# Patient Record
Sex: Female | Born: 1950
Health system: Southern US, Community
[De-identification: ages and names within clinical notes are randomized; demographics above are authoritative.]

## PROBLEM LIST (undated history)

## (undated) DIAGNOSIS — K859 Acute pancreatitis without necrosis or infection, unspecified: Secondary | ICD-10-CM

## (undated) DIAGNOSIS — I10 Essential (primary) hypertension: Secondary | ICD-10-CM

## (undated) DIAGNOSIS — G894 Chronic pain syndrome: Secondary | ICD-10-CM

## (undated) DIAGNOSIS — F329 Major depressive disorder, single episode, unspecified: Secondary | ICD-10-CM

## (undated) DIAGNOSIS — R519 Headache, unspecified: Secondary | ICD-10-CM

## (undated) DIAGNOSIS — R609 Edema, unspecified: Secondary | ICD-10-CM

## (undated) DIAGNOSIS — E559 Vitamin D deficiency, unspecified: Secondary | ICD-10-CM

## (undated) DIAGNOSIS — M545 Low back pain, unspecified: Secondary | ICD-10-CM

## (undated) DIAGNOSIS — I700 Atherosclerosis of aorta: Secondary | ICD-10-CM

## (undated) DIAGNOSIS — R1011 Right upper quadrant pain: Secondary | ICD-10-CM

## (undated) DIAGNOSIS — R5383 Other fatigue: Secondary | ICD-10-CM

## (undated) DIAGNOSIS — J40 Bronchitis, not specified as acute or chronic: Secondary | ICD-10-CM

## (undated) DIAGNOSIS — M797 Fibromyalgia: Secondary | ICD-10-CM

## (undated) DIAGNOSIS — R4182 Altered mental status, unspecified: Secondary | ICD-10-CM

## (undated) DIAGNOSIS — E876 Hypokalemia: Secondary | ICD-10-CM

## (undated) DIAGNOSIS — F419 Anxiety disorder, unspecified: Secondary | ICD-10-CM

## (undated) DIAGNOSIS — G629 Polyneuropathy, unspecified: Secondary | ICD-10-CM

## (undated) DIAGNOSIS — K219 Gastro-esophageal reflux disease without esophagitis: Secondary | ICD-10-CM

## (undated) DIAGNOSIS — J209 Acute bronchitis, unspecified: Secondary | ICD-10-CM

## (undated) DIAGNOSIS — M47816 Spondylosis without myelopathy or radiculopathy, lumbar region: Secondary | ICD-10-CM

## (undated) DIAGNOSIS — R51 Headache: Secondary | ICD-10-CM

## (undated) DIAGNOSIS — F32A Depression, unspecified: Secondary | ICD-10-CM

## (undated) DIAGNOSIS — M25559 Pain in unspecified hip: Secondary | ICD-10-CM

## (undated) DIAGNOSIS — G4700 Insomnia, unspecified: Secondary | ICD-10-CM

## (undated) DIAGNOSIS — G8929 Other chronic pain: Secondary | ICD-10-CM

## (undated) HISTORY — PX: OTHER SURGICAL HISTORY: SHX169

## (undated) HISTORY — DX: Low back pain: M54.5

## (undated) HISTORY — DX: Anxiety disorder, unspecified: F41.9

## (undated) HISTORY — PX: LAPAROSCOPIC VAGINAL HYSTERECTOMY WITH SALPINGO OOPHORECTOMY: SHX6681

## (undated) HISTORY — DX: Acute bronchitis, unspecified: J20.9

## (undated) HISTORY — DX: Other fatigue: R53.83

## (undated) HISTORY — DX: Vitamin D deficiency, unspecified: E55.9

## (undated) HISTORY — PX: BREAST BIOPSY: SHX20

## (undated) HISTORY — DX: Right upper quadrant pain: R10.11

## (undated) HISTORY — PX: ECTOPIC PREGNANCY SURGERY: SHX613

## (undated) HISTORY — PX: ABDOMINAL HYSTERECTOMY: SHX81

## (undated) HISTORY — DX: Fibromyalgia: M79.7

## (undated) HISTORY — DX: Acute pancreatitis without necrosis or infection, unspecified: K85.90

## (undated) HISTORY — DX: Low back pain, unspecified: M54.50

## (undated) HISTORY — DX: Insomnia, unspecified: G47.00

## (undated) HISTORY — DX: Depression, unspecified: F32.A

## (undated) HISTORY — DX: Major depressive disorder, single episode, unspecified: F32.9

## (undated) HISTORY — PX: LUMBAR FUSION: SHX111

## (undated) HISTORY — DX: Edema, unspecified: R60.9

## (undated) HISTORY — DX: Gastro-esophageal reflux disease without esophagitis: K21.9

## (undated) HISTORY — PX: OOPHORECTOMY: SHX86

## (undated) HISTORY — PX: BREAST SURGERY: SHX581

## (undated) HISTORY — DX: Headache, unspecified: R51.9

## (undated) HISTORY — PX: CATARACT EXTRACTION, BILATERAL: SHX1313

## (undated) HISTORY — PX: TOTAL HIP ARTHROPLASTY: SHX124

## (undated) HISTORY — DX: Chronic pain syndrome: G89.4

## (undated) HISTORY — DX: Pain in unspecified hip: M25.559

## (undated) HISTORY — DX: Headache: R51

## (undated) HISTORY — PX: HIP SURGERY: SHX245

## (undated) HISTORY — DX: Essential (primary) hypertension: I10

## (undated) HISTORY — PX: TOTAL HIP REVISION: SHX763

## (undated) HISTORY — DX: Bronchitis, not specified as acute or chronic: J40

## (undated) HISTORY — DX: Other chronic pain: G89.29

## (undated) HISTORY — PX: BACK SURGERY: SHX140

## (undated) HISTORY — DX: Altered mental status, unspecified: R41.82

---

## 2000-01-18 ENCOUNTER — Ambulatory Visit (HOSPITAL_COMMUNITY): Admission: RE | Admit: 2000-01-18 | Discharge: 2000-01-19 | Payer: Self-pay | Admitting: Neurosurgery

## 2000-01-18 ENCOUNTER — Encounter: Payer: Self-pay | Admitting: Neurosurgery

## 2001-08-26 ENCOUNTER — Encounter: Payer: Self-pay | Admitting: Neurosurgery

## 2001-08-26 ENCOUNTER — Ambulatory Visit: Admission: RE | Admit: 2001-08-26 | Discharge: 2001-08-26 | Payer: Self-pay

## 2002-07-16 ENCOUNTER — Encounter: Admission: RE | Admit: 2002-07-16 | Discharge: 2002-07-16 | Payer: Self-pay | Admitting: Neurosurgery

## 2002-07-16 ENCOUNTER — Encounter: Payer: Self-pay | Admitting: Neurosurgery

## 2002-08-18 ENCOUNTER — Encounter: Payer: Self-pay | Admitting: Neurosurgery

## 2002-08-20 ENCOUNTER — Encounter: Payer: Self-pay | Admitting: Neurosurgery

## 2002-08-20 ENCOUNTER — Inpatient Hospital Stay (HOSPITAL_COMMUNITY): Admission: RE | Admit: 2002-08-20 | Discharge: 2002-08-24 | Payer: Self-pay | Admitting: Neurosurgery

## 2005-10-31 ENCOUNTER — Ambulatory Visit: Payer: Self-pay | Admitting: Rheumatology

## 2005-11-29 ENCOUNTER — Ambulatory Visit: Payer: Self-pay | Admitting: Unknown Physician Specialty

## 2006-02-18 ENCOUNTER — Encounter: Payer: Self-pay | Admitting: Orthopedic Surgery

## 2006-03-17 ENCOUNTER — Encounter: Payer: Self-pay | Admitting: Orthopedic Surgery

## 2006-10-21 ENCOUNTER — Ambulatory Visit: Payer: Self-pay | Admitting: Rheumatology

## 2007-02-07 ENCOUNTER — Ambulatory Visit: Payer: Self-pay | Admitting: Unknown Physician Specialty

## 2007-03-04 ENCOUNTER — Emergency Department: Payer: Self-pay | Admitting: Emergency Medicine

## 2009-03-08 ENCOUNTER — Ambulatory Visit: Payer: Self-pay | Admitting: Unknown Physician Specialty

## 2009-04-22 ENCOUNTER — Emergency Department: Payer: Self-pay | Admitting: Emergency Medicine

## 2009-05-10 ENCOUNTER — Ambulatory Visit: Payer: Self-pay | Admitting: Unknown Physician Specialty

## 2009-05-11 ENCOUNTER — Inpatient Hospital Stay: Payer: Self-pay | Admitting: Unknown Physician Specialty

## 2009-05-30 ENCOUNTER — Inpatient Hospital Stay: Payer: Self-pay | Admitting: Internal Medicine

## 2009-08-23 ENCOUNTER — Ambulatory Visit: Payer: Self-pay | Admitting: Unknown Physician Specialty

## 2009-08-25 ENCOUNTER — Inpatient Hospital Stay: Payer: Self-pay | Admitting: Unknown Physician Specialty

## 2009-09-22 ENCOUNTER — Encounter: Payer: Self-pay | Admitting: General Practice

## 2009-10-17 ENCOUNTER — Encounter: Payer: Self-pay | Admitting: General Practice

## 2009-10-25 ENCOUNTER — Emergency Department: Payer: Self-pay | Admitting: Emergency Medicine

## 2009-11-16 ENCOUNTER — Encounter: Payer: Self-pay | Admitting: General Practice

## 2010-03-01 ENCOUNTER — Emergency Department: Payer: Self-pay | Admitting: Emergency Medicine

## 2010-03-03 ENCOUNTER — Emergency Department: Payer: Self-pay | Admitting: Emergency Medicine

## 2010-05-03 ENCOUNTER — Ambulatory Visit: Payer: Self-pay | Admitting: Gastroenterology

## 2010-05-03 LAB — HM COLONOSCOPY

## 2010-05-08 LAB — HM COLONOSCOPY

## 2010-06-06 ENCOUNTER — Emergency Department: Payer: Self-pay | Admitting: Emergency Medicine

## 2010-10-05 ENCOUNTER — Emergency Department: Payer: Self-pay | Admitting: Emergency Medicine

## 2010-11-06 ENCOUNTER — Emergency Department: Payer: Self-pay | Admitting: Emergency Medicine

## 2011-04-24 ENCOUNTER — Ambulatory Visit: Payer: Self-pay

## 2011-04-26 ENCOUNTER — Ambulatory Visit: Payer: Self-pay

## 2011-05-15 ENCOUNTER — Ambulatory Visit: Payer: Self-pay

## 2011-06-15 ENCOUNTER — Ambulatory Visit: Payer: Self-pay | Admitting: Anesthesiology

## 2011-06-21 ENCOUNTER — Ambulatory Visit: Payer: Self-pay | Admitting: Unknown Physician Specialty

## 2011-09-14 LAB — HM MAMMOGRAPHY

## 2012-03-17 DIAGNOSIS — M25559 Pain in unspecified hip: Secondary | ICD-10-CM | POA: Insufficient documentation

## 2012-04-22 DIAGNOSIS — T84099A Other mechanical complication of unspecified internal joint prosthesis, initial encounter: Secondary | ICD-10-CM | POA: Insufficient documentation

## 2013-02-25 ENCOUNTER — Ambulatory Visit: Payer: Self-pay

## 2013-08-28 ENCOUNTER — Other Ambulatory Visit: Payer: Self-pay | Admitting: Neurosurgery

## 2013-08-28 DIAGNOSIS — M5416 Radiculopathy, lumbar region: Secondary | ICD-10-CM

## 2013-09-09 ENCOUNTER — Ambulatory Visit
Admission: RE | Admit: 2013-09-09 | Discharge: 2013-09-09 | Disposition: A | Discharge status: Home / Self Care | Payer: Medicare Other | Source: Ambulatory Visit | Attending: Neurosurgery | Admitting: Neurosurgery

## 2013-09-09 DIAGNOSIS — M5416 Radiculopathy, lumbar region: Secondary | ICD-10-CM

## 2013-09-09 MED ORDER — GADOBENATE DIMEGLUMINE 529 MG/ML IV SOLN
20.0000 mL | Freq: Once | INTRAVENOUS | Status: AC | PRN
  Administered 2013-09-09: 20 mL via INTRAVENOUS

## 2013-11-24 ENCOUNTER — Ambulatory Visit: Payer: Self-pay | Admitting: Family Medicine

## 2014-03-02 ENCOUNTER — Ambulatory Visit: Payer: Self-pay | Admitting: Obstetrics and Gynecology

## 2014-03-02 LAB — BASIC METABOLIC PANEL
Anion Gap: 3 — ABNORMAL LOW (ref 7–16)
BUN: 5 mg/dL — AB (ref 7–18)
CHLORIDE: 104 mmol/L (ref 98–107)
CO2: 30 mmol/L (ref 21–32)
Calcium, Total: 8.5 mg/dL (ref 8.5–10.1)
Creatinine: 0.83 mg/dL (ref 0.60–1.30)
Glucose: 88 mg/dL (ref 65–99)
OSMOLALITY: 270 (ref 275–301)
POTASSIUM: 3.2 mmol/L — AB (ref 3.5–5.1)
Sodium: 137 mmol/L (ref 136–145)

## 2014-03-02 LAB — HEMOGLOBIN: HGB: 11.9 g/dL — AB (ref 12.0–16.0)

## 2014-03-08 ENCOUNTER — Ambulatory Visit: Payer: Self-pay | Admitting: Obstetrics and Gynecology

## 2014-03-09 LAB — HEMOGLOBIN: HGB: 10.3 g/dL — AB (ref 12.0–16.0)

## 2014-03-10 LAB — PATHOLOGY REPORT

## 2014-12-11 ENCOUNTER — Emergency Department: Payer: Self-pay | Admitting: Emergency Medicine

## 2015-04-09 NOTE — Op Note (Signed)
PATIENT NAME:  Maria Hamilton, Maria Hamilton MR#:  [id number] DATE OF BIRTH:  [birth year]  DATE OF PROCEDURE:  03/08/2014  PREOPERATIVE DIAGNOSIS: Postmenopausal bleeding.   POSTOPERATIVE DIAGNOSIS:  Postmenopausal bleeding.   OPERATION:  Total vaginal hysterectomy and left salpingectomy.   ANESTHESIA: General.   SURGEON: Lechelle Wrigley S. Marcelline Mates, MD.    Terrence DupontAlanda Slim. DeFrancesco, MD and Barron Alvine, PA student.   ESTIMATED BLOOD LOSS: 350 mL.   OPERATIVE FLUIDS: 1600 mL.   URINE OUTPUT:  100 mL.   COMPLICATIONS: None.   FINDINGS: Uterus approximately 6 weeks size, unable to visualize right fallopian tube and ovary if present, visualized the left tube and ovary.   SPECIMENS: Include left tube and uterus.   CONDITION: Stable.   PROCEDURE: The patient was taken to the Operating Room where she was placed under general anesthesia without difficulty. She was then prepped and draped in the normal sterile fashion and a Foley catheter was placed. Next, a weighted speculum was then placed into the vagina and the cervix was grasped using a double-tooth tenaculum. The cervix was then circumferentially incised using the Bovie tip.  The posterior cul-de-sac was then entered sharply with Mayo scissors without difficulty. The same procedure was performed anteriorly and the bladder was dissected off the pubovesical fascia.   At this point, a Heaney clamp was placed over the uterosacral ligaments on either side. These were then transected and suture ligated with 0 Vicryl. Hemostasis was assured.  The cardinal ligaments were then clamped on both sides, transected, and suture ligated in a similar fashion. The uterine arteries and the broad ligament were then serially clamped with Heaney clamps, transected, and suture ligated on both sides. Excellent hemostasis was noted. Both cornua were clamped with Heaney clamps, transected, and the uterus was delivered. These pedicles were then suture ligated with excellent  hemostasis.  The left fallopian tube was able to be identified along with the ovary.  The left fallopian tube was then transected using a Heaney clamp, cut and suture ligated.  Difficulty was noted at time of attempt to remove the left ovary and so the left ovary was left in the pelvis as it was difficult to isolate with minimal descensus.  We were unable to visualize if the right fallopian tube and ovary were present. They were not visualized during the hysterectomy procedure. The patient does report a history of right salpingectomy and possible right oophorectomy secondary to an ectopic pregnancy in the past.  After this, the peritoneum was then sutured to the posterior vaginal cuff in a running fashion using 0 Vicryl. Next, the remainder of the vaginal cuff was then closed with figure-of-eight stitches of 0 Vicryl. The uterosacral and cardinal ligaments were then transfixed in the midline.   After this, all instruments were then removed from the vagina. The patient was taken out of dorsolithotomy position and awakened from general anesthesia.  She was taken to the PACU in stable condition. Sponge, lap, needle and instrument count was correct x2 at the end of the procedure.     ____________________________ Chesley Noon. Marcelline Mates, MD asc:by D: 03/08/2014 21:47:10 ET T: 03/08/2014 22:54:36 ET JOB#: [id number]  cc: Chesley Noon. Marcelline Mates, MD, <Dictator> Augusto Gamble MD ELECTRONICALLY SIGNED 03/22/2014 9:09

## 2015-05-20 ENCOUNTER — Telehealth: Payer: Self-pay | Admitting: Unknown Physician Specialty

## 2015-05-20 NOTE — Telephone Encounter (Signed)
I do not prescribe Tussionex without an office visit

## 2015-05-20 NOTE — Telephone Encounter (Signed)
Pt called stated she still has a bad cough. Wants to know is you can call in Tussinex for her. [facility] [place].

## 2015-05-20 NOTE — Telephone Encounter (Signed)
I don't call in tussionex without a visit.

## 2015-05-23 ENCOUNTER — Telehealth: Payer: Self-pay | Admitting: Unknown Physician Specialty

## 2015-05-23 MED ORDER — LYRICA 225 MG PO CAPS: 225.0000 mg | ORAL_CAPSULE | Freq: Two times a day (BID) | ORAL | Status: DC

## 2015-05-23 NOTE — Telephone Encounter (Signed)
It is in the chart and cannot be faxed.  Pt will need to come in and pick up

## 2015-05-23 NOTE — Telephone Encounter (Signed)
Pt called states she needs a refill on Lyrica. Pt states she is out of this medication. [facility] [place]. Thanks.

## 2015-05-27 ENCOUNTER — Other Ambulatory Visit: Payer: Self-pay | Admitting: Specialist

## 2015-05-27 DIAGNOSIS — M5136 Other intervertebral disc degeneration, lumbar region: Secondary | ICD-10-CM

## 2015-05-28 ENCOUNTER — Other Ambulatory Visit: Payer: Self-pay | Admitting: Unknown Physician Specialty

## 2015-05-30 ENCOUNTER — Other Ambulatory Visit: Payer: Self-pay

## 2015-05-30 MED ORDER — AMLODIPINE BESY-BENAZEPRIL HCL 5-20 MG PO CAPS: 1.0000 | ORAL_CAPSULE | Freq: Every day | ORAL | Status: DC

## 2015-05-30 NOTE — Telephone Encounter (Signed)
Pharmacy would like rx changed to a 90 day supply. 

## 2015-06-03 ENCOUNTER — Ambulatory Visit
Admission: RE | Admit: 2015-06-03 | Discharge: 2015-06-03 | Disposition: A | Discharge status: Home / Self Care | Payer: Medicare Other | Source: Ambulatory Visit | Attending: Specialist | Admitting: Specialist

## 2015-06-03 DIAGNOSIS — M4326 Fusion of spine, lumbar region: Secondary | ICD-10-CM | POA: Insufficient documentation

## 2015-06-03 DIAGNOSIS — M4806 Spinal stenosis, lumbar region: Secondary | ICD-10-CM | POA: Insufficient documentation

## 2015-06-03 DIAGNOSIS — M47896 Other spondylosis, lumbar region: Secondary | ICD-10-CM | POA: Insufficient documentation

## 2015-06-03 DIAGNOSIS — M5136 Other intervertebral disc degeneration, lumbar region: Secondary | ICD-10-CM | POA: Insufficient documentation

## 2015-06-03 MED ORDER — GADOBENATE DIMEGLUMINE 529 MG/ML IV SOLN
20.0000 mL | Freq: Once | INTRAVENOUS | Status: AC | PRN
  Administered 2015-06-03: 20 mL via INTRAVENOUS

## 2015-06-03 MED ORDER — GADOBENATE DIMEGLUMINE 529 MG/ML IV SOLN: 20.0000 mL | Freq: Once | INTRAVENOUS | Status: AC | PRN

## 2015-06-23 ENCOUNTER — Other Ambulatory Visit: Payer: Self-pay

## 2015-06-23 ENCOUNTER — Telehealth: Payer: Self-pay | Admitting: Unknown Physician Specialty

## 2015-06-23 NOTE — Telephone Encounter (Signed)
Unfortunately, even though Malachy Mood has been seeing her for these issues for many years, there is not a note in PP specifically about these medicines going back for many years. I will need to see her to get these medicines refilled, or she can wait until Malachy Mood comes back if she is only taking them occasionally- which from the looks of when they were last filled is likely the case.

## 2015-06-23 NOTE — Telephone Encounter (Signed)
Patient was last seen on 05/04/15 and next appointment is 08/03/15. Practice partner number is [phone] and [facility] [place].

## 2015-06-23 NOTE — Telephone Encounter (Signed)
E-Fax came through for refill: Rx: Zolpidem tartrate 10mg  Copy of Rx in basket

## 2015-06-24 NOTE — Telephone Encounter (Signed)
Tried to call patient at the only number provided in practice partner and epic but the number has been disconnected.

## 2015-06-27 ENCOUNTER — Telehealth: Payer: Self-pay

## 2015-06-27 MED ORDER — CLONAZEPAM 0.5 MG PO TABS: 0.5000 mg | ORAL_TABLET | Freq: Three times a day (TID) | ORAL | Status: DC | PRN

## 2015-06-27 MED ORDER — ZOLPIDEM TARTRATE 5 MG PO TABS: 5.0000 mg | ORAL_TABLET | Freq: Every evening | ORAL | Status: DC | PRN

## 2015-06-27 NOTE — Telephone Encounter (Signed)
Pt called stated she has a pre-op on Friday and needs a refill on her Klonopin and zolpidem. Pt stated pharmacy instructed her to call us. [facility] [place]. Could you ask one of the other providers to sign off on this as Malachy Mood is out of the office and pt is completely out of her medication and has been out for 3 days. Thanks.

## 2015-06-27 NOTE — Telephone Encounter (Signed)
Recommended dose is five milligrams; new Rx entered Limited Rx provided in usually provider's absence

## 2015-06-27 NOTE — Telephone Encounter (Signed)
Routing to provider  

## 2015-06-28 NOTE — Telephone Encounter (Signed)
Pt called stated the pharmacy stated they did not receive refill request for pt's Klonopin & Zolpidem. [facility] [place].

## 2015-06-28 NOTE — Telephone Encounter (Signed)
Looking back into EPIC, it looks like the prescriptions were printed instead of being sent straight to the pharmacy. Will route to provider and see if we can get them sent straight to the pharmacy.

## 2015-06-29 NOTE — Telephone Encounter (Signed)
I personally faxed them confirmed them the day they were printed They cannot be sent straight from Epic because they require a wet signature I called the pharmacy personally Mickel Baas says that not only did they get my prescription, but they filled them both and they were both picked up around 4:47 pm yesterday

## 2015-07-01 ENCOUNTER — Telehealth: Payer: Self-pay

## 2015-07-01 ENCOUNTER — Other Ambulatory Visit: Payer: Self-pay | Admitting: Family Medicine

## 2015-07-01 MED ORDER — CLONAZEPAM 0.5 MG PO TABS: 0.5000 mg | ORAL_TABLET | Freq: Three times a day (TID) | ORAL | Status: DC | PRN

## 2015-07-01 MED ORDER — ZOLPIDEM TARTRATE 10 MG PO TABS: 10.0000 mg | ORAL_TABLET | Freq: Every evening | ORAL | Status: DC | PRN

## 2015-07-01 NOTE — Telephone Encounter (Signed)
Routing to provider  

## 2015-07-01 NOTE — Telephone Encounter (Signed)
Patient called to request a refill on Klonopin and Ambien.  Please call patient at [phone].  Note:  [facility] [place]

## 2015-08-03 ENCOUNTER — Ambulatory Visit: Payer: Medicare Other | Admitting: Unknown Physician Specialty

## 2015-08-03 DIAGNOSIS — R51 Headache: Secondary | ICD-10-CM

## 2015-08-03 DIAGNOSIS — K219 Gastro-esophageal reflux disease without esophagitis: Secondary | ICD-10-CM

## 2015-08-03 DIAGNOSIS — L509 Urticaria, unspecified: Secondary | ICD-10-CM | POA: Insufficient documentation

## 2015-08-03 DIAGNOSIS — M5442 Lumbago with sciatica, left side: Secondary | ICD-10-CM

## 2015-08-03 DIAGNOSIS — M545 Low back pain: Secondary | ICD-10-CM

## 2015-08-03 DIAGNOSIS — F419 Anxiety disorder, unspecified: Secondary | ICD-10-CM | POA: Insufficient documentation

## 2015-08-03 DIAGNOSIS — B9562 Methicillin resistant Staphylococcus aureus infection as the cause of diseases classified elsewhere: Secondary | ICD-10-CM

## 2015-08-03 DIAGNOSIS — M797 Fibromyalgia: Secondary | ICD-10-CM | POA: Insufficient documentation

## 2015-08-03 DIAGNOSIS — G8929 Other chronic pain: Secondary | ICD-10-CM | POA: Insufficient documentation

## 2015-08-03 DIAGNOSIS — R519 Headache, unspecified: Secondary | ICD-10-CM | POA: Insufficient documentation

## 2015-08-03 DIAGNOSIS — E559 Vitamin D deficiency, unspecified: Secondary | ICD-10-CM

## 2015-08-03 DIAGNOSIS — L0390 Cellulitis, unspecified: Secondary | ICD-10-CM

## 2015-08-03 DIAGNOSIS — I10 Essential (primary) hypertension: Secondary | ICD-10-CM | POA: Insufficient documentation

## 2015-08-03 DIAGNOSIS — B359 Dermatophytosis, unspecified: Secondary | ICD-10-CM | POA: Insufficient documentation

## 2015-08-03 DIAGNOSIS — F32A Depression, unspecified: Secondary | ICD-10-CM | POA: Insufficient documentation

## 2015-08-03 DIAGNOSIS — G894 Chronic pain syndrome: Secondary | ICD-10-CM | POA: Insufficient documentation

## 2015-08-03 DIAGNOSIS — N939 Abnormal uterine and vaginal bleeding, unspecified: Secondary | ICD-10-CM

## 2015-08-03 DIAGNOSIS — G4700 Insomnia, unspecified: Secondary | ICD-10-CM | POA: Insufficient documentation

## 2015-08-03 DIAGNOSIS — F329 Major depressive disorder, single episode, unspecified: Secondary | ICD-10-CM | POA: Insufficient documentation

## 2015-08-03 DIAGNOSIS — R768 Other specified abnormal immunological findings in serum: Secondary | ICD-10-CM | POA: Insufficient documentation

## 2015-08-03 DIAGNOSIS — R5382 Chronic fatigue, unspecified: Secondary | ICD-10-CM | POA: Insufficient documentation

## 2015-08-03 HISTORY — DX: Anxiety disorder, unspecified: F41.9

## 2015-08-08 ENCOUNTER — Other Ambulatory Visit: Payer: Self-pay

## 2015-08-08 MED ORDER — PAROXETINE HCL 20 MG PO TABS: 20.0000 mg | ORAL_TABLET | Freq: Every day | ORAL | Status: DC

## 2015-08-08 NOTE — Telephone Encounter (Signed)
Patient was last seen 05/04/15, practice partner number is [phone], and [facility]. Pharmacy is requesting a 90 day supply.

## 2015-09-27 ENCOUNTER — Encounter: Payer: Self-pay | Admitting: Emergency Medicine

## 2015-09-27 ENCOUNTER — Emergency Department
Admission: EM | Admit: 2015-09-27 | Discharge: 2015-09-27 | Disposition: A | Discharge status: Home / Self Care | Payer: Medicare Other | Attending: Emergency Medicine | Admitting: Emergency Medicine

## 2015-09-27 DIAGNOSIS — T783XXA Angioneurotic edema, initial encounter: Secondary | ICD-10-CM | POA: Diagnosis not present

## 2015-09-27 DIAGNOSIS — Z880 Allergy status to penicillin: Secondary | ICD-10-CM | POA: Insufficient documentation

## 2015-09-27 DIAGNOSIS — Z7982 Long term (current) use of aspirin: Secondary | ICD-10-CM | POA: Diagnosis not present

## 2015-09-27 DIAGNOSIS — Z79899 Other long term (current) drug therapy: Secondary | ICD-10-CM | POA: Insufficient documentation

## 2015-09-27 DIAGNOSIS — I10 Essential (primary) hypertension: Secondary | ICD-10-CM | POA: Insufficient documentation

## 2015-09-27 DIAGNOSIS — T464X5A Adverse effect of angiotensin-converting-enzyme inhibitors, initial encounter: Secondary | ICD-10-CM | POA: Diagnosis not present

## 2015-09-27 DIAGNOSIS — R220 Localized swelling, mass and lump, head: Secondary | ICD-10-CM | POA: Diagnosis present

## 2015-09-27 MED ORDER — CYPROHEPTADINE HCL 4 MG PO TABS
4.0000 mg | ORAL_TABLET | Freq: Once | ORAL | Status: AC
  Administered 2015-09-27: 4 mg via ORAL
  Filled 2015-09-27: qty 1

## 2015-09-27 MED ORDER — FAMOTIDINE 20 MG PO TABS
40.0000 mg | ORAL_TABLET | Freq: Once | ORAL | Status: AC
  Administered 2015-09-27: 40 mg via ORAL
  Filled 2015-09-27: qty 2

## 2015-09-27 NOTE — ED Notes (Signed)
Pt states she woke up this am with swelling to lips and face with red rash, states she has not eat anything new or taken any new meds, no breathing difficulty

## 2015-09-27 NOTE — ED Provider Notes (Signed)
[facility] Emergency Department Provider Note ____________________________________________  Time seen: 1533  I have reviewed the triage vital signs and the nursing notes.  HISTORY  Chief Complaint  Facial Swelling  HPI Maria Hamilton is a 64 y.o. female ports to the ED for evaluation of sudden onset of swelling to the lip and face this morning. She just denies any known history of allergy to food, medications or exposures. She denies any difficulty swallowing, breathing, or any sign swelling to her tongue or throat. She does note that she has taken her routine medications which include amlodipine-benazepril, with her last dose this morning. Outside of that she did not report any recent fevers, chills, sweats, difficulty breathing, or rash. She denies any current itching and irritation to the skin. She describes the upper lip and cheeks as tight. She denies any pain rating it at a 0/10 in triage.  Past Medical History  Diagnosis Date  . Edema   . Headache   . GERD (gastroesophageal reflux disease)   . Anxiety   . Hypertension   . Fatigue   . Vitamin D deficiency   . Insomnia   . Fibromyalgia   . Low back pain   . Chronic pain syndrome   . Depression   . Chronic hip pain     Patient Active Problem List   Diagnosis Date Noted  . GERD (gastroesophageal reflux disease) 08/03/2015  . Headache 08/03/2015  . Acute anxiety 08/03/2015  . Rheumatoid factor positive 08/03/2015  . Fatigue 08/03/2015  . Vitamin D deficiency 08/03/2015  . Insomnia 08/03/2015  . Fibromyalgia 08/03/2015  . Low back pain 08/03/2015  . Chronic pain syndrome 08/03/2015  . Depression 08/03/2015  . Abnormal vaginal bleeding 08/03/2015  . Hypertension 08/03/2015  . Cellulitis due to MRSA 08/03/2015  . Urticaria 08/03/2015  . Tinea 08/03/2015    Past Surgical History  Procedure Laterality Date  . Breast surgery    . Back surgery    . Ectopic pregnancy surgery    . Hip  surgery      x4  . Abdominal hysterectomy    . Cyst removal from wrist      Current Outpatient Rx  Name  Route  Sig  Dispense  Refill  . amLODipine-benazepril (LOTREL) 5-20 MG per capsule   Oral   Take 1 capsule by mouth daily.   90 capsule   3   . aspirin EC 325 MG tablet      TAKE 1 TABLET BY MOUTH TWICE A DAY AS DIRECTED      0   . clonazePAM (KLONOPIN) 0.5 MG tablet   Oral   Take 1 tablet (0.5 mg total) by mouth 3 (three) times daily as needed for anxiety. Do not take within six hours of zolpidem   90 tablet   1   . hydrochlorothiazide (HYDRODIURIL) 25 MG tablet   Oral   Take 25 mg by mouth daily.      11   . hydrOXYzine (ATARAX/VISTARIL) 25 MG tablet   Oral   Take 25 mg by mouth every 6 (six) hours as needed.         Marland Kitchen LYRICA 225 MG capsule   Oral   Take 1 capsule (225 mg total) by mouth 2 (two) times daily.   60 capsule   6     Dispense as written.   Marland Kitchen PARoxetine (PAXIL) 20 MG tablet   Oral   Take 1 tablet (20 mg total) by mouth daily.  90 tablet   1   . zolpidem (AMBIEN) 10 MG tablet   Oral   Take 10 mg by mouth at bedtime as needed for sleep.          Allergies Penicillin g benzathine and Tape  Family History  Problem Relation Age of Onset  . Heart disease Mother   . Cancer Father   . Alcohol abuse Father     Social History Social History  Substance Use Topics  . Smoking status: Never Smoker   . Smokeless tobacco: None  . Alcohol Use: No   Review of Systems  Constitutional: Negative for fever. Eyes: Negative for visual changes. ENT: Negative for sore throat. Swelling of the upper lip, and midface as above. Cardiovascular: Negative for chest pain. Respiratory: Negative for shortness of breath. Gastrointestinal: Negative for abdominal pain, vomiting and diarrhea. Genitourinary: Negative for dysuria. Musculoskeletal: Negative for back pain. Skin: Negative for rash. Neurological: Negative for headaches, focal weakness or  numbness. ____________________________________________  PHYSICAL EXAM:  VITAL SIGNS: ED Triage Vitals  Enc Vitals Group     BP 09/27/15 1521 162/124 mmHg     Pulse Rate 09/27/15 1521 82     Resp 09/27/15 1521 18     Temp 09/27/15 1521 98.4 F (36.9 C)     Temp Source 09/27/15 1521 Oral     SpO2 09/27/15 1521 100 %     Weight 09/27/15 1521 235 lb (106.595 kg)     Height 09/27/15 1521 6\' 1"  (1.854 m)     Head Cir --      Peak Flow --      Pain Score 09/27/15 1521 0     Pain Loc --      Pain Edu? --      Excl. in [place]? --    Constitutional: Alert and oriented. Well appearing and in no distress. Head: Normocephalic and atraumatic.      Eyes: Conjunctivae are normal. PERRL. Normal extraocular movements      Ears: Canals clear. TMs intact bilaterally.   Nose: No congestion/rhinorrhea.   Mouth/Throat: Mucous membranes are moist. Upper lip is symmetrically full, uvula is midline, tonsils are flat and tongue is without edema. Patient is to have symmetric edema extending from the upper lip to the malar cheeks bilaterally.   Neck: Supple. No thyromegaly. Hematological/Lymphatic/Immunological: No cervical lymphadenopathy. Cardiovascular: Normal rate, regular rhythm.  Respiratory: Normal respiratory effort. No wheezes/rales/rhonchi. Gastrointestinal: Soft and nontender. No distention. Musculoskeletal: Nontender with normal range of motion in all extremities.  Neurologic:  Normal gait without ataxia. Normal speech and language. No gross focal neurologic deficits are appreciated. Skin:  Skin is warm, dry and intact. No rash noted. Psychiatric: Mood and affect are normal. Patient exhibits appropriate insight and judgment. ____________________________________________  PROCEDURES  Famotidine 40 mg PO Periactin 4 mg PO ____________________________________________  INITIAL IMPRESSION / ASSESSMENT AND PLAN / ED COURSE  Child with a clinical presentation consistent with a ACE  inhibitor-mediated angioedema. She will be advised to discontinue dosing her Lotrel, and follow with her primary care provider by phone tomorrow. She is also given instructions and information on the self-limited course of an angioedema associated with Ace inhibitors. She will dose her home prescription of Atarax as needed, and return to the ED. ____________________________________________  FINAL CLINICAL IMPRESSION(S) / ED DIAGNOSES  Final diagnoses:  ACE inhibitor-aggravated angioedema, initial encounter      Melvenia Needles, PA-C 09/27/15 [place], MD 09/27/15 1929

## 2015-09-27 NOTE — Discharge Instructions (Signed)
Angioedema °Angioedema is a sudden swelling of tissues, often of the skin. It can occur on the face or genitals or in the abdomen or other body parts. The swelling usually develops over a short period and gets better in 24 to 48 hours. It often begins during the night and is found when the person wakes up. The person may also get red, itchy patches of skin (hives). Angioedema can be dangerous if it involves swelling of the air passages.  °Depending on the cause, episodes of angioedema may only happen once, come back in unpredictable patterns, or repeat for several years and then gradually fade away.  °CAUSES  °Angioedema can be caused by an allergic reaction to various triggers. It can also result from nonallergic causes, including reactions to drugs, immune system disorders, viral infections, or an abnormal gene that is passed to you from your parents (hereditary). For some people with angioedema, the cause is unknown.  °Some things that can trigger angioedema include:  °· Foods.   °· Medicines, such as ACE inhibitors, ARBs, nonsteroidal anti-inflammatory agents, or estrogen.   °· Latex.   °· Animal saliva.   °· Insect stings.   °· Dyes used in X-rays.   °· Mild injury.   °· Dental work. °· Surgery. °· Stress.   °· Sudden changes in temperature.   °· Exercise. °SIGNS AND SYMPTOMS  °· Swelling of the skin. °· Hives. If these are present, there is also intense itching. °· Redness in the affected area.   °· Pain in the affected area. °· Swollen lips or tongue. °· Breathing problems. This may happen if the air passages swell. °· Wheezing. °If internal organs are involved, there may be:  °· Nausea.   °· Abdominal pain.   °· Vomiting.   °· Difficulty swallowing.   °· Difficulty passing urine. °DIAGNOSIS  °· Your health care provider will examine the affected area and take a medical and family history. °· Various tests may be done to help determine the cause. Tests may include: °¨ Allergy skin tests to see if the problem  is an allergic reaction.   °¨ Blood tests to check for hereditary angioedema.   °¨ Tests to check for underlying diseases that could cause the condition.   °· A review of your medicines, including over-the-counter medicines, may be done. °TREATMENT  °Treatment will depend on the cause of the angioedema. Possible treatments include:  °· Removal of anything that triggered the condition (such as stopping certain medicines).   °· Medicines to treat symptoms or prevent attacks. Medicines given may include:   °¨ Antihistamines.   °¨ Epinephrine injection.   °¨ Steroids.   °· Hospitalization may be required for severe attacks. If the air passages are affected, it can be an emergency. Tubes may need to be placed to keep the airway open. °HOME CARE INSTRUCTIONS  °· Take all medicines as directed by your health care provider. °· If you were given medicines for emergency allergy treatment, always carry them with you. °· Wear a medical bracelet as directed by your health care provider.   °· Avoid known triggers. °SEEK MEDICAL CARE IF:  °· You have repeat attacks of angioedema.   °· Your attacks are more frequent or more severe despite preventive measures.   °· You have hereditary angioedema and are considering having children. It is important to discuss with your health care provider the risks of passing the condition on to your children. °SEEK IMMEDIATE MEDICAL CARE IF:  °· You have severe swelling of the mouth, tongue, or lips. °· You have difficulty breathing.   °· You have difficulty swallowing.   °· You faint. °MAKE   SURE YOU:  Understand these instructions.  Will watch your condition.  Will get help right away if you are not doing well or get worse.   This information is not intended to replace advice given to you by your health care provider. Make sure you discuss any questions you have with your health care provider.   Document Released: 02/11/2002 Document Revised: 12/24/2014 Document Reviewed:  07/27/2013 Elsevier Interactive Patient Education [employer].  Your exam is essentially normal. Your exam and medication history are consistent with a drug-related condition called ACE Inhibitor- mediated angioedema. You should stop taking your Lotrel immediately.  Call your provider for BP medicine change tomorrow. Return as needed for any difficulty with breathing or swallowing. Take your Vistaril as needed. Your symptoms should resolve spontaneously in a few days.

## 2015-09-30 ENCOUNTER — Telehealth: Payer: Self-pay | Admitting: Unknown Physician Specialty

## 2015-09-30 ENCOUNTER — Telehealth: Payer: Self-pay | Admitting: Emergency Medicine

## 2015-09-30 NOTE — ED Notes (Signed)
See previous note.  i called patient to let her know i called pcp, and she told me that she is no longer a pateint at [employer] as of about a month ago.  Say she is now going to [facility], and they told her they coul dnt see her until Wednesday.  i told her she could return to the er if needed in the mean time.  I told her that the evaluating md may or may not give her blood pressure meds based on her exam.  She says she will wait til her appt.

## 2015-09-30 NOTE — ED Notes (Signed)
Called patient as she left me a message saying her pcp will not see her to give her a different bp med.  pcp is listed as Pharmacist, hospital.  ic alled them and they will send message

## 2015-09-30 NOTE — Telephone Encounter (Signed)
Pt had an allergic reaction to amlodipine and needs another medication for her blood pressure.

## 2015-12-15 ENCOUNTER — Other Ambulatory Visit: Payer: Self-pay | Admitting: Orthopedic Surgery

## 2015-12-15 DIAGNOSIS — M5136 Other intervertebral disc degeneration, lumbar region: Secondary | ICD-10-CM

## 2016-01-06 ENCOUNTER — Ambulatory Visit
Admission: RE | Admit: 2016-01-06 | Discharge: 2016-01-06 | Disposition: A | Discharge status: Home / Self Care | Payer: Medicare Other | Source: Ambulatory Visit | Attending: Orthopedic Surgery | Admitting: Orthopedic Surgery

## 2016-01-06 DIAGNOSIS — Z981 Arthrodesis status: Secondary | ICD-10-CM | POA: Diagnosis not present

## 2016-01-06 DIAGNOSIS — M5136 Other intervertebral disc degeneration, lumbar region: Secondary | ICD-10-CM | POA: Insufficient documentation

## 2016-01-06 DIAGNOSIS — M4690 Unspecified inflammatory spondylopathy, site unspecified: Secondary | ICD-10-CM | POA: Diagnosis not present

## 2016-01-06 DIAGNOSIS — M5127 Other intervertebral disc displacement, lumbosacral region: Secondary | ICD-10-CM | POA: Diagnosis not present

## 2016-02-27 ENCOUNTER — Other Ambulatory Visit: Payer: Self-pay | Admitting: Unknown Physician Specialty

## 2016-02-27 NOTE — Telephone Encounter (Signed)
Needs an appointment. Will get her enough medicine to make it to appointment when it's booked.   

## 2016-02-27 NOTE — Telephone Encounter (Signed)
Called and left patient a voicemail on her emergency contact's phone number asking for her to please return my call. I called this number because the patient has asked to be called back on this number according to a previous phone message in patient's chart.

## 2016-02-29 ENCOUNTER — Telehealth: Payer: Self-pay | Admitting: Unknown Physician Specialty

## 2016-02-29 NOTE — Telephone Encounter (Signed)
Called patient's home number. A lady answered the phone and stated that the patient has been in the hospital and is supposed to come home today. I asked for her to please have the patient call me when she could.

## 2016-02-29 NOTE — Telephone Encounter (Signed)
Routing to provider. FYI.  

## 2016-02-29 NOTE — Telephone Encounter (Signed)
This refill encounter goes along with the telephone encounter.

## 2016-02-29 NOTE — Telephone Encounter (Signed)
Pt's husband called stated pt has not seen Malachy Mood in 6-8 months and her insurance is about to change and they don't think Malachy Mood is in network. Pt and husband informed that pt would not be able to receive any further refills on medications without having an appointment. Pt and husband stated "fine" and hung up. Thanks.

## 2016-03-20 ENCOUNTER — Other Ambulatory Visit: Payer: Self-pay | Admitting: Unknown Physician Specialty

## 2016-03-20 NOTE — Telephone Encounter (Signed)
Your patient 

## 2016-04-05 ENCOUNTER — Emergency Department: Payer: Medicare HMO

## 2016-04-05 ENCOUNTER — Emergency Department
Admission: EM | Admit: 2016-04-05 | Discharge: 2016-04-05 | Disposition: A | Discharge status: Home / Self Care | Payer: Medicare HMO | Attending: Emergency Medicine | Admitting: Emergency Medicine

## 2016-04-05 DIAGNOSIS — R410 Disorientation, unspecified: Secondary | ICD-10-CM | POA: Diagnosis not present

## 2016-04-05 DIAGNOSIS — F329 Major depressive disorder, single episode, unspecified: Secondary | ICD-10-CM | POA: Insufficient documentation

## 2016-04-05 DIAGNOSIS — Z79899 Other long term (current) drug therapy: Secondary | ICD-10-CM | POA: Insufficient documentation

## 2016-04-05 DIAGNOSIS — R6883 Chills (without fever): Secondary | ICD-10-CM

## 2016-04-05 DIAGNOSIS — I10 Essential (primary) hypertension: Secondary | ICD-10-CM | POA: Diagnosis not present

## 2016-04-05 DIAGNOSIS — Z7982 Long term (current) use of aspirin: Secondary | ICD-10-CM | POA: Diagnosis not present

## 2016-04-05 LAB — CBC WITH DIFFERENTIAL/PLATELET
BASOS ABS: 0.1 10*3/uL (ref 0–0.1)
Basophils Relative: 1 %
EOS PCT: 2 %
Eosinophils Absolute: 0.1 10*3/uL (ref 0–0.7)
HEMATOCRIT: 37.2 % (ref 35.0–47.0)
Hemoglobin: 12.2 g/dL (ref 12.0–16.0)
LYMPHS ABS: 1.9 10*3/uL (ref 1.0–3.6)
LYMPHS PCT: 36 %
MCH: 25.4 pg — AB (ref 26.0–34.0)
MCHC: 32.9 g/dL (ref 32.0–36.0)
MCV: 77.1 fL — AB (ref 80.0–100.0)
MONO ABS: 0.5 10*3/uL (ref 0.2–0.9)
MONOS PCT: 10 %
NEUTROS ABS: 2.7 10*3/uL (ref 1.4–6.5)
Neutrophils Relative %: 51 %
PLATELETS: 307 10*3/uL (ref 150–440)
RBC: 4.82 MIL/uL (ref 3.80–5.20)
RDW: 15.3 % — AB (ref 11.5–14.5)
WBC: 5.3 10*3/uL (ref 3.6–11.0)

## 2016-04-05 LAB — URINALYSIS COMPLETE WITH MICROSCOPIC (ARMC ONLY)
BILIRUBIN URINE: NEGATIVE
Bacteria, UA: NONE SEEN
GLUCOSE, UA: NEGATIVE mg/dL
HGB URINE DIPSTICK: NEGATIVE
KETONES UR: NEGATIVE mg/dL
LEUKOCYTES UA: NEGATIVE
Nitrite: NEGATIVE
Protein, ur: NEGATIVE mg/dL
RBC / HPF: NONE SEEN RBC/hpf (ref 0–5)
Specific Gravity, Urine: 1.016 (ref 1.005–1.030)
pH: 9 — ABNORMAL HIGH (ref 5.0–8.0)

## 2016-04-05 LAB — COMPREHENSIVE METABOLIC PANEL
ALT: 13 U/L — ABNORMAL LOW (ref 14–54)
ANION GAP: 14 (ref 5–15)
AST: 31 U/L (ref 15–41)
Albumin: 4.1 g/dL (ref 3.5–5.0)
Alkaline Phosphatase: 126 U/L (ref 38–126)
BILIRUBIN TOTAL: 1.1 mg/dL (ref 0.3–1.2)
BUN: 11 mg/dL (ref 6–20)
CHLORIDE: 102 mmol/L (ref 101–111)
CO2: 20 mmol/L — ABNORMAL LOW (ref 22–32)
Calcium: 9.5 mg/dL (ref 8.9–10.3)
Creatinine, Ser: 1 mg/dL (ref 0.44–1.00)
GFR, EST NON AFRICAN AMERICAN: 58 mL/min — AB (ref >60)
Glucose, Bld: 111 mg/dL — ABNORMAL HIGH (ref 65–99)
POTASSIUM: 3.8 mmol/L (ref 3.5–5.1)
Sodium: 136 mmol/L (ref 135–145)
TOTAL PROTEIN: 8.5 g/dL — AB (ref 6.5–8.1)

## 2016-04-05 LAB — TROPONIN I

## 2016-04-05 MED ORDER — SODIUM CHLORIDE 0.9 % IV BOLUS (SEPSIS)
1000.0000 mL | Freq: Once | INTRAVENOUS | Status: AC
  Administered 2016-04-05: 1000 mL via INTRAVENOUS

## 2016-04-05 MED ORDER — KETOROLAC TROMETHAMINE 30 MG/ML IJ SOLN: 30.0000 mg | Freq: Once | INTRAMUSCULAR | Status: DC

## 2016-04-05 NOTE — Discharge Instructions (Signed)
Stay hydrated.   Take tylenol, motrin for fever or chills.   See your doctor  Return to ER if you have worse chills, fever, vomiting, not behaving normally.

## 2016-04-05 NOTE — ED Notes (Signed)
Pt is here with her grandson, states yesterday she was slightly confused, states today she is worse, wrapping herself in a blanket when its 85 degrees in the house.. Pt states "I dont feel right".. Denies any pain.. States lives with her husband.Marland Kitchen

## 2016-04-05 NOTE — ED Provider Notes (Signed)
CSN: [id number]     Arrival date & time 04/05/16  1454 History   First MD Initiated Contact with Patient 04/05/16 1504     Chief Complaint  Patient presents with  . Altered Mental Status     (Consider location/radiation/quality/duration/timing/severity/associated sxs/prior Treatment) The history is provided by the patient and a relative.  Maria Hamilton is a 65 y.o. female hx of GERD, depression, fibromyalgia here with Confusion, chills. Patient states that she has been having chills for the last 2 days. She did not Drink much water yesterday so grandson encouraged her to drink some water. Today, she woke up with chills again. Denies any fevers and denies any vomiting or diarrhea. Denies any abdominal pain.      Level V caveat- confusion   Past Medical History  Diagnosis Date  . Edema   . Headache   . GERD (gastroesophageal reflux disease)   . Anxiety   . Hypertension   . Fatigue   . Vitamin D deficiency   . Insomnia   . Fibromyalgia   . Low back pain   . Chronic pain syndrome   . Depression   . Chronic hip pain    Past Surgical History  Procedure Laterality Date  . Breast surgery    . Back surgery    . Ectopic pregnancy surgery    . Hip surgery      x4  . Abdominal hysterectomy    . Cyst removal from wrist     Family History  Problem Relation Age of Onset  . Heart disease Mother   . Cancer Father   . Alcohol abuse Father    Social History  Substance Use Topics  . Smoking status: Never Smoker   . Smokeless tobacco: None  . Alcohol Use: No   OB History    No data available     Review of Systems  Constitutional: Positive for chills.  Psychiatric/Behavioral: Positive for confusion.  All other systems reviewed and are negative.     Allergies  Penicillin g benzathine and Tape  Home Medications   Prior to Admission medications   Medication Sig Start Date End Date Taking? Authorizing Provider  amLODipine-benazepril (LOTREL) 5-20 MG per capsule Take  1 capsule by mouth daily. 05/30/15   Kathrine Haddock, NP  aspirin EC 325 MG tablet TAKE 1 TABLET BY MOUTH TWICE A DAY AS DIRECTED 07/07/15   Historical Provider, MD  clonazePAM (KLONOPIN) 0.5 MG tablet Take 1 tablet (0.5 mg total) by mouth 3 (three) times daily as needed for anxiety. Do not take within six hours of zolpidem 07/01/15   Kathrine Haddock, NP  hydrochlorothiazide (HYDRODIURIL) 25 MG tablet Take 25 mg by mouth daily. 07/01/15   Historical Provider, MD  hydrOXYzine (ATARAX/VISTARIL) 25 MG tablet Take 25 mg by mouth every 6 (six) hours as needed.    Historical Provider, MD  LYRICA 225 MG capsule Take 1 capsule (225 mg total) by mouth 2 (two) times daily. 05/23/15   Kathrine Haddock, NP  PARoxetine (PAXIL) 20 MG tablet Take 1 tablet (20 mg total) by mouth daily. 02/27/16   Kathrine Haddock, NP  PARoxetine (PAXIL) 20 MG tablet TAKE 1 TABLET BY MOUTH EVERY DAY 03/20/16   Kathrine Haddock, NP  zolpidem (AMBIEN) 10 MG tablet Take 10 mg by mouth at bedtime as needed for sleep.    Historical Provider, MD   BP 141/83 mmHg  Pulse 67  Temp(Src) 98.1 F (36.7 C) (Oral)  Resp 21  Ht 6' (1.829 m)  Wt 220 lb (99.791 kg)  BMI 29.83 kg/m2  SpO2 100% Physical Exam  Constitutional: She is oriented to person, place, and time.  Tired, chills   HENT:  Head: Normocephalic.  Mouth/Throat: Oropharynx is clear and moist.  Eyes: Conjunctivae are normal. Pupils are equal, round, and reactive to light.  Neck: Normal range of motion. Neck supple.  Cardiovascular: Normal rate, regular rhythm and normal heart sounds.   Pulmonary/Chest: Effort normal and breath sounds normal. No respiratory distress. She has no wheezes. She has no rales.  Abdominal: Soft. Bowel sounds are normal. She exhibits no distension. There is no tenderness. There is no rebound and no guarding.  Musculoskeletal: Normal range of motion. She exhibits no edema or tenderness.  Neurological: She is alert and oriented to person, place, and time. No cranial nerve  deficit. Coordination normal.  Skin: Skin is warm.  Psychiatric: She has a normal mood and affect. Her behavior is normal. Judgment and thought content normal.  Nursing note and vitals reviewed.   ED Course  Procedures (including critical care time) Labs Review Labs Reviewed  CBC WITH DIFFERENTIAL/PLATELET - Abnormal; Notable for the following:    MCV 77.1 (*)    MCH 25.4 (*)    RDW 15.3 (*)    All other components within normal limits  COMPREHENSIVE METABOLIC PANEL - Abnormal; Notable for the following:    CO2 20 (*)    Glucose, Bld 111 (*)    Total Protein 8.5 (*)    ALT 13 (*)    GFR calc non Af Amer 58 (*)    All other components within normal limits  URINALYSIS COMPLETEWITH MICROSCOPIC ([facility] ONLY) - Abnormal; Notable for the following:    Color, Urine YELLOW (*)    APPearance CLEAR (*)    pH 9.0 (*)    Squamous Epithelial / LPF 0-5 (*)    All other components within normal limits  TROPONIN I    Imaging Review Dg Chest 2 View  04/05/2016  CLINICAL DATA:  Confusion EXAM: CHEST  2 VIEW COMPARISON:  02/25/2013 FINDINGS: Heart size upper normal and stable. Vascular pattern normal. Lungs clear. No effusions. IMPRESSION: No active cardiopulmonary disease. Electronically Signed   By: Skipper Cliche M.D.   On: 04/05/2016 16:16   Ct Head Wo Contrast  04/05/2016  CLINICAL DATA:  Worsening confusion since yesterday. Initial encounter. EXAM: CT HEAD WITHOUT CONTRAST TECHNIQUE: Contiguous axial images were obtained from the base of the skull through the vertex without intravenous contrast. COMPARISON:  None. FINDINGS: The brain appears normal without hemorrhage, infarct, mass lesion, mass effect, midline shift or abnormal extra-axial fluid collection. There is no hydrocephalus or pneumocephalus. Imaged paranasal sinuses and mastoid air cells are clear. The calvarium is intact. IMPRESSION: Negative head CT. Electronically Signed   By: Inge Rise M.D.   On: 04/05/2016 15:33   I  have personally reviewed and evaluated these images and lab results as part of my medical decision-making.   EKG Interpretation None       ED ECG REPORT I, YAO, DAVID, the attending physician, personally viewed and interpreted this ECG.   Date: 04/05/2016  EKG Time: 15:12  Rate: 71  Rhythm: normal EKG, normal sinus rhythm  Axis: normal  Intervals:right bundle branch block  ST&T Change: none   MDM   Final diagnoses:  None   Maria Hamilton is a 65 y.o. female here with confusion, chills. I think likely viral vs UTI vs pneumonia. Will get labs, UA, CXR. Will  hydrate and reassess.   4:42 PM Patient felt better with IVF. Bicarb 20 likely from dehydration. UA unremarkable. CXR and CT head unremarkable. I think likely viral syndrome. Recommend tylenol, motrin as needed, stay hydrated at home.     Wandra Arthurs, MD 04/05/16 [phone]

## 2016-04-15 ENCOUNTER — Other Ambulatory Visit: Payer: Self-pay | Admitting: Unknown Physician Specialty

## 2016-04-15 ENCOUNTER — Encounter: Payer: Self-pay | Admitting: Nurse Practitioner

## 2016-04-15 ENCOUNTER — Other Ambulatory Visit: Payer: Self-pay | Admitting: Nurse Practitioner

## 2016-04-16 ENCOUNTER — Ambulatory Visit (INDEPENDENT_AMBULATORY_CARE_PROVIDER_SITE_OTHER): Payer: Medicare HMO | Admitting: Nurse Practitioner

## 2016-04-16 ENCOUNTER — Encounter: Payer: Self-pay | Admitting: Nurse Practitioner

## 2016-04-16 VITALS — BP 108/74 | HR 71 | Temp 98.7°F | Ht 73.0 in | Wt 231.0 lb

## 2016-04-16 DIAGNOSIS — G894 Chronic pain syndrome: Secondary | ICD-10-CM

## 2016-04-16 DIAGNOSIS — F32A Depression, unspecified: Secondary | ICD-10-CM

## 2016-04-16 DIAGNOSIS — G4700 Insomnia, unspecified: Secondary | ICD-10-CM | POA: Diagnosis not present

## 2016-04-16 DIAGNOSIS — K219 Gastro-esophageal reflux disease without esophagitis: Secondary | ICD-10-CM | POA: Diagnosis not present

## 2016-04-16 DIAGNOSIS — F329 Major depressive disorder, single episode, unspecified: Secondary | ICD-10-CM | POA: Diagnosis not present

## 2016-04-16 DIAGNOSIS — I10 Essential (primary) hypertension: Secondary | ICD-10-CM | POA: Diagnosis not present

## 2016-04-16 DIAGNOSIS — F419 Anxiety disorder, unspecified: Secondary | ICD-10-CM

## 2016-04-16 MED ORDER — HYDROCHLOROTHIAZIDE 25 MG PO TABS: 25.0000 mg | ORAL_TABLET | Freq: Every day | ORAL | Status: DC

## 2016-04-16 MED ORDER — ESCITALOPRAM OXALATE 20 MG PO TABS: 20.0000 mg | ORAL_TABLET | Freq: Every day | ORAL | Status: DC

## 2016-04-16 MED ORDER — LYRICA 225 MG PO CAPS: 225.0000 mg | ORAL_CAPSULE | Freq: Two times a day (BID) | ORAL | Status: DC

## 2016-04-16 MED ORDER — AMLODIPINE BESYLATE 5 MG PO TABS: 5.0000 mg | ORAL_TABLET | Freq: Every day | ORAL | Status: DC

## 2016-04-16 MED ORDER — CLONAZEPAM 0.5 MG PO TABS: 0.5000 mg | ORAL_TABLET | Freq: Three times a day (TID) | ORAL | Status: DC | PRN

## 2016-04-16 MED ORDER — LOSARTAN POTASSIUM 50 MG PO TABS: 50.0000 mg | ORAL_TABLET | Freq: Every day | ORAL | Status: DC

## 2016-04-16 NOTE — Assessment & Plan Note (Signed)
Pt had to decide between Ambien and Klonopin- choosing klonopin 30 day supply faxed to pharmacy

## 2016-04-16 NOTE — Assessment & Plan Note (Signed)
Pt seems very down today  Husband concerned Answered no on her PHQ- 2 (scored zero) Pt feels paxil not helpfu Switching to 20 mg of lexapro at night  FU with new provider in 4 wks

## 2016-04-16 NOTE — Progress Notes (Signed)
Patient ID: Maria Hamilton, female    DOB: 30-Oct-1951  Age: 65 y.o. MRN: [id number]  CC: Establish Care   HPI Maria Hamilton presents for establishing care and CC medication refills. She is accompanied by her husband today. Last seen at [facility].    1) New Pt Info:   Immunizations- tdap 2013, unknown pna vaccines  Mammogram- unknown   Pap- Hysterectomy   Colonoscopy- 2011   2) Chronic Problems-  Arthritis-   Depression- Been on lexapro x 2-3 years  HTN- stable currently on medications- needs new refills   3) Acute Problems-  Needs refill on medications for 30 days then send optum the 90 days.   History Maria has a past medical history of Edema; Headache; GERD (gastroesophageal reflux disease); Anxiety; Hypertension; Fatigue; Vitamin D deficiency; Insomnia; Fibromyalgia; Low back pain; Chronic pain syndrome; Depression; and Chronic hip pain.   She has past surgical history that includes Breast surgery; Back surgery; Ectopic pregnancy surgery; Hip surgery; Abdominal hysterectomy; and cyst removal from wrist.   Her family history includes Alcohol abuse in her father; Cancer in her father; Heart disease in her mother.She reports that she has never smoked. She does not have any smokeless tobacco history on file. She reports that she does not drink alcohol or use illicit drugs.  Outpatient Prescriptions Prior to Visit  Medication Sig Dispense Refill  . amLODipine-benazepril (LOTREL) 5-20 MG per capsule Take 1 capsule by mouth daily. 90 capsule 3  . hydrochlorothiazide (HYDRODIURIL) 25 MG tablet Take 25 mg by mouth daily.  11  . losartan (COZAAR) 50 MG tablet Take 50 mg by mouth daily. for high blood pressure  2  . LYRICA 225 MG capsule Take 1 capsule (225 mg total) by mouth 2 (two) times daily. 60 capsule 6  . PARoxetine (PAXIL) 20 MG tablet TAKE 1 TABLET BY MOUTH EVERY DAY 90 tablet 1  . zolpidem (AMBIEN) 10 MG tablet Take 10 mg by mouth at bedtime as needed for sleep.    Marland Kitchen  aspirin EC 325 MG tablet Reported on 04/16/2016  0  . ciprofloxacin (CIPRO) 500 MG tablet Take 1 tablet by mouth 2 (two) times daily.  0  . clonazePAM (KLONOPIN) 0.5 MG tablet Take 1 tablet (0.5 mg total) by mouth 3 (three) times daily as needed for anxiety. Do not take within six hours of zolpidem (Patient not taking: Reported on 04/16/2016) 90 tablet 1  . HYDROcodone-acetaminophen (NORCO/VICODIN) 5-325 MG tablet Take 1 tablet by mouth every 6 (six) hours as needed.  0  . hydrOXYzine (ATARAX/VISTARIL) 25 MG tablet Take 25 mg by mouth every 6 (six) hours as needed.    Marland Kitchen PARoxetine (PAXIL) 20 MG tablet TAKE 1 TABLET (20 MG TOTAL) BY MOUTH DAILY. 30 tablet 0   No facility-administered medications prior to visit.    ROS Review of Systems  Constitutional: Negative for fever, chills, diaphoresis and fatigue.  Respiratory: Negative for chest tightness, shortness of breath and wheezing.   Cardiovascular: Negative for chest pain, palpitations and leg swelling.  Gastrointestinal: Negative for nausea, vomiting and diarrhea.  Skin: Negative for rash.  Neurological: Negative for dizziness, weakness, numbness and headaches.  Psychiatric/Behavioral: The patient is not nervous/anxious.    Objective:  BP 108/74 mmHg  Pulse 71  Temp(Src) 98.7 F (37.1 C) (Oral)  Ht 6\' 1"  (1.854 m)  Wt 231 lb (104.781 kg)  BMI 30.48 kg/m2  SpO2 96%  Physical Exam  Constitutional: She is oriented to person, place, and time. She appears  well-developed and well-nourished. No distress.  HENT:  Head: Normocephalic and atraumatic.  Right Ear: External ear normal.  Left Ear: External ear normal.  Cardiovascular: Normal rate, regular rhythm and normal heart sounds.   Pulmonary/Chest: Effort normal and breath sounds normal. No respiratory distress. She has no wheezes. She has no rales. She exhibits no tenderness.  Neurological: She is alert and oriented to person, place, and time. No cranial nerve deficit. She exhibits normal  muscle tone. Coordination normal.  Skin: Skin is warm and dry. No rash noted. She is not diaphoretic.  Psychiatric: She has a normal mood and affect. Her behavior is normal. Judgment and thought content normal.   Assessment & Plan:   There are no diagnoses linked to this encounter. I have discontinued Ms. Billard amLODipine-benazepril, hydrOXYzine, zolpidem, PARoxetine, PARoxetine, ciprofloxacin, and HYDROcodone-acetaminophen. I have also changed her hydrochlorothiazide and losartan. Additionally, I am having her start on escitalopram and amLODipine. Lastly, I am having her maintain her aspirin EC, clonazePAM, and LYRICA.  Meds ordered this encounter  Medications  . clonazePAM (KLONOPIN) 0.5 MG tablet    Sig: Take 1 tablet (0.5 mg total) by mouth 3 (three) times daily as needed for anxiety. Do not take within six hours of zolpidem    Dispense:  90 tablet    Refill:  1    Order Specific Question:  Supervising Provider    Answer:  Duncan Dull L [[id number]]  . hydrochlorothiazide (HYDRODIURIL) 25 MG tablet    Sig: Take 1 tablet (25 mg total) by mouth daily.    Dispense:  30 tablet    Refill:  0    Order Specific Question:  Supervising Provider    Answer:  Duncan Dull L [[id number]]  . LYRICA 225 MG capsule    Sig: Take 1 capsule (225 mg total) by mouth 2 (two) times daily.    Dispense:  60 capsule    Refill:  0    Order Specific Question:  Supervising Provider    Answer:  Duncan Dull L [[id number]]  . escitalopram (LEXAPRO) 20 MG tablet    Sig: Take 1 tablet (20 mg total) by mouth daily.    Dispense:  30 tablet    Refill:  0    Order Specific Question:  Supervising Provider    Answer:  Duncan Dull L [[id number]]  . losartan (COZAAR) 50 MG tablet    Sig: Take 1 tablet (50 mg total) by mouth daily. for high blood pressure    Dispense:  30 tablet    Refill:  0    Order Specific Question:  Supervising Provider    Answer:  Duncan Dull L [[id number]]  . amLODipine (NORVASC) 5 MG tablet    Sig: Take  1 tablet (5 mg total) by mouth daily.    Dispense:  30 tablet    Refill:  0    Order Specific Question:  Supervising Provider    Answer:  Sherlene Shams [[id number]]     Follow-up: Return in about 4 weeks (around 05/14/2016) for Follow up .

## 2016-04-16 NOTE — Assessment & Plan Note (Signed)
Klonopin most helpful  Reports past taking it 1 tab am 1/2 tab midday and 1 tab pm  30 day supply faxed to pharmacy

## 2016-04-16 NOTE — Patient Instructions (Addendum)
The following medications have been sent to [facility] for 1 month supply: This may take a few hours to complete your medications for pick up at your pharmacy.  Lyrica- faxed Klonopin- Faxed HCTZ- by computer Losartan 50 mg- by computer  Amlodipine- 5 mg sent to pharmacy via computer- stopped the benazepril part  Switched from Paxil (stop this one) and switch to lexapro 20 mg once at night   Follow up in 4 weeks with your new provider.   Nice to meet you!

## 2016-04-16 NOTE — Assessment & Plan Note (Signed)
Stable currently  Trigger foods known

## 2016-04-16 NOTE — Progress Notes (Signed)
Pre visit review using our clinic review tool, if applicable. No additional management support is needed unless otherwise documented below in the visit note. 

## 2016-04-16 NOTE — Assessment & Plan Note (Signed)
BP Readings from Last 3 Encounters:  04/16/16 108/74  04/05/16 141/83  09/27/15 136/78   Stable currently Obtain records  ED visit for Possible angioedema r/t ACEI use Losartan started by last place of care- continue this and take off the benazepril part of the combo med.

## 2016-04-16 NOTE — Assessment & Plan Note (Signed)
Not on medications currently

## 2016-04-24 ENCOUNTER — Other Ambulatory Visit: Payer: Self-pay | Admitting: Nurse Practitioner

## 2016-04-24 MED ORDER — CLONAZEPAM 0.5 MG PO TABS: 0.5000 mg | ORAL_TABLET | Freq: Three times a day (TID) | ORAL | Status: DC | PRN

## 2016-04-24 NOTE — Addendum Note (Signed)
Addended by: Bevelyn Ngo on: 04/24/2016 01:31 PM   Modules accepted: Orders

## 2016-04-24 NOTE — Telephone Encounter (Signed)
Spoke with the patient and I explained that she needs to pick one of the medications, not both, she would like the Klonopin.  Please advise for a refill. thanks

## 2016-04-24 NOTE — Telephone Encounter (Signed)
Please advise for refill as you established care on 5/1 with this patient and you documented in the notes she takes, but it is not on her current medication list for [place].  thanks

## 2016-04-24 NOTE — Telephone Encounter (Signed)
Pt is wanting to get a refill on zolpidem 10mg  this is not on her med. List.. Please advise pt at [phone].  Sent to [id number] - [place], [place]

## 2016-05-15 ENCOUNTER — Other Ambulatory Visit: Payer: Self-pay | Admitting: Family Medicine

## 2016-05-15 ENCOUNTER — Other Ambulatory Visit: Payer: Self-pay | Admitting: Nurse Practitioner

## 2016-05-15 ENCOUNTER — Other Ambulatory Visit: Payer: Self-pay | Admitting: Unknown Physician Specialty

## 2016-05-15 MED ORDER — AMLODIPINE BESYLATE 5 MG PO TABS: 5.0000 mg | ORAL_TABLET | Freq: Every day | ORAL | Status: DC

## 2016-05-15 MED ORDER — ESCITALOPRAM OXALATE 20 MG PO TABS: 20.0000 mg | ORAL_TABLET | Freq: Every day | ORAL | Status: DC

## 2016-05-15 NOTE — Telephone Encounter (Signed)
Needs to be seen for farther refills

## 2016-05-15 NOTE — Telephone Encounter (Signed)
error 

## 2016-05-28 NOTE — Telephone Encounter (Signed)
Pt is being seen by Dr. Josephina Gip @ [facility]. Thanks.

## 2016-05-29 ENCOUNTER — Ambulatory Visit (INDEPENDENT_AMBULATORY_CARE_PROVIDER_SITE_OTHER): Payer: Medicare HMO | Admitting: Family Medicine

## 2016-05-29 ENCOUNTER — Encounter: Payer: Self-pay | Admitting: Family Medicine

## 2016-05-29 VITALS — BP 110/76 | HR 73 | Temp 98.4°F | Ht 73.0 in | Wt 231.2 lb

## 2016-05-29 DIAGNOSIS — M545 Low back pain: Secondary | ICD-10-CM

## 2016-05-29 DIAGNOSIS — I10 Essential (primary) hypertension: Secondary | ICD-10-CM

## 2016-05-29 DIAGNOSIS — F32A Depression, unspecified: Secondary | ICD-10-CM

## 2016-05-29 DIAGNOSIS — M797 Fibromyalgia: Secondary | ICD-10-CM

## 2016-05-29 DIAGNOSIS — F329 Major depressive disorder, single episode, unspecified: Secondary | ICD-10-CM

## 2016-05-29 MED ORDER — LYRICA 225 MG PO CAPS: 225.0000 mg | ORAL_CAPSULE | Freq: Two times a day (BID) | ORAL | Status: DC

## 2016-05-29 MED ORDER — AMLODIPINE BESYLATE 5 MG PO TABS: 5.0000 mg | ORAL_TABLET | Freq: Every day | ORAL | Status: DC

## 2016-05-29 MED ORDER — HYDROCHLOROTHIAZIDE 25 MG PO TABS: 25.0000 mg | ORAL_TABLET | Freq: Every day | ORAL | Status: DC

## 2016-05-29 MED ORDER — AMLODIPINE BESYLATE 5 MG PO TABS
5.0000 mg | ORAL_TABLET | Freq: Every day | ORAL | Status: DC
Start: 2016-05-29 — End: 2017-03-25

## 2016-05-29 MED ORDER — ESCITALOPRAM OXALATE 20 MG PO TABS: 20.0000 mg | ORAL_TABLET | Freq: Every day | ORAL | Status: DC

## 2016-05-29 NOTE — Assessment & Plan Note (Addendum)
Stable. Refill Lyrica.

## 2016-05-29 NOTE — Progress Notes (Signed)
Pre visit review using our clinic review tool, if applicable. No additional management support is needed unless otherwise documented below in the visit note. 

## 2016-05-29 NOTE — Assessment & Plan Note (Signed)
Chronic issue. Recently underwent lumbar fusion. She is neurologically at her baseline. No red flags. She'll continue to follow with her neurosurgeon. Given return precautions.

## 2016-05-29 NOTE — Assessment & Plan Note (Signed)
Well-controlled. Continue Lexapro. Klonopin as needed for anxiety. Given return precautions.

## 2016-05-29 NOTE — Assessment & Plan Note (Signed)
Blood pressure is well-controlled today. It was noted the patient had angioedema reaction to an ACE inhibitor in the past. She is noted to be on losartan at this time. Discussed the potential for cross reactivity with this and potential for angioedema with this medication. We will discontinue losartan. She will continue amlodipine and hydrochlorothiazide. She'll monitor blood pressure daily. If rises to greater than 140/90 she will let us know and we can increase her amlodipine to 10 mg daily. She'll follow-up in one month.

## 2016-05-29 NOTE — Patient Instructions (Signed)
Nice to meet you. I'm going to have you stop her losartan given her allergy to ace inhibitors with angioedema. We will refill your other medications today. Please check your blood pressure daily and keep a log of this. Your goal is less than 140/90. If you start to creep up above this please let us know. Please continue to monitor your low back. If you develop numbness, weakness, loss of bowel or bladder function, numbness of her legs, fevers, or any new or changing symptoms please seek medical attention.

## 2016-05-29 NOTE — Progress Notes (Signed)
Patient ID: Maria Hamilton, female   DOB: 01/29/1951, 65 y.o.   MRN: [id number]  Maria Rumps, MD Phone: [phone]  Maria Hamilton is a 65 y.o. female who presents today for follow-up.  HYPERTENSION Disease Monitoring Home BP Monitoring not checking Chest pain- no    Dyspnea- no Medications Compliance-  taking amlodipine, hydrochlorothiazide, and losartan.   Edema- no Patient was noted to have a ACE inhibitor allergy of angioedema.  Depression: Patient notes this is stable. Well controlled with Lexapro and Klonopin as needed. Takes Klonopin nightly. Rarely takes during the day. No SI or HI.  Fibromyalgia: Takes Lyrica for this. Intermittently aches all over her body. Occurs at random times and random places. Feels Lyrica has beneficial.  Low back pain: Patient notes chronic low back pain. Had a lumbar fusion 3 months ago per her report. Notes this was done as she has chronic numbness in her left foot and posterior left leg upper calf. This is not started to improve yet. No other numbness. No weakness. No loss of bowel or bladder function, saddle anesthesia, or fevers.   PMH: nonsmoker.   ROS see history of present illness  Objective  Physical Exam Filed Vitals:   05/29/16 1058  BP: 110/76  Pulse: 73  Temp: 98.4 F (36.9 C)    BP Readings from Last 3 Encounters:  05/29/16 110/76  04/16/16 108/74  04/05/16 141/83   Wt Readings from Last 3 Encounters:  05/29/16 231 lb 3.2 oz (104.872 kg)  04/16/16 231 lb (104.781 kg)  04/05/16 220 lb (99.791 kg)    Physical Exam  Constitutional: She is well-developed, well-nourished, and in no distress.  HENT:  Head: Normocephalic and atraumatic.  Right Ear: External ear normal.  Left Ear: External ear normal.  Cardiovascular: Normal rate, regular rhythm and normal heart sounds.   Pulmonary/Chest: Effort normal and breath sounds normal.  Musculoskeletal: She exhibits no edema.  Midline lumbar scar well healing, mild  paraspinous lumbar tenderness, no midline spine tenderness or step-off  Neurological: She is alert. Gait normal.  5 out of 5 strength bilateral quads, hamstrings, plantar flexion, and dorsiflexion, sensation to light touch slightly diminished left posterior calf, otherwise light touch sensation intact bilateral lower extremities, 2+ patellar reflexes  Skin: Skin is warm and dry. She is not diaphoretic.     Assessment/Plan: Please see individual problem list.  Hypertension Blood pressure is well-controlled today. It was noted the patient had angioedema reaction to an ACE inhibitor in the past. She is noted to be on losartan at this time. Discussed the potential for cross reactivity with this and potential for angioedema with this medication. We will discontinue losartan. She will continue amlodipine and hydrochlorothiazide. She'll monitor blood pressure daily. If rises to greater than 140/90 she will let us know and we can increase her amlodipine to 10 mg daily. She'll follow-up in one month.  Fibromyalgia Stable. Refill Lyrica.  Depression Well-controlled. Continue Lexapro. Klonopin as needed for anxiety. Given return precautions.  Low back pain Chronic issue. Recently underwent lumbar fusion. She is neurologically at her baseline. No red flags. She'll continue to follow with her neurosurgeon. Given return precautions.   Maria Rumps, MD [place]

## 2016-07-02 ENCOUNTER — Other Ambulatory Visit: Payer: Self-pay | Admitting: Unknown Physician Specialty

## 2016-07-03 ENCOUNTER — Ambulatory Visit: Payer: Medicare HMO | Admitting: Family Medicine

## 2016-07-03 DIAGNOSIS — Z0289 Encounter for other administrative examinations: Secondary | ICD-10-CM

## 2016-07-13 ENCOUNTER — Encounter: Payer: Self-pay | Admitting: Emergency Medicine

## 2016-07-13 ENCOUNTER — Emergency Department
Admission: EM | Admit: 2016-07-13 | Discharge: 2016-07-13 | Disposition: A | Discharge status: Home / Self Care | Payer: Medicare HMO | Attending: Emergency Medicine | Admitting: Emergency Medicine

## 2016-07-13 ENCOUNTER — Emergency Department: Payer: Medicare HMO

## 2016-07-13 DIAGNOSIS — F419 Anxiety disorder, unspecified: Secondary | ICD-10-CM | POA: Diagnosis not present

## 2016-07-13 DIAGNOSIS — R42 Dizziness and giddiness: Secondary | ICD-10-CM

## 2016-07-13 DIAGNOSIS — Z7982 Long term (current) use of aspirin: Secondary | ICD-10-CM | POA: Diagnosis not present

## 2016-07-13 DIAGNOSIS — I10 Essential (primary) hypertension: Secondary | ICD-10-CM | POA: Insufficient documentation

## 2016-07-13 DIAGNOSIS — Z79899 Other long term (current) drug therapy: Secondary | ICD-10-CM | POA: Insufficient documentation

## 2016-07-13 LAB — URINE DRUG SCREEN, QUALITATIVE (ARMC ONLY)
AMPHETAMINES, UR SCREEN: NOT DETECTED
BENZODIAZEPINE, UR SCRN: NOT DETECTED
Barbiturates, Ur Screen: NOT DETECTED
Cannabinoid 50 Ng, Ur ~~LOC~~: NOT DETECTED
Cocaine Metabolite,Ur ~~LOC~~: NOT DETECTED
MDMA (Ecstasy)Ur Screen: NOT DETECTED
Methadone Scn, Ur: NOT DETECTED
OPIATE, UR SCREEN: POSITIVE — AB
PHENCYCLIDINE (PCP) UR S: NOT DETECTED
Tricyclic, Ur Screen: NOT DETECTED

## 2016-07-13 LAB — CBC WITH DIFFERENTIAL/PLATELET
Basophils Absolute: 0 10*3/uL (ref 0–0.1)
Basophils Relative: 0 %
EOS ABS: 0.1 10*3/uL (ref 0–0.7)
Eosinophils Relative: 2 %
HEMATOCRIT: 39.2 % (ref 35.0–47.0)
HEMOGLOBIN: 12.9 g/dL (ref 12.0–16.0)
Lymphocytes Relative: 36 %
Lymphs Abs: 1.9 10*3/uL (ref 1.0–3.6)
MCH: 25.8 pg — ABNORMAL LOW (ref 26.0–34.0)
MCHC: 33 g/dL (ref 32.0–36.0)
MCV: 78.1 fL — ABNORMAL LOW (ref 80.0–100.0)
MONOS PCT: 10 %
Monocytes Absolute: 0.5 10*3/uL (ref 0.2–0.9)
NEUTROS ABS: 2.6 10*3/uL (ref 1.4–6.5)
NEUTROS PCT: 52 %
PLATELETS: 287 10*3/uL (ref 150–440)
RBC: 5.02 MIL/uL (ref 3.80–5.20)
RDW: 18.5 % — AB (ref 11.5–14.5)
WBC: 5.2 10*3/uL (ref 3.6–11.0)

## 2016-07-13 LAB — COMPREHENSIVE METABOLIC PANEL
ALT: 14 U/L (ref 14–54)
ANION GAP: 11 (ref 5–15)
AST: 24 U/L (ref 15–41)
Albumin: 4.1 g/dL (ref 3.5–5.0)
Alkaline Phosphatase: 108 U/L (ref 38–126)
BILIRUBIN TOTAL: 0.6 mg/dL (ref 0.3–1.2)
BUN: 7 mg/dL (ref 6–20)
CO2: 28 mmol/L (ref 22–32)
Calcium: 9.2 mg/dL (ref 8.9–10.3)
Chloride: 96 mmol/L — ABNORMAL LOW (ref 101–111)
Creatinine, Ser: 0.92 mg/dL (ref 0.44–1.00)
Glucose, Bld: 123 mg/dL — ABNORMAL HIGH (ref 65–99)
POTASSIUM: 3 mmol/L — AB (ref 3.5–5.1)
Sodium: 135 mmol/L (ref 135–145)
TOTAL PROTEIN: 9 g/dL — AB (ref 6.5–8.1)

## 2016-07-13 LAB — URINALYSIS COMPLETE WITH MICROSCOPIC (ARMC ONLY)
Bacteria, UA: NONE SEEN
Bilirubin Urine: NEGATIVE
Glucose, UA: NEGATIVE mg/dL
HGB URINE DIPSTICK: NEGATIVE
KETONES UR: NEGATIVE mg/dL
LEUKOCYTES UA: NEGATIVE
NITRITE: NEGATIVE
PH: 8 (ref 5.0–8.0)
PROTEIN: NEGATIVE mg/dL
SPECIFIC GRAVITY, URINE: 1.012 (ref 1.005–1.030)

## 2016-07-13 LAB — TROPONIN I: Troponin I: <0.03 ng/mL (ref <0.03)

## 2016-07-13 LAB — ETHANOL

## 2016-07-13 LAB — GLUCOSE, CAPILLARY: Glucose-Capillary: 109 mg/dL — ABNORMAL HIGH (ref 65–99)

## 2016-07-13 MED ORDER — SODIUM CHLORIDE 0.9 % IV BOLUS (SEPSIS)
1000.0000 mL | Freq: Once | INTRAVENOUS | Status: AC
  Administered 2016-07-13: 1000 mL via INTRAVENOUS

## 2016-07-13 MED ORDER — MECLIZINE HCL 25 MG PO TABS
12.5000 mg | ORAL_TABLET | Freq: Once | ORAL | Status: AC
  Administered 2016-07-13: 12.5 mg via ORAL
  Filled 2016-07-13: qty 1

## 2016-07-13 MED ORDER — DIAZEPAM 5 MG/ML IJ SOLN
5.0000 mg | Freq: Once | INTRAMUSCULAR | Status: AC
  Administered 2016-07-13: 5 mg via INTRAVENOUS
  Filled 2016-07-13: qty 2

## 2016-07-13 MED ORDER — POTASSIUM CHLORIDE CRYS ER 20 MEQ PO TBCR
40.0000 meq | EXTENDED_RELEASE_TABLET | Freq: Once | ORAL | Status: AC
  Administered 2016-07-13: 40 meq via ORAL
  Filled 2016-07-13: qty 2

## 2016-07-13 MED ORDER — LORAZEPAM 2 MG/ML IJ SOLN
1.0000 mg | Freq: Once | INTRAMUSCULAR | Status: AC
  Administered 2016-07-13: 1 mg via INTRAVENOUS
  Filled 2016-07-13: qty 1

## 2016-07-13 MED ORDER — PROMETHAZINE HCL 25 MG/ML IJ SOLN
12.5000 mg | Freq: Once | INTRAMUSCULAR | Status: AC
  Administered 2016-07-13: 12.5 mg via INTRAVENOUS
  Filled 2016-07-13: qty 1

## 2016-07-13 MED ORDER — MECLIZINE HCL 12.5 MG PO TABS: 12.5000 mg | ORAL_TABLET | Freq: Three times a day (TID) | ORAL | 0 refills | Status: DC | PRN

## 2016-07-13 NOTE — ED Notes (Signed)
PT in CT.

## 2016-07-13 NOTE — ED Provider Notes (Addendum)
[facility] Provider Note   CSN: [id number] Arrival date & time: 07/13/16  1506  First Provider Contact:  None       History   Chief Complaint Chief Complaint  Patient presents with  . Dehydration    HPI Maria Hamilton is a 65 y.o. female hx of Fibromyalgia, Chronic pain syndrome, depression here presenting with anxiety, headaches. Patient states that her grandson was not acting right yesterday and just "fell out". EMS was called and he was brought to the hospital and eventually sent home. She still thinks that he is not acting right and was very concerned and anxious about him. She states that she just feels not herself and states that she feels very thirsty. She also feels that she is numb and tingling all over. Also had a headache as well. Denies any chest pain or shortness of breath. Denies any overdose and denies any thoughts of harming herself or others.  The history is provided by the patient.    Past Medical History:  Diagnosis Date  . Anxiety   . Chronic hip pain   . Chronic pain syndrome   . Depression   . Edema   . Fatigue   . Fibromyalgia   . GERD (gastroesophageal reflux disease)   . Headache   . Hypertension   . Insomnia   . Low back pain   . Vitamin D deficiency     Patient Active Problem List   Diagnosis Date Noted  . GERD (gastroesophageal reflux disease) 08/03/2015  . Acute anxiety 08/03/2015  . Rheumatoid factor positive 08/03/2015  . Fatigue 08/03/2015  . Vitamin D deficiency 08/03/2015  . Insomnia 08/03/2015  . Fibromyalgia 08/03/2015  . Low back pain 08/03/2015  . Chronic pain syndrome 08/03/2015  . Depression 08/03/2015  . Hypertension 08/03/2015  . Cellulitis due to MRSA 08/03/2015  . Mechanical complication of internal joint prosthesis ([place]) 04/22/2012  . Arthralgia of hip 03/17/2012    Past Surgical History:  Procedure Laterality Date  . ABDOMINAL HYSTERECTOMY    . BACK SURGERY    . BREAST SURGERY    . cyst  removal from wrist    . ECTOPIC PREGNANCY SURGERY    . HIP SURGERY     x4    OB History    No data available       Home Medications    Prior to Admission medications   Medication Sig Start Date End Date Taking? Authorizing Provider  amLODipine (NORVASC) 5 MG tablet Take 1 tablet (5 mg total) by mouth daily. 05/29/16   Leone Haven, MD  aspirin EC 325 MG tablet Reported on 04/16/2016 07/07/15   Historical Provider, MD  clonazePAM (KLONOPIN) 0.5 MG tablet Take 1 tablet (0.5 mg total) by mouth 3 (three) times daily as needed for anxiety. Do not take within six hours of zolpidem 04/24/16   Rubbie Battiest, NP  escitalopram (LEXAPRO) 20 MG tablet Take 1 tablet (20 mg total) by mouth daily. 05/29/16   Leone Haven, MD  hydrochlorothiazide (HYDRODIURIL) 25 MG tablet Take 1 tablet (25 mg total) by mouth daily. 05/29/16   Leone Haven, MD  hydrOXYzine (ATARAX/VISTARIL) 25 MG tablet TAKE 1 TABLET BY MOUTH FOUR TIMES DAILY 07/03/16   Kathrine Haddock, NP  LYRICA 225 MG capsule Take 1 capsule (225 mg total) by mouth 2 (two) times daily. 05/29/16   Leone Haven, MD    Family History Family History  Problem Relation Age of Onset  . Heart  disease Mother   . Cancer Father   . Alcohol abuse Father     Social History Social History  Substance Use Topics  . Smoking status: Never Smoker  . Smokeless tobacco: Not on file  . Alcohol use No     Allergies   Ace inhibitors; Penicillin g benzathine; and Tape   Review of Systems Review of Systems  Psychiatric/Behavioral: The patient is nervous/anxious.   All other systems reviewed and are negative.    Physical Exam Updated Vital Signs BP (!) 145/82   Pulse 69   Temp 97.9 F (36.6 C) (Oral)   Resp 15   Ht 6\' 1"  (1.854 m)   Wt 230 lb (104.3 kg)   SpO2 99%   BMI 30.34 kg/m   Physical Exam  Constitutional: She is oriented to person, place, and time.  Anxious   HENT:  Head: Normocephalic.  Eyes: EOM are normal. Pupils are  equal, round, and reactive to light.  Neck: Normal range of motion. Neck supple.  Cardiovascular: Normal rate and regular rhythm.   Pulmonary/Chest: Effort normal and breath sounds normal.  Abdominal: Soft. Bowel sounds are normal.  Musculoskeletal: Normal range of motion.  Neurological: She is alert and oriented to person, place, and time.  CN 2-12 intact. She refuses to lift up her legs or arms but has spontaneous arm and leg movements   Skin: Skin is warm.  Psychiatric:  Anxious   Nursing note and vitals reviewed.    ED Treatments / Results  Labs (all labs ordered are listed, but only abnormal results are displayed) Labs Reviewed  CBC WITH DIFFERENTIAL/PLATELET - Abnormal; Notable for the following:       Result Value   MCV 78.1 (*)    MCH 25.8 (*)    RDW 18.5 (*)    All other components within normal limits  COMPREHENSIVE METABOLIC PANEL - Abnormal; Notable for the following:    Potassium 3.0 (*)    Chloride 96 (*)    Glucose, Bld 123 (*)    Total Protein 9.0 (*)    All other components within normal limits  URINALYSIS COMPLETEWITH MICROSCOPIC ([facility] ONLY) - Abnormal; Notable for the following:    Color, Urine YELLOW (*)    APPearance CLEAR (*)    Squamous Epithelial / LPF 0-5 (*)    All other components within normal limits  URINE DRUG SCREEN, QUALITATIVE ([facility] ONLY) - Abnormal; Notable for the following:    Opiate, Ur Screen POSITIVE (*)    All other components within normal limits  GLUCOSE, CAPILLARY - Abnormal; Notable for the following:    Glucose-Capillary 109 (*)    All other components within normal limits  TROPONIN I  ETHANOL  CBG MONITORING, ED    EKG  EKG Interpretation None      ED ECG REPORT I, YAO, DAVID, the attending physician, personally viewed and interpreted this ECG.   Date: 07/13/2016  EKG Time: 15:51 pm  Rate: 68  Rhythm: normal EKG, normal sinus rhythm  Axis: normal  Intervals:none  ST&T Change: slightly prolonged QT  ED ECG  REPORT I, YAO, DAVID, the attending physician, personally viewed and interpreted this ECG.   Date: 07/13/2016  EKG Time: 18:36 pm  Rate: 68  Rhythm: normal EKG, normal sinus rhythm  Axis: normal  Intervals:none  ST&T Change: QTc 530    Radiology Ct Head Wo Contrast  Result Date: 07/13/2016 CLINICAL DATA:  Altered mental status. EXAM: CT HEAD WITHOUT CONTRAST TECHNIQUE: Contiguous axial images  were obtained from the base of the skull through the vertex without intravenous contrast. COMPARISON:  CT head without contrast 04/05/2016. FINDINGS: No acute cortical infarct, hemorrhage, or mass lesion is present. The ventricles are of normal size. No significant extra-axial fluid collection is evident. The paranasal sinuses and mastoid air cells are clear. The calvarium is intact. No significant extra-axial fluid collection is present. The globes and orbits are intact. IMPRESSION: Negative CT of the head. Electronically Signed   By: San Morelle M.D.   On: 07/13/2016 16:57   Procedures Procedures (including critical care time)  Medications Ordered in ED Medications  sodium chloride 0.9 % bolus 1,000 mL (0 mLs Intravenous Stopped 07/13/16 1745)  LORazepam (ATIVAN) injection 1 mg (1 mg Intravenous Given 07/13/16 1620)  promethazine (PHENERGAN) injection 12.5 mg (12.5 mg Intravenous Given 07/13/16 1620)  potassium chloride SA (K-DUR,KLOR-CON) CR tablet 40 mEq (40 mEq Oral Given 07/13/16 1700)  sodium chloride 0.9 % bolus 1,000 mL (1,000 mLs Intravenous New Bag/Given 07/13/16 1751)  diazepam (VALIUM) injection 5 mg (5 mg Intravenous Given 07/13/16 1747)  meclizine (ANTIVERT) tablet 12.5 mg (12.5 mg Oral Given 07/13/16 1747)     Initial Impression / Assessment and Plan / ED Course  I have reviewed the triage vital signs and the nursing notes.  Pertinent labs & imaging results that were available during my care of the patient were reviewed by me and considered in my medical decision making (see  chart for details).  Clinical Course   Maria Hamilton is a 65 y.o. female here with headaches, anxiety, subjective weakness. Appears tearful and concerned about family member. Likely anxiety vs migraines. Will check labs, UA, UDS, CT head. Will give migraine cocktail, ativan.   6:25 PM CT head unremarkable. Labs at baseline. Tried to stand her up half an hour ago but still feels dizzy. I gave her meclizine, valium and now felt better. ? Rightward nystagmus, no rotatory nystagmus. Able to ambulate. I doubt dissection or posterior stroke. Will dc home with meclizine prn dizziness. Is currently on klonopin for anxiety and I recommend that she continues to take it. Of note initial QTc 590 but repeat 530. Didn't pass out. No arrhythmia on monitor. Won't need further workup for now.     Final Clinical Impressions(s) / ED Diagnoses   Final diagnoses:  None    New Prescriptions New Prescriptions   No medications on file     Drenda Freeze, MD 07/13/16 [place] Yao, MD 07/13/16 [phone]

## 2016-07-13 NOTE — ED Notes (Signed)
Patient requesting to speak with chaplin.  Chaplin paged but is with a critical patient.  Informed patient.

## 2016-07-13 NOTE — ED Notes (Signed)
Patient ambulated to bathroom after RN left room to get the patient a walker. Pt states she can hold onto furniture to walk to bathroom. Instructed patient to call for help next time she needs to get up.  Patient reports some dizziness upon ambulation but has much improved since prior to arrival.

## 2016-07-13 NOTE — ED Notes (Signed)
Informed Dr. Darl Householder, patient c/o dizziness whenever moved in the bed and have not been able to do orthostatic blood pressure.

## 2016-07-13 NOTE — Discharge Instructions (Signed)
Take meclizine as needed for dizziness.   Stay hydrated.   Take your klonopin for anxiety.   See your doctor  Return to ER if you have panic attack, worse dizziness, passing out, chest pain, weakness, numbness.

## 2016-07-13 NOTE — ED Triage Notes (Addendum)
Pt keeps repeating "i am thirsty, I am so thirsty". Pt reports dizziness and weakness. Tingling in hands. Pt keeps repeating same sentences over and over. Pt does nto seem to be acting right.

## 2016-07-13 NOTE — ED Notes (Signed)
Family member at bedside states another family told her the pt is not taking her medication correctly.

## 2016-07-16 ENCOUNTER — Ambulatory Visit (INDEPENDENT_AMBULATORY_CARE_PROVIDER_SITE_OTHER): Payer: Medicare HMO | Admitting: Family Medicine

## 2016-07-16 VITALS — BP 130/88 | HR 75 | Temp 98.2°F | Wt 229.6 lb

## 2016-07-16 DIAGNOSIS — G8929 Other chronic pain: Secondary | ICD-10-CM

## 2016-07-16 DIAGNOSIS — M549 Dorsalgia, unspecified: Secondary | ICD-10-CM | POA: Diagnosis not present

## 2016-07-16 DIAGNOSIS — G894 Chronic pain syndrome: Secondary | ICD-10-CM | POA: Diagnosis not present

## 2016-07-16 NOTE — Assessment & Plan Note (Signed)
Patient with chronic pain related to her low back. Discussed that she was at the end of what I can do for her discomfort. We will refer to pain management for further management. Advised that she should take Tylenol 1000 mg every 6 hours as needed. She is not to exceed 4000 mg of Tylenol daily. She is given return precautions.

## 2016-07-16 NOTE — Progress Notes (Signed)
  [name], MD Phone: [phone]  Maria Hamilton is a 65 y.o. female who presents today for same-day visit.  Back pain: Patient notes a long history of low back pain. She's had surgeries 5 times on her lumbar spine. Sciatica frequently down the left side. Notes her left foot and toes stay numb and this has been this way for several years. Last had surgery earlier this year. No other numbness. No weakness, saddle anesthesia, loss of bowel or bladder function, or fevers. Has been on Lyrica, oxycodone, muscle relaxers, gabapentin, and Tylenol. Tylenol is somewhat beneficial. The others have not been beneficial. States she was advised they would not operate on her again.   ROS see history of present illness  Objective  Physical Exam Vitals:   07/16/16 1311  BP: 130/88  Pulse: 75  Temp: 98.2 F (36.8 C)    BP Readings from Last 3 Encounters:  07/16/16 130/88  07/13/16 (!) 141/87  05/29/16 110/76   Wt Readings from Last 3 Encounters:  07/16/16 229 lb 9.6 oz (104.1 kg)  07/13/16 230 lb (104.3 kg)  05/29/16 231 lb 3.2 oz (104.9 kg)    Physical Exam  Constitutional: No distress.  Cardiovascular: Normal rate, regular rhythm and normal heart sounds.   Pulmonary/Chest: Effort normal and breath sounds normal.  Musculoskeletal:  No midline spine tenderness, no midline spine step-off, there is a midline lumbar spine scar, mild bilateral lumbar spine muscular tenderness with no skin changes  Neurological: She is alert.  5 out of 5 strength bilateral quads, hamstrings, plantar flexion, and dorsiflexion, sensation light touch intact bilaterally lower extremities, 2+ patellar reflexes  Skin: Skin is warm and dry. She is not diaphoretic.     Assessment/Plan: Please see individual problem list.  Chronic pain syndrome Patient with chronic pain related to her low back. Discussed that she was at the end of what I can do for her discomfort. We will refer to pain management for  further management. Advised that she should take Tylenol 1000 mg every 6 hours as needed. She is not to exceed 4000 mg of Tylenol daily. She is given return precautions.   Orders Placed This Encounter  Procedures  . Ambulatory referral to Pain Clinic    Referral Priority:   Routine    Referral Type:   Consultation    Referral Reason:   Specialty Services Required    Requested Specialty:   Pain Medicine    Number of Visits Requested:   1    [name], MD [place]

## 2016-07-16 NOTE — Patient Instructions (Signed)
Nice to see you. We'll get you referred to pain clinic. You can take Tylenol for your discomfort. Do not exceed 4000 mg of Tylenol in a day. If you develop numbness, weakness, loss of bowel or bladder function, numbness between her legs, or any new or changing symptoms please seek medical attention.

## 2016-07-16 NOTE — Progress Notes (Signed)
Pre visit review using our clinic review tool, if applicable. No additional management support is needed unless otherwise documented below in the visit note. 

## 2016-07-19 ENCOUNTER — Encounter: Payer: Self-pay | Admitting: Emergency Medicine

## 2016-07-19 ENCOUNTER — Emergency Department
Admission: EM | Admit: 2016-07-19 | Discharge: 2016-07-19 | Disposition: A | Discharge status: Home / Self Care | Payer: Medicare HMO | Attending: Student in an Organized Health Care Education/Training Program | Admitting: Student in an Organized Health Care Education/Training Program

## 2016-07-19 DIAGNOSIS — I10 Essential (primary) hypertension: Secondary | ICD-10-CM | POA: Insufficient documentation

## 2016-07-19 DIAGNOSIS — M549 Dorsalgia, unspecified: Secondary | ICD-10-CM | POA: Diagnosis present

## 2016-07-19 DIAGNOSIS — Z7982 Long term (current) use of aspirin: Secondary | ICD-10-CM | POA: Diagnosis not present

## 2016-07-19 DIAGNOSIS — M5432 Sciatica, left side: Secondary | ICD-10-CM | POA: Insufficient documentation

## 2016-07-19 MED ORDER — DEXAMETHASONE SODIUM PHOSPHATE 10 MG/ML IJ SOLN
10.0000 mg | Freq: Once | INTRAMUSCULAR | Status: AC
  Administered 2016-07-19: 10 mg via INTRAMUSCULAR
  Filled 2016-07-19: qty 1

## 2016-07-19 MED ORDER — HYDROMORPHONE HCL 1 MG/ML IJ SOLN
0.5000 mg | Freq: Once | INTRAMUSCULAR | Status: AC
Start: 2016-07-19 — End: 2016-07-19
  Administered 2016-07-19: 0.5 mg via INTRAMUSCULAR
  Filled 2016-07-19: qty 1

## 2016-07-19 MED ORDER — OXYCODONE-ACETAMINOPHEN 5-325 MG PO TABS: 1.0000 | ORAL_TABLET | Freq: Three times a day (TID) | ORAL | 0 refills | Status: DC | PRN

## 2016-07-19 MED ORDER — METHYLPREDNISOLONE 4 MG PO TBPK: ORAL_TABLET | ORAL | 0 refills | Status: DC

## 2016-07-19 NOTE — ED Provider Notes (Signed)
[facility] Emergency Department Provider Note   ____________________________________________   First MD Initiated Contact with Patient 07/19/16 1416     (approximate)  I have reviewed the triage vital signs and the nursing notes.   HISTORY  Chief Complaint Back Pain    HPI Maria Hamilton is a 65 y.o. female patient complaining of radicular pain to the left leg times one week. Patient denies any provocative incident for this complaint. Patient has similar complaints in the past. Patient denies any bladder or bowel dysfunction. Patient is rating her pain as a 10 over 10. No palliative measures taken for this complaint. Patient states she has appointment with specialist for this complaint tomorrow but could not wait secondary to the pain.   Past Medical History:  Diagnosis Date  . Anxiety   . Chronic hip pain   . Chronic pain syndrome   . Depression   . Edema   . Fatigue   . Fibromyalgia   . GERD (gastroesophageal reflux disease)   . Headache   . Hypertension   . Insomnia   . Low back pain   . Vitamin D deficiency     Patient Active Problem List   Diagnosis Date Noted  . GERD (gastroesophageal reflux disease) 08/03/2015  . Acute anxiety 08/03/2015  . Rheumatoid factor positive 08/03/2015  . Fatigue 08/03/2015  . Vitamin D deficiency 08/03/2015  . Insomnia 08/03/2015  . Fibromyalgia 08/03/2015  . Low back pain 08/03/2015  . Chronic pain syndrome 08/03/2015  . Depression 08/03/2015  . Hypertension 08/03/2015  . Cellulitis due to MRSA 08/03/2015  . Mechanical complication of internal joint prosthesis ([place]) 04/22/2012  . Arthralgia of hip 03/17/2012    Past Surgical History:  Procedure Laterality Date  . ABDOMINAL HYSTERECTOMY    . BACK SURGERY    . BREAST SURGERY    . cyst removal from wrist    . ECTOPIC PREGNANCY SURGERY    . HIP SURGERY     x4    Prior to Admission medications   Medication Sig Start Date End Date Taking?  Authorizing Provider  amLODipine (NORVASC) 5 MG tablet Take 1 tablet (5 mg total) by mouth daily. 05/29/16   Leone Haven, MD  aspirin EC 325 MG tablet Reported on 04/16/2016 07/07/15   Historical Provider, MD  clonazePAM (KLONOPIN) 0.5 MG tablet Take 1 tablet (0.5 mg total) by mouth 3 (three) times daily as needed for anxiety. Do not take within six hours of zolpidem 04/24/16   Rubbie Battiest, NP  escitalopram (LEXAPRO) 20 MG tablet Take 1 tablet (20 mg total) by mouth daily. 05/29/16   Leone Haven, MD  hydrochlorothiazide (HYDRODIURIL) 25 MG tablet Take 1 tablet (25 mg total) by mouth daily. 05/29/16   Leone Haven, MD  hydrOXYzine (ATARAX/VISTARIL) 25 MG tablet TAKE 1 TABLET BY MOUTH FOUR TIMES DAILY 07/03/16   Kathrine Haddock, NP  LYRICA 225 MG capsule Take 1 capsule (225 mg total) by mouth 2 (two) times daily. 05/29/16   Leone Haven, MD  meclizine (ANTIVERT) 12.5 MG tablet Take 1 tablet (12.5 mg total) by mouth 3 (three) times daily as needed for dizziness. 07/13/16   Drenda Freeze, MD  methylPREDNISolone (MEDROL DOSEPAK) 4 MG TBPK tablet Take Tapered dose as directed 07/19/16   Sable Feil, PA-C  oxyCODONE-acetaminophen (ROXICET) 5-325 MG tablet Take 1 tablet by mouth every 8 (eight) hours as needed for moderate pain. 07/19/16   Sable Feil, PA-C  Allergies Ace inhibitors; Penicillin g benzathine; and Tape  Family History  Problem Relation Age of Onset  . Heart disease Mother   . Cancer Father   . Alcohol abuse Father     Social History Social History  Substance Use Topics  . Smoking status: Never Smoker  . Smokeless tobacco: Never Used  . Alcohol use No    Review of Systems Constitutional: No fever/chills Eyes: No visual changes. ENT: No sore throat. Cardiovascular: Denies chest pain. Respiratory: Denies shortness of breath. Gastrointestinal: No abdominal pain.  No nausea, no vomiting.  No diarrhea.  No constipation. Genitourinary: Negative for  dysuria. Musculoskeletal: Positive for back pain. Skin: Negative for rash. Neurological: Negative for headaches, focal weakness or numbness. Radicular pain to the left lower extremity Psychiatric:Anxiety and depression Endocrine:Hypertension Allergic/Immunilogical: See medication list ____________________________________________   PHYSICAL EXAM:  VITAL SIGNS: ED Triage Vitals  Enc Vitals Group     BP 07/19/16 1335 (!) 139/94     Pulse Rate 07/19/16 1335 68     Resp 07/19/16 1335 18     Temp 07/19/16 1335 97.8 F (36.6 C)     Temp Source 07/19/16 1335 Oral     SpO2 07/19/16 1335 98 %     Weight 07/19/16 1335 232 lb (105.2 kg)     Height 07/19/16 1335 6\' 1"  (1.854 m)     Head Circumference --      Peak Flow --      Pain Score 07/19/16 1348 10     Pain Loc --      Pain Edu? --      Excl. in [place]? --     Constitutional: Alert and oriented. Well appearing and in no acute distress. Eyes: Conjunctivae are normal. PERRL. EOMI. Head: Atraumatic. Nose: No congestion/rhinnorhea. Mouth/Throat: Mucous membranes are moist.  Oropharynx non-erythematous. Neck: No stridor.  No cervical spine tenderness to palpation. Hematological/Lymphatic/Immunilogical: No cervical lymphadenopathy. Cardiovascular: Normal rate, regular rhythm. Grossly normal heart sounds.  Good peripheral circulation. Respiratory: Normal respiratory effort.  No retractions. Lungs CTAB. Gastrointestinal: Soft and nontender. No distention. No abdominal bruits. No CVA tenderness. Musculoskeletal: No obvious deformity of the lumbar spine. Patient has some moderate guarding palpation L3-L5. Patient had a positive straight leg test is 70. Neurologic:  Normal speech and language. No gross focal neurologic deficits are appreciated. No gait instability. Skin:  Skin is warm, dry and intact. No rash noted. Psychiatric: Mood and affect are normal. Speech and behavior are normal.  ____________________________________________    LABS (all labs ordered are listed, but only abnormal results are displayed)  Labs Reviewed - No data to display ____________________________________________  EKG   ____________________________________________  RADIOLOGY   ____________________________________________   PROCEDURES  Procedure(s) performed: None  Procedures  Critical Care performed: No  ____________________________________________   INITIAL IMPRESSION / ASSESSMENT AND PLAN / ED COURSE  Pertinent labs & imaging results that were available during my care of the patient were reviewed by me and considered in my medical decision making (see chart for details).  Radicular back pain to the left lower extremity. Patient given discharge care instructions. Patient advised follow-up with scheduled appointment tomorrow. Patient given a prescription for Percocet and Medrol Dosepak.  Clinical Course     ____________________________________________   FINAL CLINICAL IMPRESSION(S) / ED DIAGNOSES  Final diagnoses:  Sciatica of left side      NEW MEDICATIONS STARTED DURING THIS VISIT:  New Prescriptions   METHYLPREDNISOLONE (MEDROL DOSEPAK) 4 MG TBPK TABLET    Take Tapered  dose as directed   OXYCODONE-ACETAMINOPHEN (ROXICET) 5-325 MG TABLET    Take 1 tablet by mouth every 8 (eight) hours as needed for moderate pain.     Note:  This document was prepared using Dragon voice recognition software and may include unintentional dictation errors.    Sable Feil, PA-C 07/19/16 1432    Merlyn Lot, MD 07/19/16 1500

## 2016-07-19 NOTE — ED Triage Notes (Signed)
C/O left lower back pain radiating down to left leg x 1 week.  Patient states has had similar symptoms in the past.  Denies injury.

## 2016-07-20 ENCOUNTER — Emergency Department: Payer: Medicare HMO

## 2016-07-20 ENCOUNTER — Emergency Department
Admission: EM | Admit: 2016-07-20 | Discharge: 2016-07-20 | Disposition: A | Discharge status: Home / Self Care | Payer: Medicare HMO | Attending: Emergency Medicine | Admitting: Emergency Medicine

## 2016-07-20 DIAGNOSIS — M5136 Other intervertebral disc degeneration, lumbar region: Secondary | ICD-10-CM

## 2016-07-20 DIAGNOSIS — M545 Low back pain, unspecified: Secondary | ICD-10-CM

## 2016-07-20 DIAGNOSIS — M5126 Other intervertebral disc displacement, lumbar region: Secondary | ICD-10-CM

## 2016-07-20 DIAGNOSIS — Z7982 Long term (current) use of aspirin: Secondary | ICD-10-CM | POA: Insufficient documentation

## 2016-07-20 DIAGNOSIS — M5442 Lumbago with sciatica, left side: Secondary | ICD-10-CM | POA: Diagnosis not present

## 2016-07-20 DIAGNOSIS — I10 Essential (primary) hypertension: Secondary | ICD-10-CM | POA: Insufficient documentation

## 2016-07-20 DIAGNOSIS — Z981 Arthrodesis status: Secondary | ICD-10-CM | POA: Insufficient documentation

## 2016-07-20 DIAGNOSIS — M5432 Sciatica, left side: Secondary | ICD-10-CM

## 2016-07-20 LAB — CBC
HEMATOCRIT: 36.3 % (ref 35.0–47.0)
Hemoglobin: 12.2 g/dL (ref 12.0–16.0)
MCH: 26.5 pg (ref 26.0–34.0)
MCHC: 33.5 g/dL (ref 32.0–36.0)
MCV: 79 fL — AB (ref 80.0–100.0)
Platelets: 337 10*3/uL (ref 150–440)
RBC: 4.6 MIL/uL (ref 3.80–5.20)
RDW: 18.7 % — AB (ref 11.5–14.5)
WBC: 11.2 10*3/uL — ABNORMAL HIGH (ref 3.6–11.0)

## 2016-07-20 LAB — BASIC METABOLIC PANEL
Anion gap: 10 (ref 5–15)
BUN: 11 mg/dL (ref 6–20)
CHLORIDE: 97 mmol/L — AB (ref 101–111)
CO2: 26 mmol/L (ref 22–32)
Calcium: 9.4 mg/dL (ref 8.9–10.3)
Creatinine, Ser: 0.87 mg/dL (ref 0.44–1.00)
GFR calc Af Amer: >60 mL/min (ref >60)
GFR calc non Af Amer: >60 mL/min (ref >60)
GLUCOSE: 128 mg/dL — AB (ref 65–99)
POTASSIUM: 2.9 mmol/L — AB (ref 3.5–5.1)
Sodium: 133 mmol/L — ABNORMAL LOW (ref 135–145)

## 2016-07-20 MED ORDER — POTASSIUM CHLORIDE ER 20 MEQ PO TBCR: 20.0000 meq | EXTENDED_RELEASE_TABLET | Freq: Every day | ORAL | 0 refills | Status: DC

## 2016-07-20 MED ORDER — SODIUM CHLORIDE 0.9 % IV BOLUS (SEPSIS)
1000.0000 mL | Freq: Once | INTRAVENOUS | Status: AC
  Administered 2016-07-20: 1000 mL via INTRAVENOUS

## 2016-07-20 MED ORDER — POTASSIUM CHLORIDE CRYS ER 20 MEQ PO TBCR
40.0000 meq | EXTENDED_RELEASE_TABLET | Freq: Once | ORAL | Status: AC
  Administered 2016-07-20: 40 meq via ORAL
  Filled 2016-07-20: qty 2

## 2016-07-20 MED ORDER — ONDANSETRON HCL 4 MG/2ML IJ SOLN
4.0000 mg | Freq: Once | INTRAMUSCULAR | Status: AC
  Administered 2016-07-20: 4 mg via INTRAVENOUS
  Filled 2016-07-20: qty 2

## 2016-07-20 MED ORDER — HYDROMORPHONE HCL 1 MG/ML IJ SOLN
1.0000 mg | Freq: Once | INTRAMUSCULAR | Status: AC
  Administered 2016-07-20: 1 mg via INTRAVENOUS
  Filled 2016-07-20: qty 1

## 2016-07-20 NOTE — ED Notes (Addendum)
Pt called out asking for warm blanket. When this RN went into room to take pt warm blanket, pt tearful, stating, "Let me ask you something. My leg and foot is ice cold, feel it" while gesturing to RIGHT leg. Right and left leg/foot warm and dry, although very tips of toes are cold. Cap refill approximately 4 seconds in both right and left toes.

## 2016-07-20 NOTE — ED Provider Notes (Signed)
[facility] Emergency Department Provider Note  ____________________________________________  Time seen: Approximately 3:08 PM  I have reviewed the triage vital signs and the nursing notes.   HISTORY  Chief Complaint Back Pain    HPI Maria Hamilton is a 65 y.o. female who presents emergency department complaining of severe lower back pain with radiation down left leg. Patient reports that she has a significant history of bulging disc as well as lumbar fusion. Patient reports that she was seen in this department yesterday with back pain with radiation down her left leg. Patient was given an injection of steroids here in the emergency department and discharged home with Percocet and oral prednisone. Patient reports that pain has continued to increase in severity and is "unbearable." Patient states that the pain has radiated down her left leg and now her left leg feels "heavy and ice cold." Patient states that this pain is "worse than having a baby." Patient denies any injury to this region. She denies any bowel or bladder dysfunction, paresthesias. She denies any other complaints of fevers or chills, chest pain, shortness of breath, abdominal pain, urinary symptoms. Patient is taking steroids and Percocet at home for this complaint.   Past Medical History:  Diagnosis Date  . Anxiety   . Chronic hip pain   . Chronic pain syndrome   . Depression   . Edema   . Fatigue   . Fibromyalgia   . GERD (gastroesophageal reflux disease)   . Headache   . Hypertension   . Insomnia   . Low back pain   . Vitamin D deficiency     Patient Active Problem List   Diagnosis Date Noted  . GERD (gastroesophageal reflux disease) 08/03/2015  . Acute anxiety 08/03/2015  . Rheumatoid factor positive 08/03/2015  . Fatigue 08/03/2015  . Vitamin D deficiency 08/03/2015  . Insomnia 08/03/2015  . Fibromyalgia 08/03/2015  . Low back pain 08/03/2015  . Chronic pain syndrome  08/03/2015  . Depression 08/03/2015  . Hypertension 08/03/2015  . Cellulitis due to MRSA 08/03/2015  . Mechanical complication of internal joint prosthesis ([place]) 04/22/2012  . Arthralgia of hip 03/17/2012    Past Surgical History:  Procedure Laterality Date  . ABDOMINAL HYSTERECTOMY    . BACK SURGERY    . BREAST SURGERY    . cyst removal from wrist    . ECTOPIC PREGNANCY SURGERY    . HIP SURGERY     x4    Prior to Admission medications   Medication Sig Start Date End Date Taking? Authorizing Provider  amLODipine (NORVASC) 5 MG tablet Take 1 tablet (5 mg total) by mouth daily. 05/29/16   Leone Haven, MD  aspirin EC 325 MG tablet Reported on 04/16/2016 07/07/15   Historical Provider, MD  clonazePAM (KLONOPIN) 0.5 MG tablet Take 1 tablet (0.5 mg total) by mouth 3 (three) times daily as needed for anxiety. Do not take within six hours of zolpidem 04/24/16   Rubbie Battiest, NP  escitalopram (LEXAPRO) 20 MG tablet Take 1 tablet (20 mg total) by mouth daily. 05/29/16   Leone Haven, MD  hydrochlorothiazide (HYDRODIURIL) 25 MG tablet Take 1 tablet (25 mg total) by mouth daily. 05/29/16   Leone Haven, MD  hydrOXYzine (ATARAX/VISTARIL) 25 MG tablet TAKE 1 TABLET BY MOUTH FOUR TIMES DAILY 07/03/16   Kathrine Haddock, NP  LYRICA 225 MG capsule Take 1 capsule (225 mg total) by mouth 2 (two) times daily. 05/29/16   Leone Haven,  MD  meclizine (ANTIVERT) 12.5 MG tablet Take 1 tablet (12.5 mg total) by mouth 3 (three) times daily as needed for dizziness. 07/13/16   Drenda Freeze, MD  methylPREDNISolone (MEDROL DOSEPAK) 4 MG TBPK tablet Take Tapered dose as directed 07/19/16   Sable Feil, PA-C  oxyCODONE-acetaminophen (ROXICET) 5-325 MG tablet Take 1 tablet by mouth every 8 (eight) hours as needed for moderate pain. 07/19/16   Sable Feil, PA-C  potassium chloride 20 MEQ TBCR Take 20 mEq by mouth daily. 07/20/16   Charline Bills Cuthriell, PA-C    Allergies Ace inhibitors; Penicillin g  benzathine; and Tape  Family History  Problem Relation Age of Onset  . Heart disease Mother   . Cancer Father   . Alcohol abuse Father     Social History Social History  Substance Use Topics  . Smoking status: Never Smoker  . Smokeless tobacco: Never Used  . Alcohol use No     Review of Systems  Constitutional: No fever/chills Eyes: No visual changes.  Cardiovascular: no chest pain. Respiratory: no cough. No SOB. Gastrointestinal: No abdominal pain.  No nausea, no vomiting.  No diarrhea.  No constipation. Genitourinary: Negative for dysuria. No hematuria Musculoskeletal: Reports severe lumbar pain with radiation down left leg. Skin: Negative for rash, abrasions, lacerations, ecchymosis. Neurological: Negative for headaches, focal weakness or numbness. 10-point ROS otherwise negative.  ____________________________________________   PHYSICAL EXAM:  VITAL SIGNS: ED Triage Vitals  Enc Vitals Group     BP 07/20/16 1433 (!) 154/78     Pulse Rate 07/20/16 1433 76     Resp 07/20/16 1433 (!) 22     Temp 07/20/16 1433 98.8 F (37.1 C)     Temp Source 07/20/16 1433 Oral     SpO2 07/20/16 1433 100 %     Weight 07/20/16 1433 233 lb (105.7 kg)     Height 07/20/16 1433 6\' 1"  (1.854 m)     Head Circumference --      Peak Flow --      Pain Score 07/20/16 1436 10     Pain Loc --      Pain Edu? --      Excl. in [place]? --      Constitutional: Alert and oriented. Well appearing and in no acute distress. Eyes: Conjunctivae are normal. PERRL. EOMI. Head: Atraumatic. Neck: No stridor.  No cervical spine tenderness to palpation.  Cardiovascular: Normal rate, regular rhythm. Normal S1 and S2.  Good peripheral circulation. Respiratory: Normal respiratory effort without tachypnea or retractions. Lungs CTAB. Good air entry to the bases with no decreased or absent breath sounds. Gastrointestinal: Bowel sounds 4 quadrants. Soft and nontender to palpation. No guarding or rigidity. No  palpableOr pulsatile masses. No distention. No CVA tenderness. Musculoskeletal: No deformity noted to spine protection. Patient is extremely tender to palpation midline spinal processes in the L3-L5 region. No palpable abnormality. Patient is tender to palpation over sciatic notch. Patient has limited range of motion on the left lower extremity. Dorsalis pedis pulses appreciated bilaterally and is equal. Patient reports decreased sensation to left lower extremity. Patient is able to move all digits left foot appropriately. Neurologic:  Normal speech and language. No gross focal neurologic deficits are appreciated.  Skin:  Skin is warm, dry and intact. No rash noted. Psychiatric: Mood and affect are normal. Speech and behavior are normal. Patient exhibits appropriate insight and judgement.   ____________________________________________   LABS (all labs ordered are listed, but only abnormal results  are displayed)  Labs Reviewed  BASIC METABOLIC PANEL - Abnormal; Notable for the following:       Result Value   Sodium 133 (*)    Potassium 2.9 (*)    Chloride 97 (*)    Glucose, Bld 128 (*)    All other components within normal limits  CBC - Abnormal; Notable for the following:    WBC 11.2 (*)    MCV 79.0 (*)    RDW 18.7 (*)    All other components within normal limits  URINALYSIS COMPLETEWITH MICROSCOPIC ([facility] ONLY)   ____________________________________________  EKG   ____________________________________________  RADIOLOGY Diamantina Providence Cuthriell, personally viewed and evaluated these images (plain radiographs) as part of my medical decision making, as well as reviewing the written report by the radiologist.  Dg Lumbar Spine Complete  Result Date: 07/20/2016 CLINICAL DATA:  Low back pain for 1 week extending into the left lower extremity. EXAM: LUMBAR SPINE - COMPLETE 4+ VIEW COMPARISON:  MRI of the lumbar spine 01/06/2016. Lumbar spine radiographs 08/19/2013. FINDINGS: There is  lumbar fusion is again noted at at L3-4 and L4-5. Previous pedicle screws at L3 and L4 have been removed. 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 Lucency is noted about the L5 and S1 screws. Disc spacer is in satisfactory position. Bilateral total hip arthroplasty is noted. The soft tissues are unremarkable. IMPRESSION: 1. Interval L5-S1 fusion. 2. Lucency about the L5 and S1 pedicle screws suggests motion an loosening. 3. Solid lumbar fusion at L3-4 and L4-5. Electronically Signed   By: San Morelle M.D.   On: 07/20/2016 15:57   Mr Lumbar Spine Wo Contrast  Result Date: 07/20/2016 CLINICAL DATA:  Severe low back pain and left leg pain. EXAM: MRI LUMBAR SPINE WITHOUT CONTRAST TECHNIQUE: Multiplanar, multisequence MR imaging of the lumbar spine was performed. No intravenous contrast was administered. COMPARISON:  Radiographs dated 07/20/2016 and lumbar MRI dated 01/06/2016 FINDINGS: Segmentation:  Normal. Alignment: Minimal chronic retrolisthesis of L2 on L3, 2.5 mm. Otherwise normal. Vertebrae: Solid interbody fusion at L3-4 and L4-5. Interbody fusion device and pedicle screws at L5-S1. No fractures or bone destruction. Conus medullaris: Extends to the L1 level and appears normal. Paraspinal and other soft tissues: Postsurgical changes in the posterior soft tissues at the operative levels in the lumbar spine. Benign cyst on the lateral aspect of the right kidney, stable. Disc levels: T12-L1:  Normal. L1-2: Disc desiccation with a minimal retrolisthesis without neural impingement. No disc bulging or protrusion. Minimal degenerative changes the left facet joint. L2-3: Disc desiccation with minimal retrolisthesis. Tiny disc bulge into the left neural foramen with no neural impingement. Moderate bilateral facet arthritis, slightly progressed. L3-4: Pedicle screws have been removed. Are L4-5: Solid interbody fusion. No residual neural impingement. Widely patent neural foramina. L5-S1: New interbody and posterior  fusion. Ill-defined soft tissue fills the left neural foramen best seen on image 35 of series 6 and on images 14 of series 2, 3 and 4. This could represent scar tissue and less likely a disc fragment and could irritate the left L5 nerve. No visible solid fusion. Thecal sac is not compressed. IMPRESSION: 1. Poorly defined soft tissue fills the left neural foramen at L5-S1. This could represent postsurgical scarring or a disc fragment. This could irritate the left L5 nerve. 2. Slight progression of bilateral facet arthritis at L2-3. Electronically Signed   By: Lorriane Shire M.D.   On: 07/20/2016 16:24    ____________________________________________    PROCEDURES  Procedure(s) performed:    Procedures    Medications  HYDROmorphone (DILAUDID) injection 1 mg (1 mg Intravenous Given 07/20/16 1638)  sodium chloride 0.9 % bolus 1,000 mL (0 mLs Intravenous Stopped 07/20/16 1818)  ondansetron (ZOFRAN) injection 4 mg (4 mg Intravenous Given 07/20/16 1638)  potassium chloride SA (K-DUR,KLOR-CON) CR tablet 40 mEq (40 mEq Oral Given 07/20/16 1819)     ____________________________________________   INITIAL IMPRESSION / ASSESSMENT AND PLAN / ED COURSE  Pertinent labs & imaging results that were available during my care of the patient were reviewed by me and considered in my medical decision making (see chart for details).  Clinical Course    Patient's diagnosis is consistent with Acute on chronic lower back pain. Patient was seen in this department yesterday and diagnosed with lumbago with sciatica. Patient returns after taking one round of medication reporting that her symptoms had increased. Patient's exam was reassuring by x-ray and MRI are ordered to further evaluate patient's condition. MRI returns with potential scar tissue in the L5 region. This is consistent with patient's symptoms. No compression on the spinal column. Again, exam is reassuring. Patient was given 1 mg of Dilaudid IV in the  emergency department with good symptom control. Patient was informed of her results and that she should continue oral steroids and pain medication at home. Patient reports an improvement of her symptoms while being in the emergency department.Patient has good range of motion bilateral lower extremities. Patient is still neurovascular intact. Nursing staff had noted decrease Refill to toes. Upon reassessment by myself, care refill is less than 2 seconds. Sensation intact and equal lower extremities. Patient is ambulatory at time of discharge. Patient reports good improvement in pain level. Patient will be discharged home with instructions to follow-up with orthopedics. Labs returned with mostly reassuring results. Patient's potassium is low and she is given oral potassium in the emergency department. Patient will be discharged home with oral potassium for the next week. She is to follow-up with her primary care for repeat lab draw. Patient verbalizes understanding of her lab results, imaging results, diagnosis, and treatment plan..  Patient is given ED precautions to return to the ED for any worsening or new symptoms.     ____________________________________________  FINAL CLINICAL IMPRESSION(S) / ED DIAGNOSES  Final diagnoses:  Severe lumbar pain  Bulging lumbar disc  History of lumbar fusion  Sciatica of left side      NEW MEDICATIONS STARTED DURING THIS VISIT:  Discharge Medication List as of 07/20/2016  6:19 PM    START taking these medications   Details  potassium chloride 20 MEQ TBCR Take 20 mEq by mouth daily., Starting Fri 07/20/2016, Print            This chart was dictated using voice recognition software/Dragon. Despite best efforts to proofread, errors can occur which can change the meaning. Any change was purely unintentional.    Darletta Moll, PA-C 07/20/16 1841    Rudene Re, MD 07/21/16 [phone]

## 2016-07-20 NOTE — ED Triage Notes (Addendum)
Pt arrives to ER via POV c/o lower back pain that radiates down left side. Pt seen in ER last night for same, dx with sciatica. Pt prescribed prednisone and oxycodone, but states that the medications are not helping.   Pt tearful, states that "the medications aren't worth the money I spent" in regards to pain relief and what she was prescribed last night.

## 2016-07-20 NOTE — ED Notes (Signed)
Patient transitioned from stretcher to walker independently. Patient ambulated with use of personal walker with steady gait to the hall bathroom.

## 2016-07-20 NOTE — ED Notes (Addendum)
Pt calling out repeatedly stating, "There were people outside my door laughing. My headache was almost gone but three people were outside my door laughing. I want to talk to the administrator." At that time, this RN and Jonni Sanger, EMT-P were the only people outside of the patient's room. This RN was charting on another pt and Jonni Sanger was looking at the computer to see what rooms needed to be cleaned. This RN went in to talk with pt with Jonni Sanger and pt stating, "It was almost gone. Three people. Two girls and a man." Pt's door was closed at the time. Pt then stated, "I think my head would stop hurting again and feel better if I could just talk to an administrator." Charge RN notified and to bedside to speak with patient.

## 2016-09-20 ENCOUNTER — Emergency Department
Admission: EM | Admit: 2016-09-20 | Discharge: 2016-09-20 | Disposition: A | Discharge status: Home / Self Care | Payer: Medicare HMO | Attending: Emergency Medicine | Admitting: Emergency Medicine

## 2016-09-20 ENCOUNTER — Encounter: Payer: Self-pay | Admitting: Emergency Medicine

## 2016-09-20 DIAGNOSIS — G8929 Other chronic pain: Secondary | ICD-10-CM | POA: Insufficient documentation

## 2016-09-20 DIAGNOSIS — M545 Low back pain: Secondary | ICD-10-CM | POA: Diagnosis present

## 2016-09-20 DIAGNOSIS — Z79899 Other long term (current) drug therapy: Secondary | ICD-10-CM | POA: Insufficient documentation

## 2016-09-20 DIAGNOSIS — M5440 Lumbago with sciatica, unspecified side: Secondary | ICD-10-CM | POA: Diagnosis not present

## 2016-09-20 DIAGNOSIS — I10 Essential (primary) hypertension: Secondary | ICD-10-CM | POA: Insufficient documentation

## 2016-09-20 MED ORDER — ORPHENADRINE CITRATE 30 MG/ML IJ SOLN
60.0000 mg | Freq: Once | INTRAMUSCULAR | Status: AC
  Administered 2016-09-20: 60 mg via INTRAVENOUS

## 2016-09-20 MED ORDER — OXYCODONE-ACETAMINOPHEN 5-325 MG PO TABS: 1.0000 | ORAL_TABLET | ORAL | 0 refills | Status: DC | PRN

## 2016-09-20 MED ORDER — PREGABALIN 100 MG PO CAPS: 200.0000 mg | ORAL_CAPSULE | Freq: Three times a day (TID) | ORAL | 0 refills | Status: DC

## 2016-09-20 MED ORDER — DIAZEPAM 5 MG/ML IJ SOLN: 5.0000 mg | Freq: Once | INTRAMUSCULAR | Status: DC

## 2016-09-20 MED ORDER — HYDROMORPHONE HCL 1 MG/ML IJ SOLN
INTRAMUSCULAR | Status: AC
  Administered 2016-09-20: 1 mg via INTRAVENOUS
  Filled 2016-09-20: qty 1

## 2016-09-20 MED ORDER — HYDROMORPHONE HCL 1 MG/ML IJ SOLN
1.0000 mg | Freq: Once | INTRAMUSCULAR | Status: AC
  Administered 2016-09-20: 1 mg via INTRAVENOUS

## 2016-09-20 MED ORDER — ORPHENADRINE CITRATE 30 MG/ML IJ SOLN
INTRAMUSCULAR | Status: AC
  Administered 2016-09-20: 60 mg via INTRAVENOUS
  Filled 2016-09-20: qty 2

## 2016-09-20 NOTE — ED Provider Notes (Signed)
[facility] Emergency Department Provider Note   ____________________________________________   None    (approximate)  I have reviewed the triage vital signs and the nursing notes.   HISTORY  Chief Complaint Back Pain    HPI Maria Hamilton is a 65 y.o. female presents to emergency room with chronic back pain. Patient is very tearful during assessment states pain is much worse today than normal difficulty sleeping patient reports past medical history of at least 5 back surgeries. And has been here multiple times for the same. Describes her pain as 10 over 10 with no relief.   Past Medical History:  Diagnosis Date  . Anxiety   . Chronic hip pain   . Chronic pain syndrome   . Depression   . Edema   . Fatigue   . Fibromyalgia   . GERD (gastroesophageal reflux disease)   . Headache   . Hypertension   . Insomnia   . Low back pain   . Vitamin D deficiency     Patient Active Problem List   Diagnosis Date Noted  . GERD (gastroesophageal reflux disease) 08/03/2015  . Acute anxiety 08/03/2015  . Rheumatoid factor positive 08/03/2015  . Fatigue 08/03/2015  . Vitamin D deficiency 08/03/2015  . Insomnia 08/03/2015  . Fibromyalgia 08/03/2015  . Low back pain 08/03/2015  . Chronic pain syndrome 08/03/2015  . Depression 08/03/2015  . Hypertension 08/03/2015  . Cellulitis due to MRSA 08/03/2015  . Mechanical complication of internal joint prosthesis ([place]) 04/22/2012  . Arthralgia of hip 03/17/2012    Past Surgical History:  Procedure Laterality Date  . ABDOMINAL HYSTERECTOMY    . BACK SURGERY    . BREAST SURGERY    . cyst removal from wrist    . ECTOPIC PREGNANCY SURGERY    . HIP SURGERY     x4    Prior to Admission medications   Medication Sig Start Date End Date Taking? Authorizing Provider  amLODipine (NORVASC) 5 MG tablet Take 1 tablet (5 mg total) by mouth daily. 05/29/16   Leone Haven, MD  aspirin EC 325 MG tablet Reported  on 04/16/2016 07/07/15   Historical Provider, MD  clonazePAM (KLONOPIN) 0.5 MG tablet Take 1 tablet (0.5 mg total) by mouth 3 (three) times daily as needed for anxiety. Do not take within six hours of zolpidem 04/24/16   Rubbie Battiest, NP  escitalopram (LEXAPRO) 20 MG tablet Take 1 tablet (20 mg total) by mouth daily. 05/29/16   Leone Haven, MD  hydrochlorothiazide (HYDRODIURIL) 25 MG tablet Take 1 tablet (25 mg total) by mouth daily. 05/29/16   Leone Haven, MD  hydrOXYzine (ATARAX/VISTARIL) 25 MG tablet TAKE 1 TABLET BY MOUTH FOUR TIMES DAILY 07/03/16   Kathrine Haddock, NP  meclizine (ANTIVERT) 12.5 MG tablet Take 1 tablet (12.5 mg total) by mouth 3 (three) times daily as needed for dizziness. 07/13/16   Drenda Freeze, MD  methylPREDNISolone (MEDROL DOSEPAK) 4 MG TBPK tablet Take Tapered dose as directed 07/19/16   Sable Feil, PA-C  oxyCODONE-acetaminophen (ROXICET) 5-325 MG tablet Take 1-2 tablets by mouth every 4 (four) hours as needed for severe pain. 09/20/16   Pierce Crane Beers, PA-C  potassium chloride 20 MEQ TBCR Take 20 mEq by mouth daily. 07/20/16   Charline Bills Cuthriell, PA-C  pregabalin (LYRICA) 100 MG capsule Take 2 capsules (200 mg total) by mouth 3 (three) times daily. 09/20/16 09/20/17  Arlyss Repress, PA-C    Allergies Ace  inhibitors; Penicillin g benzathine; and Tape  Family History  Problem Relation Age of Onset  . Heart disease Mother   . Cancer Father   . Alcohol abuse Father     Social History Social History  Substance Use Topics  . Smoking status: Never Smoker  . Smokeless tobacco: Never Used  . Alcohol use No    Review of Systems Constitutional: No fever/chills Cardiovascular: Denies chest pain. Respiratory: Denies shortness of breath. Gastrointestinal: No abdominal pain.  No nausea, no vomiting.  No diarrhea.  No constipation. Genitourinary: Negative for dysuria. Musculoskeletal: Positive for back pain. Skin: Negative for rash. Neurological: Negative for  headaches, focal weakness or numbness.  10-point ROS otherwise negative.  ____________________________________________   PHYSICAL EXAM:  VITAL SIGNS: ED Triage Vitals  Enc Vitals Group     BP 09/20/16 1613 (!) 137/95     Pulse Rate 09/20/16 1613 72     Resp 09/20/16 1613 18     Temp 09/20/16 1613 97.8 F (36.6 C)     Temp src --      SpO2 09/20/16 1613 100 %     Weight 09/20/16 1611 230 lb (104.3 kg)     Height 09/20/16 1611 6\' 1"  (1.854 m)     Head Circumference --      Peak Flow --      Pain Score 09/20/16 1611 10     Pain Loc --      Pain Edu? --      Excl. in [place]? --     Constitutional: Alert and oriented. Well appearing and in no acute distress, However becomes tearful during exam. Neck: No stridor. Supple  Cardiovascular: Normal rate, regular rhythm. Grossly normal heart sounds.  Good peripheral circulation. Respiratory: Normal respiratory effort.  No retractions. Lungs CTAB. Gastrointestinal: Soft and nontender. No distention.  No CVA tenderness. Musculoskeletal: No deformity noted. Patient extremely tender to palpation midline spinal L3-L5 area. Patient has limited range of motion. Distally neurovascularly intact. Neurologic:  Normal speech and language. No gross focal neurologic deficits are appreciated. No gait instability. Skin:  Skin is warm, dry and intact. No rash noted. Psychiatric: Mood and affect are normal. Speech and behavior are normal.  ____________________________________________   LABS (all labs ordered are listed, but only abnormal results are displayed)  Labs Reviewed - No data to display ____________________________________________  EKG   ____________________________________________  RADIOLOGY  Deferred. Reviewed all previous radiological studies. ____________________________________________   PROCEDURES  Procedure(s) performed: None  Procedures  Critical Care performed:  No  ____________________________________________   INITIAL IMPRESSION / ASSESSMENT AND PLAN / ED COURSE  Pertinent labs & imaging results that were available during my care of the patient were reviewed by me and considered in my medical decision making (see chart for details).  Acute exacerbation of low back pain chronic. Patient discharged home with prescription for Lyrica and Percocet. She is to follow-up with pain management on Tuesday as scheduled.  Clinical Course   Patient with severe chronic low back pain past medical history of surgery. IV started with 60 mg of Norflex and Dilaudid 1 mg IV given. ----------------------------------------- 5:49 PM on 09/20/2016 -----------------------------------------  Reexamination of the patient 30 minutes after medication given. Patient still complaining of severe back pain 8over 10. Patient is very groggy this time. Vital signs stable. We'll discharge home to follow-up with pain management as scheduled in 5 days. ____________________________________________   FINAL CLINICAL IMPRESSION(S) / ED DIAGNOSES  Final diagnoses:  Chronic bilateral low back pain with  sciatica, sciatica laterality unspecified      NEW MEDICATIONS STARTED DURING THIS VISIT:  New Prescriptions   OXYCODONE-ACETAMINOPHEN (ROXICET) 5-325 MG TABLET    Take 1-2 tablets by mouth every 4 (four) hours as needed for severe pain.   PREGABALIN (LYRICA) 100 MG CAPSULE    Take 2 capsules (200 mg total) by mouth 3 (three) times daily.     Note:  This document was prepared using Dragon voice recognition software and may include unintentional dictation errors.   Arlyss Repress, PA-C 09/20/16 [place], MD 09/20/16 [phone]

## 2016-09-20 NOTE — ED Triage Notes (Signed)
C/O low back pain.  States was supposed to start the pain clinic on 10/9, but just can't take the pain today.

## 2016-09-20 NOTE — ED Notes (Signed)
Patient presents to the ED with chronic back pain.  Patient is tearful during assessment.  Patient states pain is much worse today than normal.  Patient reports difficulty sleeping last night.  Patient reports appointment with pain clinic Monday.  Patient reports history of 5 back surgeries.

## 2016-09-24 ENCOUNTER — Ambulatory Visit: Payer: Medicare HMO | Attending: Pain Medicine | Admitting: Pain Medicine

## 2016-09-24 ENCOUNTER — Encounter: Payer: Self-pay | Admitting: Pain Medicine

## 2016-09-24 VITALS — BP 112/76 | HR 75 | Temp 97.9°F | Resp 18 | Ht 73.0 in | Wt 228.0 lb

## 2016-09-24 DIAGNOSIS — F1190 Opioid use, unspecified, uncomplicated: Secondary | ICD-10-CM | POA: Diagnosis not present

## 2016-09-24 DIAGNOSIS — Z79891 Long term (current) use of opiate analgesic: Secondary | ICD-10-CM | POA: Insufficient documentation

## 2016-09-24 DIAGNOSIS — I10 Essential (primary) hypertension: Secondary | ICD-10-CM | POA: Diagnosis not present

## 2016-09-24 DIAGNOSIS — M545 Low back pain: Secondary | ICD-10-CM | POA: Diagnosis present

## 2016-09-24 DIAGNOSIS — Z5181 Encounter for therapeutic drug level monitoring: Secondary | ICD-10-CM | POA: Diagnosis not present

## 2016-09-24 DIAGNOSIS — M791 Myalgia: Secondary | ICD-10-CM

## 2016-09-24 DIAGNOSIS — K219 Gastro-esophageal reflux disease without esophagitis: Secondary | ICD-10-CM | POA: Diagnosis not present

## 2016-09-24 DIAGNOSIS — Z79899 Other long term (current) drug therapy: Secondary | ICD-10-CM | POA: Diagnosis not present

## 2016-09-24 DIAGNOSIS — M7918 Myalgia, other site: Secondary | ICD-10-CM | POA: Insufficient documentation

## 2016-09-24 DIAGNOSIS — M0589 Other rheumatoid arthritis with rheumatoid factor of multiple sites: Secondary | ICD-10-CM | POA: Insufficient documentation

## 2016-09-24 DIAGNOSIS — Z96643 Presence of artificial hip joint, bilateral: Secondary | ICD-10-CM | POA: Diagnosis not present

## 2016-09-24 DIAGNOSIS — M792 Neuralgia and neuritis, unspecified: Secondary | ICD-10-CM | POA: Insufficient documentation

## 2016-09-24 DIAGNOSIS — E559 Vitamin D deficiency, unspecified: Secondary | ICD-10-CM | POA: Diagnosis not present

## 2016-09-24 DIAGNOSIS — M5416 Radiculopathy, lumbar region: Secondary | ICD-10-CM

## 2016-09-24 DIAGNOSIS — M1288 Other specific arthropathies, not elsewhere classified, other specified site: Secondary | ICD-10-CM | POA: Diagnosis not present

## 2016-09-24 DIAGNOSIS — Z0189 Encounter for other specified special examinations: Secondary | ICD-10-CM

## 2016-09-24 DIAGNOSIS — M79605 Pain in left leg: Secondary | ICD-10-CM | POA: Insufficient documentation

## 2016-09-24 DIAGNOSIS — M961 Postlaminectomy syndrome, not elsewhere classified: Secondary | ICD-10-CM | POA: Diagnosis not present

## 2016-09-24 DIAGNOSIS — F419 Anxiety disorder, unspecified: Secondary | ICD-10-CM | POA: Diagnosis not present

## 2016-09-24 DIAGNOSIS — G8929 Other chronic pain: Secondary | ICD-10-CM | POA: Insufficient documentation

## 2016-09-24 DIAGNOSIS — M797 Fibromyalgia: Secondary | ICD-10-CM

## 2016-09-24 DIAGNOSIS — F329 Major depressive disorder, single episode, unspecified: Secondary | ICD-10-CM | POA: Insufficient documentation

## 2016-09-24 DIAGNOSIS — G9619 Other disorders of meninges, not elsewhere classified: Secondary | ICD-10-CM | POA: Insufficient documentation

## 2016-09-24 DIAGNOSIS — M47816 Spondylosis without myelopathy or radiculopathy, lumbar region: Secondary | ICD-10-CM

## 2016-09-24 DIAGNOSIS — G96198 Other disorders of meninges, not elsewhere classified: Secondary | ICD-10-CM | POA: Insufficient documentation

## 2016-09-24 DIAGNOSIS — M0579 Rheumatoid arthritis with rheumatoid factor of multiple sites without organ or systems involvement: Secondary | ICD-10-CM

## 2016-09-24 NOTE — Progress Notes (Signed)
Safety precautions to be maintained throughout the outpatient stay will include: orient to surroundings, keep bed in low position, maintain call bell within reach at all times, provide assistance with transfer out of bed and ambulation.  
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
## 2016-09-24 NOTE — Patient Instructions (Addendum)
[facility] mall for blood work as soon as possible.

## 2016-09-25 ENCOUNTER — Other Ambulatory Visit
Admission: RE | Admit: 2016-09-25 | Discharge: 2016-09-25 | Disposition: A | Discharge status: Home / Self Care | Payer: Medicare HMO | Source: Ambulatory Visit | Attending: Pain Medicine | Admitting: Pain Medicine

## 2016-09-25 ENCOUNTER — Telehealth: Payer: Self-pay

## 2016-09-25 ENCOUNTER — Emergency Department
Admission: EM | Admit: 2016-09-25 | Discharge: 2016-09-25 | Disposition: A | Discharge status: Home / Self Care | Payer: Medicare HMO | Attending: Emergency Medicine | Admitting: Emergency Medicine

## 2016-09-25 DIAGNOSIS — F418 Other specified anxiety disorders: Secondary | ICD-10-CM | POA: Insufficient documentation

## 2016-09-25 DIAGNOSIS — Z79891 Long term (current) use of opiate analgesic: Secondary | ICD-10-CM

## 2016-09-25 DIAGNOSIS — Z79899 Other long term (current) drug therapy: Secondary | ICD-10-CM | POA: Diagnosis not present

## 2016-09-25 DIAGNOSIS — I10 Essential (primary) hypertension: Secondary | ICD-10-CM | POA: Diagnosis not present

## 2016-09-25 DIAGNOSIS — G8929 Other chronic pain: Secondary | ICD-10-CM

## 2016-09-25 DIAGNOSIS — E559 Vitamin D deficiency, unspecified: Secondary | ICD-10-CM

## 2016-09-25 DIAGNOSIS — M545 Low back pain, unspecified: Secondary | ICD-10-CM

## 2016-09-25 DIAGNOSIS — F1190 Opioid use, unspecified, uncomplicated: Secondary | ICD-10-CM

## 2016-09-25 LAB — COMPREHENSIVE METABOLIC PANEL
ALBUMIN: 4.2 g/dL (ref 3.5–5.0)
ALT: 32 U/L (ref 14–54)
ANION GAP: 11 (ref 5–15)
AST: 30 U/L (ref 15–41)
Alkaline Phosphatase: 96 U/L (ref 38–126)
BILIRUBIN TOTAL: 0.6 mg/dL (ref 0.3–1.2)
BUN: 10 mg/dL (ref 6–20)
CHLORIDE: 97 mmol/L — AB (ref 101–111)
CO2: 29 mmol/L (ref 22–32)
Calcium: 9.7 mg/dL (ref 8.9–10.3)
Creatinine, Ser: 0.89 mg/dL (ref 0.44–1.00)
GFR calc Af Amer: >60 mL/min (ref >60)
GFR calc non Af Amer: >60 mL/min (ref >60)
GLUCOSE: 119 mg/dL — AB (ref 65–99)
POTASSIUM: 2.5 mmol/L — AB (ref 3.5–5.1)
SODIUM: 137 mmol/L (ref 135–145)
TOTAL PROTEIN: 9 g/dL — AB (ref 6.5–8.1)

## 2016-09-25 LAB — MAGNESIUM: MAGNESIUM: 1.9 mg/dL (ref 1.7–2.4)

## 2016-09-25 LAB — C-REACTIVE PROTEIN: CRP: <0.8 mg/dL (ref <1.0)

## 2016-09-25 LAB — VITAMIN B12: VITAMIN B 12: 269 pg/mL (ref 180–914)

## 2016-09-25 LAB — SEDIMENTATION RATE: SED RATE: 39 mm/h — AB (ref 0–30)

## 2016-09-25 MED ORDER — HYDROMORPHONE HCL 1 MG/ML IJ SOLN
1.0000 mg | Freq: Once | INTRAMUSCULAR | Status: AC
  Administered 2016-09-25: 1 mg via INTRAMUSCULAR
  Filled 2016-09-25: qty 1

## 2016-09-25 MED ORDER — KETOROLAC TROMETHAMINE 60 MG/2ML IM SOLN
30.0000 mg | Freq: Once | INTRAMUSCULAR | Status: AC
  Administered 2016-09-25: 30 mg via INTRAMUSCULAR
  Filled 2016-09-25: qty 2

## 2016-09-25 MED ORDER — ORPHENADRINE CITRATE 30 MG/ML IJ SOLN
60.0000 mg | Freq: Two times a day (BID) | INTRAMUSCULAR | Status: DC
  Administered 2016-09-25: 60 mg via INTRAMUSCULAR
  Filled 2016-09-25: qty 2

## 2016-09-25 MED ORDER — OXYCODONE-ACETAMINOPHEN 7.5-325 MG PO TABS: 1.0000 | ORAL_TABLET | Freq: Four times a day (QID) | ORAL | 0 refills | Status: DC | PRN

## 2016-09-25 NOTE — Progress Notes (Signed)
Potassium levels below 3.6 mmol/L are considered to be low. Levels (less than 2.5 mmol/L) can be life-threatening and requires urgent medical attention. Low potassium (hypokalemia) has many causes. The most common cause is excessive potassium loss in urine due to prescription water or fluid pills (diuretics). Vomiting or diarrhea or both can result in excessive potassium loss from the digestive tract. Causes of potassium loss leading to low potassium include: chronic kidney disease; diabetic ketoacidosis; diarrhea; excessive alcohol use; excessive laxative use; excessive sweating; folic acid deficiency; diuretics; primary aldosteronism; vomiting; and/or some antibiotic use.  Normal chloride levels are between 95 and 107 mEq/L. Low levels may be due to: Addison disease; Bartter syndrome; burns; congestive heart failure; dehydration; excessive sweating; hyperaldosteronism; metabolic alkalosis; respiratory acidosis (compensated); Syndrome of inappropriate diuretic hormone secretion (SIADH); or vomiting.  - Normal fasting (NPO x 8 hours) glucose levels are between 65-99 mg/dl, with 2 hour fasting, levels are usually less than 140 mg/dl. Any random blood glucose level greater than 200 mg/dl is considered to be Diabetes.  - Results of a total protein test are usually considered along with those from other tests of the CMP and will give the healthcare practitioner information on a person's general health status with regard to nutrition and/or conditions involving major organs, such as the kidney and liver. - A high total protein level may be seen with chronic inflammation or infections such as viral hepatitis or HIV. It also may be associated with bone marrow disorders such as multiple myeloma.  - Possible causes of high blood protein include: Bone marrow disorder Multiple myeloma Amyloidosis Monoclonal gammopathy of undetermined significance (MGUS) Chronic inflammatory conditions HIV/AIDS Dehydration  (which may make blood proteins appear falsely elevated)  - A high-protein diet doesn't cause high blood protein. - High blood protein is not a specific disease or condition in itself. It's usually a laboratory finding uncovered during the evaluation of a particular condition or symptom. For instance, although high blood protein is found in people who are dehydrated, the real problem is that the blood plasma is actually more concentrated. - Certain proteins in the blood may be elevated as your body fights an infection or some other inflammation. People with certain bone marrow diseases, such as multiple myeloma, may have high blood protein levels before they show any other symptoms.

## 2016-09-25 NOTE — Telephone Encounter (Signed)
Received a call from lab at [facility] of a critical potassium of 2.5. Faxed report to PCP. Dr. Dossie Arbour notified.

## 2016-09-25 NOTE — Progress Notes (Signed)
-  A normal sedimentation rate should be below 30 mm/hr. The sed rate is an acute phase reactant that indirectly measures the degree of inflammation present in the body. It can be acute, developing rapidly after trauma, injury or infection, for example, or can occur over an extended time (chronic) with conditions such as autoimmune diseases or cancer. The ESR is not diagnostic; it is a non-specific, screening test that may be elevated in a number of these different conditions. It provides general information about the presence or absence of an inflammatory condition. - The combined elevation of the ESR & CRP, may be suggestive of an autoimmune disease. Should this be the case, we will inquire if the patient has had a rheumatologic evaluation looking at  the RF levels, ANA levels, and CBC.

## 2016-09-25 NOTE — ED Notes (Signed)
Pt has a history of back pain with last surgery in 2016 - pt lower back has been hurting for the past few days - she was seen at the pain clinic today and they took blood but did not give her any pain medication - the pain is in the lower left side of back running into hip through leg to foot - she is having sharp/shooting pain and numbness - denies urinary incontinence - pt is able to bear some weight on leg (3-4 steps)

## 2016-09-25 NOTE — ED Triage Notes (Signed)
Pt has a history of back pain with last surgery in 2016 - pt lower back has been hurting for the past few days - she was seen at the pain clinic today and they took blood but did not give her any pain medication - the pain is in the lower left side of back running into hip through leg to foot - she is having sharp/shooting pain and numbness - denies urinary incontinence - pt is able to bear some weight on leg (3-4 steps)

## 2016-09-25 NOTE — ED Provider Notes (Signed)
[facility] Emergency Department Provider Note   ____________________________________________   First MD Initiated Contact with Patient 09/25/16 1955     (approximate)  I have reviewed the triage vital signs and the nursing notes.   HISTORY  Chief Complaint Back Pain    HPI Maria Hamilton is a 65 y.o. female patient here for acute back pain. Patient has a long history of back pain. Patient last surgery was done in 2016. Patient states she has initiated pain management. Patient states she went to the clinic today and they drew blood but did not give her any pain medication. Patient states she is pending a psychological evaluation before being fully except in the pain management. Patient had imaging done of the lumbar spine on 07/20/2016. X-ray findings were suggestive of loosening of the pedicle screws at L5 and S1. Patient states she is to follow-up tomorrow for lab results. Patient rates her pain as a 10 over 10. Patient denies any radicular component to this pain. Patient denies any bladder or bowel dysfunction. Patient described the pain as sharp. Past Medical History:  Diagnosis Date  . Anxiety   . Chronic hip pain   . Chronic pain syndrome   . Depression   . Edema   . Fatigue   . Fibromyalgia   . GERD (gastroesophageal reflux disease)   . Headache   . Hypertension   . Insomnia   . Low back pain   . Vitamin D deficiency     Patient Active Problem List   Diagnosis Date Noted  . Long term current use of opiate analgesic 09/24/2016  . Long term prescription opiate use 09/24/2016  . Opiate use 09/24/2016  . Encounter for therapeutic drug level monitoring 09/24/2016  . Encounter for pain management planning 09/24/2016  . Rheumatoid arthritis, multiple sites (positive RF) 09/24/2016  . Chronic lower extremity pain (Location of Secondary source of pain) (Left) 09/24/2016  . Lumbar facet arthropathy (Bilateral) 09/24/2016  . Failed back surgical  syndrome (x 3) 09/24/2016  . Epidural fibrosis 09/24/2016  . Neurogenic pain 09/24/2016  . Musculoskeletal pain 09/24/2016  . Chronic lumbar radicular pain (L5/S1 dermatome) (Location of Secondary source of pain) (Left) 09/24/2016  . History of total hip replacement, bilateral 09/24/2016  . Chronic pain 09/21/2016  . GERD (gastroesophageal reflux disease) 08/03/2015  . Acute anxiety 08/03/2015  . Rheumatoid factor positive 08/03/2015  . Fatigue 08/03/2015  . Vitamin D deficiency 08/03/2015  . Insomnia 08/03/2015  . Fibromyalgia 08/03/2015  . Chronic low back pain (Location of Primary Source of Pain) (Left) 08/03/2015  . Chronic pain syndrome 08/03/2015  . Depression 08/03/2015  . Hypertension 08/03/2015  . Cellulitis due to MRSA 08/03/2015  . Mechanical complication of internal joint prosthesis ([place]) 04/22/2012    Past Surgical History:  Procedure Laterality Date  . ABDOMINAL HYSTERECTOMY    . BACK SURGERY    . BREAST SURGERY    . cyst removal from wrist    . ECTOPIC PREGNANCY SURGERY    . HIP SURGERY     x4    Prior to Admission medications   Medication Sig Start Date End Date Taking? Authorizing Provider  amLODipine (NORVASC) 5 MG tablet Take 1 tablet (5 mg total) by mouth daily. 05/29/16   Leone Haven, MD  escitalopram (LEXAPRO) 20 MG tablet Take 1 tablet (20 mg total) by mouth daily. 05/29/16   Leone Haven, MD  hydrochlorothiazide (HYDRODIURIL) 25 MG tablet Take 1 tablet (25 mg total) by  mouth daily. 05/29/16   Leone Haven, MD  HYDROcodone-acetaminophen (NORCO/VICODIN) 5-325 MG tablet TK 1 TO 2 TS PO Q 6 H PRF PAIN 07/05/16   Historical Provider, MD  oxyCODONE-acetaminophen (PERCOCET) 7.5-325 MG tablet Take 1 tablet by mouth every 6 (six) hours as needed for severe pain. 09/25/16   Sable Feil, PA-C    Allergies Ace inhibitors; Penicillin g benzathine; and Tape  Family History  Problem Relation Age of Onset  . Heart disease Mother   . Cancer Father    . Alcohol abuse Father     Social History Social History  Substance Use Topics  . Smoking status: Never Smoker  . Smokeless tobacco: Never Used  . Alcohol use No    Review of Systems Constitutional: No fever/chills Eyes: No visual changes. ENT: No sore throat. Cardiovascular: Denies chest pain. Respiratory: Denies shortness of breath. Gastrointestinal: No abdominal pain.  No nausea, no vomiting.  No diarrhea.  No constipation. Genitourinary: Negative for dysuria. Musculoskeletal: Chronic back pain  Skin: Negative for rash. Neurological: Negative for headaches, focal weakness or numbness. Psychiatric: Anxiety and depression Endocrine:Hypertension Allergic/Immunilogical: See medication list ____________________________________________   PHYSICAL EXAM:  VITAL SIGNS: ED Triage Vitals  Enc Vitals Group     BP 09/25/16 1913 101/77     Pulse Rate 09/25/16 1913 77     Resp 09/25/16 1913 16     Temp 09/25/16 1913 98.7 F (37.1 C)     Temp Source 09/25/16 1913 Oral     SpO2 09/25/16 1913 98 %     Weight 09/25/16 1912 233 lb (105.7 kg)     Height 09/25/16 1912 6\' 1"  (1.854 m)     Head Circumference --      Peak Flow --      Pain Score 09/25/16 1912 10     Pain Loc --      Pain Edu? --      Excl. in [place]? --     Constitutional: Alert and oriented. Well appearing and in no acute distress. Eyes: Conjunctivae are normal. PERRL. EOMI. Head: Atraumatic. Nose: No congestion/rhinnorhea. Mouth/Throat: Mucous membranes are moist.  Oropharynx non-erythematous. Neck: No stridor.  No cervical spine tenderness to palpation. Hematological/Lymphatic/Immunilogical: No cervical lymphadenopathy. Cardiovascular: Normal rate, regular rhythm. Grossly normal heart sounds.  Good peripheral circulation. Respiratory: Normal respiratory effort.  No retractions. Lungs CTAB. Gastrointestinal: Soft and nontender. No distention. No abdominal bruits. No CVA tenderness. Musculoskeletal:Surgical  scars consistent with history. Patient has some moderate guarding from L4-S1. Patient decreased range of motion all fields. Patient has a negative straight leg test. Neurologic:  Normal speech and language. No gross focal neurologic deficits are appreciated. No gait instability. Skin:  Skin is warm, dry and intact. No rash noted. Lumbar surgical scars consistent with history.  Psychiatric: Mood and affect are normal. Speech and behavior are normal.  ____________________________________________   LABS (all labs ordered are listed, but only abnormal results are displayed)  Labs Reviewed - No data to display ____________________________________________  EKG   ____________________________________________  RADIOLOGY  Reviewed x-ray and MRI taken on a 07/20/2016. ____________________________________________   PROCEDURES  Procedure(s) performed: None  Procedures  Critical Care performed: No  ____________________________________________   INITIAL IMPRESSION / ASSESSMENT AND PLAN / ED COURSE  Pertinent labs & imaging results that were available during my care of the patient were reviewed by me and considered in my medical decision making (see chart for details).  Chronic low back pain. Patient given discharge Instructions. Patient advised  to follow-up with pain management clinic tomorrow for lab results and continue care. Patient given prescription for 10 Percocets.  Clinical Course     ____________________________________________   FINAL CLINICAL IMPRESSION(S) / ED DIAGNOSES  Final diagnoses:  Acute midline low back pain without sciatica      NEW MEDICATIONS STARTED DURING THIS VISIT:  New Prescriptions   OXYCODONE-ACETAMINOPHEN (PERCOCET) 7.5-325 MG TABLET    Take 1 tablet by mouth every 6 (six) hours as needed for severe pain.     Note:  This document was prepared using Dragon voice recognition software and may include unintentional dictation errors.      Sable Feil, PA-C 09/25/16 2007    Harvest Dark, MD 09/25/16 2225

## 2016-09-26 ENCOUNTER — Other Ambulatory Visit: Payer: Self-pay | Admitting: Family Medicine

## 2016-09-26 MED ORDER — POTASSIUM CHLORIDE CRYS ER 20 MEQ PO TBCR: 40.0000 meq | EXTENDED_RELEASE_TABLET | Freq: Two times a day (BID) | ORAL | 0 refills | Status: DC

## 2016-09-27 ENCOUNTER — Encounter: Payer: Self-pay | Admitting: Family Medicine

## 2016-09-27 ENCOUNTER — Ambulatory Visit (INDEPENDENT_AMBULATORY_CARE_PROVIDER_SITE_OTHER): Payer: Medicare HMO | Admitting: Family Medicine

## 2016-09-27 VITALS — BP 110/80 | HR 79 | Temp 98.5°F | Wt 218.6 lb

## 2016-09-27 DIAGNOSIS — G8929 Other chronic pain: Secondary | ICD-10-CM

## 2016-09-27 DIAGNOSIS — E876 Hypokalemia: Secondary | ICD-10-CM | POA: Diagnosis not present

## 2016-09-27 DIAGNOSIS — M5442 Lumbago with sciatica, left side: Secondary | ICD-10-CM

## 2016-09-27 LAB — BASIC METABOLIC PANEL
BUN: 7 mg/dL (ref 6–23)
CALCIUM: 9.6 mg/dL (ref 8.4–10.5)
CO2: 32 meq/L (ref 19–32)
CREATININE: 0.91 mg/dL (ref 0.40–1.20)
Chloride: 97 mEq/L (ref 96–112)
GFR: 79.67 mL/min (ref >60.00)
Glucose, Bld: 100 mg/dL — ABNORMAL HIGH (ref 70–99)
Potassium: 3.2 mEq/L — ABNORMAL LOW (ref 3.5–5.1)
SODIUM: 137 meq/L (ref 135–145)

## 2016-09-27 NOTE — Assessment & Plan Note (Signed)
Found on recent lab work. She has discontinued her hydrochlorothiazide. Her blood blood pressure is in the normal range. She is taking potassium supplements. We'll check this today. Return precautions given.

## 2016-09-27 NOTE — Progress Notes (Signed)
Pre visit review using our clinic review tool, if applicable. No additional management support is needed unless otherwise documented below in the visit note. 

## 2016-09-27 NOTE — Assessment & Plan Note (Signed)
Acute on chronic issue. Has some decreased sensation in her left lower extremity that she reports has been present for several months. Recent MRI reviewed. She has a follow-up with her surgeon scheduled for next week. I discussed keeping this. I advised that given all the medication she has tried for pain I am unsure what to give her at this time. She is to follow-up with her surgeon for further management. Given return precautions.

## 2016-09-27 NOTE — Patient Instructions (Signed)
Nice to see you. We are going to recheck some lab work today. Please continue to hold your hydrochlorothiazide. You already have an appointment scheduled with your surgeon next week. Please keep this appointment. If you develop new numbness, weakness, loss of bowel or bladder function, numbness between your legs, fevers, or any new or changing symptoms please seek medical attention immediately.

## 2016-09-27 NOTE — Progress Notes (Signed)
[name], MD Phone: [phone]  Maria Hamilton is a 65 y.o. female who presents today for follow-up.  Patient presents for follow-up of hypokalemia. Was seen at the pain clinic and had lab work that revealed a potassium of 2.5. I called in potassium supplements yesterday and she took this yesterday though has not taken it today. I advised her to hold her hydrochlorothiazide. Her blood pressure has been good. She has no chest pain or palpitations.  She has chronic low back pain. It appears she's been seen in the ED multiple times over the last several months for acute exacerbations of this. She notes pain in her bilateral low back radiating down her left leg to her toes. Notes her leg feels cold below her knee. Notes decreased sensation from her mid shin down to her left foot. No weakness. No saddle anesthesia, loss of bowel or bladder function, or fevers. On review of records it appears that she had an MRI of her lumbar spine with possible L5 nerve root compression and slight progression of her bilateral facet arthritis at L2-3. It appears that there was no visible solid fusion from her prior surgery at that time. She notes continued low back pain radiating down her left leg. Notes decreased sensation has been present for at least 2 months. She has tried Percocet, Vicodin, tramadol, diclofenac, meloxicam, and prednisone with no benefit over the last several months. She has not followed up with her surgeon recently. She is intermittently tearful in the office and states this is related to her pain. She notes no depression. She notes no thoughts of hurting herself.  ROS see history of present illness  Objective  Physical Exam Vitals:   09/27/16 1111  BP: 110/80  Pulse: 79  Temp: 98.5 F (36.9 C)    BP Readings from Last 3 Encounters:  09/27/16 110/80  09/25/16 101/77  09/24/16 112/76   Wt Readings from Last 3 Encounters:  09/27/16 218 lb 9.6 oz (99.2 kg)  09/25/16 233 lb  (105.7 kg)  09/24/16 228 lb (103.4 kg)    Physical Exam  Constitutional: She is well-developed, well-nourished, and in no distress.  Cardiovascular: Normal rate, regular rhythm and normal heart sounds.   Bilateral feet are warm and well-perfused with capillary refill less than 2 seconds, 2+ DP pulses bilaterally  Pulmonary/Chest: Effort normal and breath sounds normal.  Musculoskeletal:  No midline spine tenderness, no spine step-off, right lumbar muscular back tenderness, no overlying skin changes, scarring noted from prior surgeries  Neurological: She is alert.  5 out of 5 strength bilateral quads, hamstrings, plantar flexion, and dorsiflexion, decreased sensation to light touch left lower extremity from her mid shin down to her foot, otherwise sensation light touch intact in bilateral lower extremities  Skin: Skin is warm and dry.     Assessment/Plan: Please see individual problem list.  Chronic low back pain (Location of Primary Source of Pain) (Left) Acute on chronic issue. Has some decreased sensation in her left lower extremity that she reports has been present for several months. Recent MRI reviewed. She has a follow-up with her surgeon scheduled for next week. I discussed keeping this. I advised that given all the medication she has tried for pain I am unsure what to give her at this time. She is to follow-up with her surgeon for further management. Given return precautions.  Hypokalemia Found on recent lab work. She has discontinued her hydrochlorothiazide. Her blood blood pressure is in the normal range. She is taking potassium  supplements. We'll check this today. Return precautions given.   Orders Placed This Encounter  Procedures  . Basic Metabolic Panel (BMET)    [name], MD [place]

## 2016-09-28 LAB — 25-HYDROXY VITAMIN D LCMS D2+D3
25-Hydroxy, Vitamin D-2: 6.4 ng/mL
25-Hydroxy, Vitamin D-3: 7.7 ng/mL

## 2016-09-28 LAB — 25-HYDROXYVITAMIN D LCMS D2+D3: 25-HYDROXY, VITAMIN D: 14 ng/mL — AB

## 2016-09-29 ENCOUNTER — Emergency Department
Admission: EM | Admit: 2016-09-29 | Discharge: 2016-09-29 | Disposition: A | Discharge status: Home / Self Care | Payer: Medicare HMO | Attending: Emergency Medicine | Admitting: Emergency Medicine

## 2016-09-29 ENCOUNTER — Encounter: Payer: Self-pay | Admitting: Emergency Medicine

## 2016-09-29 DIAGNOSIS — M545 Low back pain: Secondary | ICD-10-CM | POA: Insufficient documentation

## 2016-09-29 DIAGNOSIS — I10 Essential (primary) hypertension: Secondary | ICD-10-CM | POA: Insufficient documentation

## 2016-09-29 DIAGNOSIS — M25552 Pain in left hip: Secondary | ICD-10-CM | POA: Diagnosis present

## 2016-09-29 DIAGNOSIS — G8929 Other chronic pain: Secondary | ICD-10-CM | POA: Diagnosis not present

## 2016-09-29 MED ORDER — OXYCODONE-ACETAMINOPHEN 10-325 MG PO TABS: 1.0000 | ORAL_TABLET | Freq: Three times a day (TID) | ORAL | 0 refills | Status: AC | PRN

## 2016-09-29 MED ORDER — OXYCODONE-ACETAMINOPHEN 5-325 MG PO TABS
2.0000 | ORAL_TABLET | Freq: Once | ORAL | Status: AC
  Administered 2016-09-29: 2 via ORAL
  Filled 2016-09-29: qty 2

## 2016-09-29 NOTE — ED Notes (Signed)
See triage note  States she is having "unbearable" pain to left hip area is scheduled for Ortho appt next week   States percocet ,ice and heat not helping the pain  Pt is lethargic  Answers questions approp

## 2016-09-29 NOTE — ED Provider Notes (Signed)
[facility] Emergency Department Provider Note  ____________________________________________  Time seen: Approximately 2:51 PM  I have reviewed the triage vital signs and the nursing notes.   HISTORY  Chief Complaint Hip Pain    HPI Maria Hamilton is a 65 y.o. female , NAD, presents to the emergency department with complaint of left hip and leg pain. States she has chronic lower back pain that radiates into the left lower extremity. Was seen in this emergency department 4 days ago for the same symptoms and given Percocet which she states helped with her pain but did not alleviate the pain. Patient was also seen by her primary care provider 2 days ago for same follow-up of chronic pain but was not given anything to help. Patient notes she was told by her pain management specialist that there was "a screw loose in her left hip". Patient has an appointment with her orthopedic surgeon, Dr. Ma Rings, on Tuesday for follow-up of this issue noted on MRI. Patient states her pain is excruciating causing her not to be able to rest. She feels fatigue due to lack of sleep. States she would prefer to "cut her leg off" rather than continue with the pain. Patient denies any suicidal or homicidal ideation. Denies any depressive symptoms. States she is just in pain. Patient denies any numbness, weakness, tingling. No saddle paresthesias nor loss of bowel or bladder control. No recent injuries, traumas or falls to exacerbate her pain. Patient does note a chronic "cold sensation" in her left lower leg that is unchanged.   Past Medical History:  Diagnosis Date  . Anxiety   . Chronic hip pain   . Chronic pain syndrome   . Depression   . Edema   . Fatigue   . Fibromyalgia   . GERD (gastroesophageal reflux disease)   . Headache   . Hypertension   . Insomnia   . Low back pain   . Vitamin D deficiency     Patient Active Problem List   Diagnosis Date Noted  . Hypokalemia 09/27/2016   . Long term current use of opiate analgesic 09/24/2016  . Long term prescription opiate use 09/24/2016  . Opiate use 09/24/2016  . Encounter for therapeutic drug level monitoring 09/24/2016  . Encounter for pain management planning 09/24/2016  . Rheumatoid arthritis, multiple sites (positive RF) 09/24/2016  . Chronic lower extremity pain (Location of Secondary source of pain) (Left) 09/24/2016  . Lumbar facet arthropathy (Bilateral) 09/24/2016  . Failed back surgical syndrome (x 3) 09/24/2016  . Epidural fibrosis 09/24/2016  . Neurogenic pain 09/24/2016  . Musculoskeletal pain 09/24/2016  . Chronic lumbar radicular pain (L5/S1 dermatome) (Location of Secondary source of pain) (Left) 09/24/2016  . History of total hip replacement, bilateral 09/24/2016  . Chronic pain 09/21/2016  . GERD (gastroesophageal reflux disease) 08/03/2015  . Acute anxiety 08/03/2015  . Rheumatoid factor positive 08/03/2015  . Fatigue 08/03/2015  . Vitamin D deficiency 08/03/2015  . Insomnia 08/03/2015  . Fibromyalgia 08/03/2015  . Chronic low back pain (Location of Primary Source of Pain) (Left) 08/03/2015  . Chronic pain syndrome 08/03/2015  . Depression 08/03/2015  . Hypertension 08/03/2015  . Cellulitis due to MRSA 08/03/2015  . Mechanical complication of internal joint prosthesis ([place]) 04/22/2012    Past Surgical History:  Procedure Laterality Date  . ABDOMINAL HYSTERECTOMY    . BACK SURGERY    . BREAST SURGERY    . cyst removal from wrist    . ECTOPIC PREGNANCY SURGERY    .  HIP SURGERY     x4    Prior to Admission medications   Medication Sig Start Date End Date Taking? Authorizing Provider  amLODipine (NORVASC) 5 MG tablet Take 1 tablet (5 mg total) by mouth daily. 05/29/16  Yes Leone Haven, MD  escitalopram (LEXAPRO) 20 MG tablet Take 1 tablet (20 mg total) by mouth daily. 05/29/16  Yes Leone Haven, MD  hydrochlorothiazide (HYDRODIURIL) 25 MG tablet Take 1 tablet (25 mg total) by  mouth daily. 05/29/16  Yes Leone Haven, MD  potassium chloride SA (K-DUR,KLOR-CON) 20 MEQ tablet Take 2 tablets (40 mEq total) by mouth 2 (two) times daily. 09/26/16  Yes Leone Haven, MD  oxyCODONE-acetaminophen (PERCOCET) 10-325 MG tablet Take 1 tablet by mouth every 8 (eight) hours as needed for pain. 09/29/16 10/02/16  Medardo Hassing L Karrin Eisenmenger, PA-C    Allergies Ace inhibitors; Penicillin g benzathine; and Tape  Family History  Problem Relation Age of Onset  . Heart disease Mother   . Cancer Father   . Alcohol abuse Father     Social History Social History  Substance Use Topics  . Smoking status: Never Smoker  . Smokeless tobacco: Never Used  . Alcohol use No     Review of Systems  Constitutional: Positive fatigue. No fever/chills Cardiovascular: No chest pain. Respiratory: No shortness of breath.  Gastrointestinal: No abdominal pain.  No nausea, vomiting. Genitourinary: Negative for dysuria, hematuria. No urinary hesitancy, urgency or increased frequency. Musculoskeletal: Positive for chronic back and left lower extremity pain.  Skin: Negative for rash, skin sores, redness, swelling.  Neurological: Positive "cold sensation" left lower extremity that is chronic. Negative for numbness, weakness or tingling. No saddle paresthesias or loss of bowel or bladder control. 10-point ROS otherwise negative.  ____________________________________________   PHYSICAL EXAM:  VITAL SIGNS: ED Triage Vitals  Enc Vitals Group     BP 09/29/16 1422 (!) 146/92     Pulse Rate 09/29/16 1422 65     Resp 09/29/16 1422 18     Temp 09/29/16 1422 98.2 F (36.8 C)     Temp Source 09/29/16 1422 Oral     SpO2 09/29/16 1422 99 %     Weight 09/29/16 1424 233 lb (105.7 kg)     Height 09/29/16 1424 6\' 1"  (1.854 m)     Head Circumference --      Peak Flow --      Pain Score 09/29/16 1424 9     Pain Loc --      Pain Edu? --      Excl. in [place]? --      Constitutional: Alert and oriented.  Tearful at times, fatigue appearing and in no acute distress but in pain. Eyes: Conjunctivae are normal.  Head: Atraumatic. Neck: Supple with full range of motion. Cardiovascular: Normal rate, regular rhythm. Normal S1 and S2.  Good peripheral circulation with 2+ pulses noted in bilateral lower extremities. Respiratory: Normal respiratory effort without tachypnea or retractions. Lungs CTAB with breath sounds noted in all lung fields. Musculoskeletal: No lower extremity tenderness nor edema.  No joint effusions. Neurologic:  Normal speech and language. No gross focal neurologic deficits are appreciated.  Skin:  Skin is warm, dry and intact. No rash noted. Psychiatric: Mood and affect are normal. Speech and behavior are normal. Patient exhibits appropriate insight and judgement.   ____________________________________________   LABS  None ____________________________________________  EKG  None ____________________________________________  RADIOLOGY  None ____________________________________________    PROCEDURES  Procedure(s) performed:  None   Procedures   Medications  oxyCODONE-acetaminophen (PERCOCET/ROXICET) 5-325 MG per tablet 2 tablet (2 tablets Oral Given 09/29/16 1520)     ____________________________________________   INITIAL IMPRESSION / ASSESSMENT AND PLAN / ED COURSE  Pertinent labs & imaging results that were available during my care of the patient were reviewed by me and considered in my medical decision making (see chart for details).  Clinical Course  Comment By Time  Patient notes that her pain has significantly eased off with administration of Percocet. I explained to the patient I would give her a few tablets of Percocet to take over the next few days to hopefully get her to her appointment with her orthopedic surgeon, Dr. Ma Rings.  Braxton Feathers, PA-C 10/14 1612    Patient's diagnosis is consistent with chronic lower back pain with left-sided  lower extremity pain. Patient will be discharged home with prescriptions for Percocet to take sparingly as needed for severe pain. Patient understands that she will need to follow up with her Orthopedic surgeon, PCP or the pain clinic if she needs any further pain treatment. Patient is to follow up with Dr. Wynn Banker in orthopedics as currently scheduled on Tuesday. Patient is given ED precautions to return to the ED for any worsening or new symptoms.    ____________________________________________  FINAL CLINICAL IMPRESSION(S) / ED DIAGNOSES  Final diagnoses:  Chronic left-sided low back pain, with sciatica presence unspecified      NEW MEDICATIONS STARTED DURING THIS VISIT:  Discharge Medication List as of 09/29/2016  4:15 PM    START taking these medications   Details  oxyCODONE-acetaminophen (PERCOCET) 10-325 MG tablet Take 1 tablet by mouth every 8 (eight) hours as needed for pain., Starting Sat 09/29/2016, Until Tue 10/02/2016, Print             Braxton Feathers, PA-C 09/29/16 1740    Rudene Re, MD 10/01/16 1059

## 2016-09-29 NOTE — ED Triage Notes (Signed)
States that she saw MD on Monday who told her she had a problem with the screw in her L hip.States she has an appointment with the ortho MD who did the surgery one year ago this coming Tuesday. States she is here to get pain relief while she awaits seeing orthopedist.

## 2016-09-30 ENCOUNTER — Other Ambulatory Visit: Payer: Self-pay | Admitting: Pain Medicine

## 2016-09-30 DIAGNOSIS — E559 Vitamin D deficiency, unspecified: Secondary | ICD-10-CM

## 2016-09-30 LAB — COMPLIANCE DRUG ANALYSIS, UR

## 2016-09-30 MED ORDER — OVER THE COUNTER MEDICATION
99 refills | Status: DC
Start: 2016-09-30 — End: 2016-10-23

## 2016-09-30 MED ORDER — VITAMIN D (ERGOCALCIFEROL) 1.25 MG (50000 UNIT) PO CAPS: ORAL_CAPSULE | ORAL | 0 refills | Status: DC

## 2016-09-30 NOTE — Progress Notes (Signed)

## 2016-10-03 LAB — DRUG SCREEN 10 W/CONF, SERUM
AMPHETAMINES, IA: NEGATIVE ng/mL
BARBITURATES, IA: NEGATIVE ug/mL
Benzodiazepines, IA: NEGATIVE ng/mL
Cocaine & Metabolite, IA: NEGATIVE ng/mL
METHADONE, IA: NEGATIVE ng/mL
OPIATES, IA: NEGATIVE ng/mL
Oxycodones, IA: NEGATIVE ng/mL
Phencyclidine, IA: NEGATIVE ng/mL
Propoxyphene, IA: NEGATIVE ng/mL
THC(MARIJUANA) METABOLITE, IA: NEGATIVE ng/mL

## 2016-10-03 LAB — OXYCODONES,MS,WB/SP RFX
OXYCODONES CONFIRMATION: NEGATIVE
OXYMORPHONE: NEGATIVE ng/mL
Oxycocone: NEGATIVE ng/mL

## 2016-10-04 ENCOUNTER — Emergency Department
Admission: EM | Admit: 2016-10-04 | Discharge: 2016-10-04 | Disposition: A | Discharge status: Home / Self Care | Payer: Medicare HMO | Attending: Emergency Medicine | Admitting: Emergency Medicine

## 2016-10-04 DIAGNOSIS — I10 Essential (primary) hypertension: Secondary | ICD-10-CM | POA: Diagnosis not present

## 2016-10-04 DIAGNOSIS — Z79899 Other long term (current) drug therapy: Secondary | ICD-10-CM | POA: Diagnosis not present

## 2016-10-04 DIAGNOSIS — M545 Low back pain: Secondary | ICD-10-CM | POA: Diagnosis present

## 2016-10-04 DIAGNOSIS — M5442 Lumbago with sciatica, left side: Secondary | ICD-10-CM | POA: Diagnosis not present

## 2016-10-04 DIAGNOSIS — G8929 Other chronic pain: Secondary | ICD-10-CM | POA: Diagnosis not present

## 2016-10-04 MED ORDER — ONDANSETRON HCL 4 MG PO TABS: 4.0000 mg | ORAL_TABLET | Freq: Three times a day (TID) | ORAL | 0 refills | Status: DC | PRN

## 2016-10-04 MED ORDER — DIAZEPAM 2 MG PO TABS: 2.0000 mg | ORAL_TABLET | Freq: Three times a day (TID) | ORAL | 0 refills | Status: DC | PRN

## 2016-10-04 MED ORDER — HYDROMORPHONE HCL 1 MG/ML IJ SOLN
1.0000 mg | Freq: Once | INTRAMUSCULAR | Status: AC
  Administered 2016-10-04: 1 mg via INTRAMUSCULAR
  Filled 2016-10-04: qty 1

## 2016-10-04 MED ORDER — DIAZEPAM 2 MG PO TABS
2.0000 mg | ORAL_TABLET | Freq: Once | ORAL | Status: AC
  Administered 2016-10-04: 2 mg via ORAL
  Filled 2016-10-04: qty 1

## 2016-10-04 MED ORDER — OXYCODONE HCL 5 MG PO TABS: 5.0000 mg | ORAL_TABLET | Freq: Three times a day (TID) | ORAL | 0 refills | Status: DC | PRN

## 2016-10-04 MED ORDER — ONDANSETRON 4 MG PO TBDP
4.0000 mg | ORAL_TABLET | Freq: Once | ORAL | Status: AC
  Administered 2016-10-04: 4 mg via ORAL
  Filled 2016-10-04: qty 1

## 2016-10-04 NOTE — ED Triage Notes (Signed)
Pt to triage via w/c with no distress noted; pt with hx chronic back pain; taking oxycodone; c/o lower back pain radiating down leg

## 2016-10-04 NOTE — ED Notes (Signed)

## 2016-10-04 NOTE — ED Notes (Signed)
Pt reports hx chronic back pain but worse over the past month, pt reports taking oxycodone at home but states vomits it up.

## 2016-10-04 NOTE — ED Provider Notes (Signed)
[facility] Emergency Department Provider Note ____________________________________________  Time seen: Approximately 10:21 PM  I have reviewed the triage vital signs and the nursing notes.   HISTORY  Chief Complaint Back Pain    HPI Mironda Aiello is a 65 y.o. female who presents to the emergency department for pain management. She has chronic lower back pain and left sciatica for which she sees Dr. Forest Becker and pain management. She was evaluated by Dr. Forest Becker on Tuesday of this last week and a CT scan was ordered. She does not know the results of the CT scan and states that her pain has significantly increased. She attempted to take a Percocet 10/325 tonight, but it made her vomit. She states that pain management has agreed to see her, but she has not had her initial evaluation by psychology and therefore they have not prescribed her any medications.  Past Medical History:  Diagnosis Date  . Anxiety   . Chronic hip pain   . Chronic pain syndrome   . Depression   . Edema   . Fatigue   . Fibromyalgia   . GERD (gastroesophageal reflux disease)   . Headache   . Hypertension   . Insomnia   . Low back pain   . Vitamin D deficiency     Patient Active Problem List   Diagnosis Date Noted  . Hypokalemia 09/27/2016  . Long term current use of opiate analgesic 09/24/2016  . Long term prescription opiate use 09/24/2016  . Opiate use 09/24/2016  . Encounter for therapeutic drug level monitoring 09/24/2016  . Encounter for pain management planning 09/24/2016  . Rheumatoid arthritis, multiple sites (positive RF) 09/24/2016  . Chronic lower extremity pain (Location of Secondary source of pain) (Left) 09/24/2016  . Lumbar facet arthropathy (Bilateral) 09/24/2016  . Failed back surgical syndrome (x 3) 09/24/2016  . Epidural fibrosis 09/24/2016  . Neurogenic pain 09/24/2016  . Musculoskeletal pain 09/24/2016  . Chronic lumbar radicular pain (L5/S1 dermatome)  (Location of Secondary source of pain) (Left) 09/24/2016  . History of total hip replacement, bilateral 09/24/2016  . Chronic pain 09/21/2016  . GERD (gastroesophageal reflux disease) 08/03/2015  . Acute anxiety 08/03/2015  . Rheumatoid factor positive 08/03/2015  . Fatigue 08/03/2015  . Vitamin D deficiency 08/03/2015  . Insomnia 08/03/2015  . Fibromyalgia 08/03/2015  . Chronic low back pain (Location of Primary Source of Pain) (Left) 08/03/2015  . Chronic pain syndrome 08/03/2015  . Depression 08/03/2015  . Hypertension 08/03/2015  . Cellulitis due to MRSA 08/03/2015  . Mechanical complication of internal joint prosthesis ([place]) 04/22/2012    Past Surgical History:  Procedure Laterality Date  . ABDOMINAL HYSTERECTOMY    . BACK SURGERY    . BREAST SURGERY    . cyst removal from wrist    . ECTOPIC PREGNANCY SURGERY    . HIP SURGERY     x4    Prior to Admission medications   Medication Sig Start Date End Date Taking? Authorizing Provider  amLODipine (NORVASC) 5 MG tablet Take 1 tablet (5 mg total) by mouth daily. 05/29/16   Leone Haven, MD  diazepam (VALIUM) 2 MG tablet Take 1 tablet (2 mg total) by mouth every 8 (eight) hours as needed. 10/04/16   Victorino Dike, FNP  escitalopram (LEXAPRO) 20 MG tablet Take 1 tablet (20 mg total) by mouth daily. 05/29/16   Leone Haven, MD  hydrochlorothiazide (HYDRODIURIL) 25 MG tablet Take 1 tablet (25 mg total) by mouth daily. 05/29/16  Leone Haven, MD  ondansetron (ZOFRAN) 4 MG tablet Take 1 tablet (4 mg total) by mouth every 8 (eight) hours as needed for nausea or vomiting. 10/04/16   Victorino Dike, FNP  OVER THE COUNTER MEDICATION Calcium 1200 mg plus Vitamin D3 1000 IU (Nature's Bounty) Take two (2) Softgels daily, preferably with meals 09/30/16   Milinda Pointer, MD  oxyCODONE (ROXICODONE) 5 MG immediate release tablet Take 1 tablet (5 mg total) by mouth every 8 (eight) hours as needed. 10/04/16 10/04/17  Victorino Dike, FNP  potassium chloride SA (K-DUR,KLOR-CON) 20 MEQ tablet Take 2 tablets (40 mEq total) by mouth 2 (two) times daily. 09/26/16   Leone Haven, MD  Vitamin D, Ergocalciferol, (DRISDOL) 50000 units CAPS capsule Take 1 capsule (50,000 Units total) by mouth 2 (two) times a week. X 6 weeks. 09/30/16   Milinda Pointer, MD    Allergies Ace inhibitors; Penicillin g benzathine; and Tape  Family History  Problem Relation Age of Onset  . Heart disease Mother   . Cancer Father   . Alcohol abuse Father     Social History Social History  Substance Use Topics  . Smoking status: Never Smoker  . Smokeless tobacco: Never Used  . Alcohol use No    Review of Systems Constitutional: No recent illness. Cardiovascular: Denies chest pain or palpitations. Respiratory: Denies shortness of breath. Musculoskeletal: Pain in Lower back with radiation into the left lower extremity Skin: Negative for rash, wound, lesion. Neurological: Negative for focal weakness or numbness.  ____________________________________________   PHYSICAL EXAM:  VITAL SIGNS: ED Triage Vitals  Enc Vitals Group     BP 10/04/16 2154 (!) 156/99     Pulse Rate 10/04/16 2154 92     Resp 10/04/16 2154 20     Temp 10/04/16 2154 97.7 F (36.5 C)     Temp Source 10/04/16 2154 Oral     SpO2 10/04/16 2154 99 %     Weight 10/04/16 2154 230 lb (104.3 kg)     Height 10/04/16 2154 6\' 1"  (1.854 m)     Head Circumference --      Peak Flow --      Pain Score 10/04/16 2159 9     Pain Loc --      Pain Edu? --      Excl. in [place]? --     Constitutional: Alert and oriented. Tearful Eyes: Conjunctivae are normal. EOMI. Head: Atraumatic. Neck: No stridor.  Respiratory: Normal respiratory effort.   Musculoskeletal: Active range of motion throughout with positive straight leg raise on the left at about 35 Neurologic:  Normal speech and language. No gross focal neurologic deficits are appreciated. Speech is normal. No gait  instability. No saddle anesthesia or bowel or bladder incontinence. Skin:  Skin is warm, dry and intact. Atraumatic. Psychiatric: Mood and affect are normal. Speech and behavior are normal.  ____________________________________________   LABS (all labs ordered are listed, but only abnormal results are displayed)  Labs Reviewed - No data to display ____________________________________________  RADIOLOGY  Not indicated ____________________________________________   PROCEDURES  Procedure(s) performed: None   ____________________________________________   INITIAL IMPRESSION / ASSESSMENT AND PLAN / ED COURSE  Clinical Course    Pertinent labs & imaging results that were available during my care of the patient were reviewed by me and considered in my medical decision making (see chart for details).  Patient was instructed to call Dr. Forest Becker in the morning to schedule an appointment. She received  significant pain relief with 1 mg of Dilaudid given IM and 2 mg of Valium by mouth. Nausea was relieved with Zofran. She will be given prescriptions of Zofran, Roxicodone immediate release, and Valium 2 mg. She and her husband were made aware that these medications especially in combination may cause dizziness and put her at an increased risk for fall. She will evaluate how she feels while taking these medications and not to stand or walk alone if she is symptomatic. She was advised to return to the emergency department immediately for any loss of bowel or bladder function or control or saddle anesthesia which was explained. ____________________________________________   FINAL CLINICAL IMPRESSION(S) / ED DIAGNOSES  Final diagnoses:  Chronic left-sided low back pain with left-sided sciatica       Victorino Dike, FNP 10/04/16 2353    Eula Listen, MD 10/05/16 0001

## 2016-10-11 ENCOUNTER — Emergency Department
Admission: EM | Admit: 2016-10-11 | Discharge: 2016-10-11 | Disposition: A | Discharge status: Home / Self Care | Payer: Medicare HMO | Source: Home / Self Care | Attending: Emergency Medicine | Admitting: Emergency Medicine

## 2016-10-11 DIAGNOSIS — I10 Essential (primary) hypertension: Secondary | ICD-10-CM

## 2016-10-11 DIAGNOSIS — G8929 Other chronic pain: Secondary | ICD-10-CM

## 2016-10-11 DIAGNOSIS — M5442 Lumbago with sciatica, left side: Secondary | ICD-10-CM | POA: Insufficient documentation

## 2016-10-11 DIAGNOSIS — Z79899 Other long term (current) drug therapy: Secondary | ICD-10-CM

## 2016-10-11 MED ORDER — LIDOCAINE 5 % EX PTCH
1.0000 | MEDICATED_PATCH | CUTANEOUS | Status: DC
  Administered 2016-10-11: 1 via TRANSDERMAL
  Filled 2016-10-11: qty 1

## 2016-10-11 MED ORDER — DEXAMETHASONE SODIUM PHOSPHATE 10 MG/ML IJ SOLN
10.0000 mg | Freq: Once | INTRAMUSCULAR | Status: AC
  Administered 2016-10-11: 10 mg via INTRAMUSCULAR
  Filled 2016-10-11: qty 1

## 2016-10-11 MED ORDER — PREDNISONE 10 MG (21) PO TBPK: 10.0000 mg | ORAL_TABLET | Freq: Every day | ORAL | 0 refills | Status: DC

## 2016-10-11 MED ORDER — LIDOCAINE 5 % EX PTCH: 1.0000 | MEDICATED_PATCH | Freq: Two times a day (BID) | CUTANEOUS | 0 refills | Status: DC

## 2016-10-11 NOTE — ED Provider Notes (Signed)
[facility] Emergency Department Provider Note        Time seen: ----------------------------------------- 6:50 AM on 10/11/2016 -----------------------------------------    I have reviewed the triage vital signs and the nursing notes.   HISTORY  Chief Complaint Back Pain    HPI Maria Hamilton is a 65 y.o. female who presents to the ER being brought by daughter for chronic back pain.Patient states she has been taking oxycodone for the pain but it's not helping. Patient reports a history of 3 back surgeries, recently saw her back surgeon who advised she did not need further surgical procedures. Her chronic pain doctor had sent her to the back surgeon suggesting he may be due to hardware malfunction. The back surgeon disagreed. She's been taking over-the-counter medications without improvement. She still complains of severe radicular left leg pain.   Past Medical History:  Diagnosis Date  . Anxiety   . Chronic hip pain   . Chronic pain syndrome   . Depression   . Edema   . Fatigue   . Fibromyalgia   . GERD (gastroesophageal reflux disease)   . Headache   . Hypertension   . Insomnia   . Low back pain   . Vitamin D deficiency     Patient Active Problem List   Diagnosis Date Noted  . Hypokalemia 09/27/2016  . Long term current use of opiate analgesic 09/24/2016  . Long term prescription opiate use 09/24/2016  . Opiate use 09/24/2016  . Encounter for therapeutic drug level monitoring 09/24/2016  . Encounter for pain management planning 09/24/2016  . Rheumatoid arthritis, multiple sites (positive RF) 09/24/2016  . Chronic lower extremity pain (Location of Secondary source of pain) (Left) 09/24/2016  . Lumbar facet arthropathy (Bilateral) 09/24/2016  . Failed back surgical syndrome (x 3) 09/24/2016  . Epidural fibrosis 09/24/2016  . Neurogenic pain 09/24/2016  . Musculoskeletal pain 09/24/2016  . Chronic lumbar radicular pain (L5/S1 dermatome)  (Location of Secondary source of pain) (Left) 09/24/2016  . History of total hip replacement, bilateral 09/24/2016  . Chronic pain 09/21/2016  . GERD (gastroesophageal reflux disease) 08/03/2015  . Acute anxiety 08/03/2015  . Rheumatoid factor positive 08/03/2015  . Fatigue 08/03/2015  . Vitamin D deficiency 08/03/2015  . Insomnia 08/03/2015  . Fibromyalgia 08/03/2015  . Chronic low back pain (Location of Primary Source of Pain) (Left) 08/03/2015  . Chronic pain syndrome 08/03/2015  . Depression 08/03/2015  . Hypertension 08/03/2015  . Cellulitis due to MRSA 08/03/2015  . Mechanical complication of internal joint prosthesis ([place]) 04/22/2012    Past Surgical History:  Procedure Laterality Date  . ABDOMINAL HYSTERECTOMY    . BACK SURGERY    . BREAST SURGERY    . cyst removal from wrist    . ECTOPIC PREGNANCY SURGERY    . HIP SURGERY     x4    Allergies Ace inhibitors; Penicillin g benzathine; and Tape  Social History Social History  Substance Use Topics  . Smoking status: Never Smoker  . Smokeless tobacco: Never Used  . Alcohol use No    Review of Systems Constitutional: Negative for fever. Cardiovascular: Negative for chest pain. Respiratory: Negative for shortness of breath. Gastrointestinal: Negative for abdominal pain, vomiting and diarrhea. Genitourinary: Negative for dysuria. Musculoskeletal: Positive for left leg pain Skin: Negative for rash. Neurological: Negative for headaches, positive for left foot numbness  10-point ROS otherwise negative.  ____________________________________________   PHYSICAL EXAM:  VITAL SIGNS: ED Triage Vitals  Enc Vitals Group  BP 10/11/16 0623 140/82     Pulse Rate 10/11/16 0623 73     Resp 10/11/16 0623 18     Temp 10/11/16 0623 98.2 F (36.8 C)     Temp Source 10/11/16 0623 Oral     SpO2 10/11/16 0623 99 %     Weight 10/11/16 0616 230 lb (104.3 kg)     Height 10/11/16 0616 6\' 1"  (1.854 m)     Head  Circumference --      Peak Flow --      Pain Score 10/11/16 0616 10     Pain Loc --      Pain Edu? --      Excl. in [place]? --     Constitutional: Alert and oriented.  Eyes: Conjunctivae are normal. PERRL. Normal extraocular movements. ENT   Head: Normocephalic and atraumatic.   Nose: No congestion/rhinnorhea.   Mouth/Throat: Mucous membranes are moist.   Neck: No stridor. Cardiovascular: Normal rate, regular rhythm. No murmurs, rubs, or gallops. Respiratory: Normal respiratory effort without tachypnea nor retractions. Breath sounds are clear and equal bilaterally. No wheezes/rales/rhonchi. Gastrointestinal: Soft and nontender. Normal bowel sounds Musculoskeletal: Pain with range of motion of left leg. Neurologic:  Normal speech and language. No gross focal neurologic deficits are appreciated. Paresthesias noted left foot Skin:  Skin is warm, dry and intact. No rash noted. Psychiatric: Depressed mood and affect, tearful ____________________________________________  ED COURSE:  Pertinent labs & imaging results that were available during my care of the patient were reviewed by me and considered in my medical decision making (see chart for details). Clinical Course  Patient presents to the ER in no distress with chronic pain and likely depression. She can no longer receive narcotics for this pain. I believe she has recently been declined by the pain clinic.  Procedures ____________________________________________  FINAL ASSESSMENT AND PLAN  Chronic pain management  Plan: Patient with chronic pain that seems multifactorial. She likely does have some sciatica but also appears depressed. I will try a longer course of steroids, Lidoderm patch. Otherwise she is stable for outpatient follow-up with her doctor.   Earleen Newport, MD   Note: This dictation was prepared with Dragon dictation. Any transcriptional errors that result from this process are unintentional     Earleen Newport, MD 10/11/16 [phone]

## 2016-10-11 NOTE — ED Triage Notes (Signed)
Pt to triage via w/c accomp by daughter; pt has been seen multiple times for chronic back pain and out of oxycodone

## 2016-10-11 NOTE — ED Notes (Signed)
Pt states back pain running down L leg to foot. "It's killing me, I can't do it." Oxycodone taken at home. States "it's been a while" when asked how long she's been in pain. Back surgery a year ago? States "I don't know when it was, I can't tell ya."

## 2016-10-12 DIAGNOSIS — R4182 Altered mental status, unspecified: Secondary | ICD-10-CM | POA: Insufficient documentation

## 2016-10-12 HISTORY — DX: Altered mental status, unspecified: R41.82

## 2016-10-14 ENCOUNTER — Encounter: Payer: Self-pay | Admitting: *Deleted

## 2016-10-14 ENCOUNTER — Inpatient Hospital Stay
Admission: EM | Admit: 2016-10-14 | Discharge: 2016-10-19 | DRG: 418 | Disposition: A | Discharge status: Home / Self Care | Payer: Medicare HMO | Attending: Surgery | Admitting: Surgery

## 2016-10-14 ENCOUNTER — Emergency Department: Payer: Medicare HMO

## 2016-10-14 DIAGNOSIS — Z888 Allergy status to other drugs, medicaments and biological substances status: Secondary | ICD-10-CM | POA: Diagnosis not present

## 2016-10-14 DIAGNOSIS — K219 Gastro-esophageal reflux disease without esophagitis: Secondary | ICD-10-CM | POA: Diagnosis present

## 2016-10-14 DIAGNOSIS — I10 Essential (primary) hypertension: Secondary | ICD-10-CM | POA: Diagnosis present

## 2016-10-14 DIAGNOSIS — K851 Biliary acute pancreatitis without necrosis or infection: Principal | ICD-10-CM | POA: Diagnosis present

## 2016-10-14 DIAGNOSIS — Z880 Allergy status to penicillin: Secondary | ICD-10-CM

## 2016-10-14 DIAGNOSIS — K8020 Calculus of gallbladder without cholecystitis without obstruction: Secondary | ICD-10-CM

## 2016-10-14 DIAGNOSIS — F329 Major depressive disorder, single episode, unspecified: Secondary | ICD-10-CM | POA: Diagnosis present

## 2016-10-14 DIAGNOSIS — Z8249 Family history of ischemic heart disease and other diseases of the circulatory system: Secondary | ICD-10-CM | POA: Diagnosis not present

## 2016-10-14 DIAGNOSIS — Z23 Encounter for immunization: Secondary | ICD-10-CM

## 2016-10-14 DIAGNOSIS — Z79899 Other long term (current) drug therapy: Secondary | ICD-10-CM | POA: Diagnosis not present

## 2016-10-14 DIAGNOSIS — Z96643 Presence of artificial hip joint, bilateral: Secondary | ICD-10-CM | POA: Diagnosis present

## 2016-10-14 DIAGNOSIS — E559 Vitamin D deficiency, unspecified: Secondary | ICD-10-CM | POA: Diagnosis present

## 2016-10-14 DIAGNOSIS — K819 Cholecystitis, unspecified: Secondary | ICD-10-CM

## 2016-10-14 DIAGNOSIS — K859 Acute pancreatitis without necrosis or infection, unspecified: Secondary | ICD-10-CM | POA: Diagnosis present

## 2016-10-14 DIAGNOSIS — M797 Fibromyalgia: Secondary | ICD-10-CM | POA: Diagnosis present

## 2016-10-14 DIAGNOSIS — F419 Anxiety disorder, unspecified: Secondary | ICD-10-CM | POA: Diagnosis present

## 2016-10-14 DIAGNOSIS — K810 Acute cholecystitis: Secondary | ICD-10-CM | POA: Diagnosis present

## 2016-10-14 HISTORY — DX: Acute pancreatitis without necrosis or infection, unspecified: K85.90

## 2016-10-14 LAB — CBC
HCT: 44 % (ref 35.0–47.0)
Hemoglobin: 14.4 g/dL (ref 12.0–16.0)
MCH: 27.4 pg (ref 26.0–34.0)
MCHC: 32.6 g/dL (ref 32.0–36.0)
MCV: 83.8 fL (ref 80.0–100.0)
PLATELETS: 332 10*3/uL (ref 150–440)
RBC: 5.25 MIL/uL — ABNORMAL HIGH (ref 3.80–5.20)
RDW: 14.3 % (ref 11.5–14.5)
WBC: 5 10*3/uL (ref 3.6–11.0)

## 2016-10-14 LAB — COMPREHENSIVE METABOLIC PANEL
ALK PHOS: 179 U/L — AB (ref 38–126)
ALT: 759 U/L — AB (ref 14–54)
AST: 1161 U/L — AB (ref 15–41)
Albumin: 4 g/dL (ref 3.5–5.0)
Anion gap: 11 (ref 5–15)
BUN: 12 mg/dL (ref 6–20)
CALCIUM: 9.7 mg/dL (ref 8.9–10.3)
CHLORIDE: 100 mmol/L — AB (ref 101–111)
CO2: 24 mmol/L (ref 22–32)
CREATININE: 0.8 mg/dL (ref 0.44–1.00)
Glucose, Bld: 136 mg/dL — ABNORMAL HIGH (ref 65–99)
Potassium: 3.3 mmol/L — ABNORMAL LOW (ref 3.5–5.1)
Sodium: 135 mmol/L (ref 135–145)
Total Bilirubin: 1.5 mg/dL — ABNORMAL HIGH (ref 0.3–1.2)
Total Protein: 8.4 g/dL — ABNORMAL HIGH (ref 6.5–8.1)

## 2016-10-14 LAB — LIPASE, BLOOD: LIPASE: 876 U/L — AB (ref 11–51)

## 2016-10-14 MED ORDER — INFLUENZA VAC SPLIT QUAD 0.5 ML IM SUSY
0.5000 mL | PREFILLED_SYRINGE | INTRAMUSCULAR | Status: AC
  Administered 2016-10-17: 0.5 mL via INTRAMUSCULAR
  Filled 2016-10-14: qty 0.5

## 2016-10-14 MED ORDER — HYDROCHLOROTHIAZIDE 25 MG PO TABS
25.0000 mg | ORAL_TABLET | Freq: Every day | ORAL | Status: DC
  Administered 2016-10-14 – 2016-10-19 (×5): 25 mg via ORAL
  Filled 2016-10-14 (×5): qty 1

## 2016-10-14 MED ORDER — ONDANSETRON HCL 4 MG/2ML IJ SOLN
4.0000 mg | Freq: Once | INTRAMUSCULAR | Status: AC
  Administered 2016-10-14: 4 mg via INTRAVENOUS
  Filled 2016-10-14: qty 2

## 2016-10-14 MED ORDER — AMLODIPINE BESYLATE 5 MG PO TABS
5.0000 mg | ORAL_TABLET | Freq: Every day | ORAL | Status: DC
  Administered 2016-10-14 – 2016-10-19 (×5): 5 mg via ORAL
  Filled 2016-10-14 (×5): qty 1

## 2016-10-14 MED ORDER — HYDROMORPHONE HCL 1 MG/ML IJ SOLN
0.5000 mg | INTRAMUSCULAR | Status: DC | PRN
  Administered 2016-10-15 – 2016-10-16 (×4): 0.5 mg via INTRAVENOUS
  Filled 2016-10-14 (×5): qty 1

## 2016-10-14 MED ORDER — CIPROFLOXACIN IN D5W 400 MG/200ML IV SOLN
400.0000 mg | Freq: Two times a day (BID) | INTRAVENOUS | Status: DC
  Administered 2016-10-15: 400 mg via INTRAVENOUS
  Filled 2016-10-14 (×2): qty 200

## 2016-10-14 MED ORDER — PNEUMOCOCCAL VAC POLYVALENT 25 MCG/0.5ML IJ INJ
0.5000 mL | INJECTION | INTRAMUSCULAR | Status: AC
  Administered 2016-10-17: 0.5 mL via INTRAMUSCULAR
  Filled 2016-10-14: qty 0.5

## 2016-10-14 MED ORDER — HEPARIN SODIUM (PORCINE) 5000 UNIT/ML IJ SOLN
5000.0000 [IU] | Freq: Three times a day (TID) | INTRAMUSCULAR | Status: DC
  Administered 2016-10-14 – 2016-10-19 (×13): 5000 [IU] via SUBCUTANEOUS
  Filled 2016-10-14 (×13): qty 1

## 2016-10-14 MED ORDER — KCL IN DEXTROSE-NACL 30-5-0.45 MEQ/L-%-% IV SOLN
INTRAVENOUS | Status: DC
  Administered 2016-10-14 – 2016-10-16 (×4): via INTRAVENOUS
  Filled 2016-10-14 (×7): qty 1000

## 2016-10-14 MED ORDER — SODIUM CHLORIDE 0.9 % IV BOLUS (SEPSIS)
1000.0000 mL | Freq: Once | INTRAVENOUS | Status: AC
  Administered 2016-10-14: 1000 mL via INTRAVENOUS

## 2016-10-14 MED ORDER — ESCITALOPRAM OXALATE 10 MG PO TABS
20.0000 mg | ORAL_TABLET | Freq: Every day | ORAL | Status: DC
  Administered 2016-10-14 – 2016-10-19 (×5): 20 mg via ORAL
  Filled 2016-10-14 (×5): qty 2

## 2016-10-14 MED ORDER — HYDROMORPHONE HCL 1 MG/ML IJ SOLN
1.0000 mg | Freq: Once | INTRAMUSCULAR | Status: AC
Start: 2016-10-14 — End: 2016-10-14
  Administered 2016-10-14: 1 mg via INTRAVENOUS
  Filled 2016-10-14: qty 1

## 2016-10-14 MED ORDER — PREDNISONE 10 MG (21) PO TBPK: 10.0000 mg | ORAL_TABLET | Freq: Every day | ORAL | Status: DC

## 2016-10-14 NOTE — ED Notes (Signed)
Pt to treatment room via wheelchair. Pt presents with lower abdominal pain. Dr. Kerman Passey at bedside.

## 2016-10-14 NOTE — ED Provider Notes (Signed)
[facility] Emergency Department Provider Note  Time seen: 7:13 PM  I have reviewed the triage vital signs and the nursing notes.   HISTORY  Chief Complaint Abdominal Pain    HPI Maria Hamilton is a 65 y.o. female with a past medical history of chronic pain, fibromyalgia, gastric reflux, hypertension, who presents to the emergency department with right upper quadrant abdominal pain beginning last night. According to the patient since last night she has been expressing progressively worsening right upper quadrant epigastric pain. Describes the pain as a 10/10 currently, dull/aching pain. States nausea but denies vomiting. Denies diarrhea. Denies fever.  Past Medical History:  Diagnosis Date  . Anxiety   . Chronic hip pain   . Chronic pain syndrome   . Depression   . Edema   . Fatigue   . Fibromyalgia   . GERD (gastroesophageal reflux disease)   . Headache   . Hypertension   . Insomnia   . Low back pain   . Vitamin D deficiency     Patient Active Problem List   Diagnosis Date Noted  . Hypokalemia 09/27/2016  . Long term current use of opiate analgesic 09/24/2016  . Long term prescription opiate use 09/24/2016  . Opiate use 09/24/2016  . Encounter for therapeutic drug level monitoring 09/24/2016  . Encounter for pain management planning 09/24/2016  . Rheumatoid arthritis, multiple sites (positive RF) 09/24/2016  . Chronic lower extremity pain (Location of Secondary source of pain) (Left) 09/24/2016  . Lumbar facet arthropathy (Bilateral) 09/24/2016  . Failed back surgical syndrome (x 3) 09/24/2016  . Epidural fibrosis 09/24/2016  . Neurogenic pain 09/24/2016  . Musculoskeletal pain 09/24/2016  . Chronic lumbar radicular pain (L5/S1 dermatome) (Location of Secondary source of pain) (Left) 09/24/2016  . History of total hip replacement, bilateral 09/24/2016  . Chronic pain 09/21/2016  . GERD (gastroesophageal reflux disease) 08/03/2015  . Acute  anxiety 08/03/2015  . Rheumatoid factor positive 08/03/2015  . Fatigue 08/03/2015  . Vitamin D deficiency 08/03/2015  . Insomnia 08/03/2015  . Fibromyalgia 08/03/2015  . Chronic low back pain (Location of Primary Source of Pain) (Left) 08/03/2015  . Chronic pain syndrome 08/03/2015  . Depression 08/03/2015  . Hypertension 08/03/2015  . Cellulitis due to MRSA 08/03/2015  . Mechanical complication of internal joint prosthesis ([place]) 04/22/2012    Past Surgical History:  Procedure Laterality Date  . ABDOMINAL HYSTERECTOMY    . BACK SURGERY    . BREAST SURGERY    . cyst removal from wrist    . ECTOPIC PREGNANCY SURGERY    . HIP SURGERY     x4    Prior to Admission medications   Medication Sig Start Date End Date Taking? Authorizing Provider  amLODipine (NORVASC) 5 MG tablet Take 1 tablet (5 mg total) by mouth daily. 05/29/16   Leone Haven, MD  diazepam (VALIUM) 2 MG tablet Take 1 tablet (2 mg total) by mouth every 8 (eight) hours as needed. 10/04/16   Victorino Dike, FNP  escitalopram (LEXAPRO) 20 MG tablet Take 1 tablet (20 mg total) by mouth daily. 05/29/16   Leone Haven, MD  hydrochlorothiazide (HYDRODIURIL) 25 MG tablet Take 1 tablet (25 mg total) by mouth daily. 05/29/16   Leone Haven, MD  lidocaine (LIDODERM) 5 % Place 1 patch onto the skin every 12 (twelve) hours. Remove & Discard patch within 12 hours or as directed by MD 10/11/16 10/11/17  Earleen Newport, MD  ondansetron [facility]) 4  MG tablet Take 1 tablet (4 mg total) by mouth every 8 (eight) hours as needed for nausea or vomiting. 10/04/16   Victorino Dike, FNP  OVER THE COUNTER MEDICATION Calcium 1200 mg plus Vitamin D3 1000 IU (Nature's Bounty) Take two (2) Softgels daily, preferably with meals 09/30/16   Milinda Pointer, MD  oxyCODONE (ROXICODONE) 5 MG immediate release tablet Take 1 tablet (5 mg total) by mouth every 8 (eight) hours as needed. 10/04/16 10/04/17  Victorino Dike, FNP  potassium  chloride SA (K-DUR,KLOR-CON) 20 MEQ tablet Take 2 tablets (40 mEq total) by mouth 2 (two) times daily. 09/26/16   Leone Haven, MD  predniSONE (STERAPRED UNI-PAK 21 TAB) 10 MG (21) TBPK tablet Take 1 tablet (10 mg total) by mouth daily. Begin 60 mg for 2 days, then 50 mg for 2 days, then 40 mg for 2 days, then 30 mg for 2 days, then 20 mg daily for 2 days, then 10 mg daily for 2 days 10/11/16   Earleen Newport, MD  Vitamin D, Ergocalciferol, (DRISDOL) 50000 units CAPS capsule Take 1 capsule (50,000 Units total) by mouth 2 (two) times a week. X 6 weeks. 09/30/16   Milinda Pointer, MD    Allergies  Allergen Reactions  . Ace Inhibitors Swelling    Angioedema Angioedema  . Penicillin G Benzathine   . Tape Rash    Family History  Problem Relation Age of Onset  . Heart disease Mother   . Cancer Father   . Alcohol abuse Father     Social History Social History  Substance Use Topics  . Smoking status: Never Smoker  . Smokeless tobacco: Never Used  . Alcohol use No    Review of Systems Constitutional: Negative for fever. Cardiovascular: Negative for chest pain. Respiratory: Negative for shortness of breath. Gastrointestinal: Right upper quadrant/epigastric pain. Positive for nausea. Negative for vomiting or diarrhea. Genitourinary: Negative for dysuria. Neurological: Negative for headache 10-point ROS otherwise negative.  ____________________________________________   PHYSICAL EXAM:  VITAL SIGNS: ED Triage Vitals  Enc Vitals Group     BP 10/14/16 1755 (!) 173/96     Pulse Rate 10/14/16 1755 79     Resp 10/14/16 1755 18     Temp 10/14/16 1755 97.9 F (36.6 C)     Temp Source 10/14/16 1755 Oral     SpO2 10/14/16 1755 99 %     Weight 10/14/16 1756 230 lb (104.3 kg)     Height 10/14/16 1756 6\' 1"  (1.854 m)     Head Circumference --      Peak Flow --      Pain Score 10/14/16 1800 10     Pain Loc --      Pain Edu? --      Excl. in [place]? --     Constitutional:  Alert and oriented. Well appearing and in no distress. Eyes: Normal exam ENT   Head: Normocephalic and atraumatic.   Mouth/Throat: Mucous membranes are moist. Cardiovascular: Normal rate, regular rhythm. No murmur Respiratory: Normal respiratory effort without tachypnea nor retractions. Breath sounds are clear  Gastrointestinal: Soft and nontender. No distention. Musculoskeletal: Nontender with normal range of motion in all extremities.  Neurologic:  Normal speech and language. No gross focal neurologic deficits Skin:  Skin is warm, dry and intact.  Psychiatric: Mood and affect are normal.  ____________________________________________   RADIOLOGY  Ultrasound shows sludge in the gallbladder with gallbladder wall thickening. Positive Murphy sign, possible acalculous cholecystitis.   ____________________________________________  INITIAL IMPRESSION / ASSESSMENT AND PLAN / ED COURSE  Pertinent labs & imaging results that were available during my care of the patient were reviewed by me and considered in my medical decision making (see chart for details).  Patient presents the emergency department with upper abdominal and right upper quadrant pain that began last night and has worsened throughout the day today now a 10/10. Patient's labs are consistent with a obstructive pathology such as choledocholithiasis. Patient has her gallbladder intact. We'll obtain an ultrasound to further evaluate. Patient denies fever at home. Afebrile in the emergency department. We will treat pain and nausea while awaiting or some results. We'll discuss the patient with GI medicine once ultrasound has resulted, anticipate the need for ERCP.  Significantly elevated LFTs as well as lipase. Patient's pain is much better after IV medications. I discussed the patient with Dr. Burt Knack of Gen. surgery who will be down to see the patient. ____________________________________________   FINAL CLINICAL  IMPRESSION(S) / ED DIAGNOSES  Upper abdominal pain cholecystitis   Harvest Dark, MD 10/14/16 2035

## 2016-10-14 NOTE — H&P (Signed)
Maria Hamilton is an 65 y.o. female.    Chief Complaint: Right upper quadrant pain  HPI: This patient is somewhat difficult historian but her family members present and axes an adjunct to the information. She describes the acute onset of abdominal pain starting yesterday with nausea and vomiting no fevers or chills has not had a bowel movement in several days but denies melena or hematochezia has not a fevers or chills. She denies jaundice or acholic stools.  A workup in the emergency room suggest a biliary pancreatitis. She has never had an episode like this before.  Apparently the patient has fibromyalgia and was on a number of medications including Lyrica and chronic opiates but does of been stopped by her primary care physician and she has been referred to a pain clinic and has not yet had that appointment.  She does not smoke or drink and is disabled at this point.  Family history noncontributory  Past Medical History:  Diagnosis Date  . Anxiety   . Chronic hip pain   . Chronic pain syndrome   . Depression   . Edema   . Fatigue   . Fibromyalgia   . GERD (gastroesophageal reflux disease)   . Headache   . Hypertension   . Insomnia   . Low back pain   . Vitamin D deficiency     Past Surgical History:  Procedure Laterality Date  . ABDOMINAL HYSTERECTOMY    . BACK SURGERY    . BREAST SURGERY    . cyst removal from wrist    . ECTOPIC PREGNANCY SURGERY    . HIP SURGERY     x4    Family History  Problem Relation Age of Onset  . Heart disease Mother   . Cancer Father   . Alcohol abuse Father    Social History:  reports that she has never smoked. She has never used smokeless tobacco. She reports that she does not drink alcohol or use drugs.  Allergies:  Allergies  Allergen Reactions  . Ace Inhibitors Swelling    Angioedema Angioedema  . Penicillin G Benzathine   . Tape Rash     (Not in a hospital admission)   Review of Systems  Constitutional: Positive  for malaise/fatigue. Negative for chills, fever and weight loss.  HENT: Negative.   Eyes: Negative.   Respiratory: Negative.   Cardiovascular: Negative.   Gastrointestinal: Positive for abdominal pain, constipation, nausea and vomiting. Negative for blood in stool, diarrhea, heartburn and melena.  Genitourinary: Negative.   Musculoskeletal: Negative.   Skin: Negative.   Neurological: Negative.   Endo/Heme/Allergies: Negative.   Psychiatric/Behavioral: Negative.      Physical Exam:  BP (!) 163/92 (BP Location: Left Arm)   Pulse 72   Temp 97.9 F (36.6 C) (Oral)   Resp 18   Ht _0  (1.854 m)   Wt 230 lb (104.3 kg)   SpO2 99%   BMI 30.34 kg/m   Physical Exam  Constitutional: She is oriented to person, place, and time and well-developed, well-nourished, and in no distress. No distress.  Appear somnolent but wakes easily and discuss his care.  HENT:  Head: Normocephalic and atraumatic.  Eyes: Pupils are equal, round, and reactive to light. Right eye exhibits no discharge. Left eye exhibits no discharge. No scleral icterus.  Neck: Normal range of motion. No JVD present.  Cardiovascular: Normal rate, regular rhythm and normal heart sounds.   Pulmonary/Chest: Effort normal and breath sounds normal. No respiratory distress. She  has no wheezes. She has no rales.  Abdominal: Soft. She exhibits no distension. There is tenderness. There is no rebound and no guarding.  Maximal tenderness in right upper quadrant and epigastrium with a Murphy sign  Musculoskeletal: Normal range of motion. She exhibits no edema or tenderness.  Lymphadenopathy:    She has no cervical adenopathy.  Neurological: She is alert and oriented to person, place, and time.  Skin: Skin is warm and dry. No rash noted. She is not diaphoretic. No erythema.  Psychiatric: Mood and affect normal.  Vitals reviewed.       Results for orders placed or performed during the hospital encounter of 10/14/16 (from the past  48 hour(s))  Lipase, blood     Status: Abnormal   Collection Time: 10/14/16  6:02 PM  Result Value Ref Range   Lipase 876 (H) 11 - 51 U/L    Comment: RESULT CONFIRMED BY MANUAL DILUTION.PMH  Comprehensive metabolic panel     Status: Abnormal   Collection Time: 10/14/16  6:02 PM  Result Value Ref Range   Sodium 135 135 - 145 mmol/L   Potassium 3.3 (L) 3.5 - 5.1 mmol/L   Chloride 100 (L) 101 - 111 mmol/L   CO2 24 22 - 32 mmol/L   Glucose, Bld 136 (H) 65 - 99 mg/dL   BUN 12 6 - 20 mg/dL   Creatinine, Ser 0.80 0.44 - 1.00 mg/dL   Calcium 9.7 8.9 - 10.3 mg/dL   Total Protein 8.4 (H) 6.5 - 8.1 g/dL   Albumin 4.0 3.5 - 5.0 g/dL   AST 1,161 (H) 15 - 41 U/L   ALT 759 (H) 14 - 54 U/L   Alkaline Phosphatase 179 (H) 38 - 126 U/L   Total Bilirubin 1.5 (H) 0.3 - 1.2 mg/dL   GFR calc non Af Amer >60 >60 mL/min   GFR calc Af Amer >60 >60 mL/min    Comment: (NOTE) The eGFR has been calculated using the CKD EPI equation. This calculation has not been validated in all clinical situations. eGFR's persistently <60 mL/min signify possible Chronic Kidney Disease.    Anion gap 11 5 - 15  CBC     Status: Abnormal   Collection Time: 10/14/16  6:02 PM  Result Value Ref Range   WBC 5.0 3.6 - 11.0 K/uL   RBC 5.25 (H) 3.80 - 5.20 MIL/uL   Hemoglobin 14.4 12.0 - 16.0 g/dL   HCT 44.0 35.0 - 47.0 %   MCV 83.8 80.0 - 100.0 fL   MCH 27.4 26.0 - 34.0 pg   MCHC 32.6 32.0 - 36.0 g/dL   RDW 14.3 11.5 - 14.5 %   Platelets 332 150 - 440 K/uL   US Abdomen Limited Ruq  Result Date: 10/14/2016 CLINICAL DATA:  Elevated liver function tests and elevated lipase. EXAM: US ABDOMEN LIMITED - RIGHT UPPER QUADRANT COMPARISON:  CT scan of the abdomen and pelvis dated 03/03/2010 FINDINGS: Gallbladder: There is sludge in the gallbladder with slight thickening of the gallbladder wall. No definable stones. Positive sonographic Murphy's sign. The gallbladder is slightly distended. Common bile duct: Diameter: 6.9 mm, normal  for the patient's age. Liver: Diffuse increased echogenicity of the liver parenchyma without a focal lesion consistent with hepatic steatosis. Incidental note is made of a simple 4 cm cyst in the mid right kidney. IMPRESSION: 1. Sludge in the gallbladder with thickening of the gallbladder wall. This could represent a calculus cholecystitis. Positive sonographic Murphy sign. 2. Ectatic steatosis. Electronically Signed  By: Lorriane Shire M.D.   On: 10/14/2016 20:15     Assessment/Plan  This patient with right upper quadrant pain acute onset her liver function tests are quite elevated more so than one would expect for acute choledocholithiasis and she only has sludge and thickened gallbladder wall. I do believe that this is biliary pancreatitis in nature her lipase is quite elevated. My plan would be to admit the patient to the hospital hydrated control nausea vomiting and pain and repeat labs in the morning. She can have clear liquids tonight and I doubt that if this is biliary pancreatitis that her enzyme levels would be significantly low to have surgery tomorrow anyway this was all discussed with she and her family member and with the emergency room physician her studies are been personally reviewed and over 70 minutes were spent directly in patient care on this workup. The patient family were understanding and in agreement with this plan.  Florene Glen, MD, FACS

## 2016-10-14 NOTE — ED Triage Notes (Signed)
Pt to triage via wheelchair. Pt reports abd pain.  No n/v/d.  Pt released from [place] yesterday.    No bm in 4 days.  Pt alert.

## 2016-10-14 NOTE — ED Notes (Signed)
Pt began dry heaving and then threw up twice. Brown in color. Has some food particles in it. Given new emesis bag, Dr. Kerman Passey made aware.

## 2016-10-14 NOTE — ED Notes (Signed)
Pt unable to void at this time, was placed on bedpan by Iris RN but was not able to urinate. Pt states pain has decreased, pt appears more relaxed. No more episodes of vomiting.

## 2016-10-14 NOTE — ED Notes (Signed)
Iv started.  Labs sent.  Pt in [place].  Pt unable to void at this time.

## 2016-10-14 NOTE — ED Notes (Signed)
Pt transported to US via stretcher.  

## 2016-10-15 LAB — COMPREHENSIVE METABOLIC PANEL
ALBUMIN: 3.3 g/dL — AB (ref 3.5–5.0)
ALK PHOS: 176 U/L — AB (ref 38–126)
ALT: 687 U/L — AB (ref 14–54)
AST: 675 U/L — AB (ref 15–41)
Anion gap: 6 (ref 5–15)
BILIRUBIN TOTAL: 1.6 mg/dL — AB (ref 0.3–1.2)
BUN: 10 mg/dL (ref 6–20)
CALCIUM: 8.7 mg/dL — AB (ref 8.9–10.3)
CO2: 25 mmol/L (ref 22–32)
Chloride: 104 mmol/L (ref 101–111)
Creatinine, Ser: 0.75 mg/dL (ref 0.44–1.00)
GFR calc Af Amer: >60 mL/min (ref >60)
GFR calc non Af Amer: >60 mL/min (ref >60)
GLUCOSE: 107 mg/dL — AB (ref 65–99)
Potassium: 3.6 mmol/L (ref 3.5–5.1)
SODIUM: 135 mmol/L (ref 135–145)
TOTAL PROTEIN: 7 g/dL (ref 6.5–8.1)

## 2016-10-15 LAB — CBC
HCT: 37.5 % (ref 35.0–47.0)
HEMOGLOBIN: 12.6 g/dL (ref 12.0–16.0)
MCH: 28 pg (ref 26.0–34.0)
MCHC: 33.6 g/dL (ref 32.0–36.0)
MCV: 83.3 fL (ref 80.0–100.0)
Platelets: 284 10*3/uL (ref 150–440)
RBC: 4.5 MIL/uL (ref 3.80–5.20)
RDW: 14.4 % (ref 11.5–14.5)
WBC: 5.9 10*3/uL (ref 3.6–11.0)

## 2016-10-15 LAB — URINALYSIS COMPLETE WITH MICROSCOPIC (ARMC ONLY)
BILIRUBIN URINE: NEGATIVE
Bacteria, UA: NONE SEEN
Glucose, UA: NEGATIVE mg/dL
HGB URINE DIPSTICK: NEGATIVE
KETONES UR: NEGATIVE mg/dL
LEUKOCYTES UA: NEGATIVE
NITRITE: NEGATIVE
PH: 7 (ref 5.0–8.0)
PROTEIN: 30 mg/dL — AB
SPECIFIC GRAVITY, URINE: 1.018 (ref 1.005–1.030)

## 2016-10-15 LAB — LIPASE, BLOOD: LIPASE: 166 U/L — AB (ref 11–51)

## 2016-10-15 LAB — MRSA PCR SCREENING: MRSA BY PCR: NEGATIVE

## 2016-10-15 MED ORDER — CLINDAMYCIN PHOSPHATE 600 MG/50ML IV SOLN
600.0000 mg | INTRAVENOUS | Status: AC
  Administered 2016-10-16: 600 mg via INTRAVENOUS
  Filled 2016-10-15: qty 50

## 2016-10-15 MED ORDER — DIPHENHYDRAMINE HCL 50 MG/ML IJ SOLN
50.0000 mg | Freq: Four times a day (QID) | INTRAMUSCULAR | Status: DC | PRN
  Administered 2016-10-15 – 2016-10-17 (×2): 50 mg via INTRAVENOUS
  Filled 2016-10-15: qty 1

## 2016-10-15 MED ORDER — DIPHENHYDRAMINE HCL 50 MG/ML IJ SOLN
INTRAMUSCULAR | Status: AC
  Administered 2016-10-15: 50 mg via INTRAVENOUS
  Filled 2016-10-15: qty 1

## 2016-10-15 MED ORDER — ACETAMINOPHEN 325 MG PO TABS
650.0000 mg | ORAL_TABLET | ORAL | Status: DC | PRN
  Administered 2016-10-15 – 2016-10-19 (×2): 650 mg via ORAL
  Filled 2016-10-15 (×2): qty 2

## 2016-10-15 NOTE — Progress Notes (Signed)
Subjective:   She is having less pain this morning. She denies any nausea. She's not had any fever. Her lipase is down and her transaminases have improved. Her bilirubin remains low. She is more responsive than was reported to me and sign out.  Vital signs in last 24 hours: Temp:  [97.9 F (36.6 C)-99 F (37.2 C)] 99 F (37.2 C) (10/30 0427) Pulse Rate:  [66-79] 69 (10/30 0427) Resp:  [11-22] 16 (10/30 0427) BP: (119-173)/(72-96) 137/73 (10/30 0427) SpO2:  [90 %-100 %] 99 % (10/30 0427) Weight:  [104.3 kg (230 lb)] 104.3 kg (230 lb) (10/29 1756) Last BM Date: 10/13/16  Intake/Output from previous day: 10/29 0701 - 10/30 0700 In: 510.4 [I.V.:510.4] Out: 1400 [Urine:1400]  Exam:  Her abdomen is moderately tender in right upper quadrant with active bowel sounds no rebound and no guarding. Her lungs are clear no adventitious sounds and she has normal respiratory excursion. Cardiac exam reveals no murmurs or gallops to my air.  Lab Results:  CBC  Recent Labs  10/14/16 1802 10/15/16 0456  WBC 5.0 5.9  HGB 14.4 12.6  HCT 44.0 37.5  PLT 332 284   CMP     Component Value Date/Time   NA 135 10/15/2016 0456   NA 137 03/02/2014 1131   K 3.6 10/15/2016 0456   K 3.2 (L) 03/02/2014 1131   CL 104 10/15/2016 0456   CL 104 03/02/2014 1131   CO2 25 10/15/2016 0456   CO2 30 03/02/2014 1131   GLUCOSE 107 (H) 10/15/2016 0456   GLUCOSE 88 03/02/2014 1131   BUN 10 10/15/2016 0456   BUN 5 (L) 03/02/2014 1131   CREATININE 0.75 10/15/2016 0456   CREATININE 0.83 03/02/2014 1131   CALCIUM 8.7 (L) 10/15/2016 0456   CALCIUM 8.5 03/02/2014 1131   PROT 7.0 10/15/2016 0456   ALBUMIN 3.3 (L) 10/15/2016 0456   AST 675 (H) 10/15/2016 0456   ALT 687 (H) 10/15/2016 0456   ALKPHOS 176 (H) 10/15/2016 0456   BILITOT 1.6 (H) 10/15/2016 0456   GFRNONAA >60 10/15/2016 0456   GFRNONAA >60 03/02/2014 1131   GFRAA >60 10/15/2016 0456   GFRAA >60 03/02/2014 1131   PT/INR No results for input(s):  LABPROT, INR in the last 72 hours.  Studies/Results: US Abdomen Limited Ruq  Result Date: 10/14/2016 CLINICAL DATA:  Elevated liver function tests and elevated lipase. EXAM: US ABDOMEN LIMITED - RIGHT UPPER QUADRANT COMPARISON:  CT scan of the abdomen and pelvis dated 03/03/2010 FINDINGS: Gallbladder: There is sludge in the gallbladder with slight thickening of the gallbladder wall. No definable stones. Positive sonographic Murphy's sign. The gallbladder is slightly distended. Common bile duct: Diameter: 6.9 mm, normal for the patient's age. Liver: Diffuse increased echogenicity of the liver parenchyma without a focal lesion consistent with hepatic steatosis. Incidental note is made of a simple 4 cm cyst in the mid right kidney. IMPRESSION: 1. Sludge in the gallbladder with thickening of the gallbladder wall. This could represent a calculus cholecystitis. Positive sonographic Murphy sign. 2. Ectatic steatosis. Electronically Signed   By: Lorriane Shire M.D.   On: 10/14/2016 20:15    Assessment/Plan: We are working under the assessment that this set of symptoms represents biliary pancreatitis. She does appear to have evidence of sludge in her gallbladder with some gallbladder wall thickening and a positive Murphy sign. We will continue to follow her blood work. We would anticipate offering surgical intervention. Her family will be present later today and we will discuss that  option tentatively considering surgery for tomorrow. She is in agreement.

## 2016-10-15 NOTE — Progress Notes (Signed)
Initial Nutrition Assessment  DOCUMENTATION CODES:   Not applicable  INTERVENTION:  -Diet progression as tolerated  NUTRITION DIAGNOSIS:   Inadequate oral intake related to acute illness as evidenced by per patient/family report, NPO status.  GOAL:   Patient will meet greater than or equal to 90% of their needs  MONITOR:   Diet advancement, PO intake, Labs, Weight trends  REASON FOR ASSESSMENT:   Malnutrition Screening Tool    ASSESSMENT:    65 yo female admitted with biliary pancreatitis  Pt reports not wanting to eat anything at present due to severe pain and nausea with po intake. Pt reports she ate liquid diet last night and was miserable. Pt does reports she is trying to take small sips of liquids throughout the day. Weight stable per wt encounters.   Past Medical History:  Diagnosis Date  . Anxiety   . Chronic hip pain   . Chronic pain syndrome   . Depression   . Edema   . Fatigue   . Fibromyalgia   . GERD (gastroesophageal reflux disease)   . Headache   . Hypertension   . Insomnia   . Low back pain   . Vitamin D deficiency      Diet Order:  Diet clear liquid Room service appropriate? Yes; Fluid consistency: Thin Diet NPO time specified  Skin:  Reviewed, no issues  Last BM:  10/28   Labs: reviewed  Meds: reviewed  Height:   Ht Readings from Last 1 Encounters:  10/14/16 6\' 1"  (1.854 m)    Weight:   Wt Readings from Last 1 Encounters:  10/14/16 230 lb (104.3 kg)    BMI:  Body mass index is 30.34 kg/m.  Estimated Nutritional Needs:   Kcal:  2000-2200 kcals  Protein:  100-110 g  Fluid:  >/= 2L  EDUCATION NEEDS:   No education needs identified at this time  [place], [place], [place] [phone] Pager  [phone] Weekend/On-Call Pager

## 2016-10-15 NOTE — Progress Notes (Signed)
Dr. Burt Knack called for patient's request for Tylenol for headache. Acknowledged, new order written. Barbaraann Faster, RN 10:16 PM 10/15/2016

## 2016-10-15 NOTE — Progress Notes (Signed)
Pt was tired and had very quiet responses. Pt retired from career caring for elderly. CH offered prayer. [place] available for follow up.   10/15/16 1030  Clinical Encounter Type  Visited With Patient  Visit Type Initial;Spiritual support  Referral From Nurse  Spiritual Encounters  Spiritual Needs Prayer;Emotional  Stress Factors  Patient Stress Factors Exhausted

## 2016-10-15 NOTE — Progress Notes (Addendum)
Patient developed severe itching and hives to back, chest, face and hands within minutes of initiating Cipro. Denies difficulty breathing, no edema noted, VSS. Cipro stopped immediately, site flushed and unremarkable. Dr. Burt Knack notified, new orders received. Will monitor patient closely. Educated patient on s/s of anaphylactic reaction and advised to notify RN of any s/s. Immediately.  Addendum: Hives and itching resolved with administration of Benedryl. Pt. Tolerated benedryl well, no evidence of distress. Will continue to monitor.

## 2016-10-16 ENCOUNTER — Encounter
Admission: EM | Disposition: A | Discharge status: Home / Self Care | Payer: Self-pay | Source: Home / Self Care | Attending: Surgery

## 2016-10-16 ENCOUNTER — Inpatient Hospital Stay: Payer: Medicare HMO | Admitting: Anesthesiology

## 2016-10-16 ENCOUNTER — Inpatient Hospital Stay: Payer: Medicare HMO

## 2016-10-16 ENCOUNTER — Encounter: Payer: Self-pay | Admitting: Surgery

## 2016-10-16 HISTORY — PX: CHOLECYSTECTOMY: SHX55

## 2016-10-16 LAB — BASIC METABOLIC PANEL
ANION GAP: 7 (ref 5–15)
BUN: 6 mg/dL (ref 6–20)
CALCIUM: 8.9 mg/dL (ref 8.9–10.3)
CO2: 27 mmol/L (ref 22–32)
Chloride: 102 mmol/L (ref 101–111)
Creatinine, Ser: 0.87 mg/dL (ref 0.44–1.00)
GFR calc non Af Amer: >60 mL/min (ref >60)
Glucose, Bld: 102 mg/dL — ABNORMAL HIGH (ref 65–99)
Potassium: 3.3 mmol/L — ABNORMAL LOW (ref 3.5–5.1)
Sodium: 136 mmol/L (ref 135–145)

## 2016-10-16 LAB — LIPASE, BLOOD
LIPASE: 38 U/L (ref 11–51)
LIPASE: 28 U/L (ref 11–51)

## 2016-10-16 LAB — HEPATIC FUNCTION PANEL
ALBUMIN: 3.5 g/dL (ref 3.5–5.0)
ALT: 602 U/L — ABNORMAL HIGH (ref 14–54)
AST: 312 U/L — AB (ref 15–41)
Alkaline Phosphatase: 178 U/L — ABNORMAL HIGH (ref 38–126)
BILIRUBIN DIRECT: 0.2 mg/dL (ref 0.1–0.5)
Indirect Bilirubin: 0.4 mg/dL (ref 0.3–0.9)
TOTAL PROTEIN: 7.4 g/dL (ref 6.5–8.1)
Total Bilirubin: 0.6 mg/dL (ref 0.3–1.2)

## 2016-10-16 SURGERY — LAPAROSCOPIC CHOLECYSTECTOMY WITH INTRAOPERATIVE CHOLANGIOGRAM
Anesthesia: General | Wound class: Clean Contaminated

## 2016-10-16 MED ORDER — TRAMADOL HCL 50 MG PO TABS
100.0000 mg | ORAL_TABLET | Freq: Four times a day (QID) | ORAL | Status: DC | PRN
  Administered 2016-10-16 – 2016-10-19 (×5): 100 mg via ORAL
  Filled 2016-10-16 (×5): qty 2

## 2016-10-16 MED ORDER — ONDANSETRON HCL 4 MG/2ML IJ SOLN: 4.0000 mg | Freq: Once | INTRAMUSCULAR | Status: DC | PRN

## 2016-10-16 MED ORDER — LACTATED RINGERS IV SOLN
INTRAVENOUS | Status: DC | PRN
  Administered 2016-10-16: 08:00:00 via INTRAVENOUS

## 2016-10-16 MED ORDER — FENTANYL CITRATE (PF) 100 MCG/2ML IJ SOLN
INTRAMUSCULAR | Status: AC
  Administered 2016-10-16: 25 ug via INTRAVENOUS
  Filled 2016-10-16: qty 2

## 2016-10-16 MED ORDER — HYDROMORPHONE HCL 1 MG/ML IJ SOLN
0.5000 mg | INTRAMUSCULAR | Status: DC | PRN
  Administered 2016-10-16 – 2016-10-18 (×4): 0.5 mg via INTRAVENOUS
  Filled 2016-10-16 (×4): qty 1

## 2016-10-16 MED ORDER — FENTANYL CITRATE (PF) 100 MCG/2ML IJ SOLN
INTRAMUSCULAR | Status: DC | PRN
  Administered 2016-10-16: 50 ug via INTRAVENOUS
  Administered 2016-10-16: 100 ug via INTRAVENOUS
  Administered 2016-10-16 (×2): 50 ug via INTRAVENOUS

## 2016-10-16 MED ORDER — EPHEDRINE SULFATE 50 MG/ML IJ SOLN
INTRAMUSCULAR | Status: DC | PRN
  Administered 2016-10-16: 10 mg via INTRAVENOUS

## 2016-10-16 MED ORDER — ACETAMINOPHEN 10 MG/ML IV SOLN
INTRAVENOUS | Status: DC | PRN
  Administered 2016-10-16: 1000 mg via INTRAVENOUS

## 2016-10-16 MED ORDER — KETOROLAC TROMETHAMINE 30 MG/ML IJ SOLN
INTRAMUSCULAR | Status: DC | PRN
  Administered 2016-10-16: 30 mg via INTRAVENOUS

## 2016-10-16 MED ORDER — ONDANSETRON HCL 4 MG/2ML IJ SOLN
4.0000 mg | Freq: Four times a day (QID) | INTRAMUSCULAR | Status: DC | PRN
  Administered 2016-10-16 (×2): 4 mg via INTRAVENOUS
  Filled 2016-10-16: qty 2

## 2016-10-16 MED ORDER — KCL IN DEXTROSE-NACL 30-5-0.45 MEQ/L-%-% IV SOLN
INTRAVENOUS | Status: DC
  Administered 2016-10-16 – 2016-10-18 (×4): via INTRAVENOUS
  Filled 2016-10-16 (×3): qty 1000

## 2016-10-16 MED ORDER — PROPOFOL 10 MG/ML IV BOLUS
INTRAVENOUS | Status: DC | PRN
  Administered 2016-10-16: 150 mg via INTRAVENOUS

## 2016-10-16 MED ORDER — SUCCINYLCHOLINE CHLORIDE 20 MG/ML IJ SOLN
INTRAMUSCULAR | Status: DC | PRN
  Administered 2016-10-16: 100 mg via INTRAVENOUS

## 2016-10-16 MED ORDER — MIDAZOLAM HCL 2 MG/2ML IJ SOLN
INTRAMUSCULAR | Status: DC | PRN
  Administered 2016-10-16: 2 mg via INTRAVENOUS

## 2016-10-16 MED ORDER — HYDROCODONE-ACETAMINOPHEN 5-325 MG PO TABS
1.0000 | ORAL_TABLET | Freq: Four times a day (QID) | ORAL | Status: DC | PRN
  Administered 2016-10-17 – 2016-10-18 (×6): 2 via ORAL
  Filled 2016-10-16 (×4): qty 2
  Filled 2016-10-16: qty 1
  Filled 2016-10-16 (×2): qty 2

## 2016-10-16 MED ORDER — LIDOCAINE HCL (CARDIAC) 20 MG/ML IV SOLN
INTRAVENOUS | Status: DC | PRN
  Administered 2016-10-16: 80 mg via INTRAVENOUS

## 2016-10-16 MED ORDER — ROCURONIUM BROMIDE 100 MG/10ML IV SOLN
INTRAVENOUS | Status: DC | PRN
  Administered 2016-10-16: 10 mg via INTRAVENOUS
  Administered 2016-10-16: 40 mg via INTRAVENOUS

## 2016-10-16 MED ORDER — SUGAMMADEX SODIUM 500 MG/5ML IV SOLN
INTRAVENOUS | Status: DC | PRN
  Administered 2016-10-16: 208.6 mg via INTRAVENOUS

## 2016-10-16 MED ORDER — DEXAMETHASONE SODIUM PHOSPHATE 10 MG/ML IJ SOLN
INTRAMUSCULAR | Status: DC | PRN
  Administered 2016-10-16: 10 mg via INTRAVENOUS

## 2016-10-16 MED ORDER — PHENYLEPHRINE HCL 10 MG/ML IJ SOLN
INTRAMUSCULAR | Status: DC | PRN
  Administered 2016-10-16 (×4): 100 ug via INTRAVENOUS

## 2016-10-16 MED ORDER — FENTANYL CITRATE (PF) 100 MCG/2ML IJ SOLN
25.0000 ug | INTRAMUSCULAR | Status: DC | PRN
  Administered 2016-10-16 (×4): 25 ug via INTRAVENOUS

## 2016-10-16 MED ORDER — BUPIVACAINE HCL (PF) 0.25 % IJ SOLN
INTRAMUSCULAR | Status: DC | PRN
  Administered 2016-10-16: 30 mL

## 2016-10-16 MED ORDER — SODIUM CHLORIDE 0.9 % IV SOLN
INTRAVENOUS | Status: DC | PRN
  Administered 2016-10-16: 10 mL

## 2016-10-16 SURGICAL SUPPLY — 45 items
APPLIER CLIP ROT 10 11.4 M/L (STAPLE) ×3
APR CLP MED LRG 11.4X10 (STAPLE) ×1
BAG COUNTER SPONGE EZ (MISCELLANEOUS) ×2 IMPLANT
BAG SPNG 4X4 CLR HAZ (MISCELLANEOUS) ×1
CANISTER SUCT 1200ML W/VALVE (MISCELLANEOUS) ×3 IMPLANT
CATH REDDICK CHOLANGI 4FR 50CM (CATHETERS) ×3 IMPLANT
CHLORAPREP W/TINT 26ML (MISCELLANEOUS) ×3 IMPLANT
CLIP APPLIE ROT 10 11.4 M/L (STAPLE) ×1 IMPLANT
CONRAY 60ML FOR OR (MISCELLANEOUS) ×3 IMPLANT
COUNTER SPONGE BAG EZ (MISCELLANEOUS) ×1
DRAPE SHEET LG 3/4 BI-LAMINATE (DRAPES) ×3 IMPLANT
DRSG TEGADERM 2-3/8X2-3/4 SM (GAUZE/BANDAGES/DRESSINGS) ×12 IMPLANT
DRSG TELFA 3X8 NADH (GAUZE/BANDAGES/DRESSINGS) ×3 IMPLANT
ELECT REM PT RETURN 9FT ADLT (ELECTROSURGICAL) ×3
ELECTRODE REM PT RTRN 9FT ADLT (ELECTROSURGICAL) ×1 IMPLANT
GLOVE BIO SURGEON STRL SZ7.5 (GLOVE) ×3 IMPLANT
GLOVE INDICATOR 8.0 STRL GRN (GLOVE) ×3 IMPLANT
GOWN STRL REUS W/ TWL LRG LVL3 (GOWN DISPOSABLE) ×2 IMPLANT
GOWN STRL REUS W/TWL LRG LVL3 (GOWN DISPOSABLE) ×9
GRASPER SUT TROCAR 14GX15 (MISCELLANEOUS) ×3 IMPLANT
IRRIGATION STRYKERFLOW (MISCELLANEOUS) ×1 IMPLANT
IRRIGATOR STRYKERFLOW (MISCELLANEOUS) ×3
IV NS 1000ML (IV SOLUTION) ×3
IV NS 1000ML BAXH (IV SOLUTION) ×1 IMPLANT
LABEL OR SOLS (LABEL) ×3 IMPLANT
NDL HYPO 25X1 1.5 SAFETY (NEEDLE) ×1 IMPLANT
NDL INSUFFLATION 14GA 120MM (NEEDLE) ×1 IMPLANT
NDL SAFETY 18GX1.5 (NEEDLE) ×3 IMPLANT
NEEDLE HYPO 25X1 1.5 SAFETY (NEEDLE) ×3 IMPLANT
NEEDLE INSUFFLATION 14GA 120MM (NEEDLE) ×3 IMPLANT
NS IRRIG 500ML POUR BTL (IV SOLUTION) ×3 IMPLANT
PACK LAP CHOLECYSTECTOMY (MISCELLANEOUS) ×3 IMPLANT
PAD DRESSING TELFA 3X8 NADH (GAUZE/BANDAGES/DRESSINGS) ×1 IMPLANT
POUCH ENDO CATCH 10MM SPEC (MISCELLANEOUS) ×3 IMPLANT
SCISSORS METZENBAUM CVD 33 (INSTRUMENTS) ×3 IMPLANT
SEAL FOR SCOPE WARMER C3101 (MISCELLANEOUS) ×1 IMPLANT
SLEEVE ADV FIXATION 5X100MM (TROCAR) ×3 IMPLANT
SUT ETHILON 5-0 FS-2 18 BLK (SUTURE) ×3 IMPLANT
SUT VIC AB 0 CT2 27 (SUTURE) ×3 IMPLANT
SYR 3ML LL SCALE MARK (SYRINGE) ×3 IMPLANT
TROCAR Z-THREAD FIOS 11X100 BL (TROCAR) ×3 IMPLANT
TROCAR Z-THREAD OPTICAL 5X100M (TROCAR) ×3 IMPLANT
TROCAR Z-THREAD SLEEVE 11X100 (TROCAR) ×3 IMPLANT
TUBING INSUFFLATOR HI FLOW (MISCELLANEOUS) ×3 IMPLANT
WATER STERILE IRR 1000ML POUR (IV SOLUTION) ×3 IMPLANT

## 2016-10-16 NOTE — Progress Notes (Signed)
Report given to OR staff; out to OR with OR nurses via bed. Barbaraann Faster, RN 6:30 AM 10/16/2016

## 2016-10-16 NOTE — Anesthesia Preprocedure Evaluation (Signed)
Anesthesia Evaluation  Patient identified by MRN, date of birth, ID band Patient awake    Reviewed: Allergy & Precautions, H&P , NPO status , Patient's Chart, lab work & pertinent test results, reviewed documented beta blocker date and time   Airway Mallampati: II  TM Distance: >3 FB Neck ROM: full    Dental  (+) Teeth Intact   Pulmonary neg pulmonary ROS,    Pulmonary exam normal        Cardiovascular hypertension, negative cardio ROS Normal cardiovascular exam Rhythm:regular Rate:Normal     Neuro/Psych  Headaches, PSYCHIATRIC DISORDERS negative neurological ROS  negative psych ROS   GI/Hepatic negative GI ROS, Neg liver ROS, GERD  Medicated,  Endo/Other  negative endocrine ROS  Renal/GU negative Renal ROS  negative genitourinary   Musculoskeletal   Abdominal   Peds  Hematology negative hematology ROS (+)   Anesthesia Other Findings Past Medical History: No date: Anxiety No date: Chronic hip pain No date: Chronic pain syndrome No date: Depression No date: Edema No date: Fatigue No date: Fibromyalgia No date: GERD (gastroesophageal reflux disease) No date: Headache No date: Hypertension No date: Insomnia No date: Low back pain No date: Vitamin D deficiency Past Surgical History: No date: ABDOMINAL HYSTERECTOMY No date: BACK SURGERY No date: BREAST SURGERY No date: cyst removal from wrist No date: ECTOPIC PREGNANCY SURGERY No date: HIP SURGERY     Comment: x4 BMI    Body Mass Index:  30.34 kg/m     Reproductive/Obstetrics negative OB ROS                             Anesthesia Physical Anesthesia Plan  ASA: III and emergent  Anesthesia Plan: General ETT   Post-op Pain Management:    Induction:   Airway Management Planned:   Additional Equipment:   Intra-op Plan:   Post-operative Plan:   Informed Consent: I have reviewed the patients History and Physical,  chart, labs and discussed the procedure including the risks, benefits and alternatives for the proposed anesthesia with the patient or authorized representative who has indicated his/her understanding and acceptance.   Dental Advisory Given  Plan Discussed with: CRNA  Anesthesia Plan Comments:         Anesthesia Quick Evaluation

## 2016-10-16 NOTE — Transfer of Care (Signed)
Immediate Anesthesia Transfer of Care Note  [name]  Procedure(s) Performed: Procedure(s): LAPAROSCOPIC CHOLECYSTECTOMY WITH INTRAOPERATIVE CHOLANGIOGRAM (N/A)  Patient Location: PACU  Anesthesia Type:General  Level of Consciousness: sedated  Airway & Oxygen Therapy: Patient Spontanous Breathing and Patient connected to face mask oxygen  Post-op Assessment: Report given to RN and Post -op Vital signs reviewed and stable  Post vital signs: Reviewed and stable  Last Vitals:  Vitals:   10/15/16 2112 10/16/16 0454  BP: 106/62 135/76  Pulse: 76 70  Resp: 18 18  Temp: 36.8 C 37.2 C    Last Pain:  Vitals:   10/16/16 0454  TempSrc: Oral  PainSc:          Complications: No apparent anesthesia complications

## 2016-10-16 NOTE — Progress Notes (Signed)
Per Pt, she is allergic to hydrocodone. It makes her "throw up and get a rash."

## 2016-10-16 NOTE — Progress Notes (Signed)
Dr. Burt Knack called for antiemetic, patient experiencing nausea/vomitting; acknowledged, new order written. Barbaraann Faster, RN 5:40 AM 10/16/2016

## 2016-10-16 NOTE — Progress Notes (Signed)
[place] rounding visited the Pt, but Pt was sleeping at the time of the visit. [place] talked to the Pt's husband who was wearing the [employer]eler[employer] T-shirt. The conversation centered around football and then ending with a request for prayers for his wife. [place] prayed for the wife and promised to visit the Pt again at an appropriate time to provide spiritual support and prayer.    10/16/16 1500  Clinical Encounter Type  Visited With Patient and family together  Visit Type Initial;Spiritual support  Referral From Nurse  Spiritual Encounters  Spiritual Needs Prayer;Other (Comment)  Stress Factors  Patient Stress Factors Exhausted  Family Stress Factors None identified

## 2016-10-16 NOTE — Op Note (Signed)
10/14/2016 - 10/16/2016  8:57 AM  PATIENT:  Maria Hamilton  65 y.o. female  PRE-OPERATIVE DIAGNOSIS:  N/A  POST-OPERATIVE DIAGNOSIS:  N/A  PROCEDURE:  Procedure(s): LAPAROSCOPIC CHOLECYSTECTOMY WITH INTRAOPERATIVE CHOLANGIOGRAM (N/A)  SURGEON:  Surgeon(s) and Role:    * Jeanie Cooks, MD - Primary   ASSISTANTS: none   ANESTHESIA:   general  EBL:  Total I/O In: -  Out: 10 [Blood:10]   DRAINS: none   LOCAL MEDICATIONS USED:  MARCAINE      DISPOSITION OF SPECIMEN:  PATHOLOGY   DICTATION: .Dragon Dictation with the patient in supine position and after induction of appropriate general anesthesia the patient's abdomen was prepped ChloraPrep and draped sterile towels. The patient was placed headdown feet up position. Small infraumbilical incision was made in the standard fashion carried down bluntly through subcutaneous tissue. A varies needle was used cannulate peritoneal cavity. CO2 was insufflated to appropriate pressure measurements.  When approximately 2 L of CO2 were instilled a varies needle was withdrawn and an 11 mm port placed into the peritoneal cavity. Intraperitoneal position was confirmed and CO2 was reinsufflated.  The patient's place head up feet down position rotated slightly to the left side. Subxiphoid transverse incision was made 11 mm port inserted under direct vision. 2 lateral ports 5 mm in size were inserted under direct vision. The abdomen was inspected. The camera was moved to the upper port and the umbilical incision appeared to be safe without any evidence of bowel injury. Gallbladder was retracted superiorly and laterally. There were multiple adhesions along the lateral edge of the gallbladder which were taken down with combination of blunt and Bovie dissection. The hepatoduodenal ligament was identified. Cystic artery and cystic duct were identified. Cystic duct was clipped and opened. An on table cholangiogram using dynamic fluoroscopy revealed free  flow of dye into the duodenum pancreatic duct was visualized. No obstruction was seen. Proximal duct was visualized. The catheter was withdrawn and the cystic duct doubly clipped on the common duct side and divided.  Cystic artery was identified doubly clipped and divided. The gallbladder was then dissected free from its bed in the liver using the hook and cautery apparatus. Once the gallbladder was free was captured in an Endo Catch apparatus removed through the subxiphoid incision. The area was copiously suction irrigated. The upper midline incision was closed with figure-of-eight sutures of 0 Vicryl using the suture passer. The abdomen was then desufflated. Skin incisions were all closed with 5-0 nylon. The area was infiltrated with 0.25% Marcaine for postoperative pain control. Sterile dressings were applied. The patient returned recovery room having tolerated the procedure well. Sponge instrument needle count were correct 2 in the operating room  PLAN OF CARE: Admit to inpatient   PATIENT DISPOSITION:  PACU - hemodynamically stable.   Dia Crawford III, MD

## 2016-10-16 NOTE — Plan of Care (Signed)
Problem: Activity: Goal: Risk for activity intolerance will decrease Outcome: Progressing Pt has been up to the [facility] since surgery.

## 2016-10-16 NOTE — Progress Notes (Signed)
OR RN, Coralyn Mark has been notified twice to open the correct parameters for the pt's lap sites.

## 2016-10-16 NOTE — Progress Notes (Signed)

## 2016-10-16 NOTE — Anesthesia Procedure Notes (Signed)
Procedure Name: Intubation Date/Time: 10/16/2016 8:08 AM Performed by: Nelda Marseille Pre-anesthesia Checklist: Patient identified, Patient being monitored, Timeout performed, Emergency Drugs available and Suction available Patient Re-evaluated:Patient Re-evaluated prior to inductionOxygen Delivery Method: Circle system utilized Preoxygenation: Pre-oxygenation with 100% oxygen Intubation Type: IV induction Ventilation: Mask ventilation without difficulty Laryngoscope Size: Mac and 3 Grade View: Grade I Tube type: Oral Tube size: 7.0 mm Number of attempts: 1 Airway Equipment and Method: Stylet Placement Confirmation: ETT inserted through vocal cords under direct vision,  positive ETCO2 and breath sounds checked- equal and bilateral Secured at: 21 cm Tube secured with: Tape Dental Injury: Teeth and Oropharynx as per pre-operative assessment

## 2016-10-17 LAB — CBC
HCT: 33.3 % — ABNORMAL LOW (ref 35.0–47.0)
Hemoglobin: 11 g/dL — ABNORMAL LOW (ref 12.0–16.0)
MCH: 28 pg (ref 26.0–34.0)
MCHC: 33 g/dL (ref 32.0–36.0)
MCV: 84.8 fL (ref 80.0–100.0)
PLATELETS: 327 10*3/uL (ref 150–440)
RBC: 3.93 MIL/uL (ref 3.80–5.20)
RDW: 14.7 % — ABNORMAL HIGH (ref 11.5–14.5)
WBC: 10.6 10*3/uL (ref 3.6–11.0)

## 2016-10-17 LAB — SURGICAL PATHOLOGY

## 2016-10-17 NOTE — Progress Notes (Signed)
Subjective:   She is still having intermittent abdominal pain but no nausea or vomiting. She is tolerating liquids. Her lipase yesterday was down to 28. White counts 10,000 today.   Vital signs in last 24 hours: Temp:  [97.8 F (36.6 C)-98.3 F (36.8 C)] 98.3 F (36.8 C) (11/01 1323) Pulse Rate:  [71-94] 71 (11/01 1323) Resp:  [18-20] 18 (11/01 0438) BP: (120-133)/(76-99) 120/76 (11/01 1323) SpO2:  [97 %-99 %] 97 % (11/01 1323) Last BM Date: 10/13/16  Intake/Output from previous day: 10/31 0701 - 11/01 0700 In: 3325.9 [P.O.:600; I.V.:2725.9] Out: 860 [Urine:850; Blood:10]  Exam:  Her abdominal exam is really unremarkable. She is no significant tenderness no rebound and no guarding. She's breathing easily with no adventitious sounds.  Lab Results:  CBC  Recent Labs  10/15/16 0456 10/17/16 0514  WBC 5.9 10.6  HGB 12.6 11.0*  HCT 37.5 33.3*  PLT 284 327   CMP     Component Value Date/Time   NA 136 10/16/2016 0536   NA 137 03/02/2014 1131   K 3.3 (L) 10/16/2016 0536   K 3.2 (L) 03/02/2014 1131   CL 102 10/16/2016 0536   CL 104 03/02/2014 1131   CO2 27 10/16/2016 0536   CO2 30 03/02/2014 1131   GLUCOSE 102 (H) 10/16/2016 0536   GLUCOSE 88 03/02/2014 1131   BUN 6 10/16/2016 0536   BUN 5 (L) 03/02/2014 1131   CREATININE 0.87 10/16/2016 0536   CREATININE 0.83 03/02/2014 1131   CALCIUM 8.9 10/16/2016 0536   CALCIUM 8.5 03/02/2014 1131   PROT 7.4 10/16/2016 0536   ALBUMIN 3.5 10/16/2016 0536   AST 312 (H) 10/16/2016 0536   ALT 602 (H) 10/16/2016 0536   ALKPHOS 178 (H) 10/16/2016 0536   BILITOT 0.6 10/16/2016 0536   GFRNONAA >60 10/16/2016 0536   GFRNONAA >60 03/02/2014 1131   GFRAA >60 10/16/2016 0536   GFRAA >60 03/02/2014 1131   PT/INR No results for input(s): LABPROT, INR in the last 72 hours.  Studies/Results: Dg C-arm 1-60 Min-no Report  Result Date: 10/16/2016 CLINICAL DATA: cholelithiasis C-ARM 1-60 MINUTES Fluoroscopy was utilized by the requesting  physician.  No radiographic interpretation.    Assessment/Plan: We will increase her activity level and advance her diet and CL she does have the next 24 hours. She is in agreement.

## 2016-10-18 MED ORDER — POLYETHYLENE GLYCOL 3350 17 G PO PACK
17.0000 g | PACK | Freq: Every day | ORAL | Status: DC
  Administered 2016-10-18 – 2016-10-19 (×2): 17 g via ORAL
  Filled 2016-10-18 (×2): qty 1

## 2016-10-18 NOTE — Progress Notes (Signed)
10/18/2016  Subjective: Patient is 2 Days Post-Op s/p laparoscopic cholecystectomy with IOC.  No acute events.  Patient's tolerating a full liquid diet.  She reports sporadic mild nausea but no emesis.  Her pain is well controlled.  She has ambulated and voiding and is passing flatus but is concerned that she has not had a bowel movement yet.  Vital signs: Temp:  [97.9 F (36.6 C)-98.4 F (36.9 C)] 97.9 F (36.6 C) (11/02 1210) Pulse Rate:  [66-70] 66 (11/02 1210) Resp:  [9-22] 9 (11/02 1210) BP: (119-137)/(62-78) 129/62 (11/02 1210) SpO2:  [98 %-100 %] 99 % (11/02 1210)   Intake/Output: 11/01 0701 - 11/02 0700 In: 3238 [P.O.:1510; I.V.:1728] Out: 3000 [Urine:3000] Last BM Date:  (Pt unsure)  Physical Exam: Constitutional:  No acute distress Cardiac:  Regular rhythm and rate Pulm:  No respiratory distress or use of accessory muscles Abdomen:  Soft, nondistended, appropriately tender to palpation.  Incisions are clean, dry, and intact.  Sutures in place closing incisions.  Labs:   Recent Labs  10/17/16 0514  WBC 10.6  HGB 11.0*  HCT 33.3*  PLT 327    Recent Labs  10/16/16 0536  NA 136  K 3.3*  CL 102  CO2 27  GLUCOSE 102*  BUN 6  CREATININE 0.87  CALCIUM 8.9   No results for input(s): LABPROT, INR in the last 72 hours.  Imaging: No results found.  Assessment/Plan: 65 yo female s/p lap cholecystectomy and IOC.  --Will advance to heart healthy diet --D/C IV fluids as she's tolerating po --Start MiraLax today --Likely d/c to home tomorrow.   Melvyn Neth, [place]

## 2016-10-18 NOTE — Anesthesia Postprocedure Evaluation (Signed)
Anesthesia Post Note  Patient: Kehlani Collis  Procedure(s) Performed: Procedure(s) (LRB): LAPAROSCOPIC CHOLECYSTECTOMY WITH INTRAOPERATIVE CHOLANGIOGRAM (N/A)  Patient location during evaluation: PACU Anesthesia Type: General Level of consciousness: awake and alert Pain management: pain level controlled Vital Signs Assessment: post-procedure vital signs reviewed and stable Respiratory status: spontaneous breathing, nonlabored ventilation, respiratory function stable and patient connected to nasal cannula oxygen Cardiovascular status: blood pressure returned to baseline and stable Postop Assessment: no signs of nausea or vomiting Anesthetic complications: no    Last Vitals:  Vitals:   10/17/16 2000 10/18/16 0441  BP: 137/71 119/78  Pulse: 66 70  Resp: (!) 22 20  Temp: 36.9 C 36.7 C    Last Pain:  Vitals:   10/18/16 0942  TempSrc:   PainSc: [place] Keonda Dow

## 2016-10-18 NOTE — Care Management Important Message (Signed)
Important Message  Patient Details  Name: Maria Hamilton MRN: [id number] Date of Birth: [birth year]   Medicare Important Message Given:  Yes    Beverly Sessions, RN 10/18/2016, 2:35 PM

## 2016-10-19 MED ORDER — TRAMADOL HCL 50 MG PO TABS: 100.0000 mg | ORAL_TABLET | Freq: Four times a day (QID) | ORAL | 0 refills | Status: DC | PRN

## 2016-10-19 NOTE — Discharge Summary (Signed)
Patient ID: Maria Hamilton MRN: [id number] DOB/AGE: 1951-11-07 65 y.o.  Admit date: 10/14/2016 Discharge date: 10/19/2016  Discharge Diagnoses:  Biliary pancreatitis and acute cholecystitis  Procedures Performed: Laparoscopic cholecystectomy with cholangiography  Discharged Condition: good  Hospital Course: She was admitted the hospital with signs and symptoms consistent with acute biliary pancreatitis. Her symptoms quickly resolved and with her presentation elected to pursue surgical removal of her gallbladder to prevent a further similar problem. After appropriate preoperative preparation informed consent she was taken to surgery where she underwent laparoscopic cholecystectomy. The procedure was uncomplicated. She no particular postoperative problems. She is some mild pain control and nausea issues early was continued to improve over the last several days. Her wounds look good. There is no sign of any infection. She discharged home today to follow the office in 7-10 days time. Bathing activity drive instructions were given the patient.  Discharge Orders: Discharge Instructions    Diet - low sodium heart healthy    Complete by:  As directed    Driving Restrictions    Complete by:  As directed    Do not drive while taking pain medicine   Increase activity slowly    Complete by:  As directed    Lifting restrictions    Complete by:  As directed    Do not lift anything heavier than your dinner plate      Disposition: 01-Home or Self Care  Discharge Medications:  Current Facility-Administered Medications:  .  acetaminophen (TYLENOL) tablet 650 mg, 650 mg, Oral, Q4H PRN, Florene Glen, MD, 650 mg at 10/19/16 0311 .  amLODipine (NORVASC) tablet 5 mg, 5 mg, Oral, Daily, Florene Glen, MD, 5 mg at 10/18/16 0942 .  diphenhydrAMINE (BENADRYL) injection 50 mg, 50 mg, Intravenous, Q6H PRN, Florene Glen, MD, 50 mg at 10/17/16 0647 .  escitalopram (LEXAPRO) tablet 20 mg, 20 mg,  Oral, Daily, Florene Glen, MD, 20 mg at 10/18/16 0942 .  heparin injection 5,000 Units, 5,000 Units, Subcutaneous, Q8H, Florene Glen, MD, 5,000 Units at 10/19/16 0509 .  hydrochlorothiazide (HYDRODIURIL) tablet 25 mg, 25 mg, Oral, Daily, Florene Glen, MD, 25 mg at 10/18/16 0942 .  HYDROcodone-acetaminophen (NORCO/VICODIN) 5-325 MG per tablet 1-2 tablet, 1-2 tablet, Oral, Q6H PRN, Dia Crawford III, MD, 2 tablet at 10/18/16 2013 .  HYDROmorphone (DILAUDID) injection 0.5 mg, 0.5 mg, Intravenous, Q2H PRN, Florene Glen, MD, 0.5 mg at 10/18/16 0340 .  ondansetron (ZOFRAN) injection 4 mg, 4 mg, Intravenous, Q6H PRN, Florene Glen, MD, 4 mg at 10/16/16 0800 .  polyethylene glycol (MIRALAX / GLYCOLAX) packet 17 g, 17 g, Oral, Daily, Olean Ree, MD, 17 g at 10/18/16 1709 .  traMADol (ULTRAM) tablet 100 mg, 100 mg, Oral, Q6H PRN, Dia Crawford III, MD, 100 mg at 10/19/16 0012  Follwup: Follow-up [facility] up in 1 week(s).   Specialty:  General Surgery Contact information: [address] [place]          Signed: Dia Crawford III 10/19/2016, 8:48 AM

## 2016-10-19 NOTE — Progress Notes (Signed)
Discharge instructions reviewed with the pt.  rx for tramadol given to the pt.  Pt will be sent out via wheelchair to her sisters car when the sister arrives

## 2016-10-19 NOTE — Discharge Instructions (Signed)

## 2016-10-22 ENCOUNTER — Other Ambulatory Visit: Payer: Self-pay

## 2016-10-23 ENCOUNTER — Emergency Department: Payer: Medicare HMO

## 2016-10-23 ENCOUNTER — Emergency Department
Admission: EM | Admit: 2016-10-23 | Discharge: 2016-10-24 | Disposition: A | Discharge status: Home / Self Care | Payer: Medicare HMO | Attending: Emergency Medicine | Admitting: Emergency Medicine

## 2016-10-23 ENCOUNTER — Telehealth: Payer: Self-pay | Admitting: Family Medicine

## 2016-10-23 DIAGNOSIS — Z79899 Other long term (current) drug therapy: Secondary | ICD-10-CM | POA: Diagnosis not present

## 2016-10-23 DIAGNOSIS — I10 Essential (primary) hypertension: Secondary | ICD-10-CM | POA: Insufficient documentation

## 2016-10-23 DIAGNOSIS — R300 Dysuria: Secondary | ICD-10-CM | POA: Insufficient documentation

## 2016-10-23 DIAGNOSIS — R109 Unspecified abdominal pain: Secondary | ICD-10-CM

## 2016-10-23 DIAGNOSIS — K5900 Constipation, unspecified: Secondary | ICD-10-CM

## 2016-10-23 DIAGNOSIS — R1011 Right upper quadrant pain: Secondary | ICD-10-CM | POA: Diagnosis present

## 2016-10-23 LAB — URINALYSIS COMPLETE WITH MICROSCOPIC (ARMC ONLY)
BACTERIA UA: NONE SEEN
BILIRUBIN URINE: NEGATIVE
GLUCOSE, UA: NEGATIVE mg/dL
Hgb urine dipstick: NEGATIVE
Ketones, ur: NEGATIVE mg/dL
Leukocytes, UA: NEGATIVE
NITRITE: NEGATIVE
Protein, ur: NEGATIVE mg/dL
Specific Gravity, Urine: 1.011 (ref 1.005–1.030)
pH: 6 (ref 5.0–8.0)

## 2016-10-23 LAB — CBC WITH DIFFERENTIAL/PLATELET
BASOS ABS: 0.1 10*3/uL (ref 0–0.1)
Basophils Relative: 1 %
EOS PCT: 3 %
Eosinophils Absolute: 0.2 10*3/uL (ref 0–0.7)
HCT: 38.7 % (ref 35.0–47.0)
Hemoglobin: 12.6 g/dL (ref 12.0–16.0)
LYMPHS PCT: 20 %
Lymphs Abs: 1.6 10*3/uL (ref 1.0–3.6)
MCH: 27.1 pg (ref 26.0–34.0)
MCHC: 32.6 g/dL (ref 32.0–36.0)
MCV: 83.3 fL (ref 80.0–100.0)
MONO ABS: 0.8 10*3/uL (ref 0.2–0.9)
Monocytes Relative: 11 %
Neutro Abs: 5.2 10*3/uL (ref 1.4–6.5)
Neutrophils Relative %: 65 %
PLATELETS: 382 10*3/uL (ref 150–440)
RBC: 4.65 MIL/uL (ref 3.80–5.20)
RDW: 14.4 % (ref 11.5–14.5)
WBC: 7.9 10*3/uL (ref 3.6–11.0)

## 2016-10-23 LAB — LIPASE, BLOOD: Lipase: 21 U/L (ref 11–51)

## 2016-10-23 LAB — LACTIC ACID, PLASMA: Lactic Acid, Venous: 2.8 mmol/L (ref 0.5–1.9)

## 2016-10-23 LAB — COMPREHENSIVE METABOLIC PANEL
ALT: 254 U/L — AB (ref 14–54)
AST: 124 U/L — AB (ref 15–41)
Albumin: 3.6 g/dL (ref 3.5–5.0)
Alkaline Phosphatase: 124 U/L (ref 38–126)
Anion gap: 9 (ref 5–15)
BILIRUBIN TOTAL: 0.5 mg/dL (ref 0.3–1.2)
BUN: 8 mg/dL (ref 6–20)
CO2: 25 mmol/L (ref 22–32)
Calcium: 9.3 mg/dL (ref 8.9–10.3)
Chloride: 102 mmol/L (ref 101–111)
Creatinine, Ser: 0.86 mg/dL (ref 0.44–1.00)
Glucose, Bld: 116 mg/dL — ABNORMAL HIGH (ref 65–99)
Potassium: 3.3 mmol/L — ABNORMAL LOW (ref 3.5–5.1)
Sodium: 136 mmol/L (ref 135–145)
Total Protein: 7.7 g/dL (ref 6.5–8.1)

## 2016-10-23 MED ORDER — SODIUM CHLORIDE 0.9 % IV BOLUS (SEPSIS)
1000.0000 mL | Freq: Once | INTRAVENOUS | Status: AC
Start: 2016-10-23 — End: 2016-10-23
  Administered 2016-10-23: 1000 mL via INTRAVENOUS

## 2016-10-23 MED ORDER — SODIUM CHLORIDE 0.9 % IV BOLUS (SEPSIS)
1000.0000 mL | Freq: Once | INTRAVENOUS | Status: AC
  Administered 2016-10-23: 1000 mL via INTRAVENOUS

## 2016-10-23 MED ORDER — IOPAMIDOL (ISOVUE-300) INJECTION 61%
30.0000 mL | Freq: Once | INTRAVENOUS | Status: AC
  Administered 2016-10-23: 30 mL via ORAL

## 2016-10-23 MED ORDER — MORPHINE SULFATE (PF) 2 MG/ML IV SOLN
4.0000 mg | Freq: Once | INTRAVENOUS | Status: AC
  Administered 2016-10-23: 4 mg via INTRAVENOUS
  Filled 2016-10-23: qty 2

## 2016-10-23 MED ORDER — IOPAMIDOL (ISOVUE-300) INJECTION 61%
100.0000 mL | Freq: Once | INTRAVENOUS | Status: AC | PRN
  Administered 2016-10-23: 100 mL via INTRAVENOUS

## 2016-10-23 MED ORDER — ONDANSETRON HCL 4 MG/2ML IJ SOLN
4.0000 mg | Freq: Once | INTRAMUSCULAR | Status: AC
  Administered 2016-10-23: 4 mg via INTRAVENOUS
  Filled 2016-10-23: qty 2

## 2016-10-23 MED ORDER — HYDROMORPHONE HCL 1 MG/ML IJ SOLN
0.5000 mg | Freq: Once | INTRAMUSCULAR | Status: DC
  Filled 2016-10-23: qty 1

## 2016-10-23 MED ORDER — DIPHENHYDRAMINE HCL 50 MG/ML IJ SOLN
50.0000 mg | Freq: Once | INTRAMUSCULAR | Status: AC
  Administered 2016-10-23: 50 mg via INTRAVENOUS
  Filled 2016-10-23: qty 1

## 2016-10-23 NOTE — ED Notes (Signed)
Patient ambulated to and from bathroom with a steady gait. 

## 2016-10-23 NOTE — Telephone Encounter (Signed)
Pt husband called and asked if Dr. Caryl Bis could fill pt's Lyrica. Pt is in a lot of pain and is in the hospital every 2 to 3 days. Pt has an appt with the pain clinic next month. Please advise, thank you!  Pharmacy - [facility] [place], [place] - [address]  Call pt @ [phone]

## 2016-10-23 NOTE — ED Notes (Signed)
CT tech called and stated that patient was c/o extreme itching. Dr. Alfred Levins aware.

## 2016-10-23 NOTE — ED Notes (Signed)
Patient left for imaging. 

## 2016-10-23 NOTE — ED Provider Notes (Signed)
[facility] Emergency Department Provider Note  ____________________________________________  Time seen: Approximately 6:04 PM  I have reviewed the triage vital signs and the nursing notes.   HISTORY  Chief Complaint Post-op Problem   HPI Maria Hamilton is a 65 y.o. female POD 8 from cholecystectomy for biliary pancreatitis, fibromyalgia, chronic pain who presents for evaluation of abdominal pain. Patient reports that pain started Saturday evening she describes it as a spasm, located in the right upper quadrant, intermittent, nonradiating, 8 out of 10. Patient reports that she has not had a bowel movement for 3 weeks and she has been taking 1 cap of MiraLAX a day since her surgery. Patient reports that she was not passing flatus until yesterday but she started to pass flatus today however still no BM. No nausea, no vomiting, no fever, no chest pain, no shortness of breath. Patient does endorse dysuria since the surgery and according to the husband she has been having chills for the last few days. She has tried tramadol at home with improvement of the pain initially however that is not working anymore.  Past Medical History:  Diagnosis Date  . Anxiety   . Chronic hip pain   . Chronic pain syndrome   . Depression   . Edema   . Fatigue   . Fibromyalgia   . GERD (gastroesophageal reflux disease)   . Headache   . Hypertension   . Insomnia   . Low back pain   . Vitamin D deficiency     Patient Active Problem List   Diagnosis Date Noted  . Acute pancreatitis 10/14/2016  . Cholecystitis   . Altered mental status 10/12/2016  . Hypokalemia 09/27/2016  . Long term current use of opiate analgesic 09/24/2016  . Long term prescription opiate use 09/24/2016  . Opiate use 09/24/2016  . Encounter for therapeutic drug level monitoring 09/24/2016  . Encounter for pain management planning 09/24/2016  . Rheumatoid arthritis, multiple sites (positive RF) 09/24/2016   . Chronic lower extremity pain (Location of Secondary source of pain) (Left) 09/24/2016  . Lumbar facet arthropathy (Bilateral) 09/24/2016  . Failed back surgical syndrome (x 3) 09/24/2016  . Epidural fibrosis 09/24/2016  . Neurogenic pain 09/24/2016  . Musculoskeletal pain 09/24/2016  . Chronic lumbar radicular pain (L5/S1 dermatome) (Location of Secondary source of pain) (Left) 09/24/2016  . History of total hip replacement, bilateral 09/24/2016  . Chronic pain 09/21/2016  . GERD (gastroesophageal reflux disease) 08/03/2015  . Acute anxiety 08/03/2015  . Rheumatoid factor positive 08/03/2015  . Fatigue 08/03/2015  . Vitamin D deficiency 08/03/2015  . Insomnia 08/03/2015  . Fibromyalgia 08/03/2015  . Chronic low back pain (Location of Primary Source of Pain) (Left) 08/03/2015  . Chronic pain syndrome 08/03/2015  . Depression 08/03/2015  . Hypertension 08/03/2015  . Cellulitis due to MRSA 08/03/2015  . Mechanical complication of internal joint prosthesis ([place]) 04/22/2012    Past Surgical History:  Procedure Laterality Date  . ABDOMINAL HYSTERECTOMY    . BACK SURGERY    . BREAST SURGERY    . CHOLECYSTECTOMY N/A 10/16/2016   Procedure: LAPAROSCOPIC CHOLECYSTECTOMY WITH INTRAOPERATIVE CHOLANGIOGRAM;  Surgeon: Dia Crawford III, MD;  Location: [facility] ORS;  Service: General;  Laterality: N/A;  . cyst removal from wrist    . ECTOPIC PREGNANCY SURGERY    . HIP SURGERY     x4    Prior to Admission medications   Medication Sig Start Date End Date Taking? Authorizing Provider  amLODipine (NORVASC) 5  MG tablet Take 1 tablet (5 mg total) by mouth daily. 05/29/16  Yes Leone Haven, MD  Calcium Carbonate-Vit D-Min (CALCIUM 1200) 1200-1000 MG-UNIT CHEW Chew 2 tablets by mouth daily.   Yes Historical Provider, MD  diazepam (VALIUM) 2 MG tablet Take 1 tablet (2 mg total) by mouth every 8 (eight) hours as needed. Patient taking differently: Take 2 mg by mouth every 8 (eight) hours as needed  for anxiety.  10/04/16  Yes Cari B Triplett, FNP  escitalopram (LEXAPRO) 20 MG tablet Take 1 tablet (20 mg total) by mouth daily. 05/29/16  Yes Leone Haven, MD  hydrochlorothiazide (HYDRODIURIL) 25 MG tablet Take 1 tablet (25 mg total) by mouth daily. 05/29/16  Yes Leone Haven, MD  traMADol (ULTRAM) 50 MG tablet Take 2 tablets (100 mg total) by mouth every 6 (six) hours as needed for moderate pain. 10/19/16  Yes Dia Crawford III, MD  lidocaine (LIDODERM) 5 % Place 1 patch onto the skin every 12 (twelve) hours. Remove & Discard patch within 12 hours or as directed by MD Patient not taking: Reported on 10/23/2016 10/11/16 10/11/17  Earleen Newport, MD  ondansetron (ZOFRAN) 4 MG tablet Take 1 tablet (4 mg total) by mouth every 8 (eight) hours as needed for nausea or vomiting. Patient not taking: Reported on 10/23/2016 10/04/16   Victorino Dike, FNP  potassium chloride SA (K-DUR,KLOR-CON) 20 MEQ tablet Take 2 tablets (40 mEq total) by mouth 2 (two) times daily. Patient not taking: Reported on 10/23/2016 09/26/16   Leone Haven, MD    Allergies Ace inhibitors; Ciprofloxacin; Morphine and related; Penicillin g benzathine; and Tape  Family History  Problem Relation Age of Onset  . Heart disease Mother   . Cancer Father   . Alcohol abuse Father     Social History Social History  Substance Use Topics  . Smoking status: Never Smoker  . Smokeless tobacco: Never Used  . Alcohol use No    Review of Systems  Constitutional: Negative for fever. + chills Eyes: Negative for visual changes. ENT: Negative for sore throat. Cardiovascular: Negative for chest pain. Respiratory: Negative for shortness of breath. Gastrointestinal: + right sided abdominal pain, constipation. No vomiting or diarrhea. Genitourinary: + dysuria. Musculoskeletal: Negative for back pain. Skin: Negative for rash. Neurological: Negative for headaches, weakness or  numbness.  ____________________________________________   PHYSICAL EXAM:  VITAL SIGNS: ED Triage Vitals  Enc Vitals Group     BP 10/23/16 1750 118/79     Pulse Rate 10/23/16 1750 99     Resp 10/23/16 1750 18     Temp 10/23/16 1750 98.1 F (36.7 C)     Temp Source 10/23/16 1750 Oral     SpO2 10/23/16 1750 99 %     Weight 10/23/16 1750 230 lb (104.3 kg)     Height 10/23/16 1750 6\' 1"  (1.854 m)     Head Circumference --      Peak Flow --      Pain Score 10/23/16 1751 8     Pain Loc --      Pain Edu? --      Excl. in [place]? --     Constitutional: Alert and oriented. Well appearing and in no apparent distress. HEENT:      Head: Normocephalic and atraumatic.         Eyes: Conjunctivae are normal. Sclera is non-icteric. EOMI. PERRL      Mouth/Throat: Mucous membranes are moist.  Neck: Supple with no signs of meningismus. Cardiovascular: Regular rate and rhythm. No murmurs, gallops, or rubs. 2+ symmetrical distal pulses are present in all extremities. No JVD. Respiratory: Normal respiratory effort. Lungs are clear to auscultation bilaterally. No wheezes, crackles, or rhonchi.  Gastrointestinal: Soft, Well healing surgical scars, patient has tenderness to palpation on the right upper quadrant, non distended with positive bowel sounds. No rebound or guarding. Genitourinary: No CVA tenderness. Musculoskeletal: Nontender with normal range of motion in all extremities. No edema, cyanosis, or erythema of extremities. Neurologic: Normal speech and language. Face is symmetric. Moving all extremities. No gross focal neurologic deficits are appreciated. Skin: Skin is warm, dry and intact. No rash noted. Psychiatric: Mood and affect are normal. Speech and behavior are normal.  ____________________________________________   LABS (all labs ordered are listed, but only abnormal results are displayed)  Labs Reviewed  LACTIC ACID, PLASMA - Abnormal; Notable for the following:       Result  Value   Lactic Acid, Venous 2.8 (*)    All other components within normal limits  URINALYSIS COMPLETEWITH MICROSCOPIC ([facility] ONLY) - Abnormal; Notable for the following:    Color, Urine YELLOW (*)    APPearance CLEAR (*)    Squamous Epithelial / LPF 6-30 (*)    All other components within normal limits  COMPREHENSIVE METABOLIC PANEL - Abnormal; Notable for the following:    Potassium 3.3 (*)    Glucose, Bld 116 (*)    AST 124 (*)    ALT 254 (*)    All other components within normal limits  CBC WITH DIFFERENTIAL/PLATELET  LIPASE, BLOOD  LACTIC ACID, PLASMA   ____________________________________________  EKG  none  ____________________________________________  RADIOLOGY  KUB: Nonobstructed bowel gas pattern  CT a/p: Surgical absence of the gallbladder with mild stranding in the gallbladder fossa, changes in the anterior abdominal wall, and small amount of hemorrhagic fluid in the pelvis, likely representing postoperative changes. No discrete collection or significant abdominal fluid demonstrated. Bile ducts are not dilated. No evidence of bowel obstruction. Stool-filled colon. ____________________________________________   PROCEDURES  Procedure(s) performed: None Procedures Critical Care performed:  None ____________________________________________   INITIAL IMPRESSION / ASSESSMENT AND PLAN / ED COURSE  65 y.o. female POD 8 from cholecystectomy for biliary pancreatitis, fibromyalgia, chronic pain who presents for evaluation of abdominal pain x 3 days and constipation x 3 weeks. Patient is crying in moderate distress due to pain, her vital signs are within normal limits, her abdomen is soft, nondistended, she does have tenderness to palpation on the right upper quadrant with no rebound or guarding. Ddx constipation, SBO, retained stone, pancreatitis, or possible postop intra-abdominal complication including bile leak or abscess. We'll check CBC, CMP, lipase, urinalysis,  lactate, KUB.   Clinical Course as of Oct 24 10  Tue Oct 23, 2016  2300 CT showing large amount of stool  throughout the colon. CT also showing some post-op changes. I discussed the presentation, clinical picture, exam, labs, and CT results  with Dr. Hampton Abbot, surgeon on call. He evaluated the CT and agrees with no acute findings on CT. He will come down to evaluate the patient as patient has required multiple doses of narcotics. Will also give her an enema for constipation.   [CV]  2356 Dr. Hampton Abbot evaluated patient who feels better after surgical stitches were removed. Repeat lactate is pending, enema to be given. He will re-assess patient after lactate is resulted and decide if patient can go home or needs to come  in for pain control.   [CV]    Clinical Course User Index [CV] Rudene Re, MD    Pertinent labs & imaging results that were available during my care of the patient were reviewed by me and considered in my medical decision making (see chart for details).    ____________________________________________   FINAL CLINICAL IMPRESSION(S) / ED DIAGNOSES  Final diagnoses:  Abdominal pain  Constipation, unspecified constipation type      NEW MEDICATIONS STARTED DURING THIS VISIT:  New Prescriptions   No medications on file     Note:  This document was prepared using Dragon voice recognition software and may include unintentional dictation errors.    Rudene Re, MD 10/24/16 [phone]

## 2016-10-23 NOTE — ED Notes (Signed)
Patient states she is unable to void at this time.  

## 2016-10-23 NOTE — ED Triage Notes (Signed)
Pt had gall bladder removed last Tuesday , Dr Pat Patrick.. States was discharged on Friday and was doing ok, states the pain has slowing increased over the weekend and today is having severe pain with nausea , pt is diaphoretic on arrival, shaking, anxious.. C/o RLQ pain..states she has not had a BM in 3 weeks and has been taking miralax.

## 2016-10-23 NOTE — ED Notes (Signed)
Report given to Andrea RN

## 2016-10-24 DIAGNOSIS — R1011 Right upper quadrant pain: Secondary | ICD-10-CM | POA: Insufficient documentation

## 2016-10-24 DIAGNOSIS — K5900 Constipation, unspecified: Secondary | ICD-10-CM | POA: Insufficient documentation

## 2016-10-24 LAB — LACTIC ACID, PLASMA: LACTIC ACID, VENOUS: 1 mmol/L (ref 0.5–1.9)

## 2016-10-24 MED ORDER — BUPIVACAINE HCL (PF) 0.5 % IJ SOLN
INTRAMUSCULAR | Status: DC
Start: 2016-10-24 — End: 2016-10-24
  Filled 2016-10-24: qty 30

## 2016-10-24 MED ORDER — LACTULOSE 10 GM/15ML PO SOLN
30.0000 g | Freq: Once | ORAL | Status: AC
  Administered 2016-10-24: 30 g via ORAL
  Filled 2016-10-24: qty 60

## 2016-10-24 MED ORDER — MINERAL OIL RE ENEM
1.0000 | ENEMA | Freq: Once | RECTAL | Status: AC
  Administered 2016-10-24: 1 via RECTAL

## 2016-10-24 MED ORDER — MAGNESIUM CITRATE PO SOLN
ORAL | Status: AC
  Filled 2016-10-24: qty 296

## 2016-10-24 MED ORDER — LACTULOSE 10 GM/15ML PO SOLN: 20.0000 g | Freq: Every day | ORAL | 0 refills | Status: DC | PRN

## 2016-10-24 NOTE — ED Notes (Signed)
Pt states she is feeling much better and asking if she can take her enema at home instead of having it administered here in the hospital. Dr. Beather Arbour aware. Orders discontinued.

## 2016-10-24 NOTE — Telephone Encounter (Signed)
Please find out if the patient is currently taking Lyrica and what the current dose is. It does not appear that this is on her medication list. Thanks.

## 2016-10-24 NOTE — Telephone Encounter (Signed)
No answer or voice mail

## 2016-10-24 NOTE — ED Notes (Signed)
Pt resting on stretcher with family at the bedside. Pt states she is feeling a little better. Provided for safety and comfort.

## 2016-10-24 NOTE — ED Provider Notes (Signed)
-----------------------------------------   2:23 AM on 10/24/2016 -----------------------------------------  Patient was evaluated by surgery Dr. Hampton Abbot a in the emergency department. Repeat lactate normal. Improved after bupivacaine injection at incision site by surgery. She declines enema here and would prefer to do an enema at home. Will send home with fleets mineral enema as well as prescription for lactulose. Patient has a follow-up in 2 days with general surgery. Strict return precautions given. Patient verbalizes understanding and agrees with plan of care.   Paulette Blanch, MD 10/24/16 [phone]

## 2016-10-24 NOTE — Telephone Encounter (Signed)
Spoke with patients husband and patient has taken Lyrica in the past. She has an appointment with pain clinic in 2 weeks. I offered an appointment with Dr. Caryl Bis. They will call back to schedule appointment if she wants to be seen.

## 2016-10-24 NOTE — Telephone Encounter (Signed)
Pt requested a return call, pt currently has not taken Lyrica, due to Dr. Caryl Bis not authorizing Rx Contact [phone]

## 2016-10-24 NOTE — Telephone Encounter (Signed)
Please advise 

## 2016-10-24 NOTE — Telephone Encounter (Signed)
Noted  

## 2016-10-24 NOTE — Discharge Instructions (Signed)
1. You may use fleets enema at home. 2. Take laxative as prescribed (lactulose). 3. Return to the ER for worsening symptoms, persistent vomiting, difficulty breathing or other concerns.

## 2016-10-24 NOTE — ED Notes (Addendum)
Dr.   at the bedside to inject bupivacaine in pt incisions for pt comfort. Asked not to give enema until medication administered.

## 2016-10-24 NOTE — Consult Note (Signed)
Date of Consultation:  10/24/2016  Requesting Physician:  Rudene Re, MD  Reason for Consultation:  Abdominal pain  History of Present Illness: Maria Hamilton is a 65 y.o. female s/p laparoscopic cholecystectomy on 10/31 with Dr. Pat Patrick who presents today with significant right upper quadrant abdominal pain and constipation.  She was discharged to home on 11/3 with Vicodin and Tramadol but she does not tolerate narcotics well and reports having nausea/vomiting with them and some other narcotics do not work for her.  Otherwise denies having any nausea, vomiting, fevers, chills, chest pain, or shortness of breath.  Denies any other areas of abdominal pain.  She also reports constipation and that she has not had a bowel movement since well before her surgery and admission.  She tried MiraLax for two days at home and just started having flatus today but no bowel movement yet.  In the Emergency Room, she underwent workup including laboratory studies and a CT scan. Her LFTs continue to improve compared to around the time of surgery. Her white blood cell count is within normal limits but she had a elevated lactic acid of 2.8. Her CT scan did not reveal any fluid collections or abscess formation and no small bowel obstruction. However her colon had significant stool burden throughout.   Past Medical History: Past Medical History:  Diagnosis Date  . Anxiety   . Chronic hip pain   . Chronic pain syndrome   . Depression   . Edema   . Fatigue   . Fibromyalgia   . GERD (gastroesophageal reflux disease)   . Headache   . Hypertension   . Insomnia   . Low back pain   . Vitamin D deficiency      Past Surgical History: Past Surgical History:  Procedure Laterality Date  . ABDOMINAL HYSTERECTOMY    . BACK SURGERY    . BREAST SURGERY    . CHOLECYSTECTOMY N/A 10/16/2016   Procedure: LAPAROSCOPIC CHOLECYSTECTOMY WITH INTRAOPERATIVE CHOLANGIOGRAM;  Surgeon: Dia Crawford III, MD;  Location: [facility] ORS;   Service: General;  Laterality: N/A;  . cyst removal from wrist    . ECTOPIC PREGNANCY SURGERY    . HIP SURGERY     x4    Home Medications: Prior to Admission medications   Medication Sig Start Date End Date Taking? Authorizing Provider  amLODipine (NORVASC) 5 MG tablet Take 1 tablet (5 mg total) by mouth daily. 05/29/16  Yes Leone Haven, MD  Calcium Carbonate-Vit D-Min (CALCIUM 1200) 1200-1000 MG-UNIT CHEW Chew 2 tablets by mouth daily.   Yes Historical Provider, MD  diazepam (VALIUM) 2 MG tablet Take 1 tablet (2 mg total) by mouth every 8 (eight) hours as needed. Patient taking differently: Take 2 mg by mouth every 8 (eight) hours as needed for anxiety.  10/04/16  Yes Cari B Triplett, FNP  escitalopram (LEXAPRO) 20 MG tablet Take 1 tablet (20 mg total) by mouth daily. 05/29/16  Yes Leone Haven, MD  hydrochlorothiazide (HYDRODIURIL) 25 MG tablet Take 1 tablet (25 mg total) by mouth daily. 05/29/16  Yes Leone Haven, MD  traMADol (ULTRAM) 50 MG tablet Take 2 tablets (100 mg total) by mouth every 6 (six) hours as needed for moderate pain. 10/19/16  Yes Dia Crawford III, MD  lidocaine (LIDODERM) 5 % Place 1 patch onto the skin every 12 (twelve) hours. Remove & Discard patch within 12 hours or as directed by MD Patient not taking: Reported on 10/23/2016 10/11/16 10/11/17  Earleen Newport, MD  ondansetron (ZOFRAN) 4 MG tablet Take 1 tablet (4 mg total) by mouth every 8 (eight) hours as needed for nausea or vomiting. Patient not taking: Reported on 10/23/2016 10/04/16   Victorino Dike, FNP  potassium chloride SA (K-DUR,KLOR-CON) 20 MEQ tablet Take 2 tablets (40 mEq total) by mouth 2 (two) times daily. Patient not taking: Reported on 10/23/2016 09/26/16   Leone Haven, MD    Allergies: Allergies  Allergen Reactions  . Ace Inhibitors Swelling    Angioedema Angioedema  . Ciprofloxacin Itching and Rash  . Morphine And Related Itching  . Penicillin G Benzathine   . Tape Rash     Social History:  reports that she has never smoked. She has never used smokeless tobacco. She reports that she does not drink alcohol or use drugs.   Family History: Family History  Problem Relation Age of Onset  . Heart disease Mother   . Cancer Father   . Alcohol abuse Father     Review of Systems: Review of Systems  Constitutional: Negative for chills and fever.  HENT: Negative for hearing loss.   Eyes: Negative for blurred vision.  Respiratory: Negative for cough.   Cardiovascular: Negative for chest pain.  Gastrointestinal: Positive for abdominal pain and constipation. Negative for blood in stool, diarrhea, heartburn, nausea and vomiting.  Genitourinary: Negative for dysuria and hematuria.  Musculoskeletal: Negative for myalgias.  Skin: Negative for rash.  Neurological: Negative for dizziness.  Psychiatric/Behavioral: Negative for depression.  All other systems reviewed and are negative.   Physical Exam BP (!) 153/89   Pulse 75   Temp 98.1 F (36.7 C) (Oral)   Resp 18   Ht 6\' 1"  (1.854 m)   Wt 104.3 kg (230 lb)   SpO2 100%   BMI 30.34 kg/m  CONSTITUTIONAL: No acute distress HEENT:  Normocephalic, atraumatic, extraocular motion intact. NECK: Trachea is midline, and there is no jugular venous distension.  LYMPH NODES:  Lymph nodes in the neck are not enlarged. RESPIRATORY:  Lungs are clear, and breath sounds are equal bilaterally. Normal respiratory effort without pathologic use of accessory muscles. CARDIOVASCULAR: Heart is regular without murmurs, gallops, or rubs. GI: The abdomen is soft, non-distended, tender to palpation over 4 laparoscopic incisions, particularly the two on the right side.  There is no significant tenderness outside of the incision sites.  Nylon sutures are still in place over each incision with no cellulitis, erythema, or purulent drainage. MUSCULOSKELETAL:  Normal muscle strength and tone in all four extremities.  No peripheral edema or  cyanosis. SKIN: Skin turgor is normal. There are no pathologic skin lesions.  NEUROLOGIC:  Motor and sensation is grossly normal.  Cranial nerves are grossly intact. PSYCH:  Alert and oriented to person, place and time. Affect is normal.  Laboratory Analysis: Results for orders placed or performed during the hospital encounter of 10/23/16 (from the past 24 hour(s))  CBC with Differential/Platelet     Status: None   Collection Time: 10/23/16  6:22 PM  Result Value Ref Range   WBC 7.9 3.6 - 11.0 K/uL   RBC 4.65 3.80 - 5.20 MIL/uL   Hemoglobin 12.6 12.0 - 16.0 g/dL   HCT 38.7 35.0 - 47.0 %   MCV 83.3 80.0 - 100.0 fL   MCH 27.1 26.0 - 34.0 pg   MCHC 32.6 32.0 - 36.0 g/dL   RDW 14.4 11.5 - 14.5 %   Platelets 382 150 - 440 K/uL   Neutrophils Relative % 65 %  Neutro Abs 5.2 1.4 - 6.5 K/uL   Lymphocytes Relative 20 %   Lymphs Abs 1.6 1.0 - 3.6 K/uL   Monocytes Relative 11 %   Monocytes Absolute 0.8 0.2 - 0.9 K/uL   Eosinophils Relative 3 %   Eosinophils Absolute 0.2 0 - 0.7 K/uL   Basophils Relative 1 %   Basophils Absolute 0.1 0 - 0.1 K/uL  Lactic acid, plasma     Status: Abnormal   Collection Time: 10/23/16  6:22 PM  Result Value Ref Range   Lactic Acid, Venous 2.8 (HH) 0.5 - 1.9 mmol/L  Comprehensive metabolic panel     Status: Abnormal   Collection Time: 10/23/16  6:56 PM  Result Value Ref Range   Sodium 136 135 - 145 mmol/L   Potassium 3.3 (L) 3.5 - 5.1 mmol/L   Chloride 102 101 - 111 mmol/L   CO2 25 22 - 32 mmol/L   Glucose, Bld 116 (H) 65 - 99 mg/dL   BUN 8 6 - 20 mg/dL   Creatinine, Ser 0.86 0.44 - 1.00 mg/dL   Calcium 9.3 8.9 - 10.3 mg/dL   Total Protein 7.7 6.5 - 8.1 g/dL   Albumin 3.6 3.5 - 5.0 g/dL   AST 124 (H) 15 - 41 U/L   ALT 254 (H) 14 - 54 U/L   Alkaline Phosphatase 124 38 - 126 U/L   Total Bilirubin 0.5 0.3 - 1.2 mg/dL   GFR calc non Af Amer >60 >60 mL/min   GFR calc Af Amer >60 >60 mL/min   Anion gap 9 5 - 15  Lipase, blood     Status: None    Collection Time: 10/23/16  6:56 PM  Result Value Ref Range   Lipase 21 11 - 51 U/L  Urinalysis complete, with microscopic ([facility] only)     Status: Abnormal   Collection Time: 10/23/16 10:32 PM  Result Value Ref Range   Color, Urine YELLOW (A) YELLOW   APPearance CLEAR (A) CLEAR   Glucose, UA NEGATIVE NEGATIVE mg/dL   Bilirubin Urine NEGATIVE NEGATIVE   Ketones, ur NEGATIVE NEGATIVE mg/dL   Specific Gravity, Urine 1.011 1.005 - 1.030   Hgb urine dipstick NEGATIVE NEGATIVE   pH 6.0 5.0 - 8.0   Protein, ur NEGATIVE NEGATIVE mg/dL   Nitrite NEGATIVE NEGATIVE   Leukocytes, UA NEGATIVE NEGATIVE   RBC / HPF 0-5 0 - 5 RBC/hpf   WBC, UA 0-5 0 - 5 WBC/hpf   Bacteria, UA NONE SEEN NONE SEEN   Squamous Epithelial / LPF 6-30 (A) NONE SEEN  Lactic acid, plasma     Status: None   Collection Time: 10/24/16 12:39 AM  Result Value Ref Range   Lactic Acid, Venous 1.0 0.5 - 1.9 mmol/L    Imaging: Ct Abdomen Pelvis W Contrast  Result Date: 10/23/2016 CLINICAL DATA:  Postoperative gallbladder removal last Tuesday. Discharged on Friday but now demonstrates slowly increasing pain over the weekend with severe pain and nausea today. Right lower quadrant pain. No bowel movement in 3 weeks. EXAM: CT ABDOMEN AND PELVIS WITH CONTRAST TECHNIQUE: Multidetector CT imaging of the abdomen and pelvis was performed using the standard protocol following bolus administration of intravenous contrast. CONTRAST:  162mL ISOVUE-300 IOPAMIDOL (ISOVUE-300) INJECTION 61% COMPARISON:  03/03/2010 FINDINGS: Lower chest: Mild dependent changes in the lung bases. Small esophageal hiatal hernia. Hepatobiliary: Surgical absence of the gallbladder. No bile duct dilatation. Mild stranding in the gallbladder fossa consistent with postoperative change. No significant fluid collection. Diffuse fatty infiltration  of the liver. Pancreas: Fatty infiltration of the pancreas. Spleen: Normal in size without focal abnormality. Adrenals/Urinary Tract:  No adrenal gland nodules. Cyst in the upper pole of the right kidney. No hydronephrosis or hydroureter. Nephrograms are symmetrical. Bladder wall is not thickened. Stomach/Bowel: Stomach, small bowel, and colon are not abnormally distended. Contrast material flows through to the colon without evidence of bowel obstruction. Colon is diffusely stool-filled. No wall thickening or inflammatory changes identified. Appendix is normal. Vascular/Lymphatic: Aortic atherosclerosis. No enlarged abdominal or pelvic lymph nodes. Reproductive: Status post hysterectomy. No adnexal masses. Other: Focal areas of nodular infiltration demonstrated in the subcutaneous fat of the anterior abdominal wall. This is likely a combination of postoperative changes and injection sites. Abdominal wall musculature appears intact. No free air in the abdomen. Small amount of free fluid in the pelvis has increased density, likely indicating a small amount of bloody fluid. This is probably reflecting postoperative changes. Musculoskeletal: Degenerative changes in the spine. Postoperative changes of the lower lumbar spine. Bilateral hip arthroplasties. Streak artifact from surgical hardware limits visualization in some areas. IMPRESSION: Surgical absence of the gallbladder with mild stranding in the gallbladder fossa, changes in the anterior abdominal wall, and small amount of hemorrhagic fluid in the pelvis, likely representing postoperative changes. No discrete collection or significant abdominal fluid demonstrated. Bile ducts are not dilated. No evidence of bowel obstruction. Stool-filled colon. Electronically Signed   By: Lucienne Capers M.D.   On: 10/23/2016 22:42   Dg Abd 2 Views  Result Date: 10/23/2016 CLINICAL DATA:  Periumbilical pain for 4 days. No bowel movement for 3 weeks. EXAM: ABDOMEN - 2 VIEW COMPARISON:  Lumbar spine radiographs 07/20/2016. FINDINGS: There is no evidence of intraperitoneal free air. Gas is present in nondilated  loops of small and large bowel to the level of the rectum without evidence of obstruction. A small amount of stool is present in the colon. Small calcifications in the pelvis likely represent phleboliths. Prior lumbosacral fusion and bilateral total hip arthroplasties are noted. Right upper quadrant abdominal clips. Clear lung bases. IMPRESSION: Nonobstructed bowel gas pattern. Electronically Signed   By: Logan Bores M.D.   On: 10/23/2016 19:28    Assessment and Plan: This is a 65 y.o. female s/p laparoscopic cholecystectomy on 10/31 with Dr. Pat Patrick, who presents with incisional pain and constipation.  I personally reviewed all of the patient's laboratory and imaging studies and discussed them with the patient.  Her LFTs have been improving and her WBC is normal.  She had mild lactic acidosis of 2.8 which resolved to 1.0 after IV hydration.  CT scan only shows post-operative changes without any evidence of biloma or abscess.  The sutures have been removed and this improved her incisional pain.  20 ml of 0.5% bupivacaine were infiltrated over the two right lateral incisions with resolution of the patient's pain.  Dry gauze and tape dressing was applied over the two right lateral incisions.  Soap suds enema was ordered for the patient but she prefers to go home and do an enema at home instead.  After discussing with Dr. Beather Arbour in the Emergency Room, the patient will be discharged to home with Fleet Enema.  I have instructed the patient to continue using MiraLax once daily until she has regular bowel movements.  She has an appointment in the [facility] office on 11/9 at 10:30 am for post-op follow-up.   Melvyn Neth, [place]

## 2016-10-25 ENCOUNTER — Encounter: Payer: Self-pay | Admitting: Surgery

## 2016-10-25 ENCOUNTER — Other Ambulatory Visit: Payer: Self-pay

## 2016-10-25 ENCOUNTER — Ambulatory Visit (INDEPENDENT_AMBULATORY_CARE_PROVIDER_SITE_OTHER): Payer: Medicare HMO | Admitting: Surgery

## 2016-10-25 ENCOUNTER — Other Ambulatory Visit
Admission: RE | Admit: 2016-10-25 | Discharge: 2016-10-25 | Disposition: A | Discharge status: Home / Self Care | Payer: Medicare HMO | Source: Ambulatory Visit | Attending: Surgery | Admitting: Surgery

## 2016-10-25 ENCOUNTER — Telehealth: Payer: Self-pay

## 2016-10-25 VITALS — BP 143/91 | HR 79 | Temp 98.3°F | Ht 73.0 in | Wt 217.0 lb

## 2016-10-25 DIAGNOSIS — R109 Unspecified abdominal pain: Secondary | ICD-10-CM | POA: Insufficient documentation

## 2016-10-25 DIAGNOSIS — K851 Biliary acute pancreatitis without necrosis or infection: Secondary | ICD-10-CM

## 2016-10-25 DIAGNOSIS — K810 Acute cholecystitis: Secondary | ICD-10-CM

## 2016-10-25 LAB — COMPREHENSIVE METABOLIC PANEL
ALT: 169 U/L — ABNORMAL HIGH (ref 14–54)
ANION GAP: 8 (ref 5–15)
AST: 69 U/L — ABNORMAL HIGH (ref 15–41)
Albumin: 3.7 g/dL (ref 3.5–5.0)
Alkaline Phosphatase: 104 U/L (ref 38–126)
BILIRUBIN TOTAL: 0.4 mg/dL (ref 0.3–1.2)
BUN: <5 mg/dL — ABNORMAL LOW (ref 6–20)
CO2: 23 mmol/L (ref 22–32)
Calcium: 8.9 mg/dL (ref 8.9–10.3)
Chloride: 105 mmol/L (ref 101–111)
Creatinine, Ser: 0.86 mg/dL (ref 0.44–1.00)
GFR calc Af Amer: >60 mL/min (ref >60)
Glucose, Bld: 108 mg/dL — ABNORMAL HIGH (ref 65–99)
POTASSIUM: 3.5 mmol/L (ref 3.5–5.1)
Sodium: 136 mmol/L (ref 135–145)
TOTAL PROTEIN: 7.2 g/dL (ref 6.5–8.1)

## 2016-10-25 LAB — CBC WITH DIFFERENTIAL/PLATELET
BASOS PCT: 1 %
Basophils Absolute: 0.1 10*3/uL (ref 0–0.1)
Eosinophils Absolute: 0.2 10*3/uL (ref 0–0.7)
Eosinophils Relative: 4 %
HEMATOCRIT: 35 % (ref 35.0–47.0)
Hemoglobin: 11.4 g/dL — ABNORMAL LOW (ref 12.0–16.0)
LYMPHS PCT: 25 %
Lymphs Abs: 1.7 10*3/uL (ref 1.0–3.6)
MCH: 27.2 pg (ref 26.0–34.0)
MCHC: 32.5 g/dL (ref 32.0–36.0)
MCV: 83.7 fL (ref 80.0–100.0)
MONO ABS: 0.8 10*3/uL (ref 0.2–0.9)
MONOS PCT: 11 %
NEUTROS ABS: 4.1 10*3/uL (ref 1.4–6.5)
Neutrophils Relative %: 59 %
Platelets: 347 10*3/uL (ref 150–440)
RBC: 4.18 MIL/uL (ref 3.80–5.20)
RDW: 14.6 % — AB (ref 11.5–14.5)
WBC: 7 10*3/uL (ref 3.6–11.0)

## 2016-10-25 LAB — LIPASE, BLOOD: LIPASE: 19 U/L (ref 11–51)

## 2016-10-25 MED ORDER — POLYETHYLENE GLYCOL 3350 17 G PO PACK: 17.0000 g | PACK | Freq: Every day | ORAL | 0 refills | Status: DC

## 2016-10-25 MED ORDER — TRAMADOL HCL 50 MG PO TABS: 50.0000 mg | ORAL_TABLET | Freq: Four times a day (QID) | ORAL | 0 refills | Status: DC | PRN

## 2016-10-25 NOTE — Progress Notes (Signed)
Outpatient postop visit  10/25/2016  Maria Hamilton is an 65 y.o. female.    Procedure: Laparoscopic cholecystectomy with cholangiography  CC: Abdominal pain  HPI: This a patient status post laparoscopic cholecystectomy with cholangiography for biliary pancreatitis this performed by Dr. Pat Patrick last week. She was in the ED 2 days ago with constipation and given lactulose and fleets enema. Patient has not had a bowel movement in 2-3 weeks. She did have a small bowel movement yesterday with the lactulose but has not tried the fleets enema. She has ongoing minimal abdominal pain. She has no fevers or chills no jaundice or acholic stools no back pain. She asks for a higher dose of pain medication  Medications reviewed.    Physical Exam:  BP (!) 143/91   Pulse 79   Temp 98.3 F (36.8 C) (Oral)   Ht 6\' 1"  (1.854 m)   Wt 217 lb (98.4 kg)   BMI 28.63 kg/m     PE: Uses a walker. Accompanied by her family member. She appears in no acute distress no icterus no jaundice abdomen is soft and nontender wounds are clean without erythema or drainage Calves are nontender    Assessment/Plan:  LFTs were elevated in the emergency room 2 days ago and while they are considerably less than they were in the hospital I would recommend repeating nose repeating her lipase level. I believe that most of her symptoms are related to her considerable constipation and discussed with her alternatives and the use of MiraLAX. I would like to see her use some fleets enemas which she has not used yet even though they were given to her by the emergency room. She will follow-up next week  Florene Glen, MD, FACS

## 2016-10-25 NOTE — Patient Instructions (Addendum)
Please go downstairs, first floor to get your labs drawn.  Please pick up your miralax prescription from you pharmacy and buy some over the counter Fleets Enema.  Please refer below to see your follow up appointment

## 2016-10-25 NOTE — Telephone Encounter (Signed)
Called patient to let her know that her labs were normal and that we will see her next week. Patient understood and had no further questions.

## 2016-10-26 ENCOUNTER — Telehealth: Payer: Self-pay

## 2016-10-26 NOTE — Telephone Encounter (Signed)
Patient called to state she has not had a bowel movement since 10/08/16 and she had a Cholecystectomy with intraoperative cholangiogram on 10/16/16.  She is having some pain and seems to think she is constipated. She has taken a dose of Miralax this morning and had no relief.  She was instructed to take another dose of Miralax six hours from initial dose and to increase water intake to 72 ounces daily. If no results she is to take 2 dulcolax tablets. She will call office if no results.  Patient reminded of follow up appointment next week. Patient verbalized understanding.

## 2016-11-01 ENCOUNTER — Encounter: Payer: Medicare HMO | Admitting: Surgery

## 2016-11-12 ENCOUNTER — Emergency Department
Admission: EM | Admit: 2016-11-12 | Discharge: 2016-11-12 | Disposition: A | Discharge status: Home / Self Care | Payer: Medicare HMO | Attending: Student in an Organized Health Care Education/Training Program | Admitting: Student in an Organized Health Care Education/Training Program

## 2016-11-12 ENCOUNTER — Encounter: Payer: Self-pay | Admitting: *Deleted

## 2016-11-12 ENCOUNTER — Other Ambulatory Visit: Payer: Self-pay

## 2016-11-12 ENCOUNTER — Emergency Department: Payer: Medicare HMO

## 2016-11-12 ENCOUNTER — Telehealth: Payer: Self-pay | Admitting: Surgery

## 2016-11-12 DIAGNOSIS — G8929 Other chronic pain: Secondary | ICD-10-CM | POA: Insufficient documentation

## 2016-11-12 DIAGNOSIS — I10 Essential (primary) hypertension: Secondary | ICD-10-CM | POA: Diagnosis not present

## 2016-11-12 DIAGNOSIS — Z79899 Other long term (current) drug therapy: Secondary | ICD-10-CM | POA: Diagnosis not present

## 2016-11-12 DIAGNOSIS — M79605 Pain in left leg: Secondary | ICD-10-CM | POA: Diagnosis present

## 2016-11-12 MED ORDER — TRAMADOL HCL 50 MG PO TABS: 50.0000 mg | ORAL_TABLET | Freq: Four times a day (QID) | ORAL | 0 refills | Status: DC | PRN

## 2016-11-12 MED ORDER — TRAMADOL HCL 50 MG PO TABS
50.0000 mg | ORAL_TABLET | Freq: Once | ORAL | Status: AC
  Administered 2016-11-12: 50 mg via ORAL
  Filled 2016-11-12: qty 1

## 2016-11-12 NOTE — Telephone Encounter (Signed)
Called patient back and asked how I could help her. She stated that she continued to have abdominal discomfort and that it was distended. Patient denied having nausea, vomiting and fever. I asked patient if she continued to be constipated and stated that she was. I asked patient to start doing her enemas and taking Miralax 17 G daily as told by Dr. Burt Knack. She stated that she would try. In addition, patient asked if we could write her a prescription for Tramadol for her leg. I told her that Dr. Burt Knack will only treat her abdominal pain but not her leg. I told patient to please call her primary care doctor and let him/her know about it. She stated that she would. Patient did not have any additional questions.

## 2016-11-12 NOTE — ED Triage Notes (Addendum)
Pt to triage via wheelchair.  Pt has left lower leg for 1 month.  Pt has appt next week with pain clinic.  No swelling noted to leg.  States painful to ambulate.

## 2016-11-12 NOTE — Discharge Instructions (Signed)
Tramadol 1 every 6 hours as needed for pain. This is a medication that Dr. Burt Knack had to taking. Keep your Appointment with the pain doctor for evaluation and treatment of leg pain. Call your primary care doctor if any continued pain medication as needed.

## 2016-11-12 NOTE — ED Notes (Signed)
Discharge instructions reviewed with patient. Questions fielded by this RN. Patient verbalizes understanding of instructions. Patient discharged home in stable condition per [facility] . No acute distress noted at time of discharge.

## 2016-11-12 NOTE — ED Provider Notes (Signed)
[facility] Emergency Department Provider Note  ____________________________________________   First MD Initiated Contact with Patient 11/12/16 2053     (approximate)  I have reviewed the triage vital signs and the nursing notes.   HISTORY  Chief Complaint Leg Pain   HPI Maria Hamilton is a 65 y.o. female is here complaining of chronic leg pain for 1 month. Patient states that she has had left lower pain without a history of injury. She states that she has been to her primary care doctor and also is scheduled for an appointment with the pain clinic next week. Patient has taken tramadol in the past along with. His other medications without complete relief of her pain. Patient denies any recent injury. She states that the pain pattern has not changed any tonight only that her tolerance is growing thin.  Current she rates her pain as 9 out of 10.   Past Medical History:  Diagnosis Date  . Anxiety   . Chronic hip pain   . Chronic pain syndrome   . Depression   . Edema   . Fatigue   . Fibromyalgia   . GERD (gastroesophageal reflux disease)   . Headache   . Hypertension   . Insomnia   . Low back pain   . Vitamin D deficiency     Patient Active Problem List   Diagnosis Date Noted  . Right upper quadrant abdominal pain   . Constipation   . Acute pancreatitis 10/14/2016  . Cholecystitis   . Altered mental status 10/12/2016  . Hypokalemia 09/27/2016  . Long term current use of opiate analgesic 09/24/2016  . Long term prescription opiate use 09/24/2016  . Opiate use 09/24/2016  . Encounter for therapeutic drug level monitoring 09/24/2016  . Encounter for pain management planning 09/24/2016  . Rheumatoid arthritis, multiple sites (positive RF) 09/24/2016  . Chronic lower extremity pain (Location of Secondary source of pain) (Left) 09/24/2016  . Lumbar facet arthropathy (Bilateral) 09/24/2016  . Failed back surgical syndrome (x 3) 09/24/2016  .  Epidural fibrosis 09/24/2016  . Neurogenic pain 09/24/2016  . Musculoskeletal pain 09/24/2016  . Chronic lumbar radicular pain (L5/S1 dermatome) (Location of Secondary source of pain) (Left) 09/24/2016  . History of total hip replacement, bilateral 09/24/2016  . Chronic pain 09/21/2016  . GERD (gastroesophageal reflux disease) 08/03/2015  . Acute anxiety 08/03/2015  . Rheumatoid factor positive 08/03/2015  . Fatigue 08/03/2015  . Vitamin D deficiency 08/03/2015  . Insomnia 08/03/2015  . Fibromyalgia 08/03/2015  . Chronic low back pain (Location of Primary Source of Pain) (Left) 08/03/2015  . Chronic pain syndrome 08/03/2015  . Depression 08/03/2015  . Hypertension 08/03/2015  . Cellulitis due to MRSA 08/03/2015  . Mechanical complication of internal joint prosthesis ([place]) 04/22/2012    Past Surgical History:  Procedure Laterality Date  . ABDOMINAL HYSTERECTOMY    . BACK SURGERY    . BREAST SURGERY    . CHOLECYSTECTOMY N/A 10/16/2016   Procedure: LAPAROSCOPIC CHOLECYSTECTOMY WITH INTRAOPERATIVE CHOLANGIOGRAM;  Surgeon: Dia Crawford III, MD;  Location: [facility] ORS;  Service: General;  Laterality: N/A;  . cyst removal from wrist    . ECTOPIC PREGNANCY SURGERY    . HIP SURGERY     x4    Prior to Admission medications   Medication Sig Start Date End Date Taking? Authorizing Provider  amLODipine (NORVASC) 5 MG tablet Take 1 tablet (5 mg total) by mouth daily. 05/29/16   Leone Haven, MD  Calcium Carbonate-Vit  D-Min (CALCIUM 1200) 1200-1000 MG-UNIT CHEW Chew 2 tablets by mouth daily.    Historical Provider, MD  escitalopram (LEXAPRO) 20 MG tablet Take 1 tablet (20 mg total) by mouth daily. 05/29/16   Leone Haven, MD  folic acid (FOLVITE) 1 MG tablet Take 1 tablet by mouth 1 day or 1 dose. 10/17/16   Historical Provider, MD  hydrochlorothiazide (HYDRODIURIL) 25 MG tablet Take 1 tablet (25 mg total) by mouth daily. 05/29/16   Leone Haven, MD  ondansetron (ZOFRAN) 4 MG tablet  Take 1 tablet (4 mg total) by mouth every 8 (eight) hours as needed for nausea or vomiting. 10/04/16   Victorino Dike, FNP  polyethylene glycol (MIRALAX / GLYCOLAX) packet Take 17 g by mouth daily. 10/25/16   Florene Glen, MD  polyethylene glycol powder (GLYCOLAX/MIRALAX) powder Take 17 g by mouth 1 day or 1 dose. 10/25/16   Historical Provider, MD  potassium chloride SA (K-DUR,KLOR-CON) 20 MEQ tablet Take 2 tablets (40 mEq total) by mouth 2 (two) times daily. 09/26/16   Leone Haven, MD  thiamine (VITAMIN B-1) 100 MG tablet Take 1 tablet by mouth 1 day or 1 dose. 10/16/16   Historical Provider, MD  traMADol (ULTRAM) 50 MG tablet Take 1 tablet (50 mg total) by mouth every 6 (six) hours as needed. 11/12/16   Johnn Hai, PA-C  vitamin B-12 (CYANOCOBALAMIN) 1000 MCG tablet Take 1 tablet by mouth 1 day or 1 dose. 10/16/16   Historical Provider, MD  Vitamin D, Ergocalciferol, (DRISDOL) 50000 units CAPS capsule Take 1 capsule by mouth 1 day or 1 dose. 09/30/16   Historical Provider, MD    Allergies Ace inhibitors; Ciprofloxacin; Morphine and related; Penicillin g benzathine; and Tape  Family History  Problem Relation Age of Onset  . Heart disease Mother   . Cancer Father   . Alcohol abuse Father     Social History Social History  Substance Use Topics  . Smoking status: Never Smoker  . Smokeless tobacco: Never Used  . Alcohol use No    Review of Systems Constitutional: No fever/chills Eyes: No visual changes. Cardiovascular: Denies chest pain. Respiratory: Denies shortness of breath. Gastrointestinal:  No nausea, no vomiting.   Musculoskeletal: Chronic left leg pain. Skin: Negative for rash. Neurological: Negative for headaches, focal weakness or numbness.  10-point ROS otherwise negative.  ____________________________________________   PHYSICAL EXAM:  VITAL SIGNS: ED Triage Vitals  Enc Vitals Group     BP 11/12/16 1935 125/85     Pulse Rate 11/12/16 1935 71      Resp 11/12/16 1935 18     Temp 11/12/16 1935 98.5 F (36.9 C)     Temp Source 11/12/16 1935 Oral     SpO2 11/12/16 1935 100 %     Weight 11/12/16 1937 150 lb (68 kg)     Height 11/12/16 1937 6\' 1"  (1.854 m)     Head Circumference --      Peak Flow --      Pain Score 11/12/16 1937 9     Pain Loc --      Pain Edu? --      Excl. in [place]? --     Constitutional: Alert and oriented. Well appearing and in no acute distress. Eyes: Conjunctivae are normal. PERRL. EOMI. Head: Atraumatic. Nose: No congestion/rhinnorhea. Neck: No stridor.   Cardiovascular: Normal rate, regular rhythm. Grossly normal heart sounds.  Good peripheral circulation. Respiratory: Normal respiratory effort.  No retractions. Lungs CTAB.  Gastrointestinal: Soft and nontender. No distention.  Musculoskeletal: Examination of the left lower leg there is no gross deformity noted and no pitting edema is present. There is no erythema, ecchymosis or abrasions noted. Pulses positive. Motor sensory function intact. Patient is able to do range of motion with her lower extremity without any assistance. In comparison to her right leg there is no obvious deformity. Neurologic:  Normal speech and language. No gross focal neurologic deficits are appreciated. No gait instability. Skin:  Skin is warm, dry and intact. No rash noted. As noted above. Psychiatric: Mood and affect are normal. Speech and behavior are normal.  ____________________________________________   LABS (all labs ordered are listed, but only abnormal results are displayed)  Labs Reviewed - No data to display   RADIOLOGY  Ultrasound left leg per radiologist shows no evidence of DVT. ____________________________________________   PROCEDURES  Procedure(s) performed: None  Procedures  Critical Care performed: No  ____________________________________________   INITIAL IMPRESSION / ASSESSMENT AND PLAN / ED COURSE  Pertinent labs & imaging results that were  available during my care of the patient were reviewed by me and considered in my medical decision making (see chart for details).    Clinical Course    Patient was made aware that she does not have a DVT in her leg. She is to keep her appointment with the pain management clinic. She was given a prescription for tramadol as she has taken this in the past. She is to contact her primary care doctor for any continued pain medication.  ____________________________________________   FINAL CLINICAL IMPRESSION(S) / ED DIAGNOSES  Final diagnoses:  Chronic pain of left lower extremity      NEW MEDICATIONS STARTED DURING THIS VISIT:  Discharge Medication List as of 11/12/2016  9:11 PM       Note:  This document was prepared using Dragon voice recognition software and may include unintentional dictation errors.    Johnn Hai, PA-C 11/12/16 2319    Merlyn Lot, MD 11/12/16 [phone]

## 2016-11-12 NOTE — Telephone Encounter (Signed)
Patient needs a refill on [facility] [address]

## 2016-11-15 ENCOUNTER — Telehealth: Payer: Self-pay

## 2016-11-15 ENCOUNTER — Encounter: Payer: Self-pay | Admitting: Surgery

## 2016-11-15 ENCOUNTER — Other Ambulatory Visit
Admission: RE | Admit: 2016-11-15 | Discharge: 2016-11-15 | Disposition: A | Discharge status: Home / Self Care | Payer: Medicare HMO | Source: Ambulatory Visit | Attending: Surgery | Admitting: Surgery

## 2016-11-15 ENCOUNTER — Telehealth: Payer: Self-pay | Admitting: Family Medicine

## 2016-11-15 ENCOUNTER — Ambulatory Visit (INDEPENDENT_AMBULATORY_CARE_PROVIDER_SITE_OTHER): Payer: Medicare HMO | Admitting: Surgery

## 2016-11-15 VITALS — BP 97/64 | HR 80 | Temp 97.5°F | Ht 73.0 in | Wt 213.0 lb

## 2016-11-15 DIAGNOSIS — K851 Biliary acute pancreatitis without necrosis or infection: Secondary | ICD-10-CM

## 2016-11-15 LAB — COMPREHENSIVE METABOLIC PANEL
ALBUMIN: 3.7 g/dL (ref 3.5–5.0)
ALT: 28 U/L (ref 14–54)
ANION GAP: 9 (ref 5–15)
AST: 30 U/L (ref 15–41)
Alkaline Phosphatase: 78 U/L (ref 38–126)
BUN: 9 mg/dL (ref 6–20)
CHLORIDE: 99 mmol/L — AB (ref 101–111)
CO2: 28 mmol/L (ref 22–32)
Calcium: 9.2 mg/dL (ref 8.9–10.3)
Creatinine, Ser: 0.83 mg/dL (ref 0.44–1.00)
GFR calc Af Amer: >60 mL/min (ref >60)
GLUCOSE: 93 mg/dL (ref 65–99)
POTASSIUM: 2.5 mmol/L — AB (ref 3.5–5.1)
Sodium: 136 mmol/L (ref 135–145)
Total Bilirubin: 0.4 mg/dL (ref 0.3–1.2)
Total Protein: 7.4 g/dL (ref 6.5–8.1)

## 2016-11-15 NOTE — Telephone Encounter (Signed)
LVM for patient to call office. Please let patient know the below appointment and lab results.    Spoke with Maria Hamilton at Dr. Nevada Crane office.  Appointment was given 01/17/17 at  8:00 am. However she is sending a note back to see if Dr.Sonnenburg can see patient sooner due to Potassium being low.   Appointment reminder mailed today for the above appointment.  Discussed Potassium results with Dr.Cooper and he stated her PCP should treat her.

## 2016-11-15 NOTE — Addendum Note (Signed)
Addended by: Celene Kras on: 11/15/2016 10:38 AM   Modules accepted: Orders

## 2016-11-15 NOTE — Patient Instructions (Signed)
We need for you to use Fleets enema twice today 6 hours apart. Please hold each enema in the rectum for at least 10 minutes. Please drink entire bottle of Magnesium Citrate.  Please go to the lab today at the [facility]. We will call you with those results. If your labs are normal we will not need to see you back. Please call our office if you have any questions.

## 2016-11-15 NOTE — Telephone Encounter (Signed)
Spoke with Estill Bamberg at this time and appointment was made for 11/26/16 @ 11:15 am.   Left message on patients phone regarding the above appointment. Mailed an appointment reminder as well. Patient was asked to call office and confirm appointment.

## 2016-11-15 NOTE — Telephone Encounter (Signed)
Burnett Kanaris from [facility] lab called at this time with Critical Potassium of 2.5.  Freda Munro (Nurse working with Dr. Burt Knack today) was notified at this time and she will contact Dr. Burt Knack for further orders.

## 2016-11-15 NOTE — Telephone Encounter (Signed)
Please see other phone note

## 2016-11-15 NOTE — Progress Notes (Signed)
Outpatient postop visit  11/15/2016  Maria Hamilton is an 65 y.o. female.    Procedure: lap chole [place])  CC: Postop from biliary pancreatitis/laparoscopic cholecystectomy  HPI: This patient status post laparoscopic cholecystectomy for biliary pancreatitis. She has had persistently elevated liver function tests most recently on November 9. Patient has not had a bowel movement in 3 weeks. We discussed at her last visit maneuvers to obtain good bowel movements and clean out her colon. She has tried Dulcolax MiraLAX (low-dose) and an enema but she did not retain the enema long enough. She is only done at once.  Medications reviewed.    Physical Exam:  There were no vitals taken for this visit.    PE: Abdomen is not distended not tympanitic and nontender wounds are clean no erythema or drainage on tender calves no peripheral edema    Assessment/Plan:  Constipation and elevated liver function tests. Will check a CMP today to assure that her LFTs are coming back towards normal. I reviewed for her the need for bowel movements and maneuvers to aid in that. I like her to try one dose of magnesium citrate 8 ounces one time only. And I discussed with her using 2 fleets enemas periodically to try to clean out and the need for retaining those enemas. She'll follow-up with [place] on an as-needed basis  Florene Glen, MD, FACS

## 2016-11-15 NOTE — Telephone Encounter (Signed)
Spoke with Dr.Cooper at 11:50 am regarding critical potassium level. He stated he would be returning to the office shortly and will let me know how to proceed.

## 2016-11-15 NOTE — Telephone Encounter (Signed)
She can be put in at 11:15 AM on 11/26/16

## 2016-11-15 NOTE — Telephone Encounter (Signed)
Maria Hamilton from [facility] called needing to make an appt for pt. She has been seeing Dr. Burt Knack for constipation and was advised to come and see Dr. Caryl Bis. The next available appt isn't until 2/1 @ 8am. Please advise, thank you!  Call [place] @ [phone]

## 2016-11-20 ENCOUNTER — Ambulatory Visit: Payer: Medicare HMO | Attending: Pain Medicine | Admitting: Pain Medicine

## 2016-11-20 ENCOUNTER — Encounter: Payer: Self-pay | Admitting: Pain Medicine

## 2016-11-20 ENCOUNTER — Other Ambulatory Visit: Payer: Self-pay | Admitting: Family Medicine

## 2016-11-20 VITALS — BP 113/93 | HR 79 | Temp 98.4°F | Resp 20 | Ht 73.0 in | Wt 213.0 lb

## 2016-11-20 DIAGNOSIS — Z96643 Presence of artificial hip joint, bilateral: Secondary | ICD-10-CM | POA: Diagnosis not present

## 2016-11-20 DIAGNOSIS — M5442 Lumbago with sciatica, left side: Secondary | ICD-10-CM

## 2016-11-20 DIAGNOSIS — M069 Rheumatoid arthritis, unspecified: Secondary | ICD-10-CM | POA: Insufficient documentation

## 2016-11-20 DIAGNOSIS — Z79891 Long term (current) use of opiate analgesic: Secondary | ICD-10-CM | POA: Insufficient documentation

## 2016-11-20 DIAGNOSIS — Z9049 Acquired absence of other specified parts of digestive tract: Secondary | ICD-10-CM | POA: Insufficient documentation

## 2016-11-20 DIAGNOSIS — M545 Low back pain: Secondary | ICD-10-CM | POA: Diagnosis not present

## 2016-11-20 DIAGNOSIS — M79605 Pain in left leg: Secondary | ICD-10-CM | POA: Insufficient documentation

## 2016-11-20 DIAGNOSIS — G8929 Other chronic pain: Secondary | ICD-10-CM

## 2016-11-20 DIAGNOSIS — F419 Anxiety disorder, unspecified: Secondary | ICD-10-CM | POA: Diagnosis not present

## 2016-11-20 DIAGNOSIS — R4182 Altered mental status, unspecified: Secondary | ICD-10-CM | POA: Diagnosis not present

## 2016-11-20 DIAGNOSIS — Z888 Allergy status to other drugs, medicaments and biological substances status: Secondary | ICD-10-CM | POA: Insufficient documentation

## 2016-11-20 DIAGNOSIS — M961 Postlaminectomy syndrome, not elsewhere classified: Secondary | ICD-10-CM

## 2016-11-20 DIAGNOSIS — M797 Fibromyalgia: Secondary | ICD-10-CM | POA: Insufficient documentation

## 2016-11-20 DIAGNOSIS — F329 Major depressive disorder, single episode, unspecified: Secondary | ICD-10-CM | POA: Insufficient documentation

## 2016-11-20 DIAGNOSIS — K859 Acute pancreatitis without necrosis or infection, unspecified: Secondary | ICD-10-CM | POA: Diagnosis not present

## 2016-11-20 DIAGNOSIS — K819 Cholecystitis, unspecified: Secondary | ICD-10-CM | POA: Diagnosis not present

## 2016-11-20 DIAGNOSIS — Z9889 Other specified postprocedural states: Secondary | ICD-10-CM | POA: Insufficient documentation

## 2016-11-20 DIAGNOSIS — M791 Myalgia: Secondary | ICD-10-CM

## 2016-11-20 DIAGNOSIS — E876 Hypokalemia: Secondary | ICD-10-CM | POA: Insufficient documentation

## 2016-11-20 DIAGNOSIS — G4700 Insomnia, unspecified: Secondary | ICD-10-CM | POA: Insufficient documentation

## 2016-11-20 DIAGNOSIS — I10 Essential (primary) hypertension: Secondary | ICD-10-CM | POA: Diagnosis not present

## 2016-11-20 DIAGNOSIS — G894 Chronic pain syndrome: Secondary | ICD-10-CM | POA: Diagnosis not present

## 2016-11-20 DIAGNOSIS — K219 Gastro-esophageal reflux disease without esophagitis: Secondary | ICD-10-CM | POA: Diagnosis not present

## 2016-11-20 DIAGNOSIS — T402X5A Adverse effect of other opioids, initial encounter: Secondary | ICD-10-CM

## 2016-11-20 DIAGNOSIS — Z79899 Other long term (current) drug therapy: Secondary | ICD-10-CM | POA: Insufficient documentation

## 2016-11-20 DIAGNOSIS — K5903 Drug induced constipation: Secondary | ICD-10-CM

## 2016-11-20 DIAGNOSIS — E559 Vitamin D deficiency, unspecified: Secondary | ICD-10-CM | POA: Insufficient documentation

## 2016-11-20 DIAGNOSIS — M5416 Radiculopathy, lumbar region: Secondary | ICD-10-CM | POA: Diagnosis not present

## 2016-11-20 DIAGNOSIS — R5383 Other fatigue: Secondary | ICD-10-CM | POA: Diagnosis not present

## 2016-11-20 DIAGNOSIS — R1011 Right upper quadrant pain: Secondary | ICD-10-CM | POA: Insufficient documentation

## 2016-11-20 DIAGNOSIS — M7918 Myalgia, other site: Secondary | ICD-10-CM

## 2016-11-20 MED ORDER — LUBIPROSTONE 8 MCG PO CAPS: 8.0000 ug | ORAL_CAPSULE | Freq: Two times a day (BID) | ORAL | 99 refills | Status: DC

## 2016-11-20 MED ORDER — CYCLOBENZAPRINE HCL 10 MG PO TABS: 10.0000 mg | ORAL_TABLET | Freq: Three times a day (TID) | ORAL | 1 refills | Status: DC | PRN

## 2016-11-20 MED ORDER — BENEFIBER PO POWD: ORAL | 99 refills | Status: DC

## 2016-11-20 MED ORDER — TRAMADOL HCL 50 MG PO TABS: 50.0000 mg | ORAL_TABLET | Freq: Four times a day (QID) | ORAL | 0 refills | Status: DC | PRN

## 2016-11-20 NOTE — Progress Notes (Signed)
Safety precautions to be maintained throughout the outpatient stay will include: orient to surroundings, keep bed in low position, maintain call bell within reach at all times, provide assistance with transfer out of bed and ambulation.  

## 2016-11-20 NOTE — Progress Notes (Signed)
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 procedure with just enough water to swallow them. 5. If you have diabetes, make sure that you are scheduled to have your procedure done first thing in the morning, whenever possible. 6. If you have diabetes, take only half of your insulin dose and notify our nurse that you have done so as soon as you arrive at the clinic. 7. If you are diabetic, but only take blood sugar pills (oral hypoglycemic), then do not take them on the morning of your procedure.  You may take them after you have had the procedure. 8. Do not take aspirin or any aspirin-containing medications, at least eleven (11) days prior to the procedure.  They may prolong bleeding. 9. Wear loose fitting clothing that may be easy to take off and that you would not mind if it got stained with Betadine or blood. 10. Do not wear any jewelry or perfume 11. Remove any nail coloring.  It will interfere with some of our monitoring equipment. 12. If you take Metformin for your diabetes, stop it 48 hours prior to the procedure.  NOTE: Remember that this is not meant to be interpreted as a complete list of all possible complications.  Unforeseen problems may occur.  BLOOD THINNERS The following drugs contain aspirin or other products, which can cause increased bleeding during surgery and should not be taken for 2 weeks prior to and 1 week after surgery.  If you should need take something for relief of minor pain, you may take acetaminophen which is found in Tylenol,m Datril, Anacin-3 and Panadol. It is not blood thinner. The products listed below are.  Do not take any  of the products listed below in addition to any listed on your instruction sheet.  A.P.C or A.P.C with Codeine Codeine Phosphate Capsules #3 Ibuprofen Ridaura  ABC compound Congesprin Imuran rimadil  Advil Cope Indocin Robaxisal  Alka-Seltzer Effervescent Pain Reliever and Antacid Coricidin or Coricidin-D  Indomethacin Rufen  Alka-Seltzer plus Cold Medicine Cosprin Ketoprofen S-A-C Tablets  Anacin Analgesic Tablets or Capsules Coumadin Korlgesic Salflex  Anacin Extra Strength Analgesic tablets or capsules CP-2 Tablets Lanoril Salicylate  Anaprox Cuprimine Capsules Levenox Salocol  Anexsia-D Dalteparin Magan Salsalate  Anodynos Darvon compound Magnesium Salicylate Sine-off  Ansaid Dasin Capsules Magsal Sodium Salicylate  Anturane Depen Capsules Marnal Soma  APF Arthritis pain formula Dewitt's Pills Measurin Stanback  Argesic Dia-Gesic Meclofenamic Sulfinpyrazone  Arthritis Bayer Timed Release Aspirin Diclofenac Meclomen Sulindac  Arthritis pain formula Anacin Dicumarol Medipren Supac  Analgesic (Safety coated) Arthralgen Diffunasal Mefanamic Suprofen  Arthritis Strength Bufferin Dihydrocodeine Mepro Compound Suprol  Arthropan liquid Dopirydamole Methcarbomol with Aspirin Synalgos  ASA tablets/Enseals Disalcid Micrainin Tagament  Ascriptin Doan's Midol Talwin  Ascriptin A/D Dolene Mobidin Tanderil  Ascriptin Extra Strength Dolobid Moblgesic Ticlid  Ascriptin with Codeine Doloprin or Doloprin with Codeine Momentum Tolectin  Asperbuf Duoprin Mono-gesic Trendar  Aspergum Duradyne Motrin or Motrin IB Triminicin  Aspirin plain, buffered or enteric coated Durasal Myochrisine Trigesic  Aspirin Suppositories Easprin Nalfon Trillsate  Aspirin with Codeine Ecotrin Regular or Extra Strength Naprosyn Uracel  Atromid-S Efficin Naproxen Ursinus  Auranofin Capsules Elmiron Neocylate Vanquish  Axotal Emagrin Norgesic Verin  Azathioprine Empirin or Empirin with Codeine Normiflo Vitamin E  Azolid  Emprazil Nuprin Voltaren  Bayer Aspirin plain, buffered or children's or timed BC Tablets or powders Encaprin Orgaran Warfarin Sodium  Buff-a-Comp Enoxaparin Orudis Zorpin  Buff-a-Comp with Codeine Equegesic Os-Cal-Gesic   Buffaprin Excedrin plain, buffered or Extra Strength Oxalid   Bufferin Arthritis Strength Feldene Oxphenbutazone   Bufferin plain or Extra Strength Feldene Capsules Oxycodone with Aspirin   Bufferin with Codeine Fenoprofen Fenoprofen Pabalate or Pabalate-SF   Buffets II Flogesic Panagesic   Buffinol plain or Extra Strength Florinal or Florinal with Codeine Panwarfarin   Buf-Tabs Flurbiprofen Penicillamine   Butalbital Compound Four-way cold tablets Penicillin   Butazolidin Fragmin Pepto-Bismol   Carbenicillin Geminisyn Percodan   Carna Arthritis Reliever Geopen Persantine   Carprofen Gold's salt Persistin   Chloramphenicol Goody's Phenylbutazone   Chloromycetin Haltrain Piroxlcam   Clmetidine heparin Plaquenil   Cllnoril Hyco-pap Ponstel   Clofibrate Hydroxy chloroquine Propoxyphen         Before stopping any of these medications, be sure to consult the physician who ordered them.  Some, such as Coumadin (Warfarin) are ordered to prevent or treat serious conditions such as "deep thrombosis", "pumonary embolisms", and other heart problems.  The amount of time that you may need off of the medication may also vary with the medication and the reason for which you were taking it.  If you are taking any of these medications, please make sure you notify your pain physician before you undergo any procedures.Preparing for Procedure with Sedation Instructions: . Oral Intake: Do not eat or drink anything for at least 8 hours prior to your procedure. . Transportation: Public transportation is not allowed. Bring an adult driver. The driver must be physically present in our waiting room before any procedure can be started. Marland Kitchen Physical Assistance: Bring an adult capable of physically  assisting you, in the event you need help. . Blood Pressure Medicine: Take your blood pressure medicine with a sip of water the morning of the procedure. . Insulin: Take only  of your normal insulin dose. . Preventing infections: Shower with an antibacterial soap the morning of your procedure. . Build-up your immune system: Take 1000 mg of Vitamin C with every meal (3 times a day) the day prior to your procedure. . Pregnancy: If you are pregnant, call and cancel the procedure. . Sickness: If you have a cold, fever, or any active infections, call and cancel the procedure. . Arrival: You must be in the facility at least 30 minutes  prior to your scheduled procedure. . Children: Do not bring children with you. . Dress appropriately: Bring dark clothing that you would not mind if they get stained. . Valuables: Do not bring any jewelry or valuables. Procedure appointments are reserved for interventional treatments only. Marland Kitchen No Prescription Refills. . No medication changes will be discussed during procedure appointments. No disability issues will be discussed.   Notified that script for Amitiza, benefiber will be at pharmacy waiting to be picked up.  Script in hand for Tramadol x 2.

## 2016-11-20 NOTE — Patient Instructions (Addendum)
Epidural Steroid Injection An epidural steroid injection is given to relieve pain in your neck, back, or legs that is caused by the irritation or swelling of a nerve root. This procedure involves injecting a steroid and numbing medicine (anesthetic) into the epidural space. The epidural space is the space between the outer covering of your spinal cord and the bones that form your backbone (vertebra).  LET [facility] CARE PROVIDER KNOW ABOUT:   Any allergies you have.  All medicines you are taking, including vitamins, herbs, eye drops, creams, and over-the-counter medicines such as aspirin.  Previous problems you or members of your family have had with the use of anesthetics.  Any blood disorders or blood clotting disorders you have.  Previous surgeries you have had.  Medical conditions you have.  RISKS AND COMPLICATIONS Generally, this is a safe procedure. However, as with any procedure, complications can occur. Possible complications of epidural steroid injection include:  Headache.  Bleeding.  Infection.  Allergic reaction to the medicines.  Damage to your nerves. The response to this procedure depends on the underlying cause of the pain and its duration. People who have long-term (chronic) pain are less likely to benefit from epidural steroids than are those people whose pain comes on strong and suddenly.  BEFORE THE PROCEDURE   Ask your health care provider about changing or stopping your regular medicines. You may be advised to stop taking blood-thinning medicines a few days before the procedure.  You may be given medicines to reduce anxiety.  Arrange for someone to take you home after the procedure.  PROCEDURE   You will remain awake during the procedure. You may receive medicine to make you relaxed.  You will be asked to lie on your stomach.  The injection site will be cleaned.  The injection site will be numbed with a medicine (local anesthetic).  A needle  will be injected through your skin into the epidural space.  Your health care provider will use an X-ray machine to ensure that the steroid is delivered closest to the affected nerve. You may have minimal discomfort at this time.  Once the needle is in the right position, the local anesthetic and the steroid will be injected into the epidural space.  The needle will then be removed and a bandage will be applied to the injection site.  AFTER THE PROCEDURE   You may be monitored for a short time before you go home.  You may feel weakness or numbness in your arm or leg, which disappears within hours.  You may be allowed to eat, drink, and take your regular medicine.  You may have soreness at the site of the injection.   This information is not intended to replace advice given to you by your health care provider. Make sure you discuss any questions you have with your health care provider.   Document Released: 03/11/2008 Document Revised: 08/05/2013 Document Reviewed: 05/22/2013 Elsevier Interactive Patient Education 2016 [place]  What are the risk, side effects and possible complications? Generally speaking, most procedures are safe.  However, with any procedure there are risks, side effects, and the possibility of complications.  The risks and complications are dependent upon the sites that are lesioned, or the type of nerve block to be performed.  The closer the procedure is to the spine, the more serious the risks are.  Great care is taken when placing the radio frequency needles, block needles or lesioning probes, but sometimes  complications can occur. Infection: Any time there is an injection through the skin, there is a risk of infection.  This is why sterile conditions are used for these blocks. There are four possible types of infection: 1. Localized skin infection. 2. Central Nervous System Infection: This can be in the form of Meningitis,  which can be deadly. 3. Epidural Infections: This can be in the form of an epidural abscess, which can cause pressure inside of the spine, causing compression of the spinal cord with subsequent paralysis. This would require an emergency surgery to decompress, and there are no guarantees that the patient would recover from the paralysis. 4. Discitis: This is an infection of the intervertebral discs. It occurs in about 1% of discography procedures. It is difficult to treat and it may lead to surgery. Pain: the needles have to go through skin and soft tissues, will cause soreness. Damage to internal structures:  The nerves to be lesioned may be near blood vessels or other nerves which can be potentially damaged. Bleeding: Bleeding is more common if the patient is taking blood thinners such as  aspirin, Coumadin, Ticiid, Plavix, etc., or if he/she have some genetic predisposition such as hemophilia. Bleeding into the spinal canal can cause compression of the spinal  cord with subsequent paralysis.  This would require an emergency surgery to decompress and there are no guarantees that the patient would recover from the paralysis. Pneumothorax: Puncturing of a lung is a possibility, every time a needle is introduced in the area of the chest or upper back.  Pneumothorax refers to free air around the collapsed lung(s), inside of the thoracic cavity (chest cavity).  Another two possible complications related to a similar event would include: Hemothorax and Chylothorax. These are variations of the Pneumothorax, where instead of air around the collapsed lung(s), you may have blood or chyle, respectively. Spinal headaches: They may occur with any procedures in the area of the spine. Persistent CSF (Cerebro-Spinal Fluid) leakage: This is a rare problem, but may occur with prolonged intrathecal or epidural catheters either due to the formation of a fistulous track or a dural tear. Nerve damage: By working so close to the  spinal cord, there is always a possibility of nerve damage, which could be as serious as a permanent spinal cord injury with paralysis. Death: Although rare, severe deadly allergic reactions known as "Anaphylactic reaction" can occur to any of the medications used. Worsening of the symptoms: We can always make thing worse.  What are the chances of something like this happening? Chances of any of this occuring are extremely low.  By statistics, you have more of a chance of getting killed in a motor vehicle accident: while driving to the hospital than any of the above occurring .  Nevertheless, you should be aware that they are possibilities.  In general, it is similar to taking a shower.  Everybody knows that you can slip, hit your head and get killed.  Does that mean that you should not shower again?  Nevertheless always keep in mind that statistics do not mean anything if you happen to be on the wrong side of them.  Even if a procedure has a 1 (one) in a 1,000,000 (million) chance of going wrong, it you happen to be that one..Also, keep in mind that by statistics, you have more of a chance of having something go wrong when taking medications.  Who should not have this procedure? If you are on a blood thinning medication (  e.g. Coumadin, Plavix, see list of "Blood Thinners"), or if you have an active infection going on, you should not have the procedure.  If you are taking any blood thinners, please inform your physician.  Preparing for your procedure: 1. Do not eat or drink anything at least eight (8) hours prior to the procedure. 2. Bring a driver with you .  It cannot be a taxi. 3. Come accompanied by an adult that can drive you back, and that is strong enough to help you if your legs get weak or numb from the local anesthetic. 4. Take all of your medicines the morning of the procedure with just enough water to swallow them. 5. If you have diabetes, make sure that you are scheduled to have your  procedure done first thing in the morning, whenever possible. 6. If you have diabetes, take only half of your insulin dose and notify our nurse that you have done so as soon as you arrive at the clinic. 7. If you are diabetic, but only take blood sugar pills (oral hypoglycemic), then do not take them on the morning of your procedure.  You may take them after you have had the procedure. 8. Do not take aspirin or any aspirin-containing medications, at least eleven (11) days prior to the procedure.  They may prolong bleeding. 9. Wear loose fitting clothing that may be easy to take off and that you would not mind if it got stained with Betadine or blood. 10. Do not wear any jewelry or perfume 11. Remove any nail coloring.  It will interfere with some of our monitoring equipment. 12. If you take Metformin for your diabetes, stop it 48 hours prior to the procedure.  NOTE: Remember that this is not meant to be interpreted as a complete list of all possible complications.  Unforeseen problems may occur.  BLOOD THINNERS The following drugs contain aspirin or other products, which can cause increased bleeding during surgery and should not be taken for 2 weeks prior to and 1 week after surgery.  If you should need take something for relief of minor pain, you may take acetaminophen which is found in Tylenol,m Datril, Anacin-3 and Panadol. It is not blood thinner. The products listed below are.  Do not take any of the products listed below in addition to any listed on your instruction sheet.  A.P.C or A.P.C with Codeine Codeine Phosphate Capsules #3 Ibuprofen Ridaura  ABC compound Congesprin Imuran rimadil  Advil Cope Indocin Robaxisal  Alka-Seltzer Effervescent Pain Reliever and Antacid Coricidin or Coricidin-D  Indomethacin Rufen  Alka-Seltzer plus Cold Medicine Cosprin Ketoprofen S-A-C Tablets  Anacin Analgesic Tablets or Capsules Coumadin Korlgesic Salflex  Anacin Extra Strength Analgesic tablets or  capsules CP-2 Tablets Lanoril Salicylate  Anaprox Cuprimine Capsules Levenox Salocol  Anexsia-D Dalteparin Magan Salsalate  Anodynos Darvon compound Magnesium Salicylate Sine-off  Ansaid Dasin Capsules Magsal Sodium Salicylate  Anturane Depen Capsules Marnal Soma  APF Arthritis pain formula Dewitt's Pills Measurin Stanback  Argesic Dia-Gesic Meclofenamic Sulfinpyrazone  Arthritis Bayer Timed Release Aspirin Diclofenac Meclomen Sulindac  Arthritis pain formula Anacin Dicumarol Medipren Supac  Analgesic (Safety coated) Arthralgen Diffunasal Mefanamic Suprofen  Arthritis Strength Bufferin Dihydrocodeine Mepro Compound Suprol  Arthropan liquid Dopirydamole Methcarbomol with Aspirin Synalgos  ASA tablets/Enseals Disalcid Micrainin Tagament  Ascriptin Doan's Midol Talwin  Ascriptin A/D Dolene Mobidin Tanderil  Ascriptin Extra Strength Dolobid Moblgesic Ticlid  Ascriptin with Codeine Doloprin or Doloprin with Codeine Momentum Tolectin  Asperbuf Duoprin Mono-gesic Trendar  Aspergum Duradyne  Motrin or Motrin IB Triminicin  Aspirin plain, buffered or enteric coated Durasal Myochrisine Trigesic  Aspirin Suppositories Easprin Nalfon Trillsate  Aspirin with Codeine Ecotrin Regular or Extra Strength Naprosyn Uracel  Atromid-S Efficin Naproxen Ursinus  Auranofin Capsules Elmiron Neocylate Vanquish  Axotal Emagrin Norgesic Verin  Azathioprine Empirin or Empirin with Codeine Normiflo Vitamin E  Azolid Emprazil Nuprin Voltaren  Bayer Aspirin plain, buffered or children's or timed BC Tablets or powders Encaprin Orgaran Warfarin Sodium  Buff-a-Comp Enoxaparin Orudis Zorpin  Buff-a-Comp with Codeine Equegesic Os-Cal-Gesic   Buffaprin Excedrin plain, buffered or Extra Strength Oxalid   Bufferin Arthritis Strength Feldene Oxphenbutazone   Bufferin plain or Extra Strength Feldene Capsules Oxycodone with Aspirin   Bufferin with Codeine Fenoprofen Fenoprofen Pabalate or Pabalate-SF   Buffets II Flogesic  Panagesic   Buffinol plain or Extra Strength Florinal or Florinal with Codeine Panwarfarin   Buf-Tabs Flurbiprofen Penicillamine   Butalbital Compound Four-way cold tablets Penicillin   Butazolidin Fragmin Pepto-Bismol   Carbenicillin Geminisyn Percodan   Carna Arthritis Reliever Geopen Persantine   Carprofen Gold's salt Persistin   Chloramphenicol Goody's Phenylbutazone   Chloromycetin Haltrain Piroxlcam   Clmetidine heparin Plaquenil   Cllnoril Hyco-pap Ponstel   Clofibrate Hydroxy chloroquine Propoxyphen         Before stopping any of these medications, be sure to consult the physician who ordered them.  Some, such as Coumadin (Warfarin) are ordered to prevent or treat serious conditions such as "deep thrombosis", "pumonary embolisms", and other heart problems.  The amount of time that you may need off of the medication may also vary with the medication and the reason for which you were taking it.  If you are taking any of these medications, please make sure you notify your pain physician before you undergo any procedures.Preparing for Procedure with Sedation Instructions: . Oral Intake: Do not eat or drink anything for at least 8 hours prior to your procedure. . Transportation: Public transportation is not allowed. Bring an adult driver. The driver must be physically present in our waiting room before any procedure can be started. Marland Kitchen Physical Assistance: Bring an adult capable of physically assisting you, in the event you need help. . Blood Pressure Medicine: Take your blood pressure medicine with a sip of water the morning of the procedure. . Insulin: Take only  of your normal insulin dose. . Preventing infections: Shower with an antibacterial soap the morning of your procedure. . Build-up your immune system: Take 1000 mg of Vitamin C with every meal (3 times a day) the day prior to your procedure. . Pregnancy: If you are pregnant, call and cancel the procedure. . Sickness: If you have  a cold, fever, or any active infections, call and cancel the procedure. . Arrival: You must be in the facility at least 30 minutes prior to your scheduled procedure. . Children: Do not bring children with you. . Dress appropriately: Bring dark clothing that you would not mind if they get stained. . Valuables: Do not bring any jewelry or valuables. Procedure appointments are reserved for interventional treatments only. Marland Kitchen No Prescription Refills. . No medication changes will be discussed during procedure appointments. No disability issues will be discussed.   Notified that script for Amitiza, benefiber will be at pharmacy waiting to be picked up.  Script in hand for Tramadol x 2.

## 2016-11-21 NOTE — Telephone Encounter (Signed)
Attempted to contact patient regarding this refill. On review it appears that her potassium was low about a week ago when a general surgeon checked it. I am unsure if she was placed on any supplementation at that time. Please contact her to see if she is taking supplements and please have her come in for recheck of lab work on Thursday. Thanks.

## 2016-11-21 NOTE — Telephone Encounter (Signed)
Please advise on refill. Patient has appointment on 11/26/16

## 2016-11-22 ENCOUNTER — Other Ambulatory Visit: Payer: Medicare HMO

## 2016-11-22 NOTE — Telephone Encounter (Signed)
Spoke with patients husband and he stated that the patient takes the potassium on a daily basis. She has an appointment on 11/26/16 and wants ti have labs done that day.

## 2016-11-22 NOTE — Telephone Encounter (Signed)
Spoke with patient regarding refill. She has been taking 40 mEq twice daily of potassium supplementation since she had her surgery and her potassium was 2.5. Scheduled her for a lab appointment this afternoon. Once this returns we will refill potassium if needed.

## 2016-11-26 ENCOUNTER — Ambulatory Visit (INDEPENDENT_AMBULATORY_CARE_PROVIDER_SITE_OTHER): Payer: Medicare HMO | Admitting: Family Medicine

## 2016-11-26 ENCOUNTER — Telehealth: Payer: Self-pay | Admitting: Family Medicine

## 2016-11-26 ENCOUNTER — Encounter: Payer: Self-pay | Admitting: Family Medicine

## 2016-11-26 ENCOUNTER — Ambulatory Visit
Admission: RE | Admit: 2016-11-26 | Discharge: 2016-11-26 | Disposition: A | Discharge status: Home / Self Care | Payer: Medicare HMO | Source: Ambulatory Visit | Attending: Family Medicine | Admitting: Family Medicine

## 2016-11-26 VITALS — BP 104/70 | HR 75 | Temp 97.8°F | Wt 211.8 lb

## 2016-11-26 DIAGNOSIS — Z9889 Other specified postprocedural states: Secondary | ICD-10-CM | POA: Diagnosis not present

## 2016-11-26 DIAGNOSIS — Z9049 Acquired absence of other specified parts of digestive tract: Secondary | ICD-10-CM | POA: Diagnosis not present

## 2016-11-26 DIAGNOSIS — R002 Palpitations: Secondary | ICD-10-CM | POA: Insufficient documentation

## 2016-11-26 DIAGNOSIS — K449 Diaphragmatic hernia without obstruction or gangrene: Secondary | ICD-10-CM | POA: Insufficient documentation

## 2016-11-26 DIAGNOSIS — R1013 Epigastric pain: Secondary | ICD-10-CM | POA: Insufficient documentation

## 2016-11-26 DIAGNOSIS — I700 Atherosclerosis of aorta: Secondary | ICD-10-CM | POA: Diagnosis not present

## 2016-11-26 DIAGNOSIS — E876 Hypokalemia: Secondary | ICD-10-CM

## 2016-11-26 DIAGNOSIS — Z96643 Presence of artificial hip joint, bilateral: Secondary | ICD-10-CM | POA: Diagnosis not present

## 2016-11-26 DIAGNOSIS — I708 Atherosclerosis of other arteries: Secondary | ICD-10-CM | POA: Diagnosis not present

## 2016-11-26 LAB — COMPREHENSIVE METABOLIC PANEL
ALK PHOS: 76 U/L (ref 39–117)
ALT: 15 U/L (ref 0–35)
AST: 17 U/L (ref 0–37)
Albumin: 3.7 g/dL (ref 3.5–5.2)
BILIRUBIN TOTAL: 0.7 mg/dL (ref 0.2–1.2)
BUN: 7 mg/dL (ref 6–23)
CO2: 28 mEq/L (ref 19–32)
CREATININE: 0.71 mg/dL (ref 0.40–1.20)
Calcium: 9.3 mg/dL (ref 8.4–10.5)
Chloride: 99 mEq/L (ref 96–112)
GFR: 106.04 mL/min (ref >60.00)
GLUCOSE: 90 mg/dL (ref 70–99)
Potassium: 3.4 mEq/L — ABNORMAL LOW (ref 3.5–5.1)
Sodium: 137 mEq/L (ref 135–145)
TOTAL PROTEIN: 7 g/dL (ref 6.0–8.3)

## 2016-11-26 LAB — CBC
HCT: 36.2 % (ref 36.0–46.0)
Hemoglobin: 11.8 g/dL — ABNORMAL LOW (ref 12.0–15.0)
MCHC: 32.5 g/dL (ref 30.0–36.0)
MCV: 81.9 fl (ref 78.0–100.0)
Platelets: 329 10*3/uL (ref 150.0–400.0)
RBC: 4.43 Mil/uL (ref 3.87–5.11)
RDW: 14.9 % (ref 11.5–15.5)
WBC: 5.4 10*3/uL (ref 4.0–10.5)

## 2016-11-26 LAB — TSH: TSH: 1.07 u[IU]/mL (ref 0.35–4.50)

## 2016-11-26 LAB — LIPASE: Lipase: 9 U/L — ABNORMAL LOW (ref 11.0–59.0)

## 2016-11-26 MED ORDER — POTASSIUM CHLORIDE CRYS ER 20 MEQ PO TBCR: 20.0000 meq | EXTENDED_RELEASE_TABLET | Freq: Two times a day (BID) | ORAL | 0 refills | Status: DC

## 2016-11-26 MED ORDER — LACTULOSE 20 G PO PACK: 20.0000 g | PACK | Freq: Every day | ORAL | 0 refills | Status: DC

## 2016-11-26 MED ORDER — IOPAMIDOL (ISOVUE-300) INJECTION 61%
100.0000 mL | Freq: Once | INTRAVENOUS | Status: AC | PRN
  Administered 2016-11-26: 100 mL via INTRAVENOUS

## 2016-11-26 NOTE — Patient Instructions (Signed)
Nice to see you. We are going to get a CT scan today and call you with the results. We will recheck some lab work today as well. I would like for you to see a cardiologist given your intermittent palpitations. I will place a referral for this. If you develop worsening abdominal discomfort, nausea, vomiting, palpitations, chest pain, or any new or changing symptoms please seek medical attention immediately.

## 2016-11-26 NOTE — Assessment & Plan Note (Addendum)
Patient with some abdominal discomfort and lack of bowel movements over the last several weeks. Some nausea. Is passing gas. Has hypoactive bowel sounds. Had similar symptoms several weeks ago and was evaluated in the ED was reassuring CT scan. Today with some tenderness and hypoactive bowel sounds. Concern would be for obstruction. We will obtain CT scan today to evaluate. We'll additionally obtain lab work as outlined below. We'll determine next step in management once CT scan returns. Given return precautions.

## 2016-11-26 NOTE — Assessment & Plan Note (Signed)
Recently low in the surgeon's office. Possibly some palpitations at times. We will recheck potassium today. Check lab results below. EKG with no sign of arrhythmia. We'll refer to cardiology as well.

## 2016-11-26 NOTE — Progress Notes (Signed)
Pre visit review using our clinic review tool, if applicable. No additional management support is needed unless otherwise documented below in the visit note. 

## 2016-11-26 NOTE — Progress Notes (Signed)
[name], MD Phone: [phone]  Maria Hamilton is a 65 y.o. female who presents today for follow-up.  Constipation: Patient notes issues having bowel movements over the last 3-4 weeks. Has not had a bowel movement in at least 3 weeks. Has had some intermittent epigastric discomfort with this. Has been passing gas recently. Some nausea that goes when she lays down. No vomiting. She notes the abdominal pain will last for 5-10 minutes and goes away on its own. She's tried MiraLAX, Ex-Lax, and other over-the-counter medications. She's also taking lubiprostone with little benefit. Seen in the ED for similar symptoms a little over a month ago. Had a CT scan that revealed constipation and postsurgical changes.  Hypokalemia: Has been low recently. She's been taking HCTZ still. Taking potassium supplementation. Ran out of potassium last night. Patient does note intermittent palpitations that are rare. Feels as though her heart races for short period of time and it will go away on its own. No chest pain with this. She can't remember the last time her heart raced.  PMH: nonsmoker.   ROS see history of present illness  Objective  Physical Exam Vitals:   11/26/16 1110  BP: 104/70  Pulse: 75  Temp: 97.8 F (36.6 C)    BP Readings from Last 3 Encounters:  11/26/16 104/70  11/20/16 (!) 113/93  11/15/16 97/64   Wt Readings from Last 3 Encounters:  11/26/16 211 lb 12.8 oz (96.1 kg)  11/20/16 213 lb (96.6 kg)  11/15/16 213 lb (96.6 kg)    Physical Exam  Constitutional: No distress.  Cardiovascular: Normal rate, regular rhythm and normal heart sounds.   Pulmonary/Chest: Effort normal and breath sounds normal.  Abdominal: Soft. She exhibits no distension. There is no rebound and no guarding.  Hypoactive bowel sounds, mild epigastric tenderness  Neurological: She is alert.  Skin: Skin is warm and dry. She is not diaphoretic.   EKG: Normal sinus rhythm, low voltage in limb leads,  inverted T-wave in v2 similar to previous  Assessment/Plan: Please see individual problem list.  Hypokalemia Recently low in the surgeon's office. Possibly some palpitations at times. We will recheck potassium today. Check lab results below. EKG with no sign of arrhythmia. We'll refer to cardiology as well.  Epigastric pain Patient with some abdominal discomfort and lack of bowel movements over the last several weeks. Some nausea. Is passing gas. Has hypoactive bowel sounds. Had similar symptoms several weeks ago and was evaluated in the ED was reassuring CT scan. Today with some tenderness and hypoactive bowel sounds. Concern would be for obstruction. We will obtain CT scan today to evaluate. We'll additionally obtain lab work as outlined below. We'll determine next step in management once CT scan returns. Given return precautions.  Palpitations Patient with infrequent palpitations. EKG today reassuring. Could be related to her potassium and we will recheck this today. Given intermittent and persistent nature of these we will additionally refer to cardiology for further evaluation. She's given return precautions.   Orders Placed This Encounter  Procedures  . CT Abdomen Pelvis W Contrast    Standing Status:   Future    Number of Occurrences:   1    Standing Expiration Date:   02/24/2018    Order Specific Question:   If indicated for the ordered procedure, I authorize the administration of contrast media per Radiology protocol    Answer:   Yes    Order Specific Question:   Reason for Exam (SYMPTOM  OR DIAGNOSIS REQUIRED)  Answer:   no BM in 3-4 weeks, intermittent abdominal pain, mild decrease bowel sounds, is passing gas    Order Specific Question:   Preferred imaging location?    Answer:   [facility]    Order Specific Question:   Call Results- Best Contact Number?    Answer:   call report [phone]/ hold patient  . Comp Met (CMET)  . CBC  . TSH  . Lipase  . Ambulatory  referral to Cardiology    Referral Priority:   Routine    Referral Type:   Consultation    Referral Reason:   Specialty Services Required    Requested Specialty:   Cardiology    Number of Visits Requested:   1  . EKG 12-Lead    [name], MD [place]

## 2016-11-26 NOTE — Telephone Encounter (Signed)
Discussed CT scan findings with the patient. Advised she is likely constipated as a cause of her constipation. We will trial treatment with lactulose. Potassium still slightly low. We'll continue supplementation and recheck on Friday. Discussed decreasing her dose of HCTZ to 12.5 mg and monitoring her blood pressure.

## 2016-11-27 NOTE — Assessment & Plan Note (Addendum)
Patient with infrequent palpitations. EKG today reassuring. Could be related to her potassium and we will recheck this today. Given intermittent and persistent nature of these we will additionally refer to cardiology for further evaluation. She's given return precautions.

## 2016-11-28 ENCOUNTER — Telehealth: Payer: Self-pay | Admitting: *Deleted

## 2016-11-28 ENCOUNTER — Other Ambulatory Visit: Payer: Self-pay

## 2016-11-28 MED ORDER — POTASSIUM CHLORIDE CRYS ER 20 MEQ PO TBCR: 20.0000 meq | EXTENDED_RELEASE_TABLET | Freq: Two times a day (BID) | ORAL | 0 refills | Status: DC

## 2016-11-28 MED ORDER — LACTULOSE 20 G PO PACK: 20.0000 g | PACK | Freq: Every day | ORAL | 0 refills | Status: DC

## 2016-11-28 NOTE — Telephone Encounter (Signed)
Patient's husband notified  

## 2016-11-28 NOTE — Telephone Encounter (Signed)
Left message to return call 

## 2016-11-28 NOTE — Telephone Encounter (Signed)
Patient should stop the lactulose as well. Thanks.

## 2016-11-28 NOTE — Telephone Encounter (Signed)
Patient can try over-the-counter Claritin for itching. Please see if she has taken the lactulose and if she has taken this medication did the itching start before or after taking this medication. If her itching does not resolve or she develops any new symptoms she should be evaluated for it. Thanks.

## 2016-11-28 NOTE — Telephone Encounter (Signed)
Patient's husband has requested a medication called into the pharmacy for itching. Patient thinks the itching is coming from a medication prescribed Pharmacy [facility]

## 2016-11-28 NOTE — Telephone Encounter (Signed)
Pt husband called returning your call. Thank you!

## 2016-11-28 NOTE — Telephone Encounter (Signed)
Patient states the itching started after starting the lactulose. Informed patient to take the Claritin and to let us know if it does not get better.

## 2016-11-28 NOTE — Telephone Encounter (Signed)
Patient states it started a couple days ago. She does not have a rash,  patient is unsure of what medication it may be from. It is itching all over her body.

## 2016-12-02 ENCOUNTER — Telehealth: Payer: Self-pay | Admitting: Family Medicine

## 2016-12-02 NOTE — Telephone Encounter (Signed)
We will have CMA contact patient to see if we can track down her most recent colonoscopy report. Please see GI message. If most recent colonoscopy was normal and prep adequate will plan for repeat colonoscopy as scheduled.

## 2016-12-02 NOTE — Telephone Encounter (Signed)
-----   Message from Jerene Bears, MD sent at 11/30/2016 10:00 AM EST ----- Regarding: RE: Question regarding a patient Randall Hiss,  I got your email and reviewed the patient's CT scan. 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  This valve is normally thicker than the cecum and ascending colon mucosa.   This is certainly seen from time to time a colonoscopy. If her issues resolve, this finding alone would not prompt me to perform colonoscopy, unless she is not up-to-date with her screening examinations. It appears by the record that she may have had a colonoscopy in 2011 but I do not see the physical report. It would make me feel better to have this report so that it can be reviewed to ensure the exam was complete and the prep adequate. I hope this helps. Let me know if you have more questions Best Ulice Dash   ----- Message ----- From: Leone Haven, MD Sent: 11/28/2016   6:40 PM To: Jerene Bears, MD Subject: Question regarding a patient                   Hi Dr Hilarie Fredrickson,  I'm one of the relatively new primary care providers with [facility] in [place] and got your name from one of my colleagues, Dr Lacinda Axon, to see if you could help me with a question regarding a patient. I appreciate any help you can provide. I obtained a CT abd/pelvis on this patient that revealed lipomatous infiltration of the ileocecal valve. All of the research I have done has not pointed me in any specific direction regarding further evaluation. I wanted to see if this is something that should be evaluated by GI or just monitored. She additionally was found to have fatty liver and fatty infiltration of the pancreas. I have included a little back ground info on the patient. She is a 65 yo female who I saw earlier this week for abdominal pain and lack of a BM for at least 3 weeks. She'd had decreased bowel sounds at that time and had a cholecystectomy about a month prior.  She was tender on exam in the epigastric region and had mild nausea. Thanks for any help you can provide.   [name]

## 2016-12-03 NOTE — Telephone Encounter (Signed)
Entered  By [facility], will mail release form to patient to release info regarding colonoscopy

## 2016-12-20 NOTE — Telephone Encounter (Signed)
Have we received anything regarding this?

## 2016-12-20 NOTE — Telephone Encounter (Signed)
I have called [facility] and they do not have any record of a colonoscopy either. Im not sure if this was completed.

## 2016-12-20 NOTE — Telephone Encounter (Signed)
Patients husband states he believes he has sent the form to [place] but will check on this

## 2016-12-26 NOTE — Telephone Encounter (Signed)
Left message to return call 

## 2016-12-26 NOTE — Telephone Encounter (Signed)
Please contact the patient to check on this. If they have not sent this to [place], please have them come in to sign the release. Thanks.

## 2016-12-27 NOTE — Telephone Encounter (Signed)
Patients husband states he will come by today and fill out a release to obtain records from [place]

## 2016-12-29 ENCOUNTER — Emergency Department: Payer: Medicare HMO

## 2016-12-29 ENCOUNTER — Emergency Department
Admission: EM | Admit: 2016-12-29 | Discharge: 2016-12-29 | Disposition: A | Discharge status: Home / Self Care | Payer: Medicare HMO | Attending: Emergency Medicine | Admitting: Emergency Medicine

## 2016-12-29 DIAGNOSIS — J069 Acute upper respiratory infection, unspecified: Secondary | ICD-10-CM | POA: Insufficient documentation

## 2016-12-29 DIAGNOSIS — R6889 Other general symptoms and signs: Secondary | ICD-10-CM

## 2016-12-29 DIAGNOSIS — R112 Nausea with vomiting, unspecified: Secondary | ICD-10-CM | POA: Diagnosis not present

## 2016-12-29 DIAGNOSIS — I10 Essential (primary) hypertension: Secondary | ICD-10-CM | POA: Diagnosis not present

## 2016-12-29 DIAGNOSIS — R197 Diarrhea, unspecified: Secondary | ICD-10-CM | POA: Diagnosis not present

## 2016-12-29 DIAGNOSIS — Z79899 Other long term (current) drug therapy: Secondary | ICD-10-CM | POA: Insufficient documentation

## 2016-12-29 DIAGNOSIS — R05 Cough: Secondary | ICD-10-CM | POA: Diagnosis present

## 2016-12-29 LAB — INFLUENZA PANEL BY PCR (TYPE A & B)
INFLBPCR: NEGATIVE
Influenza A By PCR: NEGATIVE

## 2016-12-29 MED ORDER — ALBUTEROL SULFATE HFA 108 (90 BASE) MCG/ACT IN AERS: 2.0000 | INHALATION_SPRAY | Freq: Four times a day (QID) | RESPIRATORY_TRACT | 0 refills | Status: DC | PRN

## 2016-12-29 MED ORDER — ACETAMINOPHEN-CODEINE #3 300-30 MG PO TABS: 1.0000 | ORAL_TABLET | Freq: Three times a day (TID) | ORAL | 0 refills | Status: DC | PRN

## 2016-12-29 MED ORDER — BENZONATATE 100 MG PO CAPS
ORAL_CAPSULE | ORAL | 0 refills | Status: DC
Start: 2016-12-29 — End: 2017-02-04

## 2016-12-29 MED ORDER — IPRATROPIUM-ALBUTEROL 0.5-2.5 (3) MG/3ML IN SOLN
3.0000 mL | Freq: Once | RESPIRATORY_TRACT | Status: AC
Start: 2016-12-29 — End: 2016-12-29
  Administered 2016-12-29: 3 mL via RESPIRATORY_TRACT
  Filled 2016-12-29: qty 3

## 2016-12-29 NOTE — ED Notes (Signed)
Mask placed on patient

## 2016-12-29 NOTE — ED Triage Notes (Signed)
Pt arrives to ER via POV c/o cough, URI sx and body aches X 2 day. Pt alert and oriented X4, active, cooperative, pt in NAD. RR even and unlabored, color WNL.

## 2016-12-29 NOTE — Discharge Instructions (Signed)
Your chest x-ray and flu test were negative today. You still have symptoms consistent with a viral upper respiratory infection. Take the prescription meds as directed. Start an OTC Delsym or Robitussin cough syrup. Consider using a room humidifier overnight. Follow-up with your provider for continued symptoms. If the albuterol inhaler is too expensive, still start the other two prescriptions.

## 2016-12-29 NOTE — ED Notes (Signed)
See triage note   Developed body aches with cough ,hot flashes and chills  Sx's about 1 week ago or so  But now states cough is worse afebrile on arrival

## 2016-12-29 NOTE — ED Provider Notes (Signed)
[facility] Emergency Department Provider Note ____________________________________________  Time seen: 1622  I have reviewed the triage vital signs and the nursing notes.  HISTORY  Chief Complaint  Cough and Generalized Body Aches   HPI Maria Hamilton is a 66 y.o. female presents to the ED for evaluation of about a2 day complaint of cough and cold symptoms. The patient describes an intermittently productive cough and sore throat over the last few days. She describes last week she has symptoms including nausea, vomiting, and diarrhea. Those symptoms have since resolved. She also reports generalized body aches, fatigue, subjective fevers and sweats. She does report receiving the seasonal flu vaccine and her pneumonia vaccine as well. She denies any sick contacts, recent travel, or bad food exposure.  Past Medical History:  Diagnosis Date  . Anxiety   . Chronic hip pain   . Chronic pain syndrome   . Depression   . Edema   . Fatigue   . Fibromyalgia   . GERD (gastroesophageal reflux disease)   . Headache   . Hypertension   . Insomnia   . Low back pain   . Vitamin D deficiency     Patient Active Problem List   Diagnosis Date Noted  . Palpitations 11/26/2016  . Epigastric pain 11/26/2016  . Therapeutic opioid-induced constipation (OIC) 11/20/2016  . Right upper quadrant abdominal pain   . Constipation   . Acute pancreatitis 10/14/2016  . Cholecystitis   . Altered mental status 10/12/2016  . Hypokalemia 09/27/2016  . Long term current use of opiate analgesic 09/24/2016  . Long term prescription opiate use 09/24/2016  . Opiate use 09/24/2016  . Encounter for therapeutic drug level monitoring 09/24/2016  . Encounter for pain management planning 09/24/2016  . Rheumatoid arthritis, multiple sites (positive RF) 09/24/2016  . Chronic lower extremity pain (Location of Secondary source of pain) (Left) 09/24/2016  . Lumbar facet arthropathy (Bilateral)  09/24/2016  . Failed back surgical syndrome (x 3) 09/24/2016  . Epidural fibrosis 09/24/2016  . Neurogenic pain 09/24/2016  . Musculoskeletal pain 09/24/2016  . Chronic lumbar radicular pain (L5/S1 dermatome) (Location of Secondary source of pain) (Left) 09/24/2016  . History of total hip replacement, bilateral 09/24/2016  . GERD (gastroesophageal reflux disease) 08/03/2015  . Acute anxiety 08/03/2015  . Rheumatoid factor positive 08/03/2015  . Fatigue 08/03/2015  . Vitamin D deficiency 08/03/2015  . Insomnia 08/03/2015  . Fibromyalgia 08/03/2015  . Chronic low back pain (Location of Primary Source of Pain) (Left) 08/03/2015  . Chronic pain syndrome 08/03/2015  . Depression 08/03/2015  . Hypertension 08/03/2015  . Cellulitis due to MRSA 08/03/2015  . Mechanical complication of internal joint prosthesis ([place]) 04/22/2012    Past Surgical History:  Procedure Laterality Date  . ABDOMINAL HYSTERECTOMY    . BACK SURGERY    . BREAST SURGERY    . CHOLECYSTECTOMY N/A 10/16/2016   Procedure: LAPAROSCOPIC CHOLECYSTECTOMY WITH INTRAOPERATIVE CHOLANGIOGRAM;  Surgeon: Dia Crawford III, MD;  Location: [facility] ORS;  Service: General;  Laterality: N/A;  . cyst removal from wrist    . ECTOPIC PREGNANCY SURGERY    . HIP SURGERY     x4    Prior to Admission medications   Medication Sig Start Date End Date Taking? Authorizing Provider  acetaminophen-codeine (TYLENOL #3) 300-30 MG tablet Take 1 tablet by mouth every 8 (eight) hours as needed for moderate pain. 12/29/16   Clearnce Leja V Bacon Aiyonna Lucado, PA-C  albuterol (PROVENTIL HFA;VENTOLIN HFA) 108 (90 Base) MCG/ACT inhaler Inhale 2  puffs into the lungs every 6 (six) hours as needed for wheezing or shortness of breath. 12/29/16   Braysen Cloward V Bacon Jaelen Soth, PA-C  amLODipine (NORVASC) 5 MG tablet Take 1 tablet (5 mg total) by mouth daily. 05/29/16   Leone Haven, MD  benzonatate (TESSALON PERLES) 100 MG capsule Take 1-2 tabs TID 12/29/16   Elner Seifert V Bacon Jamarien Rodkey,  PA-C  escitalopram (LEXAPRO) 20 MG tablet Take 1 tablet (20 mg total) by mouth daily. 05/29/16   Leone Haven, MD  folic acid (FOLVITE) 1 MG tablet Take 1 tablet by mouth 1 day or 1 dose. 10/17/16   Historical Provider, MD  hydrochlorothiazide (HYDRODIURIL) 25 MG tablet Take 1 tablet (25 mg total) by mouth daily. 05/29/16   Leone Haven, MD  lactulose (CEPHULAC) 20 g packet Take 1 packet (20 g total) by mouth daily. Until having good BMs 11/28/16   Leone Haven, MD  lubiprostone (AMITIZA) 8 MCG capsule Take 1 capsule (8 mcg total) by mouth 2 (two) times daily with a meal. Swallow the medication whole. Do not break or chew the medication. 11/20/16   Milinda Pointer, MD  polyethylene glycol (MIRALAX / GLYCOLAX) packet Take 17 g by mouth daily. 10/25/16   Florene Glen, MD  polyethylene glycol powder [facility]) powder  10/25/16   Historical Provider, MD  potassium chloride SA (K-DUR,KLOR-CON) 20 MEQ tablet Take 1 tablet (20 mEq total) by mouth 2 (two) times daily. 11/28/16   Leone Haven, MD  traMADol (ULTRAM) 50 MG tablet Take 1-2 tablets (50-100 mg total) by mouth every 6 (six) hours as needed for severe pain. 11/20/16 12/20/16  Milinda Pointer, MD  traMADol (ULTRAM) 50 MG tablet Take 1-2 tablets (50-100 mg total) by mouth every 6 (six) hours as needed. 12/20/16 01/19/17  Milinda Pointer, MD  Wheat Dextrin (BENEFIBER) POWD Stir 2 tsp. TID into 4-8 oz of any non-carbonated beverage or soft food (hot or cold) 11/20/16   Milinda Pointer, MD    Allergies Ace inhibitors; Ciprofloxacin; Morphine and related; Penicillin g benzathine; and Tape  Family History  Problem Relation Age of Onset  . Heart disease Mother   . Cancer Father   . Alcohol abuse Father     Social History Social History  Substance Use Topics  . Smoking status: Never Smoker  . Smokeless tobacco: Never Used  . Alcohol use No    Review of Systems  Constitutional: Positive for subjective fever. Eyes:  Negative for visual changes. ENT: Negative for sore throat. Cardiovascular: Negative for chest pain. Respiratory: Negative for shortness of breath. Reports cough as above. Gastrointestinal: Negative for abdominal pain, vomiting and diarrhea. Genitourinary: Negative for dysuria. Musculoskeletal: Negative for back pain. ____________________________________________  PHYSICAL EXAM:  VITAL SIGNS: ED Triage Vitals [12/29/16 1540]  Enc Vitals Group     BP 130/73     Pulse Rate 85     Resp 20     Temp 98.9 F (37.2 C)     Temp Source Oral     SpO2 100 %     Weight 206 lb (93.4 kg)     Height 6\' 1"  (1.854 m)     Head Circumference      Peak Flow      Pain Score      Pain Loc      Pain Edu?      Excl. in [place]?     Constitutional: Alert and oriented. Well appearing and in no distress. Head: Normocephalic and atraumatic.  Eyes: Conjunctivae are normal. PERRL. Normal extraocular movements Ears: Canals clear. TMs intact bilaterally. Nose: No congestion/rhinorrhea/epistaxis. Mouth/Throat: Mucous membranes are moist.Uvula is midline and tonsils are flat. No oropharyngeal erythema noted. Cardiovascular: Normal rate, regular rhythm. Normal distal pulses. Respiratory: Normal respiratory effort. No wheezes/rales/rhonchi. Gastrointestinal: Soft and nontender. No distention. Musculoskeletal: Nontender with normal range of motion in all extremities.  Neurologic:  Normal gait without ataxia. Normal speech and language. No gross focal neurologic deficits are appreciated. ____________________________________________   LABS (pertinent positives/negatives) Labs Reviewed  INFLUENZA PANEL BY PCR (TYPE A & B, H1N1)  ____________________________________________   PROCEDURES  DuoNeb x 1 _____________________________________________  RADIOLOGY CXR IMPRESSION: No active cardiopulmonary disease. ____________________________________________  INITIAL IMPRESSION / ASSESSMENT AND PLAN / ED  COURSE  Patient with symptoms consistent with a viral URI which may represent an influenza. Flu test was negative in the ED. Patient will be discharged with prescriptions for Tessalon Perles, Tylenol #3, and albuterol for symptom relief. She is also advised to dose an OTC dextromethorphan syrup. She will follow-up with her primary care provider for continued symptoms.    Clinical Course    ____________________________________________  FINAL CLINICAL IMPRESSION(S) / ED DIAGNOSES  Final diagnoses:  Viral upper respiratory tract infection  Flu-like symptoms      Melvenia Needles, PA-C 12/29/16 [place], MD 12/29/16 2015

## 2016-12-31 ENCOUNTER — Ambulatory Visit (INDEPENDENT_AMBULATORY_CARE_PROVIDER_SITE_OTHER): Payer: Medicare HMO | Admitting: Family Medicine

## 2016-12-31 ENCOUNTER — Encounter: Payer: Self-pay | Admitting: Family Medicine

## 2016-12-31 DIAGNOSIS — J209 Acute bronchitis, unspecified: Secondary | ICD-10-CM | POA: Diagnosis not present

## 2016-12-31 HISTORY — DX: Acute bronchitis, unspecified: J20.9

## 2016-12-31 MED ORDER — AZITHROMYCIN 250 MG PO TABS: ORAL_TABLET | ORAL | 0 refills | Status: DC

## 2016-12-31 MED ORDER — HYDROCOD POLST-CPM POLST ER 10-8 MG/5ML PO SUER: 5.0000 mL | Freq: Two times a day (BID) | ORAL | 0 refills | Status: DC | PRN

## 2016-12-31 NOTE — Assessment & Plan Note (Addendum)
Symptoms most consistent with bronchitis. Vital signs are stable. No focal findings on lung exam to indicate pneumonia. Given feverishness with sweats and continued cough we will treat with azithromycin to cover for bronchitis. Tussionex for cough. Patient states she has tolerated this previously with no adverse effects. Advised her not to take her pain medications while taking the Tussionex. Advised that this could make her drowsy. Discussed that if she has not started to feel better with this treatment she should follow-up. Given return precautions.

## 2016-12-31 NOTE — Progress Notes (Signed)
  [name], MD Phone: [phone]  Maria Hamilton is a 66 y.o. female who presents today for same day visit.  Patient notes a little over a week of cough and congestion. Notes no upper respiratory symptoms with this. Not coughing anything up. No shortness of breath. Has become sore in her chest with her cough though no chest pain. Has felt feverish and had chills and sweats as well. No wheezing. Was evaluated in the emergency room with a reassuring chest x-ray and a negative flu test 2 days ago. Has not responded to Tessalon or Tylenol 3 for cough.   PMH: nonsmoker.   ROS see history of present illness  Objective  Physical Exam Vitals:   12/31/16 0947  BP: 120/76  Pulse: 87  Temp: 98.6 F (37 C)    BP Readings from Last 3 Encounters:  12/31/16 120/76  12/29/16 127/72  11/26/16 104/70   Wt Readings from Last 3 Encounters:  12/31/16 200 lb 9.6 oz (91 kg)  12/29/16 206 lb (93.4 kg)  11/26/16 211 lb 12.8 oz (96.1 kg)    Physical Exam  Constitutional: No distress.  HENT:  Head: Normocephalic and atraumatic.  Mouth/Throat: Oropharynx is clear and moist. No oropharyngeal exudate.  Normal TMs bilaterally  Eyes: Conjunctivae are normal. Pupils are equal, round, and reactive to light.  Cardiovascular: Normal rate, regular rhythm and normal heart sounds.   Pulmonary/Chest: Effort normal and breath sounds normal. She exhibits tenderness (bilateral costochondral joints tender to palpation).  Musculoskeletal: She exhibits no edema.  Neurological: She is alert. Gait normal.  Skin: She is not diaphoretic.     Assessment/Plan: Please see individual problem list.  Acute bronchitis Symptoms most consistent with bronchitis. Vital signs are stable. No focal findings on lung exam to indicate pneumonia. Given feverishness with sweats and continued cough we will treat with azithromycin to cover for bronchitis. Tussionex for cough. Patient states she has tolerated this  previously with no adverse effects. Advised her not to take her pain medications while taking the Tussionex. Advised that this could make her drowsy. Discussed that if she has not started to feel better with this treatment she should follow-up. Given return precautions.   No orders of the defined types were placed in this encounter.   Meds ordered this encounter  Medications  . azithromycin (ZITHROMAX) 250 MG tablet    Sig: Please take 500 mg (2 tablets) by mouth today, then take 250 mg (one tablet) by mouth daily    Dispense:  6 tablet    Refill:  0  . chlorpheniramine-HYDROcodone (TUSSIONEX PENNKINETIC ER) 10-8 MG/5ML SUER    Sig: Take 5 mLs by mouth every 12 (twelve) hours as needed for cough.    Dispense:  115 mL    Refill:  0    [name], MD [place]

## 2016-12-31 NOTE — Patient Instructions (Signed)
Nice to see you. Your symptoms are likely related to bronchitis. We will treat you with azithromycin for this. You can use Tussionex for cough. May make you drowsy so only take this when you can sleep. Do not take this medication with any pain medications. If you develop shortness of breath, fevers, cough productive of blood, or any new or changing symptoms please seek medical attention immediately.

## 2016-12-31 NOTE — Progress Notes (Signed)
Pre visit review using our clinic review tool, if applicable. No additional management support is needed unless otherwise documented below in the visit note. 

## 2017-01-04 NOTE — Telephone Encounter (Signed)
Colonoscopy report received and reviewed. The quality of bowel preparation was good. The colonoscopy was complete. These will be scanned into the system. We'll continue to monitor patient's symptoms and if not improving will refer to GI.

## 2017-01-09 ENCOUNTER — Ambulatory Visit
Admission: RE | Admit: 2017-01-09 | Discharge: 2017-01-09 | Disposition: A | Discharge status: Home / Self Care | Payer: Medicare HMO | Source: Ambulatory Visit | Attending: Pain Medicine | Admitting: Pain Medicine

## 2017-01-09 ENCOUNTER — Encounter: Payer: Self-pay | Admitting: Pain Medicine

## 2017-01-09 ENCOUNTER — Ambulatory Visit (HOSPITAL_BASED_OUTPATIENT_CLINIC_OR_DEPARTMENT_OTHER): Payer: Medicare HMO | Admitting: Pain Medicine

## 2017-01-09 VITALS — BP 143/68 | HR 85 | Temp 98.6°F | Resp 16 | Ht 73.0 in | Wt 217.0 lb

## 2017-01-09 DIAGNOSIS — G8929 Other chronic pain: Secondary | ICD-10-CM

## 2017-01-09 DIAGNOSIS — M5416 Radiculopathy, lumbar region: Secondary | ICD-10-CM | POA: Diagnosis present

## 2017-01-09 DIAGNOSIS — M5442 Lumbago with sciatica, left side: Secondary | ICD-10-CM

## 2017-01-09 DIAGNOSIS — M961 Postlaminectomy syndrome, not elsewhere classified: Secondary | ICD-10-CM

## 2017-01-09 DIAGNOSIS — M79605 Pain in left leg: Secondary | ICD-10-CM | POA: Diagnosis not present

## 2017-01-09 DIAGNOSIS — Z9889 Other specified postprocedural states: Secondary | ICD-10-CM | POA: Insufficient documentation

## 2017-01-09 DIAGNOSIS — Z90710 Acquired absence of both cervix and uterus: Secondary | ICD-10-CM | POA: Insufficient documentation

## 2017-01-09 DIAGNOSIS — Z880 Allergy status to penicillin: Secondary | ICD-10-CM | POA: Insufficient documentation

## 2017-01-09 DIAGNOSIS — Z888 Allergy status to other drugs, medicaments and biological substances status: Secondary | ICD-10-CM | POA: Diagnosis not present

## 2017-01-09 MED ORDER — LIDOCAINE HCL (PF) 1 % IJ SOLN
10.0000 mL | Freq: Once | INTRAMUSCULAR | Status: AC
  Administered 2017-01-09: 10 mL
  Filled 2017-01-09: qty 10

## 2017-01-09 MED ORDER — IOPAMIDOL (ISOVUE-M 200) INJECTION 41%
10.0000 mL | Freq: Once | INTRAMUSCULAR | Status: AC
  Administered 2017-01-09: 10 mL via EPIDURAL
  Filled 2017-01-09: qty 10

## 2017-01-09 MED ORDER — FENTANYL CITRATE (PF) 100 MCG/2ML IJ SOLN
25.0000 ug | INTRAMUSCULAR | Status: DC | PRN
Start: 2017-01-09 — End: 2017-01-15
  Administered 2017-01-09: 50 ug via INTRAVENOUS
  Filled 2017-01-09: qty 2

## 2017-01-09 MED ORDER — MIDAZOLAM HCL 5 MG/5ML IJ SOLN
1.0000 mg | INTRAMUSCULAR | Status: DC | PRN
  Administered 2017-01-09 (×2): 2 mg via INTRAVENOUS
  Filled 2017-01-09: qty 5

## 2017-01-09 MED ORDER — SODIUM CHLORIDE 0.9 % IJ SOLN
INTRAMUSCULAR | Status: AC
  Filled 2017-01-09: qty 10

## 2017-01-09 MED ORDER — ROPIVACAINE HCL 2 MG/ML IJ SOLN
2.0000 mL | Freq: Once | INTRAMUSCULAR | Status: AC
  Administered 2017-01-09: 2 mL via EPIDURAL
  Filled 2017-01-09: qty 10

## 2017-01-09 MED ORDER — SODIUM CHLORIDE 0.9% FLUSH
2.0000 mL | Freq: Once | INTRAVENOUS | Status: AC
  Administered 2017-01-09: 2 mL

## 2017-01-09 MED ORDER — SODIUM CHLORIDE 0.9 % IJ SOLN
INTRAMUSCULAR | Status: AC
  Administered 2017-01-09: 9 mL
  Filled 2017-01-09: qty 10

## 2017-01-09 MED ORDER — TRIAMCINOLONE ACETONIDE 40 MG/ML IJ SUSP
40.0000 mg | Freq: Once | INTRAMUSCULAR | Status: AC
  Administered 2017-01-09: 40 mg
  Filled 2017-01-09: qty 1

## 2017-01-09 MED ORDER — LACTATED RINGERS IV SOLN: 1000.0000 mL | Freq: Once | INTRAVENOUS | Status: DC

## 2017-01-09 MED ORDER — DIPHENHYDRAMINE HCL 50 MG/ML IJ SOLN
INTRAMUSCULAR | Status: AC
  Administered 2017-01-09: 50 mg via INTRAVENOUS
  Filled 2017-01-09: qty 1

## 2017-01-09 NOTE — Progress Notes (Signed)
Safety precautions to be maintained throughout the outpatient stay will include: orient to surroundings, keep bed in low position, maintain call bell within reach at all times, provide assistance with transfer out of bed and ambulation.  

## 2017-01-09 NOTE — Progress Notes (Signed)
Patient's Name: Maria Hamilton  MRN: [id number]  Referring Provider: Glori Luis, MD  DOB: Mar 15, 1951  PCP: Glori Luis, MD  DOS: 01/09/2017  Note by: Sydnee Levans. Laban Emperor, MD  Service setting: Ambulatory outpatient  Location: [facility] (AMB) Pain Management Facility  Visit type: Procedure  Specialty: Interventional Pain Management  Patient type: Established   Primary Reason for Visit: Interventional Pain Management Treatment. CC: Back Pain (lower, left)  Procedure:  Anesthesia, Analgesia, Anxiolysis:  Type: Diagnostic Epidural Steroid Injection Region: Caudal Level: Sacrococcygeal   Laterality: Midline aiming at the left  Type: Local Anesthesia with Moderate (Conscious) Sedation Local Anesthetic: Lidocaine 1% Route: Intravenous (IV) IV Access: Secured Sedation: Meaningful verbal contact was maintained at all times during the procedure  Indication(s): Analgesia and Anxiety  Indications: 1. Chronic lumbar radicular pain (L5/S1 dermatome) (Location of Secondary source of pain) (Left)   2. Chronic lower extremity pain (Location of Secondary source of pain) (Left)   3. Chronic low back pain (Location of Primary Source of Pain) (Left)   4. Failed back surgical syndrome (x 3)    Pain Score: Pre-procedure: 5 /10 Post-procedure: 4 /10  Pre-op Assessment:  Previous date of service: 11/20/16 Service provided: Evaluation, Med Refill Maria Hamilton is a 66 y.o. (year old), female patient, seen today for interventional treatment. She  has a past surgical history that includes Breast surgery; Back surgery; Ectopic pregnancy surgery; Hip surgery; Abdominal hysterectomy; cyst removal from wrist; and Cholecystectomy (N/A, 10/16/2016). Her primarily concern today is the Back Pain (lower, left)  Initial Vital Signs: Blood pressure (!) 143/68, pulse 85, temperature 98.6 F (37 C), temperature source Oral, resp. rate 16, height 6\' 1"  (1.854 m), weight 217 lb (98.4 kg), SpO2 99 %. BMI: 28.63  kg/m  Risk Assessment: Allergies: Reviewed. She is allergic to ace inhibitors; ciprofloxacin; fentanyl; morphine and related; penicillin g benzathine; and tape. Allergy Precautions: None required Coagulopathies: "Reviewed. None identified.  Blood-thinner therapy: None at this time Active Infection(s): Reviewed. None identified. Maria Hamilton is afebrile  Site Confirmation: Maria Hamilton was asked to confirm the procedure and laterality before marking the site Procedure checklist: Completed Consent: Before the procedure and under the influence of no sedative(s), amnesic(s), or anxiolytics, the patient was informed of the treatment options, risks and possible complications. To fulfill our ethical and legal obligations, as recommended by the American Medical Association's Code of Ethics, I have informed the patient of my clinical impression; the nature and purpose of the treatment or procedure; the risks, benefits, and possible complications of the intervention; the alternatives, including doing nothing; the risk(s) and benefit(s) of the alternative treatment(s) or procedure(s); and the risk(s) and benefit(s) of doing nothing. The patient was provided information about the general risks and possible complications associated with the procedure. These may include, but are not limited to: failure to achieve desired goals, infection, bleeding, organ or nerve damage, allergic reactions, paralysis, and death. In addition, the patient was informed of those risks and complications associated to Spine-related procedures, such as failure to decrease pain; infection (i.e.: Meningitis, epidural or intraspinal abscess); bleeding (i.e.: epidural hematoma, subarachnoid hemorrhage, or any other type of intraspinal or peri-dural bleeding); organ or nerve damage (i.e.: Any type of peripheral nerve, nerve root, or spinal cord injury) with subsequent damage to sensory, motor, and/or autonomic systems, resulting in permanent  pain, numbness, and/or weakness of one or several areas of the body; allergic reactions; (i.e.: anaphylactic reaction); and/or death. Furthermore, the patient was informed of those risks and complications  associated with the medications. These include, but are not limited to: allergic reactions (i.e.: anaphylactic or anaphylactoid reaction(s)); adrenal axis suppression; blood sugar elevation that in diabetics may result in ketoacidosis or comma; water retention that in patients with history of congestive heart failure may result in shortness of breath, pulmonary edema, and decompensation with resultant heart failure; weight gain; swelling or edema; medication-induced neural toxicity; particulate matter embolism and blood vessel occlusion with resultant organ, and/or nervous system infarction; and/or aseptic necrosis of one or more joints. Finally, the patient was informed that Medicine is not an exact science; therefore, there is also the possibility of unforeseen or unpredictable risks and/or possible complications that may result in a catastrophic outcome. The patient indicated having understood very clearly. We have given the patient no guarantees and we have made no promises. Enough time was given to the patient to ask questions, all of which were answered to the patient's satisfaction. [name] has indicated that she wanted to continue with the procedure. Attestation: I, the ordering provider, attest that I have discussed with the patient the benefits, risks, side-effects, alternatives, likelihood of achieving goals, and potential problems during recovery for the procedure that I have provided informed consent. Date: 01/09/2017; Time: 1:10 PM  Pre-Procedure Preparation:  Monitoring: As per clinic protocol. Respiration, ETCO2, SpO2, BP, heart rate and rhythm monitor placed and checked for adequate function Safety Precautions: Patient was assessed for positional comfort and pressure points before  starting the procedure. Time-out: I initiated and conducted the "Time-out" before starting the procedure, as per protocol. The patient was asked to participate by confirming the accuracy of the "Time Out" information. Verification of the correct person, site, and procedure were performed and confirmed by me, the nursing staff, and the patient. "Time-out" conducted as per Joint Commission's Universal Protocol (UP.01.01.01). "Time-out" Date & Time: 01/09/2017; 1103 hrs.  Description of Procedure Process:   Position: Prone Target Area: Caudal Epidural Canal. Approach: Midline approach. Area Prepped: Entire Posterior Sacrococcygeal Region Prepping solution: ChloraPrep (2% chlorhexidine gluconate and 70% isopropyl alcohol) Safety Precautions: Aspiration looking for blood return was conducted prior to all injections. At no point did we inject any substances, as a needle was being advanced. No attempts were made at seeking any paresthesias. Safe injection practices and needle disposal techniques used. Medications properly checked for expiration dates. SDV (single dose vial) medications used. Description of the Procedure: Protocol guidelines were followed. The patient was placed in position over the fluoroscopy table. The target area was identified and the area prepped in the usual manner. Skin desensitized using vapocoolant spray. Skin & deeper tissues infiltrated with local anesthetic. Appropriate amount of time allowed to pass for local anesthetics to take effect. The procedure needles were then advanced to the target area. Proper needle placement secured. Negative aspiration confirmed. Solution injected in intermittent fashion, asking for systemic symptoms every 0.5cc of injectate. The needles were then removed and the area cleansed, making sure to leave some of the prepping solution back to take advantage of its long term bactericidal properties. Start Time: 1104 hrs. End Time: 1115 hrs. Materials:   Needle(s) Type: Epidural needle Gauge: 20G Length: 3.5-in Medication(s): We administered fentaNYL, midazolam, iopamidol, triamcinolone acetonide, lidocaine (PF), sodium chloride flush, ropivacaine (PF) 2 mg/mL (0.2%), sodium chloride, and diphenhydrAMINE. Please see chart orders for dosing details.  Imaging Guidance (Spinal):  Type of Imaging Technique: Fluoroscopy Guidance (Spinal) Indication(s): Assistance in needle guidance and placement for procedures requiring needle placement in or near specific anatomical locations not  easily accessible without such assistance. Exposure Time: Please see nurses notes. Contrast: Before injecting any contrast, we confirmed that the patient did not have an allergy to iodine, shellfish, or radiological contrast. Once satisfactory needle placement was completed at the desired level, radiological contrast was injected. Contrast injected under live fluoroscopy. No contrast complications. See chart for type and volume of contrast used. Fluoroscopic Guidance: I was personally present during the use of fluoroscopy. "Tunnel Vision Technique" used to obtain the best possible view of the target area. Parallax error corrected before commencing the procedure. "Direction-depth-direction" technique used to introduce the needle under continuous pulsed fluoroscopy. Once target was reached, antero-posterior, oblique, and lateral fluoroscopic projection used confirm needle placement in all planes. Images permanently stored in EMR. Interpretation: I personally interpreted the imaging intraoperatively. Adequate needle placement confirmed in multiple planes. Appropriate spread of contrast into desired area was observed. No evidence of afferent or efferent intravascular uptake. No intrathecal or subarachnoid spread observed. Permanent images saved into the patient's record.   Diagnostic Epidurogram:  Contrast: Before injecting any contrast, we confirmed that the patient did not have an  allergy to iodine, shellfish, or radiological contrast. For accuracy purposes, contrast was injected under live fluoroscopy. Study personally interpreted intraoparatively. Type: Non-ionic, water soluble, hypoallergenic, myelogram-compatible, radiological contrast used. Please see orders and nurses note for specific choice of contrast. Volume: Please see nurses note for injected volume.  Observations:  Spinal Alignment: Adequate       Vertebral body: Intact Lamina: Intact Disc: Disc hight preserved Facet: Arthropathy  Moderate Hardware: Pedicle screw(s) between the L5 and S1 vertebral bodies, bilaterally.  Spread: Appropriate epidural spread of contrast Anterior: Adequate Posterior: Adequate Superior (cephalad): No flow of contrast past the mid vertebral body of L5 Inferior (caudad): Adequate Right lateral: Adequate Left lateral: Defect identified is to an S3 nerve roots. Plica medialis dorsalis: Not identifiable Nerve root sleeve: Adequate for the right L5, S1, S2, and S3. It there is a Clelia Croft to seen for the L5. The left S1 can be visualized but there is no evidence of flow towards the or S3 on the left side. In addition there is no flow of contrast above the mid lumbar to your body of L5. Epidural extravasation: None observed Intrathecal: No intrathecal spread identified Subarachnoid: No subarachnoid spread pattern observed Vascular: No evidence of afferent or efferent intravascular uptake  Impression: Technically successful epidurogram. See above for details. Evidence of epidural fibrosis for the left is 2 and S3 nerve roots as well as poor flow of contrast above the mid L5 vertebral body. These findings are suggestive of epidural fibrosis. Note: Hard copies saved to EMR.  Antibiotic Prophylaxis:  Indication(s): None identified Antibiotic given: None  Post-operative Assessment:  EBL: None Complications: No immediate post-treatment complications observed by team, or reported by  patient. Note: The patient tolerated the entire procedure well. A repeat set of vitals were taken after the procedure and the patient was kept under observation following institutional policy, for this type of procedure. Post-procedural neurological assessment was performed, showing return to baseline, prior to discharge. The patient was provided with post-procedure discharge instructions, including a section on how to identify potential problems. Should any problems arise concerning this procedure, the patient was given instructions to immediately contact us, at any time, without hesitation. In any case, we plan to contact the patient by telephone for a follow-up status report regarding this interventional procedure. Comments:  No additional relevant information.  Plan of Care  Disposition: Discharge home  Discharge Date & Time: 01/09/2017; 1215 hrs.  Physician-requested Follow-up:  Return in about 2 weeks (around 01/23/2017) for Post-Procedure evaluation.  Future Appointments Date Time Provider Department Center  01/15/2017 8:30 AM Delano Metz, MD [facility] None  02/13/2017 1:15 PM Delano Metz, MD [facility] None   Medications ordered for procedure: Meds ordered this encounter  Medications  . fentaNYL (SUBLIMAZE) injection 25-50 mcg    Make sure Narcan is available in the pyxis when using this medication. In the event of respiratory depression (RR< 8/min): Titrate NARCAN (naloxone) in increments of 0.1 to 0.2 mg IV at 2-3 minute intervals, until desired degree of reversal.  . lactated ringers infusion 1,000 mL  . midazolam (VERSED) 5 MG/5ML injection 1-2 mg    Make sure Flumazenil is available in the pyxis when using this medication. If oversedation occurs, administer 0.2 mg IV over 15 sec. If after 45 sec no response, administer 0.2 mg again over 1 min; may repeat at 1 min intervals; not to exceed 4 doses (1 mg)  . iopamidol (ISOVUE-M) 41 % intrathecal injection 10 mL  .  triamcinolone acetonide (KENALOG-40) injection 40 mg  . lidocaine (PF) (XYLOCAINE) 1 % injection 10 mL  . sodium chloride flush (NS) 0.9 % injection 2 mL  . ropivacaine (PF) 2 mg/mL (0.2%) (NAROPIN) injection 2 mL  . sodium chloride 0.9 % injection    Lowell Guitar, Patti: cabinet override  . diphenhydrAMINE (BENADRYL) 50 MG/ML injection    Vivia Ewing, Dena: cabinet override  . sodium chloride 0.9 % injection    Vivia Ewing, Dena: cabinet override   Medications administered: We administered fentaNYL, midazolam, iopamidol, triamcinolone acetonide, lidocaine (PF), sodium chloride flush, ropivacaine (PF) 2 mg/mL (0.2%), sodium chloride, and diphenhydrAMINE.  See the medical record for exact dosing, route, and time of administration.  Lab-work, Procedure(s), & Referral(s) Ordered: Orders Placed This Encounter  Procedures  . Caudal Epidural Injection  . DG C-Arm 1-60 Min-No Report  . Discharge instructions  . Follow-up  . Informed Consent Details: Transcribe to consent form and obtain patient signature  . Provider attestation of informed consent for procedure/surgical case  . Verify informed consent   Imaging Ordered: Results for orders placed in visit on 01/09/17  DG C-Arm 1-60 Min-No Report   Narrative There is no Radiologist interpretation  for this exam.   New Prescriptions   No medications on file   Primary Care Physician: Glori Luis, MD Location: [facility] Note by: Sydnee Levans. Laban Emperor, M.D, DABA, DABAPM, DABPM, DABIPP, FIPP Date: 01/09/2017; Time: 1:10 PM  Disclaimer:  Medicine is not an exact science. The only guarantee in medicine is that nothing is guaranteed. It is important to note that the decision to proceed with this intervention was based on the information collected from the patient. The Data and conclusions were drawn from the patient's questionnaire, the interview, and the physical examination. Because the information was provided in large  part by the patient, it cannot be guaranteed that it has not been purposely or unconsciously manipulated. Every effort has been made to obtain as much relevant data as possible for this evaluation. It is important to note that the conclusions that lead to this procedure are derived in large part from the available data. Always take into account that the treatment will also be dependent on availability of resources and existing treatment guidelines, considered by other Pain Management Practitioners as being common knowledge and practice, at the time of the intervention. For Medico-Legal purposes, it is also  important to point out that variation in procedural techniques and pharmacological choices are the acceptable norm. The indications, contraindications, technique, and results of the above procedure should only be interpreted and judged by a Board-Certified Interventional Pain Specialist with extensive familiarity and expertise in the same exact procedure and technique. Attempts at providing opinions without similar or greater experience and expertise than that of the treating physician will be considered as inappropriate and unethical, and shall result in a formal complaint to the state medical board and applicable specialty societies.  Instructions provided at this appointment: Patient Instructions  Please complete and return your Post Procedure Diary  Pain Management Discharge Instructions  General Discharge Instructions :  If you need to reach your doctor call: Monday-Friday 8:00 am - 4:00 pm at [phone] or toll free [phone].  After clinic hours [phone] to have operator reach doctor.  Bring all of your medication bottles to all your appointments in the pain clinic.  To cancel or reschedule your appointment with Pain Management please remember to call 24 hours in advance to avoid a fee.  Refer to the educational materials which you have been given on: General Risks, I had my  Procedure. Discharge Instructions, Post Sedation.  Post Procedure Instructions:  The drugs you were given will stay in your system until tomorrow, so for the next 24 hours you should not drive, make any legal decisions or drink any alcoholic beverages.  You may eat anything you prefer, but it is better to start with liquids then soups and crackers, and gradually work up to solid foods.  Please notify your doctor immediately if you have any unusual bleeding, trouble breathing or pain that is not related to your normal pain.  Depending on the type of procedure that was done, some parts of your body may feel week and/or numb.  This usually clears up by tonight or the next day.  Walk with the use of an assistive device or accompanied by an adult for the 24 hours.  You may use ice on the affected area for the first 24 hours.  Put ice in a Ziploc bag and cover with a towel and place against area 15 minutes on 15 minutes off.  You may switch to heat after 24 hours.

## 2017-01-09 NOTE — Patient Instructions (Signed)
Please complete and return your Post Procedure Diary  Pain Management Discharge Instructions  General Discharge Instructions :  If you need to reach your doctor call: Monday-Friday 8:00 am - 4:00 pm at [phone] or toll free [phone].  After clinic hours [phone] to have operator reach doctor.  Bring all of your medication bottles to all your appointments in the pain clinic.  To cancel or reschedule your appointment with Pain Management please remember to call 24 hours in advance to avoid a fee.  Refer to the educational materials which you have been given on: General Risks, I had my Procedure. Discharge Instructions, Post Sedation.  Post Procedure Instructions:  The drugs you were given will stay in your system until tomorrow, so for the next 24 hours you should not drive, make any legal decisions or drink any alcoholic beverages.  You may eat anything you prefer, but it is better to start with liquids then soups and crackers, and gradually work up to solid foods.  Please notify your doctor immediately if you have any unusual bleeding, trouble breathing or pain that is not related to your normal pain.  Depending on the type of procedure that was done, some parts of your body may feel week and/or numb.  This usually clears up by tonight or the next day.  Walk with the use of an assistive device or accompanied by an adult for the 24 hours.  You may use ice on the affected area for the first 24 hours.  Put ice in a Ziploc bag and cover with a towel and place against area 15 minutes on 15 minutes off.  You may switch to heat after 24 hours.

## 2017-01-09 NOTE — Progress Notes (Signed)
1126  Scratching entire body, c/o uncontrollable itching.  Dr. Dossie Arbour notified. 1133 No longer scratching as much. [place] to scratch skin. 1200  Feeling some better. Continues to scratch. 1209  Feeling much better. Much less scratching. States ready to go home.

## 2017-01-10 ENCOUNTER — Telehealth: Payer: Self-pay | Admitting: *Deleted

## 2017-01-10 NOTE — Telephone Encounter (Signed)
Called to speak with patient, message left for her to call office if there are any questions or concerns re; procedure on yesterday.

## 2017-01-15 ENCOUNTER — Encounter: Payer: Self-pay | Admitting: Pain Medicine

## 2017-01-15 ENCOUNTER — Ambulatory Visit: Payer: Medicare HMO | Attending: Pain Medicine | Admitting: Pain Medicine

## 2017-01-15 VITALS — BP 138/79 | HR 76 | Temp 98.4°F | Resp 16 | Ht 73.0 in

## 2017-01-15 DIAGNOSIS — M5442 Lumbago with sciatica, left side: Secondary | ICD-10-CM | POA: Diagnosis not present

## 2017-01-15 DIAGNOSIS — F1190 Opioid use, unspecified, uncomplicated: Secondary | ICD-10-CM

## 2017-01-15 DIAGNOSIS — M961 Postlaminectomy syndrome, not elsewhere classified: Secondary | ICD-10-CM

## 2017-01-15 DIAGNOSIS — Z9049 Acquired absence of other specified parts of digestive tract: Secondary | ICD-10-CM | POA: Insufficient documentation

## 2017-01-15 DIAGNOSIS — G894 Chronic pain syndrome: Secondary | ICD-10-CM | POA: Diagnosis not present

## 2017-01-15 DIAGNOSIS — M5416 Radiculopathy, lumbar region: Secondary | ICD-10-CM

## 2017-01-15 DIAGNOSIS — B9562 Methicillin resistant Staphylococcus aureus infection as the cause of diseases classified elsewhere: Secondary | ICD-10-CM

## 2017-01-15 DIAGNOSIS — E559 Vitamin D deficiency, unspecified: Secondary | ICD-10-CM | POA: Diagnosis not present

## 2017-01-15 DIAGNOSIS — M069 Rheumatoid arthritis, unspecified: Secondary | ICD-10-CM | POA: Insufficient documentation

## 2017-01-15 DIAGNOSIS — M792 Neuralgia and neuritis, unspecified: Secondary | ICD-10-CM

## 2017-01-15 DIAGNOSIS — Z79891 Long term (current) use of opiate analgesic: Secondary | ICD-10-CM

## 2017-01-15 DIAGNOSIS — G4700 Insomnia, unspecified: Secondary | ICD-10-CM | POA: Insufficient documentation

## 2017-01-15 DIAGNOSIS — Z96643 Presence of artificial hip joint, bilateral: Secondary | ICD-10-CM | POA: Diagnosis not present

## 2017-01-15 DIAGNOSIS — I10 Essential (primary) hypertension: Secondary | ICD-10-CM | POA: Diagnosis not present

## 2017-01-15 DIAGNOSIS — K859 Acute pancreatitis without necrosis or infection, unspecified: Secondary | ICD-10-CM | POA: Diagnosis not present

## 2017-01-15 DIAGNOSIS — F419 Anxiety disorder, unspecified: Secondary | ICD-10-CM | POA: Insufficient documentation

## 2017-01-15 DIAGNOSIS — E876 Hypokalemia: Secondary | ICD-10-CM | POA: Diagnosis not present

## 2017-01-15 DIAGNOSIS — R002 Palpitations: Secondary | ICD-10-CM | POA: Diagnosis not present

## 2017-01-15 DIAGNOSIS — L0390 Cellulitis, unspecified: Secondary | ICD-10-CM

## 2017-01-15 DIAGNOSIS — M79605 Pain in left leg: Secondary | ICD-10-CM | POA: Diagnosis not present

## 2017-01-15 DIAGNOSIS — Z9889 Other specified postprocedural states: Secondary | ICD-10-CM | POA: Diagnosis not present

## 2017-01-15 DIAGNOSIS — Z79899 Other long term (current) drug therapy: Secondary | ICD-10-CM | POA: Diagnosis not present

## 2017-01-15 DIAGNOSIS — M797 Fibromyalgia: Secondary | ICD-10-CM | POA: Diagnosis not present

## 2017-01-15 DIAGNOSIS — F329 Major depressive disorder, single episode, unspecified: Secondary | ICD-10-CM | POA: Diagnosis not present

## 2017-01-15 DIAGNOSIS — G8929 Other chronic pain: Secondary | ICD-10-CM

## 2017-01-15 DIAGNOSIS — K5900 Constipation, unspecified: Secondary | ICD-10-CM | POA: Diagnosis not present

## 2017-01-15 DIAGNOSIS — K219 Gastro-esophageal reflux disease without esophagitis: Secondary | ICD-10-CM | POA: Insufficient documentation

## 2017-01-15 DIAGNOSIS — M0579 Rheumatoid arthritis with rheumatoid factor of multiple sites without organ or systems involvement: Secondary | ICD-10-CM

## 2017-01-15 DIAGNOSIS — J209 Acute bronchitis, unspecified: Secondary | ICD-10-CM | POA: Diagnosis not present

## 2017-01-15 DIAGNOSIS — M7918 Myalgia, other site: Secondary | ICD-10-CM

## 2017-01-15 DIAGNOSIS — M791 Myalgia: Secondary | ICD-10-CM

## 2017-01-15 MED ORDER — CYCLOBENZAPRINE HCL 10 MG PO TABS: 10.0000 mg | ORAL_TABLET | Freq: Three times a day (TID) | ORAL | 0 refills | Status: DC | PRN

## 2017-01-15 MED ORDER — GABAPENTIN 100 MG PO CAPS: 100.0000 mg | ORAL_CAPSULE | Freq: Every day | ORAL | 0 refills | Status: DC

## 2017-01-15 NOTE — Progress Notes (Signed)
Patient's Name: Maria Hamilton  MRN: [id number]  Referring Provider: Glori Luis, MD  DOB: 1951/02/15  PCP: Glori Luis, MD  DOS: 01/15/2017  Note by: Sydnee Levans. Laban Emperor, MD  Service setting: Ambulatory outpatient  Specialty: Interventional Pain Management  Location: [facility] (AMB) Pain Management Facility    Patient type: Established   Primary Reason(s) for Visit: Encounter for prescription drug management & post-procedure evaluation of chronic illness with mild to moderate exacerbation(Level of risk: moderate) CC: No chief complaint on file.  HPI  Maria Hamilton is a 66 y.o. year old, female patient, who comes today for a post-procedure evaluation and medication management. Maria Hamilton has GERD (gastroesophageal reflux disease); Rheumatoid factor positive; Fatigue; Vitamin D deficiency; Insomnia; Fibromyalgia; Chronic low back pain (Location of Primary Source of Pain) (Left); Chronic pain syndrome; Depression; Hypertension; Cellulitis due to MRSA; 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); Long term current use of opiate analgesic; Long term prescription opiate use; Opiate use; Encounter for therapeutic drug level monitoring; Encounter for pain management planning; Rheumatoid arthritis, multiple sites (positive RF); Chronic lower extremity pain (Location of Secondary source of pain) (Left); Lumbar facet arthropathy (Bilateral); Failed back surgical syndrome (x 3); Epidural fibrosis; Neurogenic pain; Musculoskeletal pain; Chronic lumbar radicular pain (L5/S1 dermatome) (Location of Secondary source of pain) (Left); History of total hip replacement, bilateral; Hypokalemia; Cholecystitis; Constipation; Therapeutic opioid-induced constipation (OIC); Palpitations; and Epigastric pain on her problem list. Her primarily concern today is the No chief complaint on file.  Pain Assessment: Self-Reported Pain Score: 5 /10 Clinically the patient looks like a 3/10 Reported level is inconsistent  with clinical observations. Information on the proper use of the pain scale provided to the patient today Pain Type: Chronic pain Pain Location: Back Pain Orientation: Left, Lower Pain Descriptors / Indicators: Aching, Constant, Throbbing, Sore Pain Frequency: Constant  Maria Hamilton was last seen on 01/09/2017 for a procedure. During today's appointment we reviewed Maria Hamilton post-procedure results, as well as her outpatient medication regimen. Unfortunately, the patient just broke arm medication agreement by getting Tylenol 3 from MENSHEW, Charlesetta Ivory, MD  Further details on both, my assessment(s), as well as the proposed treatment plan, please see below.  Controlled Substance Pharmacotherapy Assessment REMS (Risk Evaluation and Mitigation Strategy)  Analgesic:Oxycodone/APAP 5/325 one tablet every 4 hours (30 mg/dayof oxycodone) MME/day:45mg /day Maria Hamilton, Maria Hamilton  01/15/2017  8:58 AM  Sign at close encounter Nursing Pain Medication Assessment:  Safety precautions to be maintained throughout the outpatient stay will include: orient to surroundings, keep bed in low position, maintain call bell within reach at all times, provide assistance with transfer out of bed and ambulation.  Medication Inspection Compliance: Pill count conducted under aseptic conditions, in front of the patient. Neither the pills nor the bottle was removed from the patient's sight at any time. Once count was completed pills were immediately returned to the patient in their original bottle. Pill Count: 9 of 12 pills remain Bottle Appearance: Standard pharmacy container. Clearly labeled. Medication: Tylenol #3 Filled Date: 01 / 14 / 2018  Did not Bring Tramadol brought all other meds today   Pharmacokinetics: Liberation and absorption (onset of action): WNL Distribution (time to peak effect): WNL Metabolism and excretion (duration of action): WNL         Pharmacodynamics: Desired effects: Analgesia: [name] reports >50% benefit. Functional ability: Patient reports that medication allows her to accomplish basic ADLs Clinically meaningful improvement in function (CMIF): Sustained CMIF goals met Perceived effectiveness: Described as relatively  effective, allowing for increase in activities of daily living (ADL) Undesirable effects: Side-effects or Adverse reactions: None reported Monitoring: Maria Hamilton: Online review of the past 69-month period conducted. Compliant with practice rules and regulations List of all UDS test(s) done:  Lab Results  Component Value Date   SUMMARY FINAL 09/24/2016   Last UDS on record: No results found for: TOXASSSELUR UDS interpretation: Compliant          Medication Assessment Form: Reviewed. Patient indicates being compliant with therapy Treatment compliance: Compliant Risk Assessment Profile: Aberrant behavior: See prior evaluations. None observed or detected today Comorbid factors increasing risk of overdose: See prior notes. No additional risks detected today Risk of substance use disorder (SUD): Low Opioid Risk Tool (ORT) Total Score: 3  Interpretation Table:  Score <3 = Low Risk for SUD  Score between 4-7 = Moderate Risk for SUD  Score >8 = High Risk for Opioid Abuse   Risk Mitigation Strategies:  Patient Counseling: Covered Patient-Prescriber Agreement (PPA): Present and active  Notification to other healthcare providers: Done  Pharmacologic Plan: No change in therapy, at this time  Post-Procedure Assessment  01/09/2017 Procedure: Left caudal epidural steroid injection + epidurogram Under fluoroscopic guidance and IV sedation Post-procedure pain score: 4/10 On the day of the procedure the patient indicated that her pain score was a 5/10. Upon leaving the clinic after the procedure Maria Hamilton was asked with her pain score was and Maria Hamilton indicated that it was a 4/10, suggesting a 20% improvement. Influential Factors: BMI:   Intra-procedural challenges:  None observed Assessment challenges: Results reported today are inconsistent with those reported on procedure day, immediately before discharge. Lack of understanding of post-procedural reporting, despite initial explanations Post-procedural side-effects, adverse reactions, or complications: None reported Reported issues: None  Sedation: Sedation provided. When no sedatives are used, the analgesic levels obtained are directly associated to the effectiveness of the local anesthetics. However, when sedation is provided, the level of analgesia obtained during the initial 1 hour following the intervention, is believed to be the result of a combination of factors. These factors may include, but are not limited to: 1. The effectiveness of the local anesthetics used. 2. The effects of the analgesic(s) and/or anxiolytic(s) used. 3. The degree of discomfort experienced by the patient at the time of the procedure. 4. The patients ability and reliability in recalling and recording the events. 5. The presence and influence of possible secondary gains and/or psychosocial factors. Reported result: Relief experienced during the 1st hour after the procedure: 100 % (Ultra-Short Term Relief) Interpretative annotation: Inaccurate and unreliable report. Patient does not appear to have understood instructions on differential evaluation of treated vs untreated area, leading to an inaccurate global report  Effects of local anesthetic: The analgesic effects attained during this period are directly associated to the localized infiltration of local anesthetics and therefore cary significant diagnostic value as to the etiological location, or anatomical origin, of the pain. Expected duration of relief is directly dependent on the pharmacodynamics of the local anesthetic used. Long-acting (4-6 hours) anesthetics used.  Reported result: Relief during the next 4 to 6 hour after the procedure: 0 % (Short-Term  Relief) Interpretative annotation: Inaccurate and unreliable report. Patient does not appear to have understood instructions on differential evaluation of treated vs untreated area, leading to an inaccurate global report  Long-term benefit: Defined as the period of time past the expected duration of local anesthetics. With the possible exception of prolonged sympathetic blockade from the local anesthetics, benefits during  this period are typically attributed to, or associated with, other factors such as analgesic sensory neuropraxia, antiinflammatory effects, or beneficial biochemical changes provided by agents other than the local anesthetics Reported result: Extended relief following procedure: 0 % (states only lasted 3 hours) (Long-Term Relief) Interpretative annotation: No long-term benefit. This could suggest limited inflammatory component to the pain with possible mechanical aggravating factors.          Current benefits: Defined as persistent relief that continues at this point in time.   Reported results: Treated area: 0 %       Interpretative annotation: Recurrance of symptoms. This would suggest persistent aggravating factors  Interpretation: Results would suggest lack of understanding involving the evaluation process versus lack of interest.          Laboratory Chemistry  Inflammation Markers Lab Results  Component Value Date   ESRSEDRATE 39 (H) 09/25/2016   CRP <0.8 09/25/2016   Renal Function Lab Results  Component Value Date   BUN 7 11/26/2016   CREATININE 0.71 11/26/2016   GFRAA >60 11/15/2016   GFRNONAA >60 11/15/2016   Hepatic Function Lab Results  Component Value Date   AST 17 11/26/2016   ALT 15 11/26/2016   ALBUMIN 3.7 11/26/2016   Electrolytes Lab Results  Component Value Date   NA 137 11/26/2016   K 3.4 (L) 11/26/2016   CL 99 11/26/2016   CALCIUM 9.3 11/26/2016   MG 1.9 09/25/2016   Pain Modulating Vitamins Lab Results  Component Value Date    25OHVITD1 14 (L) 09/25/2016   25OHVITD2 6.4 09/25/2016   25OHVITD3 7.7 09/25/2016   VITAMINB12 269 09/25/2016   Coagulation Parameters Lab Results  Component Value Date   PLT 329.0 11/26/2016   Cardiovascular Lab Results  Component Value Date   HGB 11.8 (L) 11/26/2016   HCT 36.2 11/26/2016   Note: Lab results reviewed.  Recent Diagnostic Imaging Review  Dg C-arm 1-60 Min-no Report  Result Date: 01/09/2017 There is no Radiologist interpretation  for this exam.  Note: Imaging results reviewed.          Meds  The patient has a current medication list which includes the following prescription(s): acetaminophen-codeine, amlodipine, benzonatate, cyclobenzaprine, escitalopram, folic acid, gabapentin, hydrochlorothiazide, ondansetron, polyethylene glycol, tramadol, and vitamin b-12.  Current Outpatient Prescriptions on File Prior to Visit  Medication Sig  . acetaminophen-codeine (TYLENOL #3) 300-30 MG tablet Take 1 tablet by mouth every 8 (eight) hours as needed for moderate pain.  Marland Kitchen amLODipine (NORVASC) 5 MG tablet Take 1 tablet (5 mg total) by mouth daily.  . benzonatate (TESSALON PERLES) 100 MG capsule Take 1-2 tabs TID  . escitalopram (LEXAPRO) 20 MG tablet Take 1 tablet (20 mg total) by mouth daily.  . folic acid (FOLVITE) 1 MG tablet Take 1 tablet by mouth 1 day or 1 dose.  . hydrochlorothiazide (HYDRODIURIL) 25 MG tablet Take 1 tablet (25 mg total) by mouth daily.  . polyethylene glycol (MIRALAX / GLYCOLAX) packet Take 17 g by mouth daily.  . traMADol (ULTRAM) 50 MG tablet Take 1-2 tablets (50-100 mg total) by mouth every 6 (six) hours as needed.   No current facility-administered medications on file prior to visit.    ROS  Constitutional: Denies any fever or chills Gastrointestinal: No reported hemesis, hematochezia, vomiting, or acute GI distress Musculoskeletal: Denies any acute onset joint swelling, redness, loss of ROM, or weakness Neurological: No reported episodes  of acute onset apraxia, aphasia, dysarthria, agnosia, amnesia, paralysis, loss of coordination, or  loss of consciousness  Allergies  Maria Hamilton is allergic to ace inhibitors; ciprofloxacin; fentanyl; morphine and related; penicillin g benzathine; and tape.  PFSH  Drug: Maria Hamilton  reports that Maria Hamilton does not use drugs. Alcohol:  reports that Maria Hamilton does not drink alcohol. Tobacco:  reports that Maria Hamilton has never smoked. Maria Hamilton has never used smokeless tobacco. Medical:  has a past medical history of Acute anxiety (08/03/2015); Acute bronchitis (12/31/2016); Acute pancreatitis (10/14/2016); Altered mental status (10/12/2016); Anxiety; Chronic hip pain; Chronic pain syndrome; Depression; Edema; Fatigue; Fibromyalgia; GERD (gastroesophageal reflux disease); Headache; Hypertension; Insomnia; Low back pain; Right upper quadrant abdominal pain; and Vitamin D deficiency. Family: family history includes Alcohol abuse in her father; Cancer in her father; Heart disease in her mother.  Past Surgical History:  Procedure Laterality Date  . ABDOMINAL HYSTERECTOMY    . BACK SURGERY    . BREAST SURGERY    . CHOLECYSTECTOMY N/A 10/16/2016   Procedure: LAPAROSCOPIC CHOLECYSTECTOMY WITH INTRAOPERATIVE CHOLANGIOGRAM;  Surgeon: Tiney Rouge III, MD;  Location: [facility] ORS;  Service: General;  Laterality: N/A;  . cyst removal from wrist    . ECTOPIC PREGNANCY SURGERY    . HIP SURGERY     x4   Constitutional Exam  General appearance: Well nourished, well developed, and well hydrated. In no apparent acute distress Vitals:   01/15/17 0838  BP: 138/79  Pulse: 76  Resp: 16  Temp: 98.4 F (36.9 C)  SpO2: 100%  Height: 6\' 1"  (1.854 m)   BMI Assessment: Estimated body mass index is 28.63 kg/m as calculated from the following:   Height as of 01/09/17: 6\' 1"  (1.854 m).   Weight as of 01/09/17: 217 lb (98.4 kg).  BMI interpretation table: BMI level Category Range association with higher incidence of chronic pain  <18 kg/m2  Underweight   18.5-24.9 kg/m2 Ideal body weight   25-29.9 kg/m2 Overweight Increased incidence by 20%  30-34.9 kg/m2 Obese (Class I) Increased incidence by 68%  35-39.9 kg/m2 Severe obesity (Class II) Increased incidence by 136%  >40 kg/m2 Extreme obesity (Class III) Increased incidence by 254%   BMI Readings from Last 4 Encounters:  01/09/17 28.63 kg/m  12/31/16 26.47 kg/m  12/29/16 27.18 kg/m  11/26/16 27.94 kg/m   Wt Readings from Last 4 Encounters:  01/09/17 217 lb (98.4 kg)  12/31/16 200 lb 9.6 oz (91 kg)  12/29/16 206 lb (93.4 kg)  11/26/16 211 lb 12.8 oz (96.1 kg)  Psych/Mental status: Alert, oriented x 3 (person, place, & time)       Eyes: PERLA Respiratory: No evidence of acute respiratory distress  Cervical Spine Exam  Inspection: No masses, redness, or swelling Alignment: Symmetrical Functional ROM: Unrestricted ROM Stability: No instability detected Muscle strength & Tone: Functionally intact Sensory: Unimpaired Palpation: Non-contributory  Upper Extremity (UE) Exam    Side: Right upper extremity  Side: Left upper extremity  Inspection: No masses, redness, swelling, or asymmetry  Inspection: No masses, redness, swelling, or asymmetry  Functional ROM: Unrestricted ROM          Functional ROM: Unrestricted ROM          Muscle strength & Tone: Functionally intact  Muscle strength & Tone: Functionally intact  Sensory: Unimpaired  Sensory: Unimpaired  Palpation: Non-contributory  Palpation: Non-contributory   Thoracic Spine Exam  Inspection: No masses, redness, or swelling Alignment: Symmetrical Functional ROM: Unrestricted ROM Stability: No instability detected Sensory: Unimpaired Muscle strength & Tone: Functionally intact Palpation: Non-contributory  Lumbar Spine Exam  Inspection:  No masses, redness, or swelling Alignment: Symmetrical Functional ROM: Unrestricted ROM Stability: No instability detected Muscle strength & Tone: Functionally  intact Sensory: Unimpaired Palpation: Non-contributory Provocative Tests: Lumbar Hyperextension and rotation test: evaluation deferred today       Patrick's Maneuver: evaluation deferred today              Gait & Posture Assessment  Ambulation: Unassisted Gait: Relatively normal for age and body habitus Posture: WNL   Lower Extremity Exam    Side: Right lower extremity  Side: Left lower extremity  Inspection: No masses, redness, swelling, or asymmetry  Inspection: No masses, redness, swelling, or asymmetry  Functional ROM: Unrestricted ROM          Functional ROM: Unrestricted ROM          Muscle strength & Tone: Functionally intact  Muscle strength & Tone: Functionally intact  Sensory: Unimpaired  Sensory: Unimpaired  Palpation: Non-contributory  Palpation: Non-contributory   Assessment  Primary Diagnosis & Pertinent Problem List: The primary encounter diagnosis was Chronic low back pain (Location of Primary Source of Pain) (Left). Diagnoses of Chronic lumbar radicular pain (L5/S1 dermatome) (Location of Secondary source of pain) (Left), Chronic lower extremity pain (Location of Secondary source of pain) (Left), Chronic pain syndrome, Failed back surgical syndrome (x 3), Fibromyalgia, Musculoskeletal pain, Neurogenic pain, Rheumatoid arthritis, multiple sites (positive RF), Long term current use of opiate analgesic, Opiate use, and Cellulitis due to MRSA were also pertinent to this visit.  Status Diagnosis  Controlled Controlled Controlled 1. Chronic low back pain (Location of Primary Source of Pain) (Left)   2. Chronic lumbar radicular pain (L5/S1 dermatome) (Location of Secondary source of pain) (Left)   3. Chronic lower extremity pain (Location of Secondary source of pain) (Left)   4. Chronic pain syndrome   5. Failed back surgical syndrome (x 3)   6. Fibromyalgia   7. Musculoskeletal pain   8. Neurogenic pain   9. Rheumatoid arthritis, multiple sites (positive RF)   10. Long  term current use of opiate analgesic   11. Opiate use   12. Cellulitis due to MRSA      Plan of Care  Pharmacotherapy (Medications Ordered): Meds ordered this encounter  Medications  . gabapentin (NEURONTIN) 100 MG capsule    Sig: Take 1-3 capsules (100-300 mg total) by mouth at bedtime. Follow titration schedule.    Dispense:  90 capsule    Refill:  0    Do not place this medication, or any other prescription from our practice, on "Automatic Refill". Patient may have prescription filled one day early if pharmacy is closed on scheduled refill date.  . cyclobenzaprine (FLEXERIL) 10 MG tablet    Sig: Take 1 tablet (10 mg total) by mouth 3 (three) times daily as needed for muscle spasms.    Dispense:  270 tablet    Refill:  0    Do not place this medication, or any other prescription from our practice, on "Automatic Refill". Patient may have prescription filled one day early if pharmacy is closed on scheduled refill date.   New Prescriptions   GABAPENTIN (NEURONTIN) 100 MG CAPSULE    Take 1-3 capsules (100-300 mg total) by mouth at bedtime. Follow titration schedule.   Medications administered today: Ms. Villena had no medications administered during this visit. Lab-work, procedure(s), and/or referral(s): Orders Placed This Encounter  Procedures  . Caudal Epidural Injection  . ToxASSURE Select 13 (MW), Urine   Imaging and/or referral(s): None  Interventional therapies:  Planned, scheduled, and/or pending:   Diagnostic left sided caudal epidural steroid injection   Considering:   Diagnostic left L5-S1 transforaminal epidural steroid injection.  Diagnostic left sided caudal epidural steroid injection +epidurogram. Possible RACZepidurolysis of adhesions. Possible bilateral Lumbarspinal core stability or trial/implant.  Intrathecal pump trial.    Palliative PRN treatment(s):   None at this time   Provider-requested follow-up: Return in about 1 month (around 02/13/2017) for  (MD) Med-Mgmt, in addition, procedure (ASAA).  Future Appointments Date Time Provider Department Center  01/21/2017 10:00 AM Delano Metz, MD [facility] None  02/13/2017 1:15 PM Delano Metz, MD [facility] None   Primary Care Physician: Glori Luis, MD Location: [facility] Outpatient [facility] Management Facility Note by: Sydnee Levans. Laban Emperor, M.D, DABA, DABAPM, DABPM, DABIPP, FIPP Date: 01/15/2017; Time: 10:20 AM  Pain Score Disclaimer: We use the NRS-11 scale. This is a self-reported, subjective measurement of pain severity with only modest accuracy. It is used primarily to identify changes within a particular patient. It must be understood that outpatient pain scales are significantly less accurate that those used for research, where they can be applied under ideal controlled circumstances with minimal exposure to variables. In reality, the score is likely to be a combination of pain intensity and pain affect, where pain affect describes the degree of emotional arousal or changes in action readiness caused by the sensory experience of pain. Factors such as social and work situation, setting, emotional state, anxiety levels, expectation, and prior pain experience may influence pain perception and show large inter-individual differences that may also be affected by time variables.  Patient instructions provided during this appointment: Patient Instructions   Initial Gabapentin Titration  Medication used: Gabapentin (Generic Name) or Neurontin (Brand Name) 100 mg tablets/capsules  Reasons to stop increasing the dose:  Reason 1: You get good relief of symptoms, in which case there is no need to increase the daily dose any further.    Reason 2: You develop some side effects, such as sleeping all of the time, difficulty concentrating, or becoming disoriented, in which case you need to go down on the dose, to the prior level, where you were not experiencing any side effects. Stay on that dose  longer, to allow more time for your body to get use it, before attempting to increase it again.   Steps: Step 1: Start by taking 1 (one) tablet at bedtime x 7 (seven) days.  Step 2: After being on 1 (one) tablet for 7 (seven) days, then increase it to 2 (two) tablets at bedtime for another 7 (seven) days.  Step 3: Next, after being on 2 (two) tablets at bedtime for 7 (seven) days, then increase it to 3 (three) tablets at bedtime, and stay on that dose until you see your doctor.  Reasons to stop increasing the dose: Reason 1: You get good relief of symptoms, in which case there is no need to increase the daily dose any further.  Reason 2: You develop some side effects, such as sleeping all of the time, difficulty concentrating, or becoming disoriented, in which case you need to go down on the dose, to the prior level, where you were not experiencing any side effects. Stay on that dose longer, to allow more time for your body to get use it, before attempting to increase it again.  Endpoint: Once you have reached the maximum dose you can tolerate without side-effects, contact your physician so as to evaluate the results of the regimen.   Questions: Feel  free to contact us for any questions or problems at [phone] Management Discharge Instructions  General Discharge Instructions :  If you need to reach your doctor call: Monday-Friday 8:00 am - 4:00 pm at [phone] or toll free [phone].  After clinic hours [phone] to have operator reach doctor.  Bring all of your medication bottles to all your appointments in the pain clinic.  To cancel or reschedule your appointment with Pain Management please remember to call 24 hours in advance to avoid a fee.  Refer to the educational materials which you have been given on: General Risks, I had my Procedure. Discharge Instructions, Post Sedation.  Post Procedure Instructions:  The drugs you were given will stay in your system  until tomorrow, so for the next 24 hours you should not drive, make any legal decisions or drink any alcoholic beverages.  You may eat anything you prefer, but it is better to start with liquids then soups and crackers, and gradually work up to solid foods.  Please notify your doctor immediately if you have any unusual bleeding, trouble breathing or pain that is not related to your normal pain.  Depending on the type of procedure that was done, some parts of your body may feel week and/or numb.  This usually clears up by tonight or the next day.  Walk with the use of an assistive device or accompanied by an adult for the 24 hours.  You may use ice on the affected area for the first 24 hours.  Put ice in a Ziploc bag and cover with a towel and place against area 15 minutes on 15 minutes off.  You may switch to heat after 24 hours. Epidural Steroid Injection An epidural steroid injection is a shot of steroid medicine and numbing medicine that is given into the space between the spinal cord and the bones in your back (epidural space). The shot helps relieve pain caused by an irritated or swollen nerve root. The amount of pain relief you get from the injection depends on what is causing the nerve to be swollen and irritated, and how long your pain lasts. You are more likely to benefit from this injection if your pain is strong and comes on suddenly rather than if you have had pain for a long time. Tell a health care provider about:  Any allergies you have.  All medicines you are taking, including vitamins, herbs, eye drops, creams, and over-the-counter medicines.  Any problems you or family members have had with anesthetic medicines.  Any blood disorders you have.  Any surgeries you have had.  Any medical conditions you have.  Whether you are pregnant or may be pregnant. What are the risks? Generally, this is a safe procedure. However, problems may occur,  including:  Headache.  Bleeding.  Infection.  Allergic reaction to medicines.  Damage to your nerves. What happens before the procedure? Staying hydrated  Follow instructions from your health care provider about hydration, which may include:  Up to 2 hours before the procedure - you may continue to drink clear liquids, such as water, clear fruit juice, black coffee, and plain tea. Eating and drinking restrictions  Follow instructions from your health care provider about eating and drinking, which may include:  8 hours before the procedure - stop eating heavy meals or foods such as meat, fried foods, or fatty foods.  6 hours before the procedure - stop eating light meals or foods, such as toast or cereal.  6 hours  before the procedure - stop drinking milk or drinks that contain milk.  2 hours before the procedure - stop drinking clear liquids. Medicine  You may be given medicines to lower anxiety.  Ask your health care provider about:  Changing or stopping your regular medicines. This is especially important if you are taking diabetes medicines or blood thinners.  Taking medicines such as aspirin and ibuprofen. These medicines can thin your blood. Do not take these medicines before your procedure if your health care provider instructs you not to. General instructions  Plan to have someone take you home from the hospital or clinic. What happens during the procedure?  You may receive a medicine to help you relax (sedative).  You will be asked to lie on your abdomen.  The injection site will be cleaned.  A numbing medicine (local anesthetic) will be used to numb the injection site.  A needle will be inserted through your skin into the epidural space. You may feel some discomfort when this happens. An X-ray machine will be used to make sure the needle is put as close as possible to the affected nerve.  A steroid medicine and a local anesthetic will be injected into the  epidural space.  The needle will be removed.  A bandage (dressing) will be put over the injection site. What happens after the procedure?  Your blood pressure, heart rate, breathing rate, and blood oxygen level will be monitored until the medicines you were given have worn off.  Your arm or leg may feel weak or numb for a few hours.  The injection site may feel sore.  Do not drive for 24 hours if you received a sedative. This information is not intended to replace advice given to you by your health care provider. Make sure you discuss any questions you have with your health care provider. Document Released: 03/11/2008 Document Revised: 05/16/2016 Document Reviewed: 03/20/2016 Elsevier Interactive Patient Education  2017 [employer]  Epidural Steroid Injection An epidural steroid injection is a shot of steroid medicine and numbing medicine that is given into the space between the spinal cord and the bones in your back (epidural space). The shot helps relieve pain caused by an irritated or swollen nerve root. The amount of pain relief you get from the injection depends on what is causing the nerve to be swollen and irritated, and how long your pain lasts. You are more likely to benefit from this injection if your pain is strong and comes on suddenly rather than if you have had pain for a long time. Tell a health care provider about:  Any allergies you have.  All medicines you are taking, including vitamins, herbs, eye drops, creams, and over-the-counter medicines.  Any problems you or family members have had with anesthetic medicines.  Any blood disorders you have.  Any surgeries you have had.  Any medical conditions you have.  Whether you are pregnant or may be pregnant. What are the risks? Generally, this is a safe procedure. However, problems may occur, including:  Headache.  Bleeding.  Infection.  Allergic reaction to medicines.  Damage to your nerves. What  happens before the procedure? Staying hydrated  Follow instructions from your health care provider about hydration, which may include:  Up to 2 hours before the procedure - you may continue to drink clear liquids, such as water, clear fruit juice, black coffee, and plain tea. Eating and drinking restrictions  Follow instructions from your health care provider about eating and  drinking, which may include:  8 hours before the procedure - stop eating heavy meals or foods such as meat, fried foods, or fatty foods.  6 hours before the procedure - stop eating light meals or foods, such as toast or cereal.  6 hours before the procedure - stop drinking milk or drinks that contain milk.  2 hours before the procedure - stop drinking clear liquids. Medicine  You may be given medicines to lower anxiety.  Ask your health care provider about:  Changing or stopping your regular medicines. This is especially important if you are taking diabetes medicines or blood thinners.  Taking medicines such as aspirin and ibuprofen. These medicines can thin your blood. Do not take these medicines before your procedure if your health care provider instructs you not to. General instructions  Plan to have someone take you home from the hospital or clinic. What happens during the procedure?  You may receive a medicine to help you relax (sedative).  You will be asked to lie on your abdomen.  The injection site will be cleaned.  A numbing medicine (local anesthetic) will be used to numb the injection site.  A needle will be inserted through your skin into the epidural space. You may feel some discomfort when this happens. An X-ray machine will be used to make sure the needle is put as close as possible to the affected nerve.  A steroid medicine and a local anesthetic will be injected into the epidural space.  The needle will be removed.  A bandage (dressing) will be put over the injection site. What  happens after the procedure?  Your blood pressure, heart rate, breathing rate, and blood oxygen level will be monitored until the medicines you were given have worn off.  Your arm or leg may feel weak or numb for a few hours.  The injection site may feel sore.  Do not drive for 24 hours if you received a sedative. This information is not intended to replace advice given to you by your health care provider. Make sure you discuss any questions you have with your health care provider. Document Released: 03/11/2008 Document Revised: 05/16/2016 Document Reviewed: 03/20/2016 Elsevier Interactive Patient Education  2017 [employer].

## 2017-01-15 NOTE — Progress Notes (Signed)
Nursing Pain Medication Assessment:  Safety precautions to be maintained throughout the outpatient stay will include: orient to surroundings, keep bed in low position, maintain call bell within reach at all times, provide assistance with transfer out of bed and ambulation.  Medication Inspection Compliance: Pill count conducted under aseptic conditions, in front of the patient. Neither the pills nor the bottle was removed from the patient's sight at any time. Once count was completed pills were immediately returned to the patient in their original bottle. Pill Count: 9 of 12 pills remain Bottle Appearance: Standard pharmacy container. Clearly labeled. Medication: Tylenol #3 Filled Date: 01 / 14 / 2018  Did not Bring Tramadol brought all other meds today

## 2017-01-15 NOTE — Patient Instructions (Addendum)
Initial Gabapentin Titration  Medication used: Gabapentin (Generic Name) or Neurontin (Brand Name) 100 mg tablets/capsules  Reasons to stop increasing the dose:  Reason 1: You get good relief of symptoms, in which case there is no need to increase the daily dose any further.    Reason 2: You develop some side effects, such as sleeping all of the time, difficulty concentrating, or becoming disoriented, in which case you need to go down on the dose, to the prior level, where you were not experiencing any side effects. Stay on that dose longer, to allow more time for your body to get use it, before attempting to increase it again.   Steps: Step 1: Start by taking 1 (one) tablet at bedtime x 7 (seven) days.  Step 2: After being on 1 (one) tablet for 7 (seven) days, then increase it to 2 (two) tablets at bedtime for another 7 (seven) days.  Step 3: Next, after being on 2 (two) tablets at bedtime for 7 (seven) days, then increase it to 3 (three) tablets at bedtime, and stay on that dose until you see your doctor.  Reasons to stop increasing the dose: Reason 1: You get good relief of symptoms, in which case there is no need to increase the daily dose any further.  Reason 2: You develop some side effects, such as sleeping all of the time, difficulty concentrating, or becoming disoriented, in which case you need to go down on the dose, to the prior level, where you were not experiencing any side effects. Stay on that dose longer, to allow more time for your body to get use it, before attempting to increase it again.  Endpoint: Once you have reached the maximum dose you can tolerate without side-effects, contact your physician so as to evaluate the results of the regimen.   Questions: Feel free to contact us for any questions or problems at [phone] Management Discharge Instructions  General Discharge Instructions :  If you need to reach your doctor call: Monday-Friday 8:00 am - 4:00 pm  at [phone] or toll free [phone].  After clinic hours [phone] to have operator reach doctor.  Bring all of your medication bottles to all your appointments in the pain clinic.  To cancel or reschedule your appointment with Pain Management please remember to call 24 hours in advance to avoid a fee.  Refer to the educational materials which you have been given on: General Risks, I had my Procedure. Discharge Instructions, Post Sedation.  Post Procedure Instructions:  The drugs you were given will stay in your system until tomorrow, so for the next 24 hours you should not drive, make any legal decisions or drink any alcoholic beverages.  You may eat anything you prefer, but it is better to start with liquids then soups and crackers, and gradually work up to solid foods.  Please notify your doctor immediately if you have any unusual bleeding, trouble breathing or pain that is not related to your normal pain.  Depending on the type of procedure that was done, some parts of your body may feel week and/or numb.  This usually clears up by tonight or the next day.  Walk with the use of an assistive device or accompanied by an adult for the 24 hours.  You may use ice on the affected area for the first 24 hours.  Put ice in a Ziploc bag and cover with a towel and place against area 15 minutes on 15 minutes off.  You  may switch to heat after 24 hours. Epidural Steroid Injection An epidural steroid injection is a shot of steroid medicine and numbing medicine that is given into the space between the spinal cord and the bones in your back (epidural space). The shot helps relieve pain caused by an irritated or swollen nerve root. The amount of pain relief you get from the injection depends on what is causing the nerve to be swollen and irritated, and how long your pain lasts. You are more likely to benefit from this injection if your pain is strong and comes on suddenly rather than if you have  had pain for a long time. Tell a health care provider about:  Any allergies you have.  All medicines you are taking, including vitamins, herbs, eye drops, creams, and over-the-counter medicines.  Any problems you or family members have had with anesthetic medicines.  Any blood disorders you have.  Any surgeries you have had.  Any medical conditions you have.  Whether you are pregnant or may be pregnant. What are the risks? Generally, this is a safe procedure. However, problems may occur, including:  Headache.  Bleeding.  Infection.  Allergic reaction to medicines.  Damage to your nerves. What happens before the procedure? Staying hydrated  Follow instructions from your health care provider about hydration, which may include:  Up to 2 hours before the procedure - you may continue to drink clear liquids, such as water, clear fruit juice, black coffee, and plain tea. Eating and drinking restrictions  Follow instructions from your health care provider about eating and drinking, which may include:  8 hours before the procedure - stop eating heavy meals or foods such as meat, fried foods, or fatty foods.  6 hours before the procedure - stop eating light meals or foods, such as toast or cereal.  6 hours before the procedure - stop drinking milk or drinks that contain milk.  2 hours before the procedure - stop drinking clear liquids. Medicine  You may be given medicines to lower anxiety.  Ask your health care provider about:  Changing or stopping your regular medicines. This is especially important if you are taking diabetes medicines or blood thinners.  Taking medicines such as aspirin and ibuprofen. These medicines can thin your blood. Do not take these medicines before your procedure if your health care provider instructs you not to. General instructions  Plan to have someone take you home from the hospital or clinic. What happens during the procedure?  You may  receive a medicine to help you relax (sedative).  You will be asked to lie on your abdomen.  The injection site will be cleaned.  A numbing medicine (local anesthetic) will be used to numb the injection site.  A needle will be inserted through your skin into the epidural space. You may feel some discomfort when this happens. An X-ray machine will be used to make sure the needle is put as close as possible to the affected nerve.  A steroid medicine and a local anesthetic will be injected into the epidural space.  The needle will be removed.  A bandage (dressing) will be put over the injection site. What happens after the procedure?  Your blood pressure, heart rate, breathing rate, and blood oxygen level will be monitored until the medicines you were given have worn off.  Your arm or leg may feel weak or numb for a few hours.  The injection site may feel sore.  Do not drive for 24  hours if you received a sedative. This information is not intended to replace advice given to you by your health care provider. Make sure you discuss any questions you have with your health care provider. Document Released: 03/11/2008 Document Revised: 05/16/2016 Document Reviewed: 03/20/2016 Elsevier Interactive Patient Education  2017 [employer]  Epidural Steroid Injection An epidural steroid injection is a shot of steroid medicine and numbing medicine that is given into the space between the spinal cord and the bones in your back (epidural space). The shot helps relieve pain caused by an irritated or swollen nerve root. The amount of pain relief you get from the injection depends on what is causing the nerve to be swollen and irritated, and how long your pain lasts. You are more likely to benefit from this injection if your pain is strong and comes on suddenly rather than if you have had pain for a long time. Tell a health care provider about:  Any allergies you have.  All medicines you are taking,  including vitamins, herbs, eye drops, creams, and over-the-counter medicines.  Any problems you or family members have had with anesthetic medicines.  Any blood disorders you have.  Any surgeries you have had.  Any medical conditions you have.  Whether you are pregnant or may be pregnant. What are the risks? Generally, this is a safe procedure. However, problems may occur, including:  Headache.  Bleeding.  Infection.  Allergic reaction to medicines.  Damage to your nerves. What happens before the procedure? Staying hydrated  Follow instructions from your health care provider about hydration, which may include:  Up to 2 hours before the procedure - you may continue to drink clear liquids, such as water, clear fruit juice, black coffee, and plain tea. Eating and drinking restrictions  Follow instructions from your health care provider about eating and drinking, which may include:  8 hours before the procedure - stop eating heavy meals or foods such as meat, fried foods, or fatty foods.  6 hours before the procedure - stop eating light meals or foods, such as toast or cereal.  6 hours before the procedure - stop drinking milk or drinks that contain milk.  2 hours before the procedure - stop drinking clear liquids. Medicine  You may be given medicines to lower anxiety.  Ask your health care provider about:  Changing or stopping your regular medicines. This is especially important if you are taking diabetes medicines or blood thinners.  Taking medicines such as aspirin and ibuprofen. These medicines can thin your blood. Do not take these medicines before your procedure if your health care provider instructs you not to. General instructions  Plan to have someone take you home from the hospital or clinic. What happens during the procedure?  You may receive a medicine to help you relax (sedative).  You will be asked to lie on your abdomen.  The injection site will be  cleaned.  A numbing medicine (local anesthetic) will be used to numb the injection site.  A needle will be inserted through your skin into the epidural space. You may feel some discomfort when this happens. An X-ray machine will be used to make sure the needle is put as close as possible to the affected nerve.  A steroid medicine and a local anesthetic will be injected into the epidural space.  The needle will be removed.  A bandage (dressing) will be put over the injection site. What happens after the procedure?  Your blood pressure, heart  rate, breathing rate, and blood oxygen level will be monitored until the medicines you were given have worn off.  Your arm or leg may feel weak or numb for a few hours.  The injection site may feel sore.  Do not drive for 24 hours if you received a sedative. This information is not intended to replace advice given to you by your health care provider. Make sure you discuss any questions you have with your health care provider. Document Released: 03/11/2008 Document Revised: 05/16/2016 Document Reviewed: 03/20/2016 Elsevier Interactive Patient Education  2017 [employer].

## 2017-01-17 ENCOUNTER — Ambulatory Visit: Payer: Medicare HMO | Admitting: Family Medicine

## 2017-01-21 ENCOUNTER — Ambulatory Visit: Payer: Medicare HMO | Admitting: Pain Medicine

## 2017-01-22 ENCOUNTER — Telehealth: Payer: Self-pay | Admitting: Pain Medicine

## 2017-01-22 NOTE — Telephone Encounter (Signed)
Mr. Generette called and the insurance will not pay for one of the medications that Dr Dossie Arbour wrote for Mrs Rueger.   Please call

## 2017-01-23 LAB — TOXASSURE SELECT 13 (MW), URINE

## 2017-01-24 NOTE — Telephone Encounter (Signed)
Attempted to call Mr. Diersen, message left.

## 2017-01-29 NOTE — Telephone Encounter (Signed)
Spoke with Dominica Severin (son). Cyclobenzaprine is not covered by insurance. Lajean Silvius to find out what muscle relaxer is covered by her insurance and bring that info to her next appointment.

## 2017-02-04 ENCOUNTER — Ambulatory Visit
Admission: RE | Admit: 2017-02-04 | Discharge: 2017-02-04 | Disposition: A | Discharge status: Home / Self Care | Payer: Medicare HMO | Source: Ambulatory Visit | Attending: Pain Medicine | Admitting: Pain Medicine

## 2017-02-04 ENCOUNTER — Telehealth: Payer: Self-pay

## 2017-02-04 ENCOUNTER — Encounter: Payer: Self-pay | Admitting: Pain Medicine

## 2017-02-04 ENCOUNTER — Ambulatory Visit (HOSPITAL_BASED_OUTPATIENT_CLINIC_OR_DEPARTMENT_OTHER): Payer: Medicare HMO | Admitting: Pain Medicine

## 2017-02-04 VITALS — BP 130/76 | HR 75 | Temp 98.2°F | Resp 14 | Ht 73.0 in | Wt 213.0 lb

## 2017-02-04 DIAGNOSIS — M5416 Radiculopathy, lumbar region: Secondary | ICD-10-CM | POA: Diagnosis not present

## 2017-02-04 DIAGNOSIS — G8929 Other chronic pain: Secondary | ICD-10-CM

## 2017-02-04 DIAGNOSIS — G96198 Other disorders of meninges, not elsewhere classified: Secondary | ICD-10-CM

## 2017-02-04 DIAGNOSIS — M79605 Pain in left leg: Secondary | ICD-10-CM | POA: Diagnosis not present

## 2017-02-04 DIAGNOSIS — Z9049 Acquired absence of other specified parts of digestive tract: Secondary | ICD-10-CM | POA: Diagnosis not present

## 2017-02-04 DIAGNOSIS — M961 Postlaminectomy syndrome, not elsewhere classified: Secondary | ICD-10-CM | POA: Insufficient documentation

## 2017-02-04 DIAGNOSIS — M549 Dorsalgia, unspecified: Secondary | ICD-10-CM | POA: Insufficient documentation

## 2017-02-04 DIAGNOSIS — G9619 Other disorders of meninges, not elsewhere classified: Secondary | ICD-10-CM | POA: Insufficient documentation

## 2017-02-04 DIAGNOSIS — Z885 Allergy status to narcotic agent status: Secondary | ICD-10-CM | POA: Insufficient documentation

## 2017-02-04 MED ORDER — LACTATED RINGERS IV SOLN
1000.0000 mL | Freq: Once | INTRAVENOUS | Status: AC
  Administered 2017-02-04: 1000 mL via INTRAVENOUS

## 2017-02-04 MED ORDER — SODIUM CHLORIDE 0.9% FLUSH
2.0000 mL | Freq: Once | INTRAVENOUS | Status: AC
  Administered 2017-02-04: 2 mL

## 2017-02-04 MED ORDER — ROPIVACAINE HCL 2 MG/ML IJ SOLN
2.0000 mL | Freq: Once | INTRAMUSCULAR | Status: AC
  Administered 2017-02-04: 2 mL via EPIDURAL
  Filled 2017-02-04: qty 20

## 2017-02-04 MED ORDER — IOPAMIDOL (ISOVUE-M 200) INJECTION 41%
10.0000 mL | Freq: Once | INTRAMUSCULAR | Status: AC
  Administered 2017-02-04: 10 mL via EPIDURAL
  Filled 2017-02-04: qty 10

## 2017-02-04 MED ORDER — MIDAZOLAM HCL 5 MG/5ML IJ SOLN
1.0000 mg | INTRAMUSCULAR | Status: DC | PRN
  Administered 2017-02-04: 2 mg via INTRAVENOUS
  Administered 2017-02-04 (×2): 1 mg via INTRAVENOUS
  Filled 2017-02-04: qty 5

## 2017-02-04 MED ORDER — TRIAMCINOLONE ACETONIDE 40 MG/ML IJ SUSP
40.0000 mg | Freq: Once | INTRAMUSCULAR | Status: AC
  Administered 2017-02-04: 40 mg
  Filled 2017-02-04: qty 1

## 2017-02-04 MED ORDER — LIDOCAINE HCL (PF) 1 % IJ SOLN
10.0000 mL | Freq: Once | INTRAMUSCULAR | Status: AC
  Administered 2017-02-04: 10 mL
  Filled 2017-02-04: qty 10

## 2017-02-04 NOTE — Progress Notes (Addendum)
Patient's Name: Maria Hamilton  MRN: [id number]  Referring Provider: Delano Metz, MD  DOB: May 07, 1951  PCP: Glori Luis, MD  DOS: 02/04/2017  Note by: Sydnee Levans. Laban Emperor, MD  Service setting: Ambulatory outpatient  Location: [facility] (AMB) Pain Management Facility  Visit type: Procedure  Specialty: Interventional Pain Management  Patient type: Established   Primary Reason for Visit: Interventional Pain Management Treatment. CC: Back Pain (low )  Procedure:  Anesthesia, Analgesia, Anxiolysis:  Type: Diagnostic Epidural Steroid Injection + Diagnostic Epidurogram Region: Caudal Level: Sacrococcygeal   Laterality: Midline aiming at the left  Type: Local Anesthesia with Moderate (Conscious) Sedation Local Anesthetic: Lidocaine 1% Route: Intravenous (IV) IV Access: Secured Sedation: Meaningful verbal contact was maintained at all times during the procedure  Indication(s): Analgesia and Anxiety  Indications: 1. Chronic lumbar radicular pain (L5/S1 dermatome) (Location of Secondary source of pain) (Left)   2. Chronic lower extremity pain (Location of Secondary source of pain) (Left)   3. Failed back surgical syndrome (x 3)   4. Epidural fibrosis    Pain Score: Pre-procedure: 8 /10 Post-procedure: 5 /10  Pre-op Assessment:  Previous date of service: 01/15/17 Service provided: Med Refill Maria Hamilton is a 66 y.o. (year old), female patient, seen today for interventional treatment. She  has a past surgical history that includes Breast surgery; Back surgery; Ectopic pregnancy surgery; Hip surgery; Abdominal hysterectomy; cyst removal from wrist; and Cholecystectomy (N/A, 10/16/2016). Her primarily concern today is the Back Pain (low )  Initial Vital Signs: Blood pressure 125/83, pulse 83, temperature 98.5 F (36.9 C), resp. rate 18, height 6\' 1"  (1.854 m), weight 213 lb (96.6 kg), SpO2 98 %. BMI: 28.10 kg/m  Risk Assessment: Allergies: Reviewed. She is allergic to ace  inhibitors; ciprofloxacin; fentanyl; morphine and related; penicillin g benzathine; and tape.  Allergy Precautions: None required Coagulopathies: "Reviewed. None identified.  Blood-thinner therapy: None at this time Active Infection(s): Reviewed. None identified. Maria Hamilton is afebrile  Site Confirmation: Maria Hamilton was asked to confirm the procedure and laterality before marking the site Procedure checklist: Completed Consent: Before the procedure and under the influence of no sedative(s), amnesic(s), or anxiolytics, the patient was informed of the treatment options, risks and possible complications. To fulfill our ethical and legal obligations, as recommended by the American Medical Association's Code of Ethics, I have informed the patient of my clinical impression; the nature and purpose of the treatment or procedure; the risks, benefits, and possible complications of the intervention; the alternatives, including doing nothing; the risk(s) and benefit(s) of the alternative treatment(s) or procedure(s); and the risk(s) and benefit(s) of doing nothing. The patient was provided information about the general risks and possible complications associated with the procedure. These may include, but are not limited to: failure to achieve desired goals, infection, bleeding, organ or nerve damage, allergic reactions, paralysis, and death. In addition, the patient was informed of those risks and complications associated to Spine-related procedures, such as failure to decrease pain; infection (i.e.: Meningitis, epidural or intraspinal abscess); bleeding (i.e.: epidural hematoma, subarachnoid hemorrhage, or any other type of intraspinal or peri-dural bleeding); organ or nerve damage (i.e.: Any type of peripheral nerve, nerve root, or spinal cord injury) with subsequent damage to sensory, motor, and/or autonomic systems, resulting in permanent pain, numbness, and/or weakness of one or several areas of the body;  allergic reactions; (i.e.: anaphylactic reaction); and/or death. Furthermore, the patient was informed of those risks and complications associated with the medications. These include, but are not limited to:  allergic reactions (i.e.: anaphylactic or anaphylactoid reaction(s)); adrenal axis suppression; blood sugar elevation that in diabetics may result in ketoacidosis or comma; water retention that in patients with history of congestive heart failure may result in shortness of breath, pulmonary edema, and decompensation with resultant heart failure; weight gain; swelling or edema; medication-induced neural toxicity; particulate matter embolism and blood vessel occlusion with resultant organ, and/or nervous system infarction; and/or aseptic necrosis of one or more joints. Finally, the patient was informed that Medicine is not an exact science; therefore, there is also the possibility of unforeseen or unpredictable risks and/or possible complications that may result in a catastrophic outcome. The patient indicated having understood very clearly. We have given the patient no guarantees and we have made no promises. Enough time was given to the patient to ask questions, all of which were answered to the patient's satisfaction. Maria Hamilton has indicated that she wanted to continue with the procedure. Attestation: I, the ordering provider, attest that I have discussed with the patient the benefits, risks, side-effects, alternatives, likelihood of achieving goals, and potential problems during recovery for the procedure that I have provided informed consent. Date: 02/04/2017; Time: 10:28 AM  Pre-Procedure Preparation:  Monitoring: As per clinic protocol. Respiration, ETCO2, SpO2, BP, heart rate and rhythm monitor placed and checked for adequate function Safety Precautions: Patient was assessed for positional comfort and pressure points before starting the procedure. Time-out: I initiated and conducted the  "Time-out" before starting the procedure, as per protocol. The patient was asked to participate by confirming the accuracy of the "Time Out" information. Verification of the correct person, site, and procedure were performed and confirmed by me, the nursing staff, and the patient. "Time-out" conducted as per Joint Commission's Universal Protocol (UP.01.01.01). "Time-out" Date & Time: 02/04/2017; 1104 hrs.  Description of Procedure Process:   Position: Prone Target Area: Caudal Epidural Canal. Approach: Midline approach. Area Prepped: Entire Posterior Sacrococcygeal Region Prepping solution: ChloraPrep (2% chlorhexidine gluconate and 70% isopropyl alcohol) Safety Precautions: Aspiration looking for blood return was conducted prior to all injections. At no point did we inject any substances, as a needle was being advanced. No attempts were made at seeking any paresthesias. Safe injection practices and needle disposal techniques used. Medications properly checked for expiration dates. SDV (single dose vial) medications used. Description of the Procedure: Protocol guidelines were followed. The patient was placed in position over the fluoroscopy table. The target area was identified and the area prepped in the usual manner. Skin desensitized using vapocoolant spray. Skin & deeper tissues infiltrated with local anesthetic. Appropriate amount of time allowed to pass for local anesthetics to take effect. The procedure needles were then advanced to the target area. Proper needle placement secured. Negative aspiration confirmed. Solution injected in intermittent fashion, asking for systemic symptoms every 0.5cc of injectate. The needles were then removed and the area cleansed, making sure to leave some of the prepping solution back to take advantage of its long term bactericidal properties. Vitals:   02/04/17 1126 02/04/17 1135 02/04/17 1145 02/04/17 1155  BP: (!) 144/87 (!) 135/92 134/80 130/76  Pulse: 69 72 69  75  Resp: 16 11 14 14   Temp: 98.2 F (36.8 C)     SpO2: 98% 95% 97% 97%  Weight:      Height:        Start Time: 1104 hrs. End Time: 1116 hrs. Materials:  Needle(s) Type: Epidural needle Gauge: 17G Length: 3.5-in Medication(s): We administered lactated ringers, midazolam, iopamidol, triamcinolone acetonide, lidocaine (  PF), sodium chloride flush, and ropivacaine (PF) 2 mg/mL (0.2%). Please see chart orders for dosing details.  Imaging Guidance (Spinal):  Type of Imaging Technique: Fluoroscopy Guidance (Spinal) Indication(s): Assistance in needle guidance and placement for procedures requiring needle placement in or near specific anatomical locations not easily accessible without such assistance. Exposure Time: Please see nurses notes. Contrast: Before injecting any contrast, we confirmed that the patient did not have an allergy to iodine, shellfish, or radiological contrast. Once satisfactory needle placement was completed at the desired level, radiological contrast was injected. Contrast injected under live fluoroscopy. No contrast complications. See chart for type and volume of contrast used. Fluoroscopic Guidance: I was personally present during the use of fluoroscopy. "Tunnel Vision Technique" used to obtain the best possible view of the target area. Parallax error corrected before commencing the procedure. "Direction-depth-direction" technique used to introduce the needle under continuous pulsed fluoroscopy. Once target was reached, antero-posterior, oblique, and lateral fluoroscopic projection used confirm needle placement in all planes. Images permanently stored in EMR. Interpretation: I personally interpreted the imaging intraoperatively. Adequate needle placement confirmed in multiple planes. Appropriate spread of contrast into desired area was observed. No evidence of afferent or efferent intravascular uptake. No intrathecal or subarachnoid spread observed. Permanent images saved into  the patient's record.  Diagnostic Epidurogram:  Contrast: Before injecting any contrast, we confirmed that the patient did not have an allergy to iodine, shellfish, or radiological contrast. For accuracy purposes, contrast was injected under live fluoroscopy. Study personally interpreted intraoparatively. Type: Non-ionic, water soluble, hypoallergenic, myelogram-compatible, radiological contrast used. Please see orders and nurses note for specific choice of contrast. Volume: Please see nurses note for injected volume.  Observations:  Spinal Alignment: Adequate       Vertebral body: Intact Lamina: Surgical changes observed Disc: Loss of disc hight at L5-S1 Facet: Arthropathy  Moderate Hardware: Pedicle screw(s), bilateral between L5 and S1  Spread: Appropriate epidural spread of contrast Anterior: No flow of contrast Posterior: Defect identified starting at the upper endplate of S1 with absolutely no flow from midbody of L5 towards the cephalad direction. Epidural scar defect identified primarily affecting the left side of the epidural space with no contrast observed over the left L5 and limited S1 nerve root feeling."V" shaped defect in the midline. Superior (cephalad): Defect identified stopping flow at the mid body of L5 more cephalad spread on the right side when compared to the left. Inferior (caudad): Adequate flow observed in 2 day S1, S2, and S3 nerve roots, bilaterally. Right lateral: Contrast observed to have low preferentially towards the right side when compared to the left. Left lateral: Defect identified flow defect observed to be around the lower screw and no flow into with no visceral initiation of the left L5 nerve root Plica medialis dorsalis: Medially aligned, but observed to stop at the upper end plate of S1. Nerve root sleeve: Defect identified affecting primarily the left L5 nerve root with decreased flow into the right L5 and S1 nerve roots. Epidural extravasation: None  observed Intrathecal: No intrathecal spread identified Subarachnoid: No subarachnoid spread pattern observed Vascular: No evidence of afferent or efferent intravascular uptake  Impression: Technically successful epidurogram. Observed changes are compatible with epidural fibrosis, as described above Note: Hard copies saved to EMR.  Antibiotic Prophylaxis:  Indication(s): None identified Antibiotic given: None  Post-operative Assessment:  EBL: None Complications: No immediate post-treatment complications observed by team, or reported by patient. Note: The patient tolerated the entire procedure well. A repeat set of vitals  were taken after the procedure and the patient was kept under observation following institutional policy, for this type of procedure. Post-procedural neurological assessment was performed, showing return to baseline, prior to discharge. The patient was provided with post-procedure discharge instructions, including a section on how to identify potential problems. Should any problems arise concerning this procedure, the patient was given instructions to immediately contact us, at any time, without hesitation. In any case, we plan to contact the patient by telephone for a follow-up status report regarding this interventional procedure. Comments:  No additional relevant information.  Plan of Care  Disposition: Discharge home  Discharge Date & Time: 02/04/2017; 1159 hrs.  Physician-requested Follow-up:  Return in about 2 weeks (around 02/18/2017) for Post-Procedure evaluation.  Future Appointments Date Time Provider Department Center  02/13/2017 1:15 PM Delano Metz, MD [facility] None   Medications ordered for procedure: Meds ordered this encounter  Medications  . lactated ringers infusion 1,000 mL  . midazolam (VERSED) 5 MG/5ML injection 1-2 mg    Make sure Flumazenil is available in the pyxis when using this medication. If oversedation occurs, administer 0.2 mg IV over  15 sec. If after 45 sec no response, administer 0.2 mg again over 1 min; may repeat at 1 min intervals; not to exceed 4 doses (1 mg)  . iopamidol (ISOVUE-M) 41 % intrathecal injection 10 mL  . triamcinolone acetonide (KENALOG-40) injection 40 mg  . lidocaine (PF) (XYLOCAINE) 1 % injection 10 mL  . sodium chloride flush (NS) 0.9 % injection 2 mL  . ropivacaine (PF) 2 mg/mL (0.2%) (NAROPIN) injection 2 mL   Medications administered: We administered lactated ringers, midazolam, iopamidol, triamcinolone acetonide, lidocaine (PF), sodium chloride flush, and ropivacaine (PF) 2 mg/mL (0.2%).  See the medical record for exact dosing, route, and time of administration.  Lab-work, Procedure(s), & Referral(s) Ordered: Orders Placed This Encounter  Procedures  . DG C-Arm 1-60 Min-No Report  . Discharge instructions  . Follow-up  . Informed Consent Details: Transcribe to consent form and obtain patient signature  . Provider attestation of informed consent for procedure/surgical case  . Verify informed consent   Imaging Ordered: Results for orders placed in visit on 01/09/17  DG C-Arm 1-60 Min-No Report   Narrative There is no Radiologist interpretation  for this exam.   New Prescriptions   No medications on file   Primary Care Physician: Glori Luis, MD Location: [facility] Note by: Sydnee Levans. Laban Emperor, M.D, DABA, DABAPM, DABPM, DABIPP, FIPP Date: 02/04/2017; Time: 12:13 PM  Disclaimer:  Medicine is not an Visual merchandiser. The only guarantee in medicine is that nothing is guaranteed. It is important to note that the decision to proceed with this intervention was based on the information collected from the patient. The Data and conclusions were drawn from the patient's questionnaire, the interview, and the physical examination. Because the information was provided in large part by the patient, it cannot be guaranteed that it has not been purposely or  unconsciously manipulated. Every effort has been made to obtain as much relevant data as possible for this evaluation. It is important to note that the conclusions that lead to this procedure are derived in large part from the available data. Always take into account that the treatment will also be dependent on availability of resources and existing treatment guidelines, considered by other Pain Management Practitioners as being common knowledge and practice, at the time of the intervention. For Medico-Legal purposes, it is also important to point  out that variation in procedural techniques and pharmacological choices are the acceptable norm. The indications, contraindications, technique, and results of the above procedure should only be interpreted and judged by a Board-Certified Interventional Pain Specialist with extensive familiarity and expertise in the same exact procedure and technique. Attempts at providing opinions without similar or greater experience and expertise than that of the treating physician will be considered as inappropriate and unethical, and shall result in a formal complaint to the state medical board and applicable specialty societies.  Instructions provided at this appointment: Patient Instructions  Pain Management Discharge Instructions  General Discharge Instructions :  If you need to reach your doctor call: Monday-Friday 8:00 am - 4:00 pm at [phone] or toll free [phone].  After clinic hours [phone] to have operator reach doctor.  Bring all of your medication bottles to all your appointments in the pain clinic.  To cancel or reschedule your appointment with Pain Management please remember to call 24 hours in advance to avoid a fee.  Refer to the educational materials which you have been given on: General Risks, I had my Procedure. Discharge Instructions, Post Sedation.  Post Procedure Instructions:  The drugs you were given will stay in your system until  tomorrow, so for the next 24 hours you should not drive, make any legal decisions or drink any alcoholic beverages.  You may eat anything you prefer, but it is better to start with liquids then soups and crackers, and gradually work up to solid foods.  Please notify your doctor immediately if you have any unusual bleeding, trouble breathing or pain that is not related to your normal pain.  Depending on the type of procedure that was done, some parts of your body may feel week and/or numb.  This usually clears up by tonight or the next day.  Walk with the use of an assistive device or accompanied by an adult for the 24 hours.  You may use ice on the affected area for the first 24 hours.  Put ice in a Ziploc bag and cover with a towel and place against area 15 minutes on 15 minutes off.  You may switch to heat after 24 hours.Epidural Steroid Injection An epidural steroid injection is a shot of steroid medicine and numbing medicine that is given into the space between the spinal cord and the bones in your back (epidural space). The shot helps relieve pain caused by an irritated or swollen nerve root. The amount of pain relief you get from the injection depends on what is causing the nerve to be swollen and irritated, and how long your pain lasts. You are more likely to benefit from this injection if your pain is strong and comes on suddenly rather than if you have had pain for a long time. Tell a health care provider about:  Any allergies you have.  All medicines you are taking, including vitamins, herbs, eye drops, creams, and over-the-counter medicines.  Any problems you or family members have had with anesthetic medicines.  Any blood disorders you have.  Any surgeries you have had.  Any medical conditions you have.  Whether you are pregnant or may be pregnant. What are the risks? Generally, this is a safe procedure. However, problems may occur,  including:  Headache.  Bleeding.  Infection.  Allergic reaction to medicines.  Damage to your nerves. What happens before the procedure? Staying hydrated  Follow instructions from your health care provider about hydration, which may include:  Up to 2  hours before the procedure - you may continue to drink clear liquids, such as water, clear fruit juice, black coffee, and plain tea. Eating and drinking restrictions  Follow instructions from your health care provider about eating and drinking, which may include:  8 hours before the procedure - stop eating heavy meals or foods such as meat, fried foods, or fatty foods.  6 hours before the procedure - stop eating light meals or foods, such as toast or cereal.  6 hours before the procedure - stop drinking milk or drinks that contain milk.  2 hours before the procedure - stop drinking clear liquids. Medicine  You may be given medicines to lower anxiety.  Ask your health care provider about:  Changing or stopping your regular medicines. This is especially important if you are taking diabetes medicines or blood thinners.  Taking medicines such as aspirin and ibuprofen. These medicines can thin your blood. Do not take these medicines before your procedure if your health care provider instructs you not to. General instructions  Plan to have someone take you home from the hospital or clinic. What happens during the procedure?  You may receive a medicine to help you relax (sedative).  You will be asked to lie on your abdomen.  The injection site will be cleaned.  A numbing medicine (local anesthetic) will be used to numb the injection site.  A needle will be inserted through your skin into the epidural space. You may feel some discomfort when this happens. An X-ray machine will be used to make sure the needle is put as close as possible to the affected nerve.  A steroid medicine and a local anesthetic will be injected into the  epidural space.  The needle will be removed.  A bandage (dressing) will be put over the injection site. What happens after the procedure?  Your blood pressure, heart rate, breathing rate, and blood oxygen level will be monitored until the medicines you were given have worn off.  Your arm or leg may feel weak or numb for a few hours.  The injection site may feel sore.  Do not drive for 24 hours if you received a sedative. This information is not intended to replace advice given to you by your health care provider. Make sure you discuss any questions you have with your health care provider. Document Released: 03/11/2008 Document Revised: 05/16/2016 Document Reviewed: 03/20/2016 Elsevier Interactive Patient Education  2017 [employer].

## 2017-02-04 NOTE — Patient Instructions (Signed)
Pain Management Discharge Instructions  General Discharge Instructions :  If you need to reach your doctor call: Monday-Friday 8:00 am - 4:00 pm at [phone] or toll free [phone].  After clinic hours [phone] to have operator reach doctor.  Bring all of your medication bottles to all your appointments in the pain clinic.  To cancel or reschedule your appointment with Pain Management please remember to call 24 hours in advance to avoid a fee.  Refer to the educational materials which you have been given on: General Risks, I had my Procedure. Discharge Instructions, Post Sedation.  Post Procedure Instructions:  The drugs you were given will stay in your system until tomorrow, so for the next 24 hours you should not drive, make any legal decisions or drink any alcoholic beverages.  You may eat anything you prefer, but it is better to start with liquids then soups and crackers, and gradually work up to solid foods.  Please notify your doctor immediately if you have any unusual bleeding, trouble breathing or pain that is not related to your normal pain.  Depending on the type of procedure that was done, some parts of your body may feel week and/or numb.  This usually clears up by tonight or the next day.  Walk with the use of an assistive device or accompanied by an adult for the 24 hours.  You may use ice on the affected area for the first 24 hours.  Put ice in a Ziploc bag and cover with a towel and place against area 15 minutes on 15 minutes off.  You may switch to heat after 24 hours.Epidural Steroid Injection An epidural steroid injection is a shot of steroid medicine and numbing medicine that is given into the space between the spinal cord and the bones in your back (epidural space). The shot helps relieve pain caused by an irritated or swollen nerve root. The amount of pain relief you get from the injection depends on what is causing the nerve to be swollen and irritated,  and how long your pain lasts. You are more likely to benefit from this injection if your pain is strong and comes on suddenly rather than if you have had pain for a long time. Tell a health care provider about:  Any allergies you have.  All medicines you are taking, including vitamins, herbs, eye drops, creams, and over-the-counter medicines.  Any problems you or family members have had with anesthetic medicines.  Any blood disorders you have.  Any surgeries you have had.  Any medical conditions you have.  Whether you are pregnant or may be pregnant. What are the risks? Generally, this is a safe procedure. However, problems may occur, including:  Headache.  Bleeding.  Infection.  Allergic reaction to medicines.  Damage to your nerves. What happens before the procedure? Staying hydrated  Follow instructions from your health care provider about hydration, which may include:  Up to 2 hours before the procedure - you may continue to drink clear liquids, such as water, clear fruit juice, black coffee, and plain tea. Eating and drinking restrictions  Follow instructions from your health care provider about eating and drinking, which may include:  8 hours before the procedure - stop eating heavy meals or foods such as meat, fried foods, or fatty foods.  6 hours before the procedure - stop eating light meals or foods, such as toast or cereal.  6 hours before the procedure - stop drinking milk or drinks that contain milk.    2 hours before the procedure - stop drinking clear liquids. Medicine  You may be given medicines to lower anxiety.  Ask your health care provider about:  Changing or stopping your regular medicines. This is especially important if you are taking diabetes medicines or blood thinners.  Taking medicines such as aspirin and ibuprofen. These medicines can thin your blood. Do not take these medicines before your procedure if your health care provider instructs  you not to. General instructions  Plan to have someone take you home from the hospital or clinic. What happens during the procedure?  You may receive a medicine to help you relax (sedative).  You will be asked to lie on your abdomen.  The injection site will be cleaned.  A numbing medicine (local anesthetic) will be used to numb the injection site.  A needle will be inserted through your skin into the epidural space. You may feel some discomfort when this happens. An X-ray machine will be used to make sure the needle is put as close as possible to the affected nerve.  A steroid medicine and a local anesthetic will be injected into the epidural space.  The needle will be removed.  A bandage (dressing) will be put over the injection site. What happens after the procedure?  Your blood pressure, heart rate, breathing rate, and blood oxygen level will be monitored until the medicines you were given have worn off.  Your arm or leg may feel weak or numb for a few hours.  The injection site may feel sore.  Do not drive for 24 hours if you received a sedative. This information is not intended to replace advice given to you by your health care provider. Make sure you discuss any questions you have with your health care provider. Document Released: 03/11/2008 Document Revised: 05/16/2016 Document Reviewed: 03/20/2016 Elsevier Interactive Patient Education  2017 [employer]  

## 2017-02-04 NOTE — Progress Notes (Signed)
Safety precautions to be maintained throughout the outpatient stay will include: orient to surroundings, keep bed in low position, maintain call bell within reach at all times, provide assistance with transfer out of bed and ambulation.  

## 2017-02-04 NOTE — Telephone Encounter (Signed)
Received a call from patient stating she was having pain.  States it was the same pain she was having pre procedure.  States she did not have any pain when she left the clinic today.  Explained to patient that the numbing medicine has worn off and the pain has started again.  Explained that this is normal.  Explained that it takes 4-10 days for steroid to start working.  Patient denies fever of uncontrolled bowel or bladder.  Encouraged patient to put ice today and to document her pain scale hourly today.  Informed patient that we would be calling her tomorrow to check on her.

## 2017-02-05 ENCOUNTER — Telehealth: Payer: Self-pay | Admitting: Pain Medicine

## 2017-02-05 ENCOUNTER — Telehealth: Payer: Self-pay

## 2017-02-05 NOTE — Telephone Encounter (Signed)
Post procedure phone call. Patient states her leg is still hurting.  She is putting heat on it today.  Encouraged her to take her gabapentin as prescribed.  Informed her that it would take 4-10 days for steroid to work.

## 2017-02-05 NOTE — Telephone Encounter (Signed)
Patient called 2 times stating she is having severe leg pain since meds from procedure have worn off and wants to speak with someone about this.

## 2017-02-05 NOTE — Telephone Encounter (Signed)
Please see post op call from 02/05/2017- documentation is there.

## 2017-02-07 ENCOUNTER — Other Ambulatory Visit
Admission: RE | Admit: 2017-02-07 | Discharge: 2017-02-07 | Disposition: A | Discharge status: Home / Self Care | Payer: Medicare HMO | Source: Ambulatory Visit | Attending: Pain Medicine | Admitting: Pain Medicine

## 2017-02-07 ENCOUNTER — Ambulatory Visit (HOSPITAL_BASED_OUTPATIENT_CLINIC_OR_DEPARTMENT_OTHER): Payer: Medicare HMO | Admitting: Pain Medicine

## 2017-02-07 ENCOUNTER — Telehealth: Payer: Self-pay

## 2017-02-07 ENCOUNTER — Other Ambulatory Visit: Payer: Self-pay

## 2017-02-07 ENCOUNTER — Encounter: Payer: Self-pay | Admitting: Pain Medicine

## 2017-02-07 VITALS — BP 154/91 | HR 85 | Temp 98.1°F | Resp 16 | Ht 73.0 in | Wt 213.0 lb

## 2017-02-07 DIAGNOSIS — M7918 Myalgia, other site: Secondary | ICD-10-CM

## 2017-02-07 DIAGNOSIS — M5416 Radiculopathy, lumbar region: Secondary | ICD-10-CM

## 2017-02-07 DIAGNOSIS — G8929 Other chronic pain: Secondary | ICD-10-CM | POA: Diagnosis not present

## 2017-02-07 DIAGNOSIS — M961 Postlaminectomy syndrome, not elsewhere classified: Secondary | ICD-10-CM | POA: Diagnosis not present

## 2017-02-07 DIAGNOSIS — M791 Myalgia: Secondary | ICD-10-CM | POA: Diagnosis not present

## 2017-02-07 DIAGNOSIS — G894 Chronic pain syndrome: Secondary | ICD-10-CM | POA: Diagnosis not present

## 2017-02-07 DIAGNOSIS — M79605 Pain in left leg: Secondary | ICD-10-CM

## 2017-02-07 DIAGNOSIS — M5440 Lumbago with sciatica, unspecified side: Secondary | ICD-10-CM | POA: Diagnosis present

## 2017-02-07 DIAGNOSIS — M792 Neuralgia and neuritis, unspecified: Secondary | ICD-10-CM | POA: Diagnosis not present

## 2017-02-07 DIAGNOSIS — M5442 Lumbago with sciatica, left side: Secondary | ICD-10-CM | POA: Diagnosis not present

## 2017-02-07 LAB — CBC WITH DIFFERENTIAL/PLATELET
BASOS ABS: 0 10*3/uL (ref 0–0.1)
BASOS PCT: 1 %
EOS PCT: 1 %
Eosinophils Absolute: 0 10*3/uL (ref 0–0.7)
HCT: 35.1 % (ref 35.0–47.0)
Hemoglobin: 11.9 g/dL — ABNORMAL LOW (ref 12.0–16.0)
Lymphocytes Relative: 27 %
Lymphs Abs: 1.7 10*3/uL (ref 1.0–3.6)
MCH: 27 pg (ref 26.0–34.0)
MCHC: 34 g/dL (ref 32.0–36.0)
MCV: 79.4 fL — ABNORMAL LOW (ref 80.0–100.0)
MONO ABS: 0.5 10*3/uL (ref 0.2–0.9)
Monocytes Relative: 7 %
NEUTROS ABS: 4 10*3/uL (ref 1.4–6.5)
Neutrophils Relative %: 64 %
PLATELETS: 346 10*3/uL (ref 150–440)
RBC: 4.41 MIL/uL (ref 3.80–5.20)
RDW: 16.2 % — AB (ref 11.5–14.5)
WBC: 6.3 10*3/uL (ref 3.6–11.0)

## 2017-02-07 MED ORDER — ORPHENADRINE CITRATE 30 MG/ML IJ SOLN
INTRAMUSCULAR | Status: AC
  Filled 2017-02-07: qty 2

## 2017-02-07 MED ORDER — TRAMADOL HCL 50 MG PO TABS: 100.0000 mg | ORAL_TABLET | Freq: Four times a day (QID) | ORAL | 0 refills | Status: DC | PRN

## 2017-02-07 MED ORDER — TRAMADOL HCL 50 MG PO TABS
100.0000 mg | ORAL_TABLET | Freq: Once | ORAL | Status: AC
Start: 2017-02-07 — End: 2017-02-07
  Administered 2017-02-07: 100 mg via ORAL

## 2017-02-07 MED ORDER — ORPHENADRINE CITRATE 30 MG/ML IJ SOLN
60.0000 mg | Freq: Once | INTRAMUSCULAR | Status: AC
  Administered 2017-02-07: 60 mg via INTRAMUSCULAR

## 2017-02-07 MED ORDER — TRAMADOL HCL 50 MG PO TABS: 100.0000 mg | ORAL_TABLET | Freq: Four times a day (QID) | ORAL | 2 refills | Status: DC | PRN

## 2017-02-07 MED ORDER — PREDNISONE 20 MG PO TABS: ORAL_TABLET | ORAL | 0 refills | Status: AC

## 2017-02-07 MED ORDER — KETOROLAC TROMETHAMINE 60 MG/2ML IM SOLN
60.0000 mg | Freq: Once | INTRAMUSCULAR | Status: AC
  Administered 2017-02-07: 60 mg via INTRAMUSCULAR

## 2017-02-07 MED ORDER — TIZANIDINE HCL 2 MG PO CAPS: 2.0000 mg | ORAL_CAPSULE | Freq: Three times a day (TID) | ORAL | 0 refills | Status: DC | PRN

## 2017-02-07 MED ORDER — KETOROLAC TROMETHAMINE 60 MG/2ML IM SOLN
INTRAMUSCULAR | Status: AC
  Filled 2017-02-07: qty 2

## 2017-02-07 MED ORDER — GABAPENTIN 300 MG PO CAPS: 300.0000 mg | ORAL_CAPSULE | Freq: Three times a day (TID) | ORAL | 0 refills | Status: DC

## 2017-02-07 NOTE — Patient Instructions (Addendum)
Pain Score  Introduction: The pain score used by this practice is the Verbal Numerical Rating Scale (VNRS-11). This is an 11-point scale. It is for adults and children 10 years or older. There are significant differences in how the pain score is reported, used, and applied. Forget everything you learned in the past and learn this scoring system.  General Information: The scale should reflect your current level of pain. Unless you are specifically asked for the level of your worst pain, or your average pain. If you are asked for one of these two, then it should be understood that it is over the past 24 hours.  Basic Activities of Daily Living (ADL): Personal hygiene, dressing, eating, transferring, and using restroom.  Instructions: Most patients tend to report their level of pain as a combination of two factors, their physical pain and their psychosocial pain. This last one is also known as "suffering" and it is reflection of how physical pain affects you socially and psychologically. From now on, report them separately. From this point on, when asked to report your pain level, report only your physical pain. Use the following table for reference.  Pain Clinic Pain Levels (0-5/10)  Pain Level Score Description  No Pain 0   Mild pain 1 Nagging, annoying, but does not interfere with basic activities of daily living (ADL). Patients are able to eat, bathe, get dressed, toileting (being able to get on and off the toilet and perform personal hygiene functions), transfer (move in and out of bed or a chair without assistance), and maintain continence (able to control bladder and bowel functions). Blood pressure and heart rate are unaffected. A normal heart rate for a healthy adult ranges from 60 to 100 bpm (beats per minute).   Mild to moderate pain 2 Noticeable and distracting. Impossible to hide from other people. More frequent flare-ups. Still possible to adapt and function close to normal. It can be very  annoying and may have occasional stronger flare-ups. With discipline, patients may get used to it and adapt.   Moderate pain 3 Interferes significantly with activities of daily living (ADL). It becomes difficult to feed, bathe, get dressed, get on and off the toilet or to perform personal hygiene functions. Difficult to get in and out of bed or a chair without assistance. Very distracting. With effort, it can be ignored when deeply involved in activities.   Moderately severe pain 4 Impossible to ignore for more than a few minutes. With effort, patients may still be able to manage work or participate in some social activities. Very difficult to concentrate. Signs of autonomic nervous system discharge are evident: dilated pupils (mydriasis); mild sweating (diaphoresis); sleep interference. Heart rate becomes elevated (>115 bpm). Diastolic blood pressure (lower number) rises above 100 mmHg. Patients find relief in laying down and not moving.   Severe pain 5 Intense and extremely unpleasant. Associated with frowning face and frequent crying. Pain overwhelms the senses.  Ability to do any activity or maintain social relationships becomes significantly limited. Conversation becomes difficult. Pacing back and forth is common, as getting into a comfortable position is nearly impossible. Pain wakes you up from deep sleep. Physical signs will be obvious: pupillary dilation; increased sweating; goosebumps; brisk reflexes; cold, clammy hands and feet; nausea, vomiting or dry heaves; loss of appetite; significant sleep disturbance with inability to fall asleep or to remain asleep. When persistent, significant weight loss is observed due to the complete loss of appetite and sleep deprivation.  Blood pressure and heart   rate becomes significantly elevated. Caution: If elevated blood pressure triggers a pounding headache associated with blurred vision, then the patient should immediately seek attention at an urgent or  emergency care unit, as these may be signs of an impending stroke.    Emergency Department Pain Levels (6-10/10)  Emergency Room Pain 6 Severely limiting. Requires emergency care and should not be seen or managed at an outpatient pain management facility. Communication becomes difficult and requires great effort. Assistance to reach the emergency department may be required. Facial flushing and profuse sweating along with potentially dangerous increases in heart rate and blood pressure will be evident.   Distressing pain 7 Self-care is very difficult. Assistance is required to transport, or use restroom. Assistance to reach the emergency department will be required. Tasks requiring coordination, such as bathing and getting dressed become very difficult.   Disabling pain 8 Self-care is no longer possible. At this level, pain is disabling. The individual is unable to do even the most "basic" activities such as walking, eating, bathing, dressing, transferring to a bed, or toileting. Fine motor skills are lost. It is difficult to think clearly.   Incapacitating pain 9 Pain becomes incapacitating. Thought processing is no longer possible. Difficult to remember your own name. Control of movement and coordination are lost.   The worst pain imaginable 10 At this level, most patients pass out from pain. When this level is reached, collapse of the autonomic nervous system occurs, leading to a sudden drop in blood pressure and heart rate. This in turn results in a temporary and dramatic drop in blood flow to the brain, leading to a loss of consciousness. Fainting is one of the body's self defense mechanisms. Passing out puts the brain in a calmed state and causes it to shut down for a while, in order to begin the healing process.    Summary: 1. Refer to this scale when providing [place] with your pain level. 2. Be accurate and careful when reporting your pain level. This will help with your care. 3. Over-reporting  your pain level will lead to loss of credibility. 4. Even a level of 1/10 means that there is pain and will be treated at our facility. 5. High, inaccurate reporting will be documented as "Symptom Exaggeration", leading to loss of credibility and suspicions of possible secondary gains such as obtaining more narcotics, or wanting to appear disabled, for fraudulent reasons. 6. Only pain levels of 5 or below will be seen at our facility. 7. Pain levels of 6 and above will be sent to the Emergency Department and the appointment cancelled. _____________________________________________________________________________________________  Gabapentin Titration  Medication used: Gabapentin (Generic Name) or Neurontin (Brand Name) 300 mg tablets/capsules  Reasons to stop increasing the dose:  Reason 1: You get good relief of symptoms, in which case there is no need to increase the daily dose any further.    Reason 2: You develop some side effects, such as sleeping all of the time, difficulty concentrating, or becoming disoriented, in which case you need to go down on the dose, to the prior level, where you were not experiencing any side effects. Stay on that dose longer, to allow more time for your body to get use it, before attempting to increase it again.   Reasons to stop increasing the dose: Reason 1: You get good relief of symptoms, in which case there is no need to increase the daily dose any further.  Reason 2: You develop some side effects, such as sleeping  all of the time, difficulty concentrating, or becoming disoriented, in which case you need to go down on the dose, to the prior level, where you were not experiencing any side effects. Stay on that dose longer, to allow more time for your body to get use it, before attempting to increase it again.  Steps to increase medication: Step 1: Start by taking 1 (one) tablet at bedtime x 7 (seven) days.  Step 2: After 7 (seven) days of taking 1 (one)  tablet at bedtime, increase it to 2 (two) tablets at bedtime. Stay on this dose x 7 (seven) days.  Step 3: After 7 (seven) days of taking 2 (two) tablets at bedtime, increase it to 3 (three) tablets at bedtime. Stay on this dose x another 7 (seven) days.  Step 4: After 7 (seven) days of taking 3 (three) tablet at bedtime, begin taking 1 (one) tablet at noon with lunch. Stay on this dose x another 7 (seven) days.  Step 5: After 7 (seven) days of taking 3 (three) tablet at bedtime, and 1 (one) tablet at noon, then begin taking 1 (one) tablet in the afternoon with dinner. Stay on this dose x another 7 (seven) days.  Step 6: After 7 (seven) days of taking 3 (three) tablet at bedtime, 1 (one) tablet at noon, and 1 (one) tablet in the afternoon, then begin taking 1 (one) tablet in the morning with breakfast. Stay on this dose x another 7 (seven) days. At this point you should be taking the medicine 4 (four) times a day, or about every 6 (six) hours. This daily regimen of taking the medicine 4 (four) times a day, will be maintained from now on. You should not take any doses any sooner than every 6 (six) hours.  Step 7: After 7 (seven) days of taking 3 (three) tablet at bedtime, 1 (one) tablet at noon, 1 (one) tablet in the afternoon, and 1 (one) tablet in the morning, begin taking 2 (two) tablets at noon with lunch. Stay on this dose x another 7 (seven) days.   Step 8: After 7 (seven) days of taking 3 (three) tablet at bedtime, 2 (two) tablets at noon, 1 (one) tablet in the afternoon, and 1 (one) tablet in the morning, begin taking 2 (two) tablets in the afternoon with dinner. Stay on this dose x another 7 (seven) days.   Step 9: After 7 (seven) days of taking 3 (three) tablet at bedtime, 2 (two) tablets at noon, 2 (two) tablets in the afternoon, and 1 (one) tablet in the morning, begin taking 2 (two) tablets in the morning with breakfast. Stay on this dose x another 7 (seven) days. At this point you should  be taking the medicine 4 (four) times a day, or about every 6 (six) hours. This daily regimen of taking the medicine 4 (four) times a day, will be maintained from now on. You should not take any doses any sooner than every 6 (six) hours.  Step 10: After 7 (seven) days of taking 3 (three) tablet at bedtime, 2 (two) tablets at noon, 2 (two) tablets in the afternoon, and 2 (two) tablets in the morning, begin taking 3 (three) tablets at noon with lunch. Stay on this dose x another 7 (seven) days.   Step 11: After 7 (seven) days of taking 3 (three) tablet at bedtime, 3 (three) tablets at noon, 2 (two) tablets in the afternoon, and 2 (two) tablets in the morning, begin taking 3 (three) tablets in the  afternoon with dinner. Stay on this dose x another 7 (seven) days.   Step 12: After 7 (seven) days of taking 3 (three) tablet at bedtime, 3 (three) tablets at noon, 3 (three) tablets in the afternoon, and 2 (two) tablet in the morning, begin taking 3 (three) tablets in the morning with breakfast. Stay on this dose x another 7 (seven) days. At this point you should be taking the medicine 4 (four) times a day, or about every 6 (six) hours. This daily regimen of taking the medicine 4 (four) times a day, will be maintained from now on.   Endpoint: Once you have reached the maximum dose you can tolerate without side-effects, contact your physician so as to evaluate the results of the regimen.   Questions: Feel free to contact us for any questions or problems at [phone]

## 2017-02-07 NOTE — Telephone Encounter (Signed)
Attempted to call patient and to bring her in for an appointment.  Left message for patient to call.

## 2017-02-07 NOTE — Progress Notes (Signed)
Safety precautions to be maintained throughout the outpatient stay will include: orient to surroundings, keep bed in low position, maintain call bell within reach at all times, provide assistance with transfer out of bed and ambulation.  

## 2017-02-07 NOTE — Telephone Encounter (Signed)
Patients husband called and said the patient has been up all night crying in pain. She had a procedure Monday. He wants to know if she can be seen today.

## 2017-02-07 NOTE — Progress Notes (Signed)
Patient's Name: Maria Hamilton  MRN: [id number]  Referring Provider: Glori Luis, MD  DOB: 02/28/1951  PCP: Glori Luis, MD  DOS: 02/07/2017  Note by: Sydnee Levans. Laban Emperor, MD  Service setting: Ambulatory outpatient  Specialty: Interventional Pain Management  Location: [facility] (AMB) Pain Management Facility    Patient type: Established   Primary Reason(s) for Visit: Encounter for prescription drug management & post-procedure evaluation of chronic illness with mild to moderate exacerbation(Level of risk: moderate) CC: Foot Pain (left)  HPI  Maria Hamilton is a 66 y.o. year old, female patient, who comes today for a post-procedure evaluation and medication management. She has GERD (gastroesophageal reflux disease); Rheumatoid factor positive; Fatigue; Vitamin D deficiency; Insomnia; Fibromyalgia; Chronic low back pain (Location of Primary Source of Pain) (Left); Chronic pain syndrome; Depression; Hypertension; Cellulitis due to MRSA; 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); Long term current use of opiate analgesic; Long term prescription opiate use; Opiate use; Encounter for therapeutic drug level monitoring; Encounter for pain management planning; Rheumatoid arthritis, multiple sites (positive RF); Chronic lower extremity pain (Location of Secondary source of pain) (Left); Lumbar facet arthropathy (Bilateral); Failed back surgical syndrome (x 3); Epidural fibrosis; Neurogenic pain; Musculoskeletal pain; Chronic lumbar radicular pain (L5/S1 dermatome) (Location of Secondary source of pain) (Left); History of total hip replacement, bilateral; Hypokalemia; Cholecystitis; Constipation; Therapeutic opioid-induced constipation (OIC); Palpitations; Epigastric pain; and Acute low back pain on her problem list. Her primarily concern today is the Foot Pain (left)  Pain Assessment: Self-Reported Pain Score: 6 /10             Reported level is compatible with observation.       Pain  Type: Post Delivery (post partum) Pain Location: Foot Pain Orientation: Left Pain Descriptors / Indicators: Sharp, Nagging Pain Frequency: Constant  Maria Hamilton was last seen on 02/04/2017 for a procedure. At the time we did a diagnostic caudal epidural steroid injection under fluoroscopic guidance and IV sedation aiming at the left side + diagnostic epidurogram. The epidurogram revealed a defect identified starting at the upper endplate of S1 with absolutely no flow of contrast from midbody of L5 up. There was also epidural scar defect identified primarily affecting the left side of the epidural space with no contrast observed over the left L5 and limited S1 nerve root feeling. A "v" shaped defect was also observed along the midline.The procedure was accomplished without any problems or complications. Before the procedure the patient had indicated having a pain score of 8/10 and after the procedure she indicated that he had gone down to a 5/10. The same day of the procedure we received a phone call from the patient indicating that the pain had not gone away. This immediately revealed that the patient had not read the information that we have provided her about the procedure what to expect afterwards. The patient was informed that once the local anesthetic wears off the pain would return occasionally being worse than usual until she obtains the benefit from the steroid. She was also reminded that the steroids may take anywhere from 4-10 days to provide her that benefit. At that time the patient denied any fever, redness, bowel or bladder incontinence, progressive numbness, weakness, or paralysis of an extremity, or any other indications of a serious complication. Every single day after the procedure the patient has been calling complaining about the pain and because of this we have decided to bring the patient in today for an evaluation. Prior to coming into the  clinic we had the patient goal by the lab where  a CBC with differential was drawn. The lab shows no increase or blood cell counts or shifts. The patient comes in initially looking well without any kind of problems or acute distress. Vital signs were all within normal limits, with mildly elevated blood pressure, thought not to be significant due to her prior history of hypertension as well as comparison to blood pressures recorded during other appointments. The patient was not tachycardic or tachypneic. No diaphoresis or elevated temperature. After the patient had been initially seen by the nursing staff, and while waiting for me to go in to see her, she started crying loudly, where she could be heard through intact clinic. The nursing staff returned to the room to see what was going on since minutes earlier she had been completely normal. The question was brought up as to whether or not the patient had any psychiatric history. In view of the patient's moaning and crying, she was given a Toradol 60 mg IM + Norflex 60 mg IM injection. While reviewing her chart, I noticed that she had ran out of pain medicine on 01/19/2017. This patient has a prior long-standing history of opioid use where at 1. she was taking up to 112.5 MME/Day. More recently the patient had been on Percocet 5/325 one tablet every 4 hours (30 mg/day of oxycodone) (45 MME/Day). However, this was discontinued when we learned that the patient was also taking Tylenol No. 3 from [place], Jenise V. Tomasa Blase, MD.  The patient currently complains of pain in the left lower extremity going down to the top of her left foot. She describes this pain as sharp and electrical-like. She also describes that the pain is constant, however, it seems to worsen in a cyclical manner, being worst at around bedtime. When asked, the patient indicated that she took her very last pain pill last night. She also indicates that her leg has given out, but indicated that he was secondary to the sharp pain and not necessarily to  weakness. She does complain of some numbness around the top of her foot and toes. Because of the extent of her complaints, I will go ahead and reorder her lumbar MRI to see if there has been any significant changes compared to the one that we did this past 2017. In addition, I will schedule her to have a nerve conduction test of the lower extremity to see if any conduction pathology can be identified. Meanwhile, she indicates that she has already gone up on her Neurontin to 300 mg at bedtime and therefore I will provide her today with a prescription for Neurontin 300 mg so that she can continue titrating her dose up to a maximum of 900 mg 4 times a day. I did provide the patient with written instructions on how to titrate this medication up. She denies any side effects to the medicine. On 01/15/2017 I provided the patient with a prescription for Flexeril 10 mg to be taken 1 by mouth 3 times a day #270 pills, to last her for 3 months. Today when I asked her about this, she indicated that she has already taken them all. This is a problem because it indicates to me that the patient is not reading any of the instructions that we're providing her or following any of the orders on how to take the medications. Today I provided her with a prescription for Zanaflex 2 mg to be taken 1 tablet every 8 hours #  90 PRN for muscle spasms. In addition to this, I have provided the patient with a taper prescription for prednisone and I have ordered some additional lab work to see if there is anything that can explain her complaints. I renewed her tramadol prescription and today we have provided her with an oral dose of tramadol 100 mg while she was here in the clinic. By the end of the visit, the patient's pain was well under control. The patient was discharged and told to keep her postprocedure return appointment as previously scheduled.  Further details on both, my assessment(s), as well as the proposed treatment plan, please see  below.  Controlled Substance Pharmacotherapy Assessment REMS (Risk Evaluation and Mitigation Strategy)  Analgesic: Tramadol 100 mg 4 times a day (400 mg/day of tramadol) The patient ran out of medication on 01/19/17. MME/day: 40 mg/day.  Previous Analgesic:Oxycodone/APAP 5/325 one tablet every 4 hours (30 mg/dayof oxycodone) Previous MME/day:45mg /day Highest Historical MME/day: 112.5 mg/day Maria Elk, RN  02/07/2017 11:57 AM  Sign at close encounter Safety precautions to be maintained throughout the outpatient stay will include: orient to surroundings, keep bed in low position, maintain call bell within reach at all times, provide assistance with transfer out of bed and ambulation.    Pharmacokinetics: Liberation and absorption (onset of action): WNL Distribution (time to peak effect): WNL Metabolism and excretion (duration of action): WNL         Pharmacodynamics: Desired effects: Analgesia: Ms. Fenning reports <50% benefit. Functional ability: Patient reports being unable to accomplish basic ADLs Clinically meaningful improvement in function (CMIF): Medication does not meet basic CMIF Perceived effectiveness: Described as ineffective and would like to make some changes Undesirable effects: Side-effects or Adverse reactions: None reported Monitoring: [place] PMP: Online review of the past 20-month period conducted. Compliant with practice rules and regulations Drug screening test on record: Note: The patient  reports that she does not use drugs. Lab Results  Component Value Date   MDMA NONE DETECTED 07/13/2016   COCAINSCRNUR NONE DETECTED 07/13/2016   PCPSCRNUR NONE DETECTED 07/13/2016   THCU NONE DETECTED 07/13/2016   ETH <5 07/13/2016   List of all Serum Drug Screening Test(s):  Lab Results  Component Value Date   AMPHSCRSER Negative 09/25/2016   BARBSCRSER Negative 09/25/2016   BENZOSCRSER Negative 09/25/2016   COCAINSCRSER Negative 09/25/2016   PCPSCRSER Negative  09/25/2016   THCSCRSER Negative 09/25/2016   OPIATESCRSER Negative 09/25/2016   OXYSCRSER Negative 09/25/2016   PROPOXSCRSER Negative 09/25/2016   List of all UDS test(s) done:  Lab Results  Component Value Date   TOXASSSELUR FINAL 01/15/2017   SUMMARY FINAL 09/24/2016   Last UDS on record: ToxAssure Select 13  Date Value Ref Range Status  01/15/2017 FINAL  Final    Comment:    ==================================================================== TOXASSURE SELECT 13 (MW) ==================================================================== Test                             Result       Flag       Units Drug Present and Declared for Prescription Verification   Tramadol                       PRESENT      EXPECTED   O-Desmethyltramadol            PRESENT      EXPECTED   N-Desmethyltramadol  PRESENT      EXPECTED    Source of tramadol is a prescription medication.    O-desmethyltramadol and N-desmethyltramadol are expected    metabolites of tramadol. Drug Present not Declared for Prescription Verification   Oxazepam                       41           UNEXPECTED ng/mg creat    Oxazepam may be administered as a scheduled prescription    medication; it is also an expected metabolite of other    benzodiazepine drugs, including diazepam, chlordiazepoxide,    prazepam, clorazepate, halazepam, and temazepam. Drug Absent but Declared for Prescription Verification   Codeine                        Not Detected UNEXPECTED ng/mg creat ==================================================================== Test                      Result    Flag   Units      Ref Range   Creatinine              143              mg/dL      >=44 ==================================================================== Declared Medications:  The flagging and interpretation on this report are based on the  following declared medications.  Unexpected results may arise from  inaccuracies in the declared  medications.  **Note: The testing scope of this panel includes these medications:  Codeine (Tylenol 3)  Tramadol  **Note: The testing scope of this panel does not include following  reported medications:  Acetaminophen (Tylenol 3)  Amlodipine (Norvasc)  Benzonatate (Tessalon)  Citalopram (Lexapro)  Cyanocobalamin  Cyclobenzaprine (Flexeril)  Folic acid (Folvite)  Gabapentin  Hydrochlorothiazide  Ondansetron (Zofran)  Polyethylene Glycol ==================================================================== For clinical consultation, please call [phone]. ====================================================================    Summary  Date Value Ref Range Status  09/24/2016 FINAL  Final    Comment:    ==================================================================== TOXASSURE COMP DRUG ANALYSIS,UR ==================================================================== Test                             Result       Flag       Units Drug Present and Declared for Prescription Verification   Hydrocodone                    1294         EXPECTED   ng/mg creat   Hydromorphone                  104          EXPECTED   ng/mg creat   Dihydrocodeine                 420          EXPECTED   ng/mg creat   Norhydrocodone                 >1142        EXPECTED   ng/mg creat    Sources of hydrocodone include scheduled prescription    medications. Hydromorphone, dihydrocodeine and norhydrocodone are    expected metabolites of hydrocodone. Hydromorphone and    dihydrocodeine are also available as scheduled prescription    medications.   Oxymorphone  39           EXPECTED   ng/mg creat   Noroxymorphone                 19           EXPECTED   ng/mg creat    Sources of oxymorphone are scheduled prescription medications;    oxymorphone is also a metabolite of oxycodone. Noroxymorphone is    an expected metabolite of oxymorphone.   Citalopram                     PRESENT       EXPECTED   Desmethylcitalopram            PRESENT      EXPECTED    Desmethylcitalopram is an expected metabolite of citalopram or    the enantiomeric form, escitalopram.   Acetaminophen                  PRESENT      EXPECTED Drug Present not Declared for Prescription Verification   Orphenadrine                   PRESENT      UNEXPECTED   Ibuprofen                      PRESENT      UNEXPECTED Drug Absent but Declared for Prescription Verification   Oxycodone                      Not Detected UNEXPECTED ng/mg creat    Oxycodone is almost always present in patients taking this drug    consistently.  Absence of oxycodone could be due to lapse of time    since the last dose or unusual pharmacokinetics (rapid    metabolism).   Hydroxyzine                    Not Detected UNEXPECTED ==================================================================== Test                      Result    Flag   Units      Ref Range   Creatinine              438              mg/dL      >=34 ==================================================================== Declared Medications:  The flagging and interpretation on this report are based on the  following declared medications.  Unexpected results may arise from  inaccuracies in the declared medications.  **Note: The testing scope of this panel includes these medications:  Citalopram (Lexapro)  Hydrocodone (Norco)  Hydroxyzine (Atarax)  Oxycodone (Percocet)  **Note: The testing scope of this panel does not include small to  moderate amounts of these reported medications:  Acetaminophen (Norco)  Acetaminophen (Percocet)  **Note: The testing scope of this panel does not include following  reported medications:  Amlodipine (Norvasc)  Hydrochlorothiazide (Hydrodiuril)  Meclizine (Antivert)  Methylprednisolone (Medrol Dose Pack)  Potassium ==================================================================== For clinical consultation, please call [phone]. ====================================================================    UDS interpretation: Unexpected findings: The patient was given a final warning about the accuracy of reporting medications. Medication Assessment Form: Reviewed. Patient indicates being compliant with therapy Treatment compliance: Compliant Risk Assessment Profile: Aberrant behavior: See prior evaluations. None observed or detected today Comorbid factors increasing risk of overdose: See prior notes. No  additional risks detected today Risk of substance use disorder (SUD): Low Opioid Risk Tool (ORT) Total Score:    Interpretation Table:  Score <3 = Low Risk for SUD  Score between 4-7 = Moderate Risk for SUD  Score >8 = High Risk for Opioid Abuse   Risk Mitigation Strategies:  Patient Counseling: Covered Patient-Prescriber Agreement (PPA): Present and active  Notification to other healthcare providers: Done  Pharmacologic Plan: No change in therapy, at this time  Post-Procedure Assessment  02/05/2017 Procedure: Diagnostic caudal epidural steroid injection under fluoroscopic guidance and IV sedation aiming at the left side + diagnostic epidurogram. Post-procedure pain score: 5/10 Before the procedure the patient indicated having a pain score of 8/10 and after the procedure she indicated that he had gone down to a 5/10. Influential Factors: BMI: 28.10 kg/m Intra-procedural challenges: None observed Assessment challenges: Results reported today are inconsistent with those reported on procedure day, immediately before discharge.         Post-procedural side-effects, adverse reactions, or complications: Transient post-procedural increase in pain Reported issues: Persistent worsening of the pain  Sedation: Sedation provided. When no sedatives are used, the analgesic levels obtained are directly associated to the effectiveness of the local anesthetics. However, when sedation is provided, the level of analgesia  obtained during the initial 1 hour following the intervention, is believed to be the result of a combination of factors. These factors may include, but are not limited to: 1. The effectiveness of the local anesthetics used. 2. The effects of the analgesic(s) and/or anxiolytic(s) used. 3. The degree of discomfort experienced by the patient at the time of the procedure. 4. The patients ability and reliability in recalling and recording the events. 5. The presence and influence of possible secondary gains and/or psychosocial factors. Reported result: Relief experienced during the 1st hour after the procedure: 75 % (Ultra-Short Term Relief) Interpretative annotation: Inaccurate and unreliable report. Patient does not appear to have understood instructions on differential evaluation of treated vs untreated area, leading to an inaccurate global report  Effects of local anesthetic: The analgesic effects attained during this period are directly associated to the localized infiltration of local anesthetics and therefore cary significant diagnostic value as to the etiological location, or anatomical origin, of the pain. Expected duration of relief is directly dependent on the pharmacodynamics of the local anesthetic used. Long-acting (4-6 hours) anesthetics used.  Reported result: Relief during the next 4 to 6 hour after the procedure: 0 % (Short-Term Relief) Interpretative annotation: Inaccurate and unreliable report. Patient does not appear to have understood instructions on differential evaluation of treated vs untreated area, leading to an inaccurate global report  Long-term benefit: Defined as the period of time past the expected duration of local anesthetics. With the possible exception of prolonged sympathetic blockade from the local anesthetics, benefits during this period are typically attributed to, or associated with, other factors such as analgesic sensory neuropraxia, antiinflammatory effects, or  beneficial biochemical changes provided by agents other than the local anesthetics Reported result: Extended relief following procedure: 0 % (Long-Term Relief) Interpretative annotation: Good relief. This could suggest inflammation to be a significant component in the etiology to the pain.          Current benefits: Defined as persistent relief that continues at this point in time.   Reported results: Treated area: 0 %       Interpretative annotation: Recurrance of symptoms. This would suggest persistent aggravating factors  Interpretation: Results would suggest that repeating the procedure may  be necessary, for diagnostic reasons  Laboratory Chemistry  Inflammation Markers Lab Results  Component Value Date   ESRSEDRATE 39 (H) 09/25/2016   CRP <0.8 09/25/2016   Renal Function Lab Results  Component Value Date   BUN 7 11/26/2016   CREATININE 0.71 11/26/2016   GFRAA >60 11/15/2016   GFRNONAA >60 11/15/2016   Hepatic Function Lab Results  Component Value Date   AST 17 11/26/2016   ALT 15 11/26/2016   ALBUMIN 3.7 11/26/2016   Electrolytes Lab Results  Component Value Date   NA 137 11/26/2016   K 3.4 (L) 11/26/2016   CL 99 11/26/2016   CALCIUM 9.3 11/26/2016   MG 1.9 09/25/2016   Pain Modulating Vitamins Lab Results  Component Value Date   25OHVITD1 14 (L) 09/25/2016   25OHVITD2 6.4 09/25/2016   25OHVITD3 7.7 09/25/2016   VITAMINB12 269 09/25/2016   Coagulation Parameters Lab Results  Component Value Date   PLT 346 02/07/2017   Cardiovascular Lab Results  Component Value Date   HGB 11.9 (L) 02/07/2017   HCT 35.1 02/07/2017   Note: Lab results reviewed and explained to patient in Layman's terms. The patient did not go back to do the rest of the lab work that I requested done today, despite being told to do so.  Recent Diagnostic Imaging Review  No results found. Note: Imaging results reviewed.          Meds  The patient has a current medication list which  includes the following prescription(s): amlodipine, escitalopram, folic acid, gabapentin, hydrochlorothiazide, polyethylene glycol, prednisone, tizanidine, tramadol, and vitamin b-12.  Current Outpatient Prescriptions on File Prior to Visit  Medication Sig  . amLODipine (NORVASC) 5 MG tablet Take 1 tablet (5 mg total) by mouth daily.  Marland Kitchen escitalopram (LEXAPRO) 20 MG tablet Take 1 tablet (20 mg total) by mouth daily.  . folic acid (FOLVITE) 1 MG tablet Take 1 tablet by mouth 1 day or 1 dose.  . hydrochlorothiazide (HYDRODIURIL) 25 MG tablet Take 1 tablet (25 mg total) by mouth daily.  . polyethylene glycol (MIRALAX / GLYCOLAX) packet Take 17 g by mouth daily.  . vitamin B-12 (CYANOCOBALAMIN) 1000 MCG tablet Take 1,000 mcg by mouth daily.   No current facility-administered medications on file prior to visit.    ROS  Constitutional: Denies any fever or chills Gastrointestinal: No reported hemesis, hematochezia, vomiting, or acute GI distress Musculoskeletal: Denies any acute onset joint swelling, redness, loss of ROM, or weakness Neurological: No reported episodes of acute onset apraxia, aphasia, dysarthria, agnosia, amnesia, paralysis, loss of coordination, or loss of consciousness  Allergies  Ms. Fewell is allergic to ace inhibitors; ciprofloxacin; fentanyl; morphine and related; penicillin g benzathine; and tape.  PFSH  Drug: Ms. Westly  reports that she does not use drugs. Alcohol:  reports that she does not drink alcohol. Tobacco:  reports that she has never smoked. She has never used smokeless tobacco. Medical:  has a past medical history of Acute anxiety (08/03/2015); Acute bronchitis (12/31/2016); Acute pancreatitis (10/14/2016); Altered mental status (10/12/2016); Anxiety; Chronic hip pain; Chronic pain syndrome; Depression; Edema; Fatigue; Fibromyalgia; GERD (gastroesophageal reflux disease); Headache; Hypertension; Insomnia; Low back pain; Right upper quadrant abdominal pain; and  Vitamin D deficiency. Family: family history includes Alcohol abuse in her father; Cancer in her father; Heart disease in her mother.  Past Surgical History:  Procedure Laterality Date  . ABDOMINAL HYSTERECTOMY    . BACK SURGERY    . BREAST SURGERY    .  CHOLECYSTECTOMY N/A 10/16/2016   Procedure: LAPAROSCOPIC CHOLECYSTECTOMY WITH INTRAOPERATIVE CHOLANGIOGRAM;  Surgeon: Tiney Rouge III, MD;  Location: [facility] ORS;  Service: General;  Laterality: N/A;  . cyst removal from wrist    . ECTOPIC PREGNANCY SURGERY    . HIP SURGERY     x4   Constitutional Exam  General appearance: Well nourished, well developed, and well hydrated. In no apparent acute distress Vitals:   02/07/17 1154  BP: (!) 154/91  Pulse: 85  Resp: 16  Temp: 98.1 F (36.7 C)  TempSrc: Oral  SpO2: 100%  Weight: 213 lb (96.6 kg)  Height: 6\' 1"  (1.854 m)   BMI Assessment: Estimated body mass index is 28.1 kg/m as calculated from the following:   Height as of this encounter: 6\' 1"  (1.854 m).   Weight as of this encounter: 213 lb (96.6 kg).  BMI interpretation table: BMI level Category Range association with higher incidence of chronic pain  <18 kg/m2 Underweight   18.5-24.9 kg/m2 Ideal body weight   25-29.9 kg/m2 Overweight Increased incidence by 20%  30-34.9 kg/m2 Obese (Class I) Increased incidence by 68%  35-39.9 kg/m2 Severe obesity (Class II) Increased incidence by 136%  >40 kg/m2 Extreme obesity (Class III) Increased incidence by 254%   BMI Readings from Last 4 Encounters:  02/07/17 28.10 kg/m  02/04/17 28.10 kg/m  01/09/17 28.63 kg/m  12/31/16 26.47 kg/m   Wt Readings from Last 4 Encounters:  02/07/17 213 lb (96.6 kg)  02/04/17 213 lb (96.6 kg)  01/09/17 217 lb (98.4 kg)  12/31/16 200 lb 9.6 oz (91 kg)  Psych/Mental status: Alert, oriented x 3 (person, place, & time)       Eyes: PERLA Respiratory: No evidence of acute respiratory distress  Cervical Spine Exam  Inspection: No masses, redness, or  swelling Alignment: Symmetrical Functional ROM: Unrestricted ROM Stability: No instability detected Muscle strength & Tone: Functionally intact Sensory: Unimpaired Palpation: Non-contributory  Upper Extremity (UE) Exam    Side: Right upper extremity  Side: Left upper extremity  Inspection: No masses, redness, swelling, or asymmetry. No contractures  Inspection: No masses, redness, swelling, or asymmetry. No contractures  Functional ROM: Unrestricted ROM          Functional ROM: Unrestricted ROM          Muscle strength & Tone: Functionally intact  Muscle strength & Tone: Functionally intact  Sensory: Unimpaired  Sensory: Unimpaired  Palpation: Euthermic  Palpation: Euthermic  Specialized Test(s): Deferred         Specialized Test(s): Deferred          Thoracic Spine Exam  Inspection: No masses, redness, or swelling Alignment: Symmetrical Functional ROM: Unrestricted ROM Stability: No instability detected Sensory: Unimpaired Muscle strength & Tone: Functionally intact Palpation: Non-contributory  Lumbar Spine Exam  Inspection: No masses, redness, or swelling Alignment: Symmetrical Functional ROM: Unrestricted ROM Stability: No instability detected Muscle strength & Tone: Functionally intact Sensory: Unimpaired Palpation: Non-contributory Provocative Tests: Lumbar Hyperextension and rotation test: evaluation deferred today       Patrick's Maneuver: evaluation deferred today              Gait & Posture Assessment  Ambulation: Patient came in today in a wheel chair Gait: Antalgic Posture: Antalgic   Lower Extremity Exam    Side: Right lower extremity  Side: Left lower extremity  Inspection: No masses, redness, swelling, or asymmetry. No contractures  Inspection: No masses, redness, swelling, or asymmetry. No contractures  Functional ROM: Unrestricted ROM  Functional ROM: Decreased ROM for hip and knee joints  Muscle strength & Tone: Functionally intact  Muscle  strength & Tone: Functionally intact  Sensory: Unimpaired  Sensory: Dermatomal pain pattern  Palpation: No palpable anomalies  Palpation: Complains of area being tender to palpation   Assessment  Primary Diagnosis & Pertinent Problem List: The primary encounter diagnosis was Chronic low back pain (Location of Primary Source of Pain) (Left). Diagnoses of Chronic lower extremity pain (Location of Secondary source of pain) (Left), Chronic lumbar radicular pain (L5/S1 dermatome) (Location of Secondary source of pain) (Left), Failed back surgical syndrome (x 3), Acute midline low back pain with sciatica, sciatica laterality unspecified, Chronic pain syndrome, Neurogenic pain, and Musculoskeletal pain were also pertinent to this visit.  Status Diagnosis  Controlled Worsening Worsening 1. Chronic low back pain (Location of Primary Source of Pain) (Left)   2. Chronic lower extremity pain (Location of Secondary source of pain) (Left)   3. Chronic lumbar radicular pain (L5/S1 dermatome) (Location of Secondary source of pain) (Left)   4. Failed back surgical syndrome (x 3)   5. Acute midline low back pain with sciatica, sciatica laterality unspecified   6. Chronic pain syndrome   7. Neurogenic pain   8. Musculoskeletal pain      Plan of Care  Pharmacotherapy (Medications Ordered): Meds ordered this encounter  Medications  . orphenadrine (NORFLEX) injection 60 mg  . ketorolac (TORADOL) injection 60 mg  . traMADol (ULTRAM) tablet 100 mg  . tizanidine (ZANAFLEX) 2 MG capsule    Sig: Take 1 capsule (2 mg total) by mouth 3 (three) times daily as needed for muscle spasms.    Dispense:  90 capsule    Refill:  0    Do not place this medication, or any other prescription from our practice, on "Automatic Refill". Patient may have prescription filled one day early if pharmacy is closed on scheduled refill date.  . traMADol (ULTRAM) 50 MG tablet    Sig: Take 2 tablets (100 mg total) by mouth every 6 (six)  hours as needed.    Dispense:  240 tablet    Refill:  0    Fill one day early if pharmacy is closed on scheduled refill date. Do not fill until: 02/07/17 To last until: 03/09/17  . gabapentin (NEURONTIN) 300 MG capsule    Sig: Take 1-3 capsules (300-900 mg total) by mouth every 8 (eight) hours. Follow titration schedule.    Dispense:  270 capsule    Refill:  0    Do not place this medication, or any other prescription from our practice, on "Automatic Refill". Patient may have prescription filled one day early if pharmacy is closed on scheduled refill date.  . predniSONE (DELTASONE) 20 MG tablet    Sig: Take 3 tab(s) in the morning x 3 days, then 2 tab(s) x 3 days, followed by 1 tab x 3 days.    Dispense:  21 tablet    Refill:  0    Do not add to the "Automatic Refill" notification system.   New Prescriptions   GABAPENTIN (NEURONTIN) 300 MG CAPSULE    Take 1-3 capsules (300-900 mg total) by mouth every 8 (eight) hours. Follow titration schedule.   PREDNISONE (DELTASONE) 20 MG TABLET    Take 3 tab(s) in the morning x 3 days, then 2 tab(s) x 3 days, followed by 1 tab x 3 days.   TIZANIDINE (ZANAFLEX) 2 MG CAPSULE    Take 1 capsule (2 mg total)  by mouth 3 (three) times daily as needed for muscle spasms.   Medications administered today: We administered orphenadrine, ketorolac, and traMADol. Lab-work, procedure(s), and/or referral(s): Orders Placed This Encounter  Procedures  . MR LUMBAR SPINE W WO CONTRAST  . C-reactive protein  . Comprehensive metabolic panel  . Sedimentation rate  . NCV with EMG(electromyography)   Imaging and/or referral(s): MR LUMBAR SPINE W WO CONTRAST  Interventional therapies: Planned, scheduled, and/or pending:   Repeat lumbar MRI with and without contrast to evaluate epidural fibrosis. Lower extremity EMG/PNCV  Follow-up sedimentation rate and C-reactive protein lab work.    Considering:   Diagnostic left L5-S1 transforaminal epidural steroid injection.   Repeat diagnostic left sided caudal epidural steroid injection +epidurogram. Possible RACZepidurolysis of adhesions.   Palliative PRN treatment(s):   None at this time.    Provider-requested follow-up: Return in about 1 month (around 03/07/2017) for (MD) Med-Mgmt, evaluation of med trial.  Future Appointments Date Time Provider Department Center  03/07/2017 8:15 AM Delano Metz, MD [facility] None   Primary Care Physician: Glori Luis, MD Location: [facility] Note by: Sydnee Levans. Laban Emperor, M.D, DABA, DABAPM, DABPM, DABIPP, FIPP Date: 02/07/2017; Time: 7:08 PM  Pain Score Disclaimer: We use the NRS-11 scale. This is a self-reported, subjective measurement of pain severity with only modest accuracy. It is used primarily to identify changes within a particular patient. It must be understood that outpatient pain scales are significantly less accurate that those used for research, where they can be applied under ideal controlled circumstances with minimal exposure to variables. In reality, the score is likely to be a combination of pain intensity and pain affect, where pain affect describes the degree of emotional arousal or changes in action readiness caused by the sensory experience of pain. Factors such as social and work situation, setting, emotional state, anxiety levels, expectation, and prior pain experience may influence pain perception and show large inter-individual differences that may also be affected by time variables.  Patient instructions provided during this appointment: Patient Instructions   Pain Score  Introduction: The pain score used by this practice is the Verbal Numerical Rating Scale (VNRS-11). This is an 11-point scale. It is for adults and children 10 years or older. There are significant differences in how the pain score is reported, used, and applied. Forget everything you learned in the past and learn this scoring  system.  General Information: The scale should reflect your current level of pain. Unless you are specifically asked for the level of your worst pain, or your average pain. If you are asked for one of these two, then it should be understood that it is over the past 24 hours.  Basic Activities of Daily Living (ADL): Personal hygiene, dressing, eating, transferring, and using restroom.  Instructions: Most patients tend to report their level of pain as a combination of two factors, their physical pain and their psychosocial pain. This last one is also known as "suffering" and it is reflection of how physical pain affects you socially and psychologically. From now on, report them separately. From this point on, when asked to report your pain level, report only your physical pain. Use the following table for reference.  Pain Clinic Pain Levels (0-5/10)  Pain Level Score Description  No Pain 0   Mild pain 1 Nagging, annoying, but does not interfere with basic activities of daily living (ADL). Patients are able to eat, bathe, get dressed, toileting (being able to get on and off  the toilet and perform personal hygiene functions), transfer (move in and out of bed or a chair without assistance), and maintain continence (able to control bladder and bowel functions). Blood pressure and heart rate are unaffected. A normal heart rate for a healthy adult ranges from 60 to 100 bpm (beats per minute).   Mild to moderate pain 2 Noticeable and distracting. Impossible to hide from other people. More frequent flare-ups. Still possible to adapt and function close to normal. It can be very annoying and may have occasional stronger flare-ups. With discipline, patients may get used to it and adapt.   Moderate pain 3 Interferes significantly with activities of daily living (ADL). It becomes difficult to feed, bathe, get dressed, get on and off the toilet or to perform personal hygiene functions. Difficult to get in and out of  bed or a chair without assistance. Very distracting. With effort, it can be ignored when deeply involved in activities.   Moderately severe pain 4 Impossible to ignore for more than a few minutes. With effort, patients may still be able to manage work or participate in some social activities. Very difficult to concentrate. Signs of autonomic nervous system discharge are evident: dilated pupils (mydriasis); mild sweating (diaphoresis); sleep interference. Heart rate becomes elevated (>115 bpm). Diastolic blood pressure (lower number) rises above 100 mmHg. Patients find relief in laying down and not moving.   Severe pain 5 Intense and extremely unpleasant. Associated with frowning face and frequent crying. Pain overwhelms the senses.  Ability to do any activity or maintain social relationships becomes significantly limited. Conversation becomes difficult. Pacing back and forth is common, as getting into a comfortable position is nearly impossible. Pain wakes you up from deep sleep. Physical signs will be obvious: pupillary dilation; increased sweating; goosebumps; brisk reflexes; cold, clammy hands and feet; nausea, vomiting or dry heaves; loss of appetite; significant sleep disturbance with inability to fall asleep or to remain asleep. When persistent, significant weight loss is observed due to the complete loss of appetite and sleep deprivation.  Blood pressure and heart rate becomes significantly elevated. Caution: If elevated blood pressure triggers a pounding headache associated with blurred vision, then the patient should immediately seek attention at an urgent or emergency care unit, as these may be signs of an impending stroke.    Emergency Department Pain Levels (6-10/10)  Emergency Room Pain 6 Severely limiting. Requires emergency care and should not be seen or managed at an outpatient pain management facility. Communication becomes difficult and requires great effort. Assistance to reach the  emergency department may be required. Facial flushing and profuse sweating along with potentially dangerous increases in heart rate and blood pressure will be evident.   Distressing pain 7 Self-care is very difficult. Assistance is required to transport, or use restroom. Assistance to reach the emergency department will be required. Tasks requiring coordination, such as bathing and getting dressed become very difficult.   Disabling pain 8 Self-care is no longer possible. At this level, pain is disabling. The individual is unable to do even the most "basic" activities such as walking, eating, bathing, dressing, transferring to a bed, or toileting. Fine motor skills are lost. It is difficult to think clearly.   Incapacitating pain 9 Pain becomes incapacitating. Thought processing is no longer possible. Difficult to remember your own name. Control of movement and coordination are lost.   The worst pain imaginable 10 At this level, most patients pass out from pain. When this level is reached, collapse of the  autonomic nervous system occurs, leading to a sudden drop in blood pressure and heart rate. This in turn results in a temporary and dramatic drop in blood flow to the brain, leading to a loss of consciousness. Fainting is one of the body's self defense mechanisms. Passing out puts the brain in a calmed state and causes it to shut down for a while, in order to begin the healing process.    Summary: 1. Refer to this scale when providing [place] with your pain level. 2. Be accurate and careful when reporting your pain level. This will help with your care. 3. Over-reporting your pain level will lead to loss of credibility. 4. Even a level of 1/10 means that there is pain and will be treated at our facility. 5. High, inaccurate reporting will be documented as "Symptom Exaggeration", leading to loss of credibility and suspicions of possible secondary gains such as obtaining more narcotics, or wanting to appear  disabled, for fraudulent reasons. 6. Only pain levels of 5 or below will be seen at our facility. 7. Pain levels of 6 and above will be sent to the Emergency Department and the appointment cancelled. _____________________________________________________________________________________________  Gabapentin Titration  Medication used: Gabapentin (Generic Name) or Neurontin (Brand Name) 300 mg tablets/capsules  Reasons to stop increasing the dose:  Reason 1: You get good relief of symptoms, in which case there is no need to increase the daily dose any further.    Reason 2: You develop some side effects, such as sleeping all of the time, difficulty concentrating, or becoming disoriented, in which case you need to go down on the dose, to the prior level, where you were not experiencing any side effects. Stay on that dose longer, to allow more time for your body to get use it, before attempting to increase it again.   Reasons to stop increasing the dose: Reason 1: You get good relief of symptoms, in which case there is no need to increase the daily dose any further.  Reason 2: You develop some side effects, such as sleeping all of the time, difficulty concentrating, or becoming disoriented, in which case you need to go down on the dose, to the prior level, where you were not experiencing any side effects. Stay on that dose longer, to allow more time for your body to get use it, before attempting to increase it again.  Steps to increase medication: Step 1: Start by taking 1 (one) tablet at bedtime x 7 (seven) days.  Step 2: After 7 (seven) days of taking 1 (one) tablet at bedtime, increase it to 2 (two) tablets at bedtime. Stay on this dose x 7 (seven) days.  Step 3: After 7 (seven) days of taking 2 (two) tablets at bedtime, increase it to 3 (three) tablets at bedtime. Stay on this dose x another 7 (seven) days.  Step 4: After 7 (seven) days of taking 3 (three) tablet at bedtime, begin taking 1  (one) tablet at noon with lunch. Stay on this dose x another 7 (seven) days.  Step 5: After 7 (seven) days of taking 3 (three) tablet at bedtime, and 1 (one) tablet at noon, then begin taking 1 (one) tablet in the afternoon with dinner. Stay on this dose x another 7 (seven) days.  Step 6: After 7 (seven) days of taking 3 (three) tablet at bedtime, 1 (one) tablet at noon, and 1 (one) tablet in the afternoon, then begin taking 1 (one) tablet in the morning with breakfast. Stay on  this dose x another 7 (seven) days. At this point you should be taking the medicine 4 (four) times a day, or about every 6 (six) hours. This daily regimen of taking the medicine 4 (four) times a day, will be maintained from now on. You should not take any doses any sooner than every 6 (six) hours.  Step 7: After 7 (seven) days of taking 3 (three) tablet at bedtime, 1 (one) tablet at noon, 1 (one) tablet in the afternoon, and 1 (one) tablet in the morning, begin taking 2 (two) tablets at noon with lunch. Stay on this dose x another 7 (seven) days.   Step 8: After 7 (seven) days of taking 3 (three) tablet at bedtime, 2 (two) tablets at noon, 1 (one) tablet in the afternoon, and 1 (one) tablet in the morning, begin taking 2 (two) tablets in the afternoon with dinner. Stay on this dose x another 7 (seven) days.   Step 9: After 7 (seven) days of taking 3 (three) tablet at bedtime, 2 (two) tablets at noon, 2 (two) tablets in the afternoon, and 1 (one) tablet in the morning, begin taking 2 (two) tablets in the morning with breakfast. Stay on this dose x another 7 (seven) days. At this point you should be taking the medicine 4 (four) times a day, or about every 6 (six) hours. This daily regimen of taking the medicine 4 (four) times a day, will be maintained from now on. You should not take any doses any sooner than every 6 (six) hours.  Step 10: After 7 (seven) days of taking 3 (three) tablet at bedtime, 2 (two) tablets at noon, 2 (two)  tablets in the afternoon, and 2 (two) tablets in the morning, begin taking 3 (three) tablets at noon with lunch. Stay on this dose x another 7 (seven) days.   Step 11: After 7 (seven) days of taking 3 (three) tablet at bedtime, 3 (three) tablets at noon, 2 (two) tablets in the afternoon, and 2 (two) tablets in the morning, begin taking 3 (three) tablets in the afternoon with dinner. Stay on this dose x another 7 (seven) days.   Step 12: After 7 (seven) days of taking 3 (three) tablet at bedtime, 3 (three) tablets at noon, 3 (three) tablets in the afternoon, and 2 (two) tablet in the morning, begin taking 3 (three) tablets in the morning with breakfast. Stay on this dose x another 7 (seven) days. At this point you should be taking the medicine 4 (four) times a day, or about every 6 (six) hours. This daily regimen of taking the medicine 4 (four) times a day, will be maintained from now on.   Endpoint: Once you have reached the maximum dose you can tolerate without side-effects, contact your physician so as to evaluate the results of the regimen.   Questions: Feel free to contact us for any questions or problems at [phone]

## 2017-02-07 NOTE — Telephone Encounter (Signed)
Patient called back and was given an appointment today at 1145.  Dr Dossie Arbour notified.

## 2017-02-13 ENCOUNTER — Ambulatory Visit: Payer: Medicare HMO | Admitting: Pain Medicine

## 2017-03-05 ENCOUNTER — Telehealth: Payer: Self-pay

## 2017-03-05 NOTE — Telephone Encounter (Signed)
Patient is in pain complains of not being able to walk pain is in her back and left foot. Patient called crying

## 2017-03-05 NOTE — Telephone Encounter (Signed)
Called patient and went over medications and pain assessment. Patient states that it is the pain she normally has but it has gotten worse over the last several days. Encouraged patient to take medications as ordered, and to return call as needed. Patient has MRI scheduled for tomorrow and follow up with [place] on Thursday 03/07/2017. Patient confirms both appointments and I reinforced importance of completing MRI and keeping her follow up appointment with Dr. Dossie Arbour to discuss her exacerbation of pain. No other new symptoms reported at time of phone call.

## 2017-03-06 ENCOUNTER — Ambulatory Visit (INDEPENDENT_AMBULATORY_CARE_PROVIDER_SITE_OTHER): Payer: Medicare HMO | Admitting: Family Medicine

## 2017-03-06 ENCOUNTER — Encounter: Payer: Self-pay | Admitting: Family Medicine

## 2017-03-06 ENCOUNTER — Ambulatory Visit
Admission: RE | Admit: 2017-03-06 | Discharge: 2017-03-06 | Disposition: A | Discharge status: Home / Self Care | Payer: Medicare HMO | Source: Ambulatory Visit | Attending: Pain Medicine | Admitting: Pain Medicine

## 2017-03-06 ENCOUNTER — Telehealth: Payer: Self-pay | Admitting: Family Medicine

## 2017-03-06 VITALS — BP 120/78 | HR 93 | Temp 98.0°F | Wt 202.2 lb

## 2017-03-06 DIAGNOSIS — M5126 Other intervertebral disc displacement, lumbar region: Secondary | ICD-10-CM | POA: Diagnosis not present

## 2017-03-06 DIAGNOSIS — M79605 Pain in left leg: Secondary | ICD-10-CM | POA: Diagnosis not present

## 2017-03-06 DIAGNOSIS — B029 Zoster without complications: Secondary | ICD-10-CM | POA: Insufficient documentation

## 2017-03-06 DIAGNOSIS — M5442 Lumbago with sciatica, left side: Secondary | ICD-10-CM | POA: Diagnosis not present

## 2017-03-06 DIAGNOSIS — G8929 Other chronic pain: Secondary | ICD-10-CM | POA: Diagnosis not present

## 2017-03-06 DIAGNOSIS — M961 Postlaminectomy syndrome, not elsewhere classified: Secondary | ICD-10-CM | POA: Diagnosis not present

## 2017-03-06 DIAGNOSIS — M5440 Lumbago with sciatica, unspecified side: Secondary | ICD-10-CM

## 2017-03-06 DIAGNOSIS — G629 Polyneuropathy, unspecified: Secondary | ICD-10-CM | POA: Insufficient documentation

## 2017-03-06 DIAGNOSIS — R413 Other amnesia: Secondary | ICD-10-CM | POA: Diagnosis not present

## 2017-03-06 DIAGNOSIS — E876 Hypokalemia: Secondary | ICD-10-CM

## 2017-03-06 DIAGNOSIS — M48061 Spinal stenosis, lumbar region without neurogenic claudication: Secondary | ICD-10-CM | POA: Insufficient documentation

## 2017-03-06 DIAGNOSIS — M5416 Radiculopathy, lumbar region: Secondary | ICD-10-CM | POA: Insufficient documentation

## 2017-03-06 LAB — COMPREHENSIVE METABOLIC PANEL
ALBUMIN: 3.9 g/dL (ref 3.5–5.2)
ALT: 23 U/L (ref 0–35)
AST: 26 U/L (ref 0–37)
Alkaline Phosphatase: 114 U/L (ref 39–117)
BUN: 5 mg/dL — AB (ref 6–23)
CHLORIDE: 90 meq/L — AB (ref 96–112)
CO2: 27 mEq/L (ref 19–32)
Calcium: 9.8 mg/dL (ref 8.4–10.5)
Creatinine, Ser: 0.84 mg/dL (ref 0.40–1.20)
GFR: 87.26 mL/min (ref >60.00)
Glucose, Bld: 140 mg/dL — ABNORMAL HIGH (ref 70–99)
POTASSIUM: 2.5 meq/L — AB (ref 3.5–5.1)
SODIUM: 132 meq/L — AB (ref 135–145)
Total Bilirubin: 0.6 mg/dL (ref 0.2–1.2)
Total Protein: 8.1 g/dL (ref 6.0–8.3)

## 2017-03-06 LAB — CBC
HEMATOCRIT: 38.3 % (ref 36.0–46.0)
HEMOGLOBIN: 12.7 g/dL (ref 12.0–15.0)
MCHC: 33.2 g/dL (ref 30.0–36.0)
MCV: 82 fl (ref 78.0–100.0)
Platelets: 351 10*3/uL (ref 150.0–400.0)
RBC: 4.67 Mil/uL (ref 3.87–5.11)
RDW: 16.7 % — AB (ref 11.5–15.5)
WBC: 7.3 10*3/uL (ref 4.0–10.5)

## 2017-03-06 LAB — POCT I-STAT CREATININE: Creatinine, Ser: 0.8 mg/dL (ref 0.44–1.00)

## 2017-03-06 LAB — VITAMIN B12: Vitamin B-12: 446 pg/mL (ref 211–911)

## 2017-03-06 LAB — TSH: TSH: 0.38 u[IU]/mL (ref 0.35–4.50)

## 2017-03-06 MED ORDER — VALACYCLOVIR HCL 1 G PO TABS
1000.0000 mg | ORAL_TABLET | Freq: Three times a day (TID) | ORAL | 0 refills | Status: DC
Start: 2017-03-06 — End: 2017-08-13

## 2017-03-06 MED ORDER — GADOBENATE DIMEGLUMINE 529 MG/ML IV SOLN
20.0000 mL | Freq: Once | INTRAVENOUS | Status: AC | PRN
  Administered 2017-03-06: 20 mL via INTRAVENOUS

## 2017-03-06 MED ORDER — POTASSIUM CHLORIDE CRYS ER 20 MEQ PO TBCR: 40.0000 meq | EXTENDED_RELEASE_TABLET | Freq: Two times a day (BID) | ORAL | 0 refills | Status: DC

## 2017-03-06 NOTE — Progress Notes (Signed)
Maria Rumps, MD Phone: [phone]  Maria Hamilton is a 66 y.o. female who presents today for same-day visit.  Patient notes she woke up this morning with a rash on her right low back and right flank. Notes it itches a little bit. Does have some pain associated with it. Has no contacts with this. No new soaps or detergents. No new medications. No fevers. She had an MRI this morning of her low back and this was there prior to receiving the contrast with that.  Patient additionally notes she's been having some memory issues over the last 3-4 weeks. She'll forget what time she needs to pick people up and then will forget what her husband just told her as well. She does note she has forgotten where she is going on occasion. She notes no depression. She notes no new medications. She's not had any fevers. She is oriented to person, location, city, state, though not oriented to country. She is able to repeat her name and her birthday. She was able to call the office to let us know she was going to possibly be late. She got 2 out of 3 words on mini cog testing and was unable to complete the clock draw with being unable to place the minute hand at the appropriate location. She is able to recount in detail the issues with needing to pick people up at a specific time.  PMH: nonsmoker.   ROS see history of present illness  Objective  Physical Exam Vitals:   03/06/17 1312  BP: 120/78  Pulse: 93  Temp: 98 F (36.7 C)    BP Readings from Last 3 Encounters:  03/06/17 120/78  02/07/17 (!) 154/91  02/04/17 130/76   Wt Readings from Last 3 Encounters:  03/06/17 202 lb 3.2 oz (91.7 kg)  02/07/17 213 lb (96.6 kg)  02/04/17 213 lb (96.6 kg)    Physical Exam  Constitutional: No distress.  HENT:  Mouth/Throat: Oropharynx is clear and moist. No oropharyngeal exudate.  Eyes: Conjunctivae are normal. Pupils are equal, round, and reactive to light.  Neurological: She is alert.  CN 2-12 intact,  5/5 strength in bilateral biceps, triceps, grip, quads, hamstrings, plantar and dorsiflexion, sensation to light touch intact in bilateral UE and LE, normal gait  Skin: Skin is warm and dry. She is not diaphoretic.  Patient with right-sided papulovesicular rash on a bed of erythema limited to L2 and L3 dermatomes with possible L1 involvement, there is abrupt discontinuation of the rash at the midline, it wraps around from posterior lumbar region to her right side  Patient unable to fully complete the clock draw on the mini cog test, she did get 2 out of 3 words  Assessment/Plan: Please see individual problem list.  Shingles Patient's rash is consistent with shingles. Discussed precautions with this and keeping it covered until lesions crust. Discussed avoiding immunocompromised people, pregnant women, and people who have not had chickenpox. We will treat with Valtrex. If not improving she'll let us know.  Memory difficulty Patient reports several weeks of memory difficulty. She is alert and has no confusion today. She is intermittently tearful throughout the exam though denies depression. She gets 2 out of 3 words on 3 word recall. Unable to complete the clock draw on the mini cog test. Potentially this could be related to depression as she does have a history of this. She is also on several medications that could be contributing to memory issues. She does not appear to have any  acute illness at this time leading to this. She appears to be at her baseline in interacting with me other than that she is tearful. Discussed obtaining lab work to start with and referring to neurology for further evaluation.   Orders Placed This Encounter  Procedures  . Comp Met (CMET)  . CBC  . TSH  . B12  . HIV antibody (with reflex)  . Ambulatory referral to Neurology    Referral Priority:   Routine    Referral Type:   Consultation    Referral Reason:   Specialty Services Required    Requested Specialty:    Neurology    Number of Visits Requested:   1    Meds ordered this encounter  Medications  . valACYclovir (VALTREX) 1000 MG tablet    Sig: Take 1 tablet (1,000 mg total) by mouth 3 (three) times daily.    Dispense:  21 tablet    Refill:  0    Maria Rumps, MD [place]

## 2017-03-06 NOTE — Assessment & Plan Note (Signed)
Patient reports several weeks of memory difficulty. She is alert and has no confusion today. She is intermittently tearful throughout the exam though denies depression. She gets 2 out of 3 words on 3 word recall. Unable to complete the clock draw on the mini cog test. Potentially this could be related to depression as she does have a history of this. She is also on several medications that could be contributing to memory issues. She does not appear to have any acute illness at this time leading to this. She appears to be at her baseline in interacting with me other than that she is tearful. Discussed obtaining lab work to start with and referring to neurology for further evaluation.

## 2017-03-06 NOTE — Telephone Encounter (Signed)
Called and spoke with patient regarding critical low on potassium. Potassium was found to be 2.5. She had no muscle weakness in the office on full neurological exam. Advised her to discontinue her hydrochlorothiazide. We will send in potassium supplementation for her to start on. She's had this happen previously on her hydrochlorothiazide. We will completely discontinue it at this time. Discussed if she were to develop palpitations or chest pain or any new symptoms she should be evaluated. We'll plan on rechecking her potassium on Friday.

## 2017-03-06 NOTE — Progress Notes (Signed)
Pre visit review using our clinic review tool, if applicable. No additional management support is needed unless otherwise documented below in the visit note. 

## 2017-03-06 NOTE — Patient Instructions (Signed)
Nice to see you. You have shingles. We will treat with Valtrex. You need to avoid pregnant women, people who have not had chickenpox, and anybody that's immunocompromised. You need to keep the area covered until the lesions crust. We will obtain some lab work to evaluate you for your memory difficulties. We will also refer you to neurology for further evaluation. If your memory issues get worse please let us know. If the rash gets significantly worse or becomes excessively painful please let us know as well.

## 2017-03-06 NOTE — Assessment & Plan Note (Signed)
Patient's rash is consistent with shingles. Discussed precautions with this and keeping it covered until lesions crust. Discussed avoiding immunocompromised people, pregnant women, and people who have not had chickenpox. We will treat with Valtrex. If not improving she'll let us know.

## 2017-03-07 ENCOUNTER — Ambulatory Visit: Payer: Medicare HMO | Admitting: Pain Medicine

## 2017-03-07 NOTE — Telephone Encounter (Signed)
Order placed

## 2017-03-07 NOTE — Telephone Encounter (Signed)
Patient scheduled for Friday please place order

## 2017-03-08 ENCOUNTER — Other Ambulatory Visit (INDEPENDENT_AMBULATORY_CARE_PROVIDER_SITE_OTHER): Payer: Medicare HMO

## 2017-03-08 ENCOUNTER — Telehealth: Payer: Self-pay | Admitting: Family Medicine

## 2017-03-08 DIAGNOSIS — E876 Hypokalemia: Secondary | ICD-10-CM

## 2017-03-08 LAB — BASIC METABOLIC PANEL
BUN: 5 mg/dL — AB (ref 6–23)
CALCIUM: 9.6 mg/dL (ref 8.4–10.5)
CO2: 33 mEq/L — ABNORMAL HIGH (ref 19–32)
CREATININE: 0.75 mg/dL (ref 0.40–1.20)
Chloride: 95 mEq/L — ABNORMAL LOW (ref 96–112)
GFR: 99.45 mL/min (ref >60.00)
Glucose, Bld: 108 mg/dL — ABNORMAL HIGH (ref 70–99)
Potassium: 2.9 mEq/L — ABNORMAL LOW (ref 3.5–5.1)
Sodium: 137 mEq/L (ref 135–145)

## 2017-03-08 MED ORDER — POTASSIUM CHLORIDE CRYS ER 20 MEQ PO TBCR: 40.0000 meq | EXTENDED_RELEASE_TABLET | Freq: Two times a day (BID) | ORAL | 0 refills | Status: DC

## 2017-03-08 NOTE — Telephone Encounter (Signed)
Spoke with patient regarding labs. K is 2.9. She had only taken 2 doses of potassium supplement. She will complete the supplement she has and I will send in an additional day and she will come in on Monday for repeat potassium. Orders placed. CMA will contact patient to get this set up.

## 2017-03-09 LAB — HIV ANTIBODY (ROUTINE TESTING W REFLEX): HIV: NONREACTIVE

## 2017-03-11 ENCOUNTER — Other Ambulatory Visit: Payer: Medicare HMO

## 2017-03-11 NOTE — Telephone Encounter (Signed)
Patient is scheduled for potassium

## 2017-03-12 ENCOUNTER — Encounter: Payer: Self-pay | Admitting: Pain Medicine

## 2017-03-12 ENCOUNTER — Ambulatory Visit: Payer: Medicare HMO | Attending: Pain Medicine | Admitting: Pain Medicine

## 2017-03-12 VITALS — BP 132/70 | HR 80 | Temp 97.9°F | Resp 16 | Ht 73.0 in | Wt 198.0 lb

## 2017-03-12 DIAGNOSIS — J209 Acute bronchitis, unspecified: Secondary | ICD-10-CM | POA: Diagnosis not present

## 2017-03-12 DIAGNOSIS — Z811 Family history of alcohol abuse and dependence: Secondary | ICD-10-CM | POA: Insufficient documentation

## 2017-03-12 DIAGNOSIS — M47816 Spondylosis without myelopathy or radiculopathy, lumbar region: Secondary | ICD-10-CM

## 2017-03-12 DIAGNOSIS — R937 Abnormal findings on diagnostic imaging of other parts of musculoskeletal system: Secondary | ICD-10-CM | POA: Insufficient documentation

## 2017-03-12 DIAGNOSIS — I10 Essential (primary) hypertension: Secondary | ICD-10-CM | POA: Diagnosis not present

## 2017-03-12 DIAGNOSIS — M791 Myalgia: Secondary | ICD-10-CM

## 2017-03-12 DIAGNOSIS — M5442 Lumbago with sciatica, left side: Secondary | ICD-10-CM

## 2017-03-12 DIAGNOSIS — G894 Chronic pain syndrome: Secondary | ICD-10-CM | POA: Insufficient documentation

## 2017-03-12 DIAGNOSIS — Z96643 Presence of artificial hip joint, bilateral: Secondary | ICD-10-CM | POA: Diagnosis not present

## 2017-03-12 DIAGNOSIS — Z79899 Other long term (current) drug therapy: Secondary | ICD-10-CM | POA: Insufficient documentation

## 2017-03-12 DIAGNOSIS — K219 Gastro-esophageal reflux disease without esophagitis: Secondary | ICD-10-CM | POA: Insufficient documentation

## 2017-03-12 DIAGNOSIS — B0229 Other postherpetic nervous system involvement: Secondary | ICD-10-CM | POA: Insufficient documentation

## 2017-03-12 DIAGNOSIS — M961 Postlaminectomy syndrome, not elsewhere classified: Secondary | ICD-10-CM | POA: Diagnosis not present

## 2017-03-12 DIAGNOSIS — E876 Hypokalemia: Secondary | ICD-10-CM | POA: Insufficient documentation

## 2017-03-12 DIAGNOSIS — M7918 Myalgia, other site: Secondary | ICD-10-CM

## 2017-03-12 DIAGNOSIS — Z809 Family history of malignant neoplasm, unspecified: Secondary | ICD-10-CM | POA: Diagnosis not present

## 2017-03-12 DIAGNOSIS — M5416 Radiculopathy, lumbar region: Secondary | ICD-10-CM

## 2017-03-12 DIAGNOSIS — Z8249 Family history of ischemic heart disease and other diseases of the circulatory system: Secondary | ICD-10-CM | POA: Diagnosis not present

## 2017-03-12 DIAGNOSIS — M1288 Other specific arthropathies, not elsewhere classified, other specified site: Secondary | ICD-10-CM | POA: Diagnosis not present

## 2017-03-12 DIAGNOSIS — E559 Vitamin D deficiency, unspecified: Secondary | ICD-10-CM | POA: Insufficient documentation

## 2017-03-12 DIAGNOSIS — F329 Major depressive disorder, single episode, unspecified: Secondary | ICD-10-CM | POA: Diagnosis not present

## 2017-03-12 DIAGNOSIS — M797 Fibromyalgia: Secondary | ICD-10-CM | POA: Insufficient documentation

## 2017-03-12 DIAGNOSIS — M069 Rheumatoid arthritis, unspecified: Secondary | ICD-10-CM | POA: Diagnosis not present

## 2017-03-12 DIAGNOSIS — Z9049 Acquired absence of other specified parts of digestive tract: Secondary | ICD-10-CM | POA: Diagnosis not present

## 2017-03-12 DIAGNOSIS — F419 Anxiety disorder, unspecified: Secondary | ICD-10-CM | POA: Diagnosis not present

## 2017-03-12 DIAGNOSIS — G4700 Insomnia, unspecified: Secondary | ICD-10-CM | POA: Insufficient documentation

## 2017-03-12 DIAGNOSIS — G8929 Other chronic pain: Secondary | ICD-10-CM

## 2017-03-12 DIAGNOSIS — Z79891 Long term (current) use of opiate analgesic: Secondary | ICD-10-CM | POA: Diagnosis not present

## 2017-03-12 DIAGNOSIS — M5116 Intervertebral disc disorders with radiculopathy, lumbar region: Secondary | ICD-10-CM | POA: Insufficient documentation

## 2017-03-12 DIAGNOSIS — K859 Acute pancreatitis without necrosis or infection, unspecified: Secondary | ICD-10-CM | POA: Insufficient documentation

## 2017-03-12 DIAGNOSIS — M792 Neuralgia and neuritis, unspecified: Secondary | ICD-10-CM | POA: Diagnosis not present

## 2017-03-12 DIAGNOSIS — R002 Palpitations: Secondary | ICD-10-CM | POA: Insufficient documentation

## 2017-03-12 DIAGNOSIS — F1190 Opioid use, unspecified, uncomplicated: Secondary | ICD-10-CM | POA: Diagnosis not present

## 2017-03-12 DIAGNOSIS — Z9889 Other specified postprocedural states: Secondary | ICD-10-CM | POA: Insufficient documentation

## 2017-03-12 DIAGNOSIS — M79605 Pain in left leg: Secondary | ICD-10-CM | POA: Insufficient documentation

## 2017-03-12 DIAGNOSIS — Z90710 Acquired absence of both cervix and uterus: Secondary | ICD-10-CM | POA: Diagnosis not present

## 2017-03-12 DIAGNOSIS — K5900 Constipation, unspecified: Secondary | ICD-10-CM | POA: Diagnosis not present

## 2017-03-12 MED ORDER — ORPHENADRINE CITRATE 30 MG/ML IJ SOLN
60.0000 mg | Freq: Once | INTRAMUSCULAR | Status: AC
  Administered 2017-03-12: 60 mg via INTRAMUSCULAR

## 2017-03-12 MED ORDER — TIZANIDINE HCL 2 MG PO CAPS: 2.0000 mg | ORAL_CAPSULE | Freq: Three times a day (TID) | ORAL | 0 refills | Status: DC | PRN

## 2017-03-12 MED ORDER — TRAMADOL HCL 50 MG PO TABS: 100.0000 mg | ORAL_TABLET | Freq: Four times a day (QID) | ORAL | 0 refills | Status: DC | PRN

## 2017-03-12 MED ORDER — GABAPENTIN 300 MG PO CAPS: 300.0000 mg | ORAL_CAPSULE | Freq: Three times a day (TID) | ORAL | 0 refills | Status: DC

## 2017-03-12 MED ORDER — KETOROLAC TROMETHAMINE 60 MG/2ML IM SOLN
INTRAMUSCULAR | Status: AC
  Filled 2017-03-12: qty 2

## 2017-03-12 MED ORDER — KETOROLAC TROMETHAMINE 60 MG/2ML IM SOLN
60.0000 mg | Freq: Once | INTRAMUSCULAR | Status: AC
  Administered 2017-03-12: 60 mg via INTRAMUSCULAR

## 2017-03-12 MED ORDER — ORPHENADRINE CITRATE 30 MG/ML IJ SOLN
INTRAMUSCULAR | Status: AC
  Filled 2017-03-12: qty 2

## 2017-03-12 NOTE — Patient Instructions (Addendum)
You have Gabapentin and Tizanidine at the pharmacy to be picked up. You have been given a script for Tramadol today with 0 refills.  You have been given pre procedure instructions.  Do not eat or drink for 8 hours.  Bring a driver, take blood pressure medication the morning of the procedure if applicable.      Pain Score  Introduction: The pain score used by this practice is the Verbal Numerical Rating Scale (VNRS-11). This is an 11-point scale. It is for adults and children 10 years or older. There are significant differences in how the pain score is reported, used, and applied. Forget everything you learned in the past and learn this scoring system.  General Information: The scale should reflect your current level of pain. Unless you are specifically asked for the level of your worst pain, or your average pain. If you are asked for one of these two, then it should be understood that it is over the past 24 hours.  Basic Activities of Daily Living (ADL): Personal hygiene, dressing, eating, transferring, and using restroom.  Instructions: Most patients tend to report their level of pain as a combination of two factors, their physical pain and their psychosocial pain. This last one is also known as "suffering" and it is reflection of how physical pain affects you socially and psychologically. From now on, report them separately. From this point on, when asked to report your pain level, report only your physical pain. Use the following table for reference.  Pain Clinic Pain Levels (0-5/10)  Pain Level Score Description  No Pain 0   Mild pain 1 Nagging, annoying, but does not interfere with basic activities of daily living (ADL). Patients are able to eat, bathe, get dressed, toileting (being able to get on and off the toilet and perform personal hygiene functions), transfer (move in and out of bed or a chair without assistance), and maintain continence (able to control bladder and bowel functions).  Blood pressure and heart rate are unaffected. A normal heart rate for a healthy adult ranges from 60 to 100 bpm (beats per minute).   Mild to moderate pain 2 Noticeable and distracting. Impossible to hide from other people. More frequent flare-ups. Still possible to adapt and function close to normal. It can be very annoying and may have occasional stronger flare-ups. With discipline, patients may get used to it and adapt.   Moderate pain 3 Interferes significantly with activities of daily living (ADL). It becomes difficult to feed, bathe, get dressed, get on and off the toilet or to perform personal hygiene functions. Difficult to get in and out of bed or a chair without assistance. Very distracting. With effort, it can be ignored when deeply involved in activities.   Moderately severe pain 4 Impossible to ignore for more than a few minutes. With effort, patients may still be able to manage work or participate in some social activities. Very difficult to concentrate. Signs of autonomic nervous system discharge are evident: dilated pupils (mydriasis); mild sweating (diaphoresis); sleep interference. Heart rate becomes elevated (>115 bpm). Diastolic blood pressure (lower number) rises above 100 mmHg. Patients find relief in laying down and not moving.   Severe pain 5 Intense and extremely unpleasant. Associated with frowning face and frequent crying. Pain overwhelms the senses.  Ability to do any activity or maintain social relationships becomes significantly limited. Conversation becomes difficult. Pacing back and forth is common, as getting into a comfortable position is nearly impossible. Pain wakes you up from  deep sleep. Physical signs will be obvious: pupillary dilation; increased sweating; goosebumps; brisk reflexes; cold, clammy hands and feet; nausea, vomiting or dry heaves; loss of appetite; significant sleep disturbance with inability to fall asleep or to remain asleep. When persistent,  significant weight loss is observed due to the complete loss of appetite and sleep deprivation.  Blood pressure and heart rate becomes significantly elevated. Caution: If elevated blood pressure triggers a pounding headache associated with blurred vision, then the patient should immediately seek attention at an urgent or emergency care unit, as these may be signs of an impending stroke.    Emergency Department Pain Levels (6-10/10)  Emergency Room Pain 6 Severely limiting. Requires emergency care and should not be seen or managed at an outpatient pain management facility. Communication becomes difficult and requires great effort. Assistance to reach the emergency department may be required. Facial flushing and profuse sweating along with potentially dangerous increases in heart rate and blood pressure will be evident.   Distressing pain 7 Self-care is very difficult. Assistance is required to transport, or use restroom. Assistance to reach the emergency department will be required. Tasks requiring coordination, such as bathing and getting dressed become very difficult.   Disabling pain 8 Self-care is no longer possible. At this level, pain is disabling. The individual is unable to do even the most "basic" activities such as walking, eating, bathing, dressing, transferring to a bed, or toileting. Fine motor skills are lost. It is difficult to think clearly.   Incapacitating pain 9 Pain becomes incapacitating. Thought processing is no longer possible. Difficult to remember your own name. Control of movement and coordination are lost.   The worst pain imaginable 10 At this level, most patients pass out from pain. When this level is reached, collapse of the autonomic nervous system occurs, leading to a sudden drop in blood pressure and heart rate. This in turn results in a temporary and dramatic drop in blood flow to the brain, leading to a loss of consciousness. Fainting is one of the body's self defense  mechanisms. Passing out puts the brain in a calmed state and causes it to shut down for a while, in order to begin the healing process.    Summary: 1. Refer to this scale when providing [place] with your pain level. 2. Be accurate and careful when reporting your pain level. This will help with your care. 3. Over-reporting your pain level will lead to loss of credibility. 4. Even a level of 1/10 means that there is pain and will be treated at our facility. 5. High, inaccurate reporting will be documented as "Symptom Exaggeration", leading to loss of credibility and suspicions of possible secondary gains such as obtaining more narcotics, or wanting to appear disabled, for fraudulent reasons. 6. Only pain levels of 5 or below will be seen at our facility. 7. Pain levels of 6 and above will be sent to the Emergency Department and the appointment cancelled. _____________________________________________________________________________________________  Epidural Steroid Injection Patient Information  Description: The epidural space surrounds the nerves as they exit the spinal cord.  In some patients, the nerves can be compressed and inflamed by a bulging disc or a tight spinal canal (spinal stenosis).  By injecting steroids into the epidural space, we can bring irritated nerves into direct contact with a potentially helpful medication.  These steroids act directly on the irritated nerves and can reduce swelling and inflammation which often leads to decreased pain.  Epidural steroids may be injected anywhere along the  spine and from the neck to the low back depending upon the location of your pain.   After numbing the skin with local anesthetic (like Novocaine), a small needle is passed into the epidural space slowly.  You may experience a sensation of pressure while this is being done.  The entire block usually last less than 10 minutes.  Conditions which may be treated by epidural steroids:   Low back and  leg pain  Neck and arm pain  Spinal stenosis  Post-laminectomy syndrome  Herpes zoster (shingles) pain  Pain from compression fractures  Preparation for the injection:  1. Do not eat any solid food or dairy products within 8 hours of your appointment.  2. You may drink clear liquids up to 3 hours before appointment.  Clear liquids include water, black coffee, juice or soda.  No milk or cream please. 3. You may take your regular medication, including pain medications, with a sip of water before your appointment  Diabetics should hold regular insulin (if taken separately) and take 1/2 normal NPH dos the morning of the procedure.  Carry some sugar containing items with you to your appointment. 4. A driver must accompany you and be prepared to drive you home after your procedure.  5. Bring all your current medications with your. 6. An IV may be inserted and sedation may be given at the discretion of the physician.   7. A blood pressure cuff, EKG and other monitors will often be applied during the procedure.  Some patients may need to have extra oxygen administered for a short period. 8. You will be asked to provide medical information, including your allergies, prior to the procedure.  We must know immediately if you are taking blood thinners (like Coumadin/Warfarin)  Or if you are allergic to IV iodine contrast (dye). We must know if you could possible be pregnant.  Possible side-effects:  Bleeding from needle site  Infection (rare, may require surgery)  Nerve injury (rare)  Numbness & tingling (temporary)  Difficulty urinating (rare, temporary)  Spinal headache ( a headache worse with upright posture)  Light -headedness (temporary)  Pain at injection site (several days)  Decreased blood pressure (temporary)  Weakness in arm/leg (temporary)  Pressure sensation in back/neck (temporary)  Call if you experience:  Fever/chills associated with headache or increased back/neck  pain.  Headache worsened by an upright position.  New onset weakness or numbness of an extremity below the injection site  Hives or difficulty breathing (go to the emergency room)  Inflammation or drainage at the infection site  Severe back/neck pain  Any new symptoms which are concerning to you  Please note:  Although the local anesthetic injected can often make your back or neck feel good for several hours after the injection, the pain will likely return.  It takes 3-7 days for steroids to work in the epidural space.  You may not notice any pain relief for at least that one week.  If effective, we will often do a series of three injections spaced 3-6 weeks apart to maximally decrease your pain.  After the initial series, we generally will wait several months before considering a repeat injection of the same type.  If you have any questions, please call [phone] [facility] Clinic[facility] Patient Information  Description: Specific nerve roots exit the spinal canal and these nerves can be compressed and inflamed by a bulging disc and bone spurs.  By injecting steroids on the  nerve root, we can potentially decrease the inflammation surrounding these nerves, which often leads to decreased pain.  Also, by injecting local anesthesia on the nerve root, this can provide [place] helpful information to give to your referring doctor if it decreases your pain.  Selective nerve root blocks can be done along the spine from the neck to the low back depending on the location of your pain.   After numbing the skin with local anesthesia, a small needle is passed to the nerve root and the position of the needle is verified using x-ray pictures.  After the needle is in correct position, we then deposit the medication.  You may experience a pressure sensation while this is being done.  The entire block usually lasts less than 15 minutes.  Conditions that may be  treated with selective nerve root blocks:  Low back and leg pain  Spinal stenosis  Diagnostic block prior to potential surgery  Neck and arm pain  Post laminectomy syndrome  Preparation for the injection:  9. Do not eat any solid food or dairy products within 8 hours of your appointment. 10. You may drink clear liquids up to 3 hours before an appointment.  Clear liquids include water, black coffee, juice or soda.  No milk or cream please. 11. You may take your regular medications, including pain medications, with a sip of water before your appointment.  Diabetics should hold regular insulin (if taken separately) and take 1/2 normal NPH dose the morning of the procedure.  Carry some sugar containing items with you to your appointment. 12. A driver must accompany you and be prepared to drive you home after your procedure. 9. Bring all your current medications with you. 14. An IV may be inserted and sedation may be given at the discretion of the physician. 15. A blood pressure cuff, EKG, and other monitors will often be applied during the procedure.  Some patients may need to have extra oxygen administered for a short period. 74. You will be asked to provide medical information, including allergies, prior to the procedure.  We must know immediately if you are taking blood  Thinners (like Coumadin) or if you are allergic to IV iodine contrast (dye).  Possible side-effects: All are usually temporary  Bleeding from needle site  Light headedness  Numbness and tingling  Decreased blood pressure  Weakness in arms/legs  Pressure sensation in back/neck  Pain at injection site (several days)  Possible complications: All are extremely rare  Infection  Nerve injury  Spinal headache (a headache wore with upright position)  Call if you experience:  Fever/chills associated with headache or increased back/neck pain  Headache worsened by an upright position  New onset weakness  or numbness of an extremity below the injection site  Hives or difficulty breathing (go to the emergency room)  Inflammation or drainage at the injection site(s)  Severe back/neck pain greater than usual  New symptoms which are concerning to you  Please note:  Although the local anesthetic injected can often make your back or neck feel good for several hours after the injection the pain will likely return.  It takes 3-5 days for steroids to work on the nerve root. You may not notice any pain relief for at least one week.  If effective, we will often do a series of 3 injections spaced 3-6 weeks apart to maximally decrease your pain.    If you have any questions, please call [phone] [facility]  Gabapentin Titration  Medication used: Gabapentin (Generic Name) or Neurontin (Brand Name) 300 mg tablets/capsules  Reasons to stop increasing the dose:  Reason 1: You get good relief of symptoms, in which case there is no need to increase the daily dose any further.    Reason 2: You develop some side effects, such as sleeping all of the time, difficulty concentrating, or becoming disoriented, in which case you need to go down on the dose, to the prior level, where you were not experiencing any side effects. Stay on that dose longer, to allow more time for your body to get use it, before attempting to increase it again.   Reasons to stop increasing the dose: Reason 1: You get good relief of symptoms, in which case there is no need to increase the daily dose any further.  Reason 2: You develop some side effects, such as sleeping all of the time, difficulty concentrating, or becoming disoriented, in which case you need to go down on the dose, to the prior level, where you were not experiencing any side effects. Stay on that dose longer, to allow more time for your body to get use it, before attempting to increase it again.  Steps to increase  medication: Step 1: Start by taking 1 (one) tablet at bedtime x 7 (seven) days.  Step 2: After 7 (seven) days of taking 1 (one) tablet at bedtime, increase it to 2 (two) tablets at bedtime. Stay on this dose x 7 (seven) days.  Step 3: After 7 (seven) days of taking 2 (two) tablets at bedtime, increase it to 3 (three) tablets at bedtime. Stay on this dose x another 7 (seven) days.  Step 4: After 7 (seven) days of taking 3 (three) tablet at bedtime, begin taking 1 (one) tablet at noon with lunch. Stay on this dose x another 7 (seven) days.  Step 5: After 7 (seven) days of taking 3 (three) tablet at bedtime, and 1 (one) tablet at noon, then begin taking 1 (one) tablet in the afternoon with dinner. Stay on this dose x another 7 (seven) days.  Step 6: After 7 (seven) days of taking 3 (three) tablet at bedtime, 1 (one) tablet at noon, and 1 (one) tablet in the afternoon, then begin taking 1 (one) tablet in the morning with breakfast. Stay on this dose x another 7 (seven) days. At this point you should be taking the medicine 4 (four) times a day, or about every 6 (six) hours. This daily regimen of taking the medicine 4 (four) times a day, will be maintained from now on. You should not take any doses any sooner than every 6 (six) hours.  Step 7: After 7 (seven) days of taking 3 (three) tablet at bedtime, 1 (one) tablet at noon, 1 (one) tablet in the afternoon, and 1 (one) tablet in the morning, begin taking 2 (two) tablets at noon with lunch. Stay on this dose x another 7 (seven) days.   Step 8: After 7 (seven) days of taking 3 (three) tablet at bedtime, 2 (two) tablets at noon, 1 (one) tablet in the afternoon, and 1 (one) tablet in the morning, begin taking 2 (two) tablets in the afternoon with dinner. Stay on this dose x another 7 (seven) days.   Step 9: After 7 (seven) days of taking 3 (three) tablet at bedtime, 2 (two) tablets at noon, 2 (two) tablets in the afternoon, and 1 (one) tablet in the morning,  begin taking 2 (two) tablets in the  morning with breakfast. Stay on this dose x another 7 (seven) days. At this point you should be taking the medicine 4 (four) times a day, or about every 6 (six) hours. This daily regimen of taking the medicine 4 (four) times a day, will be maintained from now on. You should not take any doses any sooner than every 6 (six) hours.  Step 10: After 7 (seven) days of taking 3 (three) tablet at bedtime, 2 (two) tablets at noon, 2 (two) tablets in the afternoon, and 2 (two) tablets in the morning, begin taking 3 (three) tablets at noon with lunch. Stay on this dose x another 7 (seven) days.   Step 11: After 7 (seven) days of taking 3 (three) tablet at bedtime, 3 (three) tablets at noon, 2 (two) tablets in the afternoon, and 2 (two) tablets in the morning, begin taking 3 (three) tablets in the afternoon with dinner. Stay on this dose x another 7 (seven) days.   Step 12: After 7 (seven) days of taking 3 (three) tablet at bedtime, 3 (three) tablets at noon, 3 (three) tablets in the afternoon, and 2 (two) tablet in the morning, begin taking 3 (three) tablets in the morning with breakfast. Stay on this dose x another 7 (seven) days. At this point you should be taking the medicine 4 (four) times a day, or about every 6 (six) hours. This daily regimen of taking the medicine 4 (four) times a day, will be maintained from now on.   Endpoint: Once you have reached the maximum dose you can tolerate without side-effects, contact your physician so as to evaluate the results of the regimen.   Questions: Feel free to contact us for any questions or problems at [phone]  Preparing for Procedure with Sedation Instructions: . Oral Intake: Do not eat or drink anything for at least 8 hours prior to your procedure. . Transportation: Public transportation is not allowed. Bring an adult driver. The driver must be physically present in our waiting room before any procedure can be  started. Marland Kitchen Physical Assistance: Bring an adult physically capable of assisting you, in the event you need help. This adult should keep you company at home for at least 6 hours after the procedure. . Blood Pressure Medicine: Take your blood pressure medicine with a sip of water the morning of the procedure. . Blood thinners:  . Diabetics on insulin: Notify the staff so that you can be scheduled 1st case in the morning. If your diabetes requires high dose insulin, take only  of your normal insulin dose the morning of the procedure and notify the staff that you have done so. . Preventing infections: Shower with an antibacterial soap the morning of your procedure. . Build-up your immune system: Take 1000 mg of Vitamin C with every meal (3 times a day) the day prior to your procedure. Marland Kitchen Antibiotics: Inform the staff if you have a condition or reason that requires you to take antibiotics before dental procedures. . Pregnancy: If you are pregnant, call and cancel the procedure. . Sickness: If you have a cold, fever, or any active infections, call and cancel the procedure. . Arrival: You must be in the facility at least 30 minutes prior to your scheduled procedure. . Children: Do not bring children with you. . Dress appropriately: Bring dark clothing that you would not mind if they get stained. . Valuables: Do not bring any jewelry or valuables. Procedure appointments are reserved for interventional treatments only. Marland Kitchen No Prescription  Refills. . No medication changes will be discussed during procedure appointments. . No disability issues will be discussed.  ____________________________________________________________________________________________

## 2017-03-12 NOTE — Progress Notes (Signed)
Nursing Pain Medication Assessment:  Safety precautions to be maintained throughout the outpatient stay will include: orient to surroundings, keep bed in low position, maintain call bell within reach at all times, provide assistance with transfer out of bed and ambulation.  Medication Inspection Compliance: Maria Hamilton did not comply with our request to bring her pills to be counted. She was reminded that bringing the medication bottles, even when empty, is a requirement. Pill/Patch Count: None available to be counted. Bottle Appearance: No container available. Did not bring bottle(s) to appointment. Medication: None brought in. Filled Date: N/A Last Medication intake:  Ran out of medicine more than 48 hours ago

## 2017-03-12 NOTE — Progress Notes (Signed)
Patient's Name: Maria Hamilton  MRN: [id number]  Referring Provider: Glori Luis, MD  DOB: 1951/09/25  PCP: Glori Luis, MD  DOS: 03/12/2017  Note by: Sydnee Levans. Laban Emperor, MD  Service setting: Ambulatory outpatient  Specialty: Interventional Pain Management  Location: [facility] (AMB) Pain Management Facility    Patient type: Established   Primary Reason(s) for Visit: Encounter for prescription drug management (Level of risk: moderate) CC: Back Pain (lower right)  HPI  Maria Hamilton is a 66 y.o. year old, female patient, who comes today for a medication management evaluation. She has GERD (gastroesophageal reflux disease); Rheumatoid factor positive; Fatigue; Vitamin D deficiency; Insomnia; Fibromyalgia; Chronic low back pain (Location of Primary Source of Pain) (Left); Chronic pain syndrome; Depression; Hypertension; 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); Long term current use of opiate analgesic; Long term prescription opiate use; Opiate use; Encounter for therapeutic drug level monitoring; Encounter for pain management planning; Rheumatoid arthritis, multiple sites (positive RF); Chronic lower extremity pain (Location of Secondary source of pain) (Left); Lumbar facet arthropathy (Bilateral); Failed back surgical syndrome (x 3); Epidural fibrosis; Neurogenic pain; Musculoskeletal pain; Chronic lumbar radicular pain (L5/S1 dermatome) (Location of Secondary source of pain) (Left); History of total hip replacement, bilateral; Hypokalemia; Cholecystitis; Constipation; Therapeutic opioid-induced constipation (OIC); Palpitations; Epigastric pain; Shingles; Memory difficulty; Polyneuropathy (HCC); Post herpetic neuralgia; and Abnormal MRI, lumbar spine (03/06/2017) on her problem list. Her primarily concern today is the Back Pain (lower right)  Pain Assessment: Self-Reported Pain Score: 10-Worst pain ever/10 Clinically the patient looks like a 4/10 Reported level is inconsistent  with clinical observations. Information on the proper use of the pain scale provided to the patient today Pain Type: Chronic pain Pain Location: Back Pain Orientation: Lower Pain Descriptors / Indicators: Sharp, Nagging Pain Frequency: Intermittent  Maria Hamilton was last scheduled for an appointment on 02/07/2017 for medication management. During today's appointment we reviewed Maria Hamilton chronic pain status, as well as her outpatient medication regimen.  The patient  reports that she does not use drugs. Her body mass index is 26.12 kg/m.  Further details on both, my assessment(s), as well as the proposed treatment plan, please see below.  Controlled Substance Pharmacotherapy Assessment REMS (Risk Evaluation and Mitigation Strategy)  Analgesic: Tramadol 100 mg 4 times a day (400 mg/day of tramadol) The patient ran out of medication on 01/19/17. MME/day: 40 mg/day.   Vernie Ammons, RN  03/12/2017  1:39 PM  Sign at close encounter Nursing Pain Medication Assessment:  Safety precautions to be maintained throughout the outpatient stay will include: orient to surroundings, keep bed in low position, maintain call bell within reach at all times, provide assistance with transfer out of bed and ambulation.  Medication Inspection Compliance: Maria Hamilton did not comply with our request to bring her pills to be counted. She was reminded that bringing the medication bottles, even when empty, is a requirement. Pill/Patch Count: None available to be counted. Bottle Appearance: No container available. Did not bring bottle(s) to appointment. Medication: None brought in. Filled Date: N/A Last Medication intake:  Ran out of medicine more than 48 hours ago   Pharmacokinetics: Liberation and absorption (onset of action): WNL Distribution (time to peak effect): WNL Metabolism and excretion (duration of action): WNL         Pharmacodynamics: Desired effects: Analgesia: Maria Hamilton reports >50%  benefit. Functional ability: Patient reports that medication allows her to accomplish basic ADLs Clinically meaningful improvement in function (CMIF): Sustained CMIF goals met Perceived effectiveness:  Described as relatively effective, allowing for increase in activities of daily living (ADL) Undesirable effects: Side-effects or Adverse reactions: None reported Monitoring: [place] PMP: Online review of the past 52-month period conducted. Compliant with practice rules and regulations List of all UDS test(s) done:  Lab Results  Component Value Date   TOXASSSELUR FINAL 01/15/2017   SUMMARY FINAL 09/24/2016   Last UDS on record: ToxAssure Select 13  Date Value Ref Range Status  01/15/2017 FINAL  Final    Comment:    ==================================================================== TOXASSURE SELECT 13 (MW) ==================================================================== Test                             Result       Flag       Units Drug Present and Declared for Prescription Verification   Tramadol                       PRESENT      EXPECTED   O-Desmethyltramadol            PRESENT      EXPECTED   N-Desmethyltramadol            PRESENT      EXPECTED    Source of tramadol is a prescription medication.    O-desmethyltramadol and N-desmethyltramadol are expected    metabolites of tramadol. Drug Present not Declared for Prescription Verification   Oxazepam                       41           UNEXPECTED ng/mg creat    Oxazepam may be administered as a scheduled prescription    medication; it is also an expected metabolite of other    benzodiazepine drugs, including diazepam, chlordiazepoxide,    prazepam, clorazepate, halazepam, and temazepam. Drug Absent but Declared for Prescription Verification   Codeine                        Not Detected UNEXPECTED ng/mg creat ==================================================================== Test                      Result    Flag   Units       Ref Range   Creatinine              143              mg/dL      >=09 ==================================================================== Declared Medications:  The flagging and interpretation on this report are based on the  following declared medications.  Unexpected results may arise from  inaccuracies in the declared medications.  **Note: The testing scope of this panel includes these medications:  Codeine (Tylenol 3)  Tramadol  **Note: The testing scope of this panel does not include following  reported medications:  Acetaminophen (Tylenol 3)  Amlodipine (Norvasc)  Benzonatate (Tessalon)  Citalopram (Lexapro)  Cyanocobalamin  Cyclobenzaprine (Flexeril)  Folic acid (Folvite)  Gabapentin  Hydrochlorothiazide  Ondansetron (Zofran)  Polyethylene Glycol ==================================================================== For clinical consultation, please call [phone]. ====================================================================    UDS interpretation: Compliant          Medication Assessment Form: Reviewed. Patient indicates being compliant with therapy Treatment compliance: Compliant Risk Assessment Profile: Aberrant behavior: See prior evaluations. None observed or detected today Comorbid factors increasing risk of overdose: See prior notes. No additional risks detected  today Risk of substance use disorder (SUD): Low Opioid Risk Tool (ORT) Total Score: 1  Interpretation Table:  Score <3 = Low Risk for SUD  Score between 4-7 = Moderate Risk for SUD  Score >8 = High Risk for Opioid Abuse   Risk Mitigation Strategies:  Patient Counseling: Covered Patient-Prescriber Agreement (PPA): Present and active  Notification to other healthcare providers: Done  Pharmacologic Plan: No change in therapy, at this time  Laboratory Chemistry  Inflammation Markers Lab Results  Component Value Date   CRP <0.8 09/25/2016   ESRSEDRATE 39 (H) 09/25/2016   (CRP: Acute  Phase) (ESR: Chronic Phase) Renal Function Markers Lab Results  Component Value Date   BUN 5 (L) 03/08/2017   CREATININE 0.75 03/08/2017   GFRAA >60 11/15/2016   GFRNONAA >60 11/15/2016   Hepatic Function Markers Lab Results  Component Value Date   AST 26 03/06/2017   ALT 23 03/06/2017   ALBUMIN 3.9 03/06/2017   ALKPHOS 114 03/06/2017   Electrolytes Lab Results  Component Value Date   NA 137 03/08/2017   K 2.9 (L) 03/08/2017   CL 95 (L) 03/08/2017   CALCIUM 9.6 03/08/2017   MG 1.9 09/25/2016   Neuropathy Markers Lab Results  Component Value Date   VITAMINB12 446 03/06/2017   Bone Pathology Markers Lab Results  Component Value Date   ALKPHOS 114 03/06/2017   25OHVITD1 14 (L) 09/25/2016   25OHVITD2 6.4 09/25/2016   25OHVITD3 7.7 09/25/2016   CALCIUM 9.6 03/08/2017   Coagulation Parameters Lab Results  Component Value Date   PLT 351.0 03/06/2017   Cardiovascular Markers Lab Results  Component Value Date   HGB 12.7 03/06/2017   HCT 38.3 03/06/2017   Note: Lab results reviewed.  Recent Diagnostic Imaging Review  Mr Lumbar Spine W Wo Contrast Result Date: 03/06/2017 CLINICAL DATA:  Low back pain for 1 week. Pain extends into the left lower extremity to the foot. Numbness in the foot. Multiple prior lumbar spine surgeries. EXAM: MRI LUMBAR SPINE WITHOUT AND WITH CONTRAST TECHNIQUE: Multiplanar and multiecho pulse sequences of the lumbar spine were obtained without and with intravenous contrast. CONTRAST:  20mL MULTIHANCE GADOBENATE DIMEGLUMINE 529 MG/ML IV SOLN COMPARISON:  MRI of the lumbar spine 07/20/2016 FINDINGS: Segmentation: 5 non rib-bearing lumbar type vertebral bodies are present. Alignment: AP alignment is anatomic. No significant scoliosis is present. Vertebrae: Mild endplate marrow changes are noted anteriorly at L1-2. Marrow signal and vertebral body heights are otherwise normal. There is no pathologic enhancement within the marrow containing spaces.  There is some artifact from hardware at L5-S1. Conus medullaris: Extends to the L1 level and appears normal. Paraspinal and other soft tissues: Right renal cysts is stable. Limited imaging of the abdomen is otherwise unremarkable. No significant adenopathy is present. Disc levels: L1-2: Mild facet hypertrophy and disc bulging is stable. No significant stenosis or change is present. L2-3: A broad-based disc protrusion is asymmetric to the left. Moderate facet hypertrophy is seen bilaterally. There is slight progression of mild left subarticular and foraminal stenosis. The right foramen is patent. L3-4: Solid fusion is stable. No residual or recurrent stenosis is present. L4-5: Solid fusion is stable. No residual or recurrent stenosis is present. L5-S1: Posterior lumbar fusion is noted. Previously noted soft tissue in the left foramen is less evident on today's study. There is no residual or recurrent stenosis. IMPRESSION: 1. Progressive facet arthropathy and a broad-based disc protrusion at L2-3 with slight progression of mild left subarticular and foraminal stenosis. 2.  Stable solid fusion at L3-4 L4-5 without residual or recurrent stenosis at either level. 3. Posterior lumbar fusion at L5-S1 without significant residual or recurrent stenosis. Electronically Signed   By: Marin Roberts M.D.   On: 03/06/2017 12:27   Note: Imaging results reviewed.          Meds  The patient has a current medication list which includes the following prescription(s): amlodipine, escitalopram, gabapentin, potassium chloride sa, tizanidine, tramadol, valacyclovir, and vitamin b-12.  Current Outpatient Prescriptions on File Prior to Visit  Medication Sig  . amLODipine (NORVASC) 5 MG tablet Take 1 tablet (5 mg total) by mouth daily.  Marland Kitchen escitalopram (LEXAPRO) 20 MG tablet Take 1 tablet (20 mg total) by mouth daily.  . potassium chloride SA (K-DUR,KLOR-CON) 20 MEQ tablet Take 2 tablets (40 mEq total) by mouth 2 (two) times  daily.  . valACYclovir (VALTREX) 1000 MG tablet Take 1 tablet (1,000 mg total) by mouth 3 (three) times daily.  . vitamin B-12 (CYANOCOBALAMIN) 1000 MCG tablet Take 1,000 mcg by mouth daily.   No current facility-administered medications on file prior to visit.    ROS  Constitutional: Denies any fever or chills Gastrointestinal: No reported hemesis, hematochezia, vomiting, or acute GI distress Musculoskeletal: Denies any acute onset joint swelling, redness, loss of ROM, or weakness Neurological: No reported episodes of acute onset apraxia, aphasia, dysarthria, agnosia, amnesia, paralysis, loss of coordination, or loss of consciousness  Allergies  Maria Hamilton is allergic to ace inhibitors; ciprofloxacin; fentanyl; morphine and related; penicillin g benzathine; and tape.  PFSH  Drug: Maria Hamilton  reports that she does not use drugs. Alcohol:  reports that she does not drink alcohol. Tobacco:  reports that she has never smoked. She has never used smokeless tobacco. Medical:  has a past medical history of Acute anxiety (08/03/2015); Acute bronchitis (12/31/2016); Acute pancreatitis (10/14/2016); Altered mental status (10/12/2016); Anxiety; Chronic hip pain; Chronic pain syndrome; Depression; Edema; Fatigue; Fibromyalgia; GERD (gastroesophageal reflux disease); Headache; Hypertension; Insomnia; Low back pain; Right upper quadrant abdominal pain; and Vitamin D deficiency. Family: family history includes Alcohol abuse in her father; Cancer in her father; Heart disease in her mother.  Past Surgical History:  Procedure Laterality Date  . ABDOMINAL HYSTERECTOMY    . BACK SURGERY    . BREAST SURGERY    . CHOLECYSTECTOMY N/A 10/16/2016   Procedure: LAPAROSCOPIC CHOLECYSTECTOMY WITH INTRAOPERATIVE CHOLANGIOGRAM;  Surgeon: Tiney Rouge III, MD;  Location: [facility] ORS;  Service: General;  Laterality: N/A;  . cyst removal from wrist    . ECTOPIC PREGNANCY SURGERY    . HIP SURGERY     x4   Constitutional  Exam  General appearance: Well nourished, well developed, and well hydrated. In no apparent acute distress Vitals:   03/12/17 1331  BP: 132/70  Pulse: 80  Resp: 16  Temp: 97.9 F (36.6 C)  TempSrc: Oral  SpO2: 100%  Weight: 198 lb (89.8 kg)  Height: 6\' 1"  (1.854 m)   BMI Assessment: Estimated body mass index is 26.12 kg/m as calculated from the following:   Height as of this encounter: 6\' 1"  (1.854 m).   Weight as of this encounter: 198 lb (89.8 kg).  BMI interpretation table: BMI level Category Range association with higher incidence of chronic pain  <18 kg/m2 Underweight   18.5-24.9 kg/m2 Ideal body weight   25-29.9 kg/m2 Overweight Increased incidence by 20%  30-34.9 kg/m2 Obese (Class I) Increased incidence by 68%  35-39.9 kg/m2 Severe obesity (Class II) Increased incidence  by 136%  >40 kg/m2 Extreme obesity (Class III) Increased incidence by 254%   BMI Readings from Last 4 Encounters:  03/12/17 26.12 kg/m  03/06/17 26.68 kg/m  02/07/17 28.10 kg/m  02/04/17 28.10 kg/m   Wt Readings from Last 4 Encounters:  03/12/17 198 lb (89.8 kg)  03/06/17 202 lb 3.2 oz (91.7 kg)  02/07/17 213 lb (96.6 kg)  02/04/17 213 lb (96.6 kg)  Psych/Mental status: Alert, oriented x 3 (person, place, & time)       Eyes: PERLA Respiratory: No evidence of acute respiratory distress  Cervical Spine Exam  Inspection: No masses, redness, or swelling Alignment: Symmetrical Functional ROM: Unrestricted ROM Stability: No instability detected Muscle strength & Tone: Functionally intact Sensory: Unimpaired Palpation: No palpable anomalies  Upper Extremity (UE) Exam    Side: Right upper extremity  Side: Left upper extremity  Inspection: No masses, redness, swelling, or asymmetry. No contractures  Inspection: No masses, redness, swelling, or asymmetry. No contractures  Functional ROM: Unrestricted ROM          Functional ROM: Unrestricted ROM          Muscle strength & Tone: Functionally  intact  Muscle strength & Tone: Functionally intact  Sensory: Unimpaired  Sensory: Unimpaired  Palpation: No palpable anomalies  Palpation: No palpable anomalies  Specialized Test(s): Deferred         Specialized Test(s): Deferred          Thoracic Spine Exam  Inspection: No masses, redness, or swelling Alignment: Symmetrical Functional ROM: Unrestricted ROM Stability: No instability detected Sensory: Unimpaired Muscle strength & Tone: No palpable anomalies  Lumbar Spine Exam  Inspection: No masses, redness, or swelling Alignment: Symmetrical Functional ROM: Minimal ROM Stability: No instability detected Muscle strength & Tone: Increased muscle tone over affected area Sensory: Movement-associated pain Palpation: Complains of area being tender to palpation Provocative Tests: Lumbar Hyperextension and rotation test: Positive bilaterally for facet joint pain. Patrick's Maneuver: evaluation deferred today              Gait & Posture Assessment  Ambulation: Patient ambulates using a cane Gait: Very limited, using assistive device to ambulate Posture: Difficulty standing up straight, due to pain   Lower Extremity Exam    Side: Right lower extremity  Side: Left lower extremity  Inspection: No masses, redness, swelling, or asymmetry. No contractures  Inspection: No masses, redness, swelling, or asymmetry. No contractures  Functional ROM: Unrestricted ROM          Functional ROM: Unrestricted ROM          Muscle strength & Tone: Functionally intact  Muscle strength & Tone: Functionally intact  Sensory: Unimpaired  Sensory: Unimpaired  Palpation: No palpable anomalies  Palpation: No palpable anomalies   Assessment  Primary Diagnosis & Pertinent Problem List: The primary encounter diagnosis was Chronic low back pain (Location of Primary Source of Pain) (Left). Diagnoses of Chronic lower extremity pain (Location of Secondary source of pain) (Left), Chronic lumbar radicular pain (L5/S1  dermatome) (Location of Secondary source of pain) (Left), Lumbar facet arthropathy (Bilateral), Failed back surgical syndrome (x 3), Chronic pain syndrome, Musculoskeletal pain, Neurogenic pain, Long term prescription opiate use, Opiate use, Post herpetic neuralgia, and Abnormal MRI, lumbar spine (03/06/2017) were also pertinent to this visit.  Status Diagnosis  Controlled Controlled Controlled 1. Chronic low back pain (Location of Primary Source of Pain) (Left)   2. Chronic lower extremity pain (Location of Secondary source of pain) (Left)   3. Chronic lumbar  radicular pain (L5/S1 dermatome) (Location of Secondary source of pain) (Left)   4. Lumbar facet arthropathy (Bilateral)   5. Failed back surgical syndrome (x 3)   6. Chronic pain syndrome   7. Musculoskeletal pain   8. Neurogenic pain   9. Long term prescription opiate use   10. Opiate use   11. Post herpetic neuralgia   12. Abnormal MRI, lumbar spine (03/06/2017)      Plan of Care  Pharmacotherapy (Medications Ordered): Meds ordered this encounter  Medications  . traMADol (ULTRAM) 50 MG tablet    Sig: Take 2 tablets (100 mg total) by mouth every 6 (six) hours as needed.    Dispense:  240 tablet    Refill:  0    Do not place medication on "Automatic Refill". Fill one day early if pharmacy is closed on scheduled refill date.  . tizanidine (ZANAFLEX) 2 MG capsule    Sig: Take 1 capsule (2 mg total) by mouth 3 (three) times daily as needed for muscle spasms.    Dispense:  90 capsule    Refill:  0    Do not place this medication, or any other prescription from our practice, on "Automatic Refill". Patient may have prescription filled one day early if pharmacy is closed on scheduled refill date.  . gabapentin (NEURONTIN) 300 MG capsule    Sig: Take 1-3 capsules (300-900 mg total) by mouth every 8 (eight) hours. Follow titration schedule.    Dispense:  270 capsule    Refill:  0    Do not place this medication, or any other  prescription from our practice, on "Automatic Refill". Patient may have prescription filled one day early if pharmacy is closed on scheduled refill date.  . orphenadrine (NORFLEX) injection 60 mg  . ketorolac (TORADOL) injection 60 mg   New Prescriptions   No medications on file   Medications administered today: We administered orphenadrine and ketorolac. Lab-work, procedure(s), and/or referral(s): Orders Placed This Encounter  Procedures  . Lumbar Epidural Injection  . Lumbar Transforaminal Epidural  . ToxASSURE Select 13 (MW), Urine   Imaging and/or referral(s): None  Interventional therapies: Planned, scheduled, and/or pending:   Diagnostic Left L2-3 interlaminar lumbar epidural steroid injection + left L2-3 transforaminal epidural steroid injection under fluoroscopic guidance and IV sedation.   Considering:   Diagnostic left L5-S1 transforaminal epidural steroid injection.  Repeat diagnostic left sided caudal epidural steroid injection +epidurogram. Possible RACZepidurolysis of adhesions.   Palliative PRN treatment(s):   None at this time.    Provider-requested follow-up: Return in about 3 months (around 06/12/2017) for (MD) Med-Mgmt, in addition, procedure (ASAP).  Future Appointments Date Time Provider Department Center  06/04/2017 10:20 AM Crystal Cameron Ali, NP [facility] None   Primary Care Physician: Glori Luis, MD Location: [facility] Out[facility] Management Facility Note by: Sydnee Levans. Laban Emperor, M.D, DABA, DABAPM, DABPM, DABIPP, FIPP Date: 03/12/2017; Time: 4:35 PM  Pain Score Disclaimer: We use the NRS-11 scale. This is a self-reported, subjective measurement of pain severity with only modest accuracy. It is used primarily to identify changes within a particular patient. It must be understood that outpatient pain scales are significantly less accurate that those used for research, where they can be applied under ideal controlled circumstances with minimal  exposure to variables. In reality, the score is likely to be a combination of pain intensity and pain affect, where pain affect describes the degree of emotional arousal or changes in action readiness caused by the sensory experience of  pain. Factors such as social and work situation, setting, emotional state, anxiety levels, expectation, and prior pain experience may influence pain perception and show large inter-individual differences that may also be affected by time variables.  Patient instructions provided during this appointment: Patient Instructions   You have Gabapentin and Tizanidine at the pharmacy to be picked up. You have been given a script for Tramadol today with 0 refills.  You have been given pre procedure instructions.  Do not eat or drink for 8 hours.  Bring a driver, take blood pressure medication the morning of the procedure if applicable.      Pain Score  Introduction: The pain score used by this practice is the Verbal Numerical Rating Scale (VNRS-11). This is an 11-point scale. It is for adults and children 10 years or older. There are significant differences in how the pain score is reported, used, and applied. Forget everything you learned in the past and learn this scoring system.  General Information: The scale should reflect your current level of pain. Unless you are specifically asked for the level of your worst pain, or your average pain. If you are asked for one of these two, then it should be understood that it is over the past 24 hours.  Basic Activities of Daily Living (ADL): Personal hygiene, dressing, eating, transferring, and using restroom.  Instructions: Most patients tend to report their level of pain as a combination of two factors, their physical pain and their psychosocial pain. This last one is also known as "suffering" and it is reflection of how physical pain affects you socially and psychologically. From now on, report them separately. From this point  on, when asked to report your pain level, report only your physical pain. Use the following table for reference.  Pain Clinic Pain Levels (0-5/10)  Pain Level Score Description  No Pain 0   Mild pain 1 Nagging, annoying, but does not interfere with basic activities of daily living (ADL). Patients are able to eat, bathe, get dressed, toileting (being able to get on and off the toilet and perform personal hygiene functions), transfer (move in and out of bed or a chair without assistance), and maintain continence (able to control bladder and bowel functions). Blood pressure and heart rate are unaffected. A normal heart rate for a healthy adult ranges from 60 to 100 bpm (beats per minute).   Mild to moderate pain 2 Noticeable and distracting. Impossible to hide from other people. More frequent flare-ups. Still possible to adapt and function close to normal. It can be very annoying and may have occasional stronger flare-ups. With discipline, patients may get used to it and adapt.   Moderate pain 3 Interferes significantly with activities of daily living (ADL). It becomes difficult to feed, bathe, get dressed, get on and off the toilet or to perform personal hygiene functions. Difficult to get in and out of bed or a chair without assistance. Very distracting. With effort, it can be ignored when deeply involved in activities.   Moderately severe pain 4 Impossible to ignore for more than a few minutes. With effort, patients may still be able to manage work or participate in some social activities. Very difficult to concentrate. Signs of autonomic nervous system discharge are evident: dilated pupils (mydriasis); mild sweating (diaphoresis); sleep interference. Heart rate becomes elevated (>115 bpm). Diastolic blood pressure (lower number) rises above 100 mmHg. Patients find relief in laying down and not moving.   Severe pain 5 Intense and extremely unpleasant. Associated  with frowning face and frequent crying.  Pain overwhelms the senses.  Ability to do any activity or maintain social relationships becomes significantly limited. Conversation becomes difficult. Pacing back and forth is common, as getting into a comfortable position is nearly impossible. Pain wakes you up from deep sleep. Physical signs will be obvious: pupillary dilation; increased sweating; goosebumps; brisk reflexes; cold, clammy hands and feet; nausea, vomiting or dry heaves; loss of appetite; significant sleep disturbance with inability to fall asleep or to remain asleep. When persistent, significant weight loss is observed due to the complete loss of appetite and sleep deprivation.  Blood pressure and heart rate becomes significantly elevated. Caution: If elevated blood pressure triggers a pounding headache associated with blurred vision, then the patient should immediately seek attention at an urgent or emergency care unit, as these may be signs of an impending stroke.    Emergency Department Pain Levels (6-10/10)  Emergency Room Pain 6 Severely limiting. Requires emergency care and should not be seen or managed at an outpatient pain management facility. Communication becomes difficult and requires great effort. Assistance to reach the emergency department may be required. Facial flushing and profuse sweating along with potentially dangerous increases in heart rate and blood pressure will be evident.   Distressing pain 7 Self-care is very difficult. Assistance is required to transport, or use restroom. Assistance to reach the emergency department will be required. Tasks requiring coordination, such as bathing and getting dressed become very difficult.   Disabling pain 8 Self-care is no longer possible. At this level, pain is disabling. The individual is unable to do even the most "basic" activities such as walking, eating, bathing, dressing, transferring to a bed, or toileting. Fine motor skills are lost. It is difficult to think clearly.    Incapacitating pain 9 Pain becomes incapacitating. Thought processing is no longer possible. Difficult to remember your own name. Control of movement and coordination are lost.   The worst pain imaginable 10 At this level, most patients pass out from pain. When this level is reached, collapse of the autonomic nervous system occurs, leading to a sudden drop in blood pressure and heart rate. This in turn results in a temporary and dramatic drop in blood flow to the brain, leading to a loss of consciousness. Fainting is one of the body's self defense mechanisms. Passing out puts the brain in a calmed state and causes it to shut down for a while, in order to begin the healing process.    Summary: 1. Refer to this scale when providing [place] with your pain level. 2. Be accurate and careful when reporting your pain level. This will help with your care. 3. Over-reporting your pain level will lead to loss of credibility. 4. Even a level of 1/10 means that there is pain and will be treated at our facility. 5. High, inaccurate reporting will be documented as "Symptom Exaggeration", leading to loss of credibility and suspicions of possible secondary gains such as obtaining more narcotics, or wanting to appear disabled, for fraudulent reasons. 6. Only pain levels of 5 or below will be seen at our facility. 7. Pain levels of 6 and above will be sent to the Emergency Department and the appointment cancelled. _____________________________________________________________________________________________  Epidural Steroid Injection Patient Information  Description: The epidural space surrounds the nerves as they exit the spinal cord.  In some patients, the nerves can be compressed and inflamed by a bulging disc or a tight spinal canal (spinal stenosis).  By injecting steroids into  the epidural space, we can bring irritated nerves into direct contact with a potentially helpful medication.  These steroids act directly  on the irritated nerves and can reduce swelling and inflammation which often leads to decreased pain.  Epidural steroids may be injected anywhere along the spine and from the neck to the low back depending upon the location of your pain.   After numbing the skin with local anesthetic (like Novocaine), a small needle is passed into the epidural space slowly.  You may experience a sensation of pressure while this is being done.  The entire block usually last less than 10 minutes.  Conditions which may be treated by epidural steroids:   Low back and leg pain  Neck and arm pain  Spinal stenosis  Post-laminectomy syndrome  Herpes zoster (shingles) pain  Pain from compression fractures  Preparation for the injection:  1. Do not eat any solid food or dairy products within 8 hours of your appointment.  2. You may drink clear liquids up to 3 hours before appointment.  Clear liquids include water, black coffee, juice or soda.  No milk or cream please. 3. You may take your regular medication, including pain medications, with a sip of water before your appointment  Diabetics should hold regular insulin (if taken separately) and take 1/2 normal NPH dos the morning of the procedure.  Carry some sugar containing items with you to your appointment. 4. A driver must accompany you and be prepared to drive you home after your procedure.  5. Bring all your current medications with your. 6. An IV may be inserted and sedation may be given at the discretion of the physician.   7. A blood pressure cuff, EKG and other monitors will often be applied during the procedure.  Some patients may need to have extra oxygen administered for a short period. 8. You will be asked to provide medical information, including your allergies, prior to the procedure.  We must know immediately if you are taking blood thinners (like Coumadin/Warfarin)  Or if you are allergic to IV iodine contrast (dye). We must know if you could  possible be pregnant.  Possible side-effects:  Bleeding from needle site  Infection (rare, may require surgery)  Nerve injury (rare)  Numbness & tingling (temporary)  Difficulty urinating (rare, temporary)  Spinal headache ( a headache worse with upright posture)  Light -headedness (temporary)  Pain at injection site (several days)  Decreased blood pressure (temporary)  Weakness in arm/leg (temporary)  Pressure sensation in back/neck (temporary)  Call if you experience:  Fever/chills associated with headache or increased back/neck pain.  Headache worsened by an upright position.  New onset weakness or numbness of an extremity below the injection site  Hives or difficulty breathing (go to the emergency room)  Inflammation or drainage at the infection site  Severe back/neck pain  Any new symptoms which are concerning to you  Please note:  Although the local anesthetic injected can often make your back or neck feel good for several hours after the injection, the pain will likely return.  It takes 3-7 days for steroids to work in the epidural space.  You may not notice any pain relief for at least that one week.  If effective, we will often do a series of three injections spaced 3-6 weeks apart to maximally decrease your pain.  After the initial series, we generally will wait several months before considering a repeat injection of the same type.  If you have any  questions, please call [phone] [facility] Clinic[facility] Patient Information  Description: Specific nerve roots exit the spinal canal and these nerves can be compressed and inflamed by a bulging disc and bone spurs.  By injecting steroids on the nerve root, we can potentially decrease the inflammation surrounding these nerves, which often leads to decreased pain.  Also, by injecting local anesthesia on the nerve root, this can provide [place] helpful information to  give to your referring doctor if it decreases your pain.  Selective nerve root blocks can be done along the spine from the neck to the low back depending on the location of your pain.   After numbing the skin with local anesthesia, a small needle is passed to the nerve root and the position of the needle is verified using x-ray pictures.  After the needle is in correct position, we then deposit the medication.  You may experience a pressure sensation while this is being done.  The entire block usually lasts less than 15 minutes.  Conditions that may be treated with selective nerve root blocks:  Low back and leg pain  Spinal stenosis  Diagnostic block prior to potential surgery  Neck and arm pain  Post laminectomy syndrome  Preparation for the injection:  9. Do not eat any solid food or dairy products within 8 hours of your appointment. 10. You may drink clear liquids up to 3 hours before an appointment.  Clear liquids include water, black coffee, juice or soda.  No milk or cream please. 11. You may take your regular medications, including pain medications, with a sip of water before your appointment.  Diabetics should hold regular insulin (if taken separately) and take 1/2 normal NPH dose the morning of the procedure.  Carry some sugar containing items with you to your appointment. 12. A driver must accompany you and be prepared to drive you home after your procedure. 13. Bring all your current medications with you. 14. An IV may be inserted and sedation may be given at the discretion of the physician. 15. A blood pressure cuff, EKG, and other monitors will often be applied during the procedure.  Some patients may need to have extra oxygen administered for a short period. 16. You will be asked to provide medical information, including allergies, prior to the procedure.  We must know immediately if you are taking blood  Thinners (like Coumadin) or if you are allergic to IV iodine contrast  (dye).  Possible side-effects: All are usually temporary  Bleeding from needle site  Light headedness  Numbness and tingling  Decreased blood pressure  Weakness in arms/legs  Pressure sensation in back/neck  Pain at injection site (several days)  Possible complications: All are extremely rare  Infection  Nerve injury  Spinal headache (a headache wore with upright position)  Call if you experience:  Fever/chills associated with headache or increased back/neck pain  Headache worsened by an upright position  New onset weakness or numbness of an extremity below the injection site  Hives or difficulty breathing (go to the emergency room)  Inflammation or drainage at the injection site(s)  Severe back/neck pain greater than usual  New symptoms which are concerning to you  Please note:  Although the local anesthetic injected can often make your back or neck feel good for several hours after the injection the pain will likely return.  It takes 3-5 days for steroids to work on the nerve root. You may not notice  any pain relief for at least one week.  If effective, we will often do a series of 3 injections spaced 3-6 weeks apart to maximally decrease your pain.    If you have any questions, please call [phone] [facility]  Gabapentin Titration  Medication used: Gabapentin (Generic Name) or Neurontin (Brand Name) 300 mg tablets/capsules  Reasons to stop increasing the dose:  Reason 1: You get good relief of symptoms, in which case there is no need to increase the daily dose any further.    Reason 2: You develop some side effects, such as sleeping all of the time, difficulty concentrating, or becoming disoriented, in which case you need to go down on the dose, to the prior level, where you were not experiencing any side effects. Stay on that dose longer, to allow more time for your body to get use it, before attempting to  increase it again.   Reasons to stop increasing the dose: Reason 1: You get good relief of symptoms, in which case there is no need to increase the daily dose any further.  Reason 2: You develop some side effects, such as sleeping all of the time, difficulty concentrating, or becoming disoriented, in which case you need to go down on the dose, to the prior level, where you were not experiencing any side effects. Stay on that dose longer, to allow more time for your body to get use it, before attempting to increase it again.  Steps to increase medication: Step 1: Start by taking 1 (one) tablet at bedtime x 7 (seven) days.  Step 2: After 7 (seven) days of taking 1 (one) tablet at bedtime, increase it to 2 (two) tablets at bedtime. Stay on this dose x 7 (seven) days.  Step 3: After 7 (seven) days of taking 2 (two) tablets at bedtime, increase it to 3 (three) tablets at bedtime. Stay on this dose x another 7 (seven) days.  Step 4: After 7 (seven) days of taking 3 (three) tablet at bedtime, begin taking 1 (one) tablet at noon with lunch. Stay on this dose x another 7 (seven) days.  Step 5: After 7 (seven) days of taking 3 (three) tablet at bedtime, and 1 (one) tablet at noon, then begin taking 1 (one) tablet in the afternoon with dinner. Stay on this dose x another 7 (seven) days.  Step 6: After 7 (seven) days of taking 3 (three) tablet at bedtime, 1 (one) tablet at noon, and 1 (one) tablet in the afternoon, then begin taking 1 (one) tablet in the morning with breakfast. Stay on this dose x another 7 (seven) days. At this point you should be taking the medicine 4 (four) times a day, or about every 6 (six) hours. This daily regimen of taking the medicine 4 (four) times a day, will be maintained from now on. You should not take any doses any sooner than every 6 (six) hours.  Step 7: After 7 (seven) days of taking 3 (three) tablet at bedtime, 1 (one) tablet at noon, 1 (one) tablet in the afternoon, and 1  (one) tablet in the morning, begin taking 2 (two) tablets at noon with lunch. Stay on this dose x another 7 (seven) days.   Step 8: After 7 (seven) days of taking 3 (three) tablet at bedtime, 2 (two) tablets at noon, 1 (one) tablet in the afternoon, and 1 (one) tablet in the morning, begin taking 2 (two) tablets in the afternoon with dinner. Stay on  this dose x another 7 (seven) days.   Step 9: After 7 (seven) days of taking 3 (three) tablet at bedtime, 2 (two) tablets at noon, 2 (two) tablets in the afternoon, and 1 (one) tablet in the morning, begin taking 2 (two) tablets in the morning with breakfast. Stay on this dose x another 7 (seven) days. At this point you should be taking the medicine 4 (four) times a day, or about every 6 (six) hours. This daily regimen of taking the medicine 4 (four) times a day, will be maintained from now on. You should not take any doses any sooner than every 6 (six) hours.  Step 10: After 7 (seven) days of taking 3 (three) tablet at bedtime, 2 (two) tablets at noon, 2 (two) tablets in the afternoon, and 2 (two) tablets in the morning, begin taking 3 (three) tablets at noon with lunch. Stay on this dose x another 7 (seven) days.   Step 11: After 7 (seven) days of taking 3 (three) tablet at bedtime, 3 (three) tablets at noon, 2 (two) tablets in the afternoon, and 2 (two) tablets in the morning, begin taking 3 (three) tablets in the afternoon with dinner. Stay on this dose x another 7 (seven) days.   Step 12: After 7 (seven) days of taking 3 (three) tablet at bedtime, 3 (three) tablets at noon, 3 (three) tablets in the afternoon, and 2 (two) tablet in the morning, begin taking 3 (three) tablets in the morning with breakfast. Stay on this dose x another 7 (seven) days. At this point you should be taking the medicine 4 (four) times a day, or about every 6 (six) hours. This daily regimen of taking the medicine 4 (four) times a day, will be maintained from now on.   Endpoint:  Once you have reached the maximum dose you can tolerate without side-effects, contact your physician so as to evaluate the results of the regimen.   Questions: Feel free to contact us for any questions or problems at [phone]  Preparing for Procedure with Sedation Instructions: . Oral Intake: Do not eat or drink anything for at least 8 hours prior to your procedure. . Transportation: Public transportation is not allowed. Bring an adult driver. The driver must be physically present in our waiting room before any procedure can be started. [place] Kitchen Physical Assistance: Bring an adult physically capable of assisting you, in the event you need help. This adult should keep you company at home for at least 6 hours after the procedure. . Blood Pressure Medicine: Take your blood pressure medicine with a sip of water the morning of the procedure. . Blood thinners:  . Diabetics on insulin: Notify the staff so that you can be scheduled 1st case in the morning. If your diabetes requires high dose insulin, take only  of your normal insulin dose the morning of the procedure and notify the staff that you have done so. . Preventing infections: Shower with an antibacterial soap the morning of your procedure. . Build-up your immune system: Take 1000 mg of Vitamin C with every meal (3 times a day) the day prior to your procedure. Marland Kitchen Antibiotics: Inform the staff if you have a condition or reason that requires you to take antibiotics before dental procedures. . Pregnancy: If you are pregnant, call and cancel the procedure. . Sickness: If you have a cold, fever, or any active infections, call and cancel the procedure. . Arrival: You must be in the facility at least 30 minutes prior  to your scheduled procedure. . Children: Do not bring children with you. . Dress appropriately: Bring dark clothing that you would not mind if they get stained. . Valuables: Do not bring any jewelry or valuables. Procedure appointments are  reserved for interventional treatments only. Marland Kitchen No Prescription Refills. . No medication changes will be discussed during procedure appointments. . No disability issues will be discussed.  ____________________________________________________________________________________________

## 2017-03-15 LAB — TOXASSURE SELECT 13 (MW), URINE

## 2017-03-17 ENCOUNTER — Other Ambulatory Visit: Payer: Self-pay | Admitting: Family Medicine

## 2017-03-20 ENCOUNTER — Ambulatory Visit
Admission: RE | Admit: 2017-03-20 | Discharge: 2017-03-20 | Disposition: A | Discharge status: Home / Self Care | Payer: Medicare HMO | Source: Ambulatory Visit | Attending: Pain Medicine | Admitting: Pain Medicine

## 2017-03-20 ENCOUNTER — Ambulatory Visit (HOSPITAL_BASED_OUTPATIENT_CLINIC_OR_DEPARTMENT_OTHER): Payer: Medicare HMO | Admitting: Pain Medicine

## 2017-03-20 ENCOUNTER — Encounter: Payer: Self-pay | Admitting: Pain Medicine

## 2017-03-20 VITALS — BP 161/93 | HR 71 | Temp 97.7°F | Resp 9 | Ht 72.0 in | Wt 202.0 lb

## 2017-03-20 DIAGNOSIS — M48061 Spinal stenosis, lumbar region without neurogenic claudication: Secondary | ICD-10-CM | POA: Insufficient documentation

## 2017-03-20 DIAGNOSIS — Z981 Arthrodesis status: Secondary | ICD-10-CM | POA: Insufficient documentation

## 2017-03-20 DIAGNOSIS — Z888 Allergy status to other drugs, medicaments and biological substances status: Secondary | ICD-10-CM | POA: Insufficient documentation

## 2017-03-20 DIAGNOSIS — M5416 Radiculopathy, lumbar region: Secondary | ICD-10-CM | POA: Insufficient documentation

## 2017-03-20 DIAGNOSIS — G8929 Other chronic pain: Secondary | ICD-10-CM | POA: Diagnosis not present

## 2017-03-20 DIAGNOSIS — Z885 Allergy status to narcotic agent status: Secondary | ICD-10-CM | POA: Insufficient documentation

## 2017-03-20 DIAGNOSIS — Z90710 Acquired absence of both cervix and uterus: Secondary | ICD-10-CM | POA: Insufficient documentation

## 2017-03-20 DIAGNOSIS — M79605 Pain in left leg: Secondary | ICD-10-CM

## 2017-03-20 DIAGNOSIS — M9983 Other biomechanical lesions of lumbar region: Secondary | ICD-10-CM

## 2017-03-20 DIAGNOSIS — Z9049 Acquired absence of other specified parts of digestive tract: Secondary | ICD-10-CM | POA: Insufficient documentation

## 2017-03-20 DIAGNOSIS — M5442 Lumbago with sciatica, left side: Secondary | ICD-10-CM

## 2017-03-20 DIAGNOSIS — B0229 Other postherpetic nervous system involvement: Secondary | ICD-10-CM | POA: Insufficient documentation

## 2017-03-20 DIAGNOSIS — Z9889 Other specified postprocedural states: Secondary | ICD-10-CM | POA: Insufficient documentation

## 2017-03-20 MED ORDER — FENTANYL CITRATE (PF) 100 MCG/2ML IJ SOLN
25.0000 ug | INTRAMUSCULAR | Status: DC | PRN
  Administered 2017-03-20: 50 ug via INTRAVENOUS
  Filled 2017-03-20: qty 2

## 2017-03-20 MED ORDER — IOPAMIDOL (ISOVUE-M 200) INJECTION 41%
10.0000 mL | Freq: Once | INTRAMUSCULAR | Status: AC
  Administered 2017-03-20: 10 mL via EPIDURAL
  Filled 2017-03-20: qty 10

## 2017-03-20 MED ORDER — SODIUM CHLORIDE 0.9% FLUSH
2.0000 mL | Freq: Once | INTRAVENOUS | Status: AC
  Administered 2017-03-20 (×2): 2 mL

## 2017-03-20 MED ORDER — SODIUM CHLORIDE 0.9% FLUSH: 2.0000 mL | Freq: Once | INTRAVENOUS | Status: DC

## 2017-03-20 MED ORDER — MIDAZOLAM HCL 5 MG/5ML IJ SOLN
1.0000 mg | INTRAMUSCULAR | Status: DC | PRN
  Administered 2017-03-20: 2 mg via INTRAVENOUS
  Filled 2017-03-20: qty 5

## 2017-03-20 MED ORDER — LACTATED RINGERS IV SOLN: 1000.0000 mL | Freq: Once | INTRAVENOUS | Status: DC

## 2017-03-20 MED ORDER — LIDOCAINE HCL (PF) 1 % IJ SOLN
10.0000 mL | Freq: Once | INTRAMUSCULAR | Status: AC
  Administered 2017-03-20: 5 mL
  Filled 2017-03-20: qty 10

## 2017-03-20 MED ORDER — SODIUM CHLORIDE 0.9 % IJ SOLN
INTRAMUSCULAR | Status: AC
  Filled 2017-03-20: qty 20

## 2017-03-20 MED ORDER — ROPIVACAINE HCL 2 MG/ML IJ SOLN
1.0000 mL | Freq: Once | INTRAMUSCULAR | Status: AC
  Administered 2017-03-20: 1 mL via EPIDURAL

## 2017-03-20 MED ORDER — ROPIVACAINE HCL 2 MG/ML IJ SOLN
2.0000 mL | Freq: Once | INTRAMUSCULAR | Status: DC
  Filled 2017-03-20: qty 10

## 2017-03-20 MED ORDER — DEXAMETHASONE SODIUM PHOSPHATE 4 MG/ML IJ SOLN
10.0000 mg | Freq: Once | INTRAMUSCULAR | Status: AC
  Administered 2017-03-20: 4 mg
  Filled 2017-03-20: qty 3

## 2017-03-20 MED ORDER — TRIAMCINOLONE ACETONIDE 40 MG/ML IJ SUSP
40.0000 mg | Freq: Once | INTRAMUSCULAR | Status: AC
  Administered 2017-03-20: 40 mg
  Filled 2017-03-20: qty 1

## 2017-03-20 NOTE — Patient Instructions (Addendum)

## 2017-03-20 NOTE — Progress Notes (Signed)
Patient's Name: Maria Hamilton  MRN: [id number]  Referring Provider: Delano Metz, MD  DOB: 1951/03/10  PCP: Glori Luis, MD  DOS: 03/20/2017  Note by: Sydnee Levans. Laban Emperor, MD  Service setting: Ambulatory outpatient  Location: [facility] (AMB) Pain Management Facility  Visit type: Procedure  Specialty: Interventional Pain Management  Patient type: Established   Primary Reason for Visit: Interventional Pain Management Treatment. CC: Back Pain (lower)  Procedure:  Anesthesia, Analgesia, Anxiolysis:  Procedure #1: Type: Diagnostic Inter-Laminar Epidural Steroid Injection Region: Lumbar Level: L2-3 Level. Laterality: Left Paramedial  Procedure #2: Type: Diagnostic Trans-Foraminal Epidural Steroid Injection Region: Lumbar Level: L2-3 Paravertebral Laterality: Left Paravertebral Position: Prone  Type: Local Anesthesia with Moderate (Conscious) Sedation Local Anesthetic: Lidocaine 1% Route: Intravenous (IV) IV Access: Secured Sedation: Meaningful verbal contact was maintained at all times during the procedure  Indication(s): Analgesia and Anxiety  Indications: 1. Chronic lumbar radicular pain (L5/S1 dermatome) (Location of Secondary source of pain) (Left)   2. Chronic lower extremity pain (Location of Secondary source of pain) (Left)   3. 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)   4. Chronic low back pain (Location of Primary Source of Pain) (Left)   5. Post herpetic neuralgia   6. History of lumbar fusion (L3-4, L4-5, and L5-S1)    Pain Score: Pre-procedure: 8 /10 Post-procedure: 0-No pain/10  Pre-op Assessment:  Previous date of service: 03/12/17 Service provided: Evaluation Maria Hamilton is a 66 y.o. (year old), female patient, seen today for interventional treatment. She  has a past surgical history that includes Breast surgery; Back surgery; Ectopic pregnancy surgery; Hip surgery; Abdominal hysterectomy; cyst removal from wrist; and Cholecystectomy (N/A,  10/16/2016). Her primarily concern today is the Back Pain (lower)  Initial Vital Signs: Blood pressure (!) 164/84, pulse 81, temperature 98.5 F (36.9 C), temperature source Oral, resp. rate (!) 1, height 6' (1.829 m), weight 202 lb (91.6 kg), SpO2 100 %. BMI: 27.40 kg/m  Risk Assessment: Allergies: Reviewed. She is allergic to ace inhibitors; ciprofloxacin; fentanyl; morphine and related; penicillin g benzathine; and tape.  Allergy Precautions: None required Coagulopathies: "Reviewed. None identified.  Blood-thinner therapy: None at this time Active Infection(s): Reviewed. None identified. [name] is afebrile  Site Confirmation: [name] was asked to confirm the procedure and laterality before marking the site Procedure checklist: Completed Consent: Before the procedure and under the influence of no sedative(s), amnesic(s), or anxiolytics, the patient was informed of the treatment options, risks and possible complications. To fulfill our ethical and legal obligations, as recommended by the American Medical Association's Code of Ethics, I have informed the patient of my clinical impression; the nature and purpose of the treatment or procedure; the risks, benefits, and possible complications of the intervention; the alternatives, including doing nothing; the risk(s) and benefit(s) of the alternative treatment(s) or procedure(s); and the risk(s) and benefit(s) of doing nothing. The patient was provided information about the general risks and possible complications associated with the procedure. These may include, but are not limited to: failure to achieve desired goals, infection, bleeding, organ or nerve damage, allergic reactions, paralysis, and death. In addition, the patient was informed of those risks and complications associated to Spine-related procedures, such as failure to decrease pain; infection (i.e.: Meningitis, epidural or intraspinal abscess); bleeding (i.e.: epidural  hematoma, subarachnoid hemorrhage, or any other type of intraspinal or peri-dural bleeding); organ or nerve damage (i.e.: Any type of peripheral nerve, nerve root, or spinal cord injury) with subsequent damage to sensory, motor, and/or autonomic  systems, resulting in permanent pain, numbness, and/or weakness of one or several areas of the body; allergic reactions; (i.e.: anaphylactic reaction); and/or death. Furthermore, the patient was informed of those risks and complications associated with the medications. These include, but are not limited to: allergic reactions (i.e.: anaphylactic or anaphylactoid reaction(s)); adrenal axis suppression; blood sugar elevation that in diabetics may result in ketoacidosis or comma; water retention that in patients with history of congestive heart failure may result in shortness of breath, pulmonary edema, and decompensation with resultant heart failure; weight gain; swelling or edema; medication-induced neural toxicity; particulate matter embolism and blood vessel occlusion with resultant organ, and/or nervous system infarction; and/or aseptic necrosis of one or more joints. Finally, the patient was informed that Medicine is not an exact science; therefore, there is also the possibility of unforeseen or unpredictable risks and/or possible complications that may result in a catastrophic outcome. The patient indicated having understood very clearly. We have given the patient no guarantees and we have made no promises. Enough time was given to the patient to ask questions, all of which were answered to the patient's satisfaction. Maria Hamilton has indicated that she wanted to continue with the procedure. Attestation: I, the ordering provider, attest that I have discussed with the patient the benefits, risks, side-effects, alternatives, likelihood of achieving goals, and potential problems during recovery for the procedure that I have provided informed consent. Date: 03/20/2017;  Time: 8:12 AM  Pre-Procedure Preparation:  Monitoring: As per clinic protocol. Respiration, ETCO2, SpO2, BP, heart rate and rhythm monitor placed and checked for adequate function Safety Precautions: Patient was assessed for positional comfort and pressure points before starting the procedure. Time-out: I initiated and conducted the "Time-out" before starting the procedure, as per protocol. The patient was asked to participate by confirming the accuracy of the "Time Out" information. Verification of the correct person, site, and procedure were performed and confirmed by me, the nursing staff, and the patient. "Time-out" conducted as per Joint Commission's Universal Protocol (UP.01.01.01). "Time-out" Date & Time: 03/20/2017; 0845 hrs.  Description of Procedure #1 Process:   Target Area: The  interlaminar space, initially targeting the lower border of the superior vertebral body lamina. Approach: Posterior paramedial approach. Area Prepped: Entire Posterior Lumbosacral Region Prepping solution: ChloraPrep (2% chlorhexidine gluconate and 70% isopropyl alcohol) Safety Precautions: Aspiration looking for blood return was conducted prior to all injections. At no point did we inject any substances, as a needle was being advanced. No attempts were made at seeking any paresthesias. Safe injection practices and needle disposal techniques used. Medications properly checked for expiration dates. SDV (single dose vial) medications used. Description of the Procedure: Protocol guidelines were followed. The patient was placed in position over the fluoroscopy table. The target area was identified and the area prepped in the usual manner. Skin desensitized using vapocoolant spray. Skin & deeper tissues infiltrated with local anesthetic. Appropriate amount of time allowed to pass for local anesthetics to take effect. The procedure needle was introduced through the skin, ipsilateral to the reported pain, and advanced to the  target area. Bone was contacted and the needle walked caudad, until the lamina was cleared. The ligamentum flavum was engaged and loss-of-resistance technique used as the epidural needle was advanced. The epidural space was identified using "loss-of-resistance technique" with 2-3 ml of PF-NaCl (0.9% NSS), in a 5cc LOR glass syringe. Proper needle placement secured. Negative aspiration confirmed. Solution injected in intermittent fashion, asking for systemic symptoms every 0.5cc of injectate. The needles were  then removed and the area cleansed, making sure to leave some of the prepping solution back to take advantage of its long term bactericidal properties. Start Time: 0845 hrs. Materials:  Needle(s) Type: Epidural needle Gauge: 17G Length: 3.5-in Medication(s): We administered fentaNYL, midazolam, dexamethasone, iopamidol, lidocaine (PF), sodium chloride flush, triamcinolone acetonide, lidocaine (PF), and ropivacaine (PF) 2 mg/mL (0.2%). Please see chart orders for dosing details.  Description of Procedure #2 Process:   Target Area: The inferior and lateral portion of the pedicle, just lateral to a line created by the 6:00 position of the pedicle and the superior articular process of the vertebral body below. On the lateral view, this target lies just posterior to the anterior aspect of the lamina and posterior to the midpoint created between the anterior and the posterior aspect of the neural foramina. Approach: Posterior paravertebral approach. Area Prepped: Same as above Prepping solution: Same as above Safety Precautions: Same as above Description of the Procedure: Protocol guidelines were followed. The patient was placed in position over the fluoroscopy table. The target area was identified and the area prepped in the usual manner. Skin desensitized using vapocoolant spray. Skin & deeper tissues infiltrated with local anesthetic. Appropriate amount of time allowed to pass for local anesthetics to  take effect. The procedure needles were then advanced to the target area. Proper needle placement secured. Negative aspiration confirmed. Solution injected in intermittent fashion, asking for systemic symptoms every 0.2cc of injectate. The needles were then removed and the area cleansed, making sure to leave some of the prepping solution back to take advantage of its long term bactericidal properties. Vitals:   03/20/17 0903 03/20/17 0909 03/20/17 0918 03/20/17 0928  BP: (!) 160/74 (!) 172/98 (!) 173/102 (!) 161/93  Pulse: 70 71 68 71  Resp: 11 10 10  (!) 9  Temp:  97.7 F (36.5 C)    TempSrc:  Temporal    SpO2:  98% 100% 99%  Weight:      Height:        End Time: 0902 hrs. Materials:  Needle(s) Type: Regular needle Gauge: 22G Length: 3.5-in Medication(s): We administered fentaNYL, midazolam, dexamethasone, iopamidol, lidocaine (PF), sodium chloride flush, triamcinolone acetonide, lidocaine (PF), and ropivacaine (PF) 2 mg/mL (0.2%). Please see chart orders for dosing details.  Imaging Guidance (Spinal):  Type of Imaging Technique: Fluoroscopy Guidance (Spinal) Indication(s): Assistance in needle guidance and placement for procedures requiring needle placement in or near specific anatomical locations not easily accessible without such assistance. Exposure Time: Please see nurses notes. Contrast: Before injecting any contrast, we confirmed that the patient did not have an allergy to iodine, shellfish, or radiological contrast. Once satisfactory needle placement was completed at the desired level, radiological contrast was injected. Contrast injected under live fluoroscopy. No contrast complications. See chart for type and volume of contrast used. Fluoroscopic Guidance: I was personally present during the use of fluoroscopy. "Tunnel Vision Technique" used to obtain the best possible view of the target area. Parallax error corrected before commencing the procedure. "Direction-depth-direction"  technique used to introduce the needle under continuous pulsed fluoroscopy. Once target was reached, antero-posterior, oblique, and lateral fluoroscopic projection used confirm needle placement in all planes. Images permanently stored in EMR. Interpretation: I personally interpreted the imaging intraoperatively. Adequate needle placement confirmed in multiple planes. Appropriate spread of contrast into desired area was observed. No evidence of afferent or efferent intravascular uptake. No intrathecal or subarachnoid spread observed. Permanent images saved into the patient's record.  Antibiotic Prophylaxis:  Indication(s): None  identified Antibiotic given: None  Post-operative Assessment:  EBL: None Complications: No immediate post-treatment complications observed by team, or reported by patient. Note: The patient tolerated the entire procedure well. A repeat set of vitals were taken after the procedure and the patient was kept under observation following institutional policy, for this type of procedure. Post-procedural neurological assessment was performed, showing return to baseline, prior to discharge. The patient was provided with post-procedure discharge instructions, including a section on how to identify potential problems. Should any problems arise concerning this procedure, the patient was given instructions to immediately contact us, at any time, without hesitation. In any case, we plan to contact the patient by telephone for a follow-up status report regarding this interventional procedure. Comments:  No additional relevant information.  Plan of Care  Disposition: Discharge home  Discharge Date & Time: 03/20/2017; 0936 hrs.  Physician-requested Follow-up:  Return in about 2 weeks (around 04/03/2017) for Post-Procedure evaluation.  Future Appointments Date Time Provider Department Center  04/17/2017 1:00 PM Delano Metz, MD [facility] None  06/04/2017 10:20 AM Aggie Cosier Cameron Ali, NP  [facility] None   Medications ordered for procedure: Meds ordered this encounter  Medications  . DISCONTD: fentaNYL (SUBLIMAZE) injection 25-50 mcg    Make sure Narcan is available in the pyxis when using this medication. In the event of respiratory depression (RR< 8/min): Titrate NARCAN (naloxone) in increments of 0.1 to 0.2 mg IV at 2-3 minute intervals, until desired degree of reversal.  . lactated ringers infusion 1,000 mL  . midazolam (VERSED) 5 MG/5ML injection 1-2 mg    Make sure Flumazenil is available in the pyxis when using this medication. If oversedation occurs, administer 0.2 mg IV over 15 sec. If after 45 sec no response, administer 0.2 mg again over 1 min; may repeat at 1 min intervals; not to exceed 4 doses (1 mg)  . dexamethasone (DECADRON) injection 10 mg  . iopamidol (ISOVUE-M) 41 % intrathecal injection 10 mL  . lidocaine (PF) (XYLOCAINE) 1 % injection 10 mL  . sodium chloride flush (NS) 0.9 % injection 2 mL  . triamcinolone acetonide (KENALOG-40) injection 40 mg  . lidocaine (PF) (XYLOCAINE) 1 % injection 10 mL  . sodium chloride flush (NS) 0.9 % injection 2 mL  . ropivacaine (PF) 2 mg/mL (0.2%) (NAROPIN) injection 2 mL  . ropivacaine (PF) 2 mg/mL (0.2%) (NAROPIN) injection 1 mL   Medications administered: We administered fentaNYL, midazolam, dexamethasone, iopamidol, lidocaine (PF), sodium chloride flush, triamcinolone acetonide, lidocaine (PF), and ropivacaine (PF) 2 mg/mL (0.2%).  See the medical record for exact dosing, route, and time of administration.  Lab-work, Procedure(s), & Referral(s) Ordered: Orders Placed This Encounter  Procedures  . Lumbar Epidural Injection  . DG C-Arm 1-60 Min-No Report  . Discharge instructions  . Follow-up  . Informed Consent Details: Transcribe to consent form and obtain patient signature  . Provider attestation of informed consent for procedure/surgical case  . Verify informed consent   Imaging Ordered: Results for orders  placed in visit on 02/04/17  DG C-Arm 1-60 Min-No Report   Narrative Fluoroscopy was utilized by the requesting physician.  No radiographic  interpretation.    New Prescriptions   No medications on file   Primary Care Physician: Glori Luis, MD Location: [facility] Note by: Sydnee Levans. Laban Emperor, M.D, DABA, DABAPM, DABPM, DABIPP, FIPP Date: 03/20/2017; Time: 4:20 PM  Disclaimer:  Medicine is not an Visual merchandiser. The only guarantee in medicine is that nothing is guaranteed.  It is important to note that the decision to proceed with this intervention was based on the information collected from the patient. The Data and conclusions were drawn from the patient's questionnaire, the interview, and the physical examination. Because the information was provided in large part by the patient, it cannot be guaranteed that it has not been purposely or unconsciously manipulated. Every effort has been made to obtain as much relevant data as possible for this evaluation. It is important to note that the conclusions that lead to this procedure are derived in large part from the available data. Always take into account that the treatment will also be dependent on availability of resources and existing treatment guidelines, considered by other Pain Management Practitioners as being common knowledge and practice, at the time of the intervention. For Medico-Legal purposes, it is also important to point out that variation in procedural techniques and pharmacological choices are the acceptable norm. The indications, contraindications, technique, and results of the above procedure should only be interpreted and judged by a Board-Certified Interventional Pain Specialist with extensive familiarity and expertise in the same exact procedure and technique. Attempts at providing opinions without similar or greater experience and expertise than that of the treating physician will be considered as  inappropriate and unethical, and shall result in a formal complaint to the state medical board and applicable specialty societies.  Instructions provided at this appointment: Patient Instructions  Post-Procedure instructions Instructions:  Apply ice: Fill a plastic sandwich bag with crushed ice. Cover it with a small towel and apply to injection site. Apply for 15 minutes then remove x 15 minutes. Repeat sequence on day of procedure, until you go to bed. The purpose is to minimize swelling and discomfort after procedure.  Apply heat: Apply heat to procedure site starting the day following the procedure. The purpose is to treat any soreness and discomfort from the procedure.  Food intake: Start with clear liquids (like water) and advance to regular food, as tolerated.   Physical activities: Keep activities to a minimum for the first 8 hours after the procedure.   Driving: If you have received any sedation, you are not allowed to drive for 24 hours after your procedure.  Blood thinner: Restart your blood thinner 6 hours after your procedure. (Only for those taking blood thinners)  Insulin: As soon as you can eat, you may resume your normal dosing schedule. (Only for those taking insulin)  Infection prevention: Keep procedure site clean and dry.  Post-procedure Pain Diary: Extremely important that this be done correctly and accurately. Recorded information will be used to determine the next step in treatment.  Pain evaluated is that of treated area only. Do not include pain from an untreated area.  Complete every hour, on the hour, for the initial 8 hours. Set an alarm to help you do this part accurately.  Do not go to sleep and have it completed later. It will not be accurate.  Follow-up appointment: Keep your follow-up appointment after the procedure. Usually 2 weeks for most procedures. (6 weeks in the case of radiofrequency.) Bring you pain diary.  Expect:  From numbing medicine  (AKA: Local Anesthetics): Numbness or decrease in pain.  Onset: Full effect within 15 minutes of injected.  Duration: It will depend on the type of local anesthetic used. On the average, 1 to 8 hours.   From steroids: Decrease in swelling or inflammation. Once inflammation is improved, relief of the pain will follow.  Onset of benefits: Depends on  the amount of swelling present. The more swelling, the longer it will take for the benefits to be seen.   Duration: Steroids will stay in the system x 2 weeks. Duration of benefits will depend on multiple posibilities including persistent irritating factors.  From procedure: Some discomfort is to be expected once the numbing medicine wears off. This should be minimal if ice and heat are applied as instructed. Call if:  You experience numbness and weakness that gets worse with time, as opposed to wearing off.  New onset bowel or bladder incontinence. (Spinal procedures only)  Emergency Numbers:  Durning business hours (Monday - Thursday, 8:00 AM - 4:00 PM) (Friday, 9:00 AM - 12:00 Noon): [phone]  After hours: [phone]   __________________________________________________________________________________________   Pain Management Discharge Instructions  General Discharge Instructions :  If you need to reach your doctor call: Monday-Friday 8:00 am - 4:00 pm at [phone] or toll free [phone].  After clinic hours [phone] to have operator reach doctor.  Bring all of your medication bottles to all your appointments in the pain clinic.  To cancel or reschedule your appointment with Pain Management please remember to call 24 hours in advance to avoid a fee.  Refer to the educational materials which you have been given on: General Risks, I had my Procedure. Discharge Instructions, Post Sedation.  Post Procedure Instructions:  The drugs you were given will stay in your system until tomorrow, so for the next 24 hours  you should not drive, make any legal decisions or drink any alcoholic beverages.  You may eat anything you prefer, but it is better to start with liquids then soups and crackers, and gradually work up to solid foods.  Please notify your doctor immediately if you have any unusual bleeding, trouble breathing or pain that is not related to your normal pain.  Depending on the type of procedure that was done, some parts of your body may feel week and/or numb.  This usually clears up by tonight or the next day.  Walk with the use of an assistive device or accompanied by an adult for the 24 hours.  You may use ice on the affected area for the first 24 hours.  Put ice in a Ziploc bag and cover with a towel and place against area 15 minutes on 15 minutes off.  You may switch to heat after 24 hours.

## 2017-03-20 NOTE — Progress Notes (Signed)
Safety precautions to be maintained throughout the outpatient stay will include: orient to surroundings, keep bed in low position, maintain call bell within reach at all times, provide assistance with transfer out of bed and ambulation.  

## 2017-03-21 ENCOUNTER — Telehealth: Payer: Self-pay | Admitting: *Deleted

## 2017-03-21 NOTE — Telephone Encounter (Signed)
No problems post procedure. 

## 2017-03-25 ENCOUNTER — Telehealth: Payer: Self-pay | Admitting: *Deleted

## 2017-03-25 ENCOUNTER — Telehealth: Payer: Self-pay | Admitting: Pain Medicine

## 2017-03-25 ENCOUNTER — Encounter: Payer: Self-pay | Admitting: Family Medicine

## 2017-03-25 ENCOUNTER — Telehealth: Payer: Self-pay | Admitting: Family Medicine

## 2017-03-25 ENCOUNTER — Ambulatory Visit (INDEPENDENT_AMBULATORY_CARE_PROVIDER_SITE_OTHER): Payer: Medicare HMO | Admitting: Family Medicine

## 2017-03-25 ENCOUNTER — Other Ambulatory Visit: Payer: Self-pay | Admitting: Family Medicine

## 2017-03-25 VITALS — BP 148/86 | HR 82 | Temp 97.5°F | Wt 205.5 lb

## 2017-03-25 DIAGNOSIS — R42 Dizziness and giddiness: Secondary | ICD-10-CM | POA: Insufficient documentation

## 2017-03-25 DIAGNOSIS — I10 Essential (primary) hypertension: Secondary | ICD-10-CM | POA: Diagnosis not present

## 2017-03-25 DIAGNOSIS — E876 Hypokalemia: Secondary | ICD-10-CM | POA: Diagnosis not present

## 2017-03-25 LAB — BASIC METABOLIC PANEL
BUN: 9 mg/dL (ref 6–23)
CHLORIDE: 101 meq/L (ref 96–112)
CO2: 29 meq/L (ref 19–32)
Calcium: 9.1 mg/dL (ref 8.4–10.5)
Creatinine, Ser: 0.78 mg/dL (ref 0.40–1.20)
GFR: 95.04 mL/min (ref >60.00)
GLUCOSE: 78 mg/dL (ref 70–99)
Potassium: 3.3 mEq/L — ABNORMAL LOW (ref 3.5–5.1)
SODIUM: 138 meq/L (ref 135–145)

## 2017-03-25 MED ORDER — POTASSIUM CHLORIDE CRYS ER 20 MEQ PO TBCR: 40.0000 meq | EXTENDED_RELEASE_TABLET | Freq: Two times a day (BID) | ORAL | 0 refills | Status: DC

## 2017-03-25 MED ORDER — AMLODIPINE BESYLATE 10 MG PO TABS: 10.0000 mg | ORAL_TABLET | Freq: Every day | ORAL | 3 refills | Status: DC

## 2017-03-25 MED ORDER — MECLIZINE HCL 25 MG PO TABS: 25.0000 mg | ORAL_TABLET | Freq: Three times a day (TID) | ORAL | 1 refills | Status: DC | PRN

## 2017-03-25 NOTE — Assessment & Plan Note (Signed)
New problem. Discussed medication vs vestibular rehab. Patient elected for medication. Treating with meclizine. Follow-up with PCP in 2 weeks.

## 2017-03-25 NOTE — Telephone Encounter (Signed)
Patient is reported to have extreme dizziness since 03/22/17, she has stagger in her walk almost falling .  *transferred to Nurse Line.

## 2017-03-25 NOTE — Telephone Encounter (Signed)
fyi patient is scheduled with Dr.Cook today

## 2017-03-25 NOTE — Assessment & Plan Note (Signed)
Uncontrolled, worsening. Increasing Norvasc to 10 mg daily.

## 2017-03-25 NOTE — Telephone Encounter (Signed)
Voicemail left with patient to get additional information re; left foot pain.

## 2017-03-25 NOTE — Patient Instructions (Signed)
I have increased the norvasc to 10 mg daily.  Meclizine for dizziness.  Follow up with Dr. Caryl Bis in 2 weeks.  Take care  Dr. Lacinda Axon

## 2017-03-25 NOTE — Telephone Encounter (Signed)
Patient was taken off of HCTZ given hypokalemia that was persistent. Agree with evaluation in the office.

## 2017-03-25 NOTE — Telephone Encounter (Signed)
Patient Name: Maria Hamilton  DOB: 02/24/1951    Initial Comment Caller states patient has been dizzy since Friday. Light headed, almost falling at times.    Nurse Assessment  Nurse: Raphael Gibney, RN, Vanita Ingles Date/Time (Eastern Time): 03/25/2017 9:15:57 AM  Confirm and document reason for call. If symptomatic, describe symptoms. ---Caller states spouse has been dizzy and lightheaded since Friday. Has almost fallen due to dizziness. Dr. Caryl Bis has taken her off her antihypertensives.  Does the patient have any new or worsening symptoms? ---Yes  Will a triage be completed? ---Yes  Related visit to physician within the last 2 weeks? ---No  Does the PT have any chronic conditions? (i.e. diabetes, asthma, etc.) ---Yes  List chronic conditions. ---HTN  Is this a behavioral health or substance abuse call? ---No     Guidelines    Guideline Title Affirmed Question Affirmed Notes  Dizziness - Lightheadedness [1] MODERATE dizziness (e.g., interferes with normal activities) AND [2] has NOT been evaluated by physician for this (Exception: dizziness caused by heat exposure, sudden standing, or poor fluid intake)    Final Disposition User   See Physician within 24 Hours Raphael Gibney, RN, Vanita Ingles    Comments  appt scheduled with Dr. Thersa Salt on 03/25/2017 at 1:15 pm.   Referrals  REFERRED TO PCP OFFICE   Disagree/Comply: Leta Baptist

## 2017-03-25 NOTE — Telephone Encounter (Signed)
Patient has appointment with Dr Lacinda Axon at 3:45 today.

## 2017-03-25 NOTE — Telephone Encounter (Signed)
Patient is having pain into left foot now since procedure / please call patient to discuss if this is normal

## 2017-03-25 NOTE — Progress Notes (Signed)
Pre visit review using our clinic review tool, if applicable. No additional management support is needed unless otherwise documented below in the visit note. 

## 2017-03-25 NOTE — Assessment & Plan Note (Signed)
BMP today

## 2017-03-25 NOTE — Progress Notes (Signed)
Subjective:  Patient ID: Maria Hamilton, female    DOB: [birth year]  Age: 66 y.o. MRN: [id number]  CC: Dizziness, HTN  HPI:  66 year old female with hypertension and chronic pain presents with the above complaints.  Patient reports that she's had dizziness for the past few weeks. She states that it occurs with motion. She describes it as feeling as if the room is spinning. Last for about 30-40 minutes and then resolves following rest. No known inciting factor. Her blood pressure has been elevated. Her HCTZ was recently discontinued secondary to hypokalemia. No associated nausea or vomiting. No vision changes. Exacerbated by activity. No other associative symptoms. No other complaints or concerns this time.   Additionally, BP has been elevated. She endorses compliance with her medications.   Social Hx   Social History   Social History  . Marital status: Married    Spouse name: N/A  . Number of children: N/A  . Years of education: N/A   Social History Main Topics  . Smoking status: Never Smoker  . Smokeless tobacco: Never Used  . Alcohol use No  . Drug use: No  . Sexual activity: Yes   Other Topics Concern  . None   Social History Narrative  . None    Review of Systems  Constitutional: Negative.   Neurological: Positive for dizziness.   Objective:  BP (!) 148/86   Pulse 82   Temp 97.5 F (36.4 C) (Oral)   Wt 205 lb 8 oz (93.2 kg)   SpO2 96%   BMI 27.87 kg/m   BP/Weight 03/25/2017 03/20/2017 03/12/2017  Systolic BP 148 161 132  Diastolic BP 86 93 70  Wt. (Lbs) 205.5 202 198  BMI 27.87 27.4 26.12   Physical Exam  Constitutional: She is oriented to person, place, and time. She appears well-developed. No distress.  Cardiovascular: Normal rate and regular rhythm.   Pulmonary/Chest: Effort normal and breath sounds normal.  Neurological: She is alert and oriented to person, place, and time.  Negative Dix-Hallpike.  Psychiatric: She has a normal mood and affect.    Vitals reviewed.   Lab Results  Component Value Date   WBC 7.3 03/06/2017   HGB 12.7 03/06/2017   HCT 38.3 03/06/2017   PLT 351.0 03/06/2017   GLUCOSE 108 (H) 03/08/2017   ALT 23 03/06/2017   AST 26 03/06/2017   NA 137 03/08/2017   K 2.9 (L) 03/08/2017   CL 95 (L) 03/08/2017   CREATININE 0.75 03/08/2017   BUN 5 (L) 03/08/2017   CO2 33 (H) 03/08/2017   TSH 0.38 03/06/2017    Assessment & Plan:   Problem List Items Addressed This Visit    Vertigo - Primary    New problem. Discussed medication vs vestibular rehab. Patient elected for medication. Treating with meclizine. Follow-up with PCP in 2 weeks.      Hypokalemia    BMP today.      Relevant Orders   Basic Metabolic Panel (BMET)   Hypertension    Uncontrolled, worsening. Increasing Norvasc to 10 mg daily.       Relevant Medications   amLODipine (NORVASC) 10 MG tablet      Meds ordered this encounter  Medications  . amLODipine (NORVASC) 10 MG tablet    Sig: Take 1 tablet (10 mg total) by mouth daily.    Dispense:  90 tablet    Refill:  3  . meclizine (ANTIVERT) 25 MG tablet    Sig: Take 1 tablet (25 mg total)  by mouth 3 (three) times daily as needed for dizziness.    Dispense:  30 tablet    Refill:  1   Follow-up: Return in about 2 weeks (around 04/08/2017).  Everlene Other DO [facility]

## 2017-03-26 ENCOUNTER — Telehealth: Payer: Self-pay | Admitting: Family Medicine

## 2017-03-26 ENCOUNTER — Telehealth: Payer: Self-pay

## 2017-03-26 NOTE — Telephone Encounter (Signed)
States pain starts in the left foot and goes up the leg to the knee. Numbness in bottoms of left and right feet. Pain has been going on for several days. Dr. Dossie Arbour notified. Have patient come in close to 14 days after the procedure. Could do procedure, except insurance requires PA. Have patient come for eval. Patient advised to give the procedure more time to work.

## 2017-03-26 NOTE — Telephone Encounter (Signed)
Pt says her left foot is killing her and the pain is shooting up her leg. Please give patient a call asap

## 2017-03-26 NOTE — Telephone Encounter (Addendum)
Pt called stating meclizine needed a PA this was completed and sent to plan for approval.

## 2017-03-26 NOTE — Telephone Encounter (Signed)
Patient called sobbing, says she is in extreme pain please call asap

## 2017-03-27 NOTE — Telephone Encounter (Signed)
PA was approved until 03/26/2019.

## 2017-03-28 ENCOUNTER — Telehealth: Payer: Self-pay | Admitting: *Deleted

## 2017-03-28 NOTE — Telephone Encounter (Signed)
Needs Vestibular rehab and PCP follow up.

## 2017-03-28 NOTE — Telephone Encounter (Signed)
Patient advised she is willing to go for referral for vestibular rehab.

## 2017-03-28 NOTE — Telephone Encounter (Signed)
Patient was seen in the office on 04/09, she was diagnosed with vertigo, she was prescribed meclizine. Patient reported taking the medication as prescribed, however, she continues with the same symptoms of being dizzy. She has no other symptoms to report.  Pt contact [phone]

## 2017-03-28 NOTE — Telephone Encounter (Signed)
Seen on 4/9 taking meclizine continues to have dizziness. No others to report.    Called patient to see if she would go to vestibular rehab and she declined referral . Meclizine not helping with dizziness. Please advise.

## 2017-03-29 ENCOUNTER — Other Ambulatory Visit: Payer: Self-pay | Admitting: Family Medicine

## 2017-03-29 DIAGNOSIS — R42 Dizziness and giddiness: Secondary | ICD-10-CM

## 2017-04-02 ENCOUNTER — Ambulatory Visit: Payer: Medicare HMO | Attending: Pain Medicine | Admitting: Pain Medicine

## 2017-04-02 ENCOUNTER — Telehealth: Payer: Self-pay

## 2017-04-02 ENCOUNTER — Encounter: Payer: Self-pay | Admitting: Pain Medicine

## 2017-04-02 VITALS — BP 131/74 | HR 82 | Temp 98.1°F | Resp 16 | Ht 73.0 in

## 2017-04-02 DIAGNOSIS — K859 Acute pancreatitis without necrosis or infection, unspecified: Secondary | ICD-10-CM | POA: Diagnosis not present

## 2017-04-02 DIAGNOSIS — R002 Palpitations: Secondary | ICD-10-CM | POA: Insufficient documentation

## 2017-04-02 DIAGNOSIS — Z885 Allergy status to narcotic agent status: Secondary | ICD-10-CM | POA: Insufficient documentation

## 2017-04-02 DIAGNOSIS — R200 Anesthesia of skin: Secondary | ICD-10-CM | POA: Diagnosis not present

## 2017-04-02 DIAGNOSIS — M5442 Lumbago with sciatica, left side: Secondary | ICD-10-CM | POA: Diagnosis not present

## 2017-04-02 DIAGNOSIS — Z880 Allergy status to penicillin: Secondary | ICD-10-CM | POA: Insufficient documentation

## 2017-04-02 DIAGNOSIS — I10 Essential (primary) hypertension: Secondary | ICD-10-CM | POA: Insufficient documentation

## 2017-04-02 DIAGNOSIS — M48061 Spinal stenosis, lumbar region without neurogenic claudication: Secondary | ICD-10-CM | POA: Insufficient documentation

## 2017-04-02 DIAGNOSIS — K5900 Constipation, unspecified: Secondary | ICD-10-CM | POA: Diagnosis not present

## 2017-04-02 DIAGNOSIS — E559 Vitamin D deficiency, unspecified: Secondary | ICD-10-CM | POA: Insufficient documentation

## 2017-04-02 DIAGNOSIS — F419 Anxiety disorder, unspecified: Secondary | ICD-10-CM | POA: Insufficient documentation

## 2017-04-02 DIAGNOSIS — G4700 Insomnia, unspecified: Secondary | ICD-10-CM | POA: Insufficient documentation

## 2017-04-02 DIAGNOSIS — Z79899 Other long term (current) drug therapy: Secondary | ICD-10-CM | POA: Insufficient documentation

## 2017-04-02 DIAGNOSIS — M797 Fibromyalgia: Secondary | ICD-10-CM | POA: Diagnosis not present

## 2017-04-02 DIAGNOSIS — F329 Major depressive disorder, single episode, unspecified: Secondary | ICD-10-CM | POA: Insufficient documentation

## 2017-04-02 DIAGNOSIS — E876 Hypokalemia: Secondary | ICD-10-CM | POA: Insufficient documentation

## 2017-04-02 DIAGNOSIS — Z809 Family history of malignant neoplasm, unspecified: Secondary | ICD-10-CM | POA: Insufficient documentation

## 2017-04-02 DIAGNOSIS — M791 Myalgia: Secondary | ICD-10-CM | POA: Diagnosis not present

## 2017-04-02 DIAGNOSIS — M961 Postlaminectomy syndrome, not elsewhere classified: Secondary | ICD-10-CM | POA: Diagnosis not present

## 2017-04-02 DIAGNOSIS — M792 Neuralgia and neuritis, unspecified: Secondary | ICD-10-CM

## 2017-04-02 DIAGNOSIS — Z96643 Presence of artificial hip joint, bilateral: Secondary | ICD-10-CM | POA: Insufficient documentation

## 2017-04-02 DIAGNOSIS — M7918 Myalgia, other site: Secondary | ICD-10-CM

## 2017-04-02 DIAGNOSIS — M069 Rheumatoid arthritis, unspecified: Secondary | ICD-10-CM | POA: Diagnosis not present

## 2017-04-02 DIAGNOSIS — B0229 Other postherpetic nervous system involvement: Secondary | ICD-10-CM | POA: Insufficient documentation

## 2017-04-02 DIAGNOSIS — M79605 Pain in left leg: Secondary | ICD-10-CM | POA: Diagnosis not present

## 2017-04-02 DIAGNOSIS — R208 Other disturbances of skin sensation: Secondary | ICD-10-CM | POA: Diagnosis not present

## 2017-04-02 DIAGNOSIS — Z8249 Family history of ischemic heart disease and other diseases of the circulatory system: Secondary | ICD-10-CM | POA: Insufficient documentation

## 2017-04-02 DIAGNOSIS — Z9889 Other specified postprocedural states: Secondary | ICD-10-CM | POA: Insufficient documentation

## 2017-04-02 DIAGNOSIS — Z981 Arthrodesis status: Secondary | ICD-10-CM | POA: Insufficient documentation

## 2017-04-02 DIAGNOSIS — Z888 Allergy status to other drugs, medicaments and biological substances status: Secondary | ICD-10-CM | POA: Insufficient documentation

## 2017-04-02 DIAGNOSIS — K219 Gastro-esophageal reflux disease without esophagitis: Secondary | ICD-10-CM | POA: Insufficient documentation

## 2017-04-02 DIAGNOSIS — Z79891 Long term (current) use of opiate analgesic: Secondary | ICD-10-CM | POA: Insufficient documentation

## 2017-04-02 DIAGNOSIS — G8929 Other chronic pain: Secondary | ICD-10-CM | POA: Diagnosis not present

## 2017-04-02 DIAGNOSIS — Z811 Family history of alcohol abuse and dependence: Secondary | ICD-10-CM | POA: Insufficient documentation

## 2017-04-02 DIAGNOSIS — G894 Chronic pain syndrome: Secondary | ICD-10-CM | POA: Insufficient documentation

## 2017-04-02 DIAGNOSIS — Z90710 Acquired absence of both cervix and uterus: Secondary | ICD-10-CM | POA: Insufficient documentation

## 2017-04-02 DIAGNOSIS — J209 Acute bronchitis, unspecified: Secondary | ICD-10-CM | POA: Insufficient documentation

## 2017-04-02 DIAGNOSIS — Z9049 Acquired absence of other specified parts of digestive tract: Secondary | ICD-10-CM | POA: Insufficient documentation

## 2017-04-02 DIAGNOSIS — M5416 Radiculopathy, lumbar region: Secondary | ICD-10-CM

## 2017-04-02 DIAGNOSIS — R42 Dizziness and giddiness: Secondary | ICD-10-CM | POA: Insufficient documentation

## 2017-04-02 MED ORDER — TIZANIDINE HCL 2 MG PO CAPS: 2.0000 mg | ORAL_CAPSULE | Freq: Three times a day (TID) | ORAL | 5 refills | Status: DC | PRN

## 2017-04-02 MED ORDER — TRAMADOL HCL 50 MG PO TABS: 100.0000 mg | ORAL_TABLET | Freq: Four times a day (QID) | ORAL | 5 refills | Status: DC | PRN

## 2017-04-02 MED ORDER — GABAPENTIN 300 MG PO CAPS: 300.0000 mg | ORAL_CAPSULE | Freq: Three times a day (TID) | ORAL | 5 refills | Status: DC

## 2017-04-02 NOTE — Telephone Encounter (Signed)
Pharmacy wants to know can tizanidine be changed from capsules to tablets because insurance will cover the tablets  Please call pharmacy @ [phone]

## 2017-04-02 NOTE — Progress Notes (Signed)
Safety precautions to be maintained throughout the outpatient stay will include: orient to surroundings, keep bed in low position, maintain call bell within reach at all times, provide assistance with transfer out of bed and ambulation.  

## 2017-04-02 NOTE — Progress Notes (Signed)
Patient's Name: Maria Hamilton  MRN: [id number]  Referring Provider: Glori Luis, MD  DOB: 09-11-1951  PCP: Glori Luis, MD  DOS: 04/02/2017  Note by: Sydnee Levans. Laban Emperor, MD  Service setting: Ambulatory outpatient  Specialty: Interventional Pain Management  Location: [facility] (AMB) Pain Management Facility    Patient type: Established   Primary Reason(s) for Visit: Encounter for prescription drug management & post-procedure evaluation of chronic illness with mild to moderate exacerbation(Level of risk: moderate) CC: Numbness (of lower extremity)  HPI  Maria Hamilton is a 66 y.o. year old, female patient, who comes today for a post-procedure evaluation and medication management. She has GERD (gastroesophageal reflux disease); Rheumatoid factor positive; Vitamin D deficiency; Insomnia; Fibromyalgia; Chronic low back pain (Location of Primary Source of Pain) (Left); Chronic pain syndrome; Depression; Hypertension; 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); Long term current use of opiate analgesic; Long term prescription opiate use; Opiate use; Encounter for therapeutic drug level monitoring; Encounter for pain management planning; Rheumatoid arthritis, multiple sites (positive RF); Chronic lower extremity pain (Location of Secondary source of pain) (Left); Lumbar facet arthropathy (Bilateral); Failed back surgical syndrome (x 3); Epidural fibrosis; Neurogenic pain; Musculoskeletal pain; Chronic lumbar radicular pain (L5/S1 dermatome) (Location of Secondary source of pain) (Left); History of total hip replacement, bilateral; Hypokalemia; Constipation; Therapeutic opioid-induced constipation (OIC); Palpitations; Epigastric pain; Memory difficulty; Polyneuropathy; Post herpetic neuralgia; Abnormal MRI, lumbar spine (03/06/2017); 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 Her primarily concern today is the Numbness (of lower extremity)  Pain Assessment: Self-Reported Pain Score: 5 /10 Clinically the patient looks like a 1/10 Reported level is inconsistent with clinical observations. Information on the proper use of the pain scale provided to the patient today Pain Type: Chronic pain Pain Location: Back Pain Descriptors / Indicators: Aching, Throbbing, Numbness, Discomfort, Grimacing (numbness is new and effects all toes except great toe--left) Pain Frequency: Constant  Maria Hamilton was last seen on 03/25/2017 for a procedure. During today's appointment we reviewed Maria Hamilton post-procedure results, as well as her outpatient medication regimen. This patient is rather difficult to read. Today I had to go over all of her symptoms in order to figure out or was going on with her. Initially the patient had indicated that her primary pain was that of the lower back and her lower extremities. She did not respond to a caudal epidural steroid injection which is the approach of choice when dealing with a failed back surgery syndrome. Because she failed to respond to 2 of those, an MRI was done where foraminal and central stenosis were found at the L2-3 level. On the patient's last interventional procedure we went up into this level and the results have been completely different. Here we were able to completely eliminate her low back pain and most of her leg pain and at this point her primary concern is that of numbness distal portion of both of her legs from the knee down and into the area of the bottom of her feet suggesting an S1 dermatomal distribution. Because today her pain was perceived not to be significant, we have decided to hold on any further injections. However, should she require further treatment, we will simply repeat the translaminar and transforaminal L2-3 epidural steroid injections since these seem to have worked a lot better than the patient was  provided with PRN orders for those injections.  Further  details on both, my assessment(s), as well as the proposed treatment plan, please see below.  Controlled Substance Pharmacotherapy Assessment REMS (Risk Evaluation and Mitigation Strategy)  Analgesic:Tramadol 100 mg 4 times a day (400 mg/dayof tramadol) The patient ran out of medication on 01/19/17. MME/day:40mg /day.   Newman Pies, RN  04/02/2017 10:49 AM  Sign at close encounter Safety precautions to be maintained throughout the outpatient stay will include: orient to surroundings, keep bed in low position, maintain call bell within reach at all times, provide assistance with transfer out of bed and ambulation.    Pharmacokinetics: Liberation and absorption (onset of action): WNL Distribution (time to peak effect): WNL Metabolism and excretion (duration of action): WNL         Pharmacodynamics: Desired effects: Analgesia: Maria Hamilton reports >50% benefit. Functional ability: Patient reports that medication allows her to accomplish basic ADLs Clinically meaningful improvement in function (CMIF): Sustained CMIF goals met Perceived effectiveness: Described as relatively effective, allowing for increase in activities of daily living (ADL) Undesirable effects: Side-effects or Adverse reactions: None reported Monitoring: [place] PMP: Online review of the past 25-month period conducted. Compliant with practice rules and regulations List of all UDS test(s) done:  Lab Results  Component Value Date   TOXASSSELUR FINAL 03/12/2017   TOXASSSELUR FINAL 01/15/2017   SUMMARY FINAL 09/24/2016   Last UDS on record: ToxAssure Select 13  Date Value Ref Range Status  03/12/2017 FINAL  Final    Comment:    ==================================================================== TOXASSURE SELECT 13 (MW) ==================================================================== Test                             Result       Flag       Units Drug  Present and Declared for Prescription Verification   Tramadol                       PRESENT      EXPECTED   O-Desmethyltramadol            PRESENT      EXPECTED   N-Desmethyltramadol            PRESENT      EXPECTED    Source of tramadol is a prescription medication.    O-desmethyltramadol and N-desmethyltramadol are expected    metabolites of tramadol. ==================================================================== Test                      Result    Flag   Units      Ref Range   Creatinine              88               mg/dL      >=66 ==================================================================== Declared Medications:  The flagging and interpretation on this report are based on the  following declared medications.  Unexpected results may arise from  inaccuracies in the declared medications.  **Note: The testing scope of this panel includes these medications:  Tramadol (Ultram)  **Note: The testing scope of this panel does not include following  reported medications:  Amlodipine (Norvasc)  Citalopram (Lexapro)  Cyanocobalamin  Gabapentin (Neurontin)  Potassium (K-Dur)  Potassium (Klor-Con)  Tizanidine  Valacyclovir (Valtrex) ==================================================================== For clinical consultation, please call [phone]. ====================================================================    UDS interpretation: Compliant          Medication Assessment Form: Reviewed. Patient indicates being compliant  with therapy Treatment compliance: Compliant Risk Assessment Profile: Aberrant behavior: See prior evaluations. None observed or detected today Comorbid factors increasing risk of overdose: See prior notes. No additional risks detected today Risk of substance use disorder (SUD): Low Opioid Risk Tool (ORT) Total Score: 3  Interpretation Table:  Score <3 = Low Risk for SUD  Score between 4-7 = Moderate Risk for SUD  Score >8 = High Risk for  Opioid Abuse   Risk Mitigation Strategies:  Patient Counseling: Covered Patient-Prescriber Agreement (PPA): Present and active  Notification to other healthcare providers: Done  Pharmacologic Plan: No change in therapy, at this time  Post-Procedure Assessment  03/25/2017 Procedure: Diagnostic left L2-3 Interlaminar lumbar epidural steroid injection #1 + left L2-3 transforaminal epidural steroid injection #1 under fluoroscopic guidance and IV sedation Pre-procedure pain score:  8/10 Post-procedure pain score: 0/10 (100% relief) Influential Factors: BMI:   Intra-procedural challenges: None observed Assessment challenges: None detected         Post-procedural side-effects, adverse reactions, or complications: None reported Reported issues: None  Sedation: Sedation provided. When no sedatives are used, the analgesic levels obtained are directly associated to the effectiveness of the local anesthetics. However, when sedation is provided, the level of analgesia obtained during the initial 1 hour following the intervention, is believed to be the result of a combination of factors. These factors may include, but are not limited to: 1. The effectiveness of the local anesthetics used. 2. The effects of the analgesic(s) and/or anxiolytic(s) used. 3. The degree of discomfort experienced by the patient at the time of the procedure. 4. The patients ability and reliability in recalling and recording the events. 5. The presence and influence of possible secondary gains and/or psychosocial factors. Reported result: Relief experienced during the 1st hour after the procedure: 100 % (Ultra-Short Term Relief) Interpretative annotation: Analgesia during this period is likely to be Local Anesthetic and/or IV Sedative (Analgesic/Anxiolitic) related.          Effects of local anesthetic: The analgesic effects attained during this period are directly associated to the localized infiltration of local anesthetics and  therefore cary significant diagnostic value as to the etiological location, or anatomical origin, of the pain. Expected duration of relief is directly dependent on the pharmacodynamics of the local anesthetic used. Long-acting (4-6 hours) anesthetics used.  Reported result: Relief during the next 4 to 6 hour after the procedure: 100 % (Pt states she went to sleep and the pain woke her up and continues to get worse- pt did not bring diary today) (Short-Term Relief) Interpretative annotation: Complete relief would suggest area to be the source of the pain.          Long-term benefit: Defined as the period of time past the expected duration of local anesthetics. With the possible exception of prolonged sympathetic blockade from the local anesthetics, benefits during this period are typically attributed to, or associated with, other factors such as analgesic sensory neuropraxia, antiinflammatory effects, or beneficial biochemical changes provided by agents other than the local anesthetics Reported result: Extended relief following procedure: 100 % (Long-Term Relief) the patient's low back pain is completely gone despite the fact that it was her primary complaint. At this point, the symptoms that she has is that of pain from the knee down to the anterior portion of the leg into the the bottom of her feet Dyke Brackett a symptom that she is experiencing in his numbness. Interpretative annotation: Good relief. This could suggest inflammation to be a significant  component in the etiology to the pain.          Current benefits: Defined as persistent relief that continues at this point in time.   Reported results: Treated area: 100 % Maria Hamilton reports improvement in function. In addition she has attained 100% relief of her low back pain. Currently she is concerned about her numbness but in view of the fact that she has had some surgery around the L5-S1 region, it is very likely that epidural fibrosis cancer some of this  numbness. Interpretative annotation: Ongoing benefits would suggest effective therapeutic approach  Interpretation: Results would suggest a successful diagnostic intervention.          Laboratory Chemistry  Inflammation Markers Lab Results  Component Value Date   CRP <0.8 09/25/2016   ESRSEDRATE 39 (H) 09/25/2016   (CRP: Acute Phase) (ESR: Chronic Phase) Renal Function Markers Lab Results  Component Value Date   BUN 9 03/25/2017   CREATININE 0.78 03/25/2017   GFRAA >60 11/15/2016   GFRNONAA >60 11/15/2016   Hepatic Function Markers Lab Results  Component Value Date   AST 26 03/06/2017   ALT 23 03/06/2017   ALBUMIN 3.9 03/06/2017   ALKPHOS 114 03/06/2017   Electrolytes Lab Results  Component Value Date   NA 138 03/25/2017   K 3.3 (L) 03/25/2017   CL 101 03/25/2017   CALCIUM 9.1 03/25/2017   MG 1.9 09/25/2016   Neuropathy Markers Lab Results  Component Value Date   VITAMINB12 446 03/06/2017   Bone Pathology Markers Lab Results  Component Value Date   ALKPHOS 114 03/06/2017   25OHVITD1 14 (L) 09/25/2016   25OHVITD2 6.4 09/25/2016   25OHVITD3 7.7 09/25/2016   CALCIUM 9.1 03/25/2017   Coagulation Parameters Lab Results  Component Value Date   PLT 351.0 03/06/2017   Cardiovascular Markers Lab Results  Component Value Date   HGB 12.7 03/06/2017   HCT 38.3 03/06/2017   Note: Lab results reviewed.  Recent Diagnostic Imaging Review  Dg C-arm 1-60 Min-no Report  Result Date: 03/20/2017 Fluoroscopy was utilized by the requesting physician.  No radiographic interpretation.   Note: Imaging results reviewed.          Meds  The patient has a current medication list which includes the following prescription(s): amlodipine, escitalopram, gabapentin, meclizine, potassium chloride sa, tizanidine, tramadol, valacyclovir, and vitamin b-12.  Current Outpatient Prescriptions on File Prior to Visit  Medication Sig  . amLODipine (NORVASC) 10 MG tablet Take 1 tablet  (10 mg total) by mouth daily.  Marland Kitchen escitalopram (LEXAPRO) 20 MG tablet TAKE 1 TABLET(20 MG) BY MOUTH DAILY  . meclizine (ANTIVERT) 25 MG tablet Take 1 tablet (25 mg total) by mouth 3 (three) times daily as needed for dizziness.  . potassium chloride SA (K-DUR,KLOR-CON) 20 MEQ tablet Take 2 tablets (40 mEq total) by mouth 2 (two) times daily.  . valACYclovir (VALTREX) 1000 MG tablet Take 1 tablet (1,000 mg total) by mouth 3 (three) times daily.  . vitamin B-12 (CYANOCOBALAMIN) 1000 MCG tablet Take 1,000 mcg by mouth daily.   No current facility-administered medications on file prior to visit.    ROS  Constitutional: Denies any fever or chills Gastrointestinal: No reported hemesis, hematochezia, vomiting, or acute GI distress Musculoskeletal: Denies any acute onset joint swelling, redness, loss of ROM, or weakness Neurological: No reported episodes of acute onset apraxia, aphasia, dysarthria, agnosia, amnesia, paralysis, loss of coordination, or loss of consciousness  Allergies  Maria Hamilton is allergic to ace inhibitors; ciprofloxacin;  fentanyl; morphine and related; penicillin g benzathine; and tape.  PFSH  Drug: Maria Hamilton  reports that she does not use drugs. Alcohol:  reports that she does not drink alcohol. Tobacco:  reports that she has never smoked. She has never used smokeless tobacco. Medical:  has a past medical history of Acute anxiety (08/03/2015); Acute bronchitis (12/31/2016); Acute pancreatitis (10/14/2016); Altered mental status (10/12/2016); Anxiety; Chronic hip pain; Chronic pain syndrome; Depression; Edema; Fatigue; Fibromyalgia; GERD (gastroesophageal reflux disease); Headache; Hypertension; Insomnia; Low back pain; Right upper quadrant abdominal pain; and Vitamin D deficiency. Family: family history includes Alcohol abuse in her father; Cancer in her father; Heart disease in her mother.  Past Surgical History:  Procedure Laterality Date  . ABDOMINAL HYSTERECTOMY    .  BACK SURGERY    . BREAST SURGERY    . CHOLECYSTECTOMY N/A 10/16/2016   Procedure: LAPAROSCOPIC CHOLECYSTECTOMY WITH INTRAOPERATIVE CHOLANGIOGRAM;  Surgeon: Tiney Rouge III, MD;  Location: [facility] ORS;  Service: General;  Laterality: N/A;  . cyst removal from wrist    . ECTOPIC PREGNANCY SURGERY    . HIP SURGERY     x4   Constitutional Exam  General appearance: Well nourished, well developed, and well hydrated. In no apparent acute distress Vitals:   04/02/17 1046  BP: 131/74  Pulse: 82  Resp: 16  Temp: 98.1 F (36.7 C)  SpO2: 100%  Height: 6\' 1"  (1.854 m)   BMI Assessment: Estimated body mass index is 27.87 kg/m as calculated from the following:   Height as of 03/20/17: 6' (1.829 m).   Weight as of 03/25/17: 205 lb 8 oz (93.2 kg).  BMI interpretation table: BMI level Category Range association with higher incidence of chronic pain  <18 kg/m2 Underweight   18.5-24.9 kg/m2 Ideal body weight   25-29.9 kg/m2 Overweight Increased incidence by 20%  30-34.9 kg/m2 Obese (Class I) Increased incidence by 68%  35-39.9 kg/m2 Severe obesity (Class II) Increased incidence by 136%  >40 kg/m2 Extreme obesity (Class III) Increased incidence by 254%   BMI Readings from Last 4 Encounters:  03/25/17 27.87 kg/m  03/20/17 27.40 kg/m  03/12/17 26.12 kg/m  03/06/17 26.68 kg/m   Wt Readings from Last 4 Encounters:  03/25/17 205 lb 8 oz (93.2 kg)  03/20/17 202 lb (91.6 kg)  03/12/17 198 lb (89.8 kg)  03/06/17 202 lb 3.2 oz (91.7 kg)  Psych/Mental status: Alert, oriented x 3 (person, place, & time)       Eyes: PERLA Respiratory: No evidence of acute respiratory distress  Cervical Spine Exam  Inspection: No masses, redness, or swelling Alignment: Symmetrical Functional ROM: Unrestricted ROM Stability: No instability detected Muscle strength & Tone: Functionally intact Sensory: Unimpaired Palpation: No palpable anomalies  Upper Extremity (UE) Exam    Side: Right upper extremity  Side: Left  upper extremity  Inspection: No masses, redness, swelling, or asymmetry. No contractures  Inspection: No masses, redness, swelling, or asymmetry. No contractures  Functional ROM: Unrestricted ROM          Functional ROM: Unrestricted ROM          Muscle strength & Tone: Functionally intact  Muscle strength & Tone: Functionally intact  Sensory: Unimpaired  Sensory: Unimpaired  Palpation: No palpable anomalies  Palpation: No palpable anomalies  Specialized Test(s): Deferred         Specialized Test(s): Deferred          Thoracic Spine Exam  Inspection: No masses, redness, or swelling Alignment: Symmetrical Functional ROM: Unrestricted ROM  Stability: No instability detected Sensory: Unimpaired Muscle strength & Tone: No palpable anomalies  Lumbar Spine Exam  Inspection: No masses, redness, or swelling Alignment: Symmetrical Functional ROM: Unrestricted ROM Stability: No instability detected Muscle strength & Tone: Functionally intact Sensory: Unimpaired Palpation: No palpable anomalies Provocative Tests: Lumbar Hyperextension and rotation test: evaluation deferred today       Patrick's Maneuver: evaluation deferred today              Gait & Posture Assessment  Ambulation: Unassisted Gait: Relatively normal for age and body habitus Posture: WNL   Lower Extremity Exam    Side: Right lower extremity  Side: Left lower extremity  Inspection: No masses, redness, swelling, or asymmetry. No contractures  Inspection: No masses, redness, swelling, or asymmetry. No contractures  Functional ROM: Unrestricted ROM          Functional ROM: Unrestricted ROM          Muscle strength & Tone: Functionally intact  Muscle strength & Tone: Functionally intact  Sensory: Unimpaired  Sensory: Unimpaired  Palpation: No palpable anomalies  Palpation: No palpable anomalies   Assessment  Primary Diagnosis & Pertinent Problem List: The primary encounter diagnosis was Chronic foot numbness (Left). Diagnoses  of Chronic low back pain (Location of Primary Source of Pain) (Left), Chronic lower extremity pain (Location of Secondary source of pain) (Left), Chronic lumbar radicular pain (L5/S1 dermatome) (Location of Secondary source of pain) (Left), Failed back surgical syndrome (x 3), Chronic pain syndrome, Musculoskeletal pain, and Neurogenic pain were also pertinent to this visit.  Status Diagnosis  Controlled Controlled Controlled 1. Chronic foot numbness (Left)   2. Chronic low back pain (Location of Primary Source of Pain) (Left)   3. Chronic lower extremity pain (Location of Secondary source of pain) (Left)   4. Chronic lumbar radicular pain (L5/S1 dermatome) (Location of Secondary source of pain) (Left)   5. Failed back surgical syndrome (x 3)   6. Chronic pain syndrome   7. Musculoskeletal pain   8. Neurogenic pain      Plan of Care  Pharmacotherapy (Medications Ordered): Meds ordered this encounter  Medications  . traMADol (ULTRAM) 50 MG tablet    Sig: Take 2 tablets (100 mg total) by mouth every 6 (six) hours as needed for moderate pain or severe pain.    Dispense:  240 tablet    Refill:  5    Do not place medication on "Automatic Refill". Fill one day early if pharmacy is closed on scheduled refill date.  . tizanidine (ZANAFLEX) 2 MG capsule    Sig: Take 1 capsule (2 mg total) by mouth 3 (three) times daily as needed for muscle spasms.    Dispense:  90 capsule    Refill:  5    Do not place this medication, or any other prescription from our practice, on "Automatic Refill". Patient may have prescription filled one day early if pharmacy is closed on scheduled refill date.  . gabapentin (NEURONTIN) 300 MG capsule    Sig: Take 1-3 capsules (300-900 mg total) by mouth every 8 (eight) hours. Follow titration schedule.    Dispense:  270 capsule    Refill:  5    Do not place this medication, or any other prescription from our practice, on "Automatic Refill". Patient may have prescription  filled one day early if pharmacy is closed on scheduled refill date.   New Prescriptions   No medications on file   Medications administered today: Maria Hamilton had no  medications administered during this visit. Lab-work, procedure(s), and/or referral(s): Orders Placed This Encounter  Procedures  . Lumbar Epidural Injection  . Lumbar Transforaminal Epidural   Imaging and/or referral(s): None  Interventional therapies: Planned, scheduled, and/or pending:   Not at this time.   Considering:   Left L2-3 translaminar lumbar epidural steroid injection #2 + left L2-3 transforaminal epidural steroid injection #2  Repeat diagnostic left sided caudal epidural steroid injection +epidurogram. Possible RACZepidurolysis of adhesions.   Palliative PRN treatment(s):   Left L2-3 translaminar lumbar epidural steroid injection #2 + left L2-3 transforaminal epidural steroid injection #2 under fluoroscopic guidance and IV sedation.    Provider-requested follow-up: Return in about 6 months (around 10/02/2017) for (Nurse Practitioner) Med-Mgmt, in addition, (PRN) procedure.  Future Appointments Date Time Provider Department Center  10/02/2017 9:00 AM Crystal Cameron Ali, NP [facility] None   Primary Care Physician: Glori Luis, MD Location: [facility] Note by: Sydnee Levans. Laban Emperor, M.D, DABA, DABAPM, DABPM, DABIPP, FIPP Date: 04/02/2017; Time: 5:53 PM  Pain Score Disclaimer: We use the NRS-11 scale. This is a self-reported, subjective measurement of pain severity with only modest accuracy. It is used primarily to identify changes within a particular patient. It must be understood that outpatient pain scales are significantly less accurate that those used for research, where they can be applied under ideal controlled circumstances with minimal exposure to variables. In reality, the score is likely to be a combination of pain intensity and pain affect, where pain affect  describes the degree of emotional arousal or changes in action readiness caused by the sensory experience of pain. Factors such as social and work situation, setting, emotional state, anxiety levels, expectation, and prior pain experience may influence pain perception and show large inter-individual differences that may also be affected by time variables.  Patient instructions provided during this appointment: Patient Instructions     You have prescriptions at the pharmacy to be picked up.  You have been given a script for tramadol today.  You have been given pre procedure instructions today.  Epidural Steroid Injection An epidural steroid injection is given to relieve pain in your neck, back, or legs that is caused by the irritation or swelling of a nerve root. This procedure involves injecting a steroid and numbing medicine (anesthetic) into the epidural space. The epidural space is the space between the outer covering of your spinal cord and the bones that form your backbone (vertebra).  LET [facility] CARE PROVIDER KNOW ABOUT:   Any allergies you have.  All medicines you are taking, including vitamins, herbs, eye drops, creams, and over-the-counter medicines such as aspirin.  Previous problems you or members of your family have had with the use of anesthetics.  Any blood disorders or blood clotting disorders you have.  Previous surgeries you have had.  Medical conditions you have.  RISKS AND COMPLICATIONS Generally, this is a safe procedure. However, as with any procedure, complications can occur. Possible complications of epidural steroid injection include:  Headache.  Bleeding.  Infection.  Allergic reaction to the medicines.  Damage to your nerves. The response to this procedure depends on the underlying cause of the pain and its duration. People who have long-term (chronic) pain are less likely to benefit from epidural steroids than are those people whose pain comes on  strong and suddenly.  BEFORE THE PROCEDURE   Ask your health care provider about changing or stopping your regular medicines. You may be advised to stop taking  blood-thinning medicines a few days before the procedure.  You may be given medicines to reduce anxiety.  Arrange for someone to take you home after the procedure.  PROCEDURE   You will remain awake during the procedure. You may receive medicine to make you relaxed.  You will be asked to lie on your stomach.  The injection site will be cleaned.  The injection site will be numbed with a medicine (local anesthetic).  A needle will be injected through your skin into the epidural space.  Your health care provider will use an X-ray machine to ensure that the steroid is delivered closest to the affected nerve. You may have minimal discomfort at this time.  Once the needle is in the right position, the local anesthetic and the steroid will be injected into the epidural space.  The needle will then be removed and a bandage will be applied to the injection site.  AFTER THE PROCEDURE   You may be monitored for a short time before you go home.  You may feel weakness or numbness in your arm or leg, which disappears within hours.  You may be allowed to eat, drink, and take your regular medicine.  You may have soreness at the site of the injection.   This information is not intended to replace advice given to you by your health care provider. Make sure you discuss any questions you have with your health care provider.   Document Released: 03/11/2008 Document Revised: 08/05/2013 Document Reviewed: 05/22/2013 Elsevier Interactive Patient Education 2016 [employer]  GENERAL RISKS AND COMPLICATIONS  What are the risk, side effects and possible complications? Generally speaking, most procedures are safe.  However, with any procedure there are risks, side effects, and the possibility of complications.  The risks and complications  are dependent upon the sites that are lesioned, or the type of nerve block to be performed.  The closer the procedure is to the spine, the more serious the risks are.  Great care is taken when placing the radio frequency needles, block needles or lesioning probes, but sometimes complications can occur. Infection: Any time there is an injection through the skin, there is a risk of infection.  This is why sterile conditions are used for these blocks. There are four possible types of infection: 1. Localized skin infection. 2. Central Nervous System Infection: This can be in the form of Meningitis, which can be deadly. 3. Epidural Infections: This can be in the form of an epidural abscess, which can cause pressure inside of the spine, causing compression of the spinal cord with subsequent paralysis. This would require an emergency surgery to decompress, and there are no guarantees that the patient would recover from the paralysis. 4. Discitis: This is an infection of the intervertebral discs. It occurs in about 1% of discography procedures. It is difficult to treat and it may lead to surgery. Pain: the needles have to go through skin and soft tissues, will cause soreness. Damage to internal structures:  The nerves to be lesioned may be near blood vessels or other nerves which can be potentially damaged. Bleeding: Bleeding is more common if the patient is taking blood thinners such as  aspirin, Coumadin, Ticiid, Plavix, etc., or if he/she have some genetic predisposition such as hemophilia. Bleeding into the spinal canal can cause compression of the spinal  cord with subsequent paralysis.  This would require an emergency surgery to decompress and there are no guarantees that the patient would recover from the paralysis.  Pneumothorax: Puncturing of a lung is a possibility, every time a needle is introduced in the area of the chest or upper back.  Pneumothorax refers to free air around the collapsed lung(s),  inside of the thoracic cavity (chest cavity).  Another two possible complications related to a similar event would include: Hemothorax and Chylothorax. These are variations of the Pneumothorax, where instead of air around the collapsed lung(s), you may have blood or chyle, respectively. Spinal headaches: They may occur with any procedures in the area of the spine. Persistent CSF (Cerebro-Spinal Fluid) leakage: This is a rare problem, but may occur with prolonged intrathecal or epidural catheters either due to the formation of a fistulous track or a dural tear. Nerve damage: By working so close to the spinal cord, there is always a possibility of nerve damage, which could be as serious as a permanent spinal cord injury with paralysis. Death: Although rare, severe deadly allergic reactions known as "Anaphylactic reaction" can occur to any of the medications used. Worsening of the symptoms: We can always make thing worse.  What are the chances of something like this happening? Chances of any of this occuring are extremely low.  By statistics, you have more of a chance of getting killed in a motor vehicle accident: while driving to the hospital than any of the above occurring .  Nevertheless, you should be aware that they are possibilities.  In general, it is similar to taking a shower.  Everybody knows that you can slip, hit your head and get killed.  Does that mean that you should not shower again?  Nevertheless always keep in mind that statistics do not mean anything if you happen to be on the wrong side of them.  Even if a procedure has a 1 (one) in a 1,000,000 (million) chance of going wrong, it you happen to be that one..Also, keep in mind that by statistics, you have more of a chance of having something go wrong when taking medications.  Who should not have this procedure? If you are on a blood thinning medication (e.g. Coumadin, Plavix, see list of "Blood Thinners"), or if you have an active infection  going on, you should not have the procedure.  If you are taking any blood thinners, please inform your physician.  Preparing for your procedure: 1. Do not eat or drink anything at least eight (8) hours prior to the procedure. 2. Bring a driver with you .  It cannot be a taxi. 3. Come accompanied by an adult that can drive you back, and that is strong enough to help you if your legs get weak or numb from the local anesthetic. 4. Take all of your medicines the morning of the procedure with just enough water to swallow them. 5. If you have diabetes, make sure that you are scheduled to have your procedure done first thing in the morning, whenever possible. 6. If you have diabetes, take only half of your insulin dose and notify our nurse that you have done so as soon as you arrive at the clinic. 7. If you are diabetic, but only take blood sugar pills (oral hypoglycemic), then do not take them on the morning of your procedure.  You may take them after you have had the procedure. 8. Do not take aspirin or any aspirin-containing medications, at least eleven (11) days prior to the procedure.  They may prolong bleeding. 9. Wear loose fitting clothing that may be easy to take off and that you would  not mind if it got stained with Betadine or blood. 10. Do not wear any jewelry or perfume 11. Remove any nail coloring.  It will interfere with some of our monitoring equipment. 12. If you take Metformin for your diabetes, stop it 48 hours prior to the procedure.  NOTE: Remember that this is not meant to be interpreted as a complete list of all possible complications.  Unforeseen problems may occur.  BLOOD THINNERS The following drugs contain aspirin or other products, which can cause increased bleeding during surgery and should not be taken for 2 weeks prior to and 1 week after surgery.  If you should need take something for relief of minor pain, you may take acetaminophen which is found in Tylenol,m Datril,  Anacin-3 and Panadol. It is not blood thinner. The products listed below are.  Do not take any of the products listed below in addition to any listed on your instruction sheet.  A.P.C or A.P.C with Codeine Codeine Phosphate Capsules #3 Ibuprofen Ridaura  ABC compound Congesprin Imuran rimadil  Advil Cope Indocin Robaxisal  Alka-Seltzer Effervescent Pain Reliever and Antacid Coricidin or Coricidin-D  Indomethacin Rufen  Alka-Seltzer plus Cold Medicine Cosprin Ketoprofen S-A-C Tablets  Anacin Analgesic Tablets or Capsules Coumadin Korlgesic Salflex  Anacin Extra Strength Analgesic tablets or capsules CP-2 Tablets Lanoril Salicylate  Anaprox Cuprimine Capsules Levenox Salocol  Anexsia-D Dalteparin Magan Salsalate  Anodynos Darvon compound Magnesium Salicylate Sine-off  Ansaid Dasin Capsules Magsal Sodium Salicylate  Anturane Depen Capsules Marnal Soma  APF Arthritis pain formula Dewitt's Pills Measurin Stanback  Argesic Dia-Gesic Meclofenamic Sulfinpyrazone  Arthritis Bayer Timed Release Aspirin Diclofenac Meclomen Sulindac  Arthritis pain formula Anacin Dicumarol Medipren Supac  Analgesic (Safety coated) Arthralgen Diffunasal Mefanamic Suprofen  Arthritis Strength Bufferin Dihydrocodeine Mepro Compound Suprol  Arthropan liquid Dopirydamole Methcarbomol with Aspirin Synalgos  ASA tablets/Enseals Disalcid Micrainin Tagament  Ascriptin Doan's Midol Talwin  Ascriptin A/D Dolene Mobidin Tanderil  Ascriptin Extra Strength Dolobid Moblgesic Ticlid  Ascriptin with Codeine Doloprin or Doloprin with Codeine Momentum Tolectin  Asperbuf Duoprin Mono-gesic Trendar  Aspergum Duradyne Motrin or Motrin IB Triminicin  Aspirin plain, buffered or enteric coated Durasal Myochrisine Trigesic  Aspirin Suppositories Easprin Nalfon Trillsate  Aspirin with Codeine Ecotrin Regular or Extra Strength Naprosyn Uracel  Atromid-S Efficin Naproxen Ursinus  Auranofin Capsules Elmiron Neocylate Vanquish  Axotal  Emagrin Norgesic Verin  Azathioprine Empirin or Empirin with Codeine Normiflo Vitamin E  Azolid Emprazil Nuprin Voltaren  Bayer Aspirin plain, buffered or children's or timed BC Tablets or powders Encaprin Orgaran Warfarin Sodium  Buff-a-Hamilton Enoxaparin Orudis Zorpin  [place] with Codeine Equegesic Os-Cal-Gesic   Buffaprin Excedrin plain, buffered or Extra Strength Oxalid   Bufferin Arthritis Strength Feldene Oxphenbutazone   Bufferin plain or Extra Strength Feldene Capsules Oxycodone with Aspirin   Bufferin with Codeine Fenoprofen Fenoprofen Pabalate or Pabalate-SF   Buffets II Flogesic Panagesic   Buffinol plain or Extra Strength Florinal or Florinal with Codeine Panwarfarin   Buf-Tabs Flurbiprofen Penicillamine   Butalbital Compound Four-way cold tablets Penicillin   Butazolidin Fragmin Pepto-Bismol   Carbenicillin Geminisyn Percodan   Carna Arthritis Reliever Geopen Persantine   Carprofen Gold's salt Persistin   Chloramphenicol Goody's Phenylbutazone   Chloromycetin Haltrain Piroxlcam   Clmetidine heparin Plaquenil   Cllnoril Hyco-pap Ponstel   Clofibrate Hydroxy chloroquine Propoxyphen         Before stopping any of these medications, be sure to consult the physician who ordered them.  Some, such as Coumadin (Warfarin) are ordered to  prevent or treat serious conditions such as "deep thrombosis", "pumonary embolisms", and other heart problems.  The amount of time that you may need off of the medication may also vary with the medication and the reason for which you were taking it.  If you are taking any of these medications, please make sure you notify your pain physician before you undergo any procedures.Preparing for Procedure with Sedation Instructions: . Oral Intake: Do not eat or drink anything for at least 8 hours prior to your procedure. . Transportation: Public transportation is not allowed. Bring an adult driver. The driver must be physically present in our waiting room  before any procedure can be started. Marland Kitchen Physical Assistance: Bring an adult capable of physically assisting you, in the event you need help. . Blood Pressure Medicine: Take your blood pressure medicine with a sip of water the morning of the procedure. . Insulin: Take only  of your normal insulin dose. . Preventing infections: Shower with an antibacterial soap the morning of your procedure. . Build-up your immune system: Take 1000 mg of Vitamin C with every meal (3 times a day) the day prior to your procedure. . Pregnancy: If you are pregnant, call and cancel the procedure. . Sickness: If you have a cold, fever, or any active infections, call and cancel the procedure. . Arrival: You must be in the facility at least 30 minutes prior to your scheduled procedure. . Children: Do not bring children with you. . Dress appropriately: Bring dark clothing that you would not mind if they get stained. . Valuables: Do not bring any jewelry or valuables. Procedure appointments are reserved for interventional treatments only. Marland Kitchen No Prescription Refills. . No medication changes will be discussed during procedure appointments. No disability issues will be discussed.

## 2017-04-02 NOTE — Patient Instructions (Addendum)
You have prescriptions at the pharmacy to be picked up.  You have been given a script for tramadol today.  You have been given pre procedure instructions today.  Epidural Steroid Injection An epidural steroid injection is given to relieve pain in your neck, back, or legs that is caused by the irritation or swelling of a nerve root. This procedure involves injecting a steroid and numbing medicine (anesthetic) into the epidural space. The epidural space is the space between the outer covering of your spinal cord and the bones that form your backbone (vertebra).  LET [facility] CARE PROVIDER KNOW ABOUT:   Any allergies you have.  All medicines you are taking, including vitamins, herbs, eye drops, creams, and over-the-counter medicines such as aspirin.  Previous problems you or members of your family have had with the use of anesthetics.  Any blood disorders or blood clotting disorders you have.  Previous surgeries you have had.  Medical conditions you have.  RISKS AND COMPLICATIONS Generally, this is a safe procedure. However, as with any procedure, complications can occur. Possible complications of epidural steroid injection include:  Headache.  Bleeding.  Infection.  Allergic reaction to the medicines.  Damage to your nerves. The response to this procedure depends on the underlying cause of the pain and its duration. People who have long-term (chronic) pain are less likely to benefit from epidural steroids than are those people whose pain comes on strong and suddenly.  BEFORE THE PROCEDURE   Ask your health care provider about changing or stopping your regular medicines. You may be advised to stop taking blood-thinning medicines a few days before the procedure.  You may be given medicines to reduce anxiety.  Arrange for someone to take you home after the procedure.  PROCEDURE   You will remain awake during the procedure. You may receive medicine to make you relaxed.  You  will be asked to lie on your stomach.  The injection site will be cleaned.  The injection site will be numbed with a medicine (local anesthetic).  A needle will be injected through your skin into the epidural space.  Your health care provider will use an X-ray machine to ensure that the steroid is delivered closest to the affected nerve. You may have minimal discomfort at this time.  Once the needle is in the right position, the local anesthetic and the steroid will be injected into the epidural space.  The needle will then be removed and a bandage will be applied to the injection site.  AFTER THE PROCEDURE   You may be monitored for a short time before you go home.  You may feel weakness or numbness in your arm or leg, which disappears within hours.  You may be allowed to eat, drink, and take your regular medicine.  You may have soreness at the site of the injection.   This information is not intended to replace advice given to you by your health care provider. Make sure you discuss any questions you have with your health care provider.   Document Released: 03/11/2008 Document Revised: 08/05/2013 Document Reviewed: 05/22/2013 Elsevier Interactive Patient Education 2016 [employer]  What are the risk, side effects and possible complications? Generally speaking, most procedures are safe.  However, with any procedure there are risks, side effects, and the possibility of complications.  The risks and complications are dependent upon the sites that are lesioned, or the type of nerve block to be performed.  The closer  the procedure is to the spine, the more serious the risks are.  Great care is taken when placing the radio frequency needles, block needles or lesioning probes, but sometimes complications can occur. Infection: Any time there is an injection through the skin, there is a risk of infection.  This is why sterile conditions are used for these  blocks. There are four possible types of infection: 1. Localized skin infection. 2. Central Nervous System Infection: This can be in the form of Meningitis, which can be deadly. 3. Epidural Infections: This can be in the form of an epidural abscess, which can cause pressure inside of the spine, causing compression of the spinal cord with subsequent paralysis. This would require an emergency surgery to decompress, and there are no guarantees that the patient would recover from the paralysis. 4. Discitis: This is an infection of the intervertebral discs. It occurs in about 1% of discography procedures. It is difficult to treat and it may lead to surgery. Pain: the needles have to go through skin and soft tissues, will cause soreness. Damage to internal structures:  The nerves to be lesioned may be near blood vessels or other nerves which can be potentially damaged. Bleeding: Bleeding is more common if the patient is taking blood thinners such as  aspirin, Coumadin, Ticiid, Plavix, etc., or if he/she have some genetic predisposition such as hemophilia. Bleeding into the spinal canal can cause compression of the spinal  cord with subsequent paralysis.  This would require an emergency surgery to decompress and there are no guarantees that the patient would recover from the paralysis. Pneumothorax: Puncturing of a lung is a possibility, every time a needle is introduced in the area of the chest or upper back.  Pneumothorax refers to free air around the collapsed lung(s), inside of the thoracic cavity (chest cavity).  Another two possible complications related to a similar event would include: Hemothorax and Chylothorax. These are variations of the Pneumothorax, where instead of air around the collapsed lung(s), you may have blood or chyle, respectively. Spinal headaches: They may occur with any procedures in the area of the spine. Persistent CSF (Cerebro-Spinal Fluid) leakage: This is a rare problem, but may  occur with prolonged intrathecal or epidural catheters either due to the formation of a fistulous track or a dural tear. Nerve damage: By working so close to the spinal cord, there is always a possibility of nerve damage, which could be as serious as a permanent spinal cord injury with paralysis. Death: Although rare, severe deadly allergic reactions known as "Anaphylactic reaction" can occur to any of the medications used. Worsening of the symptoms: We can always make thing worse.  What are the chances of something like this happening? Chances of any of this occuring are extremely low.  By statistics, you have more of a chance of getting killed in a motor vehicle accident: while driving to the hospital than any of the above occurring .  Nevertheless, you should be aware that they are possibilities.  In general, it is similar to taking a shower.  Everybody knows that you can slip, hit your head and get killed.  Does that mean that you should not shower again?  Nevertheless always keep in mind that statistics do not mean anything if you happen to be on the wrong side of them.  Even if a procedure has a 1 (one) in a 1,000,000 (million) chance of going wrong, it you happen to be that one..Also, keep in mind that by  statistics, you have more of a chance of having something go wrong when taking medications.  Who should not have this procedure? If you are on a blood thinning medication (e.g. Coumadin, Plavix, see list of "Blood Thinners"), or if you have an active infection going on, you should not have the procedure.  If you are taking any blood thinners, please inform your physician.  Preparing for your procedure: 1. Do not eat or drink anything at least eight (8) hours prior to the procedure. 2. Bring a driver with you .  It cannot be a taxi. 3. Come accompanied by an adult that can drive you back, and that is strong enough to help you if your legs get weak or numb from the local anesthetic. 4. Take all  of your medicines the morning of the procedure with just enough water to swallow them. 5. If you have diabetes, make sure that you are scheduled to have your procedure done first thing in the morning, whenever possible. 6. If you have diabetes, take only half of your insulin dose and notify our nurse that you have done so as soon as you arrive at the clinic. 7. If you are diabetic, but only take blood sugar pills (oral hypoglycemic), then do not take them on the morning of your procedure.  You may take them after you have had the procedure. 8. Do not take aspirin or any aspirin-containing medications, at least eleven (11) days prior to the procedure.  They may prolong bleeding. 9. Wear loose fitting clothing that may be easy to take off and that you would not mind if it got stained with Betadine or blood. 10. Do not wear any jewelry or perfume 11. Remove any nail coloring.  It will interfere with some of our monitoring equipment. 12. If you take Metformin for your diabetes, stop it 48 hours prior to the procedure.  NOTE: Remember that this is not meant to be interpreted as a complete list of all possible complications.  Unforeseen problems may occur.  BLOOD THINNERS The following drugs contain aspirin or other products, which can cause increased bleeding during surgery and should not be taken for 2 weeks prior to and 1 week after surgery.  If you should need take something for relief of minor pain, you may take acetaminophen which is found in Tylenol,m Datril, Anacin-3 and Panadol. It is not blood thinner. The products listed below are.  Do not take any of the products listed below in addition to any listed on your instruction sheet.  A.P.C or A.P.C with Codeine Codeine Phosphate Capsules #3 Ibuprofen Ridaura  ABC compound Congesprin Imuran rimadil  Advil Cope Indocin Robaxisal  Alka-Seltzer Effervescent Pain Reliever and Antacid Coricidin or Coricidin-D  Indomethacin Rufen  Alka-Seltzer plus  Cold Medicine Cosprin Ketoprofen S-A-C Tablets  Anacin Analgesic Tablets or Capsules Coumadin Korlgesic Salflex  Anacin Extra Strength Analgesic tablets or capsules CP-2 Tablets Lanoril Salicylate  Anaprox Cuprimine Capsules Levenox Salocol  Anexsia-D Dalteparin Magan Salsalate  Anodynos Darvon compound Magnesium Salicylate Sine-off  Ansaid Dasin Capsules Magsal Sodium Salicylate  Anturane Depen Capsules Marnal Soma  APF Arthritis pain formula Dewitt's Pills Measurin Stanback  Argesic Dia-Gesic Meclofenamic Sulfinpyrazone  Arthritis Bayer Timed Release Aspirin Diclofenac Meclomen Sulindac  Arthritis pain formula Anacin Dicumarol Medipren Supac  Analgesic (Safety coated) Arthralgen Diffunasal Mefanamic Suprofen  Arthritis Strength Bufferin Dihydrocodeine Mepro Compound Suprol  Arthropan liquid Dopirydamole Methcarbomol with Aspirin Synalgos  ASA tablets/Enseals Disalcid Micrainin Tagament  Ascriptin Doan's Midol Talwin  Ascriptin  A/D Dolene Mobidin Tanderil  Ascriptin Extra Strength Dolobid Moblgesic Ticlid  Ascriptin with Codeine Doloprin or Doloprin with Codeine Momentum Tolectin  Asperbuf Duoprin Mono-gesic Trendar  Aspergum Duradyne Motrin or Motrin IB Triminicin  Aspirin plain, buffered or enteric coated Durasal Myochrisine Trigesic  Aspirin Suppositories Easprin Nalfon Trillsate  Aspirin with Codeine Ecotrin Regular or Extra Strength Naprosyn Uracel  Atromid-S Efficin Naproxen Ursinus  Auranofin Capsules Elmiron Neocylate Vanquish  Axotal Emagrin Norgesic Verin  Azathioprine Empirin or Empirin with Codeine Normiflo Vitamin E  Azolid Emprazil Nuprin Voltaren  Bayer Aspirin plain, buffered or children's or timed BC Tablets or powders Encaprin Orgaran Warfarin Sodium  Buff-a-Comp Enoxaparin Orudis Zorpin  Buff-a-Comp with Codeine Equegesic Os-Cal-Gesic   Buffaprin Excedrin plain, buffered or Extra Strength Oxalid   Bufferin Arthritis Strength Feldene Oxphenbutazone   Bufferin  plain or Extra Strength Feldene Capsules Oxycodone with Aspirin   Bufferin with Codeine Fenoprofen Fenoprofen Pabalate or Pabalate-SF   Buffets II Flogesic Panagesic   Buffinol plain or Extra Strength Florinal or Florinal with Codeine Panwarfarin   Buf-Tabs Flurbiprofen Penicillamine   Butalbital Compound Four-way cold tablets Penicillin   Butazolidin Fragmin Pepto-Bismol   Carbenicillin Geminisyn Percodan   Carna Arthritis Reliever Geopen Persantine   Carprofen Gold's salt Persistin   Chloramphenicol Goody's Phenylbutazone   Chloromycetin Haltrain Piroxlcam   Clmetidine heparin Plaquenil   Cllnoril Hyco-pap Ponstel   Clofibrate Hydroxy chloroquine Propoxyphen         Before stopping any of these medications, be sure to consult the physician who ordered them.  Some, such as Coumadin (Warfarin) are ordered to prevent or treat serious conditions such as "deep thrombosis", "pumonary embolisms", and other heart problems.  The amount of time that you may need off of the medication may also vary with the medication and the reason for which you were taking it.  If you are taking any of these medications, please make sure you notify your pain physician before you undergo any procedures.Preparing for Procedure with Sedation Instructions: . Oral Intake: Do not eat or drink anything for at least 8 hours prior to your procedure. . Transportation: Public transportation is not allowed. Bring an adult driver. The driver must be physically present in our waiting room before any procedure can be started. Marland Kitchen Physical Assistance: Bring an adult capable of physically assisting you, in the event you need help. . Blood Pressure Medicine: Take your blood pressure medicine with a sip of water the morning of the procedure. . Insulin: Take only  of your normal insulin dose. . Preventing infections: Shower with an antibacterial soap the morning of your procedure. . Build-up your immune system: Take 1000 mg of Vitamin  C with every meal (3 times a day) the day prior to your procedure. . Pregnancy: If you are pregnant, call and cancel the procedure. . Sickness: If you have a cold, fever, or any active infections, call and cancel the procedure. . Arrival: You must be in the facility at least 30 minutes prior to your scheduled procedure. . Children: Do not bring children with you. . Dress appropriately: Bring dark clothing that you would not mind if they get stained. . Valuables: Do not bring any jewelry or valuables. Procedure appointments are reserved for interventional treatments only. Marland Kitchen No Prescription Refills. . No medication changes will be discussed during procedure appointments. No disability issues will be discussed.

## 2017-04-09 ENCOUNTER — Other Ambulatory Visit: Payer: Self-pay | Admitting: Family Medicine

## 2017-04-17 ENCOUNTER — Ambulatory Visit: Payer: Medicare HMO | Admitting: Pain Medicine

## 2017-05-13 ENCOUNTER — Other Ambulatory Visit: Payer: Self-pay | Admitting: Family Medicine

## 2017-06-04 ENCOUNTER — Ambulatory Visit: Payer: Medicare HMO | Admitting: Nurse Practitioner

## 2017-06-14 ENCOUNTER — Emergency Department
Admission: EM | Admit: 2017-06-14 | Discharge: 2017-06-14 | Disposition: A | Discharge status: Home / Self Care | Payer: Medicare HMO | Attending: Emergency Medicine | Admitting: Emergency Medicine

## 2017-06-14 ENCOUNTER — Encounter: Payer: Self-pay | Admitting: Emergency Medicine

## 2017-06-14 DIAGNOSIS — M79605 Pain in left leg: Secondary | ICD-10-CM | POA: Diagnosis present

## 2017-06-14 DIAGNOSIS — T404X1A Poisoning by other synthetic narcotics, accidental (unintentional), initial encounter: Secondary | ICD-10-CM | POA: Diagnosis not present

## 2017-06-14 DIAGNOSIS — T50901A Poisoning by unspecified drugs, medicaments and biological substances, accidental (unintentional), initial encounter: Secondary | ICD-10-CM

## 2017-06-14 DIAGNOSIS — I10 Essential (primary) hypertension: Secondary | ICD-10-CM | POA: Diagnosis not present

## 2017-06-14 DIAGNOSIS — Z79899 Other long term (current) drug therapy: Secondary | ICD-10-CM | POA: Diagnosis not present

## 2017-06-14 DIAGNOSIS — M792 Neuralgia and neuritis, unspecified: Secondary | ICD-10-CM

## 2017-06-14 DIAGNOSIS — G629 Polyneuropathy, unspecified: Secondary | ICD-10-CM | POA: Insufficient documentation

## 2017-06-14 DIAGNOSIS — T426X1A Poisoning by other antiepileptic and sedative-hypnotic drugs, accidental (unintentional), initial encounter: Secondary | ICD-10-CM | POA: Insufficient documentation

## 2017-06-14 NOTE — Discharge Instructions (Signed)
Increase gabapentin to 1200 mg (4 tablets) in the morning, 900 mg (3 tablets) midday, and 1200 mg in the evening. Continue this dose and follow up with your doctor and follow up with your pain specialist.

## 2017-06-14 NOTE — ED Triage Notes (Signed)
Pt to ED via POV, pt states that this morning she was in a lot of pain and pt states that she take Gabapentin 900 mg and tramadol 100 mg. Pt states that she was having so much pain that a hour later she took the same dose again. Pt denies trying to harm herself but states that she was in so much pain that took a second dose.  Pt upset and crying in triage.

## 2017-06-14 NOTE — ED Provider Notes (Signed)
[facility] Emergency Department Provider Note  ____________________________________________  Time seen: Approximately 6:58 PM  I have reviewed the triage vital signs and the nursing notes.   HISTORY  Chief Complaint Drug Overdose    HPI Maria Hamilton is a 66 y.o. female complains of chronic left leg pain from neuropathy. She's had this pain for years, sees pain specialist and takes tramadol and gabapentin for it. Recently the pain has been worse, so last night after taking her evening dose of 900 mg of gabapentin and tramadol 100 mg, an hour later she took the same dose of 900 mg gabapentin 100 mg tramadol. Afterward she felt dizzy and nauseated. That has now resolved today, but still having significant pain in the leg. No other acute complaints. No chest pain shortness of breath fevers or chills. No dizziness or syncope. She reports numbness in the foot which is chronic.  No aggravating or alleviating factors. No associated symptoms. Severe. Maria Hamilton   Past Medical History:  Diagnosis Date  . Acute anxiety 08/03/2015   Overview:  Last Assessment & Plan:  Klonopin most helpful  Reports past taking it 1 tab am 1/2 tab midday and 1 tab pm  30 day supply faxed to pharmacy  . Acute bronchitis 12/31/2016  . Acute pancreatitis 10/14/2016  . Altered mental status 10/12/2016  . Anxiety   . Chronic hip pain   . Chronic pain syndrome   . Depression   . Edema   . Fatigue   . Fibromyalgia   . GERD (gastroesophageal reflux disease)   . Headache   . Hypertension   . Insomnia   . Low back pain   . Right upper quadrant abdominal pain   . Vitamin D deficiency      Patient Active Problem List   Diagnosis Date Noted  . Chronic foot numbness (Left) 04/02/2017  . Vertigo 03/25/2017  . 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) 03/20/2017  . History of lumbar fusion (L3-4, L4-5, and L5-S1) 03/20/2017  . Post herpetic neuralgia 03/12/2017  .  Abnormal MRI, lumbar spine (03/06/2017) 03/12/2017  . Memory difficulty 03/06/2017  . Polyneuropathy 03/06/2017  . Palpitations 11/26/2016  . Epigastric pain 11/26/2016  . Therapeutic opioid-induced constipation (OIC) 11/20/2016  . Constipation   . Hypokalemia 09/27/2016  . Long term current use of opiate analgesic 09/24/2016  . Long term prescription opiate use 09/24/2016  . Opiate use 09/24/2016  . Encounter for therapeutic drug level monitoring 09/24/2016  . Encounter for pain management planning 09/24/2016  . Rheumatoid arthritis, multiple sites (positive RF) 09/24/2016  . Chronic lower extremity pain (Location of Secondary source of pain) (Left) 09/24/2016  . Lumbar facet arthropathy (Bilateral) 09/24/2016  . Failed back surgical syndrome (x 3) 09/24/2016  . Epidural fibrosis 09/24/2016  . Neurogenic pain 09/24/2016  . Musculoskeletal pain 09/24/2016  . Chronic lumbar radicular pain (L5/S1 dermatome) (Location of Secondary source of pain) (Left) 09/24/2016  . History of total hip replacement, bilateral 09/24/2016  . GERD (gastroesophageal reflux disease) 08/03/2015  . Rheumatoid factor positive 08/03/2015  . Vitamin D deficiency 08/03/2015  . Insomnia 08/03/2015  . Fibromyalgia 08/03/2015  . Chronic low back pain (Location of Primary Source of Pain) (Left) 08/03/2015  . Chronic pain syndrome 08/03/2015  . Depression 08/03/2015  . Hypertension 08/03/2015  . Mechanical complication of internal joint prosthesis ([place]) 04/22/2012     Past Surgical History:  Procedure Laterality Date  . ABDOMINAL HYSTERECTOMY    . BACK SURGERY    .  BREAST SURGERY    . CHOLECYSTECTOMY N/A 10/16/2016   Procedure: LAPAROSCOPIC CHOLECYSTECTOMY WITH INTRAOPERATIVE CHOLANGIOGRAM;  Surgeon: Dia Crawford III, MD;  Location: [facility] ORS;  Service: General;  Laterality: N/A;  . cyst removal from wrist    . ECTOPIC PREGNANCY SURGERY    . HIP SURGERY     x4     Prior to Admission medications    Medication Sig Start Date End Date Taking? Authorizing Provider  amLODipine (NORVASC) 10 MG tablet Take 1 tablet (10 mg total) by mouth daily. 03/25/17   Coral Spikes, DO  escitalopram (LEXAPRO) 20 MG tablet TAKE 1 TABLET(20 MG) BY MOUTH DAILY 05/14/17   Leone Haven, MD  gabapentin (NEURONTIN) 300 MG capsule Take 1-3 capsules (300-900 mg total) by mouth every 8 (eight) hours. Follow titration schedule. 04/02/17 09/29/17  Milinda Pointer, MD  meclizine (ANTIVERT) 25 MG tablet Take 1 tablet (25 mg total) by mouth 3 (three) times daily as needed for dizziness. 03/25/17   Coral Spikes, DO  potassium chloride SA (K-DUR,KLOR-CON) 20 MEQ tablet Take 2 tablets (40 mEq total) by mouth 2 (two) times daily. 03/25/17   Coral Spikes, DO  tizanidine (ZANAFLEX) 2 MG capsule Take 1 capsule (2 mg total) by mouth 3 (three) times daily as needed for muscle spasms. 04/02/17 09/29/17  Milinda Pointer, MD  traMADol (ULTRAM) 50 MG tablet Take 2 tablets (100 mg total) by mouth every 6 (six) hours as needed for moderate pain or severe pain. 04/02/17 09/29/17  Milinda Pointer, MD  valACYclovir (VALTREX) 1000 MG tablet Take 1 tablet (1,000 mg total) by mouth 3 (three) times daily. 03/06/17   Leone Haven, MD  vitamin B-12 (CYANOCOBALAMIN) 1000 MCG tablet Take 1,000 mcg by mouth daily.    [provider]     Allergies Ace inhibitors; Ciprofloxacin; Fentanyl; Morphine and related; Penicillin g benzathine; and Tape   Family History  Problem Relation Age of Onset  . Heart disease Mother   . Cancer Father   . Alcohol abuse Father     Social History Social History  Substance Use Topics  . Smoking status: Never Smoker  . Smokeless tobacco: Never Used  . Alcohol use No    Review of Systems  Constitutional:   No fever or chills.  ENT:   No sore throat. No rhinorrhea. Cardiovascular:   No chest pain or syncope. Respiratory:   No dyspnea or cough. Gastrointestinal:   Negative for abdominal  pain, vomiting and diarrhea.  Musculoskeletal:   Chronic left leg pain as above   All other systems reviewed and are negative except as documented above in ROS and HPI.  ____________________________________________   PHYSICAL EXAM:  VITAL SIGNS: ED Triage Vitals  Enc Vitals Group     BP 06/14/17 1506 (!) 143/101     Pulse Rate 06/14/17 1506 77     Resp 06/14/17 1506 16     Temp 06/14/17 1506 99 F (37.2 C)     Temp Source 06/14/17 1506 Oral     SpO2 06/14/17 1506 100 %     Weight 06/14/17 1458 205 lb (93 kg)     Height --      Head Circumference --      Peak Flow --      Pain Score 06/14/17 1458 8     Pain Loc --      Pain Edu? --      Excl. in [place]? --     Vital signs reviewed,  nursing assessments reviewed.   Constitutional:   Alert and oriented. Well appearing and in no distress. Eyes:   No scleral icterus.  EOMI. No nystagmus.  ENT   Head:   Normocephalic and atraumatic.     Neck:   No meningismus. Full ROM Cardiovascular:   RRR. Symmetric bilateral radial and DP pulses.  No murmurs.  Respiratory:   Normal respiratory effort without tachypnea/retractions.  Musculoskeletal:   Normal range of motion in all extremities. No joint effusions.  No lower extremity tenderness.  No edema. Neurologic:   Normal speech and language.  Motor grossly intact. Normal plantar reflexes in the left foot No gross focal neurologic deficits are appreciated.  Skin:    Skin is warm, dry and intact. No rash noted.  No petechiae, purpura, or bullae. Normal capillary refill in the toes of the left foot. No necrosis or other lesions in the left leg or foot  ____________________________________________    LABS (pertinent positives/negatives) (all labs ordered are listed, but only abnormal results are displayed) Labs Reviewed - No data to display ____________________________________________   EKG    ____________________________________________    RADIOLOGY  No results  found.  ____________________________________________   PROCEDURES Procedures  ____________________________________________   INITIAL IMPRESSION / ASSESSMENT AND PLAN / ED COURSE  Pertinent labs & imaging results that were available during my care of the patient were reviewed by me and considered in my medical decision making (see chart for details).  Patient presents complaining of acute worsening of her chronic neuropathic pain in the left leg and foot. Intact perfusion and no evidence of ischemia. No evidence of soft tissue infection. Notes a fracture dislocation or other acute issues. Advised to increase her gabapentin, follow-up with her primary care and pain specialist. Discharge home.      ____________________________________________   FINAL CLINICAL IMPRESSION(S) / ED DIAGNOSES  Final diagnoses:  Neuropathic pain  Accidental drug overdose, initial encounter      New Prescriptions   No medications on file     Portions of this note were generated with dragon dictation software. Dictation errors may occur despite best attempts at proofreading.    Carrie Mew, MD 06/14/17 1902

## 2017-06-17 ENCOUNTER — Telehealth: Payer: Self-pay

## 2017-06-17 NOTE — Telephone Encounter (Signed)
Patient states she is having severe leg pain and went to the ER-. Patient is going to schedule a procedure

## 2017-06-17 NOTE — Telephone Encounter (Signed)
She has a question about a medication he gave her for her nerve in her foot. Please call her.

## 2017-07-31 ENCOUNTER — Ambulatory Visit: Payer: Medicare HMO | Admitting: Family

## 2017-07-31 DIAGNOSIS — Z0289 Encounter for other administrative examinations: Secondary | ICD-10-CM

## 2017-08-02 ENCOUNTER — Other Ambulatory Visit: Payer: Self-pay | Admitting: Family Medicine

## 2017-08-13 ENCOUNTER — Encounter: Payer: Self-pay | Admitting: Family Medicine

## 2017-08-13 ENCOUNTER — Ambulatory Visit (INDEPENDENT_AMBULATORY_CARE_PROVIDER_SITE_OTHER): Payer: Medicare HMO | Admitting: Family Medicine

## 2017-08-13 VITALS — BP 152/96 | HR 67 | Temp 98.1°F | Wt 201.4 lb

## 2017-08-13 DIAGNOSIS — M5442 Lumbago with sciatica, left side: Secondary | ICD-10-CM

## 2017-08-13 DIAGNOSIS — Z1239 Encounter for other screening for malignant neoplasm of breast: Secondary | ICD-10-CM

## 2017-08-13 DIAGNOSIS — Z1231 Encounter for screening mammogram for malignant neoplasm of breast: Secondary | ICD-10-CM | POA: Diagnosis not present

## 2017-08-13 DIAGNOSIS — G8929 Other chronic pain: Secondary | ICD-10-CM | POA: Diagnosis not present

## 2017-08-13 DIAGNOSIS — G629 Polyneuropathy, unspecified: Secondary | ICD-10-CM

## 2017-08-13 DIAGNOSIS — R61 Generalized hyperhidrosis: Secondary | ICD-10-CM | POA: Insufficient documentation

## 2017-08-13 DIAGNOSIS — R222 Localized swelling, mass and lump, trunk: Secondary | ICD-10-CM

## 2017-08-13 NOTE — Assessment & Plan Note (Signed)
Patient with neuropathy. Does not feel the gabapentin has been beneficial. We'll forward a note to our pharmacist to determine the best method of tapering off of gabapentin to get her onto Lyrica. She'll follow-up with pain management.

## 2017-08-13 NOTE — Assessment & Plan Note (Signed)
Patient reports hot flashes and sweats that mostly occur at night though do occur during the day periodically. She notes the sweats are drenching at night. She's not had any weight loss. She does have some palpable lymph nodes in her inguinal area bilaterally though they do not feel significantly enlarged. She does report an area of fullness in the left supraclavicular area though there is no discrete mass palpated in this area. We'll check lab work as outlined below. Discussed checking CT scan neck and chest though when attempting to order these things for night sweats and supraclavicular fullness I was unable to find a diagnosis that fit in the guidelines that have been newly put in place. We will order an ultrasound of the left supraclavicular area. We will have our referral coordinator check to see what diagnoses could work for that for the CT scans to be covered.

## 2017-08-13 NOTE — Patient Instructions (Signed)
Nice to see you. Please contact the pain clinic. We'll contact you regarding how to come off of your gabapentin. We'll contact you with your lab results and get you set up for CT scans.

## 2017-08-13 NOTE — Progress Notes (Signed)
Eric Sonnenberg, MD Phone: [phone]  Maria Hamilton is a 66 y.o. female who presents today for same-day visit.   Patient has chronic low back pain. Mostly on the left side. Started to bother her more sometime in the past several months. No radiation down her legs. Has chronic numbness and tingling in her left foot and lower leg that has been unchanged and is quite severe. Notes severe neuropathy in this leg. No other numbness. No weakness. No saddle anesthesia. No loss of bowel or bladder function. She does not take tramadol. Gabapentin has not been terribly helpful. She takes 1200 mg at night and 900 mg in the morning and in the middle the day. She was following with the pain clinic though hasn't seen them in some time.  Patient notes for the last month or so she's been having hot flashes. Mostly occur at night though occasionally during the day. Gets to the point where she has to change her clothes because she sweats much at night. No weight loss. She does note her left supraclavicular area swelled up at some point.  Abdominal pain: Patient notes she was taking some over-the-counter vitamin supplements and figured out those were causing her abdomen to hurt. She notes she stopped them and the discomfort went away. It has not recurred.  PMH: nonsmoker.   ROS see history of present illness  Objective  Physical Exam Vitals:   08/13/17 1543  BP: (!) 152/96  Pulse: 67  Temp: 98.1 F (36.7 C)  SpO2: 98%    BP Readings from Last 3 Encounters:  08/13/17 (!) 152/96  06/14/17 (!) 184/88  04/02/17 131/74   Wt Readings from Last 3 Encounters:  08/13/17 201 lb 6.4 oz (91.4 kg)  06/14/17 205 lb (93 kg)  03/25/17 205 lb 8 oz (93.2 kg)    Physical Exam  Constitutional: No distress.  HENT:  Head: Normocephalic and atraumatic.  Neck: Neck supple.  Cardiovascular: Normal rate, regular rhythm and normal heart sounds.   Pulmonary/Chest: Effort normal and breath sounds normal.    Abdominal: Soft. Bowel sounds are normal. She exhibits no distension. There is no tenderness.  Lymphadenopathy:       Head (right side): No submental and no submandibular adenopathy present.       Head (left side): No submental and no submandibular adenopathy present.    She has no cervical adenopathy.    She has no axillary adenopathy.       Right: Inguinal (slight small palpable lymph nodes in bilateral inguinal areas) adenopathy present. No supraclavicular adenopathy present.       Left: Inguinal adenopathy present. No supraclavicular (no palpable lymph nodes that there is a fullness to her left supraclavicular area) adenopathy present.  Neurological: She is alert.  5/5 strength bilateral quads, hamstrings, plantar flexion, and dorsiflexion, decreased light touch sensation left foot and distal tibia portion of her lower leg, otherwise light touch sensation intact bilateral lower extremities  Skin: Skin is warm and dry. She is not diaphoretic.     Assessment/Plan: Please see individual problem list.  Chronic low back pain (Location of Primary Source of Pain) (Left) Chronic issue. No acute worsening. Neurologically at baseline in her lower extremities. Discussed getting back in to see the pain clinic.  Polyneuropathy (HCC) Patient with neuropathy. Does not feel the gabapentin has been beneficial. We'll forward a note to our pharmacist to determine the best method of tapering off of gabapentin to get her onto Lyrica. She'll follow-up with pain management.    Night sweats Patient reports hot flashes and sweats that mostly occur at night though do occur during the day periodically. She notes the sweats are drenching at night. She's not had any weight loss. She does have some palpable lymph nodes in her inguinal area bilaterally though they do not feel significantly enlarged. She does report an area of fullness in the left supraclavicular area though there is no discrete mass palpated in this  area. We'll check lab work as outlined below. Discussed checking CT scan neck and chest though when attempting to order these things for night sweats and supraclavicular fullness I was unable to find a diagnosis that fit in the guidelines that have been newly put in place. We will order an ultrasound of the left supraclavicular area. We will have our referral coordinator check to see what diagnoses could work for that for the CT scans to be covered.   Suspect patient's abdominal pain was related to the vitamin supplements she was taking. Benign exam today. She'll monitor for recurrence.  Orders Placed This Encounter  Procedures  . MM SCREENING BREAST TOMO BILATERAL    Standing Status:   Future    Standing Expiration Date:   10/13/2018    Order Specific Question:   Reason for Exam (SYMPTOM  OR DIAGNOSIS REQUIRED)    Answer:   breast cancer screening    Order Specific Question:   Preferred imaging location?    Answer:   [facility]  . US Soft Tissue Head/Neck    Standing Status:   Future    Standing Expiration Date:   10/13/2018    Order Specific Question:   Reason for Exam (SYMPTOM  OR DIAGNOSIS REQUIRED)    Answer:   supraclavicular swelling and prominence on the left    Order Specific Question:   Preferred imaging location?    Answer:   [facility]  . TSH  . Comp Met (CMET)  . CBC  . Sed Rate (ESR)    [name], MD [place]

## 2017-08-13 NOTE — Assessment & Plan Note (Signed)
Chronic issue. No acute worsening. Neurologically at baseline in her lower extremities. Discussed getting back in to see the pain clinic.

## 2017-08-14 ENCOUNTER — Telehealth: Payer: Self-pay | Admitting: Pain Medicine

## 2017-08-14 ENCOUNTER — Other Ambulatory Visit: Payer: Self-pay | Admitting: Family Medicine

## 2017-08-14 DIAGNOSIS — R61 Generalized hyperhidrosis: Secondary | ICD-10-CM

## 2017-08-14 LAB — COMPREHENSIVE METABOLIC PANEL
ALBUMIN: 4.1 g/dL (ref 3.5–5.2)
ALK PHOS: 93 U/L (ref 39–117)
ALT: 8 U/L (ref 0–35)
AST: 16 U/L (ref 0–37)
BILIRUBIN TOTAL: 0.5 mg/dL (ref 0.2–1.2)
BUN: 8 mg/dL (ref 6–23)
CHLORIDE: 101 meq/L (ref 96–112)
CO2: 30 mEq/L (ref 19–32)
CREATININE: 0.76 mg/dL (ref 0.40–1.20)
Calcium: 9.7 mg/dL (ref 8.4–10.5)
GFR: 97.82 mL/min (ref >60.00)
Glucose, Bld: 95 mg/dL (ref 70–99)
Potassium: 3.3 mEq/L — ABNORMAL LOW (ref 3.5–5.1)
Sodium: 139 mEq/L (ref 135–145)
TOTAL PROTEIN: 8.3 g/dL (ref 6.0–8.3)

## 2017-08-14 LAB — CBC
HEMATOCRIT: 40 % (ref 36.0–46.0)
Hemoglobin: 13 g/dL (ref 12.0–15.0)
MCHC: 32.5 g/dL (ref 30.0–36.0)
MCV: 82.2 fl (ref 78.0–100.0)
Platelets: 313 10*3/uL (ref 150.0–400.0)
RBC: 4.86 Mil/uL (ref 3.87–5.11)
RDW: 15 % (ref 11.5–15.5)
WBC: 4 10*3/uL (ref 4.0–10.5)

## 2017-08-14 LAB — SEDIMENTATION RATE: Sed Rate: 17 mm/hr (ref 0–30)

## 2017-08-14 LAB — TSH: TSH: 0.52 u[IU]/mL (ref 0.35–4.50)

## 2017-08-14 NOTE — Telephone Encounter (Signed)
Please schedule for LESI and TFESI.  These are ordered prn.  With sedation.  Please make sure she is not on any blood thinners.  Thank you.

## 2017-08-14 NOTE — Telephone Encounter (Signed)
Patient is having low back pain, neuropathy in left foot and leg. She wants to come in and have a procedure. She has not been seen since April. Do you want her to come in for an evaluation? If not, what procedure would you like to to get authorization for

## 2017-08-14 NOTE — Telephone Encounter (Signed)
Request has been sent to [facility]

## 2017-08-15 ENCOUNTER — Telehealth: Payer: Self-pay | Admitting: Family Medicine

## 2017-08-15 ENCOUNTER — Other Ambulatory Visit: Payer: Self-pay | Admitting: Family Medicine

## 2017-08-15 DIAGNOSIS — E876 Hypokalemia: Secondary | ICD-10-CM

## 2017-08-15 NOTE — Addendum Note (Signed)
Addended by: Leeanne Rio on: 08/15/2017 09:45 AM   Modules accepted: Orders

## 2017-08-15 NOTE — Telephone Encounter (Signed)
-----   Message from Carlean Jews, [facility] sent at 08/14/2017  9:26 AM EDT ----- Hi Dr. Caryl Bis,   As I'm sure you know, there is no direction offered from package inserts regarding equivalent doses or switching strategies. In the trials for lyrica, those on gabapentin on baseline had a 1 week washout period.   From my literature search, there seems to be a couple of options, all of which report no ADE that required study discontinuation. Citations below.       1. Stop Gabapentin and start lyrica at the next dosing interval (overnight switch)      2. Give 50% of gabapentin dose and 50% intended lyrica dose x4 days, then d/c gabapentin and increase lyrica to intended dose      3. Taper off gabapentin over a minimum of one week, then start lyrica and taper upward.   My vote is either for approach #1 or #2 to avoid pain for the patient. The doses I would suggest based on the literature and her renal function are below:        1. Stop gabapentin after tonight's dose, start lyrica at 150 mg BID at next scheduled gabapentin dose (over night switch)       2. Give gabapentin 300 mg in the morning and 600 mg at night + lyrica 75 mg BID x 4 days, then d/c gabapentin and increase lyrica as tolerate to 150 mg BID.    https://www.[place].nhs.uk/wp-content/uploads/2013/04/UKMi-Switching-between-Pregabalin-and-Gabapentin-for-neuropathic-pain.pdf  https://ol[facility].ie/wp-content/uploads/2014/09/What-conversion-ratio-is-recommended-between-pregabalin-and-gabapentin.pdf  Chrys Racer   ----- Message ----- From: Leone Haven, MD Sent: 08/13/2017   6:05 PM To: Carlean Jews, [facility]  Lind Covert,   I saw this patient today and she is interested in coming off of her gabapentin. She takes this for neuropathy and is interested in going on lyrica. She currently takes 900 mg in the morning and mid day and 1200 mg at night. I wanted to see if you could guide me to the best way to taper this. Thanks for your  help.  Randall Hiss

## 2017-08-15 NOTE — Telephone Encounter (Signed)
Please let the patient know that I heard from the pharmacist. We have come up with a plan to switch from gabapentin to Lyrica if she would still like to do this. We can give directions and send in Lyrica if she wants to proceed. Thanks.

## 2017-08-16 NOTE — Telephone Encounter (Signed)
Left message to return call 

## 2017-08-22 ENCOUNTER — Ambulatory Visit
Admission: RE | Admit: 2017-08-22 | Discharge: 2017-08-22 | Disposition: A | Discharge status: Home / Self Care | Payer: Medicare HMO | Source: Ambulatory Visit | Attending: Family Medicine | Admitting: Family Medicine

## 2017-08-22 ENCOUNTER — Other Ambulatory Visit (INDEPENDENT_AMBULATORY_CARE_PROVIDER_SITE_OTHER): Payer: Medicare HMO

## 2017-08-22 DIAGNOSIS — E876 Hypokalemia: Secondary | ICD-10-CM

## 2017-08-22 DIAGNOSIS — R222 Localized swelling, mass and lump, trunk: Secondary | ICD-10-CM | POA: Insufficient documentation

## 2017-08-22 LAB — POTASSIUM: POTASSIUM: 3.2 meq/L — AB (ref 3.5–5.1)

## 2017-08-22 NOTE — Telephone Encounter (Signed)
Please advise 

## 2017-08-22 NOTE — Telephone Encounter (Signed)
Pt has been notified and verbalized understanding. Pt would like prescription sent to the [facility] on file. Carolee Rota gave copy of medication instructions to pt.

## 2017-08-22 NOTE — Telephone Encounter (Signed)
Patient is coming in for lab today, could you please relay message as I have been unable to reach patient. Thank you

## 2017-08-23 ENCOUNTER — Other Ambulatory Visit: Payer: Self-pay | Admitting: Family Medicine

## 2017-08-23 MED ORDER — PREGABALIN 75 MG PO CAPS: 75.0000 mg | ORAL_CAPSULE | Freq: Two times a day (BID) | ORAL | 0 refills | Status: DC

## 2017-08-23 MED ORDER — POTASSIUM CHLORIDE CRYS ER 20 MEQ PO TBCR: 40.0000 meq | EXTENDED_RELEASE_TABLET | Freq: Two times a day (BID) | ORAL | 0 refills | Status: DC

## 2017-08-23 NOTE — Telephone Encounter (Signed)
Script faxed for Lyrica

## 2017-08-29 ENCOUNTER — Ambulatory Visit: Admission: RE | Admit: 2017-08-29 | Payer: Medicare HMO | Source: Ambulatory Visit

## 2017-08-29 ENCOUNTER — Telehealth: Payer: Self-pay | Admitting: Radiology

## 2017-08-29 DIAGNOSIS — E876 Hypokalemia: Secondary | ICD-10-CM

## 2017-08-29 NOTE — Telephone Encounter (Signed)
Ordered

## 2017-08-29 NOTE — Telephone Encounter (Signed)
Pt coming in for labs tomorrow, please place future orders. Thank you.  

## 2017-08-29 NOTE — Addendum Note (Signed)
Addended by: Leone Haven on: 08/29/2017 05:17 PM   Modules accepted: Orders

## 2017-08-30 ENCOUNTER — Other Ambulatory Visit: Payer: Medicare HMO

## 2017-09-03 ENCOUNTER — Ambulatory Visit
Admission: RE | Admit: 2017-09-03 | Discharge: 2017-09-03 | Disposition: A | Discharge status: Home / Self Care | Payer: Medicare HMO | Source: Ambulatory Visit | Attending: Pain Medicine | Admitting: Pain Medicine

## 2017-09-03 ENCOUNTER — Ambulatory Visit (HOSPITAL_BASED_OUTPATIENT_CLINIC_OR_DEPARTMENT_OTHER): Payer: Medicare HMO | Admitting: Pain Medicine

## 2017-09-03 ENCOUNTER — Encounter: Payer: Self-pay | Admitting: Pain Medicine

## 2017-09-03 VITALS — BP 163/97 | HR 75 | Temp 98.1°F | Resp 16 | Ht 73.0 in | Wt 200.0 lb

## 2017-09-03 DIAGNOSIS — M9983 Other biomechanical lesions of lumbar region: Secondary | ICD-10-CM | POA: Diagnosis present

## 2017-09-03 DIAGNOSIS — M5416 Radiculopathy, lumbar region: Secondary | ICD-10-CM

## 2017-09-03 DIAGNOSIS — G8918 Other acute postprocedural pain: Secondary | ICD-10-CM

## 2017-09-03 DIAGNOSIS — G8929 Other chronic pain: Secondary | ICD-10-CM

## 2017-09-03 DIAGNOSIS — M79605 Pain in left leg: Secondary | ICD-10-CM | POA: Insufficient documentation

## 2017-09-03 DIAGNOSIS — M961 Postlaminectomy syndrome, not elsewhere classified: Secondary | ICD-10-CM | POA: Diagnosis not present

## 2017-09-03 DIAGNOSIS — M545 Low back pain: Secondary | ICD-10-CM

## 2017-09-03 DIAGNOSIS — M5442 Lumbago with sciatica, left side: Secondary | ICD-10-CM

## 2017-09-03 DIAGNOSIS — M48061 Spinal stenosis, lumbar region without neurogenic claudication: Secondary | ICD-10-CM | POA: Insufficient documentation

## 2017-09-03 DIAGNOSIS — L299 Pruritus, unspecified: Secondary | ICD-10-CM

## 2017-09-03 MED ORDER — FENTANYL CITRATE (PF) 100 MCG/2ML IJ SOLN
25.0000 ug | INTRAMUSCULAR | Status: DC | PRN
Start: 2017-09-03 — End: 2017-09-03

## 2017-09-03 MED ORDER — DEXAMETHASONE SODIUM PHOSPHATE 10 MG/ML IJ SOLN
10.0000 mg | Freq: Once | INTRAMUSCULAR | Status: AC
  Administered 2017-09-03: 10 mg
  Filled 2017-09-03: qty 1

## 2017-09-03 MED ORDER — LACTATED RINGERS IV SOLN
1000.0000 mL | Freq: Once | INTRAVENOUS | Status: AC
  Administered 2017-09-03: 1000 mL via INTRAVENOUS

## 2017-09-03 MED ORDER — SODIUM CHLORIDE 0.9% FLUSH
2.0000 mL | Freq: Once | INTRAVENOUS | Status: AC
  Administered 2017-09-03: 1 mL

## 2017-09-03 MED ORDER — LIDOCAINE HCL 2 % IJ SOLN
10.0000 mL | Freq: Once | INTRAMUSCULAR | Status: DC
  Filled 2017-09-03: qty 20

## 2017-09-03 MED ORDER — TRIAMCINOLONE ACETONIDE 40 MG/ML IJ SUSP
40.0000 mg | Freq: Once | INTRAMUSCULAR | Status: AC
  Administered 2017-09-03: 40 mg
  Filled 2017-09-03: qty 1

## 2017-09-03 MED ORDER — IOPAMIDOL (ISOVUE-M 200) INJECTION 41%
10.0000 mL | Freq: Once | INTRAMUSCULAR | Status: DC
  Filled 2017-09-03: qty 10

## 2017-09-03 MED ORDER — MIDAZOLAM HCL 5 MG/5ML IJ SOLN
1.0000 mg | INTRAMUSCULAR | Status: DC | PRN
  Administered 2017-09-03: 2 mg via INTRAVENOUS
  Filled 2017-09-03: qty 5

## 2017-09-03 MED ORDER — SODIUM CHLORIDE 0.9 % IJ SOLN
INTRAMUSCULAR | Status: AC
  Filled 2017-09-03: qty 10

## 2017-09-03 MED ORDER — DIPHENHYDRAMINE HCL 50 MG/ML IJ SOLN
25.0000 mg | Freq: Once | INTRAMUSCULAR | Status: AC
  Administered 2017-09-03: 25 mg via INTRAMUSCULAR

## 2017-09-03 MED ORDER — DIPHENHYDRAMINE HCL 50 MG/ML IJ SOLN
INTRAMUSCULAR | Status: AC
  Filled 2017-09-03: qty 1

## 2017-09-03 MED ORDER — ROPIVACAINE HCL 2 MG/ML IJ SOLN
2.0000 mL | Freq: Once | INTRAMUSCULAR | Status: AC
  Administered 2017-09-03: 2 mL via EPIDURAL
  Filled 2017-09-03: qty 10

## 2017-09-03 MED ORDER — DEXAMETHASONE SODIUM PHOSPHATE 10 MG/ML IJ SOLN
INTRAMUSCULAR | Status: AC
  Filled 2017-09-03: qty 1

## 2017-09-03 MED ORDER — IOPAMIDOL (ISOVUE-M 200) INJECTION 41%
10.0000 mL | Freq: Once | INTRAMUSCULAR | Status: AC
  Administered 2017-09-03: 10 mL via EPIDURAL
  Filled 2017-09-03: qty 10

## 2017-09-03 MED ORDER — SODIUM CHLORIDE 0.9% FLUSH
1.0000 mL | Freq: Once | INTRAVENOUS | Status: AC
  Administered 2017-09-03: 2 mL

## 2017-09-03 MED ORDER — ROPIVACAINE HCL 2 MG/ML IJ SOLN
1.0000 mL | Freq: Once | INTRAMUSCULAR | Status: AC
  Administered 2017-09-03: 1 mL via EPIDURAL

## 2017-09-03 MED ORDER — LIDOCAINE HCL 2 % IJ SOLN
10.0000 mL | Freq: Once | INTRAMUSCULAR | Status: AC
  Administered 2017-09-03: 400 mg
  Filled 2017-09-03: qty 20

## 2017-09-03 NOTE — Progress Notes (Signed)
Patient's Name: Maria Hamilton  MRN: [id number]  Referring Provider: Glori Luis, MD  DOB: [birth year]  PCP: Glori Luis, MD  DOS: 09/03/2017  Note by: Oswaldo Done, MD  Service setting: Ambulatory outpatient  Specialty: Interventional Pain Management  Patient type: Established  Location: [facility] (AMB) Pain Management Facility  Visit type: Interventional Procedure   Primary Reason for Visit: Interventional Pain Management Treatment. CC: Back Pain (lower)  Procedure:  Anesthesia, Analgesia, Anxiolysis:  Procedure #1: Type: Therapeutic Inter-Laminar Epidural Steroid Injection Region: Lumbar Level: L2-3 Level. Laterality: Left Paramedial  Procedure #2: Type: Therapeutic Trans-Foraminal Epidural Steroid Injection Region: Lumbar Level: L2 Paravertebral Laterality: Left Paravertebral Position: Prone  Type: Local Anesthesia with Moderate (Conscious) Sedation Local Anesthetic: Lidocaine 1% Route: Intravenous (IV) IV Access: Secured Sedation: Meaningful verbal contact was maintained at all times during the procedure  Indication(s): Analgesia and Anxiety  Indications: 1. 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)   2. Chronic low back pain (Location of Primary Source of Pain) (Left)   3. Chronic lower extremity pain (Location of Secondary source of pain) (Left)   4. Chronic lumbar radicular pain (L5/S1 dermatome) (Location of Secondary source of pain) (Left)   5. Failed back surgical syndrome (x 3)    Pain Score: Pre-procedure: 7 /10 Post-procedure: 0-No pain/10  Pre-op Assessment:  Maria Hamilton is a 66 y.o. (year old), female patient, seen today for interventional treatment. She  has a past surgical history that includes Breast surgery; Back surgery; Ectopic pregnancy surgery; Hip surgery; Abdominal hysterectomy; cyst removal from wrist; and Cholecystectomy (N/A, 10/16/2016). Maria Hamilton has a current medication list which includes the following  prescription(s): amlodipine, escitalopram, gabapentin, potassium chloride sa, tizanidine, and tramadol, and the following Facility-Administered Medications: fentanyl, iopamidol, lidocaine, and midazolam. Her primarily concern today is the Back Pain (lower)  Initial Vital Signs: Blood pressure (!) 137/97, pulse 75, temperature 98.1 F (36.7 C), temperature source Oral, resp. rate 18, height 6\' 1"  (1.854 m), weight 200 lb (90.7 kg), SpO2 100 %. BMI: Estimated body mass index is 26.39 kg/m as calculated from the following:   Height as of this encounter: 6\' 1"  (1.854 m).   Weight as of this encounter: 200 lb (90.7 kg).  Risk Assessment: Allergies: Reviewed. She is allergic to ace inhibitors; ciprofloxacin; fentanyl; morphine and related; penicillin g benzathine; and tape.  Allergy Precautions: None required Coagulopathies: Reviewed. None identified.  Blood-thinner therapy: None at this time Active Infection(s): Reviewed. None identified. Maria Hamilton is afebrile  Site Confirmation: Maria Hamilton was asked to confirm the procedure and laterality before marking the site Procedure checklist: Completed Consent: Before the procedure and under the influence of no sedative(s), amnesic(s), or anxiolytics, the patient was informed of the treatment options, risks and possible complications. To fulfill our ethical and legal obligations, as recommended by the American Medical Association's Code of Ethics, I have informed the patient of my clinical impression; the nature and purpose of the treatment or procedure; the risks, benefits, and possible complications of the intervention; the alternatives, including doing nothing; the risk(s) and benefit(s) of the alternative treatment(s) or procedure(s); and the risk(s) and benefit(s) of doing nothing. The patient was provided information about the general risks and possible complications associated with the procedure. These may include, but are not limited to: failure to  achieve desired goals, infection, bleeding, organ or nerve damage, allergic reactions, paralysis, and death. In addition, the patient was informed of those risks and complications associated to Spine-related procedures, such as failure  to decrease pain; infection (i.e.: Meningitis, epidural or intraspinal abscess); bleeding (i.e.: epidural hematoma, subarachnoid hemorrhage, or any other type of intraspinal or peri-dural bleeding); organ or nerve damage (i.e.: Any type of peripheral nerve, nerve root, or spinal cord injury) with subsequent damage to sensory, motor, and/or autonomic systems, resulting in permanent pain, numbness, and/or weakness of one or several areas of the body; allergic reactions; (i.e.: anaphylactic reaction); and/or death. Furthermore, the patient was informed of those risks and complications associated with the medications. These include, but are not limited to: allergic reactions (i.e.: anaphylactic or anaphylactoid reaction(s)); adrenal axis suppression; blood sugar elevation that in diabetics may result in ketoacidosis or comma; water retention that in patients with history of congestive heart failure may result in shortness of breath, pulmonary edema, and decompensation with resultant heart failure; weight gain; swelling or edema; medication-induced neural toxicity; particulate matter embolism and blood vessel occlusion with resultant organ, and/or nervous system infarction; and/or aseptic necrosis of one or more joints. Finally, the patient was informed that Medicine is not an exact science; therefore, there is also the possibility of unforeseen or unpredictable risks and/or possible complications that may result in a catastrophic outcome. The patient indicated having understood very clearly. We have given the patient no guarantees and we have made no promises. Enough time was given to the patient to ask questions, all of which were answered to the patient's satisfaction. [name]  has indicated that she wanted to continue with the procedure. Attestation: I, the ordering provider, attest that I have discussed with the patient the benefits, risks, side-effects, alternatives, likelihood of achieving goals, and potential problems during recovery for the procedure that I have provided informed consent. Date: 09/03/2017; Time: 10:27 AM  Pre-Procedure Preparation:  Monitoring: As per clinic protocol. Respiration, ETCO2, SpO2, BP, heart rate and rhythm monitor placed and checked for adequate function Safety Precautions: Patient was assessed for positional comfort and pressure points before starting the procedure. Time-out: I initiated and conducted the "Time-out" before starting the procedure, as per protocol. The patient was asked to participate by confirming the accuracy of the "Time Out" information. Verification of the correct person, site, and procedure were performed and confirmed by me, the nursing staff, and the patient. "Time-out" conducted as per Joint Commission's Universal Protocol (UP.01.01.01). "Time-out" Date & Time: 09/03/2017; 1100 hrs.  Description of Procedure #1 Process:   Target Area: The  interlaminar space, initially targeting the lower border of the superior vertebral body lamina. Approach: Posterior paramedial approach. Area Prepped: Entire Posterior Lumbosacral Region Prepping solution: ChloraPrep (2% chlorhexidine gluconate and 70% isopropyl alcohol) Safety Precautions: Aspiration looking for blood return was conducted prior to all injections. At no point did we inject any substances, as a needle was being advanced. No attempts were made at seeking any paresthesias. Safe injection practices and needle disposal techniques used. Medications properly checked for expiration dates. SDV (single dose vial) medications used. Description of the Procedure: Protocol guidelines were followed. The patient was placed in position over the fluoroscopy table. The target area  was identified and the area prepped in the usual manner. Skin desensitized using vapocoolant spray. Skin & deeper tissues infiltrated with local anesthetic. Appropriate amount of time allowed to pass for local anesthetics to take effect. The procedure needle was introduced through the skin, ipsilateral to the reported pain, and advanced to the target area. Bone was contacted and the needle walked caudad, until the lamina was cleared. The ligamentum flavum was engaged and loss-of-resistance technique used as  the epidural needle was advanced. The epidural space was identified using "loss-of-resistance technique" with 2-3 ml of PF-NaCl (0.9% NSS), in a 5cc LOR glass syringe. Proper needle placement secured. Negative aspiration confirmed. Solution injected in intermittent fashion, asking for systemic symptoms every 0.5cc of injectate. The needles were then removed and the area cleansed, making sure to leave some of the prepping solution back to take advantage of its long term bactericidal properties. Start Time: 1100 hrs. Materials:  Needle(s) Type: Epidural needle Gauge: 17G Length: 3.5-in Medication(s): We administered lactated ringers, midazolam, triamcinolone acetonide, ropivacaine (PF) 2 mg/mL (0.2%), sodium chloride flush, lidocaine, iopamidol, dexamethasone, ropivacaine (PF) 2 mg/mL (0.2%), and sodium chloride flush. Please see chart orders for dosing details.  Description of Procedure #2 Process:   Target Area: The inferior and lateral portion of the pedicle, just lateral to a line created by the 6:00 position of the pedicle and the superior articular process of the vertebral body below. On the lateral view, this target lies just posterior to the anterior aspect of the lamina and posterior to the midpoint created between the anterior and the posterior aspect of the neural foramina. Approach: Posterior paravertebral approach. Area Prepped: Same as above Prepping solution: Same as above Safety  Precautions: Same as above Description of the Procedure: Protocol guidelines were followed. The patient was placed in position over the fluoroscopy table. The target area was identified and the area prepped in the usual manner. Skin desensitized using vapocoolant spray. Skin & deeper tissues infiltrated with local anesthetic. Appropriate amount of time allowed to pass for local anesthetics to take effect. The procedure needles were then advanced to the target area. Proper needle placement secured. Negative aspiration confirmed. Solution injected in intermittent fashion, asking for systemic symptoms every 0.2cc of injectate. The needles were then removed and the area cleansed, making sure to leave some of the prepping solution back to take advantage of its long term bactericidal properties. Vitals:   09/03/17 1119 09/03/17 1124 09/03/17 1127 09/03/17 1134  BP: (!) 164/103 (!) 169/87 (!) 160/91 (!) 163/97  Pulse:      Resp: 10  17 16   Temp:      TempSrc:      SpO2: 100%  100% 100%  Weight:      Height:        End Time: 1109 hrs. Materials:  Needle(s) Type: Regular needle Gauge: 22G Length: 3.5-in Medication(s): We administered lactated ringers, midazolam, triamcinolone acetonide, ropivacaine (PF) 2 mg/mL (0.2%), sodium chloride flush, lidocaine, iopamidol, dexamethasone, ropivacaine (PF) 2 mg/mL (0.2%), and sodium chloride flush. Please see chart orders for dosing details.  Imaging Guidance (Spinal):  Type of Imaging Technique: Fluoroscopy Guidance (Spinal) Indication(s): Assistance in needle guidance and placement for procedures requiring needle placement in or near specific anatomical locations not easily accessible without such assistance. Exposure Time: Please see nurses notes. Contrast: Before injecting any contrast, we confirmed that the patient did not have an allergy to iodine, shellfish, or radiological contrast. Once satisfactory needle placement was completed at the desired level,  radiological contrast was injected. Contrast injected under live fluoroscopy. No contrast complications. See chart for type and volume of contrast used. Fluoroscopic Guidance: I was personally present during the use of fluoroscopy. "Tunnel Vision Technique" used to obtain the best possible view of the target area. Parallax error corrected before commencing the procedure. "Direction-depth-direction" technique used to introduce the needle under continuous pulsed fluoroscopy. Once target was reached, antero-posterior, oblique, and lateral fluoroscopic projection used confirm needle placement in  all planes. Images permanently stored in EMR. Interpretation: I personally interpreted the imaging intraoperatively. Adequate needle placement confirmed in multiple planes. Appropriate spread of contrast into desired area was observed. No evidence of afferent or efferent intravascular uptake. No intrathecal or subarachnoid spread observed. Permanent images saved into the patient's record.  Antibiotic Prophylaxis:  Indication(s): None identified Antibiotic given: None  Post-operative Assessment:  EBL: None Complications: No immediate post-treatment complications observed by team, or reported by patient. Note: The patient tolerated the entire procedure well. A repeat set of vitals were taken after the procedure and the patient was kept under observation following institutional policy, for this type of procedure. Post-procedural neurological assessment was performed, showing return to baseline, prior to discharge. The patient was provided with post-procedure discharge instructions, including a section on how to identify potential problems. Should any problems arise concerning this procedure, the patient was given instructions to immediately contact us, at any time, without hesitation. In any case, we plan to contact the patient by telephone for a follow-up status report regarding this interventional procedure. Comments:   No additional relevant information.  Plan of Care  Disposition: Discharge home  Discharge Date & Time: 09/03/2017; 1144 hrs.   Physician-requested Follow-up:  Return for post-procedure eval by Dr. Laban Emperor in 2 weeks.  Future Appointments Date Time Provider Department Center  09/04/2017 2:00 PM [facility] None  09/30/2017 10:45 AM Barbette Merino, NP [facility] None   Possible POC:  The last time this patient had this procedure done was on 03/20/2017. He seemed to have provided the patient with excellent relief for close to 5 months. In view of this, we will at this procedure to her when necessary options and if the pain returns we'll simply do a palliative Left L2-3 interlaminar lumbar epidural steroid injection + left L2-3 transforaminal epidural steroid injection under fluoroscopic guidance and IV sedation.     Imaging Orders     DG C-Arm 1-60 Min-No Report  Procedure Orders     Lumbar Epidural Injection     Lumbar Transforaminal Epidural  Medications ordered for procedure: Meds ordered this encounter  Medications  . lactated ringers infusion 1,000 mL  . midazolam (VERSED) 5 MG/5ML injection 1-2 mg    Make sure Flumazenil is available in the pyxis when using this medication. If oversedation occurs, administer 0.2 mg IV over 15 sec. If after 45 sec no response, administer 0.2 mg again over 1 min; may repeat at 1 min intervals; not to exceed 4 doses (1 mg)  . fentaNYL (SUBLIMAZE) injection 25-50 mcg    Make sure Narcan is available in the pyxis when using this medication. In the event of respiratory depression (RR< 8/min): Titrate NARCAN (naloxone) in increments of 0.1 to 0.2 mg IV at 2-3 minute intervals, until desired degree of reversal.  . iopamidol (ISOVUE-M) 41 % intrathecal injection 10 mL  . triamcinolone acetonide (KENALOG-40) injection 40 mg  . ropivacaine (PF) 2 mg/mL (0.2%) (NAROPIN) injection 2 mL  . sodium chloride flush (NS) 0.9 % injection 2 mL  .  lidocaine (XYLOCAINE) 2 % (with pres) injection 200 mg  . lidocaine (XYLOCAINE) 2 % (with pres) injection 200 mg  . iopamidol (ISOVUE-M) 41 % intrathecal injection 10 mL  . dexamethasone (DECADRON) injection 10 mg  . ropivacaine (PF) 2 mg/mL (0.2%) (NAROPIN) injection 1 mL  . sodium chloride flush (NS) 0.9 % injection 1 mL   Medications administered: We administered lactated ringers, midazolam, triamcinolone acetonide, ropivacaine (PF) 2 mg/mL (0.2%),  sodium chloride flush, lidocaine, iopamidol, dexamethasone, ropivacaine (PF) 2 mg/mL (0.2%), and sodium chloride flush.  See the medical record for exact dosing, route, and time of administration.  New Prescriptions   No medications on file   Primary Care Physician: Glori Luis, MD Location: [facility] Note by: Oswaldo Done, MD Date: 09/03/2017; Time: 12:33 PM  Disclaimer:  Medicine is not an exact science. The only guarantee in medicine is that nothing is guaranteed. It is important to note that the decision to proceed with this intervention was based on the information collected from the patient. The Data and conclusions were drawn from the patient's questionnaire, the interview, and the physical examination. Because the information was provided in large part by the patient, it cannot be guaranteed that it has not been purposely or unconsciously manipulated. Every effort has been made to obtain as much relevant data as possible for this evaluation. It is important to note that the conclusions that lead to this procedure are derived in large part from the available data. Always take into account that the treatment will also be dependent on availability of resources and existing treatment guidelines, considered by other Pain Management Practitioners as being common knowledge and practice, at the time of the intervention. For Medico-Legal purposes, it is also important to point out that variation in  procedural techniques and pharmacological choices are the acceptable norm. The indications, contraindications, technique, and results of the above procedure should only be interpreted and judged by a Board-Certified Interventional Pain Specialist with extensive familiarity and expertise in the same exact procedure and technique.

## 2017-09-03 NOTE — Addendum Note (Signed)
Addended by: Dewayne Shorter on: 09/03/2017 02:50 PM   Modules accepted: Orders

## 2017-09-03 NOTE — Patient Instructions (Addendum)
____________________________________________________________________________________________  Post-Procedure instructions Instructions:  Apply ice: Fill a plastic sandwich bag with crushed ice. Cover it with a small towel and apply to injection site. Apply for 15 minutes then remove x 15 minutes. Repeat sequence on day of procedure, until you go to bed. The purpose is to minimize swelling and discomfort after procedure.  Apply heat: Apply heat to procedure site starting the day following the procedure. The purpose is to treat any soreness and discomfort from the procedure.  Food intake: Start with clear liquids (like water) and advance to regular food, as tolerated.   Physical activities: Keep activities to a minimum for the first 8 hours after the procedure.   Driving: If you have received any sedation, you are not allowed to drive for 24 hours after your procedure.  Blood thinner: Restart your blood thinner 6 hours after your procedure. (Only for those taking blood thinners)  Insulin: As soon as you can eat, you may resume your normal dosing schedule. (Only for those taking insulin)  Infection prevention: Keep procedure site clean and dry.  Post-procedure Pain Diary: Extremely important that this be done correctly and accurately. Recorded information will be used to determine the next step in treatment.  Pain evaluated is that of treated area only. Do not include pain from an untreated area.  Complete every hour, on the hour, for the initial 8 hours. Set an alarm to help you do this part accurately.  Do not go to sleep and have it completed later. It will not be accurate.  Follow-up appointment: Keep your follow-up appointment after the procedure. Usually 2 weeks for most procedures. (6 weeks in the case of radiofrequency.) Bring you pain diary.  Expect:  From numbing medicine (AKA: Local Anesthetics): Numbness or decrease in pain.  Onset: Full effect within 15 minutes of  injected.  Duration: It will depend on the type of local anesthetic used. On the average, 1 to 8 hours.   From steroids: Decrease in swelling or inflammation. Once inflammation is improved, relief of the pain will follow.  Onset of benefits: Depends on the amount of swelling present. The more swelling, the longer it will take for the benefits to be seen. In some cases, up to 10 days.  Duration: Steroids will stay in the system x 2 weeks. Duration of benefits will depend on multiple posibilities including persistent irritating factors.  From procedure: Some discomfort is to be expected once the numbing medicine wears off. This should be minimal if ice and heat are applied as instructed. Call if:  You experience numbness and weakness that gets worse with time, as opposed to wearing off.  New onset bowel or bladder incontinence. (Spinal procedures only)  Emergency Numbers:  Durning business hours (Monday - Thursday, 8:00 AM - 4:00 PM) (Friday, 9:00 AM - 12:00 Noon): [phone]  After hours: [phone] ____________________________________________________________________________________________   Post-Procedure Pain Diary   Name: Date of Service Procedure      Time Period Pain Score Painful Area  Pre-procedure ____/10   Time Period Pain Score Area improved. Area not improved.  15 to 30 min post-procedure ____/10    1st hour after procedure ____/10    2nd hour after procedure ____/10    3rd hour after procedure ____/10    4th hour after procedure ____/10    5th hour after procedure ____/10    6th hour after procedure ____/10    7th hour after procedure ____/10    Time Period Pain Score Area   improved. Area not improved.  Note: From here on, always document your pain score 1st thing in the morning.  1st day after procedure ____/10    2nd day after procedure ____/10    3rd day after procedure ____/10    4th day after procedure ____/10    5th day after procedure ____/10     Time Period Pain Score Area improved. Area not improved.  10th day after procedure ____/10    20th day after procedure ____/10     Benefits Indicate for each set of activities if the procedure changes your ability to accomplish them.  Activity Worse No-Change Improved  Dressing, eating, walking, toileting, hygiene     Shopping, housekeeping, food preparation, community transportation     Range of motion of affected area      Your opinion Please indicate which statement best describes your impression of this treatment.  Statement (X)  Based on the results, I am encouraged.   Based on the results, I am disappointed.   I am not sure I have an opinion at this point.    Note: Make sure to complete and return this form to your physician, on your follow-up appointment. This information will be used to interpret the results. Failure to accurately complete, or to return this information, may result in less than optimal outcomes.  

## 2017-09-03 NOTE — Progress Notes (Signed)
Safety precautions to be maintained throughout the outpatient stay will include: orient to surroundings, keep bed in low position, maintain call bell within reach at all times, provide assistance with transfer out of bed and ambulation.  

## 2017-09-04 ENCOUNTER — Telehealth: Payer: Self-pay | Admitting: *Deleted

## 2017-09-04 ENCOUNTER — Other Ambulatory Visit: Payer: Medicare HMO

## 2017-09-04 NOTE — Telephone Encounter (Signed)
Spoke with patient, states she has felt very dizzy since having procedure.  Explained to patient that after sedation she could have these affects and that she should be very careful getting up and down and ambulating.  States that she is feeling better today.  Reports eating and drinking well, denies any headache.  Checked her BP and reports 140/90.  Patient will call us if she does not continue to improve.

## 2017-09-11 ENCOUNTER — Telehealth: Payer: Self-pay | Admitting: Family Medicine

## 2017-09-11 DIAGNOSIS — R61 Generalized hyperhidrosis: Secondary | ICD-10-CM

## 2017-09-11 NOTE — Telephone Encounter (Signed)
Noted patient had not had her CT scan done previously. Called and spoke with her regarding this. She notes she was not informed of this being scheduled. We'll get this rescheduled. She does note continued night sweats. We'll get CT neck and chest rescheduled. Given persistent night sweats we'll obtain CT abdomen and pelvis as well to fully evaluate for a cause.

## 2017-09-11 NOTE — Telephone Encounter (Signed)
lvm for pt to return my call for CT that has been rescheduled for 10/3 at 11:00 am. NPO after midnight. Must pick up prep kit one day prior. [id number]. [phone] to reschedule.

## 2017-09-13 ENCOUNTER — Encounter: Payer: Self-pay | Admitting: Family Medicine

## 2017-09-18 ENCOUNTER — Ambulatory Visit
Admission: RE | Admit: 2017-09-18 | Discharge: 2017-09-18 | Disposition: A | Discharge status: Home / Self Care | Payer: Medicare HMO | Source: Ambulatory Visit | Attending: Family Medicine | Admitting: Family Medicine

## 2017-09-18 DIAGNOSIS — Z9049 Acquired absence of other specified parts of digestive tract: Secondary | ICD-10-CM | POA: Insufficient documentation

## 2017-09-18 DIAGNOSIS — K449 Diaphragmatic hernia without obstruction or gangrene: Secondary | ICD-10-CM | POA: Diagnosis not present

## 2017-09-18 DIAGNOSIS — R61 Generalized hyperhidrosis: Secondary | ICD-10-CM | POA: Diagnosis not present

## 2017-09-18 DIAGNOSIS — Z90710 Acquired absence of both cervix and uterus: Secondary | ICD-10-CM | POA: Insufficient documentation

## 2017-09-18 DIAGNOSIS — Z96643 Presence of artificial hip joint, bilateral: Secondary | ICD-10-CM | POA: Diagnosis not present

## 2017-09-18 DIAGNOSIS — E041 Nontoxic single thyroid nodule: Secondary | ICD-10-CM | POA: Diagnosis not present

## 2017-09-18 DIAGNOSIS — R911 Solitary pulmonary nodule: Secondary | ICD-10-CM | POA: Diagnosis not present

## 2017-09-18 DIAGNOSIS — M47812 Spondylosis without myelopathy or radiculopathy, cervical region: Secondary | ICD-10-CM | POA: Insufficient documentation

## 2017-09-18 MED ORDER — IOPAMIDOL (ISOVUE-370) INJECTION 76%
100.0000 mL | Freq: Once | INTRAVENOUS | Status: AC | PRN
  Administered 2017-09-18: 100 mL via INTRAVENOUS

## 2017-09-19 ENCOUNTER — Telehealth: Payer: Self-pay | Admitting: *Deleted

## 2017-09-19 NOTE — Telephone Encounter (Signed)
Pt husband has requested test results Contact Dominica Severin [phone]

## 2017-09-19 NOTE — Telephone Encounter (Signed)
See result note.  

## 2017-09-21 ENCOUNTER — Other Ambulatory Visit: Payer: Self-pay | Admitting: Family Medicine

## 2017-09-21 DIAGNOSIS — E041 Nontoxic single thyroid nodule: Secondary | ICD-10-CM

## 2017-09-30 ENCOUNTER — Ambulatory Visit: Payer: Medicare HMO | Admitting: Pain Medicine

## 2017-09-30 ENCOUNTER — Ambulatory Visit: Payer: Medicare HMO | Admitting: Nurse Practitioner

## 2017-10-01 ENCOUNTER — Ambulatory Visit: Payer: Medicare HMO

## 2017-10-02 ENCOUNTER — Ambulatory Visit: Payer: Medicare HMO | Admitting: Nurse Practitioner

## 2017-10-09 ENCOUNTER — Encounter: Payer: Self-pay | Admitting: Nurse Practitioner

## 2017-10-09 ENCOUNTER — Other Ambulatory Visit: Payer: Self-pay | Admitting: Nurse Practitioner

## 2017-10-09 ENCOUNTER — Ambulatory Visit: Payer: Medicare HMO | Attending: Pain Medicine | Admitting: Nurse Practitioner

## 2017-10-09 VITALS — BP 150/119 | HR 83 | Temp 95.6°F | Resp 16 | Ht 73.0 in | Wt 198.0 lb

## 2017-10-09 DIAGNOSIS — G629 Polyneuropathy, unspecified: Secondary | ICD-10-CM | POA: Diagnosis not present

## 2017-10-09 DIAGNOSIS — T402X5A Adverse effect of other opioids, initial encounter: Secondary | ICD-10-CM | POA: Diagnosis not present

## 2017-10-09 DIAGNOSIS — M545 Low back pain: Secondary | ICD-10-CM | POA: Insufficient documentation

## 2017-10-09 DIAGNOSIS — M79605 Pain in left leg: Secondary | ICD-10-CM | POA: Diagnosis not present

## 2017-10-09 DIAGNOSIS — K219 Gastro-esophageal reflux disease without esophagitis: Secondary | ICD-10-CM | POA: Diagnosis not present

## 2017-10-09 DIAGNOSIS — F329 Major depressive disorder, single episode, unspecified: Secondary | ICD-10-CM | POA: Insufficient documentation

## 2017-10-09 DIAGNOSIS — Z8249 Family history of ischemic heart disease and other diseases of the circulatory system: Secondary | ICD-10-CM | POA: Insufficient documentation

## 2017-10-09 DIAGNOSIS — F419 Anxiety disorder, unspecified: Secondary | ICD-10-CM | POA: Insufficient documentation

## 2017-10-09 DIAGNOSIS — R208 Other disturbances of skin sensation: Secondary | ICD-10-CM | POA: Diagnosis not present

## 2017-10-09 DIAGNOSIS — Z981 Arthrodesis status: Secondary | ICD-10-CM | POA: Insufficient documentation

## 2017-10-09 DIAGNOSIS — M1288 Other specific arthropathies, not elsewhere classified, other specified site: Secondary | ICD-10-CM | POA: Diagnosis not present

## 2017-10-09 DIAGNOSIS — M7918 Myalgia, other site: Secondary | ICD-10-CM | POA: Diagnosis not present

## 2017-10-09 DIAGNOSIS — Z811 Family history of alcohol abuse and dependence: Secondary | ICD-10-CM | POA: Insufficient documentation

## 2017-10-09 DIAGNOSIS — G8929 Other chronic pain: Secondary | ICD-10-CM | POA: Diagnosis not present

## 2017-10-09 DIAGNOSIS — Z79899 Other long term (current) drug therapy: Secondary | ICD-10-CM | POA: Insufficient documentation

## 2017-10-09 DIAGNOSIS — Z888 Allergy status to other drugs, medicaments and biological substances status: Secondary | ICD-10-CM | POA: Insufficient documentation

## 2017-10-09 DIAGNOSIS — M961 Postlaminectomy syndrome, not elsewhere classified: Secondary | ICD-10-CM | POA: Insufficient documentation

## 2017-10-09 DIAGNOSIS — G2581 Restless legs syndrome: Secondary | ICD-10-CM | POA: Diagnosis not present

## 2017-10-09 DIAGNOSIS — E559 Vitamin D deficiency, unspecified: Secondary | ICD-10-CM | POA: Diagnosis not present

## 2017-10-09 DIAGNOSIS — Z91041 Radiographic dye allergy status: Secondary | ICD-10-CM | POA: Insufficient documentation

## 2017-10-09 DIAGNOSIS — Z880 Allergy status to penicillin: Secondary | ICD-10-CM | POA: Insufficient documentation

## 2017-10-09 DIAGNOSIS — Z5181 Encounter for therapeutic drug level monitoring: Secondary | ICD-10-CM | POA: Diagnosis not present

## 2017-10-09 DIAGNOSIS — Z96643 Presence of artificial hip joint, bilateral: Secondary | ICD-10-CM | POA: Insufficient documentation

## 2017-10-09 DIAGNOSIS — R200 Anesthesia of skin: Secondary | ICD-10-CM

## 2017-10-09 DIAGNOSIS — M792 Neuralgia and neuritis, unspecified: Secondary | ICD-10-CM

## 2017-10-09 DIAGNOSIS — M0589 Other rheumatoid arthritis with rheumatoid factor of multiple sites: Secondary | ICD-10-CM | POA: Insufficient documentation

## 2017-10-09 DIAGNOSIS — R1013 Epigastric pain: Secondary | ICD-10-CM | POA: Insufficient documentation

## 2017-10-09 DIAGNOSIS — M5416 Radiculopathy, lumbar region: Secondary | ICD-10-CM | POA: Diagnosis not present

## 2017-10-09 DIAGNOSIS — G894 Chronic pain syndrome: Secondary | ICD-10-CM

## 2017-10-09 DIAGNOSIS — B0229 Other postherpetic nervous system involvement: Secondary | ICD-10-CM | POA: Diagnosis not present

## 2017-10-09 DIAGNOSIS — G4700 Insomnia, unspecified: Secondary | ICD-10-CM | POA: Insufficient documentation

## 2017-10-09 DIAGNOSIS — R42 Dizziness and giddiness: Secondary | ICD-10-CM | POA: Diagnosis not present

## 2017-10-09 DIAGNOSIS — Z79891 Long term (current) use of opiate analgesic: Secondary | ICD-10-CM | POA: Insufficient documentation

## 2017-10-09 DIAGNOSIS — Z885 Allergy status to narcotic agent status: Secondary | ICD-10-CM | POA: Insufficient documentation

## 2017-10-09 DIAGNOSIS — E876 Hypokalemia: Secondary | ICD-10-CM | POA: Insufficient documentation

## 2017-10-09 DIAGNOSIS — M5442 Lumbago with sciatica, left side: Secondary | ICD-10-CM

## 2017-10-09 DIAGNOSIS — M48061 Spinal stenosis, lumbar region without neurogenic claudication: Secondary | ICD-10-CM | POA: Insufficient documentation

## 2017-10-09 DIAGNOSIS — Z881 Allergy status to other antibiotic agents status: Secondary | ICD-10-CM | POA: Insufficient documentation

## 2017-10-09 DIAGNOSIS — K5903 Drug induced constipation: Secondary | ICD-10-CM | POA: Diagnosis not present

## 2017-10-09 DIAGNOSIS — I10 Essential (primary) hypertension: Secondary | ICD-10-CM | POA: Insufficient documentation

## 2017-10-09 MED ORDER — TIZANIDINE HCL 2 MG PO CAPS: 2.0000 mg | ORAL_CAPSULE | Freq: Three times a day (TID) | ORAL | 5 refills | Status: DC | PRN

## 2017-10-09 MED ORDER — PRAMIPEXOLE DIHYDROCHLORIDE 0.5 MG PO TABS: 0.5000 mg | ORAL_TABLET | Freq: Three times a day (TID) | ORAL | 2 refills | Status: DC

## 2017-10-09 MED ORDER — GABAPENTIN 300 MG PO CAPS: 300.0000 mg | ORAL_CAPSULE | Freq: Four times a day (QID) | ORAL | 5 refills | Status: DC

## 2017-10-09 NOTE — Patient Instructions (Addendum)
____________________________________________________________________________________________  Pain Scale  Introduction: The pain score used by this practice is the Verbal Numerical Rating Scale (VNRS-11). This is an 11-point scale. It is for adults and children 10 years or older. There are significant differences in how the pain score is reported, used, and applied. Forget everything you learned in the past and learn this scoring system.  General Information: The scale should reflect your current level of pain. Unless you are specifically asked for the level of your worst pain, or your average pain. If you are asked for one of these two, then it should be understood that it is over the past 24 hours.  Basic Activities of Daily Living (ADL): Personal hygiene, dressing, eating, transferring, and using restroom.  Instructions: Most patients tend to report their level of pain as a combination of two factors, their physical pain and their psychosocial pain. This last one is also known as "suffering" and it is reflection of how physical pain affects you socially and psychologically. From now on, report them separately. From this point on, when asked to report your pain level, report only your physical pain. Use the following table for reference.  Pain Clinic Pain Levels (0-5/10)  Pain Level Score  Description  No Pain 0   Mild pain 1 Nagging, annoying, but does not interfere with basic activities of daily living (ADL). Patients are able to eat, bathe, get dressed, toileting (being able to get on and off the toilet and perform personal hygiene functions), transfer (move in and out of bed or a chair without assistance), and maintain continence (able to control bladder and bowel functions). Blood pressure and heart rate are unaffected. A normal heart rate for a healthy adult ranges from 60 to 100 bpm (beats per minute).   Mild to moderate pain 2 Noticeable and distracting. Impossible to hide from other  people. More frequent flare-ups. Still possible to adapt and function close to normal. It can be very annoying and may have occasional stronger flare-ups. With discipline, patients may get used to it and adapt.   Moderate pain 3 Interferes significantly with activities of daily living (ADL). It becomes difficult to feed, bathe, get dressed, get on and off the toilet or to perform personal hygiene functions. Difficult to get in and out of bed or a chair without assistance. Very distracting. With effort, it can be ignored when deeply involved in activities.   Moderately severe pain 4 Impossible to ignore for more than a few minutes. With effort, patients may still be able to manage work or participate in some social activities. Very difficult to concentrate. Signs of autonomic nervous system discharge are evident: dilated pupils (mydriasis); mild sweating (diaphoresis); sleep interference. Heart rate becomes elevated (>115 bpm). Diastolic blood pressure (lower number) rises above 100 mmHg. Patients find relief in laying down and not moving.   Severe pain 5 Intense and extremely unpleasant. Associated with frowning face and frequent crying. Pain overwhelms the senses.  Ability to do any activity or maintain social relationships becomes significantly limited. Conversation becomes difficult. Pacing back and forth is common, as getting into a comfortable position is nearly impossible. Pain wakes you up from deep sleep. Physical signs will be obvious: pupillary dilation; increased sweating; goosebumps; brisk reflexes; cold, clammy hands and feet; nausea, vomiting or dry heaves; loss of appetite; significant sleep disturbance with inability to fall asleep or to remain asleep. When persistent, significant weight loss is observed due to the complete loss of appetite and sleep deprivation.  Blood   pressure and heart rate becomes significantly elevated. Caution: If elevated blood pressure triggers a pounding headache  associated with blurred vision, then the patient should immediately seek attention at an urgent or emergency care unit, as these may be signs of an impending stroke.    Emergency Department Pain Levels (6-10/10)  Emergency Room Pain 6 Severely limiting. Requires emergency care and should not be seen or managed at an outpatient pain management facility. Communication becomes difficult and requires great effort. Assistance to reach the emergency department may be required. Facial flushing and profuse sweating along with potentially dangerous increases in heart rate and blood pressure will be evident.   Distressing pain 7 Self-care is very difficult. Assistance is required to transport, or use restroom. Assistance to reach the emergency department will be required. Tasks requiring coordination, such as bathing and getting dressed become very difficult.   Disabling pain 8 Self-care is no longer possible. At this level, pain is disabling. The individual is unable to do even the most "basic" activities such as walking, eating, bathing, dressing, transferring to a bed, or toileting. Fine motor skills are lost. It is difficult to think clearly.   Incapacitating pain 9 Pain becomes incapacitating. Thought processing is no longer possible. Difficult to remember your own name. Control of movement and coordination are lost.   The worst pain imaginable 10 At this level, most patients pass out from pain. When this level is reached, collapse of the autonomic nervous system occurs, leading to a sudden drop in blood pressure and heart rate. This in turn results in a temporary and dramatic drop in blood flow to the brain, leading to a loss of consciousness. Fainting is one of the body's self defense mechanisms. Passing out puts the brain in a calmed state and causes it to shut down for a while, in order to begin the healing process.    Summary: 1. Refer to this scale when providing us with your pain level. 2. Be  accurate and careful when reporting your pain level. This will help with your care. 3. Over-reporting your pain level will lead to loss of credibility. 4. Even a level of 1/10 means that there is pain and will be treated at our facility. 5. High, inaccurate reporting will be documented as "Symptom Exaggeration", leading to loss of credibility and suspicions of possible secondary gains such as obtaining more narcotics, or wanting to appear disabled, for fraudulent reasons. 6. Only pain levels of 5 or below will be seen at our facility. 7. Pain levels of 6 and above will be sent to the Emergency Department and the appointment cancelled. ____________________________________________________________________________________________   ____________________________________________________________________________________________  Appointment Policy Summary  It is our goal and responsibility to provide the medical community with assistance in the evaluation and management of patients with chronic pain. Unfortunately our resources are limited. Because we do not have an unlimited amount of time, or available appointments, we are required to closely monitor and manage their use. The following rules exist to maximize their use:  Patient's responsibilities: 1. Punctuality:  At what time should I arrive? You should be physically present in our office 30 minutes before your scheduled appointment. Your scheduled appointment is with your assigned healthcare provider. However, it takes 5-10 minutes to be "checked-in", and another 15 minutes for the nurses to do the admission. If you arrive to our office at the time you were given for your appointment, you will end up being at least 20-25 minutes late to your appointment with the   provider. 2. Tardiness:  What happens if I arrive only a few minutes after my scheduled appointment time? You will need to reschedule your appointment. The cutoff is your appointment time.  This is why it is so important that you arrive at least 30 minutes before that appointment. If you have an appointment scheduled for 10:00 AM and you arrive at 10:01, you will be required to reschedule your appointment.  3. Plan ahead:  Always assume that you will encounter traffic on your way in. Plan for it. If you are dependent on a driver, make sure they understand these rules and the need to arrive early. 4. Other appointments and responsibilities:  Avoid scheduling any other appointments before or after your pain clinic appointments.  5. Be prepared:  Write down everything that you need to discuss with your healthcare provider and give this information to the admitting nurse. Write down the medications that you will need refilled. Bring your pills and bottles (even the empty ones), to all of your appointments, except for those where a procedure is scheduled. 6. No children or pets:  Find someone to take care of them. It is not appropriate to bring them in. 7. Scheduling changes:  We request "advanced notification" of any changes or cancellations. 8. Advanced notification:  Defined as a time period of more than 24 hours prior to the originally scheduled appointment. This allows for the appointment to be offered to other patients. 9. Rescheduling:  When a visit is rescheduled, it will require the cancellation of the original appointment. For this reason they both fall within the category of "Cancellations".  10. Cancellations:  They require advanced notification. Any cancellation less than 24 hours before the  appointment will be recorded as a "No Show". 11. No Show:  Defined as an unkept appointment where the patient failed to notify or declare to the practice their intention or inability to keep the appointment.  Corrective process for repeat offenders:  1. Tardiness: Three (3) episodes of rescheduling due to late arrivals will be recorded as one (1) "No Show". 2. Cancellation or  reschedule: Three (3) cancellations or rescheduling will be recorded as one (1) "No Show". 3. "No Shows": Three (3) "No Shows" within a 12 month period will result in discharge from the practice.  ____________________________________________________________________________________________   BMI interpretation table: BMI level Category Range association with higher incidence of chronic pain  <18 kg/m2 Underweight   18.5-24.9 kg/m2 Ideal body weight   25-29.9 kg/m2 Overweight Increased incidence by 20%  30-34.9 kg/m2 Obese (Class I) Increased incidence by 68%  35-39.9 kg/m2 Severe obesity (Class II) Increased incidence by 136%  >40 kg/m2 Extreme obesity (Class III) Increased incidence by 254%   BMI Readings from Last 4 Encounters:  10/09/17 26.12 kg/m  09/03/17 26.39 kg/m  08/13/17 26.57 kg/m  06/14/17 27.05 kg/m   Wt Readings from Last 4 Encounters:  10/09/17 198 lb (89.8 kg)  09/03/17 200 lb (90.7 kg)  08/13/17 201 lb 6.4 oz (91.4 kg)  06/14/17 205 lb (93 kg)  Lysis of Epidural Adhesions Utilizing the Epidural Approach (RACZ Epidural Adhesiolysis)  Introduction Bleeding into the epidural space following surgery or leakage of disc material following breakage or a tear of a disc most commonly causes epidural scarring.  Presumably, inflammation and compression of nerve roots by epidural scar (Adhesions) are the mechanism of persistent pain following back surgery, ruptured or herniated discs, or vertebral body fracture.  Epidural scar may also contribute to the pain of spinal column  metastatic carcinoma, failed facet joint syndrome, and unexplained neck or low back pain.   Diagnosis Conventional studies such as myelograms, computerized tomography (CT Scans), and magnetic resonance imaging (MRI), are usually inadequate to make the diagnosis.  Injection of contrast material (dye) into the epidural space yields an "epidurogram", which is diagnostic for the presence or absence of  epidural scar tissue.   Procedure Since further surgery would only produce more scar tissue, a different approach to the problem needs to be taken.  "Lysis of Epidural Adhesions" (Breakage of scar tissue) is an alternative to this problem.  The procedure consists of introducing an epidural catheter (thin plastic tube) into the epidural space (space between your spinal cord and the walls of the spinal canal, within your backbone).  Once in place, medications are injected through this tubing in order to break the scar tissue.  Although the catheter is placed within fifteen to twenty minutes, it is kept in the epidural space for a couple of hours.  During this time, the injection of medications through the tubing is performed.  At the end of the procedure the catheter is removed and the patient discharged to home.   Conditions Usually Treated with This Modality Conditions include, but are not limited to: Marland Kitchen Failed Back Surgery Syndrome . Post-Laminectomy Syndrome . Epidural Adhesions . Back and leg pain . Sciatica . Radiculopathy . Neck and arm pain . Leg pain . Disc disruption . Traumatic vertebral body compression fracture . Degenerative arthritis of the spine . Facet pain . Epidural scarring following infection . Occipital neuralgia . Others  Side Effects and Possible Complications Everything in medicine is subject to side effects and possible complications.  No two patients are alike.  Side effects and complications are not the same or equivalent to malpractice.  Malpractice refers to an injury sustained by a patient, which occurs as a consequence of negligence in the practice of medicine.  Side effects and complications, on the other hand, are untoward events, which can occur and may injure a patient, in certain percentages of the population.  These events can, and do occur even if everything goes according to plan, and in the absence of negligence or malpractice.  Possible side effects and  complications of this procedure include, but are not limited to. . Pain or worsening of symptoms . Infection (local = abscess; or generalized = sepsis), including meningitis and death. Infection due to a steroid-induced immune system suppression. . Bleeding, including hematomas, which might compress the spinal cord, therefore causing paralysis. . Nerve damage, including sensory or motor weakness, and/or paralysis. Nerve damage, ranging from minor nerve irritation with pain, to major nerve damage with paralysis, impotence, urinary incontinence, and/or fecal incontinence. . Allergic reactions ranging from a minor rush to an anaphylactic reaction and death . Failure to relieve pain . Spinal cord compression leading to paralysis . Breakage of catheter (tubing) . Unforeseen events:   Guarantees That It Will Help None. There are no guarantees in medicine.   Results of Treatment 93.9% of patients will experience some pain relief, which will be variable.  6.1% will experience no relief.     Duration of Results Only 12.3% will experience persistent pain relief beyond 12 months.  57.9% of female patients and 64.4% of female patients will experience between 0-3 months of variable degrees of pain relief.   Frequently Asked Questions  What is the incidence of complications? It is unknown.  Most complications occur sporadically and are reported as isolated case  reports.  Can the procedure be repeated when and if my pain returns? Yes.  The effectiveness of subsequent repeat procedures is also variable.  Some patients obtain longer duration of pain relief while others obtain shorter duration.  Does this require that I be hospitalized? No.  On the average the procedure takes anywhere from 1 to 3 hours to complete.  What should I expect to feel after the procedure? If the procedure is successful, you should experience pain relief of variable degrees.  Will I obtain complete (100%) pain  relief? Although possible, it is our experience that patients suffering from chronic pain, the cause of the pain is multifactorial, and therefore, highly unlikely to completely address the problem with only one type of therapy.  What is an Epidurolysis (RACZ) Procedure? Epidurolysis (RACZ) Procedure is used to dissolve some of the scar tissue from around entrapped nerves in the Epidural space of spine, so that medications such as cortisone can reach the affected areas. Dr. Kari Baars pioneered this procedure.   What causes scarring (adhesions)? Scarring is most commonly caused from bleeding into the Epidural space following back surgery and the subsequent healing process. It is a natural occurrence following surgical intervention. Sometimes scarring can also occur when a disk ruptures and its contents leak out.   What is the purpose of it? To allow medications to reach affected nerves so that pain and other symptoms may be diminished.   How long does the procedure take?  First, a small catheter (surgical tube) is inserted in the Epidural space up to the area where the scar is located. This is done under sterile conditions using x-ray guidance. This epidural catheter is secured to the skin using tape and the patient is transported to the recovery room. This part may take 45 minutes. While in the recovery area, series of medications will be injected via the catheter. This part may take 1.0 to 2.0 hours. The patient is kept in the recovery room for the duration of the procedure. Once all medications are injected, then the catheter is removed. The actual injections only take a few minutes, however, the procedure protocol calls for certain periods of time to elapse between the administration of medications.   What is actually injected? The injection consists of a mixture of a local anesthetic (numbing medicine), a steroid (anti-inflammatory medication), radiological contrast dye (to visualize the scar  tissue), hyaluronidase (medicine to soften the scar), and a hyper-concentrated solution sterile salt water (to remove fluid from the swollen nerves via osmosis).   Will the injection hurt? Patients receive intravenous sedation and local infiltration of numbing medicine to minimize the discomfort from the procedure. Because the procedure involves the insertion of a needle, some discomfort is to be expected. In addition, because there is scar tissue and swelling inside of the epidural space, as the medications are injected, the sensation of pressure should be expected.   Will I be "put out" for this procedure? No. This procedure is done under local anesthesia and sedation, which makes the procedure easy to tolerate. The amount of sedation given generally depends upon the patient's tolerance. Communication with the patients during the procedure is essential to avoid complications.   How is the procedure performed? Catheter placement is done with the patient lying on their stomach. During the procedure, the patient is monitored with a heart monitor, blood pressure monitor, and a blood oxygen monitoring device. The skin in the back is cleaned with antiseptic solution and then the procedure is  carried out. After the procedure, you are placed on your back or on your side. X-rays are used to assist in the placement of the catheter and to perform the epidurogram (injection of radiological contrast to confirm adequate placement).   What should I expect after the injection? Immediately after the injection, you may feel your legs slightly heavy and numb. Also, you may notice that your pain may improve or disappear. This may only last for a few hours and it is due to the local anesthetics (numbing medicine).    When can I return to work? Unless there are any complications, you should be able to return to work the next day. However, it may take a couple of days for you to get the full benefit of the procedure.    How long the effects of the medication last? The immediate effect is usually from the local anesthetic injected. This wears off in a few hours. The cortisone starts working in about 5 to 10 days and its effect can last for several days to a few months. The benefits from the procedure may last as long as 6 month.   How many times do I need to have this procedure performed? If the first procedure does not relieve your symptoms within one to two weeks, you may be recommended to have one more procedure. After this, we recommend 6 months between [facility] procedures.   Will the Epidurolysis (RACZ) Procedure help me? It is very difficult to predict if the procedure will indeed help you or not. Generally speaking, the patients who have recent scarring (e.g. following back surgery) respond better.    What are the risks and side effects? Generally speaking, this procedure is relatively safe. However, with any procedure there are risks and possible complications. These include, but are not limited to: spinal headaches; central nervous system infections such as meningitis, or epidural abscesses; epidural hematomas with possible spinal cord compression and subsequent permanent paralysis; worsening of symptoms; failure to provide relief; nerve damage; urinary or fecal incontinence; blood vessel damage, etc. Side effects related to the steroids, such as: weight gain; increase in blood sugar (mainly in diabetics); water retention; suppression of body's immune system; mood disturbances, etc.. Some of patients may have anaphylactic allergic reactions to hyaluronidase. Fortunately, the serious side effects and complications are uncommon.   Who should not have this procedure? If you are allergic to any of the medications to be injected, if you are on a blood thinning medication (e.i. Coumadin), or if you have an active infection going on, you should not have the injection. The following are absolute contraindications for  performing epidural adhesiolysis: 1. Sepsis 2. Chronic infection 3. Coagulopathy (Bleeding disorder) 4. Local infection at the procedure site 5. Patient refusal 6. Syrinx formation 7. A relative contraindication is the presence of arachnoiditis.  How do I prepare for the procedure? 1. Do not eat or drink for 6 hours before the procedure. 2. If you take blood pressure medication in the morning, take it as usual, with just a sip of water. 3. Take a shower the morning of the procedure using an antibacterial soap such as "Right Guard". 4. Take a 1000 mg Vitamin C every day, before and after the procedure to build up your immune system and decrease the risk of infections. 5. You may drink water up to 2 hours before the procedure. Empty your bladder before getting your IV started.Lysis of Epidural Adhesions Utilizing the Epidural Approach (RACZ Epidural Adhesiolysis)  Introduction Bleeding  into the epidural space following surgery or leakage of disc material following breakage or a tear of a disc most commonly causes epidural scarring.  Presumably, inflammation and compression of nerve roots by epidural scar (Adhesions) are the mechanism of persistent pain following back surgery, ruptured or herniated discs, or vertebral body fracture.  Epidural scar may also contribute to the pain of spinal column metastatic carcinoma, failed facet joint syndrome, and unexplained neck or low back pain.   Diagnosis Conventional studies such as myelograms, computerized tomography (CT Scans), and magnetic resonance imaging (MRI), are usually inadequate to make the diagnosis.  Injection of contrast material (dye) into the epidural space yields an "epidurogram", which is diagnostic for the presence or absence of epidural scar tissue.   Procedure Since further surgery would only produce more scar tissue, a different approach to the problem needs to be taken.  "Lysis of Epidural Adhesions" (Breakage of scar tissue) is  an alternative to this problem.  The procedure consists of introducing an epidural catheter (thin plastic tube) into the epidural space (space between your spinal cord and the walls of the spinal canal, within your backbone).  Once in place, medications are injected through this tubing in order to break the scar tissue.  Although the catheter is placed within fifteen to twenty minutes, it is kept in the epidural space for a couple of hours.  During this time, the injection of medications through the tubing is performed.  At the end of the procedure the catheter is removed and the patient discharged to home.   Conditions Usually Treated with This Modality Conditions include, but are not limited to: Failed Back Surgery Syndrome Post-Laminectomy Syndrome Epidural Adhesions Back and leg pain Sciatica Radiculopathy Neck and arm pain Leg pain Disc disruption Traumatic vertebral body compression fracture Degenerative arthritis of the spine Facet pain Epidural scarring following infection Occipital neuralgia Others  Side Effects and Possible Complications Everything in medicine is subject to side effects and possible complications.  No two patients are alike.  Side effects and complications are not the same or equivalent to malpractice.  Malpractice refers to an injury sustained by a patient, which occurs as a consequence of negligence in the practice of medicine.  Side effects and complications, on the other hand, are untoward events, which can occur and may injure a patient, in certain percentages of the population.  These events can, and do occur even if everything goes according to plan, and in the absence of negligence or malpractice.  Possible side effects and complications of this procedure include, but are not limited to. Pain or worsening of symptoms Infection (local = abscess; or generalized = sepsis), including meningitis and death. Infection due to a steroid-induced immune system  suppression. Bleeding, including hematomas, which might compress the spinal cord, therefore causing paralysis. Nerve damage, including sensory or motor weakness, and/or paralysis. Nerve damage, ranging from minor nerve irritation with pain, to major nerve damage with paralysis, impotence, urinary incontinence, and/or fecal incontinence. Allergic reactions ranging from a minor rush to an anaphylactic reaction and death Failure to relieve pain Spinal cord compression leading to paralysis Breakage of catheter (tubing) Unforeseen events:   Guarantees That It Will Help None. There are no guarantees in medicine.   Results of Treatment 93.9% of patients will experience some pain relief, which will be variable.  6.1% will experience no relief.     Duration of Results Only 12.3% will experience persistent pain relief beyond 12 months.  57.9% of female patients and  64.4% of female patients will experience between 0-3 months of variable degrees of pain relief.   Frequently Asked Questions  What is the incidence of complications? It is unknown.  Most complications occur sporadically and are reported as isolated case reports.  Can the procedure be repeated when and if my pain returns? Yes.  The effectiveness of subsequent repeat procedures is also variable.  Some patients obtain longer duration of pain relief while others obtain shorter duration.  Does this require that I be hospitalized? No.  On the average the procedure takes anywhere from 1 to 3 hours to complete.  What should I expect to feel after the procedure? If the procedure is successful, you should experience pain relief of variable degrees.  Will I obtain complete (100%) pain relief? Although possible, it is our experience that patients suffering from chronic pain, the cause of the pain is multifactorial, and therefore, highly unlikely to completely address the problem with only one type of therapy.  What is an Epidurolysis (RACZ)  Procedure? Epidurolysis (RACZ) Procedure is used to dissolve some of the scar tissue from around entrapped nerves in the Epidural space of spine, so that medications such as cortisone can reach the affected areas. Dr. Kari Baars pioneered this procedure.   What causes scarring (adhesions)? Scarring is most commonly caused from bleeding into the Epidural space following back surgery and the subsequent healing process. It is a natural occurrence following surgical intervention. Sometimes scarring can also occur when a disk ruptures and its contents leak out.   What is the purpose of it? To allow medications to reach affected nerves so that pain and other symptoms may be diminished.   How long does the procedure take?  First, a small catheter (surgical tube) is inserted in the Epidural space up to the area where the scar is located. This is done under sterile conditions using x-ray guidance. This epidural catheter is secured to the skin using tape and the patient is transported to the recovery room. This part may take 45 minutes. While in the recovery area, series of medications will be injected via the catheter. This part may take 1.0 to 2.0 hours. The patient is kept in the recovery room for the duration of the procedure. Once all medications are injected, then the catheter is removed. The actual injections only take a few minutes, however, the procedure protocol calls for certain periods of time to elapse between the administration of medications.   What is actually injected? The injection consists of a mixture of a local anesthetic (numbing medicine), a steroid (anti-inflammatory medication), radiological contrast dye (to visualize the scar tissue), hyaluronidase (medicine to soften the scar), and a hyper-concentrated solution sterile salt water (to remove fluid from the swollen nerves via osmosis).   Will the injection hurt? Patients receive intravenous sedation and local infiltration of numbing  medicine to minimize the discomfort from the procedure. Because the procedure involves the insertion of a needle, some discomfort is to be expected. In addition, because there is scar tissue and swelling inside of the epidural space, as the medications are injected, the sensation of pressure should be expected.   Will I be "put out" for this procedure? No. This procedure is done under local anesthesia and sedation, which makes the procedure easy to tolerate. The amount of sedation given generally depends upon the patient's tolerance. Communication with the patients during the procedure is essential to avoid complications.   How is the procedure performed? Catheter placement is done with  the patient lying on their stomach. During the procedure, the patient is monitored with a heart monitor, blood pressure monitor, and a blood oxygen monitoring device. The skin in the back is cleaned with antiseptic solution and then the procedure is carried out. After the procedure, you are placed on your back or on your side. X-rays are used to assist in the placement of the catheter and to perform the epidurogram (injection of radiological contrast to confirm adequate placement).   What should I expect after the injection? Immediately after the injection, you may feel your legs slightly heavy and numb. Also, you may notice that your pain may improve or disappear. This may only last for a few hours and it is due to the local anesthetics (numbing medicine).    When can I return to work? Unless there are any complications, you should be able to return to work the next day. However, it may take a couple of days for you to get the full benefit of the procedure.   How long the effects of the medication last? The immediate effect is usually from the local anesthetic injected. This wears off in a few hours. The cortisone starts working in about 5 to 10 days and its effect can last for several days to a few months. The  benefits from the procedure may last as long as 6 month.   How many times do I need to have this procedure performed? If the first procedure does not relieve your symptoms within one to two weeks, you may be recommended to have one more procedure. After this, we recommend 6 months between [facility] procedures.   Will the Epidurolysis (RACZ) Procedure help me? It is very difficult to predict if the procedure will indeed help you or not. Generally speaking, the patients who have recent scarring (e.g. following back surgery) respond better.    What are the risks and side effects? Generally speaking, this procedure is relatively safe. However, with any procedure there are risks and possible complications. These include, but are not limited to: spinal headaches; central nervous system infections such as meningitis, or epidural abscesses; epidural hematomas with possible spinal cord compression and subsequent permanent paralysis; worsening of symptoms; failure to provide relief; nerve damage; urinary or fecal incontinence; blood vessel damage, etc. Side effects related to the steroids, such as: weight gain; increase in blood sugar (mainly in diabetics); water retention; suppression of body's immune system; mood disturbances, etc.. Some of patients may have anaphylactic allergic reactions to hyaluronidase. Fortunately, the serious side effects and complications are uncommon.   Who should not have this procedure? If you are allergic to any of the medications to be injected, if you are on a blood thinning medication (e.i. Coumadin), or if you have an active infection going on, you should not have the injection. The following are absolute contraindications for performing epidural adhesiolysis: Sepsis Chronic infection Coagulopathy (Bleeding disorder) Local infection at the procedure site Patient refusal Syrinx formation A relative contraindication is the presence of arachnoiditis.  How do I prepare for the  procedure? Do not eat or drink for 6 hours before the procedure. If you take blood pressure medication in the morning, take it as usual, with just a sip of water. Take a shower the morning of the procedure using an antibacterial soap such as "Right Guard". Take a 1000 mg Vitamin C every day, before and after the procedure to build up your immune system and decrease the risk of infections. You may drink  water up to 2 hours before the procedure. 6. Empty your bladder before getting your IV started.

## 2017-10-09 NOTE — Progress Notes (Signed)
Patient's Name: Maria Hamilton  MRN: [id number]  Referring Provider: Glori Luis, MD  DOB: 1951/05/27  PCP: Glori Luis, MD  DOS: 10/09/2017  Note by: Barbette Merino NP  Service setting: Ambulatory outpatient  Specialty: Interventional Pain Management  Location: [facility] (AMB) Pain Management Facility    Patient type: Established    Primary Reason(s) for Visit: Encounter for prescription drug management & post-procedure evaluation of chronic illness with mild to moderate exacerbation(Level of risk: moderate) CC: Leg Pain (left) and Back Pain (left)  HPI  Maria Hamilton is a 66 y.o. year old, female patient, who comes today for a post-procedure evaluation and medication management. She has GERD (gastroesophageal reflux disease); Rheumatoid factor positive; Vitamin D deficiency; Insomnia; Fibromyalgia; Chronic low back pain (Location of Primary Source of Pain) (Left); Chronic pain syndrome; Depression; Hypertension; 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); Long term current use of opiate analgesic; Long term prescription opiate use; Opiate use; Encounter for therapeutic drug level monitoring; Encounter for pain management planning; Rheumatoid arthritis, multiple sites (positive RF); Chronic lower extremity pain (Location of Secondary source of pain) (Left); Lumbar facet arthropathy (Bilateral); Failed back surgical syndrome (x 3); Epidural fibrosis; Neurogenic pain; Musculoskeletal pain; Chronic lumbar radicular pain (L5/S1 dermatome) (Location of Secondary source of pain) (Left); History of total hip replacement, bilateral; Hypokalemia; Constipation; Therapeutic opioid-induced constipation (OIC); Palpitations; Epigastric pain; Memory difficulty; Polyneuropathy; Post herpetic neuralgia; Abnormal MRI, lumbar spine (03/06/2017); 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); History of lumbar fusion (L3-4, L4-5, and L5-S1); Vertigo; Chronic foot numbness (Left); Night  sweats; and Supraclavicular fossa fullness on her problem list. Her primarily concern today is the Leg Pain (left) and Back Pain (left)  Pain Assessment: Location: Left Back Radiating: into left leg down to the toes and patient states it feels like there is a wire around it causing numbness and decreased sensation.  Onset: More than a month ago Duration: Chronic pain Quality: Pins and needles, Numbness Severity: 8 /10 (self-reported pain score)  Note: Reported level is compatible with observation. Clinically the patient looks like a 5/10 Information on the proper use of the pain scale provided to the patient today. When using our objective Pain Scale, levels between 6 and 10/10 are said to belong in an emergency room, as it progressively worsens from a 6/10, described as severely limiting, requiring emergency care not usually available at an outpatient pain management facility. At a 6/10 level, communication becomes difficult and requires great effort. Assistance to reach the emergency department may be required. Facial flushing and profuse sweating along with potentially dangerous increases in heart rate and blood pressure will be evident. Effect on ADL: patient stays in bed most of the time.  Timing: Constant Modifying factors: patient states nothing helps at all.  states she rubs vicks salve and heating pad but that doesn't help for long  Maria Hamilton was last seen on Visit date not found for a procedure. During today's appointment we reviewed Maria Hamilton post-procedure results, as well as her outpatient medication regimen. She states that she has constant burning in her foot. She states it is worse at night. She is not able to get comfortable secondary to the pain. She wears a tight sock around her leg to try to help the pain. She desires to have her leg "cut off". She admits that she is not taking her medication, Tramadol. It is not effective, it make her feel bad. She admits that she has  allergies to other narcotics  and they are not effective. She denies the use of tens unit or recent PT. She is status post lumbar surgery and had some scar tissue. She admits that she has discussed the Racz procedure and SCS with Dr Shauna Hugh. She admits that she is willing to do whatever to help with the pain. She is having to stay in bed most of the time.   Further details on both, my assessment(s), as well as the proposed treatment plan, please see below.  Controlled Substance Pharmacotherapy Assessment REMS (Risk Evaluation and Mitigation Strategy)  Analgesic:Tramadol 100 mg 4 times a day (400 mg/dayof tramadol)  MME/day:40mg /day.  Vernie Ammons, RN  10/09/2017  9:09 AM  Sign at close encounter Nursing Pain Medication Assessment:  Safety precautions to be maintained throughout the outpatient stay will include: orient to surroundings, keep bed in low position, maintain call bell within reach at all times, provide assistance with transfer out of bed and ambulation.  Medication Inspection Compliance: Pill count conducted under aseptic conditions, in front of the patient. Neither the pills nor the bottle was removed from the patient's sight at any time. Once count was completed pills were immediately returned to the patient in their original bottle.  Medication: Tramadol (Ultram) Pill/Patch Count: 137 of 240 pills remain Pill/Patch Appearance: Markings consistent with prescribed medication Bottle Appearance: Standard pharmacy container. Clearly labeled. Filled Date: 02 / 22 / 2018 Last Medication intake:  patient does not take because they make her sick   Pharmacokinetics: Liberation and absorption (onset of action): WNL Distribution (time to peak effect): WNL Metabolism and excretion (duration of action): WNL         Pharmacodynamics: Desired effects: Analgesia: Maria Hamilton reports >50% benefit. Functional ability: Patient reports that medication allows her to accomplish basic  ADLs Clinically meaningful improvement in function (CMIF): Sustained CMIF goals met Perceived effectiveness: Described as relatively effective, allowing for increase in activities of daily living (ADL) Undesirable effects: Side-effects or Adverse reactions: None reported Monitoring: [place] PMP: Online review of the past 23-month period conducted. Compliant with practice rules and regulations Last UDS on record: Summary  Date Value Ref Range Status  09/24/2016 FINAL  Final    Comment:    ==================================================================== TOXAS[facility]RE COMP DRUG ANALYSIS,UR ==================================================================== Test                             Result       Flag       Units Drug Present and Declared for Prescription Verification   Hydrocodone                    1294         EXPECTED   ng/mg creat   Hydromorphone                  104          EXPECTED   ng/mg creat   Dihydrocodeine                 420          EXPECTED   ng/mg creat   Norhydrocodone                 >1142        EXPECTED   ng/mg creat    Sources of hydrocodone include scheduled prescription    medications. Hydromorphone, dihydrocodeine and norhydrocodone are    expected metabolites of hydrocodone. Hydromorphone and  dihydrocodeine are also available as scheduled prescription    medications.   Oxymorphone                    39           EXPECTED   ng/mg creat   Noroxymorphone                 19           EXPECTED   ng/mg creat    Sources of oxymorphone are scheduled prescription medications;    oxymorphone is also a metabolite of oxycodone. Noroxymorphone is    an expected metabolite of oxymorphone.   Citalopram                     PRESENT      EXPECTED   Desmethylcitalopram            PRESENT      EXPECTED    Desmethylcitalopram is an expected metabolite of citalopram or    the enantiomeric form, escitalopram.   Acetaminophen                  PRESENT      EXPECTED Drug Present  not Declared for Prescription Verification   Orphenadrine                   PRESENT      UNEXPECTED   Ibuprofen                      PRESENT      UNEXPECTED Drug Absent but Declared for Prescription Verification   Oxycodone                      Not Detected UNEXPECTED ng/mg creat    Oxycodone is almost always present in patients taking this drug    consistently.  Absence of oxycodone could be due to lapse of time    since the last dose or unusual pharmacokinetics (rapid    metabolism).   Hydroxyzine                    Not Detected UNEXPECTED ==================================================================== Test                      Result    Flag   Units      Ref Range   Creatinine              438              mg/dL      >=16 ==================================================================== Declared Medications:  The flagging and interpretation on this report are based on the  following declared medications.  Unexpected results may arise from  inaccuracies in the declared medications.  **Note: The testing scope of this panel includes these medications:  Citalopram (Lexapro)  Hydrocodone (Norco)  Hydroxyzine (Atarax)  Oxycodone (Percocet)  **Note: The testing scope of this panel does not include small to  moderate amounts of these reported medications:  Acetaminophen (Norco)  Acetaminophen (Percocet)  **Note: The testing scope of this panel does not include following  reported medications:  Amlodipine (Norvasc)  Hydrochlorothiazide (Hydrodiuril)  Meclizine (Antivert)  Methylprednisolone (Medrol Dose Pack)  Potassium ==================================================================== For clinical consultation, please call [phone]. ====================================================================    UDS interpretation: Compliant          Medication Assessment Form: Reviewed. Patient indicates being compliant with  therapy Treatment compliance: Compliant Risk  Assessment Profile: Aberrant behavior: See prior evaluations. None observed or detected today Comorbid factors increasing risk of overdose: See prior notes. No additional risks detected today Risk of substance use disorder (SUD): Low     Opioid Risk Tool - 10/09/17 0909      Family History of Substance Abuse   Alcohol Negative   Illegal Drugs Negative   Rx Drugs Negative     Personal History of Substance Abuse   Alcohol Negative   Illegal Drugs Negative   Rx Drugs Negative     Psychological Disease   Psychological Disease Positive   ADD Negative   OCD Negative   Bipolar Negative   Schizophrenia Negative   Depression Positive  patient taking lexapro but states that her PCP is going to change because it is not working for her.      Total Score   Opioid Risk Tool Scoring 3   Opioid Risk Interpretation Low Risk     ORT Scoring interpretation table:  Score <3 = Low Risk for SUD  Score between 4-7 = Moderate Risk for SUD  Score >8 = High Risk for Opioid Abuse   Risk Mitigation Strategies:  Patient Counseling: Covered Patient-Prescriber Agreement (PPA): Present and active  Notification to other healthcare providers: Done  Pharmacologic Plan: No change in therapy, at this time  Post-Procedure Assessment  09/18/18Procedure: Left LESI Pre-procedure pain score:  7/10 Post-procedure pain score: 0/10         Influential Factors: BMI: 26.12 kg/m Intra-procedural challenges: None observed.         Assessment challenges: None detected.              Reported side-effects: None.        Post-procedural adverse reactions or complications: None reported         Sedation: Please see nurses note. When no sedatives are used, the analgesic levels obtained are directly associated to the effectiveness of the local anesthetics. However, when sedation is provided, the level of analgesia obtained during the initial 1 hour following the intervention, is believed to be the result of a  combination of factors. These factors may include, but are not limited to: 1. The effectiveness of the local anesthetics used. 2. The effects of the analgesic(s) and/or anxiolytic(s) used. 3. The degree of discomfort experienced by the patient at the time of the procedure. 4. The patients ability and reliability in recalling and recording the events. 5. The presence and influence of possible secondary gains and/or psychosocial factors. Reported result: Relief experienced during the 1st hour after the procedure: 100 % (Ultra-Short Term Relief)            Interpretative annotation: Clinically appropriate result. Analgesia during this period is likely to be Local Anesthetic and/or IV Sedative (Analgesic/Anxiolytic) related.          Effects of local anesthetic: The analgesic effects attained during this period are directly associated to the localized infiltration of local anesthetics and therefore cary significant diagnostic value as to the etiological location, or anatomical origin, of the pain. Expected duration of relief is directly dependent on the pharmacodynamics of the local anesthetic used. Long-acting (4-6 hours) anesthetics used.  Reported result: Relief during the next 4 to 6 hour after the procedure: 100 % (Short-Term Relief)            Interpretative annotation: Clinically appropriate result. Analgesia during this period is likely to be Local Anesthetic-related.  Long-term benefit: Defined as the period of time past the expected duration of local anesthetics (1 hour for short-acting and 4-6 hours for long-acting). With the possible exception of prolonged sympathetic blockade from the local anesthetics, benefits during this period are typically attributed to, or associated with, other factors such as analgesic sensory neuropraxia, antiinflammatory effects, or beneficial biochemical changes provided by agents other than the local anesthetics.  Reported result: Extended relief following  procedure: 10 % (foot continues to be numb on the left) (Long-Term Relief)            Interpretative annotation: Clinically appropriate result. Good relief. No permanent benefit expected. Inflammation plays a part in the etiology to the pain.          Current benefits: Defined as reported results that persistent at this point in time.   Analgesia: 0-25 %            Function: No benefit ROM: No benefit Interpretative annotation: Recurrence of symptoms. No permanent benefit expected. Results would suggest persistent aggravating factors.          Interpretation: Results would suggest failure of therapy in achieving desired goal(s). Possible adjustment in plan of care may be required          Plan:  Please see "Plan of Care" for details.        Laboratory Chemistry  Inflammation Markers (CRP: Acute Phase) (ESR: Chronic Phase) Lab Results  Component Value Date   CRP <0.8 09/25/2016   ESRSEDRATE 17 08/13/2017                 Renal Function Markers Lab Results  Component Value Date   BUN 8 08/13/2017   CREATININE 0.76 08/13/2017   GFRAA >60 11/15/2016   GFRNONAA >60 11/15/2016                 Hepatic Function Markers Lab Results  Component Value Date   AST 16 08/13/2017   ALT 8 08/13/2017   ALBUMIN 4.1 08/13/2017   ALKPHOS 93 08/13/2017                 Electrolytes Lab Results  Component Value Date   NA 139 08/13/2017   K 3.2 (L) 08/22/2017   CL 101 08/13/2017   CALCIUM 9.7 08/13/2017   MG 1.9 09/25/2016                 Neuropathy Markers Lab Results  Component Value Date   VITAMINB12 446 03/06/2017                 Bone Pathology Markers Lab Results  Component Value Date   ALKPHOS 93 08/13/2017   25OHVITD1 14 (L) 09/25/2016   25OHVITD2 6.4 09/25/2016   25OHVITD3 7.7 09/25/2016   CALCIUM 9.7 08/13/2017                 Coagulation Parameters Lab Results  Component Value Date   PLT 313.0 08/13/2017                 Cardiovascular Markers Lab Results   Component Value Date   HGB 13.0 08/13/2017   HCT 40.0 08/13/2017                 Note: Lab results reviewed.  Recent Diagnostic Imaging Results  CT Abdomen Pelvis W Contrast CLINICAL DATA:  Initial evaluation for intermittent supraclavicular swelling for 1 year. Nine sweats. No history of cancer. History prior back surgery, cholecystectomy, and hysterectomy.  EXAM: CT CHEST, ABDOMEN, AND PELVIS WITH CONTRAST  TECHNIQUE: Multidetector CT imaging of the chest, abdomen and pelvis was performed following the standard protocol during bolus administration of intravenous contrast.  CONTRAST:  100 cc of Isovue 370.  COMPARISON:  Prior CT from 11/26/2016.  FINDINGS: CT CHEST FINDINGS  Cardiovascular: Intrathoracic aorta of normal caliber without aneurysm or other acute abnormality. Mild atheromatous plaque within the arch itself. Aberrant right subclavian artery noted. Heart size within normal limits. Coronary artery calcifications noted within the LAD. No pericardial effusion. Limited evaluation the pulmonary arterial tree grossly unremarkable.  Mediastinum/Nodes: Visualized thyroid is normal. No appreciable supraclavicular adenopathy or other abnormality. No pathologically enlarged mediastinal, hilar, or axillary lymph nodes identified. Esophagus within normal limits.  Lungs/Pleura: Tracheobronchial tree widely patent. Lungs are well inflated bilaterally. Mild scattered subsegmental atelectatic changes present within the lingula and bilateral lower lobes. Lungs are otherwise clear without focal infiltrates. No pulmonary edema or pleural effusion. No pneumothorax. 4 mm left lower lobe pulmonary nodule (series 603, image 111), grossly stable from previous. No other worrisome pulmonary nodule or mass. No findings to suggest underlying atypical lung infection.  Musculoskeletal: External soft tissues within normal limits. No acute osseous abnormality. No worrisome lytic or  blastic osseous lesions.  CT ABDOMEN PELVIS FINDINGS  Hepatobiliary: Liver demonstrates a normal contrast enhanced appearance. Gallbladder surgically absent. No biliary dilatation.  Pancreas: Diffuse fatty infiltration of the pancreas noted. Pancreas otherwise unremarkable without mass lesion or acute inflammatory changes. No pancreatic ductal dilatation.  Spleen: Spleen within normal limits.  Adrenals/Urinary Tract: Adrenal glands are normal. Kidneys equal in size with symmetric enhancement. 4.3 cm exophytic cyst seen extending from the posterior aspect of the right kidney. Kidneys otherwise unremarkable without nephrolithiasis, hydronephrosis, or focal enhancing renal mass. No hydroureter. Bladder poorly evaluated due to extensive streak artifact from bilateral hip arthroplasties.  Stomach/Bowel: Small hiatal hernia. Mild diffuse gastric wall thickening, likely related incomplete distension. Stomach otherwise unremarkable. No evidence for bowel obstruction. Appendix is normal. No abnormal wall thickening, mucosal enhancement, or inflammatory fat stranding seen about the bowels. Enteric contrast material reaches the distal transverse colon.  Vascular/Lymphatic: Mild aorto bi-iliac atherosclerotic disease. No aneurysm. Normal intravascular enhancement seen throughout the intra-abdominal aorta and its branch vessels. No pathologically enlarged intra-abdominal or pelvic lymph nodes.  Reproductive: Uterus is absent.  Ovaries within normal limits.  Other: No free air or fluid.  Musculoskeletal: Bilateral hip arthroplasties in place. Extensive postsurgical changes present within the lumbar spine. No acute osseous abnormality. No worrisome lytic or blastic osseous lesions.  IMPRESSION: 1. No acute abnormality identified within the chest, abdomen, and pelvis. No findings to explain patient's symptoms identified. 2. Status post cholecystectomy and hysterectomy, with bilateral  hip arthroplasties in place and extensive postsurgical changes within the lumbar spine. 3. Small hiatal hernia. 4. Aberrant right subclavian artery. 5. 4 mm left lower lobe pulmonary nodule, indeterminate, but stable relative to prior CT from 11/26/2016. No follow-up needed if patient is low-risk. Non-contrast chest CT can be considered in 6 months if patient is high-risk. This recommendation follows the consensus statement: Guidelines for Management of Incidental Pulmonary Nodules Detected on CT Images: From the Fleischner Society 2017; Radiology 2017; 284:228-243.  Electronically Signed   By: Rise Mu M.D.   On: 09/18/2017 15:44  Complexity Note: Imaging results reviewed. Results shared with Maria Hamilton, using Layman's terms.  Meds   Current Outpatient Prescriptions:  .  amLODipine (NORVASC) 10 MG tablet, Take 1 tablet (10 mg total) by mouth daily., Disp: 90 tablet, Rfl: 3 .  escitalopram (LEXAPRO) 20 MG tablet, TAKE 1 TABLET(20 MG) BY MOUTH DAILY, Disp: 90 tablet, Rfl: 2 .  gabapentin (NEURONTIN) 300 MG capsule, Take 1-3 capsules (300-900 mg total) by mouth 4 (four) times daily. Follow titration schedule., Disp: 360 capsule, Rfl: 5 .  pramipexole (MIRAPEX) 0.5 MG tablet, Take 1 tablet (0.5 mg total) by mouth 3 (three) times daily., Disp: 30 tablet, Rfl: 2 .  tizanidine (ZANAFLEX) 2 MG capsule, Take 1 capsule (2 mg total) by mouth 3 (three) times daily as needed for muscle spasms., Disp: 90 capsule, Rfl: 5 .  traMADol (ULTRAM) 50 MG tablet, Take 2 tablets (100 mg total) by mouth every 6 (six) hours as needed for moderate pain or severe pain., Disp: 240 tablet, Rfl: 5  ROS  Constitutional: Denies any fever or chills Gastrointestinal: No reported hemesis, hematochezia, vomiting, or acute GI distress Musculoskeletal: Denies any acute onset joint swelling, redness, loss of ROM, or weakness Neurological: No reported episodes of acute onset apraxia,  aphasia, dysarthria, agnosia, amnesia, paralysis, loss of coordination, or loss of consciousness  Allergies  Maria Hamilton is allergic to ace inhibitors; ciprofloxacin; fentanyl; morphine and related; penicillin g benzathine; isovue m [iopamidol]; and tape.  PFSH  Drug: Maria Hamilton  reports that she does not use drugs. Alcohol:  reports that she does not drink alcohol. Tobacco:  reports that she has never smoked. She has never used smokeless tobacco. Medical:  has a past medical history of Acute anxiety (08/03/2015); Acute bronchitis (12/31/2016); Acute pancreatitis (10/14/2016); Altered mental status (10/12/2016); Anxiety; Chronic hip pain; Chronic pain syndrome; Depression; Edema; Fatigue; Fibromyalgia; GERD (gastroesophageal reflux disease); Headache; Hypertension; Insomnia; Low back pain; Right upper quadrant abdominal pain; and Vitamin D deficiency. Surgical: Maria Hamilton  has a past surgical history that includes Breast surgery; Back surgery; Ectopic pregnancy surgery; Hip surgery; Abdominal hysterectomy; cyst removal from wrist; and Cholecystectomy (N/A, 10/16/2016). Family: family history includes Alcohol abuse in her father; Cancer in her father; Heart disease in her mother.  Constitutional Exam  General appearance: Well nourished, well developed, and well hydrated. In no apparent acute distress Vitals:   10/09/17 0902  BP: (!) 150/119  Pulse: 83  Resp: 16  Temp: (!) 95.6 F (35.3 C)  TempSrc: Oral  SpO2: 100%  Weight: 198 lb (89.8 kg)  Height: 6\' 1"  (1.854 m)   BMI Assessment: Estimated body mass index is 26.12 kg/m as calculated from the following:   Height as of this encounter: 6\' 1"  (1.854 m).   Weight as of this encounter: 198 lb (89.8 kg). Psych/Mental status: Alert, oriented x 3 (person, place, & time)       Eyes: PERLA Respiratory: No evidence of acute respiratory distress  Lumbar Spine Area Exam  Skin & Axial Inspection: No masses, redness, or swelling Alignment:  Symmetrical Functional ROM: Unrestricted ROM      Stability: No instability detected Muscle Tone/Strength: Functionally intact. No obvious neuro-muscular anomalies detected. Sensory (Neurological): Unimpaired Palpation: No palpable anomalies       Provocative Tests: Lumbar Hyperextension and rotation test: evaluation deferred today       Lumbar Lateral bending test: evaluation deferred today       Patrick's Maneuver: evaluation deferred today                    Gait &  Posture Assessment  Ambulation: Patient ambulates using a walker Gait: Relatively normal for age and body habitus Posture: WNL   Lower Extremity Exam    Side: Right lower extremity  Side: Left lower extremity  Skin & Extremity Inspection: Skin color, temperature, and hair growth are WNL. No peripheral edema or cyanosis. No masses, redness, swelling, asymmetry, or associated skin lesions. No contractures. 2 + pulses  Skin & Extremity Inspection: Skin color, temperature, and hair growth are WNL. No peripheral edema or cyanosis. No masses, redness, swelling, asymmetry, or associated skin lesions. No contractures. 2 + pulses  Functional ROM: Unrestricted ROM          Functional ROM: Unrestricted ROM          Muscle Tone/Strength: Functionally intact. No obvious neuro-muscular anomalies detected.  Muscle Tone/Strength: Functionally intact. No obvious neuro-muscular anomalies detected.  Sensory (Neurological): Unimpaired  Sensory (Neurological): Unimpaired  Palpation: No palpable anomalies  Palpation: Tender   Assessment  Primary Diagnosis & Pertinent Problem List: The primary encounter diagnosis was Chronic low back pain (Location of Primary Source of Pain) (Left). Diagnoses of Chronic lower extremity pain (Location of Secondary source of pain) (Left), Chronic foot numbness (Left), Chronic lumbar radicular pain (L5/S1 dermatome) (Location of Secondary source of pain) (Left), Chronic pain syndrome, Musculoskeletal pain, and  Neurogenic pain were also pertinent to this visit.  Status Diagnosis  Stable Persistent Not improving 1. Chronic low back pain (Location of Primary Source of Pain) (Left)   2. Chronic lower extremity pain (Location of Secondary source of pain) (Left)   3. Chronic foot numbness (Left)   4. Chronic lumbar radicular pain (L5/S1 dermatome) (Location of Secondary source of pain) (Left)   5. Chronic pain syndrome   6. Musculoskeletal pain   7. Neurogenic pain     Problems updated and reviewed during this visit: No problems updated. Plan of Care  Pharmacotherapy (Medications Ordered): Meds ordered this encounter  Medications  . tizanidine (ZANAFLEX) 2 MG capsule    Sig: Take 1 capsule (2 mg total) by mouth 3 (three) times daily as needed for muscle spasms.    Dispense:  90 capsule    Refill:  5    Do not place this medication, or any other prescription from our practice, on "Automatic Refill". Patient may have prescription filled one day early if pharmacy is closed on scheduled refill date.    Order Specific Question:   Supervising Provider    Answer:   Delano Metz [phone]  . gabapentin (NEURONTIN) 300 MG capsule    Sig: Take 1-3 capsules (300-900 mg total) by mouth 4 (four) times daily. Follow titration schedule.    Dispense:  360 capsule    Refill:  5    Do not place this medication, or any other prescription from our practice, on "Automatic Refill". Patient may have prescription filled one day early if pharmacy is closed on scheduled refill date.    Order Specific Question:   Supervising Provider    Answer:   Delano Metz [phone]  . pramipexole (MIRAPEX) 0.5 MG tablet    Sig: Take 1 tablet (0.5 mg total) by mouth 3 (three) times daily.    Dispense:  30 tablet    Refill:  2    Order Specific Question:   Supervising Provider    Answer:   Delano Metz [phone]   New Prescriptions   PRAMIPEXOLE (MIRAPEX) 0.5 MG TABLET    Take 1 tablet (0.5 mg total) by mouth 3  (three) times  daily.   Medications administered today: Maria Hamilton had no medications administered during this visit. Lab-work, procedure(s), and/or referral(s): Orders Placed This Encounter  Procedures  . Racz Epidurolysis  . Ambulatory referral to Physical Therapy   Imaging and/or referral(s): AMB REFERRAL TO PHYSICAL THERAPY   Interventional therapies: Planned, scheduled, and/or pending:   RACZepidurolysis of adhesions   Considering:   Left L2-3 translaminar lumbar epidural steroid injection #2 + left L2-3 transforaminal epidural steroid injection #2  Repeat diagnostic left sided caudal epidural steroid injection +epidurogram. Possible RACZepidurolysis of adhesions.   Palliative PRN treatment(s):   Left L2-3 translaminar lumbar epidural steroid injection #2 + left L2-3 transforaminal epidural steroid injection #2 under fluoroscopic guidance and IV sedation.   Provider-requested follow-up: Return in about 3 months (around 01/09/2018) for MedMgmt.  Future Appointments Date Time Provider Department Center  01/09/2018 10:30 AM Barbette Merino, NP [facility] None   Primary Care Physician: Glori Luis, MD Location: [facility] Note by: Barbette Merino NP Date: 10/09/2017; Time: 11:10 AM  Pain Score Disclaimer: We use the NRS-11 scale. This is a self-reported, subjective measurement of pain severity with only modest accuracy. It is used primarily to identify changes within a particular patient. It must be understood that outpatient pain scales are significantly less accurate that those used for research, where they can be applied under ideal controlled circumstances with minimal exposure to variables. In reality, the score is likely to be a combination of pain intensity and pain affect, where pain affect describes the degree of emotional arousal or changes in action readiness caused by the sensory experience of pain. Factors such as social and  work situation, setting, emotional state, anxiety levels, expectation, and prior pain experience may influence pain perception and show large inter-individual differences that may also be affected by time variables.  Patient instructions provided during this appointment: Patient Instructions   ____________________________________________________________________________________________  Pain Scale  Introduction: The pain score used by this practice is the Verbal Numerical Rating Scale (VNRS-11). This is an 11-point scale. It is for adults and children 10 years or older. There are significant differences in how the pain score is reported, used, and applied. Forget everything you learned in the past and learn this scoring system.  General Information: The scale should reflect your current level of pain. Unless you are specifically asked for the level of your worst pain, or your average pain. If you are asked for one of these two, then it should be understood that it is over the past 24 hours.  Basic Activities of Daily Living (ADL): Personal hygiene, dressing, eating, transferring, and using restroom.  Instructions: Most patients tend to report their level of pain as a combination of two factors, their physical pain and their psychosocial pain. This last one is also known as "suffering" and it is reflection of how physical pain affects you socially and psychologically. From now on, report them separately. From this point on, when asked to report your pain level, report only your physical pain. Use the following table for reference.  Pain Clinic Pain Levels (0-5/10)  Pain Level Score  Description  No Pain 0   Mild pain 1 Nagging, annoying, but does not interfere with basic activities of daily living (ADL). Patients are able to eat, bathe, get dressed, toileting (being able to get on and off the toilet and perform personal hygiene functions), transfer (move in and out of bed or a chair without  assistance), and maintain continence (able  to control bladder and bowel functions). Blood pressure and heart rate are unaffected. A normal heart rate for a healthy adult ranges from 60 to 100 bpm (beats per minute).   Mild to moderate pain 2 Noticeable and distracting. Impossible to hide from other people. More frequent flare-ups. Still possible to adapt and function close to normal. It can be very annoying and may have occasional stronger flare-ups. With discipline, patients may get used to it and adapt.   Moderate pain 3 Interferes significantly with activities of daily living (ADL). It becomes difficult to feed, bathe, get dressed, get on and off the toilet or to perform personal hygiene functions. Difficult to get in and out of bed or a chair without assistance. Very distracting. With effort, it can be ignored when deeply involved in activities.   Moderately severe pain 4 Impossible to ignore for more than a few minutes. With effort, patients may still be able to manage work or participate in some social activities. Very difficult to concentrate. Signs of autonomic nervous system discharge are evident: dilated pupils (mydriasis); mild sweating (diaphoresis); sleep interference. Heart rate becomes elevated (>115 bpm). Diastolic blood pressure (lower number) rises above 100 mmHg. Patients find relief in laying down and not moving.   Severe pain 5 Intense and extremely unpleasant. Associated with frowning face and frequent crying. Pain overwhelms the senses.  Ability to do any activity or maintain social relationships becomes significantly limited. Conversation becomes difficult. Pacing back and forth is common, as getting into a comfortable position is nearly impossible. Pain wakes you up from deep sleep. Physical signs will be obvious: pupillary dilation; increased sweating; goosebumps; brisk reflexes; cold, clammy hands and feet; nausea, vomiting or dry heaves; loss of appetite; significant sleep  disturbance with inability to fall asleep or to remain asleep. When persistent, significant weight loss is observed due to the complete loss of appetite and sleep deprivation.  Blood pressure and heart rate becomes significantly elevated. Caution: If elevated blood pressure triggers a pounding headache associated with blurred vision, then the patient should immediately seek attention at an urgent or emergency care unit, as these may be signs of an impending stroke.    Emergency Department Pain Levels (6-10/10)  Emergency Room Pain 6 Severely limiting. Requires emergency care and should not be seen or managed at an outpatient pain management facility. Communication becomes difficult and requires great effort. Assistance to reach the emergency department may be required. Facial flushing and profuse sweating along with potentially dangerous increases in heart rate and blood pressure will be evident.   Distressing pain 7 Self-care is very difficult. Assistance is required to transport, or use restroom. Assistance to reach the emergency department will be required. Tasks requiring coordination, such as bathing and getting dressed become very difficult.   Disabling pain 8 Self-care is no longer possible. At this level, pain is disabling. The individual is unable to do even the most "basic" activities such as walking, eating, bathing, dressing, transferring to a bed, or toileting. Fine motor skills are lost. It is difficult to think clearly.   Incapacitating pain 9 Pain becomes incapacitating. Thought processing is no longer possible. Difficult to remember your own name. Control of movement and coordination are lost.   The worst pain imaginable 10 At this level, most patients pass out from pain. When this level is reached, collapse of the autonomic nervous system occurs, leading to a sudden drop in blood pressure and heart rate. This in turn results in a temporary and dramatic  drop in blood flow to the brain,  leading to a loss of consciousness. Fainting is one of the body's self defense mechanisms. Passing out puts the brain in a calmed state and causes it to shut down for a while, in order to begin the healing process.    Summary: 1. Refer to this scale when providing [place] with your pain level. 2. Be accurate and careful when reporting your pain level. This will help with your care. 3. Over-reporting your pain level will lead to loss of credibility. 4. Even a level of 1/10 means that there is pain and will be treated at our facility. 5. High, inaccurate reporting will be documented as "Symptom Exaggeration", leading to loss of credibility and suspicions of possible secondary gains such as obtaining more narcotics, or wanting to appear disabled, for fraudulent reasons. 6. Only pain levels of 5 or below will be seen at our facility. 7. Pain levels of 6 and above will be sent to the Emergency Department and the appointment cancelled. ____________________________________________________________________________________________   ____________________________________________________________________________________________  Appointment Policy Summary  It is our goal and responsibility to provide the medical community with assistance in the evaluation and management of patients with chronic pain. Unfortunately our resources are limited. Because we do not have an unlimited amount of time, or available appointments, we are required to closely monitor and manage their use. The following rules exist to maximize their use:  Patient's responsibilities: 1. Punctuality:  At what time should I arrive? You should be physically present in our office 30 minutes before your scheduled appointment. Your scheduled appointment is with your assigned healthcare provider. However, it takes 5-10 minutes to be "checked-in", and another 15 minutes for the nurses to do the admission. If you arrive to our office at the time you were  given for your appointment, you will end up being at least 20-25 minutes late to your appointment with the provider. 2. Tardiness:  What happens if I arrive only a few minutes after my scheduled appointment time? You will need to reschedule your appointment. The cutoff is your appointment time. This is why it is so important that you arrive at least 30 minutes before that appointment. If you have an appointment scheduled for 10:00 AM and you arrive at 10:01, you will be required to reschedule your appointment.  3. Plan ahead:  Always assume that you will encounter traffic on your way in. Plan for it. If you are dependent on a driver, make sure they understand these rules and the need to arrive early. 4. Other appointments and responsibilities:  Avoid scheduling any other appointments before or after your pain clinic appointments.  5. Be prepared:  Write down everything that you need to discuss with your healthcare provider and give this information to the admitting nurse. Write down the medications that you will need refilled. Bring your pills and bottles (even the empty ones), to all of your appointments, except for those where a procedure is scheduled. 6. No children or pets:  Find someone to take care of them. It is not appropriate to bring them in. 7. Scheduling changes:  We request "advanced notification" of any changes or cancellations. 8. Advanced notification:  Defined as a time period of more than 24 hours prior to the originally scheduled appointment. This allows for the appointment to be offered to other patients. 9. Rescheduling:  When a visit is rescheduled, it will require the cancellation of the original appointment. For this reason they both fall within the  category of "Cancellations".  10. Cancellations:  They require advanced notification. Any cancellation less than 24 hours before the  appointment will be recorded as a "No Show". 11. No Show:  Defined as an unkept appointment  where the patient failed to notify or declare to the practice their intention or inability to keep the appointment.  Corrective process for repeat offenders:  1. Tardiness: Three (3) episodes of rescheduling due to late arrivals will be recorded as one (1) "No Show". 2. Cancellation or reschedule: Three (3) cancellations or rescheduling will be recorded as one (1) "No Show". 3. "No Shows": Three (3) "No Shows" within a 12 month period will result in discharge from the practice.  ____________________________________________________________________________________________   BMI interpretation table: BMI level Category Range association with higher incidence of chronic pain  <18 kg/m2 Underweight   18.5-24.9 kg/m2 Ideal body weight   25-29.9 kg/m2 Overweight Increased incidence by 20%  30-34.9 kg/m2 Obese (Class I) Increased incidence by 68%  35-39.9 kg/m2 Severe obesity (Class II) Increased incidence by 136%  >40 kg/m2 Extreme obesity (Class III) Increased incidence by 254%   BMI Readings from Last 4 Encounters:  10/09/17 26.12 kg/m  09/03/17 26.39 kg/m  08/13/17 26.57 kg/m  06/14/17 27.05 kg/m   Wt Readings from Last 4 Encounters:  10/09/17 198 lb (89.8 kg)  09/03/17 200 lb (90.7 kg)  08/13/17 201 lb 6.4 oz (91.4 kg)  06/14/17 205 lb (93 kg)  Lysis of Epidural Adhesions Utilizing the Epidural Approach (RACZ Epidural Adhesiolysis)  Introduction Bleeding into the epidural space following surgery or leakage of disc material following breakage or a tear of a disc most commonly causes epidural scarring.  Presumably, inflammation and compression of nerve roots by epidural scar (Adhesions) are the mechanism of persistent pain following back surgery, ruptured or herniated discs, or vertebral body fracture.  Epidural scar may also contribute to the pain of spinal column metastatic carcinoma, failed facet joint syndrome, and unexplained neck or low back pain.   Diagnosis Conventional  studies such as myelograms, computerized tomography (CT Scans), and magnetic resonance imaging (MRI), are usually inadequate to make the diagnosis.  Injection of contrast material (dye) into the epidural space yields an "epidurogram", which is diagnostic for the presence or absence of epidural scar tissue.   Procedure Since further surgery would only produce more scar tissue, a different approach to the problem needs to be taken.  "Lysis of Epidural Adhesions" (Breakage of scar tissue) is an alternative to this problem.  The procedure consists of introducing an epidural catheter (thin plastic tube) into the epidural space (space between your spinal cord and the walls of the spinal canal, within your backbone).  Once in place, medications are injected through this tubing in order to break the scar tissue.  Although the catheter is placed within fifteen to twenty minutes, it is kept in the epidural space for a couple of hours.  During this time, the injection of medications through the tubing is performed.  At the end of the procedure the catheter is removed and the patient discharged to home.   Conditions Usually Treated with This Modality Conditions include, but are not limited to: Marland Kitchen Failed Back Surgery Syndrome . Post-Laminectomy Syndrome . Epidural Adhesions . Back and leg pain . Sciatica . Radiculopathy . Neck and arm pain . Leg pain . Disc disruption . Traumatic vertebral body compression fracture . Degenerative arthritis of the spine . Facet pain . Epidural scarring following infection . Occipital neuralgia . Others  Side Effects and Possible Complications Everything in medicine is subject to side effects and possible complications.  No two patients are alike.  Side effects and complications are not the same or equivalent to malpractice.  Malpractice refers to an injury sustained by a patient, which occurs as a consequence of negligence in the practice of medicine.  Side effects and  complications, on the other hand, are untoward events, which can occur and may injure a patient, in certain percentages of the population.  These events can, and do occur even if everything goes according to plan, and in the absence of negligence or malpractice.  Possible side effects and complications of this procedure include, but are not limited to. . Pain or worsening of symptoms . Infection (local = abscess; or generalized = sepsis), including meningitis and death. Infection due to a steroid-induced immune system suppression. . Bleeding, including hematomas, which might compress the spinal cord, therefore causing paralysis. . Nerve damage, including sensory or motor weakness, and/or paralysis. Nerve damage, ranging from minor nerve irritation with pain, to major nerve damage with paralysis, impotence, urinary incontinence, and/or fecal incontinence. . Allergic reactions ranging from a minor rush to an anaphylactic reaction and death . Failure to relieve pain . Spinal cord compression leading to paralysis . Breakage of catheter (tubing) . Unforeseen events:   Guarantees That It Will Help None. There are no guarantees in medicine.   Results of Treatment 93.9% of patients will experience some pain relief, which will be variable.  6.1% will experience no relief.     Duration of Results Only 12.3% will experience persistent pain relief beyond 12 months.  57.9% of female patients and 64.4% of female patients will experience between 0-3 months of variable degrees of pain relief.   Frequently Asked Questions  What is the incidence of complications? It is unknown.  Most complications occur sporadically and are reported as isolated case reports.  Can the procedure be repeated when and if my pain returns? Yes.  The effectiveness of subsequent repeat procedures is also variable.  Some patients obtain longer duration of pain relief while others obtain shorter duration.  Does this require that I be  hospitalized? No.  On the average the procedure takes anywhere from 1 to 3 hours to complete.  What should I expect to feel after the procedure? If the procedure is successful, you should experience pain relief of variable degrees.  Will I obtain complete (100%) pain relief? Although possible, it is our experience that patients suffering from chronic pain, the cause of the pain is multifactorial, and therefore, highly unlikely to completely address the problem with only one type of therapy.  What is an Epidurolysis (RACZ) Procedure? Epidurolysis (RACZ) Procedure is used to dissolve some of the scar tissue from around entrapped nerves in the Epidural space of spine, so that medications such as cortisone can reach the affected areas. Dr. Phillips Hay pioneered this procedure.   What causes scarring (adhesions)? Scarring is most commonly caused from bleeding into the Epidural space following back surgery and the subsequent healing process. It is a natural occurrence following surgical intervention. Sometimes scarring can also occur when a disk ruptures and its contents leak out.   What is the purpose of it? To allow medications to reach affected nerves so that pain and other symptoms may be diminished.   How long does the procedure take?  First, a small catheter (surgical tube) is inserted in the Epidural space up to the area where the scar  is located. This is done under sterile conditions using x-ray guidance. This epidural catheter is secured to the skin using tape and the patient is transported to the recovery room. This part may take 45 minutes. While in the recovery area, series of medications will be injected via the catheter. This part may take 1.0 to 2.0 hours. The patient is kept in the recovery room for the duration of the procedure. Once all medications are injected, then the catheter is removed. The actual injections only take a few minutes, however, the procedure protocol calls for certain  periods of time to elapse between the administration of medications.   What is actually injected? The injection consists of a mixture of a local anesthetic (numbing medicine), a steroid (anti-inflammatory medication), radiological contrast dye (to visualize the scar tissue), hyaluronidase (medicine to soften the scar), and a hyper-concentrated solution sterile salt water (to remove fluid from the swollen nerves via osmosis).   Will the injection hurt? Patients receive intravenous sedation and local infiltration of numbing medicine to minimize the discomfort from the procedure. Because the procedure involves the insertion of a needle, some discomfort is to be expected. In addition, because there is scar tissue and swelling inside of the epidural space, as the medications are injected, the sensation of pressure should be expected.   Will I be "put out" for this procedure? No. This procedure is done under local anesthesia and sedation, which makes the procedure easy to tolerate. The amount of sedation given generally depends upon the patient's tolerance. Communication with the patients during the procedure is essential to avoid complications.   How is the procedure performed? Catheter placement is done with the patient lying on their stomach. During the procedure, the patient is monitored with a heart monitor, blood pressure monitor, and a blood oxygen monitoring device. The skin in the back is cleaned with antiseptic solution and then the procedure is carried out. After the procedure, you are placed on your back or on your side. X-rays are used to assist in the placement of the catheter and to perform the epidurogram (injection of radiological contrast to confirm adequate placement).   What should I expect after the injection? Immediately after the injection, you may feel your legs slightly heavy and numb. Also, you may notice that your pain may improve or disappear. This may only last for a few hours  and it is due to the local anesthetics (numbing medicine).    When can I return to work? Unless there are any complications, you should be able to return to work the next day. However, it may take a couple of days for you to get the full benefit of the procedure.   How long the effects of the medication last? The immediate effect is usually from the local anesthetic injected. This wears off in a few hours. The cortisone starts working in about 5 to 10 days and its effect can last for several days to a few months. The benefits from the procedure may last as long as 6 month.   How many times do I need to have this procedure performed? If the first procedure does not relieve your symptoms within one to two weeks, you may be recommended to have one more procedure. After this, we recommend 6 months between [facility] procedures.   Will the Epidurolysis (RACZ) Procedure help me? It is very difficult to predict if the procedure will indeed help you or not. Generally speaking, the patients who have recent scarring (e.g.  following back surgery) respond better.    What are the risks and side effects? Generally speaking, this procedure is relatively safe. However, with any procedure there are risks and possible complications. These include, but are not limited to: spinal headaches; central nervous system infections such as meningitis, or epidural abscesses; epidural hematomas with possible spinal cord compression and subsequent permanent paralysis; worsening of symptoms; failure to provide relief; nerve damage; urinary or fecal incontinence; blood vessel damage, etc. Side effects related to the steroids, such as: weight gain; increase in blood sugar (mainly in diabetics); water retention; suppression of body's immune system; mood disturbances, etc.. Some of patients may have anaphylactic allergic reactions to hyaluronidase. Fortunately, the serious side effects and complications are uncommon.   Who should not have  this procedure? If you are allergic to any of the medications to be injected, if you are on a blood thinning medication (e.i. Coumadin), or if you have an active infection going on, you should not have the injection. The following are absolute contraindications for performing epidural adhesiolysis: 1. Sepsis 2. Chronic infection 3. Coagulopathy (Bleeding disorder) 4. Local infection at the procedure site 5. Patient refusal 6. Syrinx formation 7. A relative contraindication is the presence of arachnoiditis.  How do I prepare for the procedure? 1. Do not eat or drink for 6 hours before the procedure. 2. If you take blood pressure medication in the morning, take it as usual, with just a sip of water. 3. Take a shower the morning of the procedure using an antibacterial soap such as "Right Guard". 4. Take a 1000 mg Vitamin C every day, before and after the procedure to build up your immune system and decrease the risk of infections. 5. You may drink water up to 2 hours before the procedure. Empty your bladder before getting your IV started.Lysis of Epidural Adhesions Utilizing the Epidural Approach (RACZ Epidural Adhesiolysis)  Introduction Bleeding into the epidural space following surgery or leakage of disc material following breakage or a tear of a disc most commonly causes epidural scarring.  Presumably, inflammation and compression of nerve roots by epidural scar (Adhesions) are the mechanism of persistent pain following back surgery, ruptured or herniated discs, or vertebral body fracture.  Epidural scar may also contribute to the pain of spinal column metastatic carcinoma, failed facet joint syndrome, and unexplained neck or low back pain.   Diagnosis Conventional studies such as myelograms, computerized tomography (CT Scans), and magnetic resonance imaging (MRI), are usually inadequate to make the diagnosis.  Injection of contrast material (dye) into the epidural space yields an  "epidurogram", which is diagnostic for the presence or absence of epidural scar tissue.   Procedure Since further surgery would only produce more scar tissue, a different approach to the problem needs to be taken.  "Lysis of Epidural Adhesions" (Breakage of scar tissue) is an alternative to this problem.  The procedure consists of introducing an epidural catheter (thin plastic tube) into the epidural space (space between your spinal cord and the walls of the spinal canal, within your backbone).  Once in place, medications are injected through this tubing in order to break the scar tissue.  Although the catheter is placed within fifteen to twenty minutes, it is kept in the epidural space for a couple of hours.  During this time, the injection of medications through the tubing is performed.  At the end of the procedure the catheter is removed and the patient discharged to home.   Conditions Usually Treated with This  Modality Conditions include, but are not limited to: Failed Back Surgery Syndrome Post-Laminectomy Syndrome Epidural Adhesions Back and leg pain Sciatica Radiculopathy Neck and arm pain Leg pain Disc disruption Traumatic vertebral body compression fracture Degenerative arthritis of the spine Facet pain Epidural scarring following infection Occipital neuralgia Others  Side Effects and Possible Complications Everything in medicine is subject to side effects and possible complications.  No two patients are alike.  Side effects and complications are not the same or equivalent to malpractice.  Malpractice refers to an injury sustained by a patient, which occurs as a consequence of negligence in the practice of medicine.  Side effects and complications, on the other hand, are untoward events, which can occur and may injure a patient, in certain percentages of the population.  These events can, and do occur even if everything goes according to plan, and in the absence of negligence or  malpractice.  Possible side effects and complications of this procedure include, but are not limited to. Pain or worsening of symptoms Infection (local = abscess; or generalized = sepsis), including meningitis and death. Infection due to a steroid-induced immune system suppression. Bleeding, including hematomas, which might compress the spinal cord, therefore causing paralysis. Nerve damage, including sensory or motor weakness, and/or paralysis. Nerve damage, ranging from minor nerve irritation with pain, to major nerve damage with paralysis, impotence, urinary incontinence, and/or fecal incontinence. Allergic reactions ranging from a minor rush to an anaphylactic reaction and death Failure to relieve pain Spinal cord compression leading to paralysis Breakage of catheter (tubing) Unforeseen events:   Guarantees That It Will Help None. There are no guarantees in medicine.   Results of Treatment 93.9% of patients will experience some pain relief, which will be variable.  6.1% will experience no relief.     Duration of Results Only 12.3% will experience persistent pain relief beyond 12 months.  57.9% of female patients and 64.4% of female patients will experience between 0-3 months of variable degrees of pain relief.   Frequently Asked Questions  What is the incidence of complications? It is unknown.  Most complications occur sporadically and are reported as isolated case reports.  Can the procedure be repeated when and if my pain returns? Yes.  The effectiveness of subsequent repeat procedures is also variable.  Some patients obtain longer duration of pain relief while others obtain shorter duration.  Does this require that I be hospitalized? No.  On the average the procedure takes anywhere from 1 to 3 hours to complete.  What should I expect to feel after the procedure? If the procedure is successful, you should experience pain relief of variable degrees.  Will I obtain complete (100%)  pain relief? Although possible, it is our experience that patients suffering from chronic pain, the cause of the pain is multifactorial, and therefore, highly unlikely to completely address the problem with only one type of therapy.  What is an Epidurolysis (RACZ) Procedure? Epidurolysis (RACZ) Procedure is used to dissolve some of the scar tissue from around entrapped nerves in the Epidural space of spine, so that medications such as cortisone can reach the affected areas. Dr. Phillips Hay pioneered this procedure.   What causes scarring (adhesions)? Scarring is most commonly caused from bleeding into the Epidural space following back surgery and the subsequent healing process. It is a natural occurrence following surgical intervention. Sometimes scarring can also occur when a disk ruptures and its contents leak out.   What is the purpose of it? To allow medications  to reach affected nerves so that pain and other symptoms may be diminished.   How long does the procedure take?  First, a small catheter (surgical tube) is inserted in the Epidural space up to the area where the scar is located. This is done under sterile conditions using x-ray guidance. This epidural catheter is secured to the skin using tape and the patient is transported to the recovery room. This part may take 45 minutes. While in the recovery area, series of medications will be injected via the catheter. This part may take 1.0 to 2.0 hours. The patient is kept in the recovery room for the duration of the procedure. Once all medications are injected, then the catheter is removed. The actual injections only take a few minutes, however, the procedure protocol calls for certain periods of time to elapse between the administration of medications.   What is actually injected? The injection consists of a mixture of a local anesthetic (numbing medicine), a steroid (anti-inflammatory medication), radiological contrast dye (to visualize the  scar tissue), hyaluronidase (medicine to soften the scar), and a hyper-concentrated solution sterile salt water (to remove fluid from the swollen nerves via osmosis).   Will the injection hurt? Patients receive intravenous sedation and local infiltration of numbing medicine to minimize the discomfort from the procedure. Because the procedure involves the insertion of a needle, some discomfort is to be expected. In addition, because there is scar tissue and swelling inside of the epidural space, as the medications are injected, the sensation of pressure should be expected.   Will I be "put out" for this procedure? No. This procedure is done under local anesthesia and sedation, which makes the procedure easy to tolerate. The amount of sedation given generally depends upon the patient's tolerance. Communication with the patients during the procedure is essential to avoid complications.   How is the procedure performed? Catheter placement is done with the patient lying on their stomach. During the procedure, the patient is monitored with a heart monitor, blood pressure monitor, and a blood oxygen monitoring device. The skin in the back is cleaned with antiseptic solution and then the procedure is carried out. After the procedure, you are placed on your back or on your side. X-rays are used to assist in the placement of the catheter and to perform the epidurogram (injection of radiological contrast to confirm adequate placement).   What should I expect after the injection? Immediately after the injection, you may feel your legs slightly heavy and numb. Also, you may notice that your pain may improve or disappear. This may only last for a few hours and it is due to the local anesthetics (numbing medicine).    When can I return to work? Unless there are any complications, you should be able to return to work the next day. However, it may take a couple of days for you to get the full benefit of the  procedure.   How long the effects of the medication last? The immediate effect is usually from the local anesthetic injected. This wears off in a few hours. The cortisone starts working in about 5 to 10 days and its effect can last for several days to a few months. The benefits from the procedure may last as long as 6 month.   How many times do I need to have this procedure performed? If the first procedure does not relieve your symptoms within one to two weeks, you may be recommended to have one more procedure. After  this, we recommend 6 months between [facility] procedures.   Will the Epidurolysis (RACZ) Procedure help me? It is very difficult to predict if the procedure will indeed help you or not. Generally speaking, the patients who have recent scarring (e.g. following back surgery) respond better.    What are the risks and side effects? Generally speaking, this procedure is relatively safe. However, with any procedure there are risks and possible complications. These include, but are not limited to: spinal headaches; central nervous system infections such as meningitis, or epidural abscesses; epidural hematomas with possible spinal cord compression and subsequent permanent paralysis; worsening of symptoms; failure to provide relief; nerve damage; urinary or fecal incontinence; blood vessel damage, etc. Side effects related to the steroids, such as: weight gain; increase in blood sugar (mainly in diabetics); water retention; suppression of body's immune system; mood disturbances, etc.. Some of patients may have anaphylactic allergic reactions to hyaluronidase. Fortunately, the serious side effects and complications are uncommon.   Who should not have this procedure? If you are allergic to any of the medications to be injected, if you are on a blood thinning medication (e.i. Coumadin), or if you have an active infection going on, you should not have the injection. The following are absolute  contraindications for performing epidural adhesiolysis: Sepsis Chronic infection Coagulopathy (Bleeding disorder) Local infection at the procedure site Patient refusal Syrinx formation A relative contraindication is the presence of arachnoiditis.  How do I prepare for the procedure? Do not eat or drink for 6 hours before the procedure. If you take blood pressure medication in the morning, take it as usual, with just a sip of water. Take a shower the morning of the procedure using an antibacterial soap such as "Right Guard". Take a 1000 mg Vitamin C every day, before and after the procedure to build up your immune system and decrease the risk of infections. You may drink water up to 2 hours before the procedure. 6. Empty your bladder before getting your IV started.

## 2017-10-09 NOTE — Progress Notes (Signed)
Nursing Pain Medication Assessment:  Safety precautions to be maintained throughout the outpatient stay will include: orient to surroundings, keep bed in low position, maintain call bell within reach at all times, provide assistance with transfer out of bed and ambulation.  Medication Inspection Compliance: Pill count conducted under aseptic conditions, in front of the patient. Neither the pills nor the bottle was removed from the patient's sight at any time. Once count was completed pills were immediately returned to the patient in their original bottle.  Medication: Tramadol (Ultram) Pill/Patch Count: 137 of 240 pills remain Pill/Patch Appearance: Markings consistent with prescribed medication Bottle Appearance: Standard pharmacy container. Clearly labeled. Filled Date: 02 / 22 / 2018 Last Medication intake:  patient does not take because they make her sick

## 2017-10-10 ENCOUNTER — Telehealth: Payer: Self-pay | Admitting: Nurse Practitioner

## 2017-10-10 DIAGNOSIS — G2581 Restless legs syndrome: Secondary | ICD-10-CM | POA: Insufficient documentation

## 2017-10-10 NOTE — Telephone Encounter (Signed)
Pharmacy called about script for Tizanidine, they have question regarding tablet or capsule. Please call

## 2017-10-10 NOTE — Telephone Encounter (Signed)
Contacted pharmacy, prescription changed to tablets.

## 2017-10-16 ENCOUNTER — Encounter: Payer: Self-pay | Admitting: Family Medicine

## 2017-10-16 ENCOUNTER — Other Ambulatory Visit: Payer: Self-pay | Admitting: Family Medicine

## 2017-10-16 ENCOUNTER — Telehealth: Payer: Self-pay | Admitting: Family Medicine

## 2017-10-16 ENCOUNTER — Ambulatory Visit (INDEPENDENT_AMBULATORY_CARE_PROVIDER_SITE_OTHER): Payer: Medicare HMO | Admitting: Family Medicine

## 2017-10-16 DIAGNOSIS — R61 Generalized hyperhidrosis: Secondary | ICD-10-CM

## 2017-10-16 DIAGNOSIS — J069 Acute upper respiratory infection, unspecified: Secondary | ICD-10-CM

## 2017-10-16 DIAGNOSIS — M25572 Pain in left ankle and joints of left foot: Secondary | ICD-10-CM | POA: Diagnosis not present

## 2017-10-16 DIAGNOSIS — B9789 Other viral agents as the cause of diseases classified elsewhere: Secondary | ICD-10-CM | POA: Diagnosis not present

## 2017-10-16 DIAGNOSIS — F419 Anxiety disorder, unspecified: Secondary | ICD-10-CM

## 2017-10-16 DIAGNOSIS — M25579 Pain in unspecified ankle and joints of unspecified foot: Secondary | ICD-10-CM | POA: Insufficient documentation

## 2017-10-16 DIAGNOSIS — F32A Depression, unspecified: Secondary | ICD-10-CM | POA: Insufficient documentation

## 2017-10-16 DIAGNOSIS — Z23 Encounter for immunization: Secondary | ICD-10-CM | POA: Diagnosis not present

## 2017-10-16 DIAGNOSIS — F329 Major depressive disorder, single episode, unspecified: Secondary | ICD-10-CM | POA: Insufficient documentation

## 2017-10-16 MED ORDER — SERTRALINE HCL 50 MG PO TABS: 50.0000 mg | ORAL_TABLET | Freq: Every day | ORAL | 3 refills | Status: DC

## 2017-10-16 NOTE — Progress Notes (Signed)
  [name], MD Phone: [phone]  Maria Hamilton is a 66 y.o. female who presents today for follow-up.  Patient notes 2 days of anterior left ankle swelling and discomfort. Notes it was puffy last night. No injury. Feels stiff. Does hurt. She has taken Tylenol with little benefit.  Anxiety: Notes no depression. Does note she had quite a bit of anxiety since coming off the Lexapro. Now she's had difficulty sleeping at night though she does sleep 6-7 hours during the day. She is up doing things at night.  She notes the night sweats and daytime sweats did not improve with stopping the Lexapro. She continues to have drenching sweats at night though also has some during the day. So far workup has been unremarkable with negative imaging and lab work.  She does note slight cough over the last several days. A little bit of phlegm. No upper respiratory symptoms. No reflux. No blood in her cough.  PMH: nonsmoker.   ROS see history of present illness  Objective  Physical Exam Vitals:   10/16/17 1107  BP: 130/86  Pulse: 86  Temp: 98.3 F (36.8 C)  SpO2: 98%    BP Readings from Last 3 Encounters:  10/16/17 130/86  10/09/17 (!) 150/119  09/03/17 (!) 163/97   Wt Readings from Last 3 Encounters:  10/16/17 206 lb 3.2 oz (93.5 kg)  10/09/17 198 lb (89.8 kg)  09/03/17 200 lb (90.7 kg)    Physical Exam  Constitutional: No distress.  HENT:  Head: Normocephalic and atraumatic.  Cardiovascular: Normal rate, regular rhythm and normal heart sounds.   Pulmonary/Chest: Effort normal and breath sounds normal.  Musculoskeletal: She exhibits no edema.  Bilateral anterior ankle slightly puffy, slight tenderness of left anterior ankle  Neurological: She is alert. Gait normal.  Skin: She is not diaphoretic.     Assessment/Plan: Please see individual problem list.  Ankle pain Ankle pain noted with slight edema anteriorly. Slight tenderness. Suspect muscular strain or inflammation.  Discussed ice and rest. Can add ibuprofen as outlined in the AVS. Take with food. If not improving she'll let us know.  Night sweats Continues to have issues with this. Did not improve with discontinuing Lexapro. Could be gabapentin related. It could be postmenopausal related. Imaging and lab work thus far has been unremarkable. We'll send a message to hematology to see if there is any workup from their perspective. She'll continue to monitor at this time.  Anxiety Patient with anxiety. We will add in Zoloft as the Lexapro was not terribly beneficial. She'll monitor and see how she does on this.  Viral URI with cough Suspect viral cause for cough. If not improving she'll let us know. Recent CT chest unremarkable for possible underlying cause.   Orders Placed This Encounter  Procedures  . Flu vaccine HIGH DOSE PF    Meds ordered this encounter  Medications  . sertraline (ZOLOFT) 50 MG tablet    Sig: Take 1 tablet (50 mg total) by mouth daily.    Dispense:  30 tablet    Refill:  [place], MD [place]

## 2017-10-16 NOTE — Assessment & Plan Note (Signed)
Suspect viral cause for cough. If not improving she'll let us know. Recent CT chest unremarkable for possible underlying cause.

## 2017-10-16 NOTE — Assessment & Plan Note (Signed)
Continues to have issues with this. Did not improve with discontinuing Lexapro. Could be gabapentin related. It could be postmenopausal related. Imaging and lab work thus far has been unremarkable. We'll send a message to hematology to see if there is any workup from their perspective. She'll continue to monitor at this time.

## 2017-10-16 NOTE — Assessment & Plan Note (Signed)
Ankle pain noted with slight edema anteriorly. Slight tenderness. Suspect muscular strain or inflammation. Discussed ice and rest. Can add ibuprofen as outlined in the AVS. Take with food. If not improving she'll let us know.

## 2017-10-16 NOTE — Telephone Encounter (Signed)
Please let the patient know I heard back from hematology and they noted other than getting her mammogram up to date there is nothing much else to do from their perspective. We did check her colonoscopy report last year and I believe there were no polyps though I'm having trouble finding this again as it does not appear to have been scanned in. We will re-request this to review again.

## 2017-10-16 NOTE — Telephone Encounter (Signed)
Patient notified

## 2017-10-16 NOTE — Patient Instructions (Signed)
Nice to see you. Please monitor your cough and if it does not improve please let us know. We'll start you on Zoloft for your anxiety. Please use ice and rest for your ankles. You could try adding 400 mg of ibuprofen every 6 hours as needed for discomfort if the ice is not beneficial. You need to take this with food.

## 2017-10-16 NOTE — Telephone Encounter (Signed)
-----  Message from Lloyd Huger, MD sent at 10/16/2017  1:12 PM EDT ----- No problem.  Same with this patient, with normal labs and CT scan not much to add from our standpoint currently.  Colonoscopy and Mammo up to date?  -Tim  ----- Message ----- From: Leone Haven, MD Sent: 10/16/2017  12:54 PM To: Lloyd Huger, MD  Hi Dr Grayland Ormond,   Thank you for your help with my previous patient regarding night sweats. I have another patient to ask about specifically to see if anything else needs to be done from the heme-onc perspective. This patient has been having drenching sweats at night though also has them during the day. She did lose weight over a period of several months earlier this year, though this was following abdominal surgery. Her weight has stabilized. She has had no itching. Her labs (TSH, CBC, CMET, ESR) and imaging (CT abd/pelvis/chest) have not revealed a cause. She has had supraclavicular fullness intermittently though no adenopathy. She did have some palpable inguinal adenopathy on exam though they did not feel grossly enlarged and were not enlarged on CT. We tried altering some of her medications with no luck either. Thanks for your help.   Randall Hiss

## 2017-10-16 NOTE — Assessment & Plan Note (Signed)
Patient with anxiety. We will add in Zoloft as the Lexapro was not terribly beneficial. She'll monitor and see how she does on this.

## 2017-10-17 ENCOUNTER — Telehealth: Payer: Self-pay | Admitting: Family Medicine

## 2017-10-17 ENCOUNTER — Telehealth: Payer: Self-pay | Admitting: Nurse Practitioner

## 2017-10-17 DIAGNOSIS — K8689 Other specified diseases of pancreas: Secondary | ICD-10-CM

## 2017-10-17 NOTE — Telephone Encounter (Signed)
-----   Message from Jonathon Bellows, MD sent at 10/17/2017  8:55 AM EDT ----- Good morning Dr Caryl Bis  Sorry for the delay. Fatty infiltration of the pancreas is commonly associated with metabolic syndrome very similar to fatty liver. At this time there are no other clear recommendations apart from the same we advise for fattly liver (NAFLD)., unless they have history of pancreatitis or family history of pancreatic cancer.    Regards  Kiran    ----- Message ----- From: Leone Haven, MD Sent: 10/16/2017  12:57 PM To: Jonathon Bellows, MD  Hi Dr Vicente Males,   I wanted to see if you could help with a question. This patient has had fatty infiltration of her pancreas on imaging apparently first noted about a year ago. She had cholecystitis at that time and underwent cholecystectomy. I just wanted to see if there was anything to do with regards to work up of fatty pancreas or if we just monitor. Thanks for your help.  Randall Hiss

## 2017-10-17 NOTE — Telephone Encounter (Signed)
Please let her know I sent a message back to the GI physician to see if there is anything to be done given prior gall stone pancreatitis. If nothing related to that it will be working on diet and exercise to ensure that she maintains a good weight and cholesterol. We will need to follow her cholesterol and weight closely. Thanks.

## 2017-10-17 NOTE — Telephone Encounter (Signed)
Please let the patient know that I messaged a GI physician after reviewing her most recent CT scans again. It appears that she's had fatty duration of her pancreas for some time now. They noted this was likely related to metabolic syndrome which relates to being overweight or obese and cholesterol issues. Please see if she has any family history of pancreatic cancer. If she does this could dictate needing workup for this.

## 2017-10-17 NOTE — Telephone Encounter (Signed)
Patient notified and states she does not have any family history of pancreatic cancer, she would like to know what she needs to do or if there will be any further work up.

## 2017-10-17 NOTE — Telephone Encounter (Signed)
Left message to return call 

## 2017-10-17 NOTE — Telephone Encounter (Signed)
Left message for patient to call me for which medicine she needs PA for.

## 2017-10-17 NOTE — Telephone Encounter (Signed)
Patient needs prior auth for medications, she is out of one already. Please call asap

## 2017-10-19 NOTE — Telephone Encounter (Signed)
Please let the patient know I heard back from GI and we think it may be a good idea to see them at least once in the office to determine if any further work up is needed. I can place a referral to Dr Vicente Males if she is ok with this. Thanks.

## 2017-10-21 ENCOUNTER — Other Ambulatory Visit: Payer: Self-pay | Admitting: Family Medicine

## 2017-10-21 NOTE — Telephone Encounter (Signed)
Copied from Branch [id number]. Topic: General - Other >> Oct 21, 2017 12:42 PM Conception Chancy, NT wrote: Reason for CRM: pt was seen last week, states dr told her if her cough did not get better he would call her in some cough medicine. She would like to get that.

## 2017-10-21 NOTE — Telephone Encounter (Signed)
Tried calling, no vm 

## 2017-10-21 NOTE — Telephone Encounter (Signed)
Please see if she is having any additional symptoms such as fever, cough production, shortness of breath, congestion, or other symptoms.  Thanks.

## 2017-10-21 NOTE — Telephone Encounter (Signed)
Patient saw you last week. States that cough has not went away. Would like cough medicine called in.

## 2017-10-21 NOTE — Telephone Encounter (Signed)
Attempted to reach patient unable to leave v/m

## 2017-10-22 MED ORDER — BENZONATATE 200 MG PO CAPS: 200.0000 mg | ORAL_CAPSULE | Freq: Two times a day (BID) | ORAL | 0 refills | Status: DC | PRN

## 2017-10-22 NOTE — Addendum Note (Signed)
Addended by: Caryl Bis Myron Stankovich G on: 10/22/2017 01:31 PM   Modules accepted: Orders

## 2017-10-22 NOTE — Addendum Note (Signed)
Addended by: Leone Haven on: 10/22/2017 01:33 PM   Modules accepted: Orders

## 2017-10-22 NOTE — Telephone Encounter (Signed)
Pt says she is not having any fever, SOB, congestion. Patient is not having any other symptoms. She said every now and then she will cough up mucous but it is white, almost clear.

## 2017-10-22 NOTE — Telephone Encounter (Signed)
Referral placed.

## 2017-10-22 NOTE — Telephone Encounter (Signed)
Patient notified and would like referral

## 2017-10-22 NOTE — Telephone Encounter (Signed)
Tessalon sent to pharmacy for cough.  If cough does not improve or she develops new symptoms she should be evaluated.

## 2017-10-23 NOTE — Telephone Encounter (Signed)
Needs PA for Gabapentin

## 2017-10-29 ENCOUNTER — Other Ambulatory Visit: Payer: Self-pay | Admitting: Pain Medicine

## 2017-10-29 ENCOUNTER — Ambulatory Visit: Payer: Medicare HMO | Admitting: Pain Medicine

## 2017-10-29 DIAGNOSIS — Z91041 Radiographic dye allergy status: Secondary | ICD-10-CM

## 2017-10-29 MED ORDER — PREDNISONE 50 MG PO TABS: ORAL_TABLET | ORAL | 0 refills | Status: DC

## 2017-10-29 MED ORDER — DIPHENHYDRAMINE HCL 50 MG PO CAPS: ORAL_CAPSULE | ORAL | 0 refills | Status: DC

## 2017-10-30 DIAGNOSIS — M169 Osteoarthritis of hip, unspecified: Secondary | ICD-10-CM | POA: Insufficient documentation

## 2017-10-30 DIAGNOSIS — M5136 Other intervertebral disc degeneration, lumbar region: Secondary | ICD-10-CM | POA: Insufficient documentation

## 2017-10-30 DIAGNOSIS — M51369 Other intervertebral disc degeneration, lumbar region without mention of lumbar back pain or lower extremity pain: Secondary | ICD-10-CM | POA: Insufficient documentation

## 2017-10-30 NOTE — Progress Notes (Addendum)
Patient's Name: Maria Hamilton  MRN: [id number]  Referring Provider: Glori Luis, MD  DOB: November 17, 1951  PCP: Glori Luis, MD  DOS: 10/31/2017  Note by: Oswaldo Done, MD  Service setting: Ambulatory outpatient  Specialty: Interventional Pain Management  Patient type: Established  Location: [facility] (AMB) Pain Management Facility  Visit type: Interventional Procedure   Primary Reason for Visit: Interventional Pain Management Treatment. CC: Back Pain (low, left)  Procedure:  Anesthesia, Analgesia, Anxiolysis:  Type: Therapeutic Percutaneous Epidural Neuroplasty and Lysis of Adhesions (RACZ Procedure) Region: Caudal Level: Sacrococcygeal   Laterality: Midline aiming at the left  Type: Local Anesthesia with Moderate (Conscious) Sedation Local Anesthetic: Lidocaine 1% Route: Intravenous (IV) IV Access: Secured Sedation: Meaningful verbal contact was maintained at all times during the procedure  Indication(s): Analgesia and Anxiety   Indications: 1. Chronic low back pain (Location of Primary Source of Pain) (Left)   2. Chronic lumbar radicular pain (L5/S1 dermatome) (Location of Secondary source of pain) (Left)   3. Chronic lower extremity pain (Location of Secondary source of pain) (Left)   4. Failed back surgical syndrome (x 3)   5. Epidural fibrosis    Pain Score: Pre-procedure: 7 /10 Post-procedure: 2 /10  Pre-op Assessment:  Maria Hamilton is a 66 y.o. (year old), female patient, seen today for interventional treatment. She  has a past surgical history that includes Breast surgery; Back surgery; Ectopic pregnancy surgery; Hip surgery; Abdominal hysterectomy; cyst removal from wrist; and Cholecystectomy (N/A, 10/16/2016). Maria Hamilton has a current medication list which includes the following prescription(s): amlodipine, benzonatate, diphenhydramine, escitalopram, gabapentin, potassium chloride sa, pramipexole, prednisone, tizanidine, and vitamin d (ergocalciferol), and  the following Facility-Administered Medications: midazolam. Her primarily concern today is the Back Pain (low, left)  Initial Vital Signs: There were no vitals taken for this visit. BMI: Estimated body mass index is 26.78 kg/m as calculated from the following:   Height as of this encounter: 6\' 1"  (1.854 m).   Weight as of this encounter: 203 lb (92.1 kg).  Risk Assessment: Allergies: Reviewed. She is allergic to ace inhibitors; ciprofloxacin; fentanyl; latex; morphine and related; penicillin g benzathine; isovue m [iopamidol]; and tape.  Allergy Precautions: Pre-operative allergy-prevention protocol activated Coagulopathies: Reviewed. None identified.  Blood-thinner therapy: None at this time Active Infection(s): Reviewed. None identified. Maria Hamilton is afebrile  Site Confirmation: Maria Hamilton was asked to confirm the procedure and laterality before marking the site Procedure checklist: Completed Consent: Before the procedure and under the influence of no sedative(s), amnesic(s), or anxiolytics, the patient was informed of the treatment options, risks and possible complications. To fulfill our ethical and legal obligations, as recommended by the American Medical Association's Code of Ethics, I have informed the patient of my clinical impression; the nature and purpose of the treatment or procedure; the risks, benefits, and possible complications of the intervention; the alternatives, including doing nothing; the risk(s) and benefit(s) of the alternative treatment(s) or procedure(s); and the risk(s) and benefit(s) of doing nothing. The patient was provided information about the general risks and possible complications associated with the procedure. These may include, but are not limited to: failure to achieve desired goals, infection, bleeding, organ or nerve damage, allergic reactions, paralysis, and death. In addition, the patient was informed of those risks and complications associated to  Spine-related procedures, such as failure to decrease pain; infection (i.e.: Meningitis, epidural or intraspinal abscess); bleeding (i.e.: epidural hematoma, subarachnoid hemorrhage, or any other type of intraspinal or peri-dural bleeding); organ or nerve damage (  i.e.: Any type of peripheral nerve, nerve root, or spinal cord injury) with subsequent damage to sensory, motor, and/or autonomic systems, resulting in permanent pain, numbness, and/or weakness of one or several areas of the body; allergic reactions; (i.e.: anaphylactic reaction); and/or death. Furthermore, the patient was informed of those risks and complications associated with the medications. These include, but are not limited to: allergic reactions (i.e.: anaphylactic or anaphylactoid reaction(s)); adrenal axis suppression; blood sugar elevation that in diabetics may result in ketoacidosis or comma; water retention that in patients with history of congestive heart failure may result in shortness of breath, pulmonary edema, and decompensation with resultant heart failure; weight gain; swelling or edema; medication-induced neural toxicity; particulate matter embolism and blood vessel occlusion with resultant organ, and/or nervous system infarction; and/or aseptic necrosis of one or more joints. Finally, the patient was informed that Medicine is not an exact science; therefore, there is also the possibility of unforeseen or unpredictable risks and/or possible complications that may result in a catastrophic outcome. The patient indicated having understood very clearly. We have given the patient no guarantees and we have made no promises. Enough time was given to the patient to ask questions, all of which were answered to the patient's satisfaction. Maria Hamilton has indicated that she wanted to continue with the procedure. Attestation: I, the ordering provider, attest that I have discussed with the patient the benefits, risks, side-effects, alternatives,  likelihood of achieving goals, and potential problems during recovery for the procedure that I have provided informed consent. Date: 10/31/2017; Time: 7:35 PM  Pre-Procedure Preparation:  Monitoring: As per clinic protocol. Respiration, ETCO2, SpO2, BP, heart rate and rhythm monitor placed and checked for adequate function Safety Precautions: Patient was assessed for positional comfort and pressure points before starting the procedure. Time-out: I initiated and conducted the "Time-out" before starting the procedure, as per protocol. The patient was asked to participate by confirming the accuracy of the "Time Out" information. Verification of the correct person, site, and procedure were performed and confirmed by me, the nursing staff, and the patient. "Time-out" conducted as per Joint Commission's Universal Protocol (UP.01.01.01). "Time-out" Date & Time: 10/31/2017; 0950 hrs.  Description of Procedure Process:   Position: Prone  Target Area: Caudal Epidural Canal Approach: Midline approach Area Prepped: Entire Posterior Sacrococcygeal Region Prepping solution: ChloraPrep (2% chlorhexidine gluconate and 70% isopropyl alcohol) Safety Precautions: Aspiration looking for blood return was conducted prior to all injections. At no point did I inject any substances, as a needle was being advanced. No attempts were made at seeking any paresthesias. Safe injection practices and needle disposal techniques used. Medications properly checked for expiration dates. SDV (single dose vial) medications used. Description of the Procedure: Protocol guidelines were followed. The patient was placed in position over the fluoroscopy table. Patient assessed for comfort and pressure points before starting procedure. The target area was identified and the area prepped in the usual manner. Skin & deeper tissues infiltrated with local anesthetic. Appropriate amount of time allowed to pass for local anesthetics to take effect.  The epidural needle was then advanced to the target area, via the sacral hiatus, between the sacral cornu. Proper needle placement secured. Negative aspiration confirmed. Epidurogram performed by slowly injecting a non-ionic, water-soluble, hypoallergenic, myelogram-compatible radiological contrast. Defect identified and Racz catheter advanced slowly next to rest proximal to it without attempting to perforate or puncture the defect. At this point, the epidural needle was removed. Contrast was again injected, this time thru the catheter, under live  fluoroscopy, closely observing for vascular uptake or intrathecal spread. Once no vascular uptake or intrathecal spread was confirmed, a 2 mL test-dose using 1.5-2% PF-Lidocaine with 1:200,000 Epinephrine was injected thru the catheter, while closely monitoring for an increase in heart rate or evidence of spinal anesthesia. After waiting over 5 minutes, the patient was assessed for evidence of a spinal block. Once I had confirmed that there was no vascular uptake or evidence of intrathecal spread, I then slowly injected 1,500 Units of hyaluronidase, carefully monitoring for allergic reactions. I then waited 10 minutes, using this time to secure the catheter using a sterile benzoin tincture swab and (12mm x 100 mm) steri-strip. After the 10 minutes, I proceeded to slowly inject a solution containing 0.2% MPF-Ropivacaine (9 mL) + SDV Triamcinolone 40 mg/mL (1 mL), in intermittent fashion, asking for systemic symptoms every 0.5cc of injectate. After confirming vitals to be stable, the patient was transferred to the recovery area where Ms. Annand was kept under constant observation and monitored as per post-sedation protocol. 30 minutes later, the patient was neurologically assessed for any evidence of a spinal blockade.  After confirming the absence of anesthesia, I slowly injected the 10% PF-Hypertonic Saline, stopping to assess, any time the patient described any  discomfort. Once the procedure was completed, I removed the catheter, taking time to show the patient its spring tip, and demonstrating to the patient that none had been left behind. EBL: None Materials & Medications used:  1. Racz Tun-L-Kath ([place], [place], [place]) Catheter. (or similar)(Perifix 20Gx100cm) 2. 2 Foam Tape 3. Benzoin tincture swab 4. Steri-Strip (12mm x 100 mm) 5. Non-occlusive Transparent Dressing (22M Tegaderm) 6. Bacteriostatic Filter for Epidural Catheter 7. Epidural Kit for 20G catheter Needle(s) used: 20g - 10cm, Tuohy-style epidural needle Medications used:  1. Skin infiltration: 2.0% Lidocaine (10ml) 2. Test-dose: 1.5 % Lidocaine w/ Epi 1:200,000 (5ml) 3. Epidural Steroid: Triamcinolone 40 mg/mL (SDV) (1ml) 4. Epidural Local Anesthetic: 0.2% PF-Ropivacaine (2 mg/mL) (5 mL) 5. Epidural dilution agent: 0.9% PF-NSS (injectable saline) (4 mL) 6. Neurolytic Agent: 10% PF-Sodium Chloride (Hypertonic Saline) (10ml) [23.4% PF-Sodium Chloride (4.247mL) + PF-Sterile H2O (5.757mL) = 10% PF-Sodium Chloride (13mL)] 7. Scar softening agent: Hyaluronidase 1500 Units (Wydase) 8. Radiological Contrast Media: Isovue-M 200 (15ml) Vitals:   10/31/17 1125 10/31/17 1135 10/31/17 1145 10/31/17 1155  BP: (!) 148/77 (!) 170/90 (!) 169/96 (!) 163/87  Pulse:      Resp: 20 20 20 20   Temp: 97.6 F (36.4 C)   (!) 97.5 F (36.4 C)  SpO2: 97% 100% 99% 99%  Weight:      Height:        Start Time: 0952 hrs. End Time: 1011 hrs. Epidurogram:  Epidurogram flow pattern demonstrated a restricted low at the level of the surgery. The epidural catheter was placed next to this restriction without attempting to perforate it.  Materials:  Needle(s) Type: Epidural needle Gauge: 20G Length: 3.5-in Medication(s): We administered lactated ringers, gentamicin (GARAMYCIN) 120 mg in dextrose 5 % 50 mL IVPB, midazolam, lidocaine, lidocaine-EPINEPHrine, ropivacaine (PF) 2 mg/mL (0.2%), triamcinolone  acetonide, iopamidol, sodium chloride flush, hyaluronidase Human, and sodium chloride hypertonic 10% epidural injection. Please see chart orders for dosing details.  Imaging Guidance (Spinal):  Type of Imaging Technique: Fluoroscopy Guidance (Spinal) Indication(s): Assistance in needle guidance and placement for procedures requiring needle placement in or near specific anatomical locations not easily accessible without such assistance. Exposure Time: Please see nurses notes. Contrast: Before injecting any contrast, we confirmed that the patient did  not have an allergy to iodine, shellfish, or radiological contrast. Once satisfactory needle placement was completed at the desired level, radiological contrast was injected. Contrast injected under live fluoroscopy. No contrast complications. See chart for type and volume of contrast used. Fluoroscopic Guidance: I was personally present during the use of fluoroscopy. "Tunnel Vision Technique" used to obtain the best possible view of the target area. Parallax error corrected before commencing the procedure. "Direction-depth-direction" technique used to introduce the needle under continuous pulsed fluoroscopy. Once target was reached, antero-posterior, oblique, and lateral fluoroscopic projection used confirm needle placement in all planes. Images permanently stored in EMR. Interpretation: I personally interpreted the imaging intraoperatively. Adequate needle placement confirmed in multiple planes. Appropriate spread of contrast into desired area was observed. No evidence of afferent or efferent intravascular uptake. No intrathecal or subarachnoid spread observed. Permanent images saved into the patient's record.  Antibiotic Prophylaxis:  Indication(s): Surgical Prophylaxis. Antibiotic given: gentamicin (GARAMYCIN) 120 mg IV  Post-operative Assessment:  EBL: None Complications: No immediate post-treatment complications observed by team, or reported by  patient. Note: The patient tolerated the entire procedure well. A repeat set of vitals were taken after the procedure and the patient was kept under observation following institutional policy, for this type of procedure. Post-procedural neurological assessment was performed, showing return to baseline, prior to discharge. The patient was provided with post-procedure discharge instructions, including a section on how to identify potential problems. Should any problems arise concerning this procedure, the patient was given instructions to immediately contact us, at any time, without hesitation. In any case, we plan to contact the patient by telephone for a follow-up status report regarding this interventional procedure. Comments:  No additional relevant information.  Plan of Care    Imaging Orders     DG C-Arm 1-60 Min-No Report  Procedure Orders     Racz (One Day)  Medications ordered for procedure: Meds ordered this encounter  Medications   lactated ringers infusion 1,000 mL   gentamicin (GARAMYCIN) 120 mg in dextrose 5 % 50 mL IVPB     Order Specific Question:   Antibiotic Indication:    Answer:   Surgical Prophylaxis   midazolam (VERSED) 5 MG/5ML injection 1-2 mg    Make sure Flumazenil is available in the pyxis when using this medication. If oversedation occurs, administer 0.2 mg IV over 15 sec. If after 45 sec no response, administer 0.2 mg again over 1 min; may repeat at 1 min intervals; not to exceed 4 doses (1 mg)   lidocaine (XYLOCAINE) 2 % (with pres) injection 400 mg   lidocaine-EPINEPHrine 1.5 %-1:200000 injection 10 mL    From [facility]   ropivacaine (PF) 2 mg/mL (0.2%) (NAROPIN) injection 5 mL   triamcinolone acetonide (KENALOG-40) injection (RADIOLOGY ONLY) 40 mg   iopamidol (ISOVUE-M) 41 % intrathecal injection 15 mL   sodium chloride flush (NS) 0.9 % injection 4 mL   hyaluronidase Human (HYLENEX) injection 1,500 Units    10 mL of the 150 Units/mL    sodium chloride hypertonic 10% epidural injection    For Racz Epidural Lysis of Adhesions.   Medications administered: We administered lactated ringers, gentamicin (GARAMYCIN) 120 mg in dextrose 5 % 50 mL IVPB, midazolam, lidocaine, lidocaine-EPINEPHrine, ropivacaine (PF) 2 mg/mL (0.2%), triamcinolone acetonide, iopamidol, sodium chloride flush, hyaluronidase Human, and sodium chloride hypertonic 10% epidural injection.  See the medical record for exact dosing, route, and time of administration.  This SmartLink is deprecated. Use AVSMEDLIST instead to display the medication  list for a patient. Disposition: Discharge home  Discharge Date & Time: 10/31/2017; 1200 hrs.   Physician-requested Follow-up: Return for post-procedure eval by Dr. Laban Emperor in 2 wks. Future Appointments  Date Time Provider Department Center  11/18/2017  2:00 PM Delano Metz, MD [facility] None  12/03/2017  1:30 PM Midge Minium, MD [facility] None  01/09/2018 10:30 AM Barbette Merino, NP [facility] None   Primary Care Physician: Glori Luis, MD Location: [facility] Note by: Oswaldo Done, MD Date: 10/31/2017; Time: 12:35 PM  Disclaimer:  Medicine is not an exact science. The only guarantee in medicine is that nothing is guaranteed. It is important to note that the decision to proceed with this intervention was based on the information collected from the patient. The Data and conclusions were drawn from the patient's questionnaire, the interview, and the physical examination. Because the information was provided in large part by the patient, it cannot be guaranteed that it has not been purposely or unconsciously manipulated. Every effort has been made to obtain as much relevant data as possible for this evaluation. It is important to note that the conclusions that lead to this procedure are derived in large part from the available data. Always take into account that the treatment  will also be dependent on availability of resources and existing treatment guidelines, considered by other Pain Management Practitioners as being common knowledge and practice, at the time of the intervention. For Medico-Legal purposes, it is also important to point out that variation in procedural techniques and pharmacological choices are the acceptable norm. The indications, contraindications, technique, and results of the above procedure should only be interpreted and judged by a Board-Certified Interventional Pain Specialist with extensive familiarity and expertise in the same exact procedure and technique.

## 2017-10-31 ENCOUNTER — Other Ambulatory Visit: Payer: Self-pay

## 2017-10-31 ENCOUNTER — Encounter: Payer: Self-pay | Admitting: Pain Medicine

## 2017-10-31 ENCOUNTER — Ambulatory Visit (HOSPITAL_BASED_OUTPATIENT_CLINIC_OR_DEPARTMENT_OTHER): Payer: Medicare HMO | Admitting: Pain Medicine

## 2017-10-31 ENCOUNTER — Ambulatory Visit
Admission: RE | Admit: 2017-10-31 | Discharge: 2017-10-31 | Disposition: A | Discharge status: Home / Self Care | Payer: Medicare HMO | Source: Ambulatory Visit | Attending: Pain Medicine | Admitting: Pain Medicine

## 2017-10-31 ENCOUNTER — Telehealth: Payer: Self-pay

## 2017-10-31 VITALS — BP 163/87 | HR 80 | Temp 97.5°F | Resp 20 | Ht 73.0 in | Wt 203.0 lb

## 2017-10-31 DIAGNOSIS — Z91040 Latex allergy status: Secondary | ICD-10-CM | POA: Diagnosis not present

## 2017-10-31 DIAGNOSIS — G9619 Other disorders of meninges, not elsewhere classified: Secondary | ICD-10-CM

## 2017-10-31 DIAGNOSIS — M545 Low back pain: Secondary | ICD-10-CM | POA: Diagnosis not present

## 2017-10-31 DIAGNOSIS — Z91041 Radiographic dye allergy status: Secondary | ICD-10-CM | POA: Insufficient documentation

## 2017-10-31 DIAGNOSIS — Z7952 Long term (current) use of systemic steroids: Secondary | ICD-10-CM | POA: Insufficient documentation

## 2017-10-31 DIAGNOSIS — M961 Postlaminectomy syndrome, not elsewhere classified: Secondary | ICD-10-CM | POA: Diagnosis not present

## 2017-10-31 DIAGNOSIS — Z90710 Acquired absence of both cervix and uterus: Secondary | ICD-10-CM | POA: Diagnosis not present

## 2017-10-31 DIAGNOSIS — Z888 Allergy status to other drugs, medicaments and biological substances status: Secondary | ICD-10-CM | POA: Diagnosis not present

## 2017-10-31 DIAGNOSIS — Z885 Allergy status to narcotic agent status: Secondary | ICD-10-CM | POA: Insufficient documentation

## 2017-10-31 DIAGNOSIS — Z79899 Other long term (current) drug therapy: Secondary | ICD-10-CM | POA: Insufficient documentation

## 2017-10-31 DIAGNOSIS — Z880 Allergy status to penicillin: Secondary | ICD-10-CM | POA: Insufficient documentation

## 2017-10-31 DIAGNOSIS — G8929 Other chronic pain: Secondary | ICD-10-CM

## 2017-10-31 DIAGNOSIS — Z881 Allergy status to other antibiotic agents status: Secondary | ICD-10-CM | POA: Diagnosis not present

## 2017-10-31 DIAGNOSIS — M5416 Radiculopathy, lumbar region: Secondary | ICD-10-CM | POA: Diagnosis not present

## 2017-10-31 DIAGNOSIS — M5442 Lumbago with sciatica, left side: Secondary | ICD-10-CM

## 2017-10-31 DIAGNOSIS — Z9049 Acquired absence of other specified parts of digestive tract: Secondary | ICD-10-CM | POA: Diagnosis not present

## 2017-10-31 DIAGNOSIS — M79605 Pain in left leg: Secondary | ICD-10-CM

## 2017-10-31 DIAGNOSIS — G96198 Other disorders of meninges, not elsewhere classified: Secondary | ICD-10-CM

## 2017-10-31 MED ORDER — IOPAMIDOL (ISOVUE-M 200) INJECTION 41%
15.0000 mL | Freq: Once | INTRAMUSCULAR | Status: AC
  Administered 2017-10-31: 10 mL via EPIDURAL

## 2017-10-31 MED ORDER — LIDOCAINE-EPINEPHRINE (PF) 1.5 %-1:200000 IJ SOLN
10.0000 mL | Freq: Once | INTRAMUSCULAR | Status: AC
  Administered 2017-10-31: 2 mL

## 2017-10-31 MED ORDER — TRIAMCINOLONE ACETONIDE 40 MG/ML IJ SUSP (RADIOLOGY)
40.0000 mg | Freq: Once | INTRAMUSCULAR | Status: AC
  Administered 2017-10-31: 40 mg via EPIDURAL
  Filled 2017-10-31: qty 1

## 2017-10-31 MED ORDER — ROPIVACAINE HCL 2 MG/ML IJ SOLN
5.0000 mL | Freq: Once | INTRAMUSCULAR | Status: AC
  Administered 2017-10-31: 5 mL via EPIDURAL
  Filled 2017-10-31: qty 10

## 2017-10-31 MED ORDER — SODIUM CHLORIDE 0.9% FLUSH
4.0000 mL | Freq: Once | INTRAVENOUS | Status: AC
  Administered 2017-10-31: 4 mL

## 2017-10-31 MED ORDER — LIDOCAINE HCL 2 % IJ SOLN
20.0000 mL | Freq: Once | INTRAMUSCULAR | Status: AC
  Administered 2017-10-31: 400 mg
  Filled 2017-10-31: qty 40

## 2017-10-31 MED ORDER — STERILE WATER FOR INJECTION IJ SOLN
10.0000 mL | Freq: Once | INTRAMUSCULAR | Status: AC
  Administered 2017-10-31: 10 mL via EPIDURAL
  Filled 2017-10-31: qty 4.27

## 2017-10-31 MED ORDER — LACTATED RINGERS IV SOLN
1000.0000 mL | Freq: Once | INTRAVENOUS | Status: AC
  Administered 2017-10-31: 1000 mL via INTRAVENOUS

## 2017-10-31 MED ORDER — HYALURONIDASE HUMAN 150 UNIT/ML IJ SOLN
1500.0000 [IU] | Freq: Once | INTRAMUSCULAR | Status: AC
Start: 2017-10-31 — End: 2017-10-31
  Administered 2017-10-31: 1500 [IU] via INTRADERMAL
  Filled 2017-10-31: qty 10

## 2017-10-31 MED ORDER — GENTAMICIN SULFATE 40 MG/ML IJ SOLN
120.0000 mg | Freq: Once | INTRAVENOUS | Status: AC
  Administered 2017-10-31: 120 mg via INTRAVENOUS
  Filled 2017-10-31: qty 3

## 2017-10-31 MED ORDER — MIDAZOLAM HCL 5 MG/5ML IJ SOLN
1.0000 mg | INTRAMUSCULAR | Status: DC | PRN
Start: 2017-10-31 — End: 2017-10-31
  Administered 2017-10-31: 4 mg via INTRAVENOUS
  Filled 2017-10-31: qty 5

## 2017-10-31 NOTE — Telephone Encounter (Signed)
I spoke with Maria Hamilton to make sure she had voided after her procedure today. She said that she did and all is well.

## 2017-10-31 NOTE — Patient Instructions (Signed)

## 2017-11-01 ENCOUNTER — Telehealth: Payer: Self-pay

## 2017-11-01 NOTE — Telephone Encounter (Signed)
Post procedure phone call. Patient states she is doing good.  

## 2017-11-11 ENCOUNTER — Ambulatory Visit: Payer: Self-pay | Admitting: *Deleted

## 2017-11-11 NOTE — Telephone Encounter (Signed)
  Pt c/o weakness and unbalance on feet. States does not feel well. It started a week ago but has gotten worse. Has a hx of high B/P and takes medication for that. Checking b/p now 153/80 and 150/56, five  mins later. Care advice given to patient.  Appt made.  Reason for Disposition . [1] MILD dizziness (e.g., walking normally) AND [2] has been evaluated by physician for this  Answer Assessment - Initial Assessment Questions 1. SYMPTOM: "What is the main symptom you are concerned about?" (e.g., weakness, numbness)     weakness 2. ONSET: "When did this start?" (minutes, hours, days; while sleeping)     yesterday 3. LAST NORMAL: "When was the last time you were normal (no symptoms)?"     The first of last week 4. PATTERN "Does this come and go, or has it been constant since it started?"  "Is it present now?"     All the time 5. CARDIAC SYMPTOMS: "Have you had any of the following symptoms: chest pain, difficulty breathing, palpitations?"     no 6. NEUROLOGIC SYMPTOMS: "Have you had any of the following symptoms: headache, dizziness, vision loss, double vision, changes in speech, unsteady on your feet?"     Slight headache, unsteady on feet 7. OTHER SYMPTOMS: "Do you have any other symptoms?"     no 8. PREGNANCY: "Is there any chance you are pregnant?" "When was your last menstrual period?"     no  Protocols used: DIZZINESS - LIGHTHEADEDNESS-A-AH, NEUROLOGIC DEFICIT-A-AH

## 2017-11-13 ENCOUNTER — Telehealth: Payer: Self-pay | Admitting: *Deleted

## 2017-11-13 NOTE — Telephone Encounter (Signed)
Talked with patient she is still feeling dizzy and weak advised patient she does not need to wait until Monday to be evaluated and she is going to go to [employer] in .

## 2017-11-13 NOTE — Telephone Encounter (Signed)
-----   Message from Delano Metz, [place] sent at 11/12/2017  2:19 PM EST ----- Maria Hamilton,  Thank you for your help yesterday. I appreciate all that you do. Do you know if someone will go ahead and schedule this patient even at another site? The triage note is concerning and she is scheduled for a 15 minute appointment only.  I remember you mentioned this yesterday, but I do not know what was decided.  Thanks so much, Almyra Free

## 2017-11-17 NOTE — Progress Notes (Deleted)
Patient's Name: Maria Hamilton  MRN: [id number]  Referring Provider: Glori Luis, MD  DOB: 05/14/1951  PCP: Glori Luis, MD  DOS: 11/18/2017  Note by: Oswaldo Done, MD  Service setting: Ambulatory outpatient  Specialty: Interventional Pain Management  Location: [facility] (AMB) Pain Management Facility    Patient type: Established   Primary Reason(s) for Visit: Encounter for prescription drug management & post-procedure evaluation of chronic illness with mild to moderate exacerbation(Level of risk: moderate) CC: No chief complaint on file.  HPI  Maria Hamilton is a 66 y.o. year old, female patient, who comes today for a post-procedure evaluation and medication management. She has GERD (gastroesophageal reflux disease); Rheumatoid factor positive; Vitamin D deficiency; Insomnia; Fibromyalgia; Chronic low back pain (Location of Primary Source of Pain) (Left); Chronic pain syndrome; Depression; Hypertension; 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); Long term current use of opiate analgesic; Long term prescription opiate use; Opiate use; Encounter for therapeutic drug level monitoring; Encounter for pain management planning; Rheumatoid arthritis, multiple sites (positive RF); Chronic lower extremity pain (Location of Secondary source of pain) (Left); Lumbar facet arthropathy (Bilateral); Failed back surgical syndrome (x 3); Epidural fibrosis; Neurogenic pain; Musculoskeletal pain; Chronic lumbar radicular pain (L5/S1 dermatome) (Location of Secondary source of pain) (Left); History of total hip replacement, bilateral; Hypokalemia; Constipation; Therapeutic opioid-induced constipation (OIC); Palpitations; Epigastric pain; Memory difficulty; Polyneuropathy; Post herpetic neuralgia; Abnormal MRI, lumbar spine (03/06/2017); 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 Her primarily concern today is the No chief complaint on file.  Pain Assessment: Location:     Radiating:   Onset:   Duration:   Quality:   Severity:  /10 (self-reported pain score)  Note: Reported level is compatible with observation.                         When using our objective Pain Scale, levels between 6 and 10/10 are said to belong in an emergency room, as it progressively worsens from a 6/10, described as severely limiting, requiring emergency care not usually available at an outpatient pain management facility. At a 6/10 level, communication becomes difficult and requires great effort. Assistance to reach the emergency department may be required. Facial flushing and profuse sweating along with potentially dangerous increases in heart rate and blood pressure will be evident. Effect on ADL:   Timing:   Modifying factors:    Maria Hamilton was last seen on 10/31/2017 for a procedure. During today's appointment we reviewed Maria Hamilton post-procedure results, as well as her outpatient medication regimen.  Further details on both, my assessment(s), as well as the proposed treatment plan, please see below.  Controlled Substance Pharmacotherapy Assessment REMS (Risk Evaluation and Mitigation Strategy)  Analgesic: Tramadol 100 mg 4 times a day (400 mg/dayof tramadol) The patient ran out of medication on 01/19/17. MME/day:40mg /day. No notes on file Pharmacokinetics: Liberation and absorption (onset of action): WNL Distribution (time to peak effect): WNL Metabolism and excretion (duration of action): WNL         Pharmacodynamics: Desired effects: Analgesia: Maria Hamilton reports >50% benefit. Functional ability: Patient reports  that medication allows her to accomplish basic ADLs Clinically meaningful improvement in function (  CMIF): Sustained CMIF goals met Perceived effectiveness: Described as relatively effective, allowing for increase in activities of daily living (ADL) Undesirable effects: Side-effects or Adverse reactions: None reported Monitoring:  PMP: Online review of the past 33-month period conducted. Compliant with practice rules and regulations Last UDS on record: Summary  Date Value Ref Range Status  09/24/2016 FINAL  Final    Comment:    ==================================================================== [facility] COMP DRUG ANALYSIS,UR ==================================================================== Test                             Result       Flag       Units Drug Present and Declared for Prescription Verification   Hydrocodone                    1294         EXPECTED   ng/mg creat   Hydromorphone                  104          EXPECTED   ng/mg creat   Dihydrocodeine                 420          EXPECTED   ng/mg creat   Norhydrocodone                 >1142        EXPECTED   ng/mg creat    Sources of hydrocodone include scheduled prescription    medications. Hydromorphone, dihydrocodeine and norhydrocodone are    expected metabolites of hydrocodone. Hydromorphone and    dihydrocodeine are also available as scheduled prescription    medications.   Oxymorphone                    39           EXPECTED   ng/mg creat   Noroxymorphone                 19           EXPECTED   ng/mg creat    Sources of oxymorphone are scheduled prescription medications;    oxymorphone is also a metabolite of oxycodone. Noroxymorphone is    an expected metabolite of oxymorphone.   Citalopram                     PRESENT      EXPECTED   Desmethylcitalopram            PRESENT      EXPECTED    Desmethylcitalopram is an expected metabolite of citalopram or    the enantiomeric form, escitalopram.   Acetaminophen                   PRESENT      EXPECTED Drug Present not Declared for Prescription Verification   Orphenadrine                   PRESENT      UNEXPECTED   Ibuprofen                      PRESENT      UNEXPECTED Drug Absent but Declared for Prescription Verification   Oxycodone  Not Detected UNEXPECTED ng/mg creat    Oxycodone is almost always present in patients taking this drug    consistently.  Absence of oxycodone could be due to lapse of time    since the last dose or unusual pharmacokinetics (rapid    metabolism).   Hydroxyzine                    Not Detected UNEXPECTED ==================================================================== Test                      Result    Flag   Units      Ref Range   Creatinine              438              mg/dL      >=57 ==================================================================== Declared Medications:  The flagging and interpretation on this report are based on the  following declared medications.  Unexpected results may arise from  inaccuracies in the declared medications.  **Note: The testing scope of this panel includes these medications:  Citalopram (Lexapro)  Hydrocodone (Norco)  Hydroxyzine (Atarax)  Oxycodone (Percocet)  **Note: The testing scope of this panel does not include small to  moderate amounts of these reported medications:  Acetaminophen (Norco)  Acetaminophen (Percocet)  **Note: The testing scope of this panel does not include following  reported medications:  Amlodipine (Norvasc)  Hydrochlorothiazide (Hydrodiuril)  Meclizine (Antivert)  Methylprednisolone (Medrol Dose Pack)  Potassium ==================================================================== For clinical consultation, please call [phone]. ====================================================================    UDS interpretation: Compliant          Medication Assessment Form: Reviewed. Patient indicates being compliant with  therapy Treatment compliance: Compliant Risk Assessment Profile: Aberrant behavior: See prior evaluations. None observed or detected today Comorbid factors increasing risk of overdose: See prior notes. No additional risks detected today Risk of substance use disorder (SUD): Low Opioid Risk Tool - 10/09/17 0909      Family History of Substance Abuse   Alcohol  Negative    Illegal Drugs  Negative    Rx Drugs  Negative      Personal History of Substance Abuse   Alcohol  Negative    Illegal Drugs  Negative    Rx Drugs  Negative      Psychological Disease   Psychological Disease  Positive    ADD  Negative    OCD  Negative    Bipolar  Negative    Schizophrenia  Negative    Depression  Positive patient taking lexapro but states that her PCP is going to change because it is not working for her.    patient taking lexapro but states that her PCP is going to change because it is not working for her.      Total Score   Opioid Risk Tool Scoring  3    Opioid Risk Interpretation  Low Risk      ORT Scoring interpretation table:  Score <3 = Low Risk for SUD  Score between 4-7 = Moderate Risk for SUD  Score >8 = High Risk for Opioid Abuse   Risk Mitigation Strategies:  Patient Counseling: Covered Patient-Prescriber Agreement (PPA): Present and active  Notification to other healthcare providers: Done  Pharmacologic Plan: No change in therapy, at this time  Post-Procedure Assessment  10/31/2017 Procedure: RACZepidurolysis of adhesions Pre-procedure pain score:  7/10 Post-procedure pain score: 2/10 (> 50% relief) Influential Factors: BMI:   Intra-procedural challenges: None observed.  Assessment challenges: None detected.              Reported side-effects: None.        Post-procedural adverse reactions or complications: None reported         Sedation: Sedation provided. When no sedatives are used, the analgesic levels obtained are directly associated to the effectiveness of  the local anesthetics. However, when sedation is provided, the level of analgesia obtained during the initial 1 hour following the intervention, is believed to be the result of a combination of factors. These factors may include, but are not limited to: 1. The effectiveness of the local anesthetics used. 2. The effects of the analgesic(s) and/or anxiolytic(s) used. 3. The degree of discomfort experienced by the patient at the time of the procedure. 4. The patients ability and reliability in recalling and recording the events. 5. The presence and influence of possible secondary gains and/or psychosocial factors. Reported result: Relief experienced during the 1st hour after the procedure:   (Ultra-Short Term Relief)            Interpretative annotation: Clinically appropriate result. Analgesia during this period is likely to be Local Anesthetic and/or IV Sedative (Analgesic/Anxiolytic) related.          Effects of local anesthetic: The analgesic effects attained during this period are directly associated to the localized infiltration of local anesthetics and therefore cary significant diagnostic value as to the etiological location, or anatomical origin, of the pain. Expected duration of relief is directly dependent on the pharmacodynamics of the local anesthetic used. Long-acting (4-6 hours) anesthetics used.  Reported result: Relief during the next 4 to 6 hour after the procedure:   (Short-Term Relief)            Interpretative annotation: Clinically appropriate result. Analgesia during this period is likely to be Local Anesthetic-related.          Long-term benefit: Defined as the period of time past the expected duration of local anesthetics (1 hour for short-acting and 4-6 hours for long-acting). With the possible exception of prolonged sympathetic blockade from the local anesthetics, benefits during this period are typically attributed to, or associated with, other factors such as analgesic sensory  neuropraxia, antiinflammatory effects, or beneficial biochemical changes provided by agents other than the local anesthetics.  Reported result: Extended relief following procedure:   (Long-Term Relief)            Interpretative annotation: Clinically appropriate result. Good relief. No permanent benefit expected. Inflammation plays a part in the etiology to the pain.          Current benefits: Defined as reported results that persistent at this point in time.   Analgesia: *** %            Function: Somewhat improved ROM: Somewhat improved Interpretative annotation: Recurrence of symptoms. No permanent benefit expected. Effective diagnostic intervention.          Interpretation: Results would suggest a successful diagnostic intervention.                  Plan:  Please see "Plan of Care" for details.        Laboratory Chemistry  Inflammation Markers (CRP: Acute Phase) (ESR: Chronic Phase) Lab Results  Component Value Date   CRP <0.8 09/25/2016   ESRSEDRATE 17 08/13/2017   LATICACIDVEN 1.0 10/24/2016                 Rheumatology Markers No results found for: RF, ANA,  Jerrilyn [place], [facility], [facility]              Renal Function Markers Lab Results  Component Value Date   BUN 8 08/13/2017   CREATININE 0.76 08/13/2017   GFRAA >60 11/15/2016   GFRNONAA >60 11/15/2016                 Hepatic Function Markers Lab Results  Component Value Date   AST 16 08/13/2017   ALT 8 08/13/2017   ALBUMIN 4.1 08/13/2017   ALKPHOS 93 08/13/2017   LIPASE 9.0 (L) 11/26/2016                 Electrolytes Lab Results  Component Value Date   NA 139 08/13/2017   K 3.2 (L) 08/22/2017   CL 101 08/13/2017   CALCIUM 9.7 08/13/2017   MG 1.9 09/25/2016                 Neuropathy Markers Lab Results  Component Value Date   VITAMINB12 446 03/06/2017   HIV NONREACTIVE 03/06/2017                 Bone Pathology Markers Lab Results  Component Value Date   25OHVITD1 14 (L) 09/25/2016    25OHVITD2 6.4 09/25/2016   25OHVITD3 7.7 09/25/2016                 Coagulation Parameters Lab Results  Component Value Date   PLT 313.0 08/13/2017                 Cardiovascular Markers Lab Results  Component Value Date   TROPONINI <0.03 07/13/2016   HGB 13.0 08/13/2017   HCT 40.0 08/13/2017                 CA Markers No results found for: CEA, CA125, LABCA2               Note: Lab results reviewed.  Recent Diagnostic Imaging Results  DG C-Arm 1-60 Min-No Report Fluoroscopy was utilized by the requesting physician.  No radiographic  interpretation.   Complexity Note: Imaging results reviewed. Results shared with Ms. Sciarra, using Layman's terms.                         Meds   Current Outpatient Medications:  .  amLODipine (NORVASC) 10 MG tablet, Take 1 tablet (10 mg total) by mouth daily., Disp: 90 tablet, Rfl: 3 .  benzonatate (TESSALON) 200 MG capsule, Take 1 capsule (200 mg total) 2 (two) times daily as needed by mouth for cough., Disp: 20 capsule, Rfl: 0 .  diphenhydrAMINE (BENADRYL) 50 MG capsule, Take 50 mg by mouth 1 hour before procedure. Must have a driver., Disp: 10 capsule, Rfl: 0 .  escitalopram (LEXAPRO) 20 MG tablet, Take by mouth., Disp: , Rfl:  .  gabapentin (NEURONTIN) 300 MG capsule, Take 1-3 capsules (300-900 mg total) by mouth 4 (four) times daily. Follow titration schedule., Disp: 360 capsule, Rfl: 5 .  potassium chloride SA (K-DUR,KLOR-CON) 20 MEQ tablet, , Disp: , Rfl: 0 .  pramipexole (MIRAPEX) 0.5 MG tablet, Take 1 tablet (0.5 mg total) by mouth 3 (three) times daily., Disp: 30 tablet, Rfl: 2 .  predniSONE (DELTASONE) 50 MG tablet, Take 1 tab PO 13 hrs, 7 hrs, and 1 hr before contrast media injection (procedure)., Disp: 10 tablet, Rfl: 0 .  tizanidine (ZANAFLEX) 2 MG capsule, Take 1 capsule (2 mg total) by mouth 3 (three)  times daily as needed for muscle spasms., Disp: 90 capsule, Rfl: 5 .  Vitamin D, Ergocalciferol, (DRISDOL) 50000 units CAPS  capsule, Take by mouth., Disp: , Rfl:   ROS  Constitutional: Denies any fever or chills Gastrointestinal: No reported hemesis, hematochezia, vomiting, or acute GI distress Musculoskeletal: Denies any acute onset joint swelling, redness, loss of ROM, or weakness Neurological: No reported episodes of acute onset apraxia, aphasia, dysarthria, agnosia, amnesia, paralysis, loss of coordination, or loss of consciousness  Allergies  Ms. Heggie is allergic to ace inhibitors; ciprofloxacin; fentanyl; latex; morphine and related; penicillin g benzathine; isovue m [iopamidol]; and tape.  PFSH  Drug: Ms. Yslas  reports that she does not use drugs. Alcohol:  reports that she does not drink alcohol. Tobacco:  reports that  has never smoked. she has never used smokeless tobacco. Medical:  has a past medical history of Acute anxiety (08/03/2015), Acute bronchitis (12/31/2016), Acute pancreatitis (10/14/2016), Altered mental status (10/12/2016), Anxiety, Chronic hip pain, Chronic pain syndrome, Depression, Edema, Fatigue, Fibromyalgia, GERD (gastroesophageal reflux disease), Headache, Hypertension, Insomnia, Low back pain, Right upper quadrant abdominal pain, and Vitamin D deficiency. Surgical: Ms. Barch  has a past surgical history that includes Breast surgery; Back surgery; Ectopic pregnancy surgery; Hip surgery; Abdominal hysterectomy; cyst removal from wrist; and Cholecystectomy (N/A, 10/16/2016). Family: family history includes Alcohol abuse in her father; Cancer in her father; Heart disease in her mother.  Constitutional Exam  General appearance: Well nourished, well developed, and well hydrated. In no apparent acute distress There were no vitals filed for this visit. BMI Assessment: Estimated body mass index is 26.78 kg/m as calculated from the following:   Height as of 10/31/17: 6\' 1"  (1.854 m).   Weight as of 10/31/17: 203 lb (92.1 kg).  BMI interpretation table: BMI level Category Range  association with higher incidence of chronic pain  <18 kg/m2 Underweight   18.5-24.9 kg/m2 Ideal body weight   25-29.9 kg/m2 Overweight Increased incidence by 20%  30-34.9 kg/m2 Obese (Class I) Increased incidence by 68%  35-39.9 kg/m2 Severe obesity (Class II) Increased incidence by 136%  >40 kg/m2 Extreme obesity (Class III) Increased incidence by 254%   BMI Readings from Last 4 Encounters:  10/31/17 26.78 kg/m  10/16/17 27.20 kg/m  10/09/17 26.12 kg/m  09/03/17 26.39 kg/m   Wt Readings from Last 4 Encounters:  10/31/17 203 lb (92.1 kg)  10/16/17 206 lb 3.2 oz (93.5 kg)  10/09/17 198 lb (89.8 kg)  09/03/17 200 lb (90.7 kg)  Psych/Mental status: Alert, oriented x 3 (person, place, & time)       Eyes: PERLA Respiratory: No evidence of acute respiratory distress  Cervical Spine Area Exam  Skin & Axial Inspection: No masses, redness, edema, swelling, or associated skin lesions Alignment: Symmetrical Functional ROM: Unrestricted ROM      Stability: No instability detected Muscle Tone/Strength: Functionally intact. No obvious neuro-muscular anomalies detected. Sensory (Neurological): Unimpaired Palpation: No palpable anomalies              Upper Extremity (UE) Exam    Side: Right upper extremity  Side: Left upper extremity  Skin & Extremity Inspection: Skin color, temperature, and hair growth are WNL. No peripheral edema or cyanosis. No masses, redness, swelling, asymmetry, or associated skin lesions. No contractures.  Skin & Extremity Inspection: Skin color, temperature, and hair growth are WNL. No peripheral edema or cyanosis. No masses, redness, swelling, asymmetry, or associated skin lesions. No contractures.  Functional ROM: Unrestricted ROM  Functional ROM: Unrestricted ROM          Muscle Tone/Strength: Functionally intact. No obvious neuro-muscular anomalies detected.  Muscle Tone/Strength: Functionally intact. No obvious neuro-muscular anomalies detected.   Sensory (Neurological): Unimpaired          Sensory (Neurological): Unimpaired          Palpation: No palpable anomalies              Palpation: No palpable anomalies              Specialized Test(s): Deferred         Specialized Test(s): Deferred          Thoracic Spine Area Exam  Skin & Axial Inspection: No masses, redness, or swelling Alignment: Symmetrical Functional ROM: Unrestricted ROM Stability: No instability detected Muscle Tone/Strength: Functionally intact. No obvious neuro-muscular anomalies detected. Sensory (Neurological): Unimpaired Muscle strength & Tone: No palpable anomalies  Lumbar Spine Area Exam  Skin & Axial Inspection: No masses, redness, or swelling Alignment: Symmetrical Functional ROM: Unrestricted ROM      Stability: No instability detected Muscle Tone/Strength: Functionally intact. No obvious neuro-muscular anomalies detected. Sensory (Neurological): Unimpaired Palpation: No palpable anomalies       Provocative Tests: Lumbar Hyperextension and rotation test: evaluation deferred today       Lumbar Lateral bending test: evaluation deferred today       Patrick's Maneuver: evaluation deferred today                    Gait & Posture Assessment  Ambulation: Unassisted Gait: Relatively normal for age and body habitus Posture: WNL   Lower Extremity Exam    Side: Right lower extremity  Side: Left lower extremity  Skin & Extremity Inspection: Skin color, temperature, and hair growth are WNL. No peripheral edema or cyanosis. No masses, redness, swelling, asymmetry, or associated skin lesions. No contractures.  Skin & Extremity Inspection: Skin color, temperature, and hair growth are WNL. No peripheral edema or cyanosis. No masses, redness, swelling, asymmetry, or associated skin lesions. No contractures.  Functional ROM: Unrestricted ROM          Functional ROM: Unrestricted ROM          Muscle Tone/Strength: Functionally intact. No obvious neuro-muscular  anomalies detected.  Muscle Tone/Strength: Functionally intact. No obvious neuro-muscular anomalies detected.  Sensory (Neurological): Unimpaired  Sensory (Neurological): Unimpaired  Palpation: No palpable anomalies  Palpation: No palpable anomalies   Assessment  Primary Diagnosis & Pertinent Problem List: The primary encounter diagnosis was Chronic low back pain (Location of Primary Source of Pain) (Left). Diagnoses of Lumbar facet arthropathy (Bilateral) and Chronic lower extremity pain (Location of Secondary source of pain) (Left) were also pertinent to this visit.  Status Diagnosis  Controlled Controlled Controlled 1. Chronic low back pain (Location of Primary Source of Pain) (Left)   2. Lumbar facet arthropathy (Bilateral)   3. Chronic lower extremity pain (Location of Secondary source of pain) (Left)     Problems updated and reviewed during this visit: No problems updated. Plan of Care  Pharmacotherapy (Medications Ordered): No orders of the defined types were placed in this encounter. This SmartLink is deprecated. Use AVSMEDLIST instead to display the medication list for a patient. Medications administered today: Jamelyn Ristine had no medications administered during this visit.  Procedure Orders    No procedure(s) ordered today   Lab Orders  No laboratory test(s) ordered today   Imaging Orders  No imaging studies ordered today  Referral Orders  No referral(s) requested today    Interventional management options: Planned, scheduled, and/or pending:   NOTE: IVP Dye Allergy Pre-Treatment required, prior to contrast injections. ***   Considering:   Diagnostic Left L2-3 translaminar LESI #2+ left L2-3 TFESI #2  Diagnostic left sided caudal ESI +epidurogram. Palliative RACZepidurolysis of adhesions.   Palliative PRN treatment(s):   Palliative Left L2-3 translaminar LESI+ left L2-3 TFESIunder fluoroscopic guidance and IV sedation.   Provider-requested  follow-up: No Follow-up on file.  Future Appointments  Date Time Provider Department Center  11/18/2017  2:00 PM Delano Metz, MD [facility] None  12/03/2017  1:30 PM Midge Minium, MD [facility] None  01/09/2018 10:30 AM Barbette Merino, NP [facility] None   Primary Care Physician: Glori Luis, MD Location: [facility] Outpatient Pain Management Facility Note by: Oswaldo Done, MD Date: 11/18/2017; Time: 8:39 PM

## 2017-11-18 ENCOUNTER — Ambulatory Visit: Payer: Medicare HMO | Attending: Pain Medicine | Admitting: Pain Medicine

## 2017-11-18 ENCOUNTER — Ambulatory Visit: Payer: Medicare HMO | Admitting: Family Medicine

## 2017-11-20 ENCOUNTER — Ambulatory Visit: Payer: Self-pay | Admitting: *Deleted

## 2017-11-20 NOTE — Telephone Encounter (Signed)
Agree with evaluation. If worsens should be evaluated sooner.

## 2017-11-20 NOTE — Telephone Encounter (Signed)
Pt has had this feeling of jitteriness in her legs since last week after receiving a series of injections in her back to remove scar tissue. She went to Urgent Care last week and received IV potassium.  Appointment made for Friday. Advised to call back if she starts feeling worst. Pt voiced understanding.  Reason for Disposition . [1] MODERATE weakness (i.e., interferes with work, school, normal activities) AND [2] persists > 3 days  Answer Assessment - Initial Assessment Questions 1. DESCRIPTION: "Describe how you are feeling."     jittery 2. SEVERITY: "How bad is it?"  "Can you stand and walk?"   - MILD - Feels weak or tired, but does not interfere with work, school or normal activities   - [place] to stand and walk; weakness interferes with work, school, or normal activities   - SEVERE - Unable to stand or walk     moderate 3. ONSET:  "When did the weakness begin?"     Started last week 4. CAUSE: "What do you think is causing the weakness?"     Not sure 5. MEDICINES: "Have you recently started a new medicine or had a change in the amount of a medicine?"     No, had series of injections in back last week to break up scar tissue 6. OTHER SYMPTOMS: "Do you have any other symptoms?" (e.g., chest pain, fever, cough, SOB, vomiting, diarrhea, bleeding)     no 7. PREGNANCY: "Is there any chance you are pregnant?" "When was your last menstrual period?"     n/a  Protocols used: WEAKNESS (GENERALIZED) AND FATIGUE-A-AH

## 2017-11-20 NOTE — Telephone Encounter (Signed)
FYI

## 2017-11-22 ENCOUNTER — Ambulatory Visit: Payer: Medicare HMO | Admitting: Family Medicine

## 2017-11-22 ENCOUNTER — Other Ambulatory Visit: Payer: Self-pay

## 2017-11-22 ENCOUNTER — Ambulatory Visit: Payer: Medicare HMO | Admitting: Primary Care

## 2017-11-22 ENCOUNTER — Telehealth: Payer: Self-pay | Admitting: Family Medicine

## 2017-11-22 ENCOUNTER — Encounter: Payer: Self-pay | Admitting: Family Medicine

## 2017-11-22 VITALS — BP 130/90 | HR 74 | Temp 98.0°F | Wt 203.0 lb

## 2017-11-22 DIAGNOSIS — E876 Hypokalemia: Secondary | ICD-10-CM | POA: Diagnosis not present

## 2017-11-22 DIAGNOSIS — G4710 Hypersomnia, unspecified: Secondary | ICD-10-CM

## 2017-11-22 DIAGNOSIS — E538 Deficiency of other specified B group vitamins: Secondary | ICD-10-CM

## 2017-11-22 DIAGNOSIS — R5382 Chronic fatigue, unspecified: Secondary | ICD-10-CM

## 2017-11-22 DIAGNOSIS — G629 Polyneuropathy, unspecified: Secondary | ICD-10-CM | POA: Diagnosis not present

## 2017-11-22 LAB — VITAMIN B12: Vitamin B-12: 220 pg/mL (ref 211–911)

## 2017-11-22 LAB — COMPREHENSIVE METABOLIC PANEL
ALBUMIN: 3.9 g/dL (ref 3.5–5.2)
ALT: 8 U/L (ref 0–35)
AST: 15 U/L (ref 0–37)
Alkaline Phosphatase: 100 U/L (ref 39–117)
BILIRUBIN TOTAL: 0.6 mg/dL (ref 0.2–1.2)
BUN: 5 mg/dL — ABNORMAL LOW (ref 6–23)
CALCIUM: 9 mg/dL (ref 8.4–10.5)
CHLORIDE: 98 meq/L (ref 96–112)
CO2: 30 mEq/L (ref 19–32)
CREATININE: 0.73 mg/dL (ref 0.40–1.20)
GFR: 102.38 mL/min (ref >60.00)
Glucose, Bld: 98 mg/dL (ref 70–99)
Potassium: 3 mEq/L — ABNORMAL LOW (ref 3.5–5.1)
Sodium: 135 mEq/L (ref 135–145)
Total Protein: 7.5 g/dL (ref 6.0–8.3)

## 2017-11-22 LAB — CBC
HCT: 36.8 % (ref 36.0–46.0)
HEMOGLOBIN: 12.1 g/dL (ref 12.0–15.0)
MCHC: 33 g/dL (ref 30.0–36.0)
MCV: 84.5 fl (ref 78.0–100.0)
Platelets: 326 10*3/uL (ref 150.0–400.0)
RBC: 4.35 Mil/uL (ref 3.87–5.11)
RDW: 14.4 % (ref 11.5–15.5)
WBC: 5.6 10*3/uL (ref 4.0–10.5)

## 2017-11-22 LAB — TSH: TSH: 0.79 u[IU]/mL (ref 0.35–4.50)

## 2017-11-22 LAB — MAGNESIUM: MAGNESIUM: 1.8 mg/dL (ref 1.5–2.5)

## 2017-11-22 MED ORDER — POTASSIUM CHLORIDE CRYS ER 20 MEQ PO TBCR: 40.0000 meq | EXTENDED_RELEASE_TABLET | Freq: Two times a day (BID) | ORAL | 0 refills | Status: DC

## 2017-11-22 NOTE — Telephone Encounter (Signed)
Spoke with patient regarding results.  Potassium is low.  We will supplement.  This was sent to her pharmacy.  She will need to complete a 24-hour urine collection for potassium.  Her potassium will need to be rechecked next week.  Her B12 is borderline low and with her neuropathy we will proceed with further lab work.  This has been ordered.  We will have Janett Billow contact the patient when we are back in the office next week to get her set up for a lab appointment.

## 2017-11-22 NOTE — Assessment & Plan Note (Addendum)
This has been a recurrent issue.  She is no longer on any medicine that could be contributing.  We will recheck today.  We will check a magnesium.  Supplement if necessary.  May need to consider referral to nephrology to further evaluate.

## 2017-11-22 NOTE — Patient Instructions (Signed)
Nice to see you. We will get you set up for a sleep study. We will check lab work today and contact you with the results.

## 2017-11-22 NOTE — Assessment & Plan Note (Signed)
Patient with symptoms of chronic fatigue and tiredness.  No cardiac symptoms.  Potentially could be related to sleep apnea given snoring and hypersomnia during the day though her Epworth Sleepiness Scale is 0.  Discussed trying to get a home sleep test ordered.  We will check lab work as outlined below.

## 2017-11-22 NOTE — Progress Notes (Signed)
[name], MD Phone: [phone]  Maria Hamilton is a 66 y.o. female who presents today for follow-up.  Patient presents today complaining of decreased energy levels.  She just feels tired.  This has been chronic.  She had an injection in her back 3 or 4 weeks ago and has had a jittery sensation since then.  Notes tingling in her bilateral feet since then.  Does have a history of neuropathy.  Some mild numbness in her bilateral feet.  No weakness.  She has been taking Zanaflex and gabapentin.  Her husband reports she snores.  She has hypersomnia during the day.  She does wake up well rested.  Epworth sleepiness scale score of 0.  Does occasionally feel like her left knee is going to give out on her.  She went to an urgent care and they advised her to take potassium as her potassium has been low previously.  She notes no chest pain or shortness of breath.  Social History   Tobacco Use  Smoking Status Never Smoker  Smokeless Tobacco Never Used     ROS see history of present illness  Objective  Physical Exam Vitals:   11/22/17 1407  BP: 130/90  Pulse: 74  Temp: 98 F (36.7 C)  SpO2: 98%    BP Readings from Last 3 Encounters:  11/22/17 130/90  10/31/17 (!) 163/87  10/16/17 130/86   Wt Readings from Last 3 Encounters:  11/22/17 203 lb (92.1 kg)  10/31/17 203 lb (92.1 kg)  10/16/17 206 lb 3.2 oz (93.5 kg)    Physical Exam  Constitutional: No distress.  Cardiovascular: Normal rate, regular rhythm and normal heart sounds.  Pulmonary/Chest: Effort normal and breath sounds normal.  Musculoskeletal: She exhibits no edema.  No midline spine tenderness, no midline spine step-off, no muscular back tenderness, no skin changes overlying the lower back  Neurological: She is alert. Gait normal.  CN 2-12 intact, 5/5 strength in bilateral biceps, triceps, grip, quads, hamstrings, plantar and dorsiflexion, sensation to light touch intact in bilateral UE and LE, normal gait,  absent patellar reflexes  Skin: Skin is warm and dry. She is not diaphoretic.  2+ DP pulses, feet are warm and well perfused   Assessment/Plan: Please see individual problem list.  Chronic fatigue Patient with symptoms of chronic fatigue and tiredness.  No cardiac symptoms.  Potentially could be related to sleep apnea given snoring and hypersomnia during the day though her Epworth Sleepiness Scale is 0.  Discussed trying to get a home sleep test ordered.  We will check lab work as outlined below.  Polyneuropathy Suspect the tingling in her feet is related to her neuropathy.  We will check B12 to see if that is contributing.  We will check glucose as well.  She is vascularly intact in her feet.  Some slight numbness and tingling.  Could consider Lyrica in the future.  Hypokalemia This has been a recurrent issue.  She is no longer on any medicine that could be contributing.  We will recheck today.  We will check a magnesium.  Supplement if necessary.  May need to consider referral to nephrology to further evaluate.   Zaydah was seen today for fatigue.  Diagnoses and all orders for this visit:  Chronic fatigue -     B12 -     Comp Met (CMET) -     Magnesium -     CBC -     TSH  Hypersomnia -     Home sleep  test  Polyneuropathy  Hypokalemia    Orders Placed This Encounter  Procedures  . B12  . Comp Met (CMET)  . Magnesium  . CBC  . TSH  . Home sleep test    Order Specific Question:   Where should this test be performed:    Answer:   [facility]    No orders of the defined types were placed in this encounter.    [name], MD [place]

## 2017-11-22 NOTE — Assessment & Plan Note (Signed)
Suspect the tingling in her feet is related to her neuropathy.  We will check B12 to see if that is contributing.  We will check glucose as well.  She is vascularly intact in her feet.  Some slight numbness and tingling.  Could consider Lyrica in the future.

## 2017-11-27 NOTE — Telephone Encounter (Signed)
Left message to return call to schedule lab appointment this week, ok for pec to speak to patient and schedule

## 2017-11-28 NOTE — Telephone Encounter (Signed)
Patient is scheduled   

## 2017-11-29 ENCOUNTER — Other Ambulatory Visit: Payer: Medicare HMO

## 2017-12-03 ENCOUNTER — Ambulatory Visit: Payer: Medicare HMO | Admitting: Gastroenterology

## 2017-12-04 ENCOUNTER — Other Ambulatory Visit (INDEPENDENT_AMBULATORY_CARE_PROVIDER_SITE_OTHER): Payer: Medicare HMO

## 2017-12-04 DIAGNOSIS — E876 Hypokalemia: Secondary | ICD-10-CM | POA: Diagnosis not present

## 2017-12-04 DIAGNOSIS — E538 Deficiency of other specified B group vitamins: Secondary | ICD-10-CM

## 2017-12-04 LAB — POTASSIUM: POTASSIUM: 3.6 meq/L (ref 3.5–5.1)

## 2017-12-05 ENCOUNTER — Telehealth: Payer: Self-pay | Admitting: Family Medicine

## 2017-12-05 NOTE — Telephone Encounter (Signed)
Patient states she does not have enough money to go and be seen anywhere, she states she will try over the counter medication

## 2017-12-05 NOTE — Telephone Encounter (Signed)
Pt. C/o non-productive cough with clear phlegm. No fever. Offered to schedule an appointment, but states "I'm not coming out in this rain." Request Tussionex be called into [facility] on [address].

## 2017-12-05 NOTE — Telephone Encounter (Signed)
Noted.  If worsens or not improving she needs to be seen.

## 2017-12-05 NOTE — Telephone Encounter (Signed)
Patient would like the medication tussionex for her cough.  She is refusing to have appointment because she will not be going out in the rain.  Please advise.

## 2017-12-05 NOTE — Telephone Encounter (Signed)
Please advise 

## 2017-12-05 NOTE — Telephone Encounter (Signed)
Copied from [place]. Topic: Quick Communication - See Telephone Encounter >> Dec 05, 2017  8:07 AM Burnis Medin, NT wrote: CRM for notification. See Telephone encounter for: Pt is calling in because she has been having a cough for a few days and was wanting to see if the doctor could call her in some Tussionex. Pt uses [facility] on [address].  12/05/17.

## 2017-12-05 NOTE — Telephone Encounter (Signed)
The patient will have to be seen to receive consider appropriate treatment.  Please advise her of this.

## 2017-12-06 ENCOUNTER — Other Ambulatory Visit: Payer: Medicare HMO

## 2017-12-06 DIAGNOSIS — E876 Hypokalemia: Secondary | ICD-10-CM

## 2017-12-07 LAB — POTASSIUM, URINE, 24 HOUR: Potassium, 24H Ur: 30 mmol/24 h (ref 22–160)

## 2017-12-08 LAB — HOMOCYSTEINE: Homocysteine: 14.8 umol/L — ABNORMAL HIGH (ref <10.4)

## 2017-12-08 LAB — METHYLMALONIC ACID, SERUM: METHYLMALONIC ACID, QUANT: 356 nmol/L — AB (ref 87–318)

## 2017-12-09 ENCOUNTER — Other Ambulatory Visit: Payer: Self-pay | Admitting: Family Medicine

## 2017-12-09 DIAGNOSIS — I10 Essential (primary) hypertension: Secondary | ICD-10-CM

## 2017-12-09 DIAGNOSIS — E876 Hypokalemia: Secondary | ICD-10-CM

## 2017-12-09 NOTE — Progress Notes (Signed)
aloster

## 2017-12-19 ENCOUNTER — Ambulatory Visit (INDEPENDENT_AMBULATORY_CARE_PROVIDER_SITE_OTHER): Payer: Medicare HMO | Admitting: *Deleted

## 2017-12-19 ENCOUNTER — Other Ambulatory Visit (INDEPENDENT_AMBULATORY_CARE_PROVIDER_SITE_OTHER): Payer: Medicare HMO

## 2017-12-19 DIAGNOSIS — E876 Hypokalemia: Secondary | ICD-10-CM

## 2017-12-19 DIAGNOSIS — I10 Essential (primary) hypertension: Secondary | ICD-10-CM | POA: Diagnosis not present

## 2017-12-19 DIAGNOSIS — E538 Deficiency of other specified B group vitamins: Secondary | ICD-10-CM | POA: Diagnosis not present

## 2017-12-19 MED ORDER — CYANOCOBALAMIN 1000 MCG/ML IJ SOLN
1000.0000 ug | Freq: Once | INTRAMUSCULAR | Status: AC
  Administered 2017-12-19: 1000 ug via INTRAMUSCULAR

## 2017-12-19 NOTE — Progress Notes (Signed)
I have reviewed the above note and agree.  Ryn Peine, M.D.  

## 2017-12-19 NOTE — Progress Notes (Signed)
Patient presented for B 12 injection to left deltoid, patient voiced no concerns nor showed any signs of distress during injection  First of 3 weekly see lab note.

## 2017-12-25 LAB — ALDOSTERONE + RENIN ACTIVITY W/ RATIO
ALDO / PRA RATIO: 5.9 ratio (ref 0.9–28.9)
ALDOSTERONE: 2 ng/dL
Renin Activity: 0.34 ng/mL/h (ref 0.25–5.82)

## 2017-12-26 ENCOUNTER — Ambulatory Visit (INDEPENDENT_AMBULATORY_CARE_PROVIDER_SITE_OTHER): Payer: Medicare HMO

## 2017-12-26 DIAGNOSIS — E538 Deficiency of other specified B group vitamins: Secondary | ICD-10-CM

## 2017-12-26 MED ORDER — CYANOCOBALAMIN 1000 MCG/ML IJ SOLN
1000.0000 ug | Freq: Once | INTRAMUSCULAR | Status: AC
  Administered 2017-12-26: 1000 ug via INTRAMUSCULAR

## 2017-12-26 NOTE — Progress Notes (Signed)
I have reviewed the above note and agree.  Talyah Seder, M.D.  

## 2017-12-26 NOTE — Progress Notes (Signed)
Patient comes in today for vitamin B 12 injection. Administered in injection IM in right deltoid. Patient tolerated well. This is the second weekly injection out of 3. Patient will start monthly injections after next injection.

## 2017-12-28 ENCOUNTER — Other Ambulatory Visit: Payer: Self-pay | Admitting: Family Medicine

## 2017-12-28 DIAGNOSIS — E876 Hypokalemia: Secondary | ICD-10-CM

## 2017-12-31 ENCOUNTER — Other Ambulatory Visit: Payer: Self-pay

## 2017-12-31 NOTE — Telephone Encounter (Signed)
Last OV 11/22/17 last filled  Amlodipine last filled by Dr.Cook 03/25/17 90 3rf Sertraline filed under historical patient verified Pramipexole under historical patient verified

## 2018-01-02 ENCOUNTER — Emergency Department
Admission: EM | Admit: 2018-01-02 | Discharge: 2018-01-02 | Disposition: A | Discharge status: Home / Self Care | Payer: Medicare HMO | Attending: Emergency Medicine | Admitting: Emergency Medicine

## 2018-01-02 ENCOUNTER — Encounter: Payer: Self-pay | Admitting: Emergency Medicine

## 2018-01-02 ENCOUNTER — Ambulatory Visit: Payer: Medicare HMO

## 2018-01-02 DIAGNOSIS — I10 Essential (primary) hypertension: Secondary | ICD-10-CM | POA: Insufficient documentation

## 2018-01-02 DIAGNOSIS — R05 Cough: Secondary | ICD-10-CM | POA: Diagnosis present

## 2018-01-02 DIAGNOSIS — R52 Pain, unspecified: Secondary | ICD-10-CM | POA: Diagnosis not present

## 2018-01-02 DIAGNOSIS — R6883 Chills (without fever): Secondary | ICD-10-CM | POA: Insufficient documentation

## 2018-01-02 DIAGNOSIS — J40 Bronchitis, not specified as acute or chronic: Secondary | ICD-10-CM | POA: Diagnosis not present

## 2018-01-02 DIAGNOSIS — Z91040 Latex allergy status: Secondary | ICD-10-CM | POA: Insufficient documentation

## 2018-01-02 DIAGNOSIS — Z79899 Other long term (current) drug therapy: Secondary | ICD-10-CM | POA: Diagnosis not present

## 2018-01-02 MED ORDER — IBUPROFEN 600 MG PO TABS: 600.0000 mg | ORAL_TABLET | Freq: Three times a day (TID) | ORAL | 0 refills | Status: DC | PRN

## 2018-01-02 MED ORDER — KETOROLAC TROMETHAMINE 60 MG/2ML IM SOLN
30.0000 mg | Freq: Once | INTRAMUSCULAR | Status: AC
  Administered 2018-01-02: 30 mg via INTRAMUSCULAR
  Filled 2018-01-02: qty 2

## 2018-01-02 MED ORDER — BENZONATATE 100 MG PO CAPS
200.0000 mg | ORAL_CAPSULE | Freq: Once | ORAL | Status: AC
  Administered 2018-01-02: 200 mg via ORAL
  Filled 2018-01-02: qty 2

## 2018-01-02 MED ORDER — AZITHROMYCIN 250 MG PO TABS: ORAL_TABLET | ORAL | 0 refills | Status: AC

## 2018-01-02 MED ORDER — BENZONATATE 100 MG PO CAPS: 100.0000 mg | ORAL_CAPSULE | Freq: Three times a day (TID) | ORAL | 0 refills | Status: DC | PRN

## 2018-01-02 NOTE — ED Triage Notes (Signed)
Pt comes into the ED via POV c/o cough with green drainage.  Patient wanted to check and make sure she does not have a virus.  Patient states she is having generalized body aches from the cough.  Patient has taken her temperature at home but states that she has had body chills. Patient in NAD.

## 2018-01-02 NOTE — ED Provider Notes (Signed)
[facility] Emergency Department Provider Note   ____________________________________________   First MD Initiated Contact with Patient 01/02/18 1300     (approximate)  I have reviewed the triage vital signs and the nursing notes.   HISTORY  Chief Complaint Cough and Generalized Body Aches    HPI Maria Hamilton is a 67 y.o. female patient complain of productive cough consisting of green sputum for 2 days.  Patient states she has a doctor both day but felt too thick to go to the doctor and came to the emergency room for treatment.  Patient complained of generalized body aches from the coughing.  Patient states he has chills with no fever.  Patient denies nausea, vomiting, diarrhea.  Patient is taking flu shot for this season.  Patient rates the pain as 8/10.  Patient described pain as "generalized aching".  No pulses measured for complaint.   Past Medical History:  Diagnosis Date  . Acute anxiety 08/03/2015   Overview:  Last Assessment & Plan:  Klonopin most helpful  Reports past taking it 1 tab am 1/2 tab midday and 1 tab pm  30 day supply faxed to pharmacy  . Acute bronchitis 12/31/2016  . Acute pancreatitis 10/14/2016  . Altered mental status 10/12/2016  . Anxiety   . Chronic hip pain   . Chronic pain syndrome   . Depression   . Edema   . Fatigue   . Fibromyalgia   . GERD (gastroesophageal reflux disease)   . Headache   . Hypertension   . Insomnia   . Low back pain   . Right upper quadrant abdominal pain   . Vitamin D deficiency     Patient Active Problem List   Diagnosis Date Noted  . Degeneration of lumbar intervertebral disc 10/30/2017  . Osteoarthritis of hip 10/30/2017  . History of allergy to radiographic contrast media 10/29/2017  . Ankle pain 10/16/2017  . Anxiety 10/16/2017  . Viral URI with cough 10/16/2017  . RLS (restless legs syndrome) 10/10/2017  . Night sweats 08/13/2017  . Supraclavicular fossa fullness 08/13/2017  .  Chronic foot numbness (Left) 04/02/2017  . Vertigo 03/25/2017  . 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) 03/20/2017  . History of lumbar fusion (L3-4, L4-5, and L5-S1) 03/20/2017  . Post herpetic neuralgia 03/12/2017  . Abnormal MRI, lumbar spine (03/06/2017) 03/12/2017  . Memory difficulty 03/06/2017  . Polyneuropathy 03/06/2017  . Palpitations 11/26/2016  . Epigastric pain 11/26/2016  . Therapeutic opioid-induced constipation (OIC) 11/20/2016  . Constipation   . Hypokalemia 09/27/2016  . Long term current use of opiate analgesic 09/24/2016  . Long term prescription opiate use 09/24/2016  . Opiate use 09/24/2016  . Encounter for therapeutic drug level monitoring 09/24/2016  . Encounter for pain management planning 09/24/2016  . Rheumatoid arthritis, multiple sites (positive RF) 09/24/2016  . Chronic lower extremity pain (Location of Secondary source of pain) (Left) 09/24/2016  . Lumbar facet arthropathy (Bilateral) 09/24/2016  . Failed back surgical syndrome (x 3) 09/24/2016  . Epidural fibrosis 09/24/2016  . Neurogenic pain 09/24/2016  . Musculoskeletal pain 09/24/2016  . Chronic lumbar radicular pain (L5/S1 dermatome) (Location of Secondary source of pain) (Left) 09/24/2016  . History of total hip replacement, bilateral 09/24/2016  . GERD (gastroesophageal reflux disease) 08/03/2015  . Rheumatoid factor positive 08/03/2015  . Chronic fatigue 08/03/2015  . Vitamin D deficiency 08/03/2015  . Insomnia 08/03/2015  . Fibromyalgia 08/03/2015  . Chronic low back pain (Location of Primary Source of  Pain) (Left) 08/03/2015  . Chronic pain syndrome 08/03/2015  . Depression 08/03/2015  . Hypertension 08/03/2015  . Mechanical complication of internal joint prosthesis ([place]) 04/22/2012    Past Surgical History:  Procedure Laterality Date  . ABDOMINAL HYSTERECTOMY    . BACK SURGERY    . BREAST SURGERY    . CHOLECYSTECTOMY N/A 10/16/2016   Procedure: LAPAROSCOPIC  CHOLECYSTECTOMY WITH INTRAOPERATIVE CHOLANGIOGRAM;  Surgeon: Dia Crawford III, MD;  Location: [facility] ORS;  Service: General;  Laterality: N/A;  . cyst removal from wrist    . ECTOPIC PREGNANCY SURGERY    . HIP SURGERY     x4    Prior to Admission medications   Medication Sig Start Date End Date Taking? Authorizing Provider  amLODipine (NORVASC) 10 MG tablet Take 1 tablet (10 mg total) by mouth daily. 03/25/17   Coral Spikes, DO  azithromycin (ZITHROMAX Z-PAK) 250 MG tablet Take 2 tablets (500 mg) on  Day 1,  followed by 1 tablet (250 mg) once daily on Days 2 through 5. 01/02/18 01/07/18  Sable Feil, PA-C  benzonatate (TESSALON PERLES) 100 MG capsule Take 1 capsule (100 mg total) by mouth 3 (three) times daily as needed for cough. 01/02/18   Sable Feil, PA-C  escitalopram (LEXAPRO) 20 MG tablet Take by mouth. 05/29/16   [provider]  gabapentin (NEURONTIN) 300 MG capsule Take 1-3 capsules (300-900 mg total) by mouth 4 (four) times daily. Follow titration schedule. 10/09/17 04/07/18  Vevelyn Francois, NP  ibuprofen (ADVIL,MOTRIN) 600 MG tablet Take 1 tablet (600 mg total) by mouth every 8 (eight) hours as needed. 01/02/18   Sable Feil, PA-C  potassium chloride SA (K-DUR,KLOR-CON) 20 MEQ tablet Take 2 tablets (40 mEq total) by mouth 2 (two) times daily. 11/22/17   Leone Haven, MD  pramipexole (MIRAPEX) 0.5 MG tablet Take 0.5 mg by mouth daily. 12/11/17   [provider]  sertraline (ZOLOFT) 50 MG tablet Take 50 mg by mouth daily. 10/16/17   [provider]  tizanidine (ZANAFLEX) 2 MG capsule Take 1 capsule (2 mg total) by mouth 3 (three) times daily as needed for muscle spasms. 10/09/17 04/07/18  Vevelyn Francois, NP    Allergies Ace inhibitors; Ciprofloxacin; Fentanyl; Latex; Morphine and related; Penicillin g benzathine; Isovue m [iopamidol]; and Tape  Family History  Problem Relation Age of Onset  . Heart disease Mother   . Cancer Father   . Alcohol  abuse Father     Social History Social History   Tobacco Use  . Smoking status: Never Smoker  . Smokeless tobacco: Never Used  Substance Use Topics  . Alcohol use: No    Alcohol/week: 0.0 oz  . Drug use: No    Review of Systems  Constitutional: Chills but no fever.  Last body aches Eyes: No visual changes. ENT: No sore throat. Cardiovascular: Denies chest pain. Respiratory: Denies shortness of breath.  Productive greenish cough Gastrointestinal: No abdominal pain.  No nausea, no vomiting.  No diarrhea.  No constipation. Genitourinary: Negative for dysuria. Musculoskeletal: Negative for back pain. Skin: Negative for rash. Neurological: Negative for headaches, focal weakness or numbness. Psychiatric:Anxiety, depression, and fibromyalgia. Endocrine:Hypertension Hematological/Lymphatic: Allergic/Immunilogical: See medication list ____________________________________________   PHYSICAL EXAM:  VITAL SIGNS: ED Triage Vitals [01/02/18 1242]  Enc Vitals Group     BP (!) 113/52     Pulse Rate 82     Resp 20     Temp 98.6 F (37 C)  Temp Source Oral     SpO2 99 %     Weight 203 lb (92.1 kg)     Height 6\' 1"  (1.854 m)     Head Circumference      Peak Flow      Pain Score 8     Pain Loc      Pain Edu?      Excl. in [place]?    Constitutional: Alert and oriented. Well appearing and in no acute distress. Neck: No stridor. Hematological/Lymphatic/Immunilogical: No cervical lymphadenopathy. Cardiovascular: Normal rate, regular rhythm. Grossly normal heart sounds.  Good peripheral circulation. Respiratory: Normal respiratory effort.  No retractions. Lungs bilateral rales Gastrointestinal: Soft and nontender. No distention. No abdominal bruits. No CVA tenderness. Neurologic:  Normal speech and language. No gross focal neurologic deficits are appreciated. No gait instability. Skin:  Skin is warm, dry and intact. No rash noted. Psychiatric: Mood and affect are normal. Speech  and behavior are normal.  ____________________________________________   LABS (all labs ordered are listed, but only abnormal results are displayed)  Labs Reviewed - No data to display ____________________________________________  EKG   ____________________________________________  RADIOLOGY  No results found.  ____________________________________________   PROCEDURES  Procedure(s) performed: None  Procedures  Critical Care performed: No  ____________________________________________   INITIAL IMPRESSION / ASSESSMENT AND PLAN / ED COURSE  As part of my medical decision making, I reviewed the following data within the electronic MEDICAL RECORD NUMBER    Cough secondary to bronchitis.  Patient given discharge care instruction advised take medication as directed.  Patient advised follow-up family doctor for continued care.      ____________________________________________   FINAL CLINICAL IMPRESSION(S) / ED DIAGNOSES  Final diagnoses:  Bronchitis     ED Discharge Orders        Ordered    azithromycin (ZITHROMAX Z-PAK) 250 MG tablet     01/02/18 1307    benzonatate (TESSALON PERLES) 100 MG capsule  3 times daily PRN     01/02/18 1307    ibuprofen (ADVIL,MOTRIN) 600 MG tablet  Every 8 hours PRN     01/02/18 1307       Note:  This document was prepared using Dragon voice recognition software and may include unintentional dictation errors.    Sable Feil, PA-C 01/02/18 1314    Earleen Newport, MD 01/02/18 [phone]

## 2018-01-02 NOTE — ED Notes (Signed)
See triage note   States she is "weak" and having body aches over the past 2 days unsure of fever at home but has had chills  Occasional prod cough   Afebrile on arrival

## 2018-01-04 MED ORDER — AMLODIPINE BESYLATE 10 MG PO TABS: 10.0000 mg | ORAL_TABLET | Freq: Every day | ORAL | 3 refills | Status: DC

## 2018-01-04 NOTE — Telephone Encounter (Signed)
Please check with the patient to see who has been filling the Mirapex previously.  It looks like it may have come from pain management.  That medicine should continue to come from them.  If it is coming from a provider she is no longer seeing please let me know.  Please find out if she is taking Lexapro or Zoloft.  If she is not taking Lexapro please remove it from her medication list.  I will refill the amlodipine.

## 2018-01-06 NOTE — Telephone Encounter (Signed)
Patient states pain management fills this, informed patient they will need to refill. Patient states she takes lexapro and sertraline

## 2018-01-07 ENCOUNTER — Ambulatory Visit: Payer: Medicare HMO | Admitting: Gastroenterology

## 2018-01-07 MED ORDER — SERTRALINE HCL 50 MG PO TABS: 50.0000 mg | ORAL_TABLET | Freq: Every day | ORAL | 3 refills | Status: DC

## 2018-01-07 NOTE — Telephone Encounter (Signed)
Spoke with patient regarding medications.  She reports she is not taking Lexapro.  She is only taking Zoloft.  We will remove this from her medication list.

## 2018-01-09 ENCOUNTER — Ambulatory Visit: Payer: Medicare HMO | Admitting: Nurse Practitioner

## 2018-01-13 ENCOUNTER — Telehealth: Payer: Self-pay | Admitting: Pain Medicine

## 2018-01-13 NOTE — Telephone Encounter (Signed)
Patient needs to schedule appt for medication management per [place] with husband to let him know and that the secretary would call him to schedule.

## 2018-01-13 NOTE — Telephone Encounter (Signed)
Patients Husband called to check why her meds have not been sent to [facility] for mail delivery. The last appt for patient was Oct 31, 2017  For RACZ procedure Please check on this and let patient know status. Patient canceled her last visit for follow up to [facility] and has not resched for meds or follow up.  [employer]  [phone]  Can call husband for any questions.

## 2018-01-15 ENCOUNTER — Ambulatory Visit: Payer: Medicare HMO | Admitting: Nurse Practitioner

## 2018-01-20 ENCOUNTER — Encounter: Payer: Self-pay | Admitting: Nurse Practitioner

## 2018-01-20 ENCOUNTER — Other Ambulatory Visit: Payer: Self-pay

## 2018-01-20 ENCOUNTER — Ambulatory Visit: Payer: Medicare HMO | Attending: Nurse Practitioner | Admitting: Nurse Practitioner

## 2018-01-20 VITALS — BP 154/91 | HR 70 | Temp 98.0°F | Resp 16 | Ht 73.0 in | Wt 194.0 lb

## 2018-01-20 DIAGNOSIS — R208 Other disturbances of skin sensation: Secondary | ICD-10-CM | POA: Insufficient documentation

## 2018-01-20 DIAGNOSIS — M0579 Rheumatoid arthritis with rheumatoid factor of multiple sites without organ or systems involvement: Secondary | ICD-10-CM

## 2018-01-20 DIAGNOSIS — M5416 Radiculopathy, lumbar region: Secondary | ICD-10-CM | POA: Diagnosis not present

## 2018-01-20 DIAGNOSIS — R200 Anesthesia of skin: Secondary | ICD-10-CM

## 2018-01-20 DIAGNOSIS — K219 Gastro-esophageal reflux disease without esophagitis: Secondary | ICD-10-CM | POA: Diagnosis not present

## 2018-01-20 DIAGNOSIS — Z90710 Acquired absence of both cervix and uterus: Secondary | ICD-10-CM | POA: Diagnosis not present

## 2018-01-20 DIAGNOSIS — Z881 Allergy status to other antibiotic agents status: Secondary | ICD-10-CM | POA: Diagnosis not present

## 2018-01-20 DIAGNOSIS — Z809 Family history of malignant neoplasm, unspecified: Secondary | ICD-10-CM | POA: Insufficient documentation

## 2018-01-20 DIAGNOSIS — Z885 Allergy status to narcotic agent status: Secondary | ICD-10-CM | POA: Insufficient documentation

## 2018-01-20 DIAGNOSIS — Z79899 Other long term (current) drug therapy: Secondary | ICD-10-CM | POA: Insufficient documentation

## 2018-01-20 DIAGNOSIS — M792 Neuralgia and neuritis, unspecified: Secondary | ICD-10-CM | POA: Diagnosis not present

## 2018-01-20 DIAGNOSIS — M797 Fibromyalgia: Secondary | ICD-10-CM | POA: Insufficient documentation

## 2018-01-20 DIAGNOSIS — M7918 Myalgia, other site: Secondary | ICD-10-CM | POA: Diagnosis not present

## 2018-01-20 DIAGNOSIS — E559 Vitamin D deficiency, unspecified: Secondary | ICD-10-CM | POA: Insufficient documentation

## 2018-01-20 DIAGNOSIS — Z811 Family history of alcohol abuse and dependence: Secondary | ICD-10-CM | POA: Diagnosis not present

## 2018-01-20 DIAGNOSIS — R51 Headache: Secondary | ICD-10-CM | POA: Insufficient documentation

## 2018-01-20 DIAGNOSIS — Z9049 Acquired absence of other specified parts of digestive tract: Secondary | ICD-10-CM | POA: Diagnosis not present

## 2018-01-20 DIAGNOSIS — Z888 Allergy status to other drugs, medicaments and biological substances status: Secondary | ICD-10-CM | POA: Insufficient documentation

## 2018-01-20 DIAGNOSIS — I10 Essential (primary) hypertension: Secondary | ICD-10-CM | POA: Insufficient documentation

## 2018-01-20 DIAGNOSIS — G894 Chronic pain syndrome: Secondary | ICD-10-CM | POA: Diagnosis present

## 2018-01-20 DIAGNOSIS — Z9889 Other specified postprocedural states: Secondary | ICD-10-CM | POA: Insufficient documentation

## 2018-01-20 DIAGNOSIS — G8929 Other chronic pain: Secondary | ICD-10-CM

## 2018-01-20 DIAGNOSIS — Z8249 Family history of ischemic heart disease and other diseases of the circulatory system: Secondary | ICD-10-CM | POA: Insufficient documentation

## 2018-01-20 MED ORDER — GABAPENTIN 600 MG PO TABS: 600.0000 mg | ORAL_TABLET | Freq: Four times a day (QID) | ORAL | 0 refills | Status: DC

## 2018-01-20 MED ORDER — PRAMIPEXOLE DIHYDROCHLORIDE 0.5 MG PO TABS
0.5000 mg | ORAL_TABLET | Freq: Every day | ORAL | 2 refills | Status: DC
Start: 2018-01-20 — End: 2018-03-10

## 2018-01-20 NOTE — Patient Instructions (Addendum)

## 2018-01-20 NOTE — Progress Notes (Signed)
Patient's Name: Maria Hamilton  MRN: [id number]  Referring Provider: Glori Luis, MD  DOB: 03-Aug-1951  PCP: Glori Luis, MD  DOS: 01/20/2018  Note by: Barbette Merino NP  Service setting: Ambulatory outpatient  Specialty: Interventional Pain Management  Location: [facility] (AMB) Pain Management Facility    Patient type: Established    Primary Reason(s) for Visit: Encounter for prescription drug management & post-procedure evaluation of chronic illness with mild to moderate exacerbation(Level of risk: moderate) CC: Back Pain (lower)  HPI  Maria Hamilton is a 67 y.o. year old, female patient, who comes today for a post-procedure evaluation and medication management. She has GERD (gastroesophageal reflux disease); Rheumatoid factor positive; Chronic fatigue; Vitamin D deficiency; Insomnia; Fibromyalgia; Chronic low back pain (Location of Primary Source of Pain) (Left); Chronic pain syndrome; Depression; Hypertension; 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); Long term current use of opiate analgesic; Long term prescription opiate use; Opiate use; Encounter for therapeutic drug level monitoring; Encounter for pain management planning; Rheumatoid arthritis, multiple sites (positive RF); Chronic lower extremity pain (Location of Secondary source of pain) (Left); Lumbar facet arthropathy (Bilateral); Failed back surgical syndrome (x 3); Epidural fibrosis; Neurogenic pain; Musculoskeletal pain; Chronic lumbar radicular pain (L5/S1 dermatome) (Location of Secondary source of pain) (Left); History of total hip replacement, bilateral; Hypokalemia; Constipation; Therapeutic opioid-induced constipation (OIC); Palpitations; Epigastric pain; Memory difficulty; Polyneuropathy; Post herpetic neuralgia; Abnormal MRI, lumbar spine (03/06/2017); 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 Her primarily concern today is the Back Pain (lower)  Pain Assessment: Location:   Back Onset: More than a month ago Quality: Aching, Radiating, Discomfort, Numbness, Moaning Severity:  /10 (self-reported pain score)  Note: Reported level is compatible with observation.                         When using our objective Pain Scale, levels between 6 and 10/10 are said to belong in an emergency room, as it progressively worsens from a 6/10, described as severely limiting, requiring emergency care not usually available at an outpatient pain management facility. At a 6/10 level, communication becomes difficult and requires great effort. Assistance to reach the emergency department may be required. Facial flushing and profuse sweating along with potentially dangerous increases in heart rate and blood pressure will be evident. Effect on ADL: I can't do anything, Constant pain" Timing: Constant Modifying factors: "Nothing" I cant do anything"  Maria Hamilton was last seen on 10/31/2017 for a procedure. During today's appointment we reviewed Maria Hamilton post-procedure results, as well as her outpatient medication regimen.  She is wanting something to stop the pain. She is SP Racz procedure.  She states that the procedure was only effective for 3 days. She is not getting any relief. She states that the pain goes from the back of the legs to her foot. She states that her foot feels frozen. She has been off her medication any narcotic medication for several months,. She states that was not effective so she stopped using this; Tramadol.  She admits that the Gabapentin is also not effective. She admit that she does continue to use this. She has tried Lyrica  however it was  too expensive.  She admits that she never tried the Mirapex for her RLS symptoms.   Further details on both, my assessment(s), as well as the proposed treatment plan, please see below.  Controlled Substance Pharmacotherapy Assessment REMS (Risk Evaluation and Mitigation Strategy)  Analgesic: None MME/day: 0 mg/day.  Newman Pies, RN  01/20/2018  9:36 AM  Sign at close encounter Nursing Pain Medication Assessment:  Safety precautions to be maintained throughout the outpatient stay will include: orient to surroundings, keep bed in low position, maintain call bell within reach at all times, provide assistance with transfer out of bed and ambulation.  Medication Inspection Compliance: Maria Hamilton did not comply with our request to bring her pills to be counted. She was reminded that bringing the medication bottles, even when empty, is a requirement.  Medication: None brought in. Pill/Patch Count: None available to be counted. Bottle Appearance: No container available. Did not bring bottle(s) to appointment. Filled Date: N/A Last Medication intake:  Yesterday   Pharmacokinetics: Liberation and absorption (onset of action): WNL Distribution (time to peak effect): WNL Metabolism and excretion (duration of action): WNL         Pharmacodynamics: Desired effects: Analgesia: Maria Hamilton reports >50% benefit. Functional ability: Patient reports that medication allows her to accomplish basic ADLs Clinically meaningful improvement in function (CMIF): Sustained CMIF goals met Perceived effectiveness: Described as relatively effective, allowing for increase in activities of daily living (ADL) Undesirable effects: Side-effects or Adverse reactions: None reported Monitoring: [place] PMP: Online review of the past 42-month period conducted. Compliant with practice rules and regulations Last UDS on record: Summary  Date Value Ref Range Status  09/24/2016 FINAL  Final    Comment:     ==================================================================== TOXASSURE COMP DRUG ANALYSIS,UR ==================================================================== Test                             Result       Flag       Units Drug Present and Declared for Prescription Verification   Hydrocodone                    1294         EXPECTED   ng/mg creat   Hydromorphone                  104          EXPECTED   ng/mg creat   Dihydrocodeine                 420          EXPECTED   ng/mg creat   Norhydrocodone                 >1142        EXPECTED   ng/mg creat    Sources of hydrocodone include scheduled prescription    medications. Hydromorphone, dihydrocodeine and norhydrocodone are    expected metabolites of hydrocodone. Hydromorphone and    dihydrocodeine are also available as scheduled prescription    medications.   Oxymorphone                    39           EXPECTED   ng/mg creat   Noroxymorphone                 19           EXPECTED  ng/mg creat    Sources of oxymorphone are scheduled prescription medications;    oxymorphone is also a metabolite of oxycodone. Noroxymorphone is    an expected metabolite of oxymorphone.   Citalopram                     PRESENT      EXPECTED   Desmethylcitalopram            PRESENT      EXPECTED    Desmethylcitalopram is an expected metabolite of citalopram or    the enantiomeric form, escitalopram.   Acetaminophen                  PRESENT      EXPECTED Drug Present not Declared for Prescription Verification   Orphenadrine                   PRESENT      UNEXPECTED   Ibuprofen                      PRESENT      UNEXPECTED Drug Absent but Declared for Prescription Verification   Oxycodone                      Not Detected UNEXPECTED ng/mg creat    Oxycodone is almost always present in patients taking this drug    consistently.  Absence of oxycodone could be due to lapse of time    since the last dose or unusual pharmacokinetics (rapid     metabolism).   Hydroxyzine                    Not Detected UNEXPECTED ==================================================================== Test                      Result    Flag   Units      Ref Range   Creatinine              438              mg/dL      >=78 ==================================================================== Declared Medications:  The flagging and interpretation on this report are based on the  following declared medications.  Unexpected results may arise from  inaccuracies in the declared medications.  **Note: The testing scope of this panel includes these medications:  Citalopram (Lexapro)  Hydrocodone (Norco)  Hydroxyzine (Atarax)  Oxycodone (Percocet)  **Note: The testing scope of this panel does not include small to  moderate amounts of these reported medications:  Acetaminophen (Norco)  Acetaminophen (Percocet)  **Note: The testing scope of this panel does not include following  reported medications:  Amlodipine (Norvasc)  Hydrochlorothiazide (Hydrodiuril)  Meclizine (Antivert)  Methylprednisolone (Medrol Dose Pack)  Potassium ==================================================================== For clinical consultation, please call [phone]. ====================================================================    UDS interpretation: Compliant          Medication Assessment Form: Reviewed. Patient indicates being compliant with therapy Treatment compliance: Compliant Risk Assessment Profile: Aberrant behavior: See prior evaluations. None observed or detected today Comorbid factors increasing risk of overdose: See prior notes. No additional risks detected today Risk of substance use disorder (SUD): Low Opioid Risk Tool - 01/20/18 0933      Family History of Substance Abuse   Alcohol  Negative    Illegal Drugs  Negative    Rx Drugs  Negative      Personal History of Substance  Abuse   Alcohol  Negative    Illegal Drugs  Negative    Rx Drugs   Negative      Psychological Disease   Psychological Disease  Negative    Depression  Negative      Total Score   Opioid Risk Tool Scoring  0    Opioid Risk Interpretation  Low Risk      ORT Scoring interpretation table:  Score <3 = Low Risk for SUD  Score between 4-7 = Moderate Risk for SUD  Score >8 = High Risk for Opioid Abuse   Risk Mitigation Strategies:  Patient Counseling: Covered Patient-Prescriber Agreement (PPA): Present and active  Notification to other healthcare providers: Done  Pharmacologic Plan: No change in therapy, at this time.             Post-Procedure Assessment  10/31/2017 Procedure: RACZ Pre-procedure pain score:  7/10 Post-procedure pain score: 2/10         Influential Factors: BMI: 25.60 kg/m Intra-procedural challenges: None observed.         Assessment challenges: None detected.              Reported side-effects: None.        Post-procedural adverse reactions or complications: None reported         Sedation: Please see nurses note. When no sedatives are used, the analgesic levels obtained are directly associated to the effectiveness of the local anesthetics. However, when sedation is provided, the level of analgesia obtained during the initial 1 hour following the intervention, is believed to be the result of a combination of factors. These factors may include, but are not limited to: 1. The effectiveness of the local anesthetics used. 2. The effects of the analgesic(s) and/or anxiolytic(s) used. 3. The degree of discomfort experienced by the patient at the time of the procedure. 4. The patients ability and reliability in recalling and recording the events. 5. The presence and influence of possible secondary gains and/or psychosocial factors. Reported result: Relief experienced during the 1st hour after the procedure: 100 % (Ultra-Short Term Relief)            Interpretative annotation: Clinically appropriate result. Analgesia during this period  is likely to be Local Anesthetic and/or IV Sedative (Analgesic/Anxiolytic) related.          Effects of local anesthetic: The analgesic effects attained during this period are directly associated to the localized infiltration of local anesthetics and therefore cary significant diagnostic value as to the etiological location, or anatomical origin, of the pain. Expected duration of relief is directly dependent on the pharmacodynamics of the local anesthetic used. Long-acting (4-6 hours) anesthetics used.  Reported result: Relief during the next 4 to 6 hour after the procedure: 80 % (Short-Term Relief)            Interpretative annotation: Clinically appropriate result. Analgesia during this period is likely to be Local Anesthetic-related.          Long-term benefit: Defined as the period of time past the expected duration of local anesthetics (1 hour for short-acting and 4-6 hours for long-acting). With the possible exception of prolonged sympathetic blockade from the local anesthetics, benefits during this period are typically attributed to, or associated with, other factors such as analgesic sensory neuropraxia, antiinflammatory effects, or beneficial biochemical changes provided by agents other than the local anesthetics.  Reported result: Extended relief following procedure: 50 % (Long-Term Relief)  Interpretative annotation: Clinically appropriate result. Good relief. No permanent benefit expected. Inflammation plays a part in the etiology to the pain.          Current benefits: Defined as reported results that persistent at this point in time.   Analgesia: 0 %            Function: No benefit ROM: No benefit Interpretative annotation: Recurrence of symptoms. Therapeutic failure.             Interpretation: Results would suggest failure of therapy in achieving desired goal(s).                  Plan:  Please see "Plan of Care" for details.        Laboratory Chemistry  Inflammation  Markers (CRP: Acute Phase) (ESR: Chronic Phase) Lab Results  Component Value Date   CRP <0.8 09/25/2016   ESRSEDRATE 17 08/13/2017   LATICACIDVEN 1.0 10/24/2016                 Rheumatology Markers No results found for: RF, ANA, LABURIC, URICUR, LYMEIGGIGMAB, LYMEABIGMQN              Renal Function Markers Lab Results  Component Value Date   BUN 5 (L) 11/22/2017   CREATININE 0.73 11/22/2017   GFRAA >60 11/15/2016   GFRNONAA >60 11/15/2016                 Hepatic Function Markers Lab Results  Component Value Date   AST 15 11/22/2017   ALT 8 11/22/2017   ALBUMIN 3.9 11/22/2017   ALKPHOS 100 11/22/2017   LIPASE 9.0 (L) 11/26/2016                 Electrolytes Lab Results  Component Value Date   NA 135 11/22/2017   K 3.6 12/04/2017   CL 98 11/22/2017   CALCIUM 9.0 11/22/2017   MG 1.8 11/22/2017                 Neuropathy Markers Lab Results  Component Value Date   VITAMINB12 220 11/22/2017   HIV NONREACTIVE 03/06/2017                 Bone Pathology Markers Lab Results  Component Value Date   25OHVITD1 14 (L) 09/25/2016   25OHVITD2 6.4 09/25/2016   25OHVITD3 7.7 09/25/2016                 Coagulation Parameters Lab Results  Component Value Date   PLT 326.0 11/22/2017                 Cardiovascular Markers Lab Results  Component Value Date   TROPONINI <0.03 07/13/2016   HGB 12.1 11/22/2017   HCT 36.8 11/22/2017                 CA Markers No results found for: CEA, CA125, LABCA2               Note: Lab results reviewed.  Recent Diagnostic Imaging Results  DG C-Arm 1-60 Min-No Report Fluoroscopy was utilized by the requesting physician.  No radiographic  interpretation.   Complexity Note: Imaging results reviewed. Results shared with Maria Hamilton, using Layman's terms.                         Meds   Current Outpatient Medications:  .  amLODipine (NORVASC) 10 MG tablet, Take 1 tablet (10 mg total) by  mouth daily., Disp: 90 tablet, Rfl: 3 .   gabapentin (NEURONTIN) 300 MG capsule, Take 1-3 capsules (300-900 mg total) by mouth 4 (four) times daily. Follow titration schedule., Disp: 360 capsule, Rfl: 5 .  ibuprofen (ADVIL,MOTRIN) 600 MG tablet, Take 1 tablet (600 mg total) by mouth every 8 (eight) hours as needed., Disp: 15 tablet, Rfl: 0 .  pramipexole (MIRAPEX) 0.5 MG tablet, Take 1 tablet (0.5 mg total) by mouth daily., Disp: 30 tablet, Rfl: 2 .  sertraline (ZOLOFT) 50 MG tablet, Take 1 tablet (50 mg total) by mouth daily., Disp: 90 tablet, Rfl: 3 .  tizanidine (ZANAFLEX) 2 MG capsule, Take 1 capsule (2 mg total) by mouth 3 (three) times daily as needed for muscle spasms., Disp: 90 capsule, Rfl: 5 .  gabapentin (NEURONTIN) 600 MG tablet, Take 1 tablet (600 mg total) by mouth QID., Disp: 120 tablet, Rfl: 0  ROS  Constitutional: Denies any fever or chills Gastrointestinal: No reported hemesis, hematochezia, vomiting, or acute GI distress Musculoskeletal: Denies any acute onset joint swelling, redness, loss of ROM, or weakness Neurological: No reported episodes of acute onset apraxia, aphasia, dysarthria, agnosia, amnesia, paralysis, loss of coordination, or loss of consciousness  Allergies  Maria Hamilton is allergic to ace inhibitors; ciprofloxacin; fentanyl; latex; morphine and related; penicillin g benzathine; isovue m [iopamidol]; and tape.  PFSH  Drug: Maria Hamilton  reports that she does not use drugs. Alcohol:  reports that she does not drink alcohol. Tobacco:  reports that  has never smoked. she has never used smokeless tobacco. Medical:  has a past medical history of Acute anxiety (08/03/2015), Acute bronchitis (12/31/2016), Acute pancreatitis (10/14/2016), Altered mental status (10/12/2016), Anxiety, Bronchitis, Chronic hip pain, Chronic pain syndrome, Depression, Edema, Fatigue, Fibromyalgia, GERD (gastroesophageal reflux disease), Headache, Hypertension, Insomnia, Low back pain, Right upper quadrant abdominal pain, and Vitamin D  deficiency. Surgical: Maria Hamilton  has a past surgical history that includes Breast surgery; Back surgery; Ectopic pregnancy surgery; Hip surgery; Abdominal hysterectomy; cyst removal from wrist; and Cholecystectomy (N/A, 10/16/2016). Family: family history includes Alcohol abuse in her father; Cancer in her father; Heart disease in her mother.  Constitutional Exam  General appearance: Well nourished, well developed, and well hydrated. In no apparent acute distress Vitals:   01/20/18 0925  BP: (!) 154/91  Pulse: 70  Resp: 16  Temp: 98 F (36.7 C)  SpO2: 100%  Weight: 194 lb (88 kg)  Height: 6\' 1"  (1.854 m)   BMI Assessment: Estimated body mass index is 25.6 kg/m as calculated from the following:   Height as of this encounter: 6\' 1"  (1.854 m).   Weight as of this encounter: 194 lb (88 kg). Psych/Mental status: Alert, oriented x 3 (person, place, & time)       Eyes: PERLA Respiratory: No evidence of acute respiratory distress  Cervical Spine Area Exam  Skin & Axial Inspection: No masses, redness, edema, swelling, or associated skin lesions Alignment: Symmetrical Functional ROM: Unrestricted ROM      Stability: No instability detected Muscle Tone/Strength: Functionally intact. No obvious neuro-muscular anomalies detected. Sensory (Neurological): Unimpaired Palpation: No palpable anomalies              Upper Extremity (UE) Exam    Side: Right upper extremity  Side: Left upper extremity  Skin & Extremity Inspection: Skin color, temperature, and hair growth are WNL. No peripheral edema or cyanosis. No masses, redness, swelling, asymmetry, or associated skin lesions. No contractures.  Skin & Extremity Inspection: Skin color, temperature,  and hair growth are WNL. No peripheral edema or cyanosis. No masses, redness, swelling, asymmetry, or associated skin lesions. No contractures.  Functional ROM: Unrestricted ROM          Functional ROM: Unrestricted ROM          Muscle Tone/Strength:  Functionally intact. No obvious neuro-muscular anomalies detected.  Muscle Tone/Strength: Functionally intact. No obvious neuro-muscular anomalies detected.  Sensory (Neurological): Unimpaired          Sensory (Neurological): Unimpaired          Palpation: No palpable anomalies              Palpation: No palpable anomalies              Specialized Test(s): Deferred         Specialized Test(s): Deferred          Thoracic Spine Area Exam  Skin & Axial Inspection: No masses, redness, or swelling Alignment: Symmetrical Functional ROM: Unrestricted ROM Stability: No instability detected Muscle Tone/Strength: Functionally intact. No obvious neuro-muscular anomalies detected. Sensory (Neurological): Unimpaired Muscle strength & Tone: No palpable anomalies  Lumbar Spine Area Exam  Skin & Axial Inspection: No masses, redness, or swelling Alignment: Symmetrical Functional ROM: Unrestricted ROM      Stability: No instability detected Muscle Tone/Strength: Functionally intact. No obvious neuro-muscular anomalies detected. Sensory (Neurological): Unimpaired Palpation: Complains of area being tender to palpation       Provocative Tests: Lumbar Hyperextension and rotation test: evaluation deferred today       Lumbar Lateral bending test: evaluation deferred today       Patrick's Maneuver: evaluation deferred today                    Gait & Posture Assessment  Ambulation: Unassisted Gait: Relatively normal for age and body habitus Posture: WNL   Lower Extremity Exam    Side: Right lower extremity  Side: Left lower extremity  Skin & Extremity Inspection: Skin color, temperature, and hair growth are WNL. No peripheral edema or cyanosis. No masses, redness, swelling, asymmetry, or associated skin lesions. No contractures.  Skin & Extremity Inspection: Skin color, temperature, and hair growth are WNL. No peripheral edema or cyanosis. No masses, redness, swelling, asymmetry, or associated skin lesions.  No contractures.  Functional ROM: Unrestricted ROM          Functional ROM: Unrestricted ROM          Muscle Tone/Strength: Functionally intact. No obvious neuro-muscular anomalies detected.  Muscle Tone/Strength: Functionally intact. No obvious neuro-muscular anomalies detected.  Sensory (Neurological): Unimpaired  Sensory (Neurological): Unimpaired  Palpation: No palpable anomalies  Palpation: No palpable anomalies   Assessment  Primary Diagnosis & Pertinent Problem List: The primary encounter diagnosis was Chronic lumbar radicular pain (L5/S1 dermatome) (Location of Secondary source of pain) (Left). Diagnoses of Neurogenic pain, Chronic foot numbness (Left), Rheumatoid arthritis, multiple sites (positive RF), Musculoskeletal pain, and Chronic pain syndrome were also pertinent to this visit.  Status Diagnosis  Persistent Persistent Persistent 1. Chronic lumbar radicular pain (L5/S1 dermatome) (Location of Secondary source of pain) (Left)   2. Neurogenic pain   3. Chronic foot numbness (Left)   4. Rheumatoid arthritis, multiple sites (positive RF)   5. Musculoskeletal pain   6. Chronic pain syndrome     Problems updated and reviewed during this visit: No problems updated. Plan of Care  Pharmacotherapy (Medications Ordered): Meds ordered this encounter  Medications  . pramipexole (MIRAPEX) 0.5 MG tablet  Sig: Take 1 tablet (0.5 mg total) by mouth daily.    Dispense:  30 tablet    Refill:  2    Order Specific Question:   Supervising Provider    Answer:   Delano Metz [phone]  . gabapentin (NEURONTIN) 600 MG tablet    Sig: Take 1 tablet (600 mg total) by mouth QID.    Dispense:  120 tablet    Refill:  0    Do not place this medication, or any other prescription from our practice, on "Automatic Refill". Patient may have prescription filled one day early if pharmacy is closed on scheduled refill date.    Order Specific Question:   Supervising Provider    Answer:   Delano Metz [phone]   New Prescriptions   GABAPENTIN (NEURONTIN) 600 MG TABLET    Take 1 tablet (600 mg total) by mouth QID.   Medications administered today: Maria Hamilton had no medications administered during this visit. Lab-work, procedure(s), and/or referral(s): No orders of the defined types were placed in this encounter.  Imaging and/or referral(s): None  Interventional therapies: Planned, scheduled, and/or pending:   Not at this time. Will increase the Gabapentin to tolerated dose and then will consider the Nuycnta if this is not effective   Considering:  Left L2-3 translaminar lumbar epidural steroid injection #2+ left L2-3 transforaminal epidural steroid injection #2  Repeat diagnostic left sided caudal epidural steroid injection +epidurogram. Possible RACZepidurolysis of adhesions.   Palliative PRN treatment(s):  Left L2-3 translaminar lumbar epidural steroid injection #2+ left L2-3 transforaminal epidural steroid injection #2under fluoroscopic guidance and IV sedation.       Provider-requested follow-up: Return in about 4 weeks (around 02/17/2018) for MedMgmt with Me Aggie Cosier Brooke Dare).  Future Appointments  Date Time Provider Department Center  02/17/2018 11:30 AM Barbette Merino, NP [facility] None   Primary Care Physician: Glori Luis, MD Location: [facility] Facility Note by: Barbette Merino NP Date: 01/20/2018; Time: 4:03 PM  Pain Score Disclaimer: We use the NRS-11 scale. This is a self-reported, subjective measurement of pain severity with only modest accuracy. It is used primarily to identify changes within a particular patient. It must be understood that outpatient pain scales are significantly less accurate that those used for research, where they can be applied under ideal controlled circumstances with minimal exposure to variables. In reality, the score is likely to be a combination of pain intensity and pain affect, where  pain affect describes the degree of emotional arousal or changes in action readiness caused by the sensory experience of pain. Factors such as social and work situation, setting, emotional state, anxiety levels, expectation, and prior pain experience may influence pain perception and show large inter-individual differences that may also be affected by time variables.  Patient instructions provided during this appointment: Patient Instructions   ____________________________________________________________________________________________  Medication Rules  Applies to: All patients receiving prescriptions (written or electronic).  Pharmacy of record: Pharmacy where electronic prescriptions will be sent. If written prescriptions are taken to a different pharmacy, please inform the nursing staff. The pharmacy listed in the electronic medical record should be the one where you would like electronic prescriptions to be sent.  Prescription refills: Only during scheduled appointments. Applies to both, written and electronic prescriptions.  NOTE: The following applies primarily to controlled substances (Opioid* Pain Medications).   Patient's responsibilities: 1. Pain Pills: Bring all pain pills to every appointment (except for procedure appointments). 2. Pill Bottles: Bring pills in original  pharmacy bottle. Always bring newest bottle. Bring bottle, even if empty. 3. Medication refills: You are responsible for knowing and keeping track of what medications you need refilled. The day before your appointment, write a list of all prescriptions that need to be refilled. Bring that list to your appointment and give it to the admitting nurse. Prescriptions will be written only during appointments. If you forget a medication, it will not be "Called in", "Faxed", or "electronically sent". You will need to get another appointment to get these prescribed. 4. Prescription Accuracy: You are responsible for carefully  inspecting your prescriptions before leaving our office. Have the discharge nurse carefully go over each prescription with you, before taking them home. Make sure that your name is accurately spelled, that your address is correct. Check the name and dose of your medication to make sure it is accurate. Check the number of pills, and the written instructions to make sure they are clear and accurate. Make sure that you are given enough medication to last until your next medication refill appointment. 5. Taking Medication: Take medication as prescribed. Never take more pills than instructed. Never take medication more frequently than prescribed. Taking less pills or less frequently is permitted and encouraged, when it comes to controlled substances (written prescriptions).  6. Inform other Doctors: Always inform, all of your healthcare providers, of all the medications you take. 7. Pain Medication from other Providers: You are not allowed to accept any additional pain medication from any other Doctor or Healthcare provider. There are two exceptions to this rule. (see below) In the event that you require additional pain medication, you are responsible for notifying us, as stated below. 8. Medication Agreement: You are responsible for carefully reading and following our Medication Agreement. This must be signed before receiving any prescriptions from our practice. Safely store a copy of your signed Agreement. Violations to the Agreement will result in no further prescriptions. (Additional copies of our Medication Agreement are available upon request.) 9. Laws, Rules, & Regulations: All patients are expected to follow all [address] and [employer], Rules, [place] [employer]. Ignorance of the Laws does not constitute a valid excuse. The use of any illegal substances is prohibited. 10. Adopted CDC guidelines & recommendations: Target dosing levels will be at or below 60 MME/day. Use of benzodiazepines** is not  recommended.  Exceptions: There are only two exceptions to the rule of not receiving pain medications from other Healthcare Providers. 1. Exception #1 (Emergencies): In the event of an emergency (i.e.: accident requiring emergency care), you are allowed to receive additional pain medication. However, you are responsible for: As soon as you are able, call our office [phone], at any time of the day or night, and leave a message stating your name, the date and nature of the emergency, and the name and dose of the medication prescribed. In the event that your call is answered by a member of our staff, make sure to document and save the date, time, and the name of the person that took your information.  2. Exception #2 (Planned Surgery): In the event that you are scheduled by another doctor or dentist to have any type of surgery or procedure, you are allowed (for a period no longer than 30 days), to receive additional pain medication, for the acute post-op pain. However, in this case, you are responsible for picking up a copy of our "Post-op Pain Management for Surgeons" handout, and giving it to your surgeon or dentist. This document  is available at our office, and does not require an appointment to obtain it. Simply go to our office during business hours (Monday-Thursday from 8:00 AM to 4:00 PM) (Friday 8:00 AM to 12:00 Noon) or if you have a scheduled appointment with [place], prior to your surgery, and ask for it by name. In addition, you will need to provide [place] with your name, name of your surgeon, type of surgery, and date of procedure or surgery.  *Opioid medications include: morphine, codeine, oxycodone, oxymorphone, hydrocodone, hydromorphone, meperidine, tramadol, tapentadol, buprenorphine, fentanyl, methadone. **Benzodiazepine medications include: diazepam (Valium), alprazolam (Xanax), clonazepam (Klonopine), lorazepam (Ativan), clorazepate (Tranxene), chlordiazepoxide (Librium), estazolam (Prosom),  oxazepam (Serax), temazepam (Restoril), triazolam (Halcion)  ____________________________________________________________________________________________

## 2018-01-20 NOTE — Progress Notes (Signed)
Nursing Pain Medication Assessment:  Safety precautions to be maintained throughout the outpatient stay will include: orient to surroundings, keep bed in low position, maintain call bell within reach at all times, provide assistance with transfer out of bed and ambulation.  Medication Inspection Compliance: Maria Hamilton did not comply with our request to bring her pills to be counted. She was reminded that bringing the medication bottles, even when empty, is a requirement.  Medication: None brought in. Pill/Patch Count: None available to be counted. Bottle Appearance: No container available. Did not bring bottle(s) to appointment. Filled Date: N/A Last Medication intake:  Yesterday

## 2018-02-03 ENCOUNTER — Encounter: Payer: Self-pay | Admitting: Family Medicine

## 2018-02-17 ENCOUNTER — Ambulatory Visit: Payer: Medicare HMO | Attending: Nurse Practitioner | Admitting: Nurse Practitioner

## 2018-02-17 ENCOUNTER — Other Ambulatory Visit: Payer: Self-pay

## 2018-02-17 ENCOUNTER — Encounter: Payer: Self-pay | Admitting: Nurse Practitioner

## 2018-02-17 VITALS — BP 159/91 | HR 67 | Temp 98.1°F | Resp 15 | Ht 73.0 in | Wt 200.2 lb

## 2018-02-17 DIAGNOSIS — G4700 Insomnia, unspecified: Secondary | ICD-10-CM | POA: Diagnosis not present

## 2018-02-17 DIAGNOSIS — M79605 Pain in left leg: Secondary | ICD-10-CM | POA: Insufficient documentation

## 2018-02-17 DIAGNOSIS — Z880 Allergy status to penicillin: Secondary | ICD-10-CM | POA: Diagnosis not present

## 2018-02-17 DIAGNOSIS — I10 Essential (primary) hypertension: Secondary | ICD-10-CM | POA: Insufficient documentation

## 2018-02-17 DIAGNOSIS — Z79899 Other long term (current) drug therapy: Secondary | ICD-10-CM | POA: Diagnosis not present

## 2018-02-17 DIAGNOSIS — F329 Major depressive disorder, single episode, unspecified: Secondary | ICD-10-CM | POA: Insufficient documentation

## 2018-02-17 DIAGNOSIS — Z809 Family history of malignant neoplasm, unspecified: Secondary | ICD-10-CM | POA: Diagnosis not present

## 2018-02-17 DIAGNOSIS — G894 Chronic pain syndrome: Secondary | ICD-10-CM

## 2018-02-17 DIAGNOSIS — Z9049 Acquired absence of other specified parts of digestive tract: Secondary | ICD-10-CM | POA: Diagnosis not present

## 2018-02-17 DIAGNOSIS — K219 Gastro-esophageal reflux disease without esophagitis: Secondary | ICD-10-CM | POA: Diagnosis not present

## 2018-02-17 DIAGNOSIS — Z9889 Other specified postprocedural states: Secondary | ICD-10-CM | POA: Insufficient documentation

## 2018-02-17 DIAGNOSIS — M5442 Lumbago with sciatica, left side: Secondary | ICD-10-CM | POA: Diagnosis not present

## 2018-02-17 DIAGNOSIS — Z90710 Acquired absence of both cervix and uterus: Secondary | ICD-10-CM | POA: Insufficient documentation

## 2018-02-17 DIAGNOSIS — Z881 Allergy status to other antibiotic agents status: Secondary | ICD-10-CM | POA: Diagnosis not present

## 2018-02-17 DIAGNOSIS — M797 Fibromyalgia: Secondary | ICD-10-CM | POA: Insufficient documentation

## 2018-02-17 DIAGNOSIS — Z9109 Other allergy status, other than to drugs and biological substances: Secondary | ICD-10-CM | POA: Diagnosis not present

## 2018-02-17 DIAGNOSIS — Z811 Family history of alcohol abuse and dependence: Secondary | ICD-10-CM | POA: Diagnosis not present

## 2018-02-17 DIAGNOSIS — Z885 Allergy status to narcotic agent status: Secondary | ICD-10-CM | POA: Diagnosis not present

## 2018-02-17 DIAGNOSIS — E559 Vitamin D deficiency, unspecified: Secondary | ICD-10-CM | POA: Diagnosis not present

## 2018-02-17 DIAGNOSIS — G8929 Other chronic pain: Secondary | ICD-10-CM | POA: Diagnosis present

## 2018-02-17 DIAGNOSIS — Z888 Allergy status to other drugs, medicaments and biological substances status: Secondary | ICD-10-CM | POA: Diagnosis not present

## 2018-02-17 DIAGNOSIS — Z8249 Family history of ischemic heart disease and other diseases of the circulatory system: Secondary | ICD-10-CM | POA: Insufficient documentation

## 2018-02-17 DIAGNOSIS — M48061 Spinal stenosis, lumbar region without neurogenic claudication: Secondary | ICD-10-CM

## 2018-02-17 DIAGNOSIS — R208 Other disturbances of skin sensation: Secondary | ICD-10-CM | POA: Diagnosis not present

## 2018-02-17 DIAGNOSIS — M9983 Other biomechanical lesions of lumbar region: Secondary | ICD-10-CM | POA: Diagnosis not present

## 2018-02-17 DIAGNOSIS — Z91040 Latex allergy status: Secondary | ICD-10-CM | POA: Diagnosis not present

## 2018-02-17 DIAGNOSIS — R200 Anesthesia of skin: Secondary | ICD-10-CM

## 2018-02-17 MED ORDER — GABAPENTIN 600 MG PO TABS: 600.0000 mg | ORAL_TABLET | Freq: Every day | ORAL | 0 refills | Status: DC

## 2018-02-17 MED ORDER — ORPHENADRINE CITRATE 30 MG/ML IJ SOLN
60.0000 mg | Freq: Once | INTRAMUSCULAR | Status: AC
  Administered 2018-02-17: 60 mg via INTRAMUSCULAR
  Filled 2018-02-17: qty 2

## 2018-02-17 MED ORDER — KETOROLAC TROMETHAMINE 60 MG/2ML IM SOLN
60.0000 mg | Freq: Once | INTRAMUSCULAR | Status: AC
  Administered 2018-02-17: 60 mg via INTRAMUSCULAR
  Filled 2018-02-17: qty 2

## 2018-02-17 NOTE — Progress Notes (Signed)
Safety precautions to be maintained throughout the outpatient stay will include: orient to surroundings, keep bed in low position, maintain call bell within reach at all times, provide assistance with transfer out of bed and ambulation.  

## 2018-02-17 NOTE — Progress Notes (Signed)
Patient's Name: Maria Hamilton  MRN: [id number]  Referring Provider: Glori Luis, MD  DOB: 08/05/1951  PCP: Maria Luis, MD  DOS: 02/17/2018  Note by: Maria Merino NP  Service setting: Ambulatory outpatient  Specialty: Interventional Pain Management  Location: [facility] (AMB) Pain Management Facility    Patient type: Established    Primary Reason(s) for Visit: Encounter for prescription drug management. (Level of risk: moderate)  CC: Back Pain; Leg Pain (left); and Foot Pain (left, numb)  HPI  [name] is a 67 y.o. year old, female patient, who comes today for a medication management evaluation. She has GERD (gastroesophageal reflux disease); Rheumatoid factor positive; Chronic fatigue; Vitamin D deficiency; Insomnia; Fibromyalgia; Chronic low back pain (Location of Primary Source of Pain) (Left); Chronic pain syndrome; Depression; Hypertension; 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); Long term current use of opiate analgesic; Long term prescription opiate use; Opiate use; Encounter for therapeutic drug level monitoring; Encounter for pain management planning; Rheumatoid arthritis, multiple sites (positive RF); Chronic lower extremity pain (Location of Secondary source of pain) (Left); Lumbar facet arthropathy (Bilateral); Failed back surgical syndrome (x 3); Epidural fibrosis; Neurogenic pain; Musculoskeletal pain; Chronic lumbar radicular pain (L5/S1 dermatome) (Location of Secondary source of pain) (Left); History of total hip replacement, bilateral; Hypokalemia; Constipation; Therapeutic opioid-induced constipation (OIC); Palpitations; Epigastric pain; Memory difficulty; Polyneuropathy; Post herpetic neuralgia; Abnormal MRI, lumbar spine (03/06/2017); 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); History of lumbar fusion (L3-4, L4-5, and L5-S1); Vertigo; Chronic foot numbness (Left); Night sweats; Supraclavicular fossa fullness; RLS (restless legs  syndrome); Ankle pain; Anxiety; Viral URI with cough; History of allergy to radiographic contrast media; Degeneration of lumbar intervertebral disc; and Osteoarthritis of hip on their problem list. Her primarily concern today is the Back Pain; Leg Pain (left); and Foot Pain (left, numb)  Pain Assessment: Location: Lower Back Radiating: moves through left hip down back of left leg to foot including all toes Onset: More than a month ago Duration: Chronic pain Quality: Burning, Numbness, Constant("feels like my leg is going to explode") Severity: 10-Worst pain ever/10 (self-reported pain score)  Note: Reported level is compatible with observation. Clinically the patient looks like a 3/10 A 3/10 is viewed as "Moderate" and described as significantly interfering with activities of daily living (ADL). It becomes difficult to feed, bathe, get dressed, get on and off the toilet or to perform personal hygiene functions. Difficult to get in and out of bed or a chair without assistance. Very distracting. With effort, it can be ignored when deeply involved in activities. Exaggerated score may be due to the reporting of a "suffering" component. When using our objective Pain Scale, levels between 6 and 10/10 are said to belong in an emergency room, as it progressively worsens from a 6/10, described as severely limiting, requiring emergency care not usually available at an outpatient pain management facility. At a 6/10 level, communication becomes difficult and requires great effort. Assistance to reach the emergency department may be required. Facial flushing and profuse sweating along with potentially dangerous increases in heart rate and blood pressure will be evident. Effect on ADL: hard to anything except lay in the bed Timing: Constant Modifying factors: nothing is helping  Maria Hamilton was last scheduled for an appointment on 01/20/2018 for medication management. During today's appointment we reviewed [name] chronic pain status, as well as her outpatient medication regimen. She admits that she is having increased pain today. She is has failed all medication regimens  except [place] and this is too expensive. She has failed multiple procedures. Her husband is concern with what can be done. She is SP spinal fusion with revision. Her husband admits that she has increased scar tissue the Racz procedure she had 3 days of pain relief. She admits that she has not had SCS. She has not be introduced to a pain pump. .  The patient  reports that she does not use drugs. Her body mass index is 26.41 kg/m.  Further details on both, my assessment(s), as well as the proposed treatment plan, please see below.  Controlled Substance Pharmacotherapy Assessment REMS (Risk Evaluation and Mitigation Strategy)  Analgesic: None MME/day: 0mg /day.  Maria Hamilton  02/17/2018 11:38 AM  Signed Safety precautions to be maintained throughout the outpatient stay will include: orient to surroundings, keep bed in low position, maintain call bell within reach at all times, provide assistance with transfer out of bed and ambulation.    Pharmacokinetics: Liberation and absorption (onset of action): WNL Distribution (time to peak effect): WNL Metabolism and excretion (duration of action): WNL         Pharmacodynamics: Desired effects: Analgesia: Maria Hamilton reports >50% benefit. Functional ability: Patient reports that medication allows her to accomplish basic ADLs Clinically meaningful improvement in function (CMIF): Sustained CMIF goals met Perceived effectiveness: Described as relatively effective, allowing for increase in activities of daily living (ADL) Undesirable effects: Side-effects or Adverse reactions: None reported Monitoring: Maria Hamilton PMP: Online review of the past 1-month period conducted. Compliant with practice rules and regulations Last UDS on record: Summary  Date Value Ref Range Status  09/24/2016 FINAL   Final    Comment:    ==================================================================== TOX[facility] COMP DRUG ANALYSIS,UR ==================================================================== Test                             Result       Flag       Units Drug Present and Declared for Prescription Verification   Hydrocodone                    1294         EXPECTED   ng/mg creat   Hydromorphone                  104          EXPECTED   ng/mg creat   Dihydrocodeine                 420          EXPECTED   ng/mg creat   Norhydrocodone                 >1142        EXPECTED   ng/mg creat    Sources of hydrocodone include scheduled prescription    medications. Hydromorphone, dihydrocodeine and norhydrocodone are    expected metabolites of hydrocodone. Hydromorphone and    dihydrocodeine are also available as scheduled prescription    medications.   Oxymorphone                    39           EXPECTED   ng/mg creat   Noroxymorphone                 19           EXPECTED   ng/mg creat    Sources  of oxymorphone are scheduled prescription medications;    oxymorphone is also a metabolite of oxycodone. Noroxymorphone is    an expected metabolite of oxymorphone.   Citalopram                     PRESENT      EXPECTED   Desmethylcitalopram            PRESENT      EXPECTED    Desmethylcitalopram is an expected metabolite of citalopram or    the enantiomeric form, escitalopram.   Acetaminophen                  PRESENT      EXPECTED Drug Present not Declared for Prescription Verification   Orphenadrine                   PRESENT      UNEXPECTED   Ibuprofen                      PRESENT      UNEXPECTED Drug Absent but Declared for Prescription Verification   Oxycodone                      Not Detected UNEXPECTED ng/mg creat    Oxycodone is almost always present in patients taking this drug    consistently.  Absence of oxycodone could be due to lapse of time    since the last dose or unusual pharmacokinetics  (rapid    metabolism).   Hydroxyzine                    Not Detected UNEXPECTED ==================================================================== Test                      Result    Flag   Units      Ref Range   Creatinine              438              mg/dL      >=29 ==================================================================== Declared Medications:  The flagging and interpretation on this report are based on the  following declared medications.  Unexpected results may arise from  inaccuracies in the declared medications.  **Note: The testing scope of this panel includes these medications:  Citalopram (Lexapro)  Hydrocodone (Norco)  Hydroxyzine (Atarax)  Oxycodone (Percocet)  **Note: The testing scope of this panel does not include small to  moderate amounts of these reported medications:  Acetaminophen (Norco)  Acetaminophen (Percocet)  **Note: The testing scope of this panel does not include following  reported medications:  Amlodipine (Norvasc)  Hydrochlorothiazide (Hydrodiuril)  Meclizine (Antivert)  Methylprednisolone (Medrol Dose Pack)  Potassium ==================================================================== For clinical consultation, please call [phone]. ====================================================================    UDS interpretation: Compliant          Medication Assessment Form: Reviewed. Patient indicates being compliant with therapy Treatment compliance: Compliant Risk Assessment Profile: Aberrant behavior: See prior evaluations. None observed or detected today Comorbid factors increasing risk of overdose: See prior notes. No additional risks detected today Risk of substance use disorder (SUD): Low Opioid Risk Tool - 02/17/18 1136      Family History of Substance Abuse   Alcohol  Positive Female    Illegal Drugs  Negative    Rx Drugs  Negative      Personal History of Substance Abuse   Alcohol  Negative    Illegal Drugs   Negative    Rx Drugs  Negative      Age   Age between 92-45 years   No      Psychological Disease   Psychological Disease  Negative    Depression  Positive      Total Score   Opioid Risk Tool Scoring  2    Opioid Risk Interpretation  Low Risk      ORT Scoring interpretation table:  Score <3 = Low Risk for SUD  Score between 4-7 = Moderate Risk for SUD  Score >8 = High Risk for Opioid Abuse   Risk Mitigation Strategies:  Patient Counseling: Covered Patient-Prescriber Agreement (PPA): Present and active  Notification to other healthcare providers: Done  Pharmacologic Plan: No change in therapy, at this time.             Laboratory Chemistry  Inflammation Markers (CRP: Acute Phase) (ESR: Chronic Phase) Lab Results  Component Value Date   CRP <0.8 09/25/2016   ESRSEDRATE 17 08/13/2017   LATICACIDVEN 1.0 10/24/2016                         Rheumatology Markers No results found for: RF, ANA, LABURIC, URICUR, LYMEIGGIGMAB, LYMEABIGMQN              Renal Function Markers Lab Results  Component Value Date   BUN 5 (L) 11/22/2017   CREATININE 0.73 11/22/2017   GFRAA >60 11/15/2016   GFRNONAA >60 11/15/2016                 Hepatic Function Markers Lab Results  Component Value Date   AST 15 11/22/2017   ALT 8 11/22/2017   ALBUMIN 3.9 11/22/2017   ALKPHOS 100 11/22/2017   LIPASE 9.0 (L) 11/26/2016                 Electrolytes Lab Results  Component Value Date   NA 135 11/22/2017   K 3.6 12/04/2017   CL 98 11/22/2017   CALCIUM 9.0 11/22/2017   MG 1.8 11/22/2017                        Neuropathy Markers Lab Results  Component Value Date   VITAMINB12 220 11/22/2017   HIV NONREACTIVE 03/06/2017                 Bone Pathology Markers Lab Results  Component Value Date   25OHVITD1 14 (L) 09/25/2016   25OHVITD2 6.4 09/25/2016   25OHVITD3 7.7 09/25/2016                         Coagulation Parameters Lab Results  Component Value Date   PLT 326.0  11/22/2017                 Cardiovascular Markers Lab Results  Component Value Date   TROPONINI <0.03 07/13/2016   HGB 12.1 11/22/2017   HCT 36.8 11/22/2017                 CA Markers No results found for: CEA, CA125, LABCA2               Note: Lab results reviewed.  Recent Diagnostic Imaging Results  DG C-Arm 1-60 Min-No Report Fluoroscopy was utilized by the requesting physician.  No radiographic  interpretation.   Complexity Note: Imaging results reviewed. Results shared with Maria Hamilton,  using Layman's terms.                         Meds   Current Outpatient Medications:  .  amLODipine (NORVASC) 10 MG tablet, Take 1 tablet (10 mg total) by mouth daily., Disp: 90 tablet, Rfl: 3 .  gabapentin (NEURONTIN) 600 MG tablet, Take 1 tablet (600 mg total) by mouth at bedtime., Disp: 120 tablet, Rfl: 0 .  ibuprofen (ADVIL,MOTRIN) 600 MG tablet, Take 1 tablet (600 mg total) by mouth every 8 (eight) hours as needed., Disp: 15 tablet, Rfl: 0 .  pramipexole (MIRAPEX) 0.5 MG tablet, Take 1 tablet (0.5 mg total) by mouth daily., Disp: 30 tablet, Rfl: 2 .  sertraline (ZOLOFT) 50 MG tablet, Take 1 tablet (50 mg total) by mouth daily., Disp: 90 tablet, Rfl: 3 .  tizanidine (ZANAFLEX) 2 MG capsule, Take 1 capsule (2 mg total) by mouth 3 (three) times daily as needed for muscle spasms., Disp: 90 capsule, Rfl: 5  ROS  Constitutional: Denies any fever or chills Gastrointestinal: No reported hemesis, hematochezia, vomiting, or acute GI distress Musculoskeletal: Denies any acute onset joint swelling, redness, loss of ROM, or weakness Neurological: No reported episodes of acute onset apraxia, aphasia, dysarthria, agnosia, amnesia, paralysis, loss of coordination, or loss of consciousness  Allergies  Maria Hamilton is allergic to ace inhibitors; ciprofloxacin; fentanyl; latex; morphine and related; penicillin g benzathine; isovue m [iopamidol]; and tape.  PFSH  Drug: Maria Hamilton  reports that she  does not use drugs. Alcohol:  reports that she does not drink alcohol. Tobacco:  reports that  has never smoked. she has never used smokeless tobacco. Medical:  has a past medical history of Acute anxiety (08/03/2015), Acute bronchitis (12/31/2016), Acute pancreatitis (10/14/2016), Altered mental status (10/12/2016), Anxiety, Bronchitis, Chronic hip pain, Chronic pain syndrome, Depression, Edema, Fatigue, Fibromyalgia, GERD (gastroesophageal reflux disease), Headache, Hypertension, Insomnia, Low back pain, Right upper quadrant abdominal pain, and Vitamin D deficiency. Surgical: Maria Hamilton  has a past surgical history that includes Breast surgery; Back surgery; Ectopic pregnancy surgery; Hip surgery; Abdominal hysterectomy; cyst removal from wrist; and Cholecystectomy (N/A, 10/16/2016). Family: family history includes Alcohol abuse in her father; Cancer in her father; Heart disease in her mother.  Constitutional Exam  General appearance: Well nourished, well developed, and well hydrated. In no apparent acute distress Vitals:   02/17/18 1125  BP: (!) 159/91  Pulse: 67  Resp: 15  Temp: 98.1 F (36.7 C)  TempSrc: Oral  SpO2: 100%  Weight: 200 lb 3.2 oz (90.8 kg)  Height: 6\' 1"  (1.854 m)   BMI Assessment: Estimated body mass index is 26.41 kg/m as calculated from the following:   Height as of this encounter: 6\' 1"  (1.854 m).   Weight as of this encounter: 200 lb 3.2 oz (90.8 kg).  BMI interpretation table: BMI level Category Range association with higher incidence of chronic pain  <18 kg/m2 Underweight   18.5-24.9 kg/m2 Ideal body weight   25-29.9 kg/m2 Overweight Increased incidence by 20%  30-34.9 kg/m2 Obese (Class I) Increased incidence by 68%  35-39.9 kg/m2 Severe obesity (Class II) Increased incidence by 136%  >40 kg/m2 Extreme obesity (Class III) Increased incidence by 254%   BMI Readings from Last 4 Encounters:  02/17/18 26.41 kg/m  01/20/18 25.60 kg/m  01/02/18 26.78 kg/m   11/22/17 26.78 kg/m   Wt Readings from Last 4 Encounters:  02/17/18 200 lb 3.2 oz (90.8 kg)  01/20/18 194 lb (88  kg)  01/02/18 203 lb (92.1 kg)  11/22/17 203 lb (92.1 kg)  Psych/Mental status: Alert, oriented x 3 (person, place, & time)       Eyes: PERLA Respiratory: No evidence of acute respiratory distress  Cervical Spine Area Exam  Skin & Axial Inspection: No masses, redness, edema, swelling, or associated skin lesions Alignment: Symmetrical Functional ROM: Unrestricted ROM      Stability: No instability detected Muscle Tone/Strength: Functionally intact. No obvious neuro-muscular anomalies detected. Sensory (Neurological): Unimpaired Palpation: No palpable anomalies              Upper Extremity (UE) Exam    Side: Right upper extremity  Side: Left upper extremity  Skin & Extremity Inspection: Skin color, temperature, and hair growth are WNL. No peripheral edema or cyanosis. No masses, redness, swelling, asymmetry, or associated skin lesions. No contractures.  Skin & Extremity Inspection: Skin color, temperature, and hair growth are WNL. No peripheral edema or cyanosis. No masses, redness, swelling, asymmetry, or associated skin lesions. No contractures.  Functional ROM: Unrestricted ROM          Functional ROM: Unrestricted ROM          Muscle Tone/Strength: Functionally intact. No obvious neuro-muscular anomalies detected.  Muscle Tone/Strength: Functionally intact. No obvious neuro-muscular anomalies detected.  Sensory (Neurological): Unimpaired          Sensory (Neurological): Unimpaired          Palpation: No palpable anomalies              Palpation: No palpable anomalies              Specialized Test(s): Deferred         Specialized Test(s): Deferred          Thoracic Spine Area Exam  Skin & Axial Inspection: No masses, redness, or swelling Alignment: Symmetrical Functional ROM: Unrestricted ROM Stability: No instability detected Muscle Tone/Strength: Functionally  intact. No obvious neuro-muscular anomalies detected. Sensory (Neurological): Unimpaired Muscle strength & Tone: No palpable anomalies  Lumbar Spine Area Exam  Skin & Axial Inspection: Well healed scar from previous spine surgery detected Alignment: Symmetrical Functional ROM: Unrestricted ROM      Stability: No instability detected Muscle Tone/Strength: Functionally intact. No obvious neuro-muscular anomalies detected. Sensory (Neurological): Unimpaired Palpation: Complains of area being tender to palpation       Provocative Tests: Lumbar Hyperextension and rotation test: evaluation deferred today       Lumbar Lateral bending test: evaluation deferred today       Patrick's Maneuver: evaluation deferred today                    Gait & Posture Assessment  Ambulation: Unassisted Gait: Relatively normal for age and body habitus Posture: WNL   Lower Extremity Exam    Side: Right lower extremity  Side: Left lower extremity  Skin & Extremity Inspection: Skin color, temperature, and hair growth are WNL. No peripheral edema or cyanosis. No masses, redness, swelling, asymmetry, or associated skin lesions. No contractures.  Skin & Extremity Inspection: Skin color, temperature, and hair growth are WNL. No peripheral edema or cyanosis. No masses, redness, swelling, asymmetry, or associated skin lesions. No contractures.  Functional ROM: Unrestricted ROM          Functional ROM: Adequate ROM          Muscle Tone/Strength: Functionally intact. No obvious neuro-muscular anomalies detected.  Muscle Tone/Strength: Functionally intact. No obvious neuro-muscular anomalies detected.  Sensory (  Neurological): Unimpaired  Sensory (Neurological): Hyperesthesia (Increased sensitivity to touch)  Palpation: No palpable anomalies  Palpation: No palpable anomalies   Assessment  Primary Diagnosis & Pertinent Problem List: The primary encounter diagnosis was Chronic low back pain (Location of Primary Source of  Pain) (Left). Diagnoses of Chronic lower extremity pain (Location of Secondary source of pain) (Left), 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), Chronic foot numbness (Left), and Chronic pain syndrome were also pertinent to this visit.  Status Diagnosis  Worsening Not responding Worsening 1. Chronic low back pain (Location of Primary Source of Pain) (Left)   2. Chronic lower extremity pain (Location of Secondary source of pain) (Left)   3. 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)   4. Chronic foot numbness (Left)   5. Chronic pain syndrome     Problems updated and reviewed during this visit: No problems updated. Plan of Care  Pharmacotherapy (Medications Ordered): Meds ordered this encounter  Medications  . gabapentin (NEURONTIN) 600 MG tablet    Sig: Take 1 tablet (600 mg total) by mouth at bedtime.    Dispense:  120 tablet    Refill:  0    Do not place this medication, or any other prescription from our practice, on "Automatic Refill". Patient may have prescription filled one day early if pharmacy is closed on scheduled refill date.    Order Specific Question:   Supervising Provider    Answer:   Delano Metz [phone]  . orphenadrine (NORFLEX) injection 60 mg  . ketorolac (TORADOL) injection 60 mg   New Prescriptions   No medications on file   Medications administered today: We administered orphenadrine and ketorolac. Lab-work, procedure(s), and/or referral(s): Orders Placed This Encounter  Procedures  . Ambulatory referral to Neurosurgery   Imaging and/or referral(s): AMB REFERRAL TO NEUROSURGERY  Interventional therapies: Planned, scheduled, and/or pending:   Not at this time. Neurosurgery referral for evaluation of possible surgical intervention. If this is not an option then patient was given information on SCS and Pain pump.    Considering:  Left L2-3 translaminar lumbar epidural steroid injection #2+ left L2-3  transforaminal epidural steroid injection #2  Repeat diagnostic left sided caudal epidural steroid injection +epidurogram. Possible RACZepidurolysis of adhesions.   Palliative PRN treatment(s):  Left L2-3 translaminar lumbar epidural steroid injection #2+ left L2-3 transforaminal epidural steroid injection #2under fluoroscopic guidance and IV sedation.    Provider-requested follow-up: Return in about 3 months (around 05/20/2018) for MedMgmt with Me Aggie Cosier Brooke Dare).  Future Appointments  Date Time Provider Department Center  05/19/2018  9:00 AM Maria Merino, NP [facility] None   Primary Care Physician: Maria Luis, MD Location: [facility] Note by: Maria Merino NP Date: 02/17/2018; Time: 3:04 PM  Pain Score Disclaimer: We use the NRS-11 scale. This is a self-reported, subjective measurement of pain severity with only modest accuracy. It is used primarily to identify changes within a particular patient. It must be understood that outpatient pain scales are significantly less accurate that those used for research, where they can be applied under ideal controlled circumstances with minimal exposure to variables. In reality, the score is likely to be a combination of pain intensity and pain affect, where pain affect describes the degree of emotional arousal or changes in action readiness caused by the sensory experience of pain. Factors such as social and work situation, setting, emotional state, anxiety levels, expectation, and prior pain experience may influence pain perception and show large  inter-individual differences that may also be affected by time variables.  Patient instructions provided during this appointment: Patient Instructions  ____________________________________________________________________________________________  Medication Rules  Applies to: All patients receiving prescriptions (written or electronic).  Pharmacy of record:  Pharmacy where electronic prescriptions will be sent. If written prescriptions are taken to a different pharmacy, please inform the nursing staff. The pharmacy listed in the electronic medical record should be the one where you would like electronic prescriptions to be sent.  Prescription refills: Only during scheduled appointments. Applies to both, written and electronic prescriptions.  NOTE: The following applies primarily to controlled substances (Opioid* Pain Medications).   Patient's responsibilities: 1. Pain Pills: Bring all pain pills to every appointment (except for procedure appointments). 2. Pill Bottles: Bring pills in original pharmacy bottle. Always bring newest bottle. Bring bottle, even if empty. 3. Medication refills: You are responsible for knowing and keeping track of what medications you need refilled. The day before your appointment, write a list of all prescriptions that need to be refilled. Bring that list to your appointment and give it to the admitting nurse. Prescriptions will be written only during appointments. If you forget a medication, it will not be "Called in", "Faxed", or "electronically sent". You will need to get another appointment to get these prescribed. 4. Prescription Accuracy: You are responsible for carefully inspecting your prescriptions before leaving our office. Have the discharge nurse carefully go over each prescription with you, before taking them home. Make sure that your name is accurately spelled, that your address is correct. Check the name and dose of your medication to make sure it is accurate. Check the number of pills, and the written instructions to make sure they are clear and accurate. Make sure that you are given enough medication to last until your next medication refill appointment. 5. Taking Medication: Take medication as prescribed. Never take more pills than instructed. Never take medication more frequently than prescribed. Taking less pills  or less frequently is permitted and encouraged, when it comes to controlled substances (written prescriptions).  6. Inform other Doctors: Always inform, all of your healthcare providers, of all the medications you take. 7. Pain Medication from other Providers: You are not allowed to accept any additional pain medication from any other Doctor or Healthcare provider. There are two exceptions to this rule. (see below) In the event that you require additional pain medication, you are responsible for notifying us, as stated below. 8. Medication Agreement: You are responsible for carefully reading and following our Medication Agreement. This must be signed before receiving any prescriptions from our practice. Safely store a copy of your signed Agreement. Violations to the Agreement will result in no further prescriptions. (Additional copies of our Medication Agreement are available upon request.) 9. Laws, Rules, & Regulations: All patients are expected to follow all [address] and [employer], Rules, [place] [employer]. Ignorance of the Laws does not constitute a valid excuse. The use of any illegal substances is prohibited. 10. Adopted CDC guidelines & recommendations: Target dosing levels will be at or below 60 MME/day. Use of benzodiazepines** is not recommended.  Exceptions: There are only two exceptions to the rule of not receiving pain medications from other Healthcare Providers. 1. Exception #1 (Emergencies): In the event of an emergency (i.e.: accident requiring emergency care), you are allowed to receive additional pain medication. However, you are responsible for: As soon as you are able, call our office [phone], at any time of the day or night, and leave  a message stating your name, the date and nature of the emergency, and the name and dose of the medication prescribed. In the event that your call is answered by a member of our staff, make sure to document and save the date, time, and the name  of the person that took your information.  2. Exception #2 (Planned Surgery): In the event that you are scheduled by another doctor or dentist to have any type of surgery or procedure, you are allowed (for a period no longer than 30 days), to receive additional pain medication, for the acute post-op pain. However, in this case, you are responsible for picking up a copy of our "Post-op Pain Management for Surgeons" handout, and giving it to your surgeon or dentist. This document is available at our office, and does not require an appointment to obtain it. Simply go to our office during business hours (Monday-Thursday from 8:00 AM to 4:00 PM) (Friday 8:00 AM to 12:00 Noon) or if you have a scheduled appointment with [place], prior to your surgery, and ask for it by name. In addition, you will need to provide [place] with your name, name of your surgeon, type of surgery, and date of procedure or surgery.  *Opioid medications include: morphine, codeine, oxycodone, oxymorphone, hydrocodone, hydromorphone, meperidine, tramadol, tapentadol, buprenorphine, fentanyl, methadone. **Benzodiazepine medications include: diazepam (Valium), alprazolam (Xanax), clonazepam (Klonopine), lorazepam (Ativan), clorazepate (Tranxene), chlordiazepoxide (Librium), estazolam (Prosom), oxazepam (Serax), temazepam (Restoril), triazolam (Halcion) (Last updated: 02/13/2018) ____________________________________________________________________________________________

## 2018-02-17 NOTE — Patient Instructions (Signed)
____________________________________________________________________________________________  Medication Rules  Applies to: All patients receiving prescriptions (written or electronic).  Pharmacy of record: Pharmacy where electronic prescriptions will be sent. If written prescriptions are taken to a different pharmacy, please inform the nursing staff. The pharmacy listed in the electronic medical record should be the one where you would like electronic prescriptions to be sent.  Prescription refills: Only during scheduled appointments. Applies to both, written and electronic prescriptions.  NOTE: The following applies primarily to controlled substances (Opioid* Pain Medications).   Patient's responsibilities: 1. Pain Pills: Bring all pain pills to every appointment (except for procedure appointments). 2. Pill Bottles: Bring pills in original pharmacy bottle. Always bring newest bottle. Bring bottle, even if empty. 3. Medication refills: You are responsible for knowing and keeping track of what medications you need refilled. The day before your appointment, write a list of all prescriptions that need to be refilled. Bring that list to your appointment and give it to the admitting nurse. Prescriptions will be written only during appointments. If you forget a medication, it will not be "Called in", "Faxed", or "electronically sent". You will need to get another appointment to get these prescribed. 4. Prescription Accuracy: You are responsible for carefully inspecting your prescriptions before leaving our office. Have the discharge nurse carefully go over each prescription with you, before taking them home. Make sure that your name is accurately spelled, that your address is correct. Check the name and dose of your medication to make sure it is accurate. Check the number of pills, and the written instructions to make sure they are clear and accurate. Make sure that you are given enough medication to last  until your next medication refill appointment. 5. Taking Medication: Take medication as prescribed. Never take more pills than instructed. Never take medication more frequently than prescribed. Taking less pills or less frequently is permitted and encouraged, when it comes to controlled substances (written prescriptions).  6. Inform other Doctors: Always inform, all of your healthcare providers, of all the medications you take. 7. Pain Medication from other Providers: You are not allowed to accept any additional pain medication from any other Doctor or Healthcare provider. There are two exceptions to this rule. (see below) In the event that you require additional pain medication, you are responsible for notifying us, as stated below. 8. Medication Agreement: You are responsible for carefully reading and following our Medication Agreement. This must be signed before receiving any prescriptions from our practice. Safely store a copy of your signed Agreement. Violations to the Agreement will result in no further prescriptions. (Additional copies of our Medication Agreement are available upon request.) 9. Laws, Rules, & Regulations: All patients are expected to follow all Federal and State Laws, Statutes, Rules, & Regulations. Ignorance of the Laws does not constitute a valid excuse. The use of any illegal substances is prohibited. 10. Adopted CDC guidelines & recommendations: Target dosing levels will be at or below 60 MME/day. Use of benzodiazepines** is not recommended.  Exceptions: There are only two exceptions to the rule of not receiving pain medications from other Healthcare Providers. 1. Exception #1 (Emergencies): In the event of an emergency (i.e.: accident requiring emergency care), you are allowed to receive additional pain medication. However, you are responsible for: As soon as you are able, call our office [phone], at any time of the day or night, and leave a message stating your name, the  date and nature of the emergency, and the name and dose of the medication   prescribed. In the event that your call is answered by a member of our staff, make sure to document and save the date, time, and the name of the person that took your information.  2. Exception #2 (Planned Surgery): In the event that you are scheduled by another doctor or dentist to have any type of surgery or procedure, you are allowed (for a period no longer than 30 days), to receive additional pain medication, for the acute post-op pain. However, in this case, you are responsible for picking up a copy of our "Post-op Pain Management for Surgeons" handout, and giving it to your surgeon or dentist. This document is available at our office, and does not require an appointment to obtain it. Simply go to our office during business hours (Monday-Thursday from 8:00 AM to 4:00 PM) (Friday 8:00 AM to 12:00 Noon) or if you have a scheduled appointment with us, prior to your surgery, and ask for it by name. In addition, you will need to provide us with your name, name of your surgeon, type of surgery, and date of procedure or surgery.  *Opioid medications include: morphine, codeine, oxycodone, oxymorphone, hydrocodone, hydromorphone, meperidine, tramadol, tapentadol, buprenorphine, fentanyl, methadone. **Benzodiazepine medications include: diazepam (Valium), alprazolam (Xanax), clonazepam (Klonopine), lorazepam (Ativan), clorazepate (Tranxene), chlordiazepoxide (Librium), estazolam (Prosom), oxazepam (Serax), temazepam (Restoril), triazolam (Halcion) (Last updated: 02/13/2018) ____________________________________________________________________________________________    

## 2018-02-20 ENCOUNTER — Other Ambulatory Visit: Payer: Self-pay | Admitting: Nurse Practitioner

## 2018-02-26 ENCOUNTER — Telehealth: Payer: Self-pay | Admitting: Family Medicine

## 2018-02-26 NOTE — Telephone Encounter (Signed)
It does not appear that I have received anything.  Please contact them so they can refax it.  Thanks.

## 2018-02-26 NOTE — Telephone Encounter (Signed)
Left message to refax 

## 2018-02-26 NOTE — Telephone Encounter (Signed)
Copied from [place] [id number]. Topic: Quick Communication - See Telephone Encounter >> Feb 26, 2018 12:02 PM Cleaster Corin, NT wrote: CRM for notification. See Telephone encounter for:   02/26/18. Jody calling from [place] calling to see if fax has been received from march 5th . Jeral Fruit can be reached at [phone] fax number [phone]

## 2018-02-26 NOTE — Telephone Encounter (Signed)
Have you received anything?

## 2018-03-07 ENCOUNTER — Telehealth: Payer: Self-pay | Admitting: *Deleted

## 2018-03-07 NOTE — Telephone Encounter (Signed)
Spoke with Mr Janosik to let him know that we do not have a provider in today but that I would put this is [facility]'s box for fill on Monday.

## 2018-03-10 ENCOUNTER — Other Ambulatory Visit: Payer: Self-pay | Admitting: Nurse Practitioner

## 2018-03-10 DIAGNOSIS — M7918 Myalgia, other site: Secondary | ICD-10-CM

## 2018-03-10 MED ORDER — TIZANIDINE HCL 2 MG PO CAPS: 2.0000 mg | ORAL_CAPSULE | Freq: Three times a day (TID) | ORAL | 5 refills | Status: DC | PRN

## 2018-03-10 MED ORDER — PRAMIPEXOLE DIHYDROCHLORIDE 0.5 MG PO TABS: 0.5000 mg | ORAL_TABLET | Freq: Every day | ORAL | 2 refills | Status: DC

## 2018-03-10 NOTE — Telephone Encounter (Signed)
sent 

## 2018-03-10 NOTE — Telephone Encounter (Signed)
I do not see anything regarding this.  Was it placed in 1 of my folders?  Thanks.

## 2018-03-10 NOTE — Telephone Encounter (Signed)
Please advise 

## 2018-03-10 NOTE — Telephone Encounter (Signed)
Jody from [place] called to see if fax was received regarding RA diagnosis and treatment. Please call [phone] to let her know if this was received

## 2018-03-11 NOTE — Telephone Encounter (Signed)
Left detailed voicemail asking her to refax.

## 2018-03-26 NOTE — Telephone Encounter (Signed)
Please be on the lookout for this fax from [place] with [facility] so this can be completed and returned.

## 2018-03-26 NOTE — Telephone Encounter (Signed)
Jody calling from [facility] and will refax today. Please complete it and fax it back with office notes to support diagnosis. Jeral Fruit can be reached at [phone] and her fax number is [phone].

## 2018-03-27 ENCOUNTER — Telehealth: Payer: Self-pay | Admitting: Family Medicine

## 2018-03-27 NOTE — Telephone Encounter (Signed)
See other message

## 2018-03-27 NOTE — Telephone Encounter (Signed)
Please check with the patient to see if she has been diagnosed with rheumatoid arthritis.  Cannot find any lab work indicating this it appears this was placed on her chart by her pain specialist in the past.  She is not currently on any treatment and if she does have rheumatoid arthritis we will need to refer to rheumatology.

## 2018-03-27 NOTE — Telephone Encounter (Signed)
Given to Dr.Sonnenberg 

## 2018-03-27 NOTE — Telephone Encounter (Signed)
Please contact the patient and see if she has been diagnosed with rheumatoid arthritis.  We received something from her insurance company stating that there has been a diagnosis regarding this.  Thanks.

## 2018-03-27 NOTE — Telephone Encounter (Signed)
Patients husband states he is not aware that patient has this but will have the patient call back to let us know

## 2018-03-29 NOTE — Telephone Encounter (Signed)
We will await her call back. If she does not call back by Tuesday please attempt to call her again. Thanks.

## 2018-04-02 ENCOUNTER — Ambulatory Visit (INDEPENDENT_AMBULATORY_CARE_PROVIDER_SITE_OTHER): Payer: Medicare HMO | Admitting: Family Medicine

## 2018-04-02 ENCOUNTER — Other Ambulatory Visit: Payer: Self-pay | Admitting: Family Medicine

## 2018-04-02 ENCOUNTER — Other Ambulatory Visit: Payer: Self-pay

## 2018-04-02 VITALS — BP 134/86 | HR 69 | Temp 98.3°F | Wt 202.6 lb

## 2018-04-02 DIAGNOSIS — F329 Major depressive disorder, single episode, unspecified: Secondary | ICD-10-CM

## 2018-04-02 DIAGNOSIS — M791 Myalgia, unspecified site: Secondary | ICD-10-CM

## 2018-04-02 DIAGNOSIS — G4700 Insomnia, unspecified: Secondary | ICD-10-CM | POA: Diagnosis not present

## 2018-04-02 DIAGNOSIS — E538 Deficiency of other specified B group vitamins: Secondary | ICD-10-CM | POA: Diagnosis not present

## 2018-04-02 DIAGNOSIS — E041 Nontoxic single thyroid nodule: Secondary | ICD-10-CM

## 2018-04-02 DIAGNOSIS — F32A Depression, unspecified: Secondary | ICD-10-CM

## 2018-04-02 DIAGNOSIS — R61 Generalized hyperhidrosis: Secondary | ICD-10-CM

## 2018-04-02 DIAGNOSIS — Z1239 Encounter for other screening for malignant neoplasm of breast: Secondary | ICD-10-CM

## 2018-04-02 DIAGNOSIS — M25512 Pain in left shoulder: Secondary | ICD-10-CM | POA: Insufficient documentation

## 2018-04-02 DIAGNOSIS — E876 Hypokalemia: Secondary | ICD-10-CM

## 2018-04-02 DIAGNOSIS — F419 Anxiety disorder, unspecified: Secondary | ICD-10-CM | POA: Diagnosis not present

## 2018-04-02 DIAGNOSIS — Z1231 Encounter for screening mammogram for malignant neoplasm of breast: Secondary | ICD-10-CM | POA: Diagnosis not present

## 2018-04-02 DIAGNOSIS — M25511 Pain in right shoulder: Secondary | ICD-10-CM | POA: Insufficient documentation

## 2018-04-02 LAB — COMPREHENSIVE METABOLIC PANEL
ALK PHOS: 117 U/L (ref 39–117)
ALT: 6 U/L (ref 0–35)
AST: 13 U/L (ref 0–37)
Albumin: 3.7 g/dL (ref 3.5–5.2)
BUN: 7 mg/dL (ref 6–23)
CO2: 31 meq/L (ref 19–32)
Calcium: 9.2 mg/dL (ref 8.4–10.5)
Chloride: 100 mEq/L (ref 96–112)
Creatinine, Ser: 0.8 mg/dL (ref 0.40–1.20)
GFR: 92.02 mL/min (ref >60.00)
GLUCOSE: 92 mg/dL (ref 70–99)
Potassium: 3 mEq/L — ABNORMAL LOW (ref 3.5–5.1)
SODIUM: 140 meq/L (ref 135–145)
TOTAL PROTEIN: 7.7 g/dL (ref 6.0–8.3)
Total Bilirubin: 0.3 mg/dL (ref 0.2–1.2)

## 2018-04-02 LAB — SEDIMENTATION RATE: SED RATE: 60 mm/h — AB (ref 0–30)

## 2018-04-02 LAB — CK: CK TOTAL: 72 U/L (ref 7–177)

## 2018-04-02 IMAGING — MR MR LUMBAR SPINE W/O CM
5 series · 41 of 48 positions shown · non-contrast
Comparison: Radiographs dated 07/20/2016 and lumbar MRI dated
01/06/2016

CLINICAL DATA: Severe low back pain and left leg pain.

EXAM:
MRI LUMBAR SPINE WITHOUT CONTRAST
TECHNIQUE: Multiplanar, multisequence MR imaging of the lumbar spine was
performed. No intravenous contrast was administered.

[Series 2: T2 · sagittal · 4.0mm · 0.81mm/px · 7 of 19 slices shown (1 of 2)]
[im 1/19]
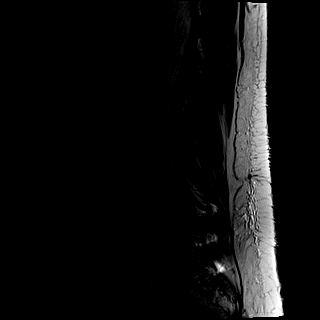
[im 4/19]
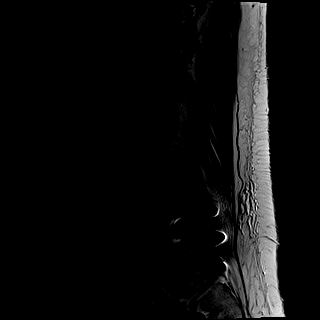
[im 7/19]
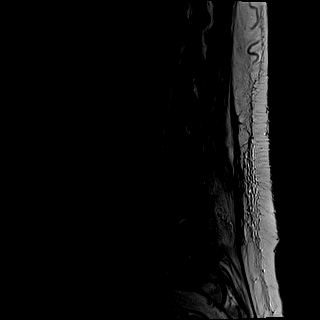
[im 10/19]
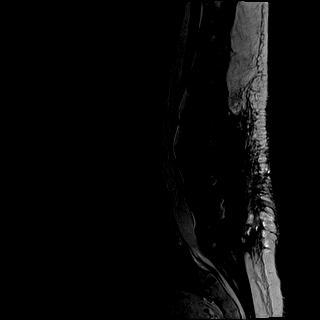
[im 13/19]
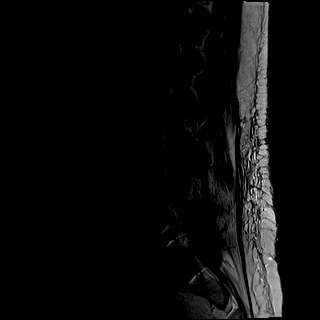
[im 16/19]
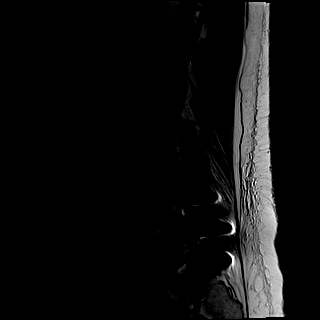
[im 19/19]
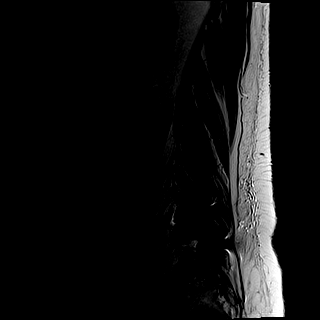

[Series 3: T1 · sagittal · 4.0mm · 1.02mm/px · 7 of 19 slices shown (1 of 2)]
[im 1/19]
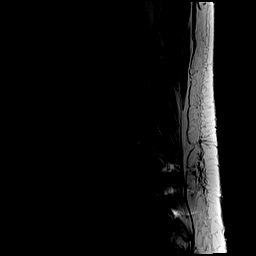
[im 4/19]
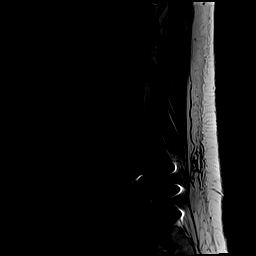
[im 7/19]
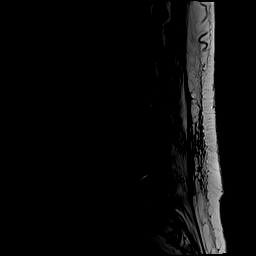
[im 10/19]
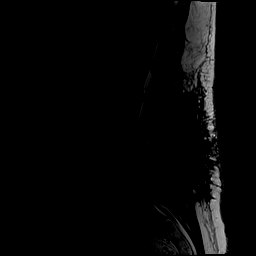
[im 13/19]
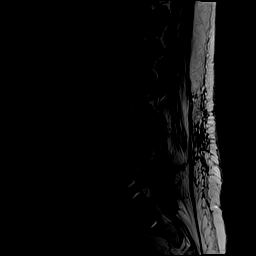
[im 16/19]
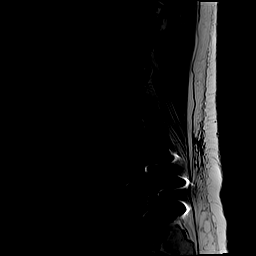
[im 19/19]
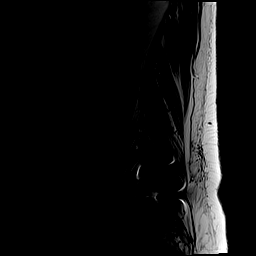

[Series 4: STIR · sagittal · 4.0mm · 1.02mm/px · 6 of 19 slices shown]
[im 1/19]
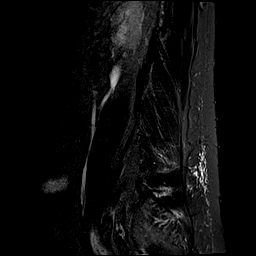
[im 4/19]
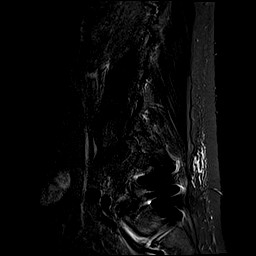
[im 8/19]
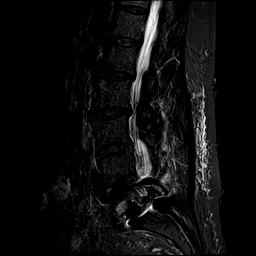
[im 11/19]
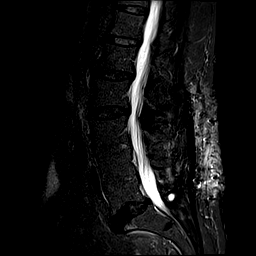
[im 15/19]
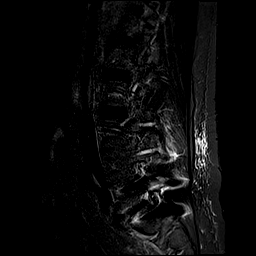
[im 19/19]
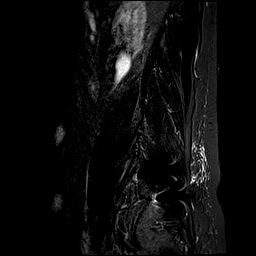

[Series 5: T2 · axial · 4.0mm · 0.78mm/px · z∈[-123,+104]mm · 13 of 42 slices shown (2 of 2)]
[im 1/42]
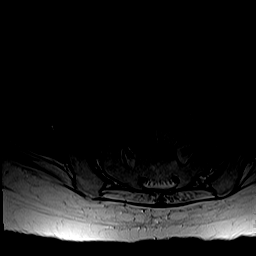
[im 4/42]
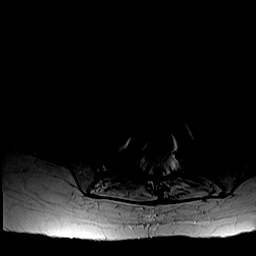
[im 7/42]
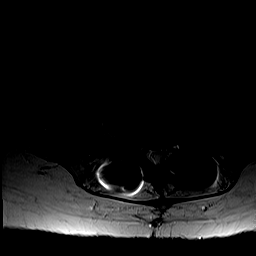
[im 10/42]
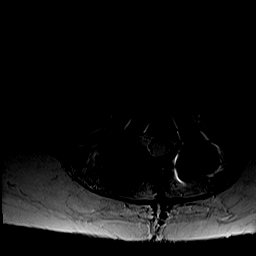
[im 13/42]
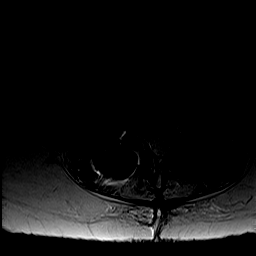
[im 16/42]
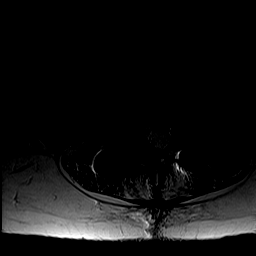
[im 19/42]
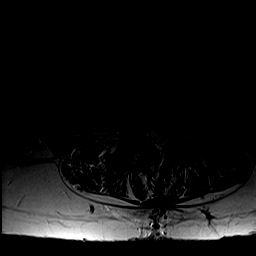
[im 23/42]
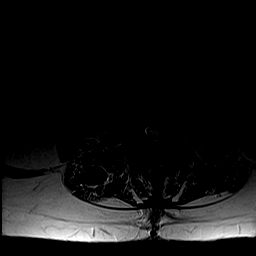
[im 26/42]
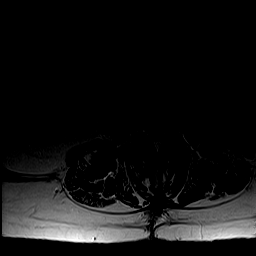
[im 29/42]
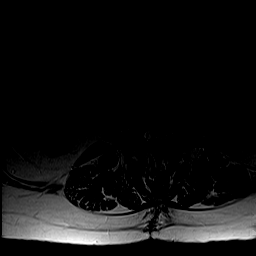
[im 32/42]
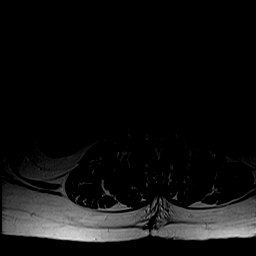
[im 35/42]
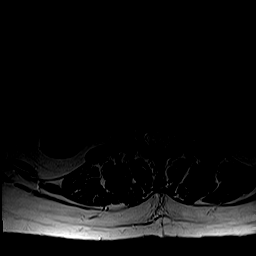
[im 42/42]
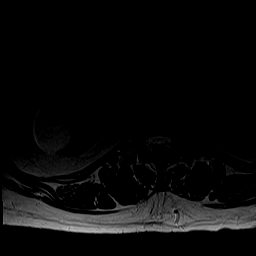

[Series 6: T1 · axial · 4.0mm · 0.39mm/px · z∈[-123,+104]mm · 8 of 42 slices shown (2 of 2)]
[im 1/42]
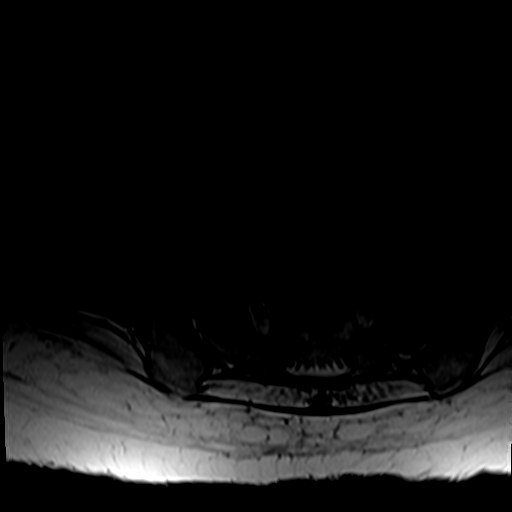
[im 7/42]
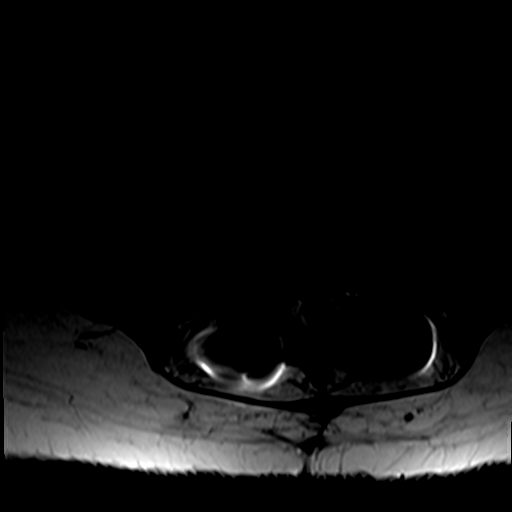
[im 13/42]
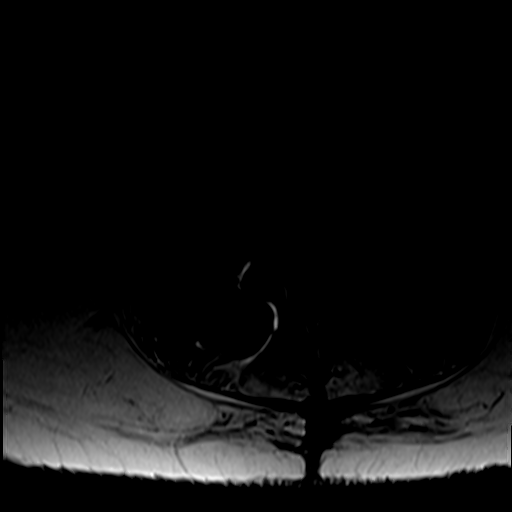
[im 19/42]
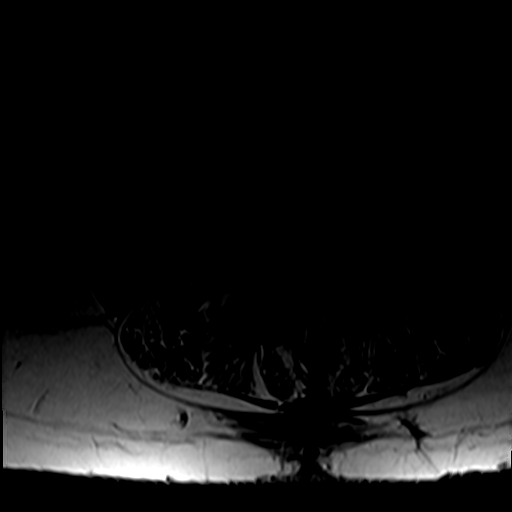
[im 23/42]
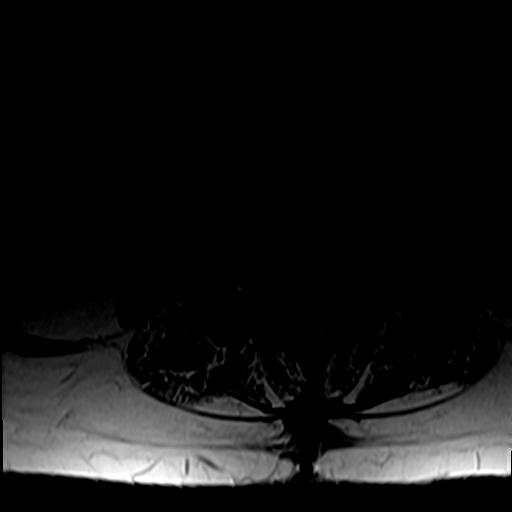
[im 29/42]
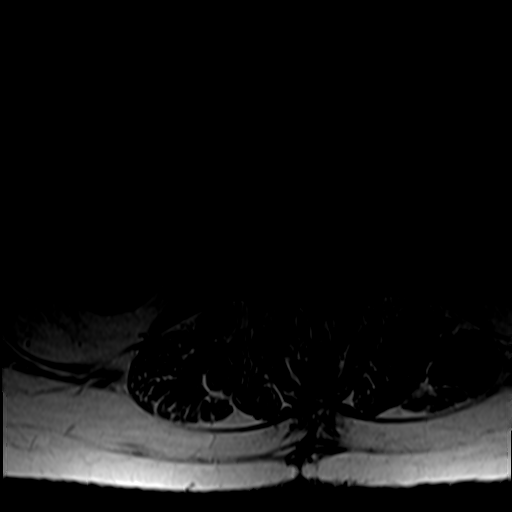
[im 35/42]
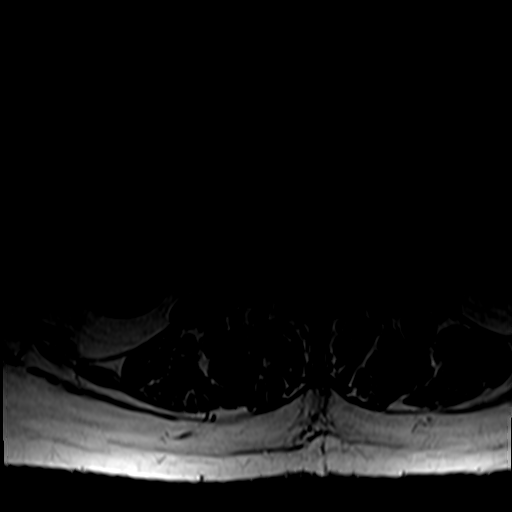
[im 42/42]
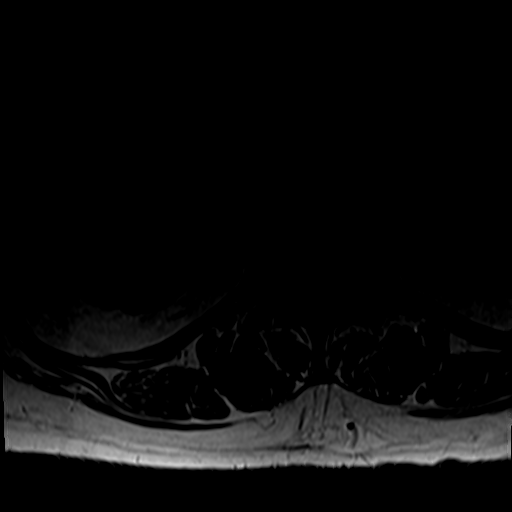

[41 of 48 positions shown; findings below may reference images not displayed]

FINDINGS: Segmentation:  Normal.

Alignment: Minimal chronic retrolisthesis of L2 on L3, 2.5 mm.
Otherwise normal.

Vertebrae: Solid interbody fusion at L3-4 and L4-5. Interbody fusion
device and pedicle screws at L5-S1. No fractures or bone
destruction.

Conus medullaris: Extends to the L1 level and appears normal.

Paraspinal and other soft tissues: Postsurgical changes in the
posterior soft tissues at the operative levels in the lumbar spine.
Benign cyst on the lateral aspect of the right kidney, stable.

Disc levels:

T12-L1:  Normal.

L1-2: Disc desiccation with a minimal retrolisthesis without neural
impingement. No disc bulging or protrusion. Minimal degenerative
changes the left facet joint.

L2-3: Disc desiccation with minimal retrolisthesis. Tiny disc bulge
into the left neural foramen with no neural impingement. Moderate
bilateral facet arthritis, slightly progressed.

L3-4: Pedicle screws have been removed.

Are L4-5: Solid interbody fusion. No residual neural impingement.
Widely patent neural foramina.

L5-S1: New interbody and posterior fusion. Ill-defined soft tissue
fills the left neural foramen best seen on image 35 of series 6 and
on images 14 of series [DATE] and 4. This could represent scar tissue
and less likely a disc fragment and could irritate the left L5
nerve. No visible solid fusion. Thecal sac is not compressed.
IMPRESSION: 1. Poorly defined soft tissue fills the left neural foramen at
L5-S1. This could represent postsurgical scarring or a disc
fragment. This could irritate the left L5 nerve.
2. Slight progression of bilateral facet arthritis at L2-3.

## 2018-04-02 MED ORDER — ESCITALOPRAM OXALATE 10 MG PO TABS: 10.0000 mg | ORAL_TABLET | Freq: Every day | ORAL | 1 refills | Status: DC

## 2018-04-02 MED ORDER — CYANOCOBALAMIN 1000 MCG/ML IJ SOLN
1000.0000 ug | Freq: Once | INTRAMUSCULAR | Status: AC
  Administered 2018-04-02: 1000 ug via INTRAMUSCULAR

## 2018-04-02 MED ORDER — POTASSIUM CHLORIDE CRYS ER 20 MEQ PO TBCR: 40.0000 meq | EXTENDED_RELEASE_TABLET | Freq: Every day | ORAL | 0 refills | Status: DC

## 2018-04-02 NOTE — Assessment & Plan Note (Signed)
This appears to have improved.  Workup was unremarkable.  Hematology recommended no additional evaluation unless she develops other symptoms.

## 2018-04-02 NOTE — Telephone Encounter (Signed)
Patient is in office today to be seen ( 04-02-18) she states that she does not have rheumatoid arthritis

## 2018-04-02 NOTE — Progress Notes (Signed)
Maria Rumps, MD Phone: [phone]  Maria Hamilton is a 67 y.o. female who presents today for f/u.  Notes for the last several weeks her upper arms and shoulders have been aching.  Hurts if she moves them in any direction.  She notes no injury.  She does note her whole body aches but though that has been chronic.  She notes no numbness or tingling.  No weakness.  Does note her neck hurts at times and she cracks it.  She notes no hip pain.  She is followed by pain management for restless legs and fibromyalgia.  She reports that she has difficulty sleeping and feels tired because of this.  She tosses and turns for about an hour or 2 and then wakes up 1 time in the middle the night.  She sleeps with a heating pad on her feet.  She does drink about 6 caffeinated beverages a day and drinks some late in to the day.  She does watch TV the hour before bed.  She feels depressed most of the time.  Also some anxiety when things are going correctly.  No SI.  She does still have hot flashes where she feels a central hotness and then warms up.  No sweats.  She is on Zoloft and notes this is not beneficial.  Vitamin B12 deficiency: Had been getting supplementation though missed last dose.  She will get this today.  Social History   Tobacco Use  Smoking Status Never Smoker  Smokeless Tobacco Never Used     ROS see history of present illness  Objective  Physical Exam Vitals:   04/02/18 0819  BP: 134/86  Pulse: 69  Temp: 98.3 F (36.8 C)  SpO2: 98%    BP Readings from Last 3 Encounters:  04/02/18 134/86  02/17/18 (!) 159/91  01/20/18 (!) 154/91   Wt Readings from Last 3 Encounters:  04/02/18 202 lb 9.6 oz (91.9 kg)  02/17/18 200 lb 3.2 oz (90.8 kg)  01/20/18 194 lb (88 kg)    Physical Exam  Constitutional: No distress.  Cardiovascular: Normal rate, regular rhythm and normal heart sounds.  Pulmonary/Chest: Effort normal and breath sounds normal.  Musculoskeletal: She exhibits no  edema.  Bilateral shoulders and upper arms slightly tender to palpation, she has full range of motion bilateral shoulders on active and passive range of motion though there is discomfort through all ranges of motion, shoulders are symmetric, positive empty can bilaterally, no midline spine tenderness, no midline spine step-off, no muscular neck tenderness, there is paraspinous muscular back tenderness in the thoracic spine, 5/5 strength in bilateral biceps, triceps, grip, quads, hamstrings, plantar and dorsiflexion, sensation to light touch intact in bilateral UE and LE, normal gait  Neurological: She is alert.  Skin: Skin is warm and dry. She is not diaphoretic.     Assessment/Plan: Please see individual problem list.  Vitamin B 12 deficiency B12 injection given today.  She will return monthly for these.  Pain of both shoulder joints Patient with a fairly mixed picture for cause of her symptoms.  She has muscle aches as well as joint aches.  This could be a primary muscle issue or a joint issue.  She could have rotator cuff tendinitis.  She could have a neck issue.  We will start with lab work to evaluate the myalgias and arthralgias.  She reports no history of rheumatoid arthritis.  Once the lab work comes back we will either refer to a rheumatologist or sports medicine.  Anxiety and depression Patient notes symptoms of anxiety and depression.  That could be contributing to her sleep issues.  We will switch her from Zoloft to Lexapro. Initially sent to mail order, though that will be canceled and sent to local pharmacy.  Discussed with her husband that she can discontinue the Zoloft 1 day and then the next day start the Lexapro.  Night sweats This appears to have improved.  Workup was unremarkable.  Hematology recommended no additional evaluation unless she develops other symptoms.  Hypokalemia She reports she did not see nephrology and does not want to see them.  Advised that we do not have  a reason for her hypokalemia though if she does not want to be evaluated we could potentially put her on long-term potassium supplements.  Insomnia Continues to have issues with this.  I think this is multifactorial in cause.  Caffeinated beverage intake is certainly contributing.  She will decrease that and try to limit intake to prior to 3 PM.  We will treat her anxiety and depression.  We will workup her pain.  She can try melatonin over-the-counter.   Health Maintenance: We will have the CMA contact the patient to discuss obtaining a mammogram.  It also appears she was supposed to have had ultrasound of her thyroid that never got done.  I have the [facility] contact her with her as well.  Orders Placed This Encounter  Procedures  . Antinuclear Antib (ANA)  . Rheumatoid Factor  . Cyclic citrul peptide antibody, IgG  . Sedimentation rate  . CK (Creatine Kinase)  . Comp Met (CMET)    Meds ordered this encounter  Medications  . DISCONTD: escitalopram (LEXAPRO) 10 MG tablet    Sig: Take 1 tablet (10 mg total) by mouth daily.    Dispense:  90 tablet    Refill:  1  . cyanocobalamin ((VITAMIN B-12)) injection 1,000 mcg  . escitalopram (LEXAPRO) 10 MG tablet    Sig: Take 1 tablet (10 mg total) by mouth daily.    Dispense:  90 tablet    Refill:  1     Maria Rumps, MD [place]

## 2018-04-02 NOTE — Assessment & Plan Note (Signed)
She reports she did not see nephrology and does not want to see them.  Advised that we do not have a reason for her hypokalemia though if she does not want to be evaluated we could potentially put her on long-term potassium supplements.

## 2018-04-02 NOTE — Assessment & Plan Note (Signed)
Continues to have issues with this.  I think this is multifactorial in cause.  Caffeinated beverage intake is certainly contributing.  She will decrease that and try to limit intake to prior to 3 PM.  We will treat her anxiety and depression.  We will workup her pain.  She can try melatonin over-the-counter.

## 2018-04-02 NOTE — Assessment & Plan Note (Signed)
B12 injection given today.  She will return monthly for these.

## 2018-04-02 NOTE — Patient Instructions (Signed)
Nice to see you. You may have several issues going on with your shoulders and arms.  We will check lab work today and then determine the next step in management. You need to decrease your caffeinated beverage intake and limit it to prior to 3 PM if possible.  You could also try melatonin at night for sleep. We will start you on Lexapro to help with depression and see if that helps with your hot flashes as well. If you develop thoughts of harming yourself please go to the emergency room.

## 2018-04-02 NOTE — Assessment & Plan Note (Signed)
Patient with a fairly mixed picture for cause of her symptoms.  She has muscle aches as well as joint aches.  This could be a primary muscle issue or a joint issue.  She could have rotator cuff tendinitis.  She could have a neck issue.  We will start with lab work to evaluate the myalgias and arthralgias.  She reports no history of rheumatoid arthritis.  Once the lab work comes back we will either refer to a rheumatologist or sports medicine.

## 2018-04-02 NOTE — Assessment & Plan Note (Signed)
Patient notes symptoms of anxiety and depression.  That could be contributing to her sleep issues.  We will switch her from Zoloft to Lexapro. Initially sent to mail order, though that will be canceled and sent to local pharmacy.  Discussed with her husband that she can discontinue the Zoloft 1 day and then the next day start the Lexapro.

## 2018-04-04 LAB — ANA: ANA: NEGATIVE

## 2018-04-04 LAB — CYCLIC CITRUL PEPTIDE ANTIBODY, IGG: Cyclic Citrullin Peptide Ab: <16 UNITS

## 2018-04-04 LAB — RHEUMATOID FACTOR: Rhuematoid fact SerPl-aCnc: <14 IU/mL (ref <14)

## 2018-04-05 ENCOUNTER — Other Ambulatory Visit: Payer: Self-pay | Admitting: Family Medicine

## 2018-04-05 DIAGNOSIS — E876 Hypokalemia: Secondary | ICD-10-CM

## 2018-04-05 DIAGNOSIS — M791 Myalgia, unspecified site: Secondary | ICD-10-CM

## 2018-04-05 MED ORDER — PREDNISONE 20 MG PO TABS: 20.0000 mg | ORAL_TABLET | Freq: Every day | ORAL | 0 refills | Status: DC

## 2018-04-09 ENCOUNTER — Other Ambulatory Visit: Payer: Self-pay | Admitting: Family Medicine

## 2018-04-09 ENCOUNTER — Other Ambulatory Visit (INDEPENDENT_AMBULATORY_CARE_PROVIDER_SITE_OTHER): Payer: Medicare HMO

## 2018-04-09 DIAGNOSIS — E876 Hypokalemia: Secondary | ICD-10-CM | POA: Diagnosis not present

## 2018-04-09 LAB — POTASSIUM: Potassium: 2.9 mEq/L — ABNORMAL LOW (ref 3.5–5.1)

## 2018-04-09 MED ORDER — POTASSIUM CHLORIDE CRYS ER 20 MEQ PO TBCR: 20.0000 meq | EXTENDED_RELEASE_TABLET | Freq: Every day | ORAL | 0 refills | Status: DC

## 2018-04-16 ENCOUNTER — Other Ambulatory Visit (INDEPENDENT_AMBULATORY_CARE_PROVIDER_SITE_OTHER): Payer: Medicare HMO

## 2018-04-16 DIAGNOSIS — E876 Hypokalemia: Secondary | ICD-10-CM

## 2018-04-16 LAB — POTASSIUM: Potassium: 3.1 mEq/L — ABNORMAL LOW (ref 3.5–5.1)

## 2018-04-16 NOTE — Progress Notes (Signed)
Scheduled and mailed appointment reminder

## 2018-04-16 NOTE — Progress Notes (Signed)
Patient states that she would like to get a mammogram and thyroid ultra sound scheduled but did not specify day or times.

## 2018-04-16 NOTE — Addendum Note (Signed)
Addended by: Leone Haven on: 04/16/2018 09:59 AM   Modules accepted: Orders

## 2018-04-17 ENCOUNTER — Telehealth: Payer: Self-pay

## 2018-04-17 DIAGNOSIS — E876 Hypokalemia: Secondary | ICD-10-CM

## 2018-04-17 NOTE — Telephone Encounter (Signed)
-----   Message from Leone Haven, MD sent at 04/16/2018  6:00 PM EDT ----- Please of the patient know that her potassium is improved though still mildly low.  She should  increase her daily dosing of potassium supplementation to 40 mEq once daily for 3 days and then go back to 20 mEq once daily for 3 days.  We should recheck in 1 to 2 weeks.  Please place an order for potassium with a diagnosis of hypokalemia.  Thanks.

## 2018-04-22 ENCOUNTER — Ambulatory Visit: Payer: Medicare HMO

## 2018-04-28 ENCOUNTER — Other Ambulatory Visit: Payer: Self-pay

## 2018-04-28 ENCOUNTER — Ambulatory Visit: Payer: Medicare HMO | Attending: Pain Medicine | Admitting: Pain Medicine

## 2018-04-28 ENCOUNTER — Encounter: Payer: Self-pay | Admitting: Pain Medicine

## 2018-04-28 VITALS — BP 160/90 | HR 72 | Temp 98.4°F | Resp 18 | Ht 73.0 in | Wt 203.0 lb

## 2018-04-28 DIAGNOSIS — Z79899 Other long term (current) drug therapy: Secondary | ICD-10-CM | POA: Insufficient documentation

## 2018-04-28 DIAGNOSIS — M1610 Unilateral primary osteoarthritis, unspecified hip: Secondary | ICD-10-CM | POA: Insufficient documentation

## 2018-04-28 DIAGNOSIS — K5900 Constipation, unspecified: Secondary | ICD-10-CM | POA: Diagnosis not present

## 2018-04-28 DIAGNOSIS — I10 Essential (primary) hypertension: Secondary | ICD-10-CM | POA: Diagnosis not present

## 2018-04-28 DIAGNOSIS — E538 Deficiency of other specified B group vitamins: Secondary | ICD-10-CM | POA: Insufficient documentation

## 2018-04-28 DIAGNOSIS — M47817 Spondylosis without myelopathy or radiculopathy, lumbosacral region: Secondary | ICD-10-CM | POA: Diagnosis not present

## 2018-04-28 DIAGNOSIS — M5116 Intervertebral disc disorders with radiculopathy, lumbar region: Secondary | ICD-10-CM | POA: Diagnosis not present

## 2018-04-28 DIAGNOSIS — F329 Major depressive disorder, single episode, unspecified: Secondary | ICD-10-CM | POA: Insufficient documentation

## 2018-04-28 DIAGNOSIS — M47819 Spondylosis without myelopathy or radiculopathy, site unspecified: Secondary | ICD-10-CM | POA: Diagnosis not present

## 2018-04-28 DIAGNOSIS — M797 Fibromyalgia: Secondary | ICD-10-CM | POA: Insufficient documentation

## 2018-04-28 DIAGNOSIS — G2581 Restless legs syndrome: Secondary | ICD-10-CM | POA: Insufficient documentation

## 2018-04-28 DIAGNOSIS — G894 Chronic pain syndrome: Secondary | ICD-10-CM | POA: Diagnosis present

## 2018-04-28 DIAGNOSIS — M25552 Pain in left hip: Secondary | ICD-10-CM

## 2018-04-28 DIAGNOSIS — F419 Anxiety disorder, unspecified: Secondary | ICD-10-CM | POA: Insufficient documentation

## 2018-04-28 DIAGNOSIS — Z811 Family history of alcohol abuse and dependence: Secondary | ICD-10-CM | POA: Insufficient documentation

## 2018-04-28 DIAGNOSIS — E559 Vitamin D deficiency, unspecified: Secondary | ICD-10-CM | POA: Diagnosis not present

## 2018-04-28 DIAGNOSIS — Z888 Allergy status to other drugs, medicaments and biological substances status: Secondary | ICD-10-CM | POA: Diagnosis not present

## 2018-04-28 DIAGNOSIS — Z90710 Acquired absence of both cervix and uterus: Secondary | ICD-10-CM | POA: Insufficient documentation

## 2018-04-28 DIAGNOSIS — G4700 Insomnia, unspecified: Secondary | ICD-10-CM | POA: Insufficient documentation

## 2018-04-28 DIAGNOSIS — M533 Sacrococcygeal disorders, not elsewhere classified: Secondary | ICD-10-CM

## 2018-04-28 DIAGNOSIS — R42 Dizziness and giddiness: Secondary | ICD-10-CM | POA: Diagnosis not present

## 2018-04-28 DIAGNOSIS — K859 Acute pancreatitis without necrosis or infection, unspecified: Secondary | ICD-10-CM | POA: Diagnosis not present

## 2018-04-28 DIAGNOSIS — M069 Rheumatoid arthritis, unspecified: Secondary | ICD-10-CM | POA: Diagnosis not present

## 2018-04-28 DIAGNOSIS — G8929 Other chronic pain: Secondary | ICD-10-CM | POA: Diagnosis not present

## 2018-04-28 DIAGNOSIS — B0229 Other postherpetic nervous system involvement: Secondary | ICD-10-CM | POA: Insufficient documentation

## 2018-04-28 DIAGNOSIS — Z9889 Other specified postprocedural states: Secondary | ICD-10-CM | POA: Insufficient documentation

## 2018-04-28 DIAGNOSIS — M48061 Spinal stenosis, lumbar region without neurogenic claudication: Secondary | ICD-10-CM | POA: Diagnosis not present

## 2018-04-28 DIAGNOSIS — M47816 Spondylosis without myelopathy or radiculopathy, lumbar region: Secondary | ICD-10-CM | POA: Diagnosis not present

## 2018-04-28 DIAGNOSIS — M5442 Lumbago with sciatica, left side: Secondary | ICD-10-CM | POA: Diagnosis not present

## 2018-04-28 DIAGNOSIS — Z9049 Acquired absence of other specified parts of digestive tract: Secondary | ICD-10-CM | POA: Insufficient documentation

## 2018-04-28 DIAGNOSIS — Z96643 Presence of artificial hip joint, bilateral: Secondary | ICD-10-CM | POA: Insufficient documentation

## 2018-04-28 DIAGNOSIS — E876 Hypokalemia: Secondary | ICD-10-CM | POA: Insufficient documentation

## 2018-04-28 DIAGNOSIS — K219 Gastro-esophageal reflux disease without esophagitis: Secondary | ICD-10-CM | POA: Insufficient documentation

## 2018-04-28 DIAGNOSIS — Z96642 Presence of left artificial hip joint: Secondary | ICD-10-CM

## 2018-04-28 DIAGNOSIS — Z881 Allergy status to other antibiotic agents status: Secondary | ICD-10-CM | POA: Insufficient documentation

## 2018-04-28 DIAGNOSIS — Z885 Allergy status to narcotic agent status: Secondary | ICD-10-CM | POA: Insufficient documentation

## 2018-04-28 DIAGNOSIS — Z809 Family history of malignant neoplasm, unspecified: Secondary | ICD-10-CM | POA: Insufficient documentation

## 2018-04-28 DIAGNOSIS — R002 Palpitations: Secondary | ICD-10-CM | POA: Diagnosis not present

## 2018-04-28 DIAGNOSIS — Z96641 Presence of right artificial hip joint: Secondary | ICD-10-CM

## 2018-04-28 DIAGNOSIS — G629 Polyneuropathy, unspecified: Secondary | ICD-10-CM | POA: Insufficient documentation

## 2018-04-28 DIAGNOSIS — M25551 Pain in right hip: Secondary | ICD-10-CM

## 2018-04-28 DIAGNOSIS — Z8249 Family history of ischemic heart disease and other diseases of the circulatory system: Secondary | ICD-10-CM | POA: Insufficient documentation

## 2018-04-28 NOTE — Patient Instructions (Addendum)
____________________________________________________________________________________________  Pain Scale  Introduction: The pain score used by this practice is the Verbal Numerical Rating Scale (VNRS-11). This is an 11-point scale. It is for adults and children 10 years or older. There are significant differences in how the pain score is reported, used, and applied. Forget everything you learned in the past and learn this scoring system.  General Information: The scale should reflect your current level of pain. Unless you are specifically asked for the level of your worst pain, or your average pain. If you are asked for one of these two, then it should be understood that it is over the past 24 hours.  Basic Activities of Daily Living (ADL): Personal hygiene, dressing, eating, transferring, and using restroom.  Instructions: Most patients tend to report their level of pain as a combination of two factors, their physical pain and their psychosocial pain. This last one is also known as "suffering" and it is reflection of how physical pain affects you socially and psychologically. From now on, report them separately. From this point on, when asked to report your pain level, report only your physical pain. Use the following table for reference.  Pain Clinic Pain Levels (0-5/10)  Pain Level Score  Description  No Pain 0   Mild pain 1 Nagging, annoying, but does not interfere with basic activities of daily living (ADL). Patients are able to eat, bathe, get dressed, toileting (being able to get on and off the toilet and perform personal hygiene functions), transfer (move in and out of bed or a chair without assistance), and maintain continence (able to control bladder and bowel functions). Blood pressure and heart rate are unaffected. A normal heart rate for a healthy adult ranges from 60 to 100 bpm (beats per minute).   Mild to moderate pain 2 Noticeable and distracting. Impossible to hide from other  people. More frequent flare-ups. Still possible to adapt and function close to normal. It can be very annoying and may have occasional stronger flare-ups. With discipline, patients may get used to it and adapt.   Moderate pain 3 Interferes significantly with activities of daily living (ADL). It becomes difficult to feed, bathe, get dressed, get on and off the toilet or to perform personal hygiene functions. Difficult to get in and out of bed or a chair without assistance. Very distracting. With effort, it can be ignored when deeply involved in activities.   Moderately severe pain 4 Impossible to ignore for more than a few minutes. With effort, patients may still be able to manage work or participate in some social activities. Very difficult to concentrate. Signs of autonomic nervous system discharge are evident: dilated pupils (mydriasis); mild sweating (diaphoresis); sleep interference. Heart rate becomes elevated (>115 bpm). Diastolic blood pressure (lower number) rises above 100 mmHg. Patients find relief in laying down and not moving.   Severe pain 5 Intense and extremely unpleasant. Associated with frowning face and frequent crying. Pain overwhelms the senses.  Ability to do any activity or maintain social relationships becomes significantly limited. Conversation becomes difficult. Pacing back and forth is common, as getting into a comfortable position is nearly impossible. Pain wakes you up from deep sleep. Physical signs will be obvious: pupillary dilation; increased sweating; goosebumps; brisk reflexes; cold, clammy hands and feet; nausea, vomiting or dry heaves; loss of appetite; significant sleep disturbance with inability to fall asleep or to remain asleep. When persistent, significant weight loss is observed due to the complete loss of appetite and sleep deprivation.  Blood   pressure and heart rate becomes significantly elevated. Caution: If elevated blood pressure triggers a pounding headache  associated with blurred vision, then the patient should immediately seek attention at an urgent or emergency care unit, as these may be signs of an impending stroke.    Emergency Department Pain Levels (6-10/10)  Emergency Room Pain 6 Severely limiting. Requires emergency care and should not be seen or managed at an outpatient pain management facility. Communication becomes difficult and requires great effort. Assistance to reach the emergency department may be required. Facial flushing and profuse sweating along with potentially dangerous increases in heart rate and blood pressure will be evident.   Distressing pain 7 Self-care is very difficult. Assistance is required to transport, or use restroom. Assistance to reach the emergency department will be required. Tasks requiring coordination, such as bathing and getting dressed become very difficult.   Disabling pain 8 Self-care is no longer possible. At this level, pain is disabling. The individual is unable to do even the most "basic" activities such as walking, eating, bathing, dressing, transferring to a bed, or toileting. Fine motor skills are lost. It is difficult to think clearly.   Incapacitating pain 9 Pain becomes incapacitating. Thought processing is no longer possible. Difficult to remember your own name. Control of movement and coordination are lost.   The worst pain imaginable 10 At this level, most patients pass out from pain. When this level is reached, collapse of the autonomic nervous system occurs, leading to a sudden drop in blood pressure and heart rate. This in turn results in a temporary and dramatic drop in blood flow to the brain, leading to a loss of consciousness. Fainting is one of the body's self defense mechanisms. Passing out puts the brain in a calmed state and causes it to shut down for a while, in order to begin the healing process.    Summary: 1. Refer to this scale when providing us with your pain level. 2. Be  accurate and careful when reporting your pain level. This will help with your care. 3. Over-reporting your pain level will lead to loss of credibility. 4. Even a level of 1/10 means that there is pain and will be treated at our facility. 5. High, inaccurate reporting will be documented as "Symptom Exaggeration", leading to loss of credibility and suspicions of possible secondary gains such as obtaining more narcotics, or wanting to appear disabled, for fraudulent reasons. 6. Only pain levels of 5 or below will be seen at our facility. 7. Pain levels of 6 and above will be sent to the Emergency Department and the appointment cancelled. ____________________________________________________________________________________________   ____________________________________________________________________________________________  Preparing for Procedure with Sedation  Instructions: . Oral Intake: Do not eat or drink anything for at least 8 hours prior to your procedure. . Transportation: Public transportation is not allowed. Bring an adult driver. The driver must be physically present in our waiting room before any procedure can be started. . Physical Assistance: Bring an adult physically capable of assisting you, in the event you need help. This adult should keep you company at home for at least 6 hours after the procedure. . Blood Pressure Medicine: Take your blood pressure medicine with a sip of water the morning of the procedure. . Blood thinners:  . Diabetics on insulin: Notify the staff so that you can be scheduled 1st case in the morning. If your diabetes requires high dose insulin, take only  of your normal insulin dose the morning of the procedure and   notify the staff that you have done so. . Preventing infections: Shower with an antibacterial soap the morning of your procedure. . Build-up your immune system: Take 1000 mg of Vitamin C with every meal (3 times a day) the day prior to your  procedure. Marland Kitchen Antibiotics: Inform the staff if you have a condition or reason that requires you to take antibiotics before dental procedures. . Pregnancy: If you are pregnant, call and cancel the procedure. . Sickness: If you have a cold, fever, or any active infections, call and cancel the procedure. . Arrival: You must be in the facility at least 30 minutes prior to your scheduled procedure. . Children: Do not bring children with you. . Dress appropriately: Bring dark clothing that you would not mind if they get stained. . Valuables: Do not bring any jewelry or valuables.  Procedure appointments are reserved for interventional treatments only. Marland Kitchen No Prescription Refills. . No medication changes will be discussed during procedure appointments. . No disability issues will be discussed.  Remember:  Regular Business hours are:  Monday to Thursday 8:00 AM to 4:00 PM  Provider's Schedule: Milinda Pointer, MD:  Procedure days: Tuesday and Thursday 7:30 AM to 4:00 PM  Gillis Santa, MD:  Procedure days: Monday and Wednesday 7:30 AM to 4:00 PM ____________________________________________________________________________________________   Facet Blocks Patient Information  Description: The facets are joints in the spine between the vertebrae.  Like any joints in the body, facets can become irritated and painful.  Arthritis can also effect the facets.  By injecting steroids and local anesthetic in and around these joints, we can temporarily block the nerve supply to them.  Steroids act directly on irritated nerves and tissues to reduce selling and inflammation which often leads to decreased pain.  Facet blocks may be done anywhere along the spine from the neck to the low back depending upon the location of your pain.   After numbing the skin with local anesthetic (like Novocaine), a small needle is passed onto the facet joints under x-ray guidance.  You may experience a sensation of pressure while  this is being done.  The entire block usually lasts about 15-25 minutes.   Conditions which may be treated by facet blocks:   Low back/buttock pain  Neck/shoulder pain  Certain types of headaches  Preparation for the injection:  1. Do not eat any solid food or dairy products within 8 hours of your appointment. 2. You may drink clear liquid up to 3 hours before appointment.  Clear liquids include water, black coffee, juice or soda.  No milk or cream please. 3. You may take your regular medication, including pain medications, with a sip of water before your appointment.  Diabetics should hold regular insulin (if taken separately) and take 1/2 normal NPH dose the morning of the procedure.  Carry some sugar containing items with you to your appointment. 4. A driver must accompany you and be prepared to drive you home after your procedure. 5. Bring all your current medications with you. 6. An IV may be inserted and sedation may be given at the discretion of the physician. 7. A blood pressure cuff, EKG and other monitors will often be applied during the procedure.  Some patients may need to have extra oxygen administered for a short period. 8. You will be asked to provide medical information, including your allergies and medications, prior to the procedure.  We must know immediately if you are taking blood thinners (like Coumadin/Warfarin) or if you are  allergic to IV iodine contrast (dye).  We must know if you could possible be pregnant.  Possible side-effects:   Bleeding from needle site  Infection (rare, may require surgery)  Nerve injury (rare)  Numbness & tingling (temporary)  Difficulty urinating (rare, temporary)  Spinal headache (a headache worse with upright posture)  Light-headedness (temporary)  Pain at injection site (serveral days)  Decreased blood pressure (rare, temporary)  Weakness in arm/leg (temporary)  Pressure sensation in back/neck (temporary)   Call if  you experience:   Fever/chills associated with headache or increased back/neck pain  Headache worsened by an upright position  New onset, weakness or numbness of an extremity below the injection site  Hives or difficulty breathing (go to the emergency room)  Inflammation or drainage at the injection site(s)  Severe back/neck pain greater than usual  New symptoms which are concerning to you  Please note:  Although the local anesthetic injected can often make your back or neck feel good for several hours after the injection, the pain will likely return. It takes 3-7 days for steroids to work.  You may not notice any pain relief for at least one week.  If effective, we will often do a series of 2-3 injections spaced 3-6 weeks apart to maximally decrease your pain.  After the initial series, you may be a candidate for a more permanent nerve block of the facets.  If you have any questions, please call #[phone] [place]  What are the risk, side effects and possible complications? Generally speaking, most procedures are safe.  However, with any procedure there are risks, side effects, and the possibility of complications.  The risks and complications are dependent upon the sites that are lesioned, or the type of nerve block to be performed.  The closer the procedure is to the spine, the more serious the risks are.  Great care is taken when placing the radio frequency needles, block needles or lesioning probes, but sometimes complications can occur. 1. Infection: Any time there is an injection through the skin, there is a risk of infection.  This is why sterile conditions are used for these blocks.  There are four possible types of infection. 1. Localized skin infection. 2. Central Nervous System Infection-This can be in the form of Meningitis, which can be deadly. 3. Epidural Infections-This can be in the form of an  epidural abscess, which can cause pressure inside of the spine, causing compression of the spinal cord with subsequent paralysis. This would require an emergency surgery to decompress, and there are no guarantees that the patient would recover from the paralysis. 4. Discitis-This is an infection of the intervertebral discs.  It occurs in about 1% of discography procedures.  It is difficult to treat and it may lead to surgery.        2. Pain: the needles have to go through skin and soft tissues, will cause soreness.       3. Damage to internal structures:  The nerves to be lesioned may be near blood vessels or    other nerves which can be potentially damaged.       4. Bleeding: Bleeding is more common if the patient is taking blood thinners such as  aspirin, Coumadin, Ticiid, Plavix, etc., or if he/she have some genetic predisposition  such as hemophilia. Bleeding into the spinal canal can cause compression of the spinal  cord with subsequent paralysis.  This would require an  emergency surgery to  decompress and there are no guarantees that the patient would recover from the  paralysis.       5. Pneumothorax:  Puncturing of a lung is a possibility, every time a needle is introduced in  the area of the chest or upper back.  Pneumothorax refers to free air around the  collapsed lung(s), inside of the thoracic cavity (chest cavity).  Another two possible  complications related to a similar event would include: Hemothorax and Chylothorax.   These are variations of the Pneumothorax, where instead of air around the collapsed  lung(s), you may have blood or chyle, respectively.       6. Spinal headaches: They may occur with any procedures in the area of the spine.       7. Persistent CSF (Cerebro-Spinal Fluid) leakage: This is a rare problem, but may occur  with prolonged intrathecal or epidural catheters either due to the formation of a fistulous  track or a dural tear.       8. Nerve damage: By working so close  to the spinal cord, there is always a possibility of  nerve damage, which could be as serious as a permanent spinal cord injury with  paralysis.       9. Death:  Although rare, severe deadly allergic reactions known as "Anaphylactic  reaction" can occur to any of the medications used.      10. Worsening of the symptoms:  We can always make thing worse.  What are the chances of something like this happening? Chances of any of this occuring are extremely low.  By statistics, you have more of a chance of getting killed in a motor vehicle accident: while driving to the hospital than any of the above occurring .  Nevertheless, you should be aware that they are possibilities.  In general, it is similar to taking a shower.  Everybody knows that you can slip, hit your head and get killed.  Does that mean that you should not shower again?  Nevertheless always keep in mind that statistics do not mean anything if you happen to be on the wrong side of them.  Even if a procedure has a 1 (one) in a 1,000,000 (million) chance of going wrong, it you happen to be that one..Also, keep in mind that by statistics, you have more of a chance of having something go wrong when taking medications.  Who should not have this procedure? If you are on a blood thinning medication (e.g. Coumadin, Plavix, see list of "Blood Thinners"), or if you have an active infection going on, you should not have the procedure.  If you are taking any blood thinners, please inform your physician.  How should I prepare for this procedure?  Do not eat or drink anything at least six hours prior to the procedure.  Bring a driver with you .  It cannot be a taxi.  Come accompanied by an adult that can drive you back, and that is strong enough to help you if your legs get weak or numb from the local anesthetic.  Take all of your medicines the morning of the procedure with just enough water to swallow them.  If you have diabetes, make sure that you  are scheduled to have your procedure done first thing in the morning, whenever possible.  If you have diabetes, take only half of your insulin dose and notify our nurse that you have done so as soon as you arrive at  the clinic.  If you are diabetic, but only take blood sugar pills (oral hypoglycemic), then do not take them on the morning of your procedure.  You may take them after you have had the procedure.  Do not take aspirin or any aspirin-containing medications, at least eleven (11) days prior to the procedure.  They may prolong bleeding.  Wear loose fitting clothing that may be easy to take off and that you would not mind if it got stained with Betadine or blood.  Do not wear any jewelry or perfume  Remove any nail coloring.  It will interfere with some of our monitoring equipment.  NOTE: Remember that this is not meant to be interpreted as a complete list of all possible complications.  Unforeseen problems may occur.  BLOOD THINNERS The following drugs contain aspirin or other products, which can cause increased bleeding during surgery and should not be taken for 2 weeks prior to and 1 week after surgery.  If you should need take something for relief of minor pain, you may take acetaminophen which is found in Tylenol,m Datril, Anacin-3 and Panadol. It is not blood thinner. The products listed below are.  Do not take any of the products listed below in addition to any listed on your instruction sheet.  A.P.C or A.P.C with Codeine Codeine Phosphate Capsules #3 Ibuprofen Ridaura  ABC compound Congesprin Imuran rimadil  Advil Cope Indocin Robaxisal  Alka-Seltzer Effervescent Pain Reliever and Antacid Coricidin or Coricidin-D  Indomethacin Rufen  Alka-Seltzer plus Cold Medicine Cosprin Ketoprofen S-A-C Tablets  Anacin Analgesic Tablets or Capsules Coumadin Korlgesic Salflex  Anacin Extra Strength Analgesic tablets or capsules CP-2 Tablets Lanoril Salicylate  Anaprox Cuprimine Capsules  Levenox Salocol  Anexsia-D Dalteparin Magan Salsalate  Anodynos Darvon compound Magnesium Salicylate Sine-off  Ansaid Dasin Capsules Magsal Sodium Salicylate  Anturane Depen Capsules Marnal Soma  APF Arthritis pain formula Dewitt's Pills Measurin Stanback  Argesic Dia-Gesic Meclofenamic Sulfinpyrazone  Arthritis Bayer Timed Release Aspirin Diclofenac Meclomen Sulindac  Arthritis pain formula Anacin Dicumarol Medipren Supac  Analgesic (Safety coated) Arthralgen Diffunasal Mefanamic Suprofen  Arthritis Strength Bufferin Dihydrocodeine Mepro Compound Suprol  Arthropan liquid Dopirydamole Methcarbomol with Aspirin Synalgos  ASA tablets/Enseals Disalcid Micrainin Tagament  Ascriptin Doan's Midol Talwin  Ascriptin A/D Dolene Mobidin Tanderil  Ascriptin Extra Strength Dolobid Moblgesic Ticlid  Ascriptin with Codeine Doloprin or Doloprin with Codeine Momentum Tolectin  Asperbuf Duoprin Mono-gesic Trendar  Aspergum Duradyne Motrin or Motrin IB Triminicin  Aspirin plain, buffered or enteric coated Durasal Myochrisine Trigesic  Aspirin Suppositories Easprin Nalfon Trillsate  Aspirin with Codeine Ecotrin Regular or Extra Strength Naprosyn Uracel  Atromid-S Efficin Naproxen Ursinus  Auranofin Capsules Elmiron Neocylate Vanquish  Axotal Emagrin Norgesic Verin  Azathioprine Empirin or Empirin with Codeine Normiflo Vitamin E  Azolid Emprazil Nuprin Voltaren  Bayer Aspirin plain, buffered or children's or timed BC Tablets or powders Encaprin Orgaran Warfarin Sodium  Buff-a-Comp Enoxaparin Orudis Zorpin  Buff-a-Comp with Codeine Equegesic Os-Cal-Gesic   Buffaprin Excedrin plain, buffered or Extra Strength Oxalid   Bufferin Arthritis Strength Feldene Oxphenbutazone   Bufferin plain or Extra Strength Feldene Capsules Oxycodone with Aspirin   Bufferin with Codeine Fenoprofen Fenoprofen Pabalate or Pabalate-SF   Buffets II Flogesic Panagesic   Buffinol plain or Extra Strength Florinal or Florinal with  Codeine Panwarfarin   Buf-Tabs Flurbiprofen Penicillamine   Butalbital Compound Four-way cold tablets Penicillin   Butazolidin Fragmin Pepto-Bismol   Carbenicillin Geminisyn Percodan   Carna Arthritis Reliever Geopen Persantine   Carprofen Gold's  salt Persistin   Chloramphenicol Goody's Phenylbutazone   Chloromycetin Haltrain Piroxlcam   Clmetidine heparin Plaquenil   Cllnoril Hyco-pap Ponstel   Clofibrate Hydroxy chloroquine Propoxyphen         Before stopping any of these medications, be sure to consult the physician who ordered them.  Some, such as Coumadin (Warfarin) are ordered to prevent or treat serious conditions such as "deep thrombosis", "pumonary embolisms", and other heart problems.  The amount of time that you may need off of the medication may also vary with the medication and the reason for which you were taking it.  If you are taking any of these medications, please make sure you notify your pain physician before you undergo any procedures.

## 2018-04-28 NOTE — Progress Notes (Signed)
Safety precautions to be maintained throughout the outpatient stay will include: orient to surroundings, keep bed in low position, maintain call bell within reach at all times, provide assistance with transfer out of bed and ambulation.  

## 2018-04-28 NOTE — Progress Notes (Signed)
Patient's Name: Maria Hamilton  MRN: [id number]  Referring Provider: Glori Luis, MD  DOB: 1951/09/01  PCP: Glori Luis, MD  DOS: 04/28/2018  Note by: Oswaldo Done, MD  Service setting: Ambulatory outpatient  Specialty: Interventional Pain Management  Location: [facility] (AMB) Pain Management Facility    Patient type: Established   Primary Reason(s) for Visit: Evaluation of chronic illnesses with exacerbation, or progression (Level of risk: moderate) CC: Back Pain (low); Leg Pain (left); and Foot Pain (foot is numb on the left)  HPI  [name] is a 67 y.o. year old, female patient, who comes today for a follow-up evaluation. She has GERD (gastroesophageal reflux disease); Rheumatoid factor positive; Chronic fatigue; Vitamin D deficiency; Insomnia; Fibromyalgia; Chronic low back pain (Primary Source of Pain) (Left); Chronic pain syndrome; Depression; Hypertension; 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); Long term current use of opiate analgesic; Long term prescription opiate use; Opiate use; Encounter for therapeutic drug level monitoring; Encounter for pain management planning; Rheumatoid arthritis, multiple sites (positive RF); Chronic lower extremity pain (Secondary source of pain) (Left); Lumbar facet arthropathy (Bilateral); Failed back surgical syndrome (x 3); Epidural fibrosis; Neurogenic pain; Musculoskeletal pain; Chronic lumbar radicular pain (L5/S1 dermatome) (Left); History of total hip replacement, bilateral; Hypokalemia; Constipation; Therapeutic opioid-induced constipation (OIC); Palpitations; Epigastric pain; Memory difficulty; Polyneuropathy; Post herpetic neuralgia; Abnormal MRI, lumbar spine (03/06/2017); 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); History of lumbar fusion (L3-4, L4-5, and L5-S1); Vertigo; Chronic foot numbness (Left); Night sweats; Supraclavicular fossa fullness; RLS (restless legs syndrome); Ankle pain; Anxiety and  depression; Viral URI with cough; History of allergy to radiographic contrast media; Degeneration of lumbar intervertebral disc; Osteoarthritis of hip; Vitamin B 12 deficiency; Pain of both shoulder joints; Spondylosis without myelopathy or radiculopathy, lumbosacral region; Acute anxiety; Chronic hip pain after total replacement of hip joint (Left); Chronic hip pain after total replacement of hip joint (Right); Chronic pain of hip  (Bilateral) (L>R); Chronic sacroiliac joint pain (Bilateral) (L>R); and Lumbar facet syndrome (Bilateral) (L>R) on their problem list. [name] was last seen on 01/13/2018. Her primarily concern today is the Back Pain (low); Leg Pain (left); and Foot Pain (foot is numb on the left)  Pain Assessment: Location: Lower Back Radiating: left leg and foot Onset: More than a month ago Duration: Chronic pain Quality: Constant, Throbbing(dreadful) Severity: 9 /10 (subjective, self-reported pain score)  Note: Reported level is inconsistent with clinical observations. Clinically the patient looks like a 3/10 A 3/10 is viewed as "Moderate" and described as significantly interfering with activities of daily living (ADL). It becomes difficult to feed, bathe, get dressed, get on and off the toilet or to perform personal hygiene functions. Difficult to get in and out of bed or a chair without assistance. Very distracting. With effort, it can be ignored when deeply involved in activities. Information on the proper use of the pain scale provided to the patient today. When using our objective Pain Scale, levels between 6 and 10/10 are said to belong in an emergency room, as it progressively worsens from a 6/10, described as severely limiting, requiring emergency care not usually available at an outpatient pain management facility. At a 6/10 level, communication becomes difficult and requires great effort. Assistance to reach the emergency department may be required. Facial flushing and profuse  sweating along with potentially dangerous increases in heart rate and blood pressure will be evident. Timing: Constant Modifying factors: "nothing" BP: (!) 160/90  HR: 72  The patient is  an extremely poor historian, does not bring her medications to her appointments, cannot tell us if she is actually taking the medications at home or not, and the feedback that were getting from her in terms of the diagnostic injections may be questionable. In addition, the patient also complains of a pain that she describes as a 9/10 despite the fact that she has been provided with copies of our objective pain scale, several times. Despite that, she continues not to use our scale, does not understand how her words, is not reading the information material that we are providing her, or simply is symptom exaggerating.  Further details on both, my assessment(s), as well as the proposed treatment plan, please see below.  Laboratory Chemistry  Inflammation Markers (CRP: Acute Phase) (ESR: Chronic Phase) Lab Results  Component Value Date   CRP <0.8 09/25/2016   ESRSEDRATE 60 (H) 04/02/2018   LATICACIDVEN 1.0 10/24/2016                         Rheumatology Markers Lab Results  Component Value Date   RF <14 04/02/2018   ANA NEGATIVE 04/02/2018                        Renal Function Markers Lab Results  Component Value Date   BUN 7 04/02/2018   CREATININE 0.80 04/02/2018   GFRAA >60 11/15/2016   GFRNONAA >60 11/15/2016                              Hepatic Function Markers Lab Results  Component Value Date   AST 13 04/02/2018   ALT 6 04/02/2018   ALBUMIN 3.7 04/02/2018   ALKPHOS 117 04/02/2018   LIPASE 9.0 (L) 11/26/2016                        Electrolytes Lab Results  Component Value Date   NA 140 04/02/2018   K 3.1 (L) 04/16/2018   CL 100 04/02/2018   CALCIUM 9.2 04/02/2018   MG 1.8 11/22/2017                        Neuropathy Markers Lab Results  Component Value Date   VITAMINB12 220  11/22/2017   HIV NONREACTIVE 03/06/2017                        Bone Pathology Markers Lab Results  Component Value Date   25OHVITD1 14 (L) 09/25/2016   25OHVITD2 6.4 09/25/2016   25OHVITD3 7.7 09/25/2016                         Coagulation Parameters Lab Results  Component Value Date   PLT 326.0 11/22/2017                        Cardiovascular Markers Lab Results  Component Value Date   CKTOTAL 72 04/02/2018   TROPONINI <0.03 07/13/2016   HGB 12.1 11/22/2017   HCT 36.8 11/22/2017                         Note: Lab results reviewed.  Recent Diagnostic Imaging Review  Lumbosacral Imaging: Lumbar MR wo contrast:  Results for orders placed during the hospital encounter of 07/20/16  MR Lumbar Spine Wo Contrast   Narrative CLINICAL DATA:  Severe low back pain and left leg pain.  EXAM: MRI LUMBAR SPINE WITHOUT CONTRAST  TECHNIQUE: Multiplanar, multisequence MR imaging of the lumbar spine was performed. No intravenous contrast was administered.  COMPARISON:  Radiographs dated 07/20/2016 and lumbar MRI dated 01/06/2016  FINDINGS: Segmentation:  Normal.  Alignment: Minimal chronic retrolisthesis of L2 on L3, 2.5 mm. Otherwise normal.  Vertebrae: Solid interbody fusion at L3-4 and L4-5. Interbody fusion device and pedicle screws at L5-S1. No fractures or bone destruction.  Conus medullaris: Extends to the L1 level and appears normal.  Paraspinal and other soft tissues: Postsurgical changes in the posterior soft tissues at the operative levels in the lumbar spine. Benign cyst on the lateral aspect of the right kidney, stable.  Disc levels:  T12-L1:  Normal.  L1-2: Disc desiccation with a minimal retrolisthesis without neural impingement. No disc bulging or protrusion. Minimal degenerative changes the left facet joint.  L2-3: Disc desiccation with minimal retrolisthesis. Tiny disc bulge into the left neural foramen with no neural impingement.  Moderate bilateral facet arthritis, slightly progressed.  L3-4: Pedicle screws have been removed.  Are L4-5: Solid interbody fusion. No residual neural impingement. Widely patent neural foramina.  L5-S1: New interbody and posterior fusion. Ill-defined soft tissue fills the left neural foramen best seen on image 35 of series 6 and on images 14 of series 2, 3 and 4. This could represent scar tissue and less likely a disc fragment and could irritate the left L5 nerve. No visible solid fusion. Thecal sac is not compressed.  IMPRESSION: 1. Poorly defined soft tissue fills the left neural foramen at L5-S1. This could represent postsurgical scarring or a disc fragment. This could irritate the left L5 nerve. 2. Slight progression of bilateral facet arthritis at L2-3.   Electronically Signed   By: Francene Boyers M.D.   On: 07/20/2016 16:24    Lumbar MR w/wo contrast:  Results for orders placed during the hospital encounter of 03/06/17  MR LUMBAR SPINE W WO CONTRAST   Narrative CLINICAL DATA:  Low back pain for 1 week. Pain extends into the left lower extremity to the foot. Numbness in the foot. Multiple prior lumbar spine surgeries.  EXAM: MRI LUMBAR SPINE WITHOUT AND WITH CONTRAST  TECHNIQUE: Multiplanar and multiecho pulse sequences of the lumbar spine were obtained without and with intravenous contrast.  CONTRAST:  20mL MULTIHANCE GADOBENATE DIMEGLUMINE 529 MG/ML IV SOLN  COMPARISON:  MRI of the lumbar spine 07/20/2016  FINDINGS: Segmentation: 5 non rib-bearing lumbar type vertebral bodies are present.  Alignment: AP alignment is anatomic. No significant scoliosis is present.  Vertebrae: Mild endplate marrow changes are noted anteriorly at L1-2. Marrow signal and vertebral body heights are otherwise normal. There is no pathologic enhancement within the marrow containing spaces. There is some artifact from hardware at L5-S1.  Conus medullaris: Extends to the L1 level  and appears normal.  Paraspinal and other soft tissues: Right renal cysts is stable. Limited imaging of the abdomen is otherwise unremarkable. No significant adenopathy is present.  Disc levels:  L1-2: Mild facet hypertrophy and disc bulging is stable. No significant stenosis or change is present.  L2-3: A broad-based disc protrusion is asymmetric to the left. Moderate facet hypertrophy is seen bilaterally. There is slight progression of mild left subarticular and foraminal stenosis. The right foramen is patent.  L3-4: Solid fusion is stable. No residual or recurrent stenosis is present.  L4-5: Solid fusion is  stable. No residual or recurrent stenosis is present.  L5-S1: Posterior lumbar fusion is noted. Previously noted soft tissue in the left foramen is less evident on today's study. There is no residual or recurrent stenosis.  IMPRESSION: 1. Progressive facet arthropathy and a broad-based disc protrusion at L2-3 with slight progression of mild left subarticular and foraminal stenosis. 2. Stable solid fusion at L3-4 L4-5 without residual or recurrent stenosis at either level. 3. Posterior lumbar fusion at L5-S1 without significant residual or recurrent stenosis.   Electronically Signed   By: Marin Roberts M.D.   On: 03/06/2017 12:27    Lumbar CT w contrast:  Results for orders placed in visit on 07/16/02  CT Lumbar Spine W Contrast   Narrative FINDINGS CLINICAL DATA:  BACK AND LEFT LEG PAIN. LUMBAR DISKOGRAM THE PATIENT WAS GIVEN EXTENSIVE INFORMED CONSENT PRIOR TO THE PROCEDURE INCLUDING THE RISK OF PAIN, INFECTION, AND NEUROLOGIC DEFICIT.  SPECIFICALLY, THE RISK OF DISKITIS AND OSTEOMYELITIS WERE DISCUSSED.  THE PATIENT AGREED TO PROCEED.  THE PATIENT RECEIVED VANCOMYCIN AS OUTLINED ON THE NURSING SHEET PRIOR TO THE PROCEDURE.  AN APPROPRIATE AMOUNT OF VANCOMYCIN WAS ADDED TO THE CONTRAST (OMNIPAQUE 180).  HER BACK WAS PREPARED WITH A STERILE SCRUB SPONGE  FOR FIVE MINUTES. FOLLOWED BY APPLICATION OF BETADINE SOLUTION.  STERILE DRAPES WERE APPLIED.  STRICT STERILE TECHNIQUE WAS USED BY EVERYONE IN THE ROOM.  INCREMENTAL DOSES OF VERSED WERE USED FOR CONSCIOUS SEDATION.  A RIGHT PARASPINOUS APPROACH WAS TAKEN TO THE LOWER FOUR DISK SPACES AS FOLLOWS: L2-3:  OPENING PRESSURE 20 PSI.  PRESSURE AT PAIN RESPONSE 80 PSI. QUANTITY OF CONTRAST 2.5 ML. LEVEL OF PAIN WAS 6 ON A SCALE OF 1 TO 10.  SHE FELT PRESSURE IN THE BACK WHICH WAS NOT TYPICAL FOR HER PAIN AT HOME. L3-4:  OPENING PRESSURE 20 PSI.  PRESSURE AT PAIN RESPONSE 20 PSI. QUANTITY OF CONTRAST 3 ML. LEVEL OF PAIN WAS 10 ON A SCALE OF 1 TO 10.  SHE HAD A HEAVY FEELING IN THE CENTER OF HER BACK WHICH WAS SOMEWHAT LIKE THE PAIN SHE EXPERIENCED AT HOME.  SHE FELT AS IF A CINDER BLOCK WAS LYING ON HER BACK. L4-5:  OPENING PRESSURE 30 PSI.  PRESSURE AT PAIN RESPONSE 30 PSI.  QUANTITY OF CONTRAST WAS 2.5 ML.  LEVEL OF PAIN WAS 10 ON A SCALE ON 1 TO 10.  SHE EXPERIENCED SEVERE PAIN IN THE CENTER OF HER BACK AND RADIATING DOWN HER LEFT LEG. THIS  WAS LIKE THE PAIN SHE HAD AT HOME. L5-S1:  OPENING PRESSURE 20 PSI.  PRESSURE AT PAIN RESPONSE 50 PSI.  QUANTITY OF CONTRAST 3 ML. LEVEL OF PAIN WAS 6 ON A SCALE OF 1 TO 10.  SHE EXPERIENCED SOME MID BACK PAIN EXTENDING TO THE RIGHT, BUT THIS DID NOT CORRELATE WITH HER PAIN AT HOME. THE L2-3 DISK WAS MORPHOLOGICALLY NORMAL, AND THE L5-S1 DISK DID NOT APPEAR TO SHOW ANY SIGNIFICANT ABNORMALITY AS WELL.  THE L4-5 DISK WAS MARKEDLY DEGENERATED WITH CONTRAST EXTENDING RIGHT AND LEFT AS WELL AS INTO THE EPIDURAL SPACE.  THE L3-4 DISK SHOWED AN ANNULAR TEAR OFF THE RIGHT.  THERE WAS ALSO EXTENSION POSTERIORLY INTO THE CANAL. POST MYELOGRAM CT THE LOWER FOUR DISK SPACES WERE EXAMINED: L2-3:  NORMAL INTERSPACE. L3-4:  ANNULAR RENT IN THE FORAMEN TO THE RIGHT.  RIGHT L3 AND POSSIBLY RIGHT L4 NERVE ROOT ENCROACHMENT WERE OBSERVED. L4-5:  DIFFUSE DISKAL DEGENERATION  WITH LOSS OF INTERSPACE HEIGHT,  OSTEOPHYTE FORMATION, AND MULTIPLE ANNULAR RENTS  EXTENDING CIRCUMFERENTIALLY.  THERE WAS NO DISCRETE ANNULAR RENT ON THE LEFT.  THERE DID APPEAR TO BE SIGNIFICANT FORAMINAL NARROWING ON THE LEFT DUE TO A COMBINATION OF BONY OVERGROWTH FROM THE FACET AS WELL AS OSTEOPHYTE FORMATION. L5-S1:  SMALL AMOUNT OF CONTRAST EXTENDS TO THE RIGHT AND MAY BE SECONDARY TO THE NEEDLE PLACEMENT.  NO OTHER ANNULAR RENTS ARE SEEN. IMPRESSION 1)   ABNORMAL DISKOGRAPHY AT L4-5 WITH CONCORDANT REPRODUCTION OF THE PATIENT'S PAIN; THERE IS DIFFUSE DISKAL DEGENERATION WITH THE MOST PROMINENT LEFT SIDED ENCROACHMENT BEING IN THE FORAMEN. 2)   ABNORMAL LUMBAR DISKOGRAPHY AT L3-4 WITH REPRODUCTION OF BACK PAIN, BUT NO RADIATION DOWN THE LEFT LEG.  CT IS NOTABLE FOR AN ANNULAR RENT EXTENDING INTO THE FORAMEN ON THE RIGHT. 3)   INJECTION OF L2-3 AND L5-S1 DID GENERATE SOME BACK PAIN FOR THE PATIENT, BUT THERE WAS NOT CONVINCING MORPHOLOGIC ABNORMALITIES OF THE DISK.   Lumbar DG 2-3 views:  Results for orders placed in visit on 08/20/02  DG Lumbar Spine 2-3 Views   Narrative FINDINGS CLINICAL DATA:  LUMBAR HNP WITH MYELOPATHY FOR L3-5 PLIF. C-ARM FLUOROSCOPY X THREE FLUOROSCOPY WAS UTILIZED BY THE REQUESTING PHYSICIAN. IMPRESSION NO RADIOGRAPHIC INTERPRETATION. LUMBAR SPINE, TWO VIEWS C-ARM SPOT FILMS ARE OBTAINED IN THE PA AND CROSS TABLE LATERAL PROJECTIONS REVEALING PLACEMENT OF PAIRED TRANSPEDICULAR SCREWS AT L3, L4 AND L5.  INTERCONNECTING SCREWS POSTERIORLY BY METALLIC BARS.  HARDWARE APPEARS IN SATISFACTORY POSITION AND ALIGNMENT.  NO SPONDYLOLISTHESIS.  SURGICAL RETRACTORS ARE IN PLACE AT THE LAMINECTOMY SITE. IMPRESSION INTRAOPERATIVE C-ARM FILMS SUGGEST SATISFACTORY PLACEMENT OF TRANSPEDICULAR SCREWS AT L3 TO L5.   Lumbar DG (Complete) 4+V:  Results for orders placed during the hospital encounter of 07/20/16  DG Lumbar Spine Complete   Narrative CLINICAL DATA:  Low back  pain for 1 week extending into the left lower extremity.  EXAM: LUMBAR SPINE - COMPLETE 4+ VIEW  COMPARISON:  MRI of the lumbar spine 01/06/2016. Lumbar spine radiographs 08/19/2013.  FINDINGS: There is lumbar fusion is again noted at at L3-4 and L4-5. Previous pedicle screws at L3 and L4 have been removed.  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 Lucency is noted about the L5 and S1 screws. Disc spacer is in satisfactory position.  Bilateral total hip arthroplasty is noted.  The soft tissues are unremarkable.  IMPRESSION: 1. Interval L5-S1 fusion. 2. Lucency about the L5 and S1 pedicle screws suggests motion an loosening. 3. Solid lumbar fusion at L3-4 and L4-5.   Electronically Signed   By: Marin Roberts M.D.   On: 07/20/2016 15:57    Lumbar DG Diskogram views:  Results for orders placed in visit on 07/16/02  DG Diskogram Lumbar   Narrative FINDINGS CLINICAL DATA:  BACK AND LEFT LEG PAIN. LUMBAR DISKOGRAM THE PATIENT WAS GIVEN EXTENSIVE INFORMED CONSENT PRIOR TO THE PROCEDURE INCLUDING THE RISK OF PAIN, INFECTION, AND NEUROLOGIC DEFICIT.  SPECIFICALLY, THE RISK OF DISKITIS AND OSTEOMYELITIS WERE DISCUSSED.  THE PATIENT AGREED TO PROCEED.  THE PATIENT RECEIVED VANCOMYCIN AS OUTLINED ON THE NURSING SHEET PRIOR TO THE PROCEDURE.  AN APPROPRIATE AMOUNT OF VANCOMYCIN WAS ADDED TO THE CONTRAST (OMNIPAQUE 180).  HER BACK WAS PREPARED WITH A STERILE SCRUB SPONGE FOR FIVE MINUTES. FOLLOWED BY APPLICATION OF BETADINE SOLUTION.  STERILE DRAPES WERE APPLIED.  STRICT STERILE TECHNIQUE WAS USED BY EVERYONE IN THE ROOM.  INCREMENTAL DOSES OF VERSED WERE USED FOR CONSCIOUS SEDATION.  A RIGHT PARASPINOUS APPROACH WAS TAKEN TO THE LOWER FOUR DISK SPACES AS  FOLLOWS: L2-3:  OPENING PRESSURE 20 PSI.  PRESSURE AT PAIN RESPONSE 80 PSI. QUANTITY OF CONTRAST 2.5 ML. LEVEL OF PAIN WAS 6 ON A SCALE OF 1 TO 10.  SHE FELT PRESSURE IN THE BACK WHICH WAS NOT TYPICAL FOR HER PAIN AT  HOME. L3-4:  OPENING PRESSURE 20 PSI.  PRESSURE AT PAIN RESPONSE 20 PSI. QUANTITY OF CONTRAST 3 ML. LEVEL OF PAIN WAS 10 ON A SCALE OF 1 TO 10.  SHE HAD A HEAVY FEELING IN THE CENTER OF HER BACK WHICH WAS SOMEWHAT LIKE THE PAIN SHE EXPERIENCED AT HOME.  SHE FELT AS IF A CINDER BLOCK WAS LYING ON HER BACK. L4-5:  OPENING PRESSURE 30 PSI.  PRESSURE AT PAIN RESPONSE 30 PSI.  QUANTITY OF CONTRAST WAS 2.5 ML.  LEVEL OF PAIN WAS 10 ON A SCALE ON 1 TO 10.  SHE EXPERIENCED SEVERE PAIN IN THE CENTER OF HER BACK AND RADIATING DOWN HER LEFT LEG. THIS  WAS LIKE THE PAIN SHE HAD AT HOME. L5-S1:  OPENING PRESSURE 20 PSI.  PRESSURE AT PAIN RESPONSE 50 PSI.  QUANTITY OF CONTRAST 3 ML. LEVEL OF PAIN WAS 6 ON A SCALE OF 1 TO 10.  SHE EXPERIENCED SOME MID BACK PAIN EXTENDING TO THE RIGHT, BUT THIS DID NOT CORRELATE WITH HER PAIN AT HOME. THE L2-3 DISK WAS MORPHOLOGICALLY NORMAL, AND THE L5-S1 DISK DID NOT APPEAR TO SHOW ANY SIGNIFICANT ABNORMALITY AS WELL.  THE L4-5 DISK WAS MARKEDLY DEGENERATED WITH CONTRAST EXTENDING RIGHT AND LEFT AS WELL AS INTO THE EPIDURAL SPACE.  THE L3-4 DISK SHOWED AN ANNULAR TEAR OFF THE RIGHT.  THERE WAS ALSO EXTENSION POSTERIORLY INTO THE CANAL. POST MYELOGRAM CT THE LOWER FOUR DISK SPACES WERE EXAMINED: L2-3:  NORMAL INTERSPACE. L3-4:  ANNULAR RENT IN THE FORAMEN TO THE RIGHT.  RIGHT L3 AND POSSIBLY RIGHT L4 NERVE ROOT ENCROACHMENT WERE OBSERVED. L4-5:  DIFFUSE DISKAL DEGENERATION WITH LOSS OF INTERSPACE HEIGHT,  OSTEOPHYTE FORMATION, AND MULTIPLE ANNULAR RENTS EXTENDING CIRCUMFERENTIALLY.  THERE WAS NO DISCRETE ANNULAR RENT ON THE LEFT.  THERE DID APPEAR TO BE SIGNIFICANT FORAMINAL NARROWING ON THE LEFT DUE TO A COMBINATION OF BONY OVERGROWTH FROM THE FACET AS WELL AS OSTEOPHYTE FORMATION. L5-S1:  SMALL AMOUNT OF CONTRAST EXTENDS TO THE RIGHT AND MAY BE SECONDARY TO THE NEEDLE PLACEMENT.  NO OTHER ANNULAR RENTS ARE SEEN. IMPRESSION 1)   ABNORMAL DISKOGRAPHY AT L4-5 WITH  CONCORDANT REPRODUCTION OF THE PATIENT'S PAIN; THERE IS DIFFUSE DISKAL DEGENERATION WITH THE MOST PROMINENT LEFT SIDED ENCROACHMENT BEING IN THE FORAMEN. 2)   ABNORMAL LUMBAR DISKOGRAPHY AT L3-4 WITH REPRODUCTION OF BACK PAIN, BUT NO RADIATION DOWN THE LEFT LEG.  CT IS NOTABLE FOR AN ANNULAR RENT EXTENDING INTO THE FORAMEN ON THE RIGHT. 3)   INJECTION OF L2-3 AND L5-S1 DID GENERATE SOME BACK PAIN FOR THE PATIENT, BUT THERE WAS NOT CONVINCING MORPHOLOGIC ABNORMALITIES OF THE DISK.   Complexity Note: Imaging results reviewed. Results shared with Ms. Lacroix, using Layman's terms.                         Meds   Current Outpatient Medications:  .  acetaminophen (TYLENOL) 500 MG tablet, Take 1,000 mg by mouth at bedtime., Disp: , Rfl:  .  amLODipine (NORVASC) 10 MG tablet, Take 1 tablet (10 mg total) by mouth daily., Disp: 90 tablet, Rfl: 3 .  escitalopram (LEXAPRO) 10 MG tablet, Take 1 tablet (10 mg total) by mouth daily., Disp: 90 tablet, Rfl:  1 .  gabapentin (NEURONTIN) 600 MG tablet, Take 1 tablet (600 mg total) by mouth at bedtime., Disp: 120 tablet, Rfl: 0 .  potassium chloride SA (K-DUR,KLOR-CON) 20 MEQ tablet, Take 1 tablet (20 mEq total) by mouth daily., Disp: 180 tablet, Rfl: 0 .  pramipexole (MIRAPEX) 0.5 MG tablet, Take 1 tablet (0.5 mg total) by mouth daily., Disp: 30 tablet, Rfl: 2 .  pregabalin (LYRICA) 75 MG capsule, Take 75 mg by mouth 2 (two) times daily., Disp: , Rfl:  .  tizanidine (ZANAFLEX) 2 MG capsule, Take 1 capsule (2 mg total) by mouth 3 (three) times daily as needed for muscle spasms., Disp: 90 capsule, Rfl: 5  ROS  Constitutional: Denies any fever or chills Gastrointestinal: No reported hemesis, hematochezia, vomiting, or acute GI distress Musculoskeletal: Denies any acute onset joint swelling, redness, loss of ROM, or weakness Neurological: No reported episodes of acute onset apraxia, aphasia, dysarthria, agnosia, amnesia, paralysis, loss of coordination, or loss  of consciousness  Allergies  Ms. Goldsborough is allergic to ace inhibitors; ciprofloxacin; fentanyl; latex; morphine and related; penicillin g benzathine; isovue m [iopamidol]; and tape.  PFSH  Drug: Ms. Hosten  reports that she does not use drugs. Alcohol:  reports that she does not drink alcohol. Tobacco:  reports that she has never smoked. She has never used smokeless tobacco. Medical:  has a past medical history of Acute anxiety (08/03/2015), Acute bronchitis (12/31/2016), Acute pancreatitis (10/14/2016), Altered mental status (10/12/2016), Anxiety, Bronchitis, Chronic hip pain, Chronic pain syndrome, Depression, Edema, Fatigue, Fibromyalgia, GERD (gastroesophageal reflux disease), Headache, Hypertension, Insomnia, Low back pain, Right upper quadrant abdominal pain, and Vitamin D deficiency. Surgical: Ms. Hamideh  has a past surgical history that includes Breast surgery; Back surgery; Ectopic pregnancy surgery; Hip surgery; Abdominal hysterectomy; cyst removal from wrist; and Cholecystectomy (N/A, 10/16/2016). Family: family history includes Alcohol abuse in her father; Cancer in her father; Heart disease in her mother.  Constitutional Exam  General appearance: Well nourished, well developed, and well hydrated. In no apparent acute distress Vitals:   04/28/18 0907  BP: (!) 160/90  Pulse: 72  Resp: 18  Temp: 98.4 F (36.9 C)  TempSrc: Oral  SpO2: 100%  Weight: 203 lb (92.1 kg)  Height: 6\' 1"  (1.854 m)   BMI Assessment: Estimated body mass index is 26.78 kg/m as calculated from the following:   Height as of this encounter: 6\' 1"  (1.854 m).   Weight as of this encounter: 203 lb (92.1 kg).  BMI interpretation table: BMI level Category Range association with higher incidence of chronic pain  <18 kg/m2 Underweight   18.5-24.9 kg/m2 Ideal body weight   25-29.9 kg/m2 Overweight Increased incidence by 20%  30-34.9 kg/m2 Obese (Class I) Increased incidence by 68%  35-39.9 kg/m2 Severe  obesity (Class II) Increased incidence by 136%  >40 kg/m2 Extreme obesity (Class III) Increased incidence by 254%   Patient's current BMI Ideal Body weight  Body mass index is 26.78 kg/m. Ideal body weight: 75.4 kg (166 lb 3.6 oz) Adjusted ideal body weight: 82.1 kg (180 lb 15 oz)   BMI Readings from Last 4 Encounters:  04/28/18 26.78 kg/m  04/02/18 26.73 kg/m  02/17/18 26.41 kg/m  01/20/18 25.60 kg/m   Wt Readings from Last 4 Encounters:  04/28/18 203 lb (92.1 kg)  04/02/18 202 lb 9.6 oz (91.9 kg)  02/17/18 200 lb 3.2 oz (90.8 kg)  01/20/18 194 lb (88 kg)  Psych/Mental status: Alert, oriented x 3 (person, place, & time)  Eyes: PERLA Respiratory: No evidence of acute respiratory distress  Cervical Spine Area Exam  Skin & Axial Inspection: No masses, redness, edema, swelling, or associated skin lesions Alignment: Symmetrical Functional ROM: Unrestricted ROM      Stability: No instability detected Muscle Tone/Strength: Functionally intact. No obvious neuro-muscular anomalies detected. Sensory (Neurological): Unimpaired Palpation: No palpable anomalies              Upper Extremity (UE) Exam    Side: Right upper extremity  Side: Left upper extremity  Skin & Extremity Inspection: Skin color, temperature, and hair growth are WNL. No peripheral edema or cyanosis. No masses, redness, swelling, asymmetry, or associated skin lesions. No contractures.  Skin & Extremity Inspection: Skin color, temperature, and hair growth are WNL. No peripheral edema or cyanosis. No masses, redness, swelling, asymmetry, or associated skin lesions. No contractures.  Functional ROM: Unrestricted ROM          Functional ROM: Unrestricted ROM          Muscle Tone/Strength: Functionally intact. No obvious neuro-muscular anomalies detected.  Muscle Tone/Strength: Functionally intact. No obvious neuro-muscular anomalies detected.  Sensory (Neurological): Unimpaired          Sensory (Neurological):  Unimpaired          Palpation: No palpable anomalies              Palpation: No palpable anomalies              Specialized Test(s): Deferred         Specialized Test(s): Deferred          Thoracic Spine Area Exam  Skin & Axial Inspection: No masses, redness, or swelling Alignment: Symmetrical Functional ROM: Unrestricted ROM Stability: No instability detected Muscle Tone/Strength: Functionally intact. No obvious neuro-muscular anomalies detected. Sensory (Neurological): Unimpaired Muscle strength & Tone: No palpable anomalies  Lumbar Spine Area Exam  Skin & Axial Inspection: No masses, redness, or swelling Alignment: Symmetrical Functional ROM: Unrestricted ROM       Stability: No instability detected Muscle Tone/Strength: Functionally intact. No obvious neuro-muscular anomalies detected. Sensory (Neurological): Unimpaired Palpation: No palpable anomalies       Provocative Tests: Lumbar Hyperextension and rotation test: evaluation deferred today       Lumbar Lateral bending test: evaluation deferred today       Patrick's Maneuver: evaluation deferred today                    Gait & Posture Assessment  Ambulation: Limited Gait: Antalgic Posture: Difficulty standing up straight, due to pain   Lower Extremity Exam    Side: Right lower extremity  Side: Left lower extremity  Stability: No instability observed          Stability: No instability observed          Skin & Extremity Inspection: Skin color, temperature, and hair growth are WNL. No peripheral edema or cyanosis. No masses, redness, swelling, asymmetry, or associated skin lesions. No contractures.  Skin & Extremity Inspection: Skin color, temperature, and hair growth are WNL. No peripheral edema or cyanosis. No masses, redness, swelling, asymmetry, or associated skin lesions. No contractures.  Functional ROM: Unrestricted ROM                  Functional ROM: Unrestricted ROM                  Muscle Tone/Strength:  Functionally intact. No obvious neuro-muscular anomalies detected.  Muscle Tone/Strength: Functionally intact.  No obvious neuro-muscular anomalies detected.  Sensory (Neurological): Unimpaired  Sensory (Neurological): Unimpaired  Palpation: No palpable anomalies  Palpation: No palpable anomalies   Assessment  Primary Diagnosis & Pertinent Problem List: The primary encounter diagnosis was Spondylosis without myelopathy or radiculopathy, lumbosacral region. Diagnoses of Chronic low back pain (Primary Source of Pain) (Left), Lumbar facet syndrome (Bilateral) (L>R), Lumbar facet arthropathy (Bilateral), and Chronic sacroiliac joint pain (Bilateral) (L>R) were also pertinent to this visit.  Status Diagnosis  Unimproved Worsening Persistent 1. Spondylosis without myelopathy or radiculopathy, lumbosacral region   2. Chronic low back pain (Primary Source of Pain) (Left)   3. Lumbar facet syndrome (Bilateral) (L>R)   4. Lumbar facet arthropathy (Bilateral)   5. 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 Has some decreased sensation in her left lower extremity that she reports has been present for several months. Recent MRI reviewed. She has a follow-up with her surgeon scheduled for next week. I discussed keeping this. I advised that given all the  medication she has tried for pain I am unsure what to give her at this time. She is to follow-up with her surgeon for further management. Given return precautions.   Vitamin B 12 Deficiency  Depression   Overview:  Last Assessment & Plan:  Well-controlled. Continue Lexapro. Klonopin as needed for anxiety. Given return precautions.  Overview:  Last Assessment & Plan:  Well-controlled. Continue Lexapro. Klonopin as needed for anxiety. Given return precautions.   Acute Anxiety   Overview:  Last Assessment & Plan:  Klonopin most helpful  Reports past taking it 1 tab am 1/2 tab midday and 1 tab pm  30 day supply faxed to pharmacy    Time Note: Greater than 50% of the 40 minute(s) of face-to-face time spent with Ms. Zembower, was spent in counseling/coordination of care regarding: the appropriate use of the pain scale, Ms. Rail primary cause of pain, the treatment plan, treatment alternatives, the results, interpretation and significance of  her recent diagnostic interventional treatment(s), the appropriate use of her medications, realistic expectations, the goals of pain management (increased in functionality), the importance of providing [place] with accurate post-procedure information and the need to collect and read the AVS material. Plan of Care  Pharmacotherapy (Medications Ordered): No orders of the defined types were placed in this encounter.  Medications administered today: Marcell Escovedo had no medications administered during this visit.   Procedure Orders     LUMBAR FACET(MEDIAL BRANCH NERVE BLOCK) [facility] Lab Orders  No laboratory test(s) ordered today   Imaging Orders  No imaging studies ordered today   Referral Orders  No referral(s) requested today    Interventional management options: Planned, scheduled, and/or pending:   Diagnostic left sided lumbar facet block #1 under fluoroscopic guidance and IV sedation    Considering:   Diagnostic/therapeutic Left L2-3  translaminar LESI #2 + left L2-3  TFESI #2  Diagnostic left caudal epidural steroid injection  Palliative RACZepidurolysis of adhesions #2    Palliative PRN treatment(s):   None at this time   Provider-requested follow-up: Return for Procedure (w/ sedation): (L) L-FCT BLK #1.  Future Appointments  Date Time Provider Department Center  04/30/2018  9:00 AM [facility]  05/06/2018 11:40 AM [facility]  05/14/2018  1:45 PM Glori Luis, MD [facility]  05/19/2018  9:00 AM Barbette Merino, NP [facility] None   Primary Care Physician: Glori Luis, MD Location: [facility] Note by: Oswaldo Done, MD Date: 04/28/2018; Time: 1:08 PM

## 2018-04-29 ENCOUNTER — Telehealth: Payer: Self-pay | Admitting: Family Medicine

## 2018-04-29 NOTE — Telephone Encounter (Signed)
Patients husband states it has helped slightly

## 2018-04-29 NOTE — Telephone Encounter (Signed)
Can you please contact the patient to see if the course of prednisone that we prescribed previously helped with her muscle aches? Thanks.

## 2018-04-29 NOTE — Telephone Encounter (Signed)
I would have expected this to be more beneficial.  Please see if she has continued to take the prednisone and see if it was more helpful when she was taking it versus when she came off of it.  We will likely need to have her see rheumatology for her muscle aches.  Thanks.

## 2018-04-30 ENCOUNTER — Other Ambulatory Visit (INDEPENDENT_AMBULATORY_CARE_PROVIDER_SITE_OTHER): Payer: Medicare HMO

## 2018-04-30 DIAGNOSIS — E876 Hypokalemia: Secondary | ICD-10-CM | POA: Diagnosis not present

## 2018-04-30 LAB — POTASSIUM: Potassium: 3.1 mEq/L — ABNORMAL LOW (ref 3.5–5.1)

## 2018-04-30 NOTE — Telephone Encounter (Signed)
Patient states she is still taking it, she states it has helped slightly but she is scheduled for an injection next week from the pain clinic. She states she will call back after that if she is still having pain

## 2018-04-30 NOTE — Telephone Encounter (Signed)
Noted  

## 2018-05-05 ENCOUNTER — Other Ambulatory Visit: Payer: Self-pay | Admitting: Family Medicine

## 2018-05-05 DIAGNOSIS — E876 Hypokalemia: Secondary | ICD-10-CM

## 2018-05-06 ENCOUNTER — Ambulatory Visit
Admission: RE | Admit: 2018-05-06 | Discharge: 2018-05-06 | Disposition: A | Discharge status: Home / Self Care | Payer: Medicare HMO | Source: Ambulatory Visit | Attending: Family Medicine | Admitting: Family Medicine

## 2018-05-06 ENCOUNTER — Telehealth: Payer: Self-pay

## 2018-05-06 DIAGNOSIS — E876 Hypokalemia: Secondary | ICD-10-CM

## 2018-05-06 DIAGNOSIS — Z1239 Encounter for other screening for malignant neoplasm of breast: Secondary | ICD-10-CM

## 2018-05-06 DIAGNOSIS — Z1231 Encounter for screening mammogram for malignant neoplasm of breast: Secondary | ICD-10-CM | POA: Insufficient documentation

## 2018-05-06 NOTE — Telephone Encounter (Signed)
-----   Message from Leone Haven, MD sent at 05/06/2018  3:11 PM EDT ----- Noted.  She should increase her potassium supplement to 40 mEq daily for the next 3 days and then go back to 20 mEq daily and we can plan on rechecking in 1 week.  Thanks.  Please place an order for a potassium for  hypokalemia.

## 2018-05-08 ENCOUNTER — Encounter: Payer: Self-pay | Admitting: Pain Medicine

## 2018-05-08 ENCOUNTER — Ambulatory Visit
Admission: RE | Admit: 2018-05-08 | Discharge: 2018-05-08 | Disposition: A | Discharge status: Home / Self Care | Payer: Medicare HMO | Source: Ambulatory Visit | Attending: Pain Medicine | Admitting: Pain Medicine

## 2018-05-08 ENCOUNTER — Ambulatory Visit (HOSPITAL_BASED_OUTPATIENT_CLINIC_OR_DEPARTMENT_OTHER): Payer: Medicare HMO | Admitting: Pain Medicine

## 2018-05-08 VITALS — BP 155/92 | HR 75 | Temp 98.2°F | Resp 16 | Ht 73.0 in | Wt 203.0 lb

## 2018-05-08 DIAGNOSIS — M47897 Other spondylosis, lumbosacral region: Secondary | ICD-10-CM | POA: Diagnosis not present

## 2018-05-08 DIAGNOSIS — M5442 Lumbago with sciatica, left side: Secondary | ICD-10-CM | POA: Insufficient documentation

## 2018-05-08 DIAGNOSIS — Z888 Allergy status to other drugs, medicaments and biological substances status: Secondary | ICD-10-CM | POA: Diagnosis not present

## 2018-05-08 DIAGNOSIS — Z885 Allergy status to narcotic agent status: Secondary | ICD-10-CM | POA: Insufficient documentation

## 2018-05-08 DIAGNOSIS — M47816 Spondylosis without myelopathy or radiculopathy, lumbar region: Secondary | ICD-10-CM | POA: Insufficient documentation

## 2018-05-08 DIAGNOSIS — M533 Sacrococcygeal disorders, not elsewhere classified: Secondary | ICD-10-CM

## 2018-05-08 DIAGNOSIS — M47817 Spondylosis without myelopathy or radiculopathy, lumbosacral region: Secondary | ICD-10-CM | POA: Insufficient documentation

## 2018-05-08 DIAGNOSIS — G8929 Other chronic pain: Secondary | ICD-10-CM | POA: Insufficient documentation

## 2018-05-08 DIAGNOSIS — Z881 Allergy status to other antibiotic agents status: Secondary | ICD-10-CM | POA: Diagnosis not present

## 2018-05-08 DIAGNOSIS — M1288 Other specific arthropathies, not elsewhere classified, other specified site: Secondary | ICD-10-CM | POA: Diagnosis not present

## 2018-05-08 DIAGNOSIS — Z79899 Other long term (current) drug therapy: Secondary | ICD-10-CM | POA: Diagnosis not present

## 2018-05-08 MED ORDER — LACTATED RINGERS IV SOLN
1000.0000 mL | Freq: Once | INTRAVENOUS | Status: AC
  Administered 2018-05-08: 1000 mL via INTRAVENOUS

## 2018-05-08 MED ORDER — ROPIVACAINE HCL 2 MG/ML IJ SOLN
9.0000 mL | Freq: Once | INTRAMUSCULAR | Status: AC
  Administered 2018-05-08: 9 mL via PERINEURAL

## 2018-05-08 MED ORDER — MIDAZOLAM HCL 5 MG/5ML IJ SOLN
INTRAMUSCULAR | Status: AC
  Filled 2018-05-08: qty 5

## 2018-05-08 MED ORDER — TRIAMCINOLONE ACETONIDE 40 MG/ML IJ SUSP
40.0000 mg | Freq: Once | INTRAMUSCULAR | Status: AC
  Administered 2018-05-08: 40 mg

## 2018-05-08 MED ORDER — DIPHENHYDRAMINE HCL 50 MG/ML IJ SOLN
INTRAMUSCULAR | Status: AC
  Filled 2018-05-08: qty 1

## 2018-05-08 MED ORDER — TRIAMCINOLONE ACETONIDE 40 MG/ML IJ SUSP
INTRAMUSCULAR | Status: AC
  Filled 2018-05-08: qty 1

## 2018-05-08 MED ORDER — FENTANYL CITRATE (PF) 100 MCG/2ML IJ SOLN
INTRAMUSCULAR | Status: AC
Start: 2018-05-08 — End: ?
  Filled 2018-05-08: qty 2

## 2018-05-08 MED ORDER — LIDOCAINE HCL 2 % IJ SOLN
20.0000 mL | Freq: Once | INTRAMUSCULAR | Status: AC
  Administered 2018-05-08: 400 mg

## 2018-05-08 MED ORDER — LIDOCAINE HCL 2 % IJ SOLN
INTRAMUSCULAR | Status: AC
  Filled 2018-05-08: qty 20

## 2018-05-08 MED ORDER — DIPHENHYDRAMINE HCL 50 MG/ML IJ SOLN
25.0000 mg | Freq: Once | INTRAMUSCULAR | Status: AC
  Administered 2018-05-08: 25 mg via INTRAVENOUS

## 2018-05-08 MED ORDER — SODIUM CHLORIDE 0.9 % IJ SOLN
INTRAMUSCULAR | Status: AC
  Filled 2018-05-08: qty 10

## 2018-05-08 MED ORDER — MIDAZOLAM HCL 5 MG/5ML IJ SOLN
1.0000 mg | INTRAMUSCULAR | Status: DC | PRN
  Administered 2018-05-08: 4 mg via INTRAVENOUS

## 2018-05-08 NOTE — Progress Notes (Addendum)
Patient's Name: Maria Hamilton  MRN: [id number]  Referring Provider: Delano Metz, MD  DOB: 11/14/1951  PCP: Glori Luis, MD  DOS: 05/08/2018  Note by: Oswaldo Done, MD  Service setting: Ambulatory outpatient  Specialty: Interventional Pain Management  Patient type: Established  Location: [facility] (AMB) Pain Management Facility  Visit type: Interventional Procedure   Primary Reason for Visit: Interventional Pain Management Treatment. CC: Back Pain (left lower)  Procedure:       Anesthesia, Analgesia, Anxiolysis:  Type: Lumbar Facet, Medial Branch Block(s) #1  Primary Purpose: Diagnostic Region: Posterolateral Lumbosacral Spine Level: L2, L3, L4, L5, & S1 Medial Branch Level(s). Injecting these levels blocks the L3-4, L4-5, and L5-S1 lumbar facet joints. Laterality: Left  Type: Moderate (Conscious) Sedation combined with Local Anesthesia Indication(s): Analgesia and Anxiety Route: Intravenous (IV) IV Access: Secured Sedation: Meaningful verbal contact was maintained at all times during the procedure  Local Anesthetic: Lidocaine 1-2%   Indications: 1. Spondylosis without myelopathy or radiculopathy, lumbosacral region   2. Lumbar facet syndrome (Bilateral) (L>R)   3. Lumbar facet arthropathy (Bilateral)   4. Chronic low back pain (Primary Source of Pain) (Left)    Pain Score: Pre-procedure: 3 /10 Post-procedure: 0-No pain/10  Pre-op Assessment:  Maria Hamilton is a 67 y.o. (year old), female patient, seen today for interventional treatment. She  has a past surgical history that includes Breast surgery; Back surgery; Ectopic pregnancy surgery; Hip surgery; Abdominal hysterectomy; cyst removal from wrist; Cholecystectomy (N/A, 10/16/2016); Oophorectomy; and Breast biopsy (Left, 90s). Maria Hamilton has a current medication list which includes the following prescription(s): acetaminophen, amlodipine, calcium carbonate, cholecalciferol, escitalopram, gabapentin, potassium  chloride sa, pramipexole, pregabalin, and tizanidine, and the following Facility-Administered Medications: midazolam. Her primarily concern today is the Back Pain (left lower)  Initial Vital Signs:  Pulse/HCG Rate: 70ECG Heart Rate: 71 Temp: 98.2 F (36.8 C) Resp: 16 BP: (!) 136/92 SpO2: 100 %  BMI: Estimated body mass index is 26.78 kg/m as calculated from the following:   Height as of this encounter: 6\' 1"  (1.854 m).   Weight as of this encounter: 203 lb (92.1 kg).  Risk Assessment: Allergies: Reviewed. She is allergic to ace inhibitors; ciprofloxacin; fentanyl; latex; morphine and related; penicillin g benzathine; isovue m [iopamidol]; and tape.  Allergy Precautions: None required Coagulopathies: Reviewed. None identified.  Blood-thinner therapy: None at this time Active Infection(s): Reviewed. None identified. Maria Hamilton is afebrile  Site Confirmation: Maria Hamilton was asked to confirm the procedure and laterality before marking the site Procedure checklist: Completed Consent: Before the procedure and under the influence of no sedative(s), amnesic(s), or anxiolytics, the patient was informed of the treatment options, risks and possible complications. To fulfill our ethical and legal obligations, as recommended by the American Medical Association's Code of Ethics, I have informed the patient of my clinical impression; the nature and purpose of the treatment or procedure; the risks, benefits, and possible complications of the intervention; the alternatives, including doing nothing; the risk(s) and benefit(s) of the alternative treatment(s) or procedure(s); and the risk(s) and benefit(s) of doing nothing. The patient was provided information about the general risks and possible complications associated with the procedure. These may include, but are not limited to: failure to achieve desired goals, infection, bleeding, organ or nerve damage, allergic reactions, paralysis, and death. In  addition, the patient was informed of those risks and complications associated to Spine-related procedures, such as failure to decrease pain; infection (i.e.: Meningitis, epidural or intraspinal abscess); bleeding (i.e.: epidural hematoma,  subarachnoid hemorrhage, or any other type of intraspinal or peri-dural bleeding); organ or nerve damage (i.e.: Any type of peripheral nerve, nerve root, or spinal cord injury) with subsequent damage to sensory, motor, and/or autonomic systems, resulting in permanent pain, numbness, and/or weakness of one or several areas of the body; allergic reactions; (i.e.: anaphylactic reaction); and/or death. Furthermore, the patient was informed of those risks and complications associated with the medications. These include, but are not limited to: allergic reactions (i.e.: anaphylactic or anaphylactoid reaction(s)); adrenal axis suppression; blood sugar elevation that in diabetics may result in ketoacidosis or comma; water retention that in patients with history of congestive heart failure may result in shortness of breath, pulmonary edema, and decompensation with resultant heart failure; weight gain; swelling or edema; medication-induced neural toxicity; particulate matter embolism and blood vessel occlusion with resultant organ, and/or nervous system infarction; and/or aseptic necrosis of one or more joints. Finally, the patient was informed that Medicine is not an exact science; therefore, there is also the possibility of unforeseen or unpredictable risks and/or possible complications that may result in a catastrophic outcome. The patient indicated having understood very clearly. We have given the patient no guarantees and we have made no promises. Enough time was given to the patient to ask questions, all of which were answered to the patient's satisfaction. Maria Hamilton has indicated that she wanted to continue with the procedure. Attestation: I, the ordering provider, attest that  I have discussed with the patient the benefits, risks, side-effects, alternatives, likelihood of achieving goals, and potential problems during recovery for the procedure that I have provided informed consent. Date  Time: 05/08/2018  9:05 AM  Pre-Procedure Preparation:  Monitoring: As per clinic protocol. Respiration, ETCO2, SpO2, BP, heart rate and rhythm monitor placed and checked for adequate function Safety Precautions: Patient was assessed for positional comfort and pressure points before starting the procedure. Time-out: I initiated and conducted the "Time-out" before starting the procedure, as per protocol. The patient was asked to participate by confirming the accuracy of the "Time Out" information. Verification of the correct person, site, and procedure were performed and confirmed by me, the nursing staff, and the patient. "Time-out" conducted as per Joint Commission's Universal Protocol (UP.01.01.01). Time: 0934  Description of Procedure:       Position: Prone Laterality: Left Levels:  L2, L3, L4, L5, & S1 Medial Branch Level(s) Area Prepped: Posterior Lumbosacral Region Prepping solution: ChloraPrep (2% chlorhexidine gluconate and 70% isopropyl alcohol) Safety Precautions: Aspiration looking for blood return was conducted prior to all injections. At no point did we inject any substances, as a needle was being advanced. Before injecting, the patient was told to immediately notify me if she was experiencing any new onset of "ringing in the ears, or metallic taste in the mouth". No attempts were made at seeking any paresthesias. Safe injection practices and needle disposal techniques used. Medications properly checked for expiration dates. SDV (single dose vial) medications used. After the completion of the procedure, all disposable equipment used was discarded in the proper designated medical waste containers. Local Anesthesia: Protocol guidelines were followed. The patient was positioned  over the fluoroscopy table. The area was prepped in the usual manner. The time-out was completed. The target area was identified using fluoroscopy. A 12-in long, straight, sterile hemostat was used with fluoroscopic guidance to locate the targets for each level blocked. Once located, the skin was marked with an approved surgical skin marker. Once all sites were marked, the skin (epidermis, dermis, and  hypodermis), as well as deeper tissues (fat, connective tissue and muscle) were infiltrated with a small amount of a short-acting local anesthetic, loaded on a 10cc syringe with a 25G, 1.5-in  Needle. An appropriate amount of time was allowed for local anesthetics to take effect before proceeding to the next step. Local Anesthetic: Lidocaine 2.0% The unused portion of the local anesthetic was discarded in the proper designated containers. Technical explanation of process:  L2 Medial Branch Nerve Block (MBB): The target area for the L2 medial branch is at the junction of the postero-lateral aspect of the superior articular process and the superior, posterior, and medial edge of the transverse process of L3. Under fluoroscopic guidance, a Quincke needle was inserted until contact was made with os over the superior postero-lateral aspect of the pedicular shadow (target area). After negative aspiration for blood, 0.5 mL of the nerve block solution was injected without difficulty or complication. The needle was removed intact. L3 Medial Branch Nerve Block (MBB): The target area for the L3 medial branch is at the junction of the postero-lateral aspect of the superior articular process and the superior, posterior, and medial edge of the transverse process of L4. Under fluoroscopic guidance, a Quincke needle was inserted until contact was made with os over the superior postero-lateral aspect of the pedicular shadow (target area). After negative aspiration for blood, 0.5 mL of the nerve block solution was injected without  difficulty or complication. The needle was removed intact. L4 Medial Branch Nerve Block (MBB): The target area for the L4 medial branch is at the junction of the postero-lateral aspect of the superior articular process and the superior, posterior, and medial edge of the transverse process of L5. Under fluoroscopic guidance, a Quincke needle was inserted until contact was made with os over the superior postero-lateral aspect of the pedicular shadow (target area). After negative aspiration for blood, 0.5 mL of the nerve block solution was injected without difficulty or complication. The needle was removed intact. L5 Medial Branch Nerve Block (MBB): The target area for the L5 medial branch is at the junction of the postero-lateral aspect of the superior articular process and the superior, posterior, and medial edge of the sacral ala. Under fluoroscopic guidance, a Quincke needle was inserted until contact was made with os over the superior postero-lateral aspect of the pedicular shadow (target area). After negative aspiration for blood, 0.5 mL of the nerve block solution was injected without difficulty or complication. The needle was removed intact. S1 Medial Branch Nerve Block (MBB): The target area for the S1 medial branch is at the posterior and inferior 6 o'clock position of the L5-S1 facet joint. Under fluoroscopic guidance, the Quincke needle inserted for the L5 MBB was redirected until contact was made with os over the inferior and postero aspect of the sacrum, at the 6 o' clock position under the L5-S1 facet joint (Target area). After negative aspiration for blood, 0.5 mL of the nerve block solution was injected without difficulty or complication. The needle was removed intact. Procedural Needles: 22-gauge, 3.5-inch, Quincke needles used for all levels. Nerve block solution: 0.2% PF-Ropivacaine + Triamcinolone (40 mg/mL) diluted to a final concentration of 4 mg of Triamcinolone/mL of Ropivacaine The unused  portion of the solution was discarded in the proper designated containers.  Once the entire procedure was completed, the treated area was cleaned, making sure to leave some of the prepping solution back to take advantage of its long term bactericidal properties.   Illustration of the posterior  view of the lumbar spine and the posterior neural structures. Laminae of L2 through S1 are labeled. DPRL5, dorsal primary ramus of L5; DPRS1, dorsal primary ramus of S1; DPR3, dorsal primary ramus of L3; FJ, facet (zygapophyseal) joint L3-L4; I, inferior articular process of L4; LB1, lateral branch of dorsal primary ramus of L1; IAB, inferior articular branches from L3 medial branch (supplies L4-L5 facet joint); IBP, intermediate branch plexus; MB3, medial branch of dorsal primary ramus of L3; NR3, third lumbar nerve root; S, superior articular process of L5; SAB, superior articular branches from L4 (supplies L4-5 facet joint also); TP3, transverse process of L3.  Vitals:   05/08/18 0945 05/08/18 0955 05/08/18 1005 05/08/18 1010  BP: (!) 139/102 (!) 168/94 (!) 162/90 (!) 155/92  Pulse: 75     Resp: 12 15 14 16   Temp:      TempSrc:      SpO2: 99% 100% 100% 99%  Weight:      Height:        Start Time: 0934 hrs. End Time: 0943 hrs.  Imaging Guidance (Spinal):  Type of Imaging Technique: Fluoroscopy Guidance (Spinal) Indication(s): Assistance in needle guidance and placement for procedures requiring needle placement in or near specific anatomical locations not easily accessible without such assistance. Exposure Time: Please see nurses notes. Contrast: None used. Fluoroscopic Guidance: I was personally present during the use of fluoroscopy. "Tunnel Vision Technique" used to obtain the best possible view of the target area. Parallax error corrected before commencing the procedure. "Direction-depth-direction" technique used to introduce the needle under continuous pulsed fluoroscopy. Once target was reached,  antero-posterior, oblique, and lateral fluoroscopic projection used confirm needle placement in all planes. Images permanently stored in EMR. Interpretation: No contrast injected. I personally interpreted the imaging intraoperatively. Adequate needle placement confirmed in multiple planes. Permanent images saved into the patient's record.  Antibiotic Prophylaxis:   Anti-infectives (From admission, onward)   None     Indication(s): None identified  Post-operative Assessment:  Post-procedure Vital Signs:  Pulse/HCG Rate: 7572 Temp: 98.2 F (36.8 C) Resp: 16 BP: (!) 155/92 SpO2: 99 %  EBL: None  Complications: No immediate post-treatment complications observed by team, or reported by patient.  Note: The patient tolerated the entire procedure well. A repeat set of vitals were taken after the procedure and the patient was kept under observation following institutional policy, for this type of procedure. Post-procedural neurological assessment was performed, showing return to baseline, prior to discharge. The patient was provided with post-procedure discharge instructions, including a section on how to identify potential problems. Should any problems arise concerning this procedure, the patient was given instructions to immediately contact us, at any time, without hesitation. In any case, we plan to contact the patient by telephone for a follow-up status report regarding this interventional procedure.  Comments:  Exaggerated response to the injection of local anesthetic.  Clearly the patient has no tolerance to any type of discomfort and/or she may be a symptom exaggerator.  Plan of Care   Possible POC:  She may be a poor candidate for RFA due to lack of intraprocedural compliance and symptom exaggeration.   Imaging Orders     DG C-Arm 1-60 Min-No Report  Procedure Orders     LUMBAR FACET(MEDIAL BRANCH NERVE BLOCK) MBNB  Medications ordered for procedure: Meds ordered this encounter   Medications  . lidocaine (XYLOCAINE) 2 % (with pres) injection 400 mg  . midazolam (VERSED) 5 MG/5ML injection 1-2 mg    Make sure Flumazenil  is available in the pyxis when using this medication. If oversedation occurs, administer 0.2 mg IV over 15 sec. If after 45 sec no response, administer 0.2 mg again over 1 min; may repeat at 1 min intervals; not to exceed 4 doses (1 mg)  . lactated ringers infusion 1,000 mL  . ropivacaine (PF) 2 mg/mL (0.2%) (NAROPIN) injection 9 mL  . triamcinolone acetonide (KENALOG-40) injection 40 mg  . diphenhydrAMINE (BENADRYL) injection 25 mg   Medications administered: We administered lidocaine, midazolam, lactated ringers, ropivacaine (PF) 2 mg/mL (0.2%), triamcinolone acetonide, and diphenhydrAMINE.  See the medical record for exact dosing, route, and time of administration.  New Prescriptions   No medications on file   Disposition: Discharge home  Discharge Date & Time: 05/08/2018; 1015 hrs.   Physician-requested Follow-up: Return for post-procedure eval (2 wks), w/ Dr. Laban Emperor.  Future Appointments  Date Time Provider Department Center  05/14/2018  1:45 PM Glori Luis, MD [facility]  05/19/2018  9:00 AM Barbette Merino, NP [facility] None  06/02/2018  9:15 AM Delano Metz, MD [facility] None   Primary Care Physician: Glori Luis, MD Location: [facility] Note by: Oswaldo Done, MD Date: 05/08/2018; Time: 10:46 AM  Disclaimer:  Medicine is not an exact science. The only guarantee in medicine is that nothing is guaranteed. It is important to note that the decision to proceed with this intervention was based on the information collected from the patient. The Data and conclusions were drawn from the patient's questionnaire, the interview, and the physical examination. Because the information was provided in large part by the patient, it cannot be guaranteed that it has not been purposely or  unconsciously manipulated. Every effort has been made to obtain as much relevant data as possible for this evaluation. It is important to note that the conclusions that lead to this procedure are derived in large part from the available data. Always take into account that the treatment will also be dependent on availability of resources and existing treatment guidelines, considered by other Pain Management Practitioners as being common knowledge and practice, at the time of the intervention. For Medico-Legal purposes, it is also important to point out that variation in procedural techniques and pharmacological choices are the acceptable norm. The indications, contraindications, technique, and results of the above procedure should only be interpreted and judged by a Board-Certified Interventional Pain Specialist with extensive familiarity and expertise in the same exact procedure and technique.

## 2018-05-08 NOTE — Progress Notes (Signed)
Arrived in recovery room itching all over.  No visible signs of rash.  Digging in skin all over.  Dr Dossie Arbour notified.  Benadryl 25 mg IV given slowly.  Rested wuietly without complaint for approx 10 mins.  States itching is much better at this time.

## 2018-05-08 NOTE — Patient Instructions (Addendum)

## 2018-05-09 ENCOUNTER — Telehealth: Payer: Self-pay

## 2018-05-09 NOTE — Telephone Encounter (Signed)
Post procedure phone call.  Patient states she is doing great.  Denies pain or itching.

## 2018-05-14 ENCOUNTER — Ambulatory Visit: Payer: Medicare HMO | Admitting: Family Medicine

## 2018-05-14 ENCOUNTER — Encounter: Payer: Self-pay | Admitting: Family Medicine

## 2018-05-14 DIAGNOSIS — Z0289 Encounter for other administrative examinations: Secondary | ICD-10-CM

## 2018-05-19 ENCOUNTER — Encounter: Payer: Medicare HMO | Admitting: Nurse Practitioner

## 2018-06-02 ENCOUNTER — Ambulatory Visit: Payer: Medicare HMO | Admitting: Pain Medicine

## 2018-06-02 NOTE — Progress Notes (Deleted)
Patient's Name: Maria Hamilton  MRN: [id number]  Referring Provider: Glori Luis, MD  DOB: June 03, 1951  PCP: Glori Luis, MD  DOS: 06/02/2018  Note by: Oswaldo Done, MD  Service setting: Ambulatory outpatient  Specialty: Interventional Pain Management  Location: [facility] (AMB) Pain Management Facility    Patient type: Established   Primary Reason(s) for Visit: Encounter for post-procedure evaluation of chronic illness with mild to moderate exacerbation CC: No chief complaint on file.  HPI  Maria Hamilton is a 67 y.o. year old, female patient, who comes today for a post-procedure evaluation. She has GERD (gastroesophageal reflux disease); Rheumatoid factor positive; Chronic fatigue; Vitamin D deficiency; Insomnia; Fibromyalgia; Chronic low back pain (Primary Source of Pain) (Left); Chronic pain syndrome; Depression; Hypertension; 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); Long term current use of opiate analgesic; Long term prescription opiate use; Opiate use; Encounter for therapeutic drug level monitoring; Encounter for pain management planning; Rheumatoid arthritis, multiple sites (positive RF); Chronic lower extremity pain (Secondary source of pain) (Left); Lumbar facet arthropathy (Bilateral); Failed back surgical syndrome (x 3); Epidural fibrosis; Neurogenic pain; Musculoskeletal pain; Chronic lumbar radicular pain (L5/S1 dermatome) (Left); History of total hip replacement, bilateral; Hypokalemia; Constipation; Therapeutic opioid-induced constipation (OIC); Palpitations; Epigastric pain; Memory difficulty; Polyneuropathy; Post herpetic neuralgia; Abnormal MRI, lumbar spine (03/06/2017); 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); History of lumbar fusion (L3-4, L4-5, and L5-S1); Vertigo; Chronic foot numbness (Left); Night sweats; Supraclavicular fossa fullness; RLS (restless legs syndrome); Ankle pain; Anxiety and depression; Viral URI with cough;  History of allergy to radiographic contrast media; Degeneration of lumbar intervertebral disc; Osteoarthritis of hip; Vitamin B 12 deficiency; Pain of both shoulder joints; Spondylosis without myelopathy or radiculopathy, lumbosacral region; Acute anxiety; Chronic hip pain after total replacement of hip joint (Left); Chronic hip pain after total replacement of hip joint (Right); Chronic pain of hip  (Bilateral) (L>R); Chronic sacroiliac joint pain (Bilateral) (L>R); and Lumbar facet syndrome (Bilateral) (L>R) on their problem list. Her primarily concern today is the No chief complaint on file.  Pain Assessment: Location:     Radiating:   Onset:   Duration:   Quality:   Severity:  /10 (subjective, self-reported pain score)  Note: Reported level is compatible with observation.                         When using our objective Pain Scale, levels between 6 and 10/10 are said to belong in an emergency room, as it progressively worsens from a 6/10, described as severely limiting, requiring emergency care not usually available at an outpatient pain management facility. At a 6/10 level, communication becomes difficult and requires great effort. Assistance to reach the emergency department may be required. Facial flushing and profuse sweating along with potentially dangerous increases in heart rate and blood pressure will be evident. Effect on ADL:   Timing:   Modifying factors:   BP:    HR:    Maria Hamilton comes in today for post-procedure evaluation after the treatment done on 05/19/2018.  Further details on both, my assessment(s), as well as the proposed treatment plan, please see below.  Post-Procedure Assessment  05/08/2018 Procedure: Diagnostic left sided lumbar facet block #1 under fluoroscopic guidance and IV sedation  Pre-procedure pain score:  3/10 Post-procedure pain score: 0/10 (100% relief) Influential Factors: BMI:   Intra-procedural challenges: None observed.         Assessment  challenges: None detected.  Reported side-effects: None.        Post-procedural adverse reactions or complications: None reported         Sedation: Sedation provided. When no sedatives are used, the analgesic levels obtained are directly associated to the effectiveness of the local anesthetics. However, when sedation is provided, the level of analgesia obtained during the initial 1 hour following the intervention, is believed to be the result of a combination of factors. These factors may include, but are not limited to: 1. The effectiveness of the local anesthetics used. 2. The effects of the analgesic(s) and/or anxiolytic(s) used. 3. The degree of discomfort experienced by the patient at the time of the procedure. 4. The patients ability and reliability in recalling and recording the events. 5. The presence and influence of possible secondary gains and/or psychosocial factors. Reported result: Relief experienced during the 1st hour after the procedure:   (Ultra-Short Term Relief)            Interpretative annotation: Clinically appropriate result. Analgesia during this period is likely to be Local Anesthetic and/or IV Sedative (Analgesic/Anxiolytic) related.          Effects of local anesthetic: The analgesic effects attained during this period are directly associated to the localized infiltration of local anesthetics and therefore cary significant diagnostic value as to the etiological location, or anatomical origin, of the pain. Expected duration of relief is directly dependent on the pharmacodynamics of the local anesthetic used. Long-acting (4-6 hours) anesthetics used.  Reported result: Relief during the next 4 to 6 hour after the procedure:   (Short-Term Relief)            Interpretative annotation: Clinically appropriate result. Analgesia during this period is likely to be Local Anesthetic-related.          Long-term benefit: Defined as the period of time past the expected  duration of local anesthetics (1 hour for short-acting and 4-6 hours for long-acting). With the possible exception of prolonged sympathetic blockade from the local anesthetics, benefits during this period are typically attributed to, or associated with, other factors such as analgesic sensory neuropraxia, antiinflammatory effects, or beneficial biochemical changes provided by agents other than the local anesthetics.  Reported result: Extended relief following procedure:   (Long-Term Relief)            Interpretative annotation: Clinically appropriate result. Good relief. No permanent benefit expected. Inflammation plays a part in the etiology to the pain.          Current benefits: Defined as reported results that persistent at this point in time.   Analgesia: *** %            Function: Somewhat improved ROM: Somewhat improved Interpretative annotation: Recurrence of symptoms. No permanent benefit expected. Effective diagnostic intervention.          Interpretation: Results would suggest a successful diagnostic intervention.                  Plan:  Please see "Plan of Care" for details.                Laboratory Chemistry  Inflammation Markers (CRP: Acute Phase) (ESR: Chronic Phase) Lab Results  Component Value Date   CRP <0.8 09/25/2016   ESRSEDRATE 60 (H) 04/02/2018   LATICACIDVEN 1.0 10/24/2016                         Renal Markers Lab Results  Component Value Date  BUN 7 04/02/2018   CREATININE 0.80 04/02/2018   GFRAA >60 11/15/2016   GFRNONAA >60 11/15/2016                             Hepatic Markers Lab Results  Component Value Date   AST 13 04/02/2018   ALT 6 04/02/2018   ALBUMIN 3.7 04/02/2018                        Neuropathy Markers Lab Results  Component Value Date   HIV NONREACTIVE 03/06/2017                        Hematology Parameters Lab Results  Component Value Date   PLT 326.0 11/22/2017   HGB 12.1 11/22/2017   HCT 36.8 11/22/2017                         CV Markers Lab Results  Component Value Date   CKTOTAL 72 04/02/2018   TROPONINI <0.03 07/13/2016                         Note: Lab results reviewed.  Recent Diagnostic Imaging Results  DG C-Arm 1-60 Min-No Report Fluoroscopy was utilized by the requesting physician.  No radiographic  interpretation.   Complexity Note: I personally reviewed the fluoroscopic imaging of the procedure.                        Meds   Current Outpatient Medications:  .  acetaminophen (TYLENOL) 500 MG tablet, Take 1,000 mg by mouth at bedtime., Disp: , Rfl:  .  amLODipine (NORVASC) 10 MG tablet, Take 1 tablet (10 mg total) by mouth daily., Disp: 90 tablet, Rfl: 3 .  calcium carbonate (OSCAL) 1500 (600 Ca) MG TABS tablet, Take 600 mg of elemental calcium by mouth daily with breakfast., Disp: , Rfl:  .  cholecalciferol (VITAMIN D) 1000 units tablet, Take 1,000 Units by mouth daily., Disp: , Rfl:  .  escitalopram (LEXAPRO) 10 MG tablet, Take 1 tablet (10 mg total) by mouth daily., Disp: 90 tablet, Rfl: 1 .  gabapentin (NEURONTIN) 600 MG tablet, Take 1 tablet (600 mg total) by mouth at bedtime., Disp: 120 tablet, Rfl: 0 .  potassium chloride SA (K-DUR,KLOR-CON) 20 MEQ tablet, Take 1 tablet (20 mEq total) by mouth daily., Disp: 180 tablet, Rfl: 0 .  pramipexole (MIRAPEX) 0.5 MG tablet, Take 1 tablet (0.5 mg total) by mouth daily., Disp: 30 tablet, Rfl: 2 .  pregabalin (LYRICA) 75 MG capsule, Take 75 mg by mouth 2 (two) times daily., Disp: , Rfl:  .  tizanidine (ZANAFLEX) 2 MG capsule, Take 1 capsule (2 mg total) by mouth 3 (three) times daily as needed for muscle spasms., Disp: 90 capsule, Rfl: 5  ROS  Constitutional: Denies any fever or chills Gastrointestinal: No reported hemesis, hematochezia, vomiting, or acute GI distress Musculoskeletal: Denies any acute onset joint swelling, redness, loss of ROM, or weakness Neurological: No reported episodes of acute onset apraxia, aphasia, dysarthria, agnosia,  amnesia, paralysis, loss of coordination, or loss of consciousness  Allergies  Ms. Belvin is allergic to ace inhibitors; ciprofloxacin; fentanyl; latex; morphine and related; penicillin g benzathine; isovue m [iopamidol]; and tape.  PFSH  Drug: Ms. Ameri  reports that she does not use drugs. Alcohol:  reports  that she does not drink alcohol. Tobacco:  reports that she has never smoked. She has never used smokeless tobacco. Medical:  has a past medical history of Acute anxiety (08/03/2015), Acute bronchitis (12/31/2016), Acute pancreatitis (10/14/2016), Altered mental status (10/12/2016), Anxiety, Bronchitis, Chronic hip pain, Chronic pain syndrome, Depression, Edema, Fatigue, Fibromyalgia, GERD (gastroesophageal reflux disease), Headache, Hypertension, Insomnia, Low back pain, Right upper quadrant abdominal pain, and Vitamin D deficiency. Surgical: Ms. Dietel  has a past surgical history that includes Breast surgery; Back surgery; Ectopic pregnancy surgery; Hip surgery; Abdominal hysterectomy; cyst removal from wrist; Cholecystectomy (N/A, 10/16/2016); Oophorectomy; and Breast biopsy (Left, 90s). Family: family history includes Alcohol abuse in her father; Cancer in her father; Heart disease in her mother.  Constitutional Exam  General appearance: Well nourished, well developed, and well hydrated. In no apparent acute distress There were no vitals filed for this visit. BMI Assessment: Estimated body mass index is 26.78 kg/m as calculated from the following:   Height as of 05/08/18: 6\' 1"  (1.854 m).   Weight as of 05/08/18: 203 lb (92.1 kg).  BMI interpretation table: BMI level Category Range association with higher incidence of chronic pain  <18 kg/m2 Underweight   18.5-24.9 kg/m2 Ideal body weight   25-29.9 kg/m2 Overweight Increased incidence by 20%  30-34.9 kg/m2 Obese (Class I) Increased incidence by 68%  35-39.9 kg/m2 Severe obesity (Class II) Increased incidence by 136%  >40  kg/m2 Extreme obesity (Class III) Increased incidence by 254%   Patient's current BMI Ideal Body weight  There is no height or weight on file to calculate BMI. Patient weight not recorded   BMI Readings from Last 4 Encounters:  05/08/18 26.78 kg/m  04/28/18 26.78 kg/m  04/02/18 26.73 kg/m  02/17/18 26.41 kg/m   Wt Readings from Last 4 Encounters:  05/08/18 203 lb (92.1 kg)  04/28/18 203 lb (92.1 kg)  04/02/18 202 lb 9.6 oz (91.9 kg)  02/17/18 200 lb 3.2 oz (90.8 kg)  Psych/Mental status: Alert, oriented x 3 (person, place, & time)       Eyes: PERLA Respiratory: No evidence of acute respiratory distress  Cervical Spine Area Exam  Skin & Axial Inspection: No masses, redness, edema, swelling, or associated skin lesions Alignment: Symmetrical Functional ROM: Unrestricted ROM      Stability: No instability detected Muscle Tone/Strength: Functionally intact. No obvious neuro-muscular anomalies detected. Sensory (Neurological): Unimpaired Palpation: No palpable anomalies              Upper Extremity (UE) Exam    Side: Right upper extremity  Side: Left upper extremity  Skin & Extremity Inspection: Skin color, temperature, and hair growth are WNL. No peripheral edema or cyanosis. No masses, redness, swelling, asymmetry, or associated skin lesions. No contractures.  Skin & Extremity Inspection: Skin color, temperature, and hair growth are WNL. No peripheral edema or cyanosis. No masses, redness, swelling, asymmetry, or associated skin lesions. No contractures.  Functional ROM: Unrestricted ROM          Functional ROM: Unrestricted ROM          Muscle Tone/Strength: Functionally intact. No obvious neuro-muscular anomalies detected.  Muscle Tone/Strength: Functionally intact. No obvious neuro-muscular anomalies detected.  Sensory (Neurological): Unimpaired          Sensory (Neurological): Unimpaired          Palpation: No palpable anomalies              Palpation: No palpable anomalies  Provocative Test(s):  Phalen's test: deferred Tinel's test: deferred Apley's scratch test (touch opposite shoulder):  Action 1 (Across chest): deferred Action 2 (Overhead): deferred Action 3 (LB reach): deferred   Provocative Test(s):  Phalen's test: deferred Tinel's test: deferred Apley's scratch test (touch opposite shoulder):  Action 1 (Across chest): deferred Action 2 (Overhead): deferred Action 3 (LB reach): deferred    Thoracic Spine Area Exam  Skin & Axial Inspection: No masses, redness, or swelling Alignment: Symmetrical Functional ROM: Unrestricted ROM Stability: No instability detected Muscle Tone/Strength: Functionally intact. No obvious neuro-muscular anomalies detected. Sensory (Neurological): Unimpaired Muscle strength & Tone: No palpable anomalies  Lumbar Spine Area Exam  Skin & Axial Inspection: No masses, redness, or swelling Alignment: Symmetrical Functional ROM: Unrestricted ROM       Stability: No instability detected Muscle Tone/Strength: Functionally intact. No obvious neuro-muscular anomalies detected. Sensory (Neurological): Unimpaired Palpation: No palpable anomalies       Provocative Tests: Lumbar Hyperextension/rotation test: deferred today       Lumbar quadrant test (Kemp's test): deferred today       Lumbar Lateral bending test: deferred today       Patrick's Maneuver: deferred today                   FABER test: deferred today       Thigh-thrust test: deferred today       S-I compression test: deferred today       S-I distraction test: deferred today        Gait & Posture Assessment  Ambulation: Unassisted Gait: Relatively normal for age and body habitus Posture: WNL   Lower Extremity Exam    Side: Right lower extremity  Side: Left lower extremity  Stability: No instability observed          Stability: No instability observed          Skin & Extremity Inspection: Skin color, temperature, and hair growth are WNL. No peripheral  edema or cyanosis. No masses, redness, swelling, asymmetry, or associated skin lesions. No contractures.  Skin & Extremity Inspection: Skin color, temperature, and hair growth are WNL. No peripheral edema or cyanosis. No masses, redness, swelling, asymmetry, or associated skin lesions. No contractures.  Functional ROM: Unrestricted ROM                  Functional ROM: Unrestricted ROM                  Muscle Tone/Strength: Functionally intact. No obvious neuro-muscular anomalies detected.  Muscle Tone/Strength: Functionally intact. No obvious neuro-muscular anomalies detected.  Sensory (Neurological): Unimpaired  Sensory (Neurological): Unimpaired  Palpation: No palpable anomalies  Palpation: No palpable anomalies   Assessment  Primary Diagnosis & Pertinent Problem List: The primary encounter diagnosis was Chronic low back pain (Primary Source of Pain) (Left). Diagnoses of Chronic lower extremity pain (Secondary source of pain) (Left), Lumbar facet syndrome (Bilateral) (L>R), Chronic sacroiliac joint pain (Bilateral) (L>R), and Failed back surgical syndrome (x 3) were also pertinent to this visit.  Status Diagnosis  Controlled Controlled Controlled 1. Chronic low back pain (Primary Source of Pain) (Left)   2. Chronic lower extremity pain (Secondary source of pain) (Left)   3. Lumbar facet syndrome (Bilateral) (L>R)   4. Chronic sacroiliac joint pain (Bilateral) (L>R)   5. Failed back surgical syndrome (x 3)     Problems updated and reviewed during this visit: No problems updated. Plan of Care  Pharmacotherapy (Medications Ordered): No orders  of the defined types were placed in this encounter.  Medications administered today: Keyonte Yannotti had no medications administered during this visit.  Procedure Orders    No procedure(s) ordered today   Lab Orders  No laboratory test(s) ordered today   Imaging Orders  No imaging studies ordered today   Referral Orders  No referral(s)  requested today   Interventional management options: Planned, scheduled, and/or pending:   Diagnostic left sided lumbar facet block #2 under fluoroscopic guidance and IV sedation    Considering:   Diagnostic/therapeutic Left L2-3 translaminar LESI#2+ left L2-3 TFESI #2 Diagnostic left caudal epidural steroid injection  Palliative RACZepidurolysis of adhesions #2    Palliative PRN treatment(s):   None at this time   Provider-requested follow-up: No follow-ups on file.  Future Appointments  Date Time Provider Department Center  06/02/2018  9:15 AM Delano Metz, MD [facility] None   Primary Care Physician: Glori Luis, MD Location: [facility] Note by: Oswaldo Done, MD Date: 06/02/2018; Time: 7:21 AM

## 2018-07-07 NOTE — Progress Notes (Deleted)
Subjective:    Patient ID: Maria Hamilton, female    DOB: 1951-01-28, 67 y.o.   MRN: [id number]  HPI  Maria Hamilton is a 67 year old female who presents today with dizziness.  Date & time symptoms started:  Symptoms were :    lightheadedness     sensation of room spinning    sensation of near fainting  Length of time symptoms lasted in seconds , minutes or hours:   Presyncope/syncope associated: No Evaluation of provoking factors that can cause symptoms below: Change in head position: No Ear pressure: No Coughing/sneezing: No Valsalva: No Occur with standing/getting out of bed: No Lying down/rolling over in bed/bending neck: No Hearing loss: No Tinnitus: Visual changes: History of injury/trauma: Seizure activity:    Denies N/V, HA, SOB, change in vision, numbness, weakness, abnormal coordination/gait, significant anxiety/pain/depression History includes HTN, RA, Hypokalemia She noted previously that she did not wish to see nephrology and PCP advised her that there was not a reason for hypokalemia and if she would not be evaluated she could potentially be placed on long-term potassium supplements. She did agree to a referral to nephrology after latest potassium check.    Lab Results  Component Value Date   K 3.1 (L) 04/30/2018     Medications, multiple chronic conditions and medications, anxiety/depression, CV disease,   Review of Systems     Objective:   Physical Exam        Assessment & Plan:

## 2018-07-08 ENCOUNTER — Ambulatory Visit: Payer: Medicare HMO | Admitting: Family Medicine

## 2018-07-09 NOTE — Progress Notes (Signed)
Subjective:    Patient ID: Maria Hamilton, female    DOB: 05-24-1951, 67 y.o.   MRN: [id number]  HPI  Ms. Ralph is a 67 year old female who presents today with dizziness that has been present for  one month. She reports experiencing this in the morning and notes this with movement. Dizziness last approximately 15 minutes and can resolve with rest. She describes as feeling as if she is spinning. This can be noticed with bending over and getting up from a lying position.  Her BP is elevated at the first of the visit; she reports not taking her BP medication today. She has historically at night but is trying to switch to the morning.   Presyncope/syncope associated: No Evaluation of provoking factors that can cause symptoms below: Change in head position: Yes Ear pressure: No Coughing/sneezing: No Valsalva: No Occur with standing/getting out of bed: Yes Lying down/rolling over in bed/bending neck: Can occur Hearing loss: No N/V: No Vision changes: No Chest pain: No SOB: No N/V: No  Prior history of vertigo on 03/25/17 where she was prescribed meclizine. She reported benefit with meclizine noting it "cleared it up"  Denies N/V, HA, change in vision, numbness, weakness, abnormal coordination/gait Endorses that she feels "wobbly" History of cardiac dysfunction: No per patient; history of HTN  She was seen 04/02/18 for evaluation which noted pain in her shoulder bilaterally. Following work up polymyalgia rheumatica was considered and prednisone with rheumatology referral was provided to her. She also has a history of hypokalemia, she was advised in 03/2018 that a referral to nephrology could be placed as there was not a clear etiology of hypokalemia. She did not want to go to nephrology. She wanted to have long term potassium supplements.  She is due for a follow up with PCP however she has not scheduled this at this time. She states that she is due and will make an appointment  soon.   Review of Systems  Constitutional: Negative for chills, fatigue and fever.  HENT: Negative for ear pain.   Eyes: Negative for visual disturbance.  Respiratory: Negative for cough, shortness of breath and wheezing.   Cardiovascular: Negative for chest pain and palpitations.  Gastrointestinal: Negative for abdominal pain, diarrhea, nausea and vomiting.  Genitourinary: Negative for dysuria.  Musculoskeletal: Negative for myalgias.  Skin: Negative for rash.  Neurological: Positive for dizziness. Negative for weakness, light-headedness, numbness and headaches.   Past Medical History:  Diagnosis Date  . Acute anxiety 08/03/2015   Overview:  Last Assessment & Plan:  Klonopin most helpful  Reports past taking it 1 tab am 1/2 tab midday and 1 tab pm  30 day supply faxed to pharmacy  . Acute bronchitis 12/31/2016  . Acute pancreatitis 10/14/2016  . Altered mental status 10/12/2016  . Anxiety   . Bronchitis   . Chronic hip pain   . Chronic pain syndrome   . Depression   . Edema   . Fatigue   . Fibromyalgia   . GERD (gastroesophageal reflux disease)   . Headache   . Hypertension   . Insomnia   . Low back pain   . Right upper quadrant abdominal pain   . Vitamin D deficiency      Social History   Socioeconomic History  . Marital status: Married    Spouse name: Not on file  . Number of children: Not on file  . Years of education: Not on file  . Highest education level: Not on file  Occupational History  . Not on file  Social Needs  . Financial resource strain: Not on file  . Food insecurity:    Worry: Not on file    Inability: Not on file  . Transportation needs:    Medical: Not on file    Non-medical: Not on file  Tobacco Use  . Smoking status: Never Smoker  . Smokeless tobacco: Never Used  Substance and Sexual Activity  . Alcohol use: No    Alcohol/week: 0.0 oz  . Drug use: No  . Sexual activity: Yes  Lifestyle  . Physical activity:    Days per week: Not on  file    Minutes per session: Not on file  . Stress: Not on file  Relationships  . Social connections:    Talks on phone: Not on file    Gets together: Not on file    Attends religious service: Not on file    Active member of club or organization: Not on file    Attends meetings of clubs or organizations: Not on file    Relationship status: Not on file  . Intimate partner violence:    Fear of current or ex partner: Not on file    Emotionally abused: Not on file    Physically abused: Not on file    Forced sexual activity: Not on file  Other Topics Concern  . Not on file  Social History Narrative  . Not on file    Past Surgical History:  Procedure Laterality Date  . ABDOMINAL HYSTERECTOMY    . BACK SURGERY    . BREAST BIOPSY Left 90s   benign  . BREAST SURGERY    . CHOLECYSTECTOMY N/A 10/16/2016   Procedure: LAPAROSCOPIC CHOLECYSTECTOMY WITH INTRAOPERATIVE CHOLANGIOGRAM;  Surgeon: Tiney Rouge III, MD;  Location: [facility] ORS;  Service: General;  Laterality: N/A;  . cyst removal from wrist    . ECTOPIC PREGNANCY SURGERY    . HIP SURGERY     x4  . OOPHORECTOMY      Family History  Problem Relation Age of Onset  . Heart disease Mother   . Cancer Father   . Alcohol abuse Father     Allergies  Allergen Reactions  . Ace Inhibitors Swelling    Angioedema Angioedema Angioedema  . Ciprofloxacin Itching and Rash  . Fentanyl Itching  . Latex   . Morphine And Related Itching  . Penicillin G Benzathine   . Isovue M [Iopamidol] Itching    Pt was given of Isovue 370 for CT Scan today @ 11:10am. After injection she started itching all over. We gave her 50mG  of Benadryl IV.   . Tape Rash    Current Outpatient Medications on File Prior to Visit  Medication Sig Dispense Refill  . acetaminophen (TYLENOL) 500 MG tablet Take 1,000 mg by mouth at bedtime.    Marland Kitchen amLODipine (NORVASC) 10 MG tablet Take 1 tablet (10 mg total) by mouth daily. 90 tablet 3  . calcium carbonate (OSCAL)  1500 (600 Ca) MG TABS tablet Take 600 mg of elemental calcium by mouth daily with breakfast.    . cholecalciferol (VITAMIN D) 1000 units tablet Take 1,000 Units by mouth daily.    Marland Kitchen escitalopram (LEXAPRO) 10 MG tablet Take 1 tablet (10 mg total) by mouth daily. 90 tablet 1  . gabapentin (NEURONTIN) 600 MG tablet Take 1 tablet (600 mg total) by mouth at bedtime. 120 tablet 0  . potassium chloride SA (K-DUR,KLOR-CON) 20 MEQ tablet Take 1  tablet (20 mEq total) by mouth daily. 180 tablet 0  . pramipexole (MIRAPEX) 0.5 MG tablet Take 1 tablet (0.5 mg total) by mouth daily. 30 tablet 2  . pregabalin (LYRICA) 75 MG capsule Take 75 mg by mouth 2 (two) times daily.    . tizanidine (ZANAFLEX) 2 MG capsule Take 1 capsule (2 mg total) by mouth 3 (three) times daily as needed for muscle spasms. 90 capsule 5   No current facility-administered medications on file prior to visit.     BP (!) 158/90 (BP Location: Left Arm, Patient Position: Standing, Cuff Size: Normal)   Pulse 71   Temp 98.2 F (36.8 C) (Oral)   Resp 16   Wt 206 lb 12 oz (93.8 kg)   BMI 27.28 kg/m       Objective:   Physical Exam  Constitutional: She is oriented to person, place, and time. She appears well-developed and well-nourished.  Eyes: Pupils are equal, round, and reactive to light. No scleral icterus.  Neck: Neck supple.  Cardiovascular: Normal rate, normal heart sounds and intact distal pulses.  Pulmonary/Chest: Effort normal and breath sounds normal. She has no wheezes. She has no rales.  Musculoskeletal: She exhibits no edema.  Lymphadenopathy:    She has no cervical adenopathy.  Neurological: She is alert and oriented to person, place, and time.  Unable to complete cerebellar testing: finger to nose, heel to shin. Due to dizziness that is increasingly worsening during exam.  Unable to complete Romberg and Dix-Hallpike as patient is not able to do so due to dizziness she is experiencing.   II-Visual fields grossly  intact. III/IV/VI-Extraocular movements intact. Pupils reactive bilaterally. V/VII-Smile symmetric, equal eyebrow raise, facial sensation intact VIII- Hearing grossly intact XI-bilateral shoulder shrug Motor: 5/5 bilaterally arms/legs with normal tone and bulk   Skin: Skin is warm and dry. No rash noted.      Assessment & Plan:  1. Dizziness BP elevated during exam, recheck noted a decrease of BP 148/90 L arm. With limitations in exam that patient could or willing to tolerate it is challenging to determine source of dizziness. History of vertigo present which is most likely however patient states she is not tolerating exam today.  During the exam, we discussed options of an urgent ENT evaluation to determine if dizziness is related to vertigo, we also discussed imaging that could be obtained to rule out central cause, and final option of ED evaluation. While waiting to obtain a stat referral to ENT, patient experienced increased dizziness and was having difficulty with ambulation nearly sliding out of her chair to the floor. She was accompanied by her sister and with increasing symptoms and unclear etiology of symptoms; recommended ED evaluation. She was offered transportation via ambulance, she declined this option. She agreed to seek care in the ED and her sister was going to to drive her. Strongly advised that she seek further evaluation and the importance of having a work up to determine source of dizziness. She voiced understanding and agreed with plan.  - Ambulatory referral to ENT  2. Essential hypertension BP elevated at visit today; patient did not take her medications today. Retake noted decrease in BP.  Follow up with PCP advised however priority today is for evaluation in the ED as stated above.   Roddie Mc, FNP-C

## 2018-07-10 ENCOUNTER — Emergency Department
Admission: EM | Admit: 2018-07-10 | Discharge: 2018-07-10 | Disposition: A | Discharge status: Home / Self Care | Payer: Medicare HMO | Attending: Emergency Medicine | Admitting: Emergency Medicine

## 2018-07-10 ENCOUNTER — Other Ambulatory Visit: Payer: Self-pay

## 2018-07-10 ENCOUNTER — Ambulatory Visit (INDEPENDENT_AMBULATORY_CARE_PROVIDER_SITE_OTHER): Payer: Medicare HMO | Admitting: Family Medicine

## 2018-07-10 ENCOUNTER — Encounter: Payer: Self-pay | Admitting: Family Medicine

## 2018-07-10 ENCOUNTER — Emergency Department: Payer: Medicare HMO

## 2018-07-10 VITALS — BP 158/90 | HR 71 | Temp 98.2°F | Resp 16 | Wt 206.8 lb

## 2018-07-10 DIAGNOSIS — Z79899 Other long term (current) drug therapy: Secondary | ICD-10-CM | POA: Insufficient documentation

## 2018-07-10 DIAGNOSIS — I10 Essential (primary) hypertension: Secondary | ICD-10-CM | POA: Insufficient documentation

## 2018-07-10 DIAGNOSIS — Z91040 Latex allergy status: Secondary | ICD-10-CM | POA: Insufficient documentation

## 2018-07-10 DIAGNOSIS — R42 Dizziness and giddiness: Secondary | ICD-10-CM

## 2018-07-10 LAB — CBC
HEMATOCRIT: 38.1 % (ref 35.0–47.0)
HEMOGLOBIN: 12.7 g/dL (ref 12.0–16.0)
MCH: 27.1 pg (ref 26.0–34.0)
MCHC: 33.4 g/dL (ref 32.0–36.0)
MCV: 81 fL (ref 80.0–100.0)
Platelets: 296 10*3/uL (ref 150–440)
RBC: 4.7 MIL/uL (ref 3.80–5.20)
RDW: 15 % — AB (ref 11.5–14.5)
WBC: 5.9 10*3/uL (ref 3.6–11.0)

## 2018-07-10 LAB — URINALYSIS, COMPLETE (UACMP) WITH MICROSCOPIC
BACTERIA UA: NONE SEEN
BILIRUBIN URINE: NEGATIVE
Glucose, UA: NEGATIVE mg/dL
HGB URINE DIPSTICK: NEGATIVE
KETONES UR: NEGATIVE mg/dL
LEUKOCYTES UA: NEGATIVE
NITRITE: NEGATIVE
Protein, ur: NEGATIVE mg/dL
Specific Gravity, Urine: 1.005 (ref 1.005–1.030)
pH: 6 (ref 5.0–8.0)

## 2018-07-10 LAB — BASIC METABOLIC PANEL
ANION GAP: 8 (ref 5–15)
BUN: 8 mg/dL (ref 8–23)
CHLORIDE: 102 mmol/L (ref 98–111)
CO2: 29 mmol/L (ref 22–32)
Calcium: 9.3 mg/dL (ref 8.9–10.3)
Creatinine, Ser: 0.7 mg/dL (ref 0.44–1.00)
Glucose, Bld: 99 mg/dL (ref 70–99)
Potassium: 3.2 mmol/L — ABNORMAL LOW (ref 3.5–5.1)
Sodium: 139 mmol/L (ref 135–145)

## 2018-07-10 MED ORDER — MECLIZINE HCL 25 MG PO TABS: 25.0000 mg | ORAL_TABLET | Freq: Three times a day (TID) | ORAL | 0 refills | Status: DC | PRN

## 2018-07-10 MED ORDER — MECLIZINE HCL 25 MG PO TABS
25.0000 mg | ORAL_TABLET | Freq: Once | ORAL | Status: AC
  Administered 2018-07-10: 25 mg via ORAL
  Filled 2018-07-10: qty 1

## 2018-07-10 NOTE — ED Triage Notes (Signed)
Pt c/o dizziness x 1 month. Went to PCP today for it. Has had vertigo "long time ago" alert, oriented, speaking in complete clear sentences. In wheelchair. Feels unsteady, denies room spinning.

## 2018-07-10 NOTE — ED Notes (Signed)
Pt was able to walk without assistance. No dizziness when walking. States she feels better.

## 2018-07-10 NOTE — Patient Instructions (Signed)
A referral to an ear, nose, and throat provider has been provided today. It is important to be evaluated to determine source of dizziness.  Please consider seeking care today with ear, nose, and throat today. Other options that we discussed are imaging your head to determine source.  Advise that you seek care immediately for worsening symptoms which includes seeking care in the emergency department.  You are due for a follow up with Dr. Caryl Bis. Please schedule this appointment after dizziness has been evaluated.  Dizziness Dizziness is a common problem. It makes you feel unsteady or light-headed. You may feel like you are about to pass out (faint). Dizziness can lead to getting hurt if you stumble or fall. Dizziness can be caused by many things, including:  Medicines.  Not having enough water in your body (dehydration).  Illness.  Follow these instructions at home: Eating and drinking  Drink enough fluid to keep your pee (urine) clear or pale yellow. This helps to keep you from getting dehydrated. Try to drink more clear fluids, such as water.  Do not drink alcohol.  Limit how much caffeine you drink or eat, if your doctor tells you to do that.  Limit how much salt (sodium) you drink or eat, if your doctor tells you to do that. Activity  Avoid making quick movements. ? When you stand up from sitting in a chair, steady yourself until you feel okay. ? In the morning, first sit up on the side of the bed. When you feel okay, stand slowly while you hold onto something. Do this until you know that your balance is fine.  If you need to stand in one place for a long time, move your legs often. Tighten and relax the muscles in your legs while you are standing.  Do not drive or use heavy machinery if you feel dizzy.  Avoid bending down if you feel dizzy. Place items in your home so you can reach them easily without leaning over. Lifestyle  Do not use any products that contain  nicotine or tobacco, such as cigarettes and e-cigarettes. If you need help quitting, ask your doctor.  Try to lower your stress level. You can do this by using methods such as yoga or meditation. Talk with your doctor if you need help. General instructions  Watch your dizziness for any changes.  Take over-the-counter and prescription medicines only as told by your doctor. Talk with your doctor if you think that you are dizzy because of a medicine that you are taking.  Tell a friend or a family member that you are feeling dizzy. If he or she notices any changes in your behavior, have this person call your doctor.  Keep all follow-up visits as told by your doctor. This is important. Contact a doctor if:  Your dizziness does not go away.  Your dizziness or light-headedness gets worse.  You feel sick to your stomach (nauseous).  You have trouble hearing.  You have new symptoms.  You are unsteady on your feet.  You feel like the room is spinning. Get help right away if:  You throw up (vomit) or have watery poop (diarrhea), and you cannot eat or drink anything.  You have trouble: ? Talking. ? Walking. ? Swallowing. ? Using your arms, hands, or legs.  You feel generally weak.  You are not thinking clearly, or you have trouble forming sentences. A friend or family member may notice this.  You have: ? Chest pain. ? Pain in  your belly (abdomen). ? Shortness of breath. ? Sweating.  Your vision changes.  You are bleeding.  You have a very bad headache.  You have neck pain or a stiff neck.  You have a fever. These symptoms may be an emergency. Do not wait to see if the symptoms will go away. Get medical help right away. Call your local emergency services (911 in the [place] Do not drive yourself to the hospital. Summary  Dizziness makes you feel unsteady or light-headed. You may feel like you are about to pass out (faint).  Drink enough fluid to keep your pee (urine)  clear or pale yellow. Do not drink alcohol.  Avoid making quick movements if you feel dizzy.  Watch your dizziness for any changes. This information is not intended to replace advice given to you by your health care provider. Make sure you discuss any questions you have with your health care provider. Document Released: 11/22/2011 Document Revised: 12/20/2016 Document Reviewed: 12/20/2016 Elsevier Interactive Patient Education  2017 [employer].

## 2018-07-10 NOTE — ED Notes (Signed)
A person from the lobby came up to my desk and said that he wanted to tell someone that earlier today, when this pt was acting as though she was going to pass out, she was actually rocking the wheelchair back and forth, trying to turn it over. That's how the commotion started and he just felt that someone should know.

## 2018-07-10 NOTE — ED Notes (Addendum)
C/O worsening dizziness.  Patient rocking back and forth in wheelchair.  Almost falling out of chair.  Patient is emotional and anxious.  MAE equally and strong. Speech clear.  NAD.    Patient states "I can just be dizzy at home".

## 2018-07-10 NOTE — ED Provider Notes (Addendum)
[facility] Emergency Department Provider Note  ____________________________________________   I have reviewed the triage vital signs and the nursing notes. Where available I have reviewed prior notes and, if possible and indicated, outside hospital notes.    HISTORY  Chief Complaint Dizziness    HPI Maria Hamilton is a 67 y.o. female who presents today complaining of vertigo.  Patient has true spinning sensation is been going on for 1 month.  Worse when she lies flat, better when she walks around no exertional symptoms no chest pain no shortness of breath no nausea no vomiting no focal numbness no weakness no closed head injury, began gradually a month ago and persist.  Patient very anxious about it she states.  Patient states that she does have a long history of in her urine has had other inner ear episodes of true vertigo in the past.  It is a spinning sensation.  She does not have any ear pain or hearing loss.  She has not taken anything for it.  Nothing else makes it better or worse.  No other associated symptoms.  Certainly no focal numbness or weakness or dysmetria etc.    Past Medical History:  Diagnosis Date  . Acute anxiety 08/03/2015   Overview:  Last Assessment & Plan:  Klonopin most helpful  Reports past taking it 1 tab am 1/2 tab midday and 1 tab pm  30 day supply faxed to pharmacy  . Acute bronchitis 12/31/2016  . Acute pancreatitis 10/14/2016  . Altered mental status 10/12/2016  . Anxiety   . Bronchitis   . Chronic hip pain   . Chronic pain syndrome   . Depression   . Edema   . Fatigue   . Fibromyalgia   . GERD (gastroesophageal reflux disease)   . Headache   . Hypertension   . Insomnia   . Low back pain   . Right upper quadrant abdominal pain   . Vitamin D deficiency     Patient Active Problem List   Diagnosis Date Noted  . Spondylosis without myelopathy or radiculopathy, lumbosacral region 04/28/2018  . Chronic hip pain after  total replacement of hip joint (Left) 04/28/2018  . Chronic hip pain after total replacement of hip joint (Right) 04/28/2018  . Chronic pain of hip  (Bilateral) (L>R) 04/28/2018  . Chronic sacroiliac joint pain (Bilateral) (L>R) 04/28/2018  . Lumbar facet syndrome (Bilateral) (L>R) 04/28/2018  . Vitamin B 12 deficiency 04/02/2018  . Pain of both shoulder joints 04/02/2018  . Degeneration of lumbar intervertebral disc 10/30/2017  . Osteoarthritis of hip 10/30/2017  . History of allergy to radiographic contrast media 10/29/2017  . Ankle pain 10/16/2017  . Anxiety and depression 10/16/2017  . Viral URI with cough 10/16/2017  . RLS (restless legs syndrome) 10/10/2017  . Night sweats 08/13/2017  . Supraclavicular fossa fullness 08/13/2017  . Chronic foot numbness (Left) 04/02/2017  . Vertigo 03/25/2017  . 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) 03/20/2017  . History of lumbar fusion (L3-4, L4-5, and L5-S1) 03/20/2017  . Post herpetic neuralgia 03/12/2017  . Abnormal MRI, lumbar spine (03/06/2017) 03/12/2017  . Memory difficulty 03/06/2017  . Polyneuropathy 03/06/2017  . Palpitations 11/26/2016  . Epigastric pain 11/26/2016  . Therapeutic opioid-induced constipation (OIC) 11/20/2016  . Constipation   . Hypokalemia 09/27/2016  . Long term current use of opiate analgesic 09/24/2016  . Long term prescription opiate use 09/24/2016  . Opiate use 09/24/2016  . Encounter for therapeutic drug level monitoring  09/24/2016  . Encounter for pain management planning 09/24/2016  . Rheumatoid arthritis, multiple sites (positive RF) 09/24/2016  . Chronic lower extremity pain (Secondary source of pain) (Left) 09/24/2016  . Lumbar facet arthropathy (Bilateral) 09/24/2016  . Failed back surgical syndrome (x 3) 09/24/2016  . Epidural fibrosis 09/24/2016  . Neurogenic pain 09/24/2016  . Musculoskeletal pain 09/24/2016  . Chronic lumbar radicular pain (L5/S1 dermatome) (Left)  09/24/2016  . History of total hip replacement, bilateral 09/24/2016  . GERD (gastroesophageal reflux disease) 08/03/2015  . Rheumatoid factor positive 08/03/2015  . Chronic fatigue 08/03/2015  . Vitamin D deficiency 08/03/2015  . Insomnia 08/03/2015  . Fibromyalgia 08/03/2015  . Chronic low back pain (Primary Source of Pain) (Left) 08/03/2015  . Chronic pain syndrome 08/03/2015  . Depression 08/03/2015  . Hypertension 08/03/2015  . Acute anxiety 08/03/2015  . Mechanical complication of internal joint prosthesis ([place]) 04/22/2012    Past Surgical History:  Procedure Laterality Date  . ABDOMINAL HYSTERECTOMY    . BACK SURGERY    . BREAST BIOPSY Left 90s   benign  . BREAST SURGERY    . CHOLECYSTECTOMY N/A 10/16/2016   Procedure: LAPAROSCOPIC CHOLECYSTECTOMY WITH INTRAOPERATIVE CHOLANGIOGRAM;  Surgeon: Dia Crawford III, MD;  Location: [facility] ORS;  Service: General;  Laterality: N/A;  . cyst removal from wrist    . ECTOPIC PREGNANCY SURGERY    . HIP SURGERY     x4  . OOPHORECTOMY      Prior to Admission medications   Medication Sig Start Date End Date Taking? Authorizing Provider  acetaminophen (TYLENOL) 500 MG tablet Take 1,000 mg by mouth at bedtime.    [provider]  amLODipine (NORVASC) 10 MG tablet Take 1 tablet (10 mg total) by mouth daily. 01/04/18   Leone Haven, MD  calcium carbonate (OSCAL) 1500 (600 Ca) MG TABS tablet Take 600 mg of elemental calcium by mouth daily with breakfast.    [provider]  cholecalciferol (VITAMIN D) 1000 units tablet Take 1,000 Units by mouth daily.    [provider]  escitalopram (LEXAPRO) 10 MG tablet Take 1 tablet (10 mg total) by mouth daily. 04/02/18   Leone Haven, MD  gabapentin (NEURONTIN) 600 MG tablet Take 1 tablet (600 mg total) by mouth at bedtime. 02/17/18   Vevelyn Francois, NP  potassium chloride SA (K-DUR,KLOR-CON) 20 MEQ tablet Take 1 tablet (20 mEq total) by mouth daily. 04/09/18   Leone Haven, MD  pramipexole (MIRAPEX) 0.5 MG tablet Take 1 tablet (0.5 mg total) by mouth daily. 03/10/18   Vevelyn Francois, NP  pregabalin (LYRICA) 75 MG capsule Take 75 mg by mouth 2 (two) times daily.    [provider]  tizanidine (ZANAFLEX) 2 MG capsule Take 1 capsule (2 mg total) by mouth 3 (three) times daily as needed for muscle spasms. 03/10/18 09/06/18  Vevelyn Francois, NP    Allergies Ace inhibitors; Ciprofloxacin; Fentanyl; Latex; Morphine and related; Penicillin g benzathine; Isovue m [iopamidol]; and Tape  Family History  Problem Relation Age of Onset  . Heart disease Mother   . Cancer Father   . Alcohol abuse Father     Social History Social History   Tobacco Use  . Smoking status: Never Smoker  . Smokeless tobacco: Never Used  Substance Use Topics  . Alcohol use: No    Alcohol/week: 0.0 oz  . Drug use: No    Review of Systems Constitutional: No fever/chills Eyes: No visual changes. ENT:  No sore throat. No stiff neck no neck pain Cardiovascular: Denies chest pain. Respiratory: Denies shortness of breath. Gastrointestinal:   no vomiting.  No diarrhea.  No constipation. Genitourinary: Negative for dysuria. Musculoskeletal: Negative lower extremity swelling Skin: Negative for rash. Neurological: Negative for severe headaches, focal weakness or numbness.   ____________________________________________   PHYSICAL EXAM:  VITAL SIGNS: ED Triage Vitals  Enc Vitals Group     BP 07/10/18 1315 (!) 153/68     Pulse Rate 07/10/18 1315 61     Resp 07/10/18 1315 16     Temp 07/10/18 1315 97.7 F (36.5 C)     Temp Source 07/10/18 1315 Oral     SpO2 07/10/18 1315 98 %     Weight 07/10/18 1312 206 lb (93.4 kg)     Height 07/10/18 1312 6\' 1"  (1.854 m)     Head Circumference --      Peak Flow --      Pain Score 07/10/18 1312 0     Pain Loc --      Pain Edu? --      Excl. in [place]? --     Constitutional: Alert and oriented. Well appearing and in no acute  distress. Eyes: Conjunctivae are normal Head: Atraumatic HEENT: No congestion/rhinnorhea. Mucous membranes are moist.  Oropharynx non-erythematous Neck:   Nontender with no meningismus, no masses, no stridor Cardiovascular: Normal rate, regular rhythm. Grossly normal heart sounds.  Good peripheral circulation. Respiratory: Normal respiratory effort.  No retractions. Lungs CTAB. Abdominal: Soft and nontender. No distention. No guarding no rebound Back:  There is no focal tenderness or step off.  there is no midline tenderness there are no lesions noted. there is no CVA tenderness Musculoskeletal: No lower extremity tenderness, no upper extremity tenderness. No joint effusions, no DVT signs strong distal pulses no edema Neurologic: Cranial nerves II through XII are grossly intact 5 out of 5 strength bilateral upper and lower extremity. Finger to nose within normal limits heel to shin within normal limits, speech is normal with no word finding difficulty or dysarthria, reflexes symmetric, pupils are equally round and reactive to light, there is no pronator drift, sensation is normal, vision is intact to confrontation, gait is deferred, there is positive fatigable nystagmus,  Skin:  Skin is warm, dry and intact. No rash noted. Psychiatric: Mood and affect are normal. Speech and behavior are normal.  ____________________________________________   LABS (all labs ordered are listed, but only abnormal results are displayed)  Labs Reviewed  BASIC METABOLIC PANEL - Abnormal; Notable for the following components:      Result Value   Potassium 3.2 (*)    All other components within normal limits  CBC - Abnormal; Notable for the following components:   RDW 15.0 (*)    All other components within normal limits  URINALYSIS, COMPLETE (UACMP) WITH MICROSCOPIC    Pertinent labs  results that were available during my care of the patient were reviewed by me and considered in my medical decision making (see  chart for details). ____________________________________________  EKG  I personally interpreted any EKGs ordered by me or triage Sinus rhythm, rate 60 bpm no acute ST elevation or depression, unremarkable EKG ____________________________________________  RADIOLOGY  Pertinent labs & imaging results that were available during my care of the patient were reviewed by me and considered in my medical decision making (see chart for details). If possible, patient and/or family made aware of any abnormal findings.  No results found. ____________________________________________  PROCEDURES  Procedure(s) performed: None  Procedures  Critical Care performed: None  ____________________________________________   INITIAL IMPRESSION / ASSESSMENT AND PLAN / ED COURSE  Pertinent labs & imaging results that were available during my care of the patient were reviewed by me and considered in my medical decision making (see chart for details).  With a long history of true vertigo presents with true vertigo, no head injury neurologically intact with no cerebellar signs, given her age I will obtain a CT scan although very low suspicion for centrally mediated process.  Somewhat limited view of TMs due to torturous external auditory canal but no obvious lesions noted, patient has a long and recurrent history of this according to her, we will try Antivert which I hope will help her we will obtain a CT and get basic blood work and reassess  ----------------------------------------- 5:00 PM on 07/10/2018 -----------------------------------------  Patient absolutely asymptomatic at this time after Antivert CT is reassuring, neurologic exam remains normal, will ambulate her if she still feels fine I do not see any reason to admit her this is most likely peripheral as it is recurrent, fatigable, and amenable to oral medications, also been going on for a  month,  ----------------------------------------- 6:05 PM on 07/10/2018 -----------------------------------------  Negative Romberg normal gait will discharge    ____________________________________________   FINAL CLINICAL IMPRESSION(S) / ED DIAGNOSES  Final diagnoses:  None      This chart was dictated using voice recognition software.  Despite best efforts to proofread,  errors can occur which can change meaning.      Schuyler Amor, MD 07/10/18 1542    Schuyler Amor, MD 07/10/18 1701    Schuyler Amor, MD 07/10/18 1806

## 2018-07-10 NOTE — Discharge Instructions (Signed)
Closely with primary care and neurology.  If you have any significant worsening including persistent vomiting, numbness, weakness, inability to walk, significant dizziness or you feel worse in any way return to the emergency room.

## 2018-07-15 ENCOUNTER — Telehealth: Payer: Self-pay | Admitting: Family Medicine

## 2018-07-15 DIAGNOSIS — R42 Dizziness and giddiness: Secondary | ICD-10-CM

## 2018-07-15 NOTE — Telephone Encounter (Signed)
Copied from [place] [id number]. Topic: Quick Communication - See Telephone Encounter >> Jul 15, 2018 10:14 AM Neva Seat wrote: Pt came into the office on Thurs 7-25 and also went to emergency room on Thurs-  pt is still dizzy.  Pt wants to know what else can be done to help her asap.

## 2018-07-15 NOTE — Telephone Encounter (Signed)
It looks like they prescribed meclizine for her.  She could take this as long as it does not make her drowsy.  We could refer to ENT though that would take some time.  We could also refer for vestibular rehab though that would take some time as well.

## 2018-07-15 NOTE — Telephone Encounter (Signed)
Please advise 

## 2018-07-15 NOTE — Telephone Encounter (Signed)
Spoke to patients husband and informed him of message below. He states he will have patient call our office about which referral she would prefer.ok for pec to speak to patient

## 2018-07-17 NOTE — Telephone Encounter (Signed)
Please advise 

## 2018-07-17 NOTE — Telephone Encounter (Signed)
Copied from [place] [id number]. Topic: Quick Communication - See Telephone Encounter >> Jul 17, 2018  4:14 PM Keene Breath wrote: Patient called again and said she does not want a referral to an ENT or any rehab.  She wants another type of medication for the dizziness.  Please advise.  CB# [phone].

## 2018-07-21 NOTE — Telephone Encounter (Signed)
At this time she really needs to do the vestibular rehab or see ENT to help determine a cause.  Please find out if the meclizine has been beneficial.  Thanks.

## 2018-07-22 NOTE — Telephone Encounter (Signed)
tried calling, no voicemail. [place] for pec to speak to patient and offer appointment in the office

## 2018-07-22 NOTE — Telephone Encounter (Signed)
Left message to return call, ok for pec to speak to patient and inform her of message below

## 2018-07-22 NOTE — Telephone Encounter (Signed)
Please advise 

## 2018-07-22 NOTE — Telephone Encounter (Signed)
Patient returned call

## 2018-07-22 NOTE — Telephone Encounter (Signed)
Unable to leave a message to call back.

## 2018-07-22 NOTE — Telephone Encounter (Signed)
ENT referral placed. She could be offered an appointment in clinic for re-evaluation as well.

## 2018-07-22 NOTE — Telephone Encounter (Signed)
Pt returned call. Pt says that she has been taking medication but she is still having dizziness. Pt says that she doesn't want to have rehab, she would rather see an ENT.

## 2018-07-24 NOTE — Telephone Encounter (Signed)
Unable to reach patient.

## 2018-09-29 ENCOUNTER — Ambulatory Visit (INDEPENDENT_AMBULATORY_CARE_PROVIDER_SITE_OTHER): Payer: Medicare HMO | Admitting: Family Medicine

## 2018-09-29 ENCOUNTER — Encounter: Payer: Self-pay | Admitting: Family Medicine

## 2018-09-29 ENCOUNTER — Ambulatory Visit (INDEPENDENT_AMBULATORY_CARE_PROVIDER_SITE_OTHER): Payer: Medicare HMO

## 2018-09-29 VITALS — BP 130/76 | HR 70 | Temp 98.4°F | Ht 73.0 in | Wt 208.6 lb

## 2018-09-29 DIAGNOSIS — R0982 Postnasal drip: Secondary | ICD-10-CM

## 2018-09-29 DIAGNOSIS — R059 Cough, unspecified: Secondary | ICD-10-CM

## 2018-09-29 DIAGNOSIS — R05 Cough: Secondary | ICD-10-CM

## 2018-09-29 DIAGNOSIS — J22 Unspecified acute lower respiratory infection: Secondary | ICD-10-CM | POA: Diagnosis not present

## 2018-09-29 MED ORDER — HYDROCOD POLST-CPM POLST ER 10-8 MG/5ML PO SUER
5.0000 mL | Freq: Two times a day (BID) | ORAL | 0 refills | Status: DC | PRN
Start: 2018-09-29 — End: 2018-11-19

## 2018-09-29 MED ORDER — BENZONATATE 100 MG PO CAPS: 100.0000 mg | ORAL_CAPSULE | Freq: Three times a day (TID) | ORAL | 0 refills | Status: DC | PRN

## 2018-09-29 MED ORDER — DOXYCYCLINE HYCLATE 100 MG PO TABS
100.0000 mg | ORAL_TABLET | Freq: Two times a day (BID) | ORAL | 0 refills | Status: DC
Start: 2018-09-29 — End: 2018-11-19

## 2018-09-29 NOTE — Progress Notes (Signed)
Subjective:    Patient ID: Maria Hamilton, female    DOB: 1951/10/24, 67 y.o.   MRN: [id number]  HPI  Patient presents to clinic complaining of cough with thick white and gray mucus for 2 weeks.  States her energy level is low, also reports hot and cold chills over the last couple weeks at home.  States that many family members around her have been sick with respiratory type illnesses.  Denies any feelings of shortness of breath or wheezing.  States cough keeps her up at night, cannot get any rest.  Patient Active Problem List   Diagnosis Date Noted  . Spondylosis without myelopathy or radiculopathy, lumbosacral region 04/28/2018  . Chronic hip pain after total replacement of hip joint (Left) 04/28/2018  . Chronic hip pain after total replacement of hip joint (Right) 04/28/2018  . Chronic pain of hip  (Bilateral) (L>R) 04/28/2018  . Chronic sacroiliac joint pain (Bilateral) (L>R) 04/28/2018  . Lumbar facet syndrome (Bilateral) (L>R) 04/28/2018  . Vitamin B 12 deficiency 04/02/2018  . Pain of both shoulder joints 04/02/2018  . Degeneration of lumbar intervertebral disc 10/30/2017  . Osteoarthritis of hip 10/30/2017  . History of allergy to radiographic contrast media 10/29/2017  . Ankle pain 10/16/2017  . Anxiety and depression 10/16/2017  . Viral URI with cough 10/16/2017  . RLS (restless legs syndrome) 10/10/2017  . Night sweats 08/13/2017  . Supraclavicular fossa fullness 08/13/2017  . Chronic foot numbness (Left) 04/02/2017  . Vertigo 03/25/2017  . 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) 03/20/2017  . History of lumbar fusion (L3-4, L4-5, and L5-S1) 03/20/2017  . Post herpetic neuralgia 03/12/2017  . Abnormal MRI, lumbar spine (03/06/2017) 03/12/2017  . Memory difficulty 03/06/2017  . Polyneuropathy 03/06/2017  . Palpitations 11/26/2016  . Epigastric pain 11/26/2016  . Therapeutic opioid-induced constipation (OIC) 11/20/2016  . Constipation   .  Hypokalemia 09/27/2016  . Long term current use of opiate analgesic 09/24/2016  . Long term prescription opiate use 09/24/2016  . Opiate use 09/24/2016  . Encounter for therapeutic drug level monitoring 09/24/2016  . Encounter for pain management planning 09/24/2016  . Rheumatoid arthritis, multiple sites (positive RF) 09/24/2016  . Chronic lower extremity pain (Secondary source of pain) (Left) 09/24/2016  . Lumbar facet arthropathy (Bilateral) 09/24/2016  . Failed back surgical syndrome (x 3) 09/24/2016  . Epidural fibrosis 09/24/2016  . Neurogenic pain 09/24/2016  . Musculoskeletal pain 09/24/2016  . Chronic lumbar radicular pain (L5/S1 dermatome) (Left) 09/24/2016  . History of total hip replacement, bilateral 09/24/2016  . GERD (gastroesophageal reflux disease) 08/03/2015  . Rheumatoid factor positive 08/03/2015  . Chronic fatigue 08/03/2015  . Vitamin D deficiency 08/03/2015  . Insomnia 08/03/2015  . Fibromyalgia 08/03/2015  . Chronic low back pain (Primary Source of Pain) (Left) 08/03/2015  . Chronic pain syndrome 08/03/2015  . Depression 08/03/2015  . Hypertension 08/03/2015  . Acute anxiety 08/03/2015  . 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) 04/22/2012   Social History   Tobacco Use  . Smoking status: Never Smoker  . Smokeless tobacco: Never Used  Substance Use Topics  . Alcohol use: No    Alcohol/week: 0.0 standard drinks   Review of Systems   Constitutional: Positive for chills, fatigue and fever.  HENT: +sinus congestion. Negative for ear pain, sinus pain and sore throat.   Eyes: Negative.   Respiratory: +cough with thick phlegm. Negative  shortness of breath and wheezing.   Cardiovascular: Negative for chest pain, palpitations and  leg swelling.  Gastrointestinal: Negative for abdominal pain, diarrhea, nausea and vomiting.  Genitourinary: Negative for dysuria, frequency and urgency.  Musculoskeletal: Negative for arthralgias and myalgias.    Skin: Negative for color change, pallor and rash.  Neurological: Negative for syncope, light-headedness and headaches.  Psychiatric/Behavioral: The patient is not nervous/anxious.       Objective:   Physical Exam  Constitutional: She is oriented to person, place, and time. No distress.  HENT:  Head: Atraumatic.  Right Ear: External ear normal.  Left Ear: External ear normal.  +post nasal drip  Eyes: Conjunctivae and EOM are normal. No scleral icterus.  Neck: Neck supple. No tracheal deviation present.  Cardiovascular: Normal rate and regular rhythm.  Pulmonary/Chest: Effort normal. No respiratory distress.  Scattered rhonchi, improves after coughing  Musculoskeletal: Normal range of motion. She exhibits no edema.  Neurological: She is alert and oriented to person, place, and time.  Skin: Skin is warm and dry. No pallor.  Psychiatric: She has a normal mood and affect. Her behavior is normal.  Nursing note and vitals reviewed.  Vitals:   09/29/18 1021  BP: 130/76  Pulse: 70  Temp: 98.4 F (36.9 C)  SpO2: 95%      Assessment & Plan:   Lower respiratory tract infection/cough - due to length of time with cough we will get chest x-ray.  Patient will also take doxycycline twice daily for 10 days to cover any respiratory illness including pneumonia.  Patient will use Tussionex as needed for cough, patient is aware this medication can make her sleepy.  Patient also given a prescription for Tessalon Perles to use as needed for cough have a non-transient option for cough relief.  Advised to rest, increase fluids, do good handwashing.  PMP registry checked and is OK for Tussionex Rx  Postnasal drip - patient advised to do saline nasal flushes to help relieve postnasal drip also can use over-the-counter antihistamine like Claritin or Allegra.  Keep regularly scheduled follow-up as planned.  Return to clinic sooner if issues arise.

## 2018-10-03 ENCOUNTER — Other Ambulatory Visit: Payer: Self-pay | Admitting: Family Medicine

## 2018-10-03 NOTE — Telephone Encounter (Signed)
  Last office visit  04/02/18 No office visit scheduled

## 2018-10-04 NOTE — Telephone Encounter (Signed)
Sent to pharmacy.  Patient needs follow-up scheduled.  Please contact her to set follow-up set up a follow-up appointment.

## 2018-10-09 ENCOUNTER — Encounter: Payer: Self-pay | Admitting: *Deleted

## 2018-10-09 NOTE — Telephone Encounter (Signed)
Sent mychart message

## 2018-10-29 ENCOUNTER — Other Ambulatory Visit: Payer: Self-pay | Admitting: Nurse Practitioner

## 2018-10-29 DIAGNOSIS — M792 Neuralgia and neuritis, unspecified: Secondary | ICD-10-CM

## 2018-10-29 DIAGNOSIS — M7918 Myalgia, other site: Secondary | ICD-10-CM

## 2018-11-19 ENCOUNTER — Encounter: Payer: Self-pay | Admitting: Family Medicine

## 2018-11-19 ENCOUNTER — Ambulatory Visit (INDEPENDENT_AMBULATORY_CARE_PROVIDER_SITE_OTHER): Payer: Medicare HMO | Admitting: Family Medicine

## 2018-11-19 VITALS — BP 130/80 | HR 67 | Temp 97.3°F | Ht 73.0 in | Wt 208.2 lb

## 2018-11-19 DIAGNOSIS — R202 Paresthesia of skin: Secondary | ICD-10-CM | POA: Insufficient documentation

## 2018-11-19 DIAGNOSIS — N951 Menopausal and female climacteric states: Secondary | ICD-10-CM | POA: Insufficient documentation

## 2018-11-19 DIAGNOSIS — F329 Major depressive disorder, single episode, unspecified: Secondary | ICD-10-CM

## 2018-11-19 DIAGNOSIS — F419 Anxiety disorder, unspecified: Secondary | ICD-10-CM

## 2018-11-19 DIAGNOSIS — R200 Anesthesia of skin: Secondary | ICD-10-CM | POA: Insufficient documentation

## 2018-11-19 MED ORDER — AMLODIPINE BESYLATE 10 MG PO TABS: 10.0000 mg | ORAL_TABLET | Freq: Every day | ORAL | 0 refills | Status: DC

## 2018-11-19 MED ORDER — AMLODIPINE BESYLATE 10 MG PO TABS: 10.0000 mg | ORAL_TABLET | Freq: Every day | ORAL | 1 refills | Status: DC

## 2018-11-19 MED ORDER — GABAPENTIN 600 MG PO TABS: 600.0000 mg | ORAL_TABLET | Freq: Every day | ORAL | 1 refills | Status: DC

## 2018-11-19 MED ORDER — GABAPENTIN 600 MG PO TABS: 600.0000 mg | ORAL_TABLET | Freq: Every day | ORAL | 0 refills | Status: DC

## 2018-11-19 MED ORDER — ESCITALOPRAM OXALATE 10 MG PO TABS: ORAL_TABLET | ORAL | 1 refills | Status: DC

## 2018-11-19 MED ORDER — ESCITALOPRAM OXALATE 10 MG PO TABS: ORAL_TABLET | ORAL | 0 refills | Status: DC

## 2018-11-19 NOTE — Assessment & Plan Note (Addendum)
This has been going on for quite some time now.  She did have an MRI of her lumbar spine in 2018 with no noted obvious cause for her symptoms.  This could represent neuropathy versus nerve impingement.  It would seem unlikely to represent a central nervous system process given the focality.  We will refer to neurology.  She will continue gabapentin.  Given return precautions.

## 2018-11-19 NOTE — Assessment & Plan Note (Signed)
No depression.  She does note some anxiety.  We will start her on Lexapro.  She will follow-up in 2 months for reevaluation.

## 2018-11-19 NOTE — Progress Notes (Signed)
[name], MD Phone: [phone]  Maria Hamilton is a 67 y.o. female who presents today for follow-up.  CC: Left foot numbness and tingling, hot flashes, anxiety  Left foot numbness and tingling: Patient notes this has been going on for years.  Possibly a little worse over the last several months.  Occasionally has symptoms in her right foot as well.  Notes the numbness does go up to her upper shin.  She notes a throbbing discomfort at times.  She notes she had some swelling in her left foot and ankle previously though that has resolved.  She notes burning and tingling in her left foot.  She does have chronic back pain and underwent an epidural previously.  She notes it feels as though her left leg will give out on her at times.  She denies saddle anesthesia and incontinence.  She notes these symptoms have been gradual in onset.  She notes no numbness elsewhere.  Hot flashes: Patient has had these off and on since going through menopause.  Over the last couple of months she has noted them again.  They occur during the day and at night.  She will get heated from the waist up and then sweat some.  Non-drenching sweats.  She did have a fairly extensive evaluation for night sweats previously.  No weight loss.  No itching.  Anxiety: Patient notes this has been worse since she ran out of her medication.  She was on Zoloft despite [place] trying to change her to Lexapro previously.  She did not take the Lexapro.  No depression.  Social History   Tobacco Use  Smoking Status Never Smoker  Smokeless Tobacco Never Used     ROS see history of present illness  Objective  Physical Exam Vitals:   11/19/18 1510  BP: 130/80  Pulse: 67  Temp: (!) 97.3 F (36.3 C)  SpO2: 99%    BP Readings from Last 3 Encounters:  11/19/18 130/80  09/29/18 130/76  07/10/18 (!) 163/89   Wt Readings from Last 3 Encounters:  11/19/18 208 lb 3.2 oz (94.4 kg)  09/29/18 208 lb 9.6 oz (94.6 kg)  07/10/18 206  lb (93.4 kg)    Physical Exam  Constitutional: No distress.  Cardiovascular: Normal rate, regular rhythm and normal heart sounds.  Pulmonary/Chest: Effort normal and breath sounds normal.  Musculoskeletal: She exhibits no edema.  Neurological: She is alert.  CN 2-12 intact, 5/5 strength in bilateral biceps, triceps, grip, quads, hamstrings, plantar and dorsiflexion, sensation to light touch slightly decreased in left lower extremity from just below her knee down to her foot, otherwise intact in bilateral UE and LE, absent patellar reflexes bilaterally  Skin: Skin is warm and dry. She is not diaphoretic.     Assessment/Plan: Please see individual problem list.  Numbness and tingling of foot This has been going on for quite some time now.  She did have an MRI of her lumbar spine in 2018 with no noted obvious cause for her symptoms.  This could represent neuropathy versus nerve impingement.  It would seem unlikely to represent a central nervous system process given the focality.  We will refer to neurology.  She will continue gabapentin.  Given return precautions.  Hot flashes due to menopause Patient with menopausal hot flashes that have been going on for years.  Discussed trial of Lexapro for this and her anxiety.  She will restart on the gabapentin as she has been out of this for a week or so.  That could be contributing to some degree with her hot flashes.  If not improving she will let us know.  Anxiety and depression No depression.  She does note some anxiety.  We will start her on Lexapro.  She will follow-up in 2 months for reevaluation.    Orders Placed This Encounter  Procedures  . Ambulatory referral to Neurology    Referral Priority:   Routine    Referral Type:   Consultation    Referral Reason:   Specialty Services Required    Requested Specialty:   Neurology    Number of Visits Requested:   1    Meds ordered this encounter  Medications  . DISCONTD: amLODipine  (NORVASC) 10 MG tablet    Sig: Take 1 tablet (10 mg total) by mouth daily.    Dispense:  30 tablet    Refill:  0  . DISCONTD: escitalopram (LEXAPRO) 10 MG tablet    Sig: TAKE 1 TABLET(10 MG) BY MOUTH DAILY    Dispense:  30 tablet    Refill:  0  . DISCONTD: gabapentin (NEURONTIN) 600 MG tablet    Sig: Take 1 tablet (600 mg total) by mouth at bedtime.    Dispense:  30 tablet    Refill:  0    Do not place this medication, or any other prescription from our practice, on "Automatic Refill". Patient may have prescription filled one day early if pharmacy is closed on scheduled refill date.  Marland Kitchen amLODipine (NORVASC) 10 MG tablet    Sig: Take 1 tablet (10 mg total) by mouth daily.    Dispense:  90 tablet    Refill:  1  . escitalopram (LEXAPRO) 10 MG tablet    Sig: TAKE 1 TABLET(10 MG) BY MOUTH DAILY    Dispense:  90 tablet    Refill:  1  . gabapentin (NEURONTIN) 600 MG tablet    Sig: Take 1 tablet (600 mg total) by mouth at bedtime.    Dispense:  90 tablet    Refill:  1     [name], MD [place]

## 2018-11-19 NOTE — Patient Instructions (Addendum)
Nice to see you. We will refer you to neurology for your symptoms. We will restart your gabapentin.  We will have you start on Lexapro. If you develop worsening numbness or spreading numbness or you develop weakness please be evaluated immediately.

## 2018-11-19 NOTE — Assessment & Plan Note (Signed)
Patient with menopausal hot flashes that have been going on for years.  Discussed trial of Lexapro for this and her anxiety.  She will restart on the gabapentin as she has been out of this for a week or so.  That could be contributing to some degree with her hot flashes.  If not improving she will let us know.

## 2018-12-02 ENCOUNTER — Telehealth: Payer: Self-pay | Admitting: Family Medicine

## 2018-12-02 NOTE — Telephone Encounter (Signed)
Copied from [place] [id number]. Topic: Quick Communication - Rx Refill/Question >> Dec 02, 2018  8:28 AM Alfredia Ferguson R wrote: Medication: tizanidine (ZANAFLEX) 2 MG capsule   Has the patient contacted their pharmacy? No, pt was previously on med and would like to start back due to not sleeping  Preferred Pharmacy (with phone number or street name): [facility] (N), [place] - [place] [phone] (Phone) [phone] (Fax)    Agent: Please be advised that RX refills may take up to 3 business days. We ask that you follow-up with your pharmacy.

## 2018-12-02 NOTE — Telephone Encounter (Signed)
Pt. Is asking for Zanaflex to help her sleep. States she has had this medication in the past. Please advise pt. Thanks.

## 2018-12-02 NOTE — Telephone Encounter (Signed)
Sent to PCP. Please advise.  

## 2018-12-02 NOTE — Telephone Encounter (Signed)
It appears this medication was previously given for muscle spasms and not sleep. If she is having issues with sleep we can have her come in to the office to discuss this to determine the appropriate treatment. Thanks.

## 2018-12-03 NOTE — Telephone Encounter (Signed)
Called and spoke with patient. Pt stated that the medication is for her muscle spasms to help her sleep. Pt stated that the muscle spasms hurt so bad she can't sleep.

## 2018-12-04 MED ORDER — TIZANIDINE HCL 2 MG PO TABS: 2.0000 mg | ORAL_TABLET | Freq: Three times a day (TID) | ORAL | 0 refills | Status: DC | PRN

## 2018-12-04 NOTE — Telephone Encounter (Signed)
Called and spoke with pt's husband. Husband advised and voiced understanding. Will advise his wife Rx was refilled.

## 2018-12-04 NOTE — Telephone Encounter (Signed)
Noted.  Sent to [facility].

## 2018-12-04 NOTE — Telephone Encounter (Signed)
Called and spoke with patient. Pt stated that she has not seen pain clinic in over a year. Pt would like for you to consistor a short term supply. Sent to PCP.

## 2018-12-04 NOTE — Telephone Encounter (Signed)
Noted. It appears these previously were prescribed by pain management.  If she is still seeing them she needs to request the refill from them.  If she is not still seeing them I will consider a short-term refill until we can discuss in the office.

## 2019-01-10 ENCOUNTER — Other Ambulatory Visit: Payer: Self-pay | Admitting: Family Medicine

## 2019-02-06 ENCOUNTER — Other Ambulatory Visit: Payer: Self-pay | Admitting: Family Medicine

## 2019-03-17 ENCOUNTER — Telehealth: Payer: Self-pay | Admitting: Family Medicine

## 2019-03-17 NOTE — Telephone Encounter (Signed)
Pt left a voicemail message on 03/17/19 requesting 90 day supply refill of all medications to be sent to [facility]. Pt would also like a refill of Tizanidine but other medications not named specifically in voicemail message.

## 2019-03-18 ENCOUNTER — Telehealth: Payer: Self-pay

## 2019-03-18 ENCOUNTER — Telehealth: Payer: Self-pay | Admitting: Lab

## 2019-03-18 MED ORDER — POTASSIUM CHLORIDE CRYS ER 20 MEQ PO TBCR: 20.0000 meq | EXTENDED_RELEASE_TABLET | Freq: Every day | ORAL | 1 refills | Status: DC

## 2019-03-18 MED ORDER — GABAPENTIN 600 MG PO TABS: 600.0000 mg | ORAL_TABLET | Freq: Every day | ORAL | 1 refills | Status: DC

## 2019-03-18 MED ORDER — AMLODIPINE BESYLATE 10 MG PO TABS: 10.0000 mg | ORAL_TABLET | Freq: Every day | ORAL | 1 refills | Status: DC

## 2019-03-18 MED ORDER — ESCITALOPRAM OXALATE 10 MG PO TABS: ORAL_TABLET | ORAL | 1 refills | Status: DC

## 2019-03-18 MED ORDER — TIZANIDINE HCL 2 MG PO TABS: 2.0000 mg | ORAL_TABLET | Freq: Three times a day (TID) | ORAL | 0 refills | Status: DC | PRN

## 2019-03-18 MED ORDER — TIZANIDINE HCL 2 MG PO TABS: ORAL_TABLET | ORAL | 0 refills | Status: DC

## 2019-03-18 NOTE — Addendum Note (Signed)
Addended by: Leone Haven on: 03/18/2019 02:25 PM   Modules accepted: Orders

## 2019-03-18 NOTE — Telephone Encounter (Signed)
Pt called [facility] Pt left a voicemail message on 03/17/19 requesting 90 day supply refill of all medications to be sent to [facility]. Pt would also like a refill of Tizanidine but other medications not named specifically in voicemail message.

## 2019-03-18 NOTE — Addendum Note (Signed)
Addended by: Caryl Bis Jarod Bozzo G on: 03/18/2019 12:44 PM   Modules accepted: Orders

## 2019-03-18 NOTE — Telephone Encounter (Signed)
Potassium sent to pharmacy. I will not send in a 90 day supply of the tizanidine as that would be 270 pills of a muscle relaxer.

## 2019-03-18 NOTE — Telephone Encounter (Signed)
Called Pt and told her Rx was sent to pharmacy.

## 2019-03-18 NOTE — Telephone Encounter (Signed)
Pt rx for Tizanidine  was sent in to the pharmacy for 30 days and needs to be changed to a 90 day supply. She can't afford get 30 day supply the cost $230 however if she gets 90 supply it doesn't cost her anything.

## 2019-03-18 NOTE — Telephone Encounter (Signed)
Sent to pharmacy 

## 2019-03-18 NOTE — Telephone Encounter (Signed)
Checked pt's chart all Rx's have been refilled however, she does need 90 day supply on the tizanidine and potassium.   Tizanidine last refilled 03/18/2019 disp 30 with no refills can this be sent for 90 day supply?   Potassium last refilled 04/09/2018 disp 180 with no refills    Potassium last checked on 04/30/2018 at 3.1 normal is 3.5-51   Sent to PCP to advise

## 2019-03-18 NOTE — Telephone Encounter (Signed)
Called and spoke with pt. Pt advised she stated that if we are unable to send a 90 day supply to [facility] she will need a 30 day supply to be sent to [facility] in [place] on [address] due to a 30 day supply through [employer] cost 240 dollars.   I will call [employer] and have Rx for Tizanidine removed.

## 2019-03-18 NOTE — Telephone Encounter (Signed)
[facility] and Rx for tizandine has been removed.   Send in 30 day to [facility].

## 2019-03-18 NOTE — Telephone Encounter (Signed)
Medications sent to pharmacy

## 2019-03-19 NOTE — Telephone Encounter (Signed)
Per Dr. Caryl Bis he will not refill this prescription for a 90 day supply for muscle relaxer because pt would be disp 270 pills worth of muscle relaxers at once.   Sent to PCP again to see if he would be willing to send in 90 day sue to cost reasons for the patient.

## 2019-03-20 NOTE — Telephone Encounter (Signed)
I will not prescribe a 90 day supply of tizanidine for this patient as it would be 270 tablets of a muscle relaxer. We could check with her insurance regarding a preferred alternative and see if that would be cheaper.

## 2019-03-20 NOTE — Telephone Encounter (Signed)
Duplicated message   Called and spoke with pt. Pt stated that she did pick up the Rx at the local pharmacy which was more affordable than the 30 day supply being sent to mail order. Pt advised and voiced understanding.

## 2019-04-06 ENCOUNTER — Other Ambulatory Visit: Payer: Self-pay

## 2019-04-06 ENCOUNTER — Telehealth: Payer: Self-pay

## 2019-04-06 ENCOUNTER — Ambulatory Visit (INDEPENDENT_AMBULATORY_CARE_PROVIDER_SITE_OTHER): Payer: Medicare HMO | Admitting: Family Medicine

## 2019-04-06 DIAGNOSIS — M25562 Pain in left knee: Secondary | ICD-10-CM

## 2019-04-06 MED ORDER — METHYLPREDNISOLONE 4 MG PO TBPK: ORAL_TABLET | ORAL | 0 refills | Status: DC

## 2019-04-06 NOTE — Telephone Encounter (Signed)
Called to triage and schedule virtual appt.  No answer.  Unable to leave a voice message.

## 2019-04-06 NOTE — Progress Notes (Signed)
Patient ID: Maria Hamilton, female   DOB: June 26, 1951, 68 y.o.   MRN: [id number]   Virtual Visit via video Note  This visit type was conducted due to national recommendations for restrictions regarding the COVID-19 pandemic (e.g. social distancing).  This format is felt to be most appropriate for this patient at this time.  All issues noted in this document were discussed and addressed.  No physical exam was performed (except for noted visual exam findings with Video Visits).   I connected with Maria Hamilton on 04/06/19 at  3:00 PM EDT by a video enabled telemedicine application and verified that I am speaking with the correct person using two identifiers. Location patient: home Location provider: [facility] Persons participating in the virtual visit: patient, provider  I discussed the limitations, risks, security and privacy concerns of performing an evaluation and management service by video and the availability of in person appointments. I also discussed with the patient that there may be a patient responsible charge related to this service. The patient expressed understanding and agreed to proceed.  HPI:  Patient and I connected via video today to discuss left knee pain.  Patient states her left knee has been sore and feels slightly swollen.  States is been like this for about 1 week.  Denies any new injury or any falls.  Patient does not believe she has had any recent imaging of the left knee.  Currently will use Tylenol 500 mg anywhere from 3-6 times per day depending on how much any pain she is having.  Also has been trying to keep her knee elevated on pillows and using a warm pack to see if this would help reduce pain.  Denies fever or chills.  Denies any respiratory cardiac issues.  Denies any GI or GU symptoms.  ROS: See pertinent positives and negatives per HPI.  Past Medical History:  Diagnosis Date  . Acute anxiety 08/03/2015   Overview:  Last Assessment & Plan:   Klonopin most helpful  Reports past taking it 1 tab am 1/2 tab midday and 1 tab pm  30 day supply faxed to pharmacy  . Acute bronchitis 12/31/2016  . Acute pancreatitis 10/14/2016  . Altered mental status 10/12/2016  . Anxiety   . Bronchitis   . Chronic hip pain   . Chronic pain syndrome   . Depression   . Edema   . Fatigue   . Fibromyalgia   . GERD (gastroesophageal reflux disease)   . Headache   . Hypertension   . Insomnia   . Low back pain   . Right upper quadrant abdominal pain   . Vitamin D deficiency     Past Surgical History:  Procedure Laterality Date  . ABDOMINAL HYSTERECTOMY    . BACK SURGERY    . BREAST BIOPSY Left 90s   benign  . BREAST SURGERY    . CHOLECYSTECTOMY N/A 10/16/2016   Procedure: LAPAROSCOPIC CHOLECYSTECTOMY WITH INTRAOPERATIVE CHOLANGIOGRAM;  Surgeon: Tiney Rouge III, MD;  Location: [facility] ORS;  Service: General;  Laterality: N/A;  . cyst removal from wrist    . ECTOPIC PREGNANCY SURGERY    . HIP SURGERY     x4  . OOPHORECTOMY      Family History  Problem Relation Age of Onset  . Heart disease Mother   . Cancer Father   . Alcohol abuse Father    Social History   Tobacco Use  . Smoking status: Never Smoker  . Smokeless tobacco: Never Used  Substance  Use Topics  . Alcohol use: No    Alcohol/week: 0.0 standard drinks    Current Outpatient Medications:  .  amLODipine (NORVASC) 10 MG tablet, Take 1 tablet (10 mg total) by mouth daily., Disp: 90 tablet, Rfl: 1 .  calcium carbonate (OSCAL) 1500 (600 Ca) MG TABS tablet, Take 600 mg of elemental calcium by mouth daily with breakfast., Disp: , Rfl:  .  cholecalciferol (VITAMIN D) 1000 units tablet, Take 1,000 Units by mouth daily., Disp: , Rfl:  .  escitalopram (LEXAPRO) 10 MG tablet, TAKE 1 TABLET(10 MG) BY MOUTH DAILY, Disp: 90 tablet, Rfl: 1 .  gabapentin (NEURONTIN) 600 MG tablet, Take 1 tablet (600 mg total) by mouth at bedtime., Disp: 90 tablet, Rfl: 1 .  potassium chloride SA  (K-DUR,KLOR-CON) 20 MEQ tablet, Take 1 tablet (20 mEq total) by mouth daily., Disp: 90 tablet, Rfl: 1 .  tiZANidine (ZANAFLEX) 2 MG tablet, Take 1 tablet (2 mg total) by mouth every 8 (eight) hours as needed for muscle spasms., Disp: 90 tablet, Rfl: 0 .  methylPREDNISolone (MEDROL DOSEPAK) 4 MG TBPK tablet, Take according to pack instructions, Disp: 21 tablet, Rfl: 0  EXAM:  GENERAL: alert, oriented, appears well and in no acute distress  HEENT: atraumatic, conjunttiva clear, no obvious abnormalities on inspection of external nose and ears  NECK: normal movements of the head and neck  LUNGS: on inspection no signs of respiratory distress, breathing rate appears normal, no obvious gross SOB, gasping or wheezing  CV: no obvious cyanosis  MS: moves all visible extremities without noticeable abnormality.  Patient is able to fully bend and fully straighten out left leg at the knee joint without any obvious issues.  PSYCH/NEURO: pleasant and cooperative, no obvious depression or anxiety, speech and thought processing grossly intact  ASSESSMENT AND PLAN:  Discussed the following assessment and plan:  Acute pain of left knee - Plan: methylPREDNISolone (MEDROL DOSEPAK) 4 MG TBPK tablet  I suspect patient's knee pain is related to a flareup of arthritis.  At this time we are trying to avoid having patients come into the office if possible, so we will hold off on any imaging for now.  She will take low dose steroid taper to reduce pain/inflammation. Patient advised she can use Tylenol as needed for pain, advised that she can take a maximum of 3000 mg of Tylenol in a 24-hour period.  Also suggested she try wrapping the knee with either an Ace bandage or using a soft knee brace to compress the knee joint; often times some compression of a sore joint can help pain to improve.  Also suggested she try topical rub such as BenGay or Biofreeze to see if this helps reduce pain.   I discussed the assessment  and treatment plan with the patient. The patient was provided an opportunity to ask questions and all were answered. The patient agreed with the plan and demonstrated an understanding of the instructions.   The patient was advised to call back or seek an in-person evaluation if the symptoms worsen or if the condition fails to improve as anticipated.  If pain is persisting after the next 1 to 2 weeks, we can consider having patient come into the office for an x-ray.  Tracey Harries, FNP

## 2019-04-06 NOTE — Telephone Encounter (Signed)
Copied from [place] [id number]. Topic: Appointment Scheduling - Scheduling Inquiry for Clinic >> Apr 06, 2019 10:53 AM Virl Axe D wrote: Reason for CRM: Pt called to schedule virtual appointment with Dr. Caryl Bis. No answer on scheduling line. Please reach out to pt. [phone]

## 2019-04-07 ENCOUNTER — Encounter: Payer: Self-pay | Admitting: Family Medicine

## 2019-04-12 ENCOUNTER — Other Ambulatory Visit: Payer: Self-pay | Admitting: Family Medicine

## 2019-04-13 NOTE — Telephone Encounter (Signed)
Last OV 04/06/2019  Last refilled 03/18/2019 disp 90 with no refills   Next OV no appt scheduled   Sent to covering provider [place]

## 2019-04-13 NOTE — Telephone Encounter (Signed)
Call pt I refilled zanaflex 30 tablets on behalf of PCP  I see she was referred to neurology 11/2018. Was she seen? Did she decline referral at that time?   Please advise her to make a f/u with PCP and make an appt for her.

## 2019-04-20 ENCOUNTER — Ambulatory Visit (INDEPENDENT_AMBULATORY_CARE_PROVIDER_SITE_OTHER): Payer: Medicare HMO

## 2019-04-20 ENCOUNTER — Telehealth: Payer: Self-pay

## 2019-04-20 ENCOUNTER — Other Ambulatory Visit: Payer: Self-pay

## 2019-04-20 ENCOUNTER — Ambulatory Visit (INDEPENDENT_AMBULATORY_CARE_PROVIDER_SITE_OTHER): Payer: Medicare HMO | Admitting: Family Medicine

## 2019-04-20 DIAGNOSIS — M1712 Unilateral primary osteoarthritis, left knee: Secondary | ICD-10-CM

## 2019-04-20 DIAGNOSIS — M25562 Pain in left knee: Secondary | ICD-10-CM | POA: Diagnosis not present

## 2019-04-20 MED ORDER — HYDROCODONE-ACETAMINOPHEN 5-325 MG PO TABS: 1.0000 | ORAL_TABLET | Freq: Three times a day (TID) | ORAL | 0 refills | Status: DC | PRN

## 2019-04-20 NOTE — Telephone Encounter (Signed)
Called pt and scheduled her a virtual visit with L. Guse today. Gave all instructions.  Eevee Borbon,cma  Copied from [place] [id number]. Topic: General - Other >> Apr 20, 2019 10:50 AM Yvette Rack wrote: Reason for CRM: Pt stated she has taken all the medication but her knee still hurts. Pt stated she was told that the doctor is not seeing anyone at this time so she is not sure what to do. Attempted to warm transfer pt to the office to schedule virtual appt but I was placed on hold. Pt did not want to continue to hold. Informed pt that I would send a message making the office aware that she is still experiencing pain in her knee. Pt requests call back.

## 2019-04-20 NOTE — Progress Notes (Signed)
Patient ID: Maria Hamilton, female   DOB: May 07, 1951, 68 y.o.   MRN: [id number]  Virtual Visit via video Note  This visit type was conducted due to national recommendations for restrictions regarding the COVID-19 pandemic (e.g. social distancing).  This format is felt to be most appropriate for this patient at this time.  All issues noted in this document were discussed and addressed.  No physical exam was performed (except for noted visual exam findings with Video Visits).   I connected with Maria Hamilton on 04/20/19 at  2:00 PM EDT by a video enabled telemedicine application and verified that I am speaking with the correct person using two identifiers. Location patient: home Location provider: [facility] Persons participating in the virtual visit: patient, provider  I discussed the limitations, risks, security and privacy concerns of performing an evaluation and management service by video and the availability of in person appointments. I also discussed with the patient that there may be a patient responsible charge related to this service. The patient expressed understanding and agreed to proceed.  HPI:  Patient and I connected via video due to continued left knee pain.  Patient took the steroid taper down and it helped mildly to reduce pain for short time, but now pain is returned and causing difficulty with patient sleeping due to her being in pain throughout the night and being unable to get comfortable.  Still has not had any new falls or new injuries to the knee.  No fever or chills.  No cough, shortness of breath or wheezing.  No chest pain.  No GI or GU issues.  ROS: See pertinent positives and negatives per HPI.  Past Medical History:  Diagnosis Date  . Acute anxiety 08/03/2015   Overview:  Last Assessment & Plan:  Klonopin most helpful  Reports past taking it 1 tab am 1/2 tab midday and 1 tab pm  30 day supply faxed to pharmacy  . Acute bronchitis 12/31/2016  . Acute  pancreatitis 10/14/2016  . Altered mental status 10/12/2016  . Anxiety   . Bronchitis   . Chronic hip pain   . Chronic pain syndrome   . Depression   . Edema   . Fatigue   . Fibromyalgia   . GERD (gastroesophageal reflux disease)   . Headache   . Hypertension   . Insomnia   . Low back pain   . Right upper quadrant abdominal pain   . Vitamin D deficiency     Past Surgical History:  Procedure Laterality Date  . ABDOMINAL HYSTERECTOMY    . BACK SURGERY    . BREAST BIOPSY Left 90s   benign  . BREAST SURGERY    . CHOLECYSTECTOMY N/A 10/16/2016   Procedure: LAPAROSCOPIC CHOLECYSTECTOMY WITH INTRAOPERATIVE CHOLANGIOGRAM;  Surgeon: Tiney Rouge III, MD;  Location: [facility] ORS;  Service: General;  Laterality: N/A;  . cyst removal from wrist    . ECTOPIC PREGNANCY SURGERY    . HIP SURGERY     x4  . OOPHORECTOMY      Family History  Problem Relation Age of Onset  . Heart disease Mother   . Cancer Father   . Alcohol abuse Father    Social History   Tobacco Use  . Smoking status: Never Smoker  . Smokeless tobacco: Never Used  Substance Use Topics  . Alcohol use: No    Alcohol/week: 0.0 standard drinks    Current Outpatient Medications:  .  amLODipine (NORVASC) 10 MG tablet, Take 1 tablet (10  mg total) by mouth daily., Disp: 90 tablet, Rfl: 1 .  calcium carbonate (OSCAL) 1500 (600 Ca) MG TABS tablet, Take 600 mg of elemental calcium by mouth daily with breakfast., Disp: , Rfl:  .  cholecalciferol (VITAMIN D) 1000 units tablet, Take 1,000 Units by mouth daily., Disp: , Rfl:  .  escitalopram (LEXAPRO) 10 MG tablet, TAKE 1 TABLET(10 MG) BY MOUTH DAILY, Disp: 90 tablet, Rfl: 1 .  gabapentin (NEURONTIN) 600 MG tablet, Take 1 tablet (600 mg total) by mouth at bedtime., Disp: 90 tablet, Rfl: 1 .  methylPREDNISolone (MEDROL DOSEPAK) 4 MG TBPK tablet, Take according to pack instructions, Disp: 21 tablet, Rfl: 0 .  potassium chloride SA (K-DUR,KLOR-CON) 20 MEQ tablet, Take 1 tablet (20  mEq total) by mouth daily., Disp: 90 tablet, Rfl: 1 .  tiZANidine (ZANAFLEX) 2 MG tablet, TAKE 1 TABLET(2 MG) BY MOUTH EVERY 8 HOURS AS NEEDED FOR MUSCLE SPASMS, Disp: 30 tablet, Rfl: 0  EXAM:  GENERAL: alert, oriented, appears well and in no acute distress  HEENT: atraumatic, conjunttiva clear, no obvious abnormalities on inspection of external nose and ears  NECK: normal movements of the head and neck  LUNGS: on inspection no signs of respiratory distress, breathing rate appears normal, no obvious gross SOB, gasping or wheezing  CV: no obvious cyanosis  MS: moves all visible extremities without noticeable abnormality  PSYCH/NEURO: pleasant and cooperative, no obvious depression or anxiety, speech and thought processing grossly intact  ASSESSMENT AND PLAN:  Discussed the following assessment and plan:  Acute pain of left knee - Plan: HYDROcodone-acetaminophen (NORCO/VICODIN) 5-325 MG tablet, DG Knee Complete 4 Views Left, Ambulatory referral to Orthopedic Surgery  Arthritis of left knee - Plan: Ambulatory referral to Orthopedic Surgery  Due to patient's continued pain we will get a knee x-ray further investigate.  I strongly suspect she is having a flareup of arthritis and may need to see orthopedics for evaluation of knee to see if you be candidate for either a steroid injection and/or if she would need a knee replacement.  She has been advised she can use Tylenol as needed for mild to moderate pain, and a small supply of hydrocodone sent in for more severe pain.  Patient advised to not take more than 3000 mg of Tylenol in 1 day.  Also advised on hydrocodone if not going to be a long-term prescription, it is just for her acute pain at this time.   X-ray results obtained, does show tricompartmental arthritis changes in knee.  We will refer to orthopedics for further evaluation of knee pain.   I discussed the assessment and treatment plan with the patient. The patient was provided an  opportunity to ask questions and all were answered. The patient agreed with the plan and demonstrated an understanding of the instructions.   The patient was advised to call back or seek an in-person evaluation if the symptoms worsen or if the condition fails to improve as anticipated.   Tracey Harries, FNP

## 2019-04-21 ENCOUNTER — Encounter: Payer: Self-pay | Admitting: Family Medicine

## 2019-04-23 DIAGNOSIS — M1712 Unilateral primary osteoarthritis, left knee: Secondary | ICD-10-CM | POA: Diagnosis not present

## 2019-07-01 ENCOUNTER — Other Ambulatory Visit: Payer: Self-pay | Admitting: Family

## 2019-07-07 ENCOUNTER — Telehealth: Payer: Self-pay

## 2019-07-07 ENCOUNTER — Ambulatory Visit: Payer: Medicare HMO

## 2019-07-07 NOTE — Telephone Encounter (Signed)
Called patient at scheduled time for initial annual wellness visit. No answer. Not able to leave voicemail. Please reschedule appointment with nurse health advisor as appropriate.

## 2019-08-05 ENCOUNTER — Ambulatory Visit: Payer: Medicare HMO | Admitting: Family Medicine

## 2019-08-06 ENCOUNTER — Other Ambulatory Visit: Payer: Self-pay

## 2019-08-07 ENCOUNTER — Ambulatory Visit (INDEPENDENT_AMBULATORY_CARE_PROVIDER_SITE_OTHER): Payer: Medicare HMO | Admitting: Family Medicine

## 2019-08-07 ENCOUNTER — Encounter: Payer: Self-pay | Admitting: Family Medicine

## 2019-08-07 ENCOUNTER — Other Ambulatory Visit: Payer: Self-pay

## 2019-08-07 VITALS — BP 140/80 | HR 66 | Temp 98.4°F | Ht 73.0 in | Wt 207.2 lb

## 2019-08-07 DIAGNOSIS — N951 Menopausal and female climacteric states: Secondary | ICD-10-CM

## 2019-08-07 DIAGNOSIS — R200 Anesthesia of skin: Secondary | ICD-10-CM

## 2019-08-07 DIAGNOSIS — M7989 Other specified soft tissue disorders: Secondary | ICD-10-CM | POA: Diagnosis not present

## 2019-08-07 DIAGNOSIS — R202 Paresthesia of skin: Secondary | ICD-10-CM

## 2019-08-07 DIAGNOSIS — I10 Essential (primary) hypertension: Secondary | ICD-10-CM | POA: Diagnosis not present

## 2019-08-07 DIAGNOSIS — G8929 Other chronic pain: Secondary | ICD-10-CM

## 2019-08-07 DIAGNOSIS — M5442 Lumbago with sciatica, left side: Secondary | ICD-10-CM | POA: Diagnosis not present

## 2019-08-07 NOTE — Assessment & Plan Note (Signed)
Improved.  Continue gabapentin.

## 2019-08-07 NOTE — Progress Notes (Signed)
[name], MD Phone: [phone]  Maria Hamilton is a 68 y.o. female who presents today for follow-up.  Left foot numbness and tingling: This has been going on for greater than a year.  She has seen neurosurgery and it looks like they recommended CT myelogram and EMG though these were not completed.  She continues to have tingling in her foot and into her left lower leg.  She notes some numbness below her knee.  No weakness.  Gabapentin has not been helpful.  She has been tried on Lyrica for this previously without any benefit.  She has had a lumbar spine fusion previously and reports 5 prior back surgeries.  There has been no significant sudden change in this.  Chronic back pain: No change in this.  No saddle anesthesia, incontinence, or radiation.  She does take tizanidine for this.  Hot flashes: Patient notes gabapentin has been somewhat beneficial for this.  She notes the symptoms have improved.  HYPERTENSION  Disease Monitoring  Home BP Monitoring not checking cosistently Chest pain- no    Dyspnea- no Medications  Compliance-  Taking amlodipine. Edema- no  Patient notes her left second toe has difficulty bending.  She notes no injury.  It has been like that for quite some time.  She feels as though it slightly swollen.  She is able to bend all her other toes.  There is no significant pain.    Social History   Tobacco Use  Smoking Status Never Smoker  Smokeless Tobacco Never Used     ROS see history of present illness  Objective  Physical Exam Vitals:   08/07/19 1509  BP: 140/80  Pulse: 66  Temp: 98.4 F (36.9 C)  SpO2: 99%    BP Readings from Last 3 Encounters:  08/07/19 140/80  11/19/18 130/80  09/29/18 130/76   Wt Readings from Last 3 Encounters:  08/07/19 207 lb 3.2 oz (94 kg)  11/19/18 208 lb 3.2 oz (94.4 kg)  09/29/18 208 lb 9.6 oz (94.6 kg)    Physical Exam Constitutional:      General: She is not in acute distress.    Appearance: She is  not diaphoretic.  Cardiovascular:     Rate and Rhythm: Normal rate and regular rhythm.     Heart sounds: Normal heart sounds.     Comments: 2+ DP and PT pulses Pulmonary:     Effort: Pulmonary effort is normal.     Breath sounds: Normal breath sounds.  Musculoskeletal:     Right lower leg: No edema.     Left lower leg: No edema.  Skin:    General: Skin is warm and dry.  Neurological:     Mental Status: She is alert.     Comments: CN 3-12 intact, 5/5 strength in bilateral biceps, triceps, grip, quads, hamstrings, plantar and dorsiflexion, sensation to light touch decreased in left lower extremity below knee, otherwise intact in bilateral UE and right lower extremity and upper left lower extremity, normal gait     Patient has follow-up this next  Assessment/Plan: Please see individual problem list.  Hypertension Stable.  Continue amlodipine.  Check labs.  Numbness and tingling of foot Chronic issue.  No significant changes.  Will refer to neurology for evaluation.  Hot flashes due to menopause Improved.  Continue gabapentin.  Chronic low back pain (Primary Source of Pain) (Left) Chronic issue.  She will monitor.  Continue as needed tizanidine.  Toe swelling Offered x-ray and podiatry referral though she declined  these.  She opted to monitor her second toe on the left foot.   Orders Placed This Encounter  Procedures  . Comp Met (CMET)  . Lipid panel  . HgB A1c  . Ambulatory referral to Neurology    Referral Priority:   Routine    Referral Type:   Consultation    Referral Reason:   Specialty Services Required    Requested Specialty:   Neurology    Number of Visits Requested:   1    No orders of the defined types were placed in this encounter.    [name], MD [place]

## 2019-08-07 NOTE — Patient Instructions (Signed)
Nice to see you. We will get you to see neurology for your left foot pain and numbness. Please continue with your current dose of gabapentin.

## 2019-08-07 NOTE — Assessment & Plan Note (Signed)
Stable.  Continue amlodipine.  Check labs.

## 2019-08-07 NOTE — Assessment & Plan Note (Signed)
Chronic issue.  She will monitor.  Continue as needed tizanidine.

## 2019-08-07 NOTE — Assessment & Plan Note (Signed)
Offered x-ray and podiatry referral though she declined these.  She opted to monitor her second toe on the left foot.

## 2019-08-07 NOTE — Assessment & Plan Note (Signed)
Chronic issue.  No significant changes.  Will refer to neurology for evaluation.

## 2019-08-08 LAB — COMPREHENSIVE METABOLIC PANEL
AG Ratio: 1.1 (calc) (ref 1.0–2.5)
ALT: 8 U/L (ref 6–29)
AST: 15 U/L (ref 10–35)
Albumin: 4.1 g/dL (ref 3.6–5.1)
Alkaline phosphatase (APISO): 100 U/L (ref 37–153)
BUN: 8 mg/dL (ref 7–25)
CO2: 28 mmol/L (ref 20–32)
Calcium: 9.5 mg/dL (ref 8.6–10.4)
Chloride: 101 mmol/L (ref 98–110)
Creat: 0.76 mg/dL (ref 0.50–0.99)
Globulin: 3.6 g/dL (calc) (ref 1.9–3.7)
Glucose, Bld: 88 mg/dL (ref 65–99)
Potassium: 3.7 mmol/L (ref 3.5–5.3)
Sodium: 139 mmol/L (ref 135–146)
Total Bilirubin: 0.4 mg/dL (ref 0.2–1.2)
Total Protein: 7.7 g/dL (ref 6.1–8.1)

## 2019-08-08 LAB — LIPID PANEL
Cholesterol: 210 mg/dL — ABNORMAL HIGH (ref <200)
HDL: 50 mg/dL (ref >=50)
LDL Cholesterol (Calc): 135 mg/dL (calc) — ABNORMAL HIGH
Non-HDL Cholesterol (Calc): 160 mg/dL (calc) — ABNORMAL HIGH (ref <130)
Total CHOL/HDL Ratio: 4.2 (calc) (ref <5.0)
Triglycerides: 130 mg/dL (ref <150)

## 2019-08-08 LAB — HEMOGLOBIN A1C
Hgb A1c MFr Bld: 5.5 % of total Hgb (ref <5.7)
Mean Plasma Glucose: 111 (calc)
eAG (mmol/L): 6.2 (calc)

## 2019-08-12 ENCOUNTER — Other Ambulatory Visit: Payer: Self-pay | Admitting: Family Medicine

## 2019-08-12 DIAGNOSIS — E7800 Pure hypercholesterolemia, unspecified: Secondary | ICD-10-CM

## 2019-08-12 MED ORDER — ROSUVASTATIN CALCIUM 20 MG PO TABS: 20.0000 mg | ORAL_TABLET | Freq: Every day | ORAL | 3 refills | Status: DC

## 2019-08-16 ENCOUNTER — Other Ambulatory Visit: Payer: Self-pay | Admitting: Family

## 2019-09-24 ENCOUNTER — Other Ambulatory Visit: Payer: Self-pay | Admitting: Family Medicine

## 2019-10-06 ENCOUNTER — Other Ambulatory Visit: Payer: Medicare HMO

## 2019-10-26 ENCOUNTER — Other Ambulatory Visit: Payer: Self-pay | Admitting: Family Medicine

## 2019-10-30 ENCOUNTER — Telehealth: Payer: Self-pay

## 2019-10-30 MED ORDER — TIZANIDINE HCL 2 MG PO TABS: ORAL_TABLET | ORAL | 1 refills | Status: DC

## 2019-10-30 NOTE — Telephone Encounter (Signed)
Refilled

## 2019-11-11 ENCOUNTER — Ambulatory Visit: Payer: Medicare HMO

## 2020-01-12 ENCOUNTER — Other Ambulatory Visit: Payer: Self-pay

## 2020-01-12 ENCOUNTER — Ambulatory Visit (INDEPENDENT_AMBULATORY_CARE_PROVIDER_SITE_OTHER): Payer: Medicare HMO | Admitting: Internal Medicine

## 2020-01-12 ENCOUNTER — Encounter: Payer: Self-pay | Admitting: Internal Medicine

## 2020-01-12 VITALS — BP 158/89 | HR 70 | Ht 73.0 in | Wt 203.0 lb

## 2020-01-12 DIAGNOSIS — Z1389 Encounter for screening for other disorder: Secondary | ICD-10-CM | POA: Diagnosis not present

## 2020-01-12 DIAGNOSIS — I10 Essential (primary) hypertension: Secondary | ICD-10-CM | POA: Diagnosis not present

## 2020-01-12 DIAGNOSIS — E559 Vitamin D deficiency, unspecified: Secondary | ICD-10-CM | POA: Diagnosis not present

## 2020-01-12 DIAGNOSIS — R600 Localized edema: Secondary | ICD-10-CM | POA: Insufficient documentation

## 2020-01-12 DIAGNOSIS — E785 Hyperlipidemia, unspecified: Secondary | ICD-10-CM | POA: Diagnosis not present

## 2020-01-12 DIAGNOSIS — R634 Abnormal weight loss: Secondary | ICD-10-CM

## 2020-01-12 DIAGNOSIS — Z1329 Encounter for screening for other suspected endocrine disorder: Secondary | ICD-10-CM | POA: Diagnosis not present

## 2020-01-12 MED ORDER — SPIRONOLACTONE 25 MG PO TABS: 12.5000 mg | ORAL_TABLET | Freq: Every day | ORAL | 0 refills | Status: DC

## 2020-01-12 MED ORDER — AMLODIPINE BESYLATE 5 MG PO TABS: 5.0000 mg | ORAL_TABLET | Freq: Every day | ORAL | Status: DC

## 2020-01-12 MED ORDER — TIZANIDINE HCL 2 MG PO TABS: 2.0000 mg | ORAL_TABLET | Freq: Every day | ORAL | 1 refills | Status: DC

## 2020-01-12 NOTE — Progress Notes (Signed)
Telephone Note  I connected with Maria Hamilton  on 01/12/20 at  1:10 PM EST telephone and verified that I am speaking with the correct person using two identifiers.  Location patient: home Location provider:work or home office Persons participating in the virtual visit: patient, provider, pts husband  I discussed the limitations of evaluation and management by telemedicine and the availability of in person appointments. The patient expressed understanding and agreed to proceed.   HPI: 1. C/o worsening leg edema w/in last 1-2 weeks l>r no redness, pain. She reports edema in ankles and pitting h/o neuropathy, fibromyalgia but no h/o blood clot. She is drinking 4-5 16.9 ounces of coke qd and 1 bottle of water and not a lot of salt  Left foot is dark on top and feet are cold to touch she sleeps with heating blanket   2. Wants refill of zanaflex  3. Reports wt loss from size 20 to 14/16 appetite reduced and stopped drinking beer   ROS: See pertinent positives and negatives per HPI.  Past Medical History:  Diagnosis Date  . Acute anxiety 08/03/2015   Overview:  Last Assessment & Plan:  Klonopin most helpful  Reports past taking it 1 tab am 1/2 tab midday and 1 tab pm  30 day supply faxed to pharmacy  . Acute bronchitis 12/31/2016  . Acute pancreatitis 10/14/2016  . Altered mental status 10/12/2016  . Anxiety   . Bronchitis   . Chronic hip pain   . Chronic pain syndrome   . Depression   . Edema   . Fatigue   . Fibromyalgia   . GERD (gastroesophageal reflux disease)   . Headache   . Hypertension   . Insomnia   . Low back pain   . Right upper quadrant abdominal pain   . Vitamin D deficiency     Past Surgical History:  Procedure Laterality Date  . ABDOMINAL HYSTERECTOMY    . BACK SURGERY    . BREAST BIOPSY Left 90s   benign  . BREAST SURGERY    . CHOLECYSTECTOMY N/A 10/16/2016   Procedure: LAPAROSCOPIC CHOLECYSTECTOMY WITH INTRAOPERATIVE CHOLANGIOGRAM;  Surgeon: Dia Crawford  III, MD;  Location: [facility] ORS;  Service: General;  Laterality: N/A;  . cyst removal from wrist    . ECTOPIC PREGNANCY SURGERY    . HIP SURGERY     x4  . OOPHORECTOMY      Family History  Problem Relation Age of Onset  . Heart disease Mother   . Cancer Father   . Alcohol abuse Father     SOCIAL HX: lives with husband    Current Outpatient Medications:  .  amLODipine (NORVASC) 5 MG tablet, Take 1 tablet (5 mg total) by mouth daily., Disp: , Rfl:  .  calcium carbonate (OSCAL) 1500 (600 Ca) MG TABS tablet, Take 600 mg of elemental calcium by mouth daily with breakfast., Disp: , Rfl:  .  cholecalciferol (VITAMIN D) 1000 units tablet, Take 1,000 Units by mouth daily., Disp: , Rfl:  .  escitalopram (LEXAPRO) 10 MG tablet, TAKE 1 TABLET EVERY DAY, Disp: 90 tablet, Rfl: 1 .  gabapentin (NEURONTIN) 600 MG tablet, TAKE 1 TABLET AT BEDTIME, Disp: 90 tablet, Rfl: 1 .  HYDROcodone-acetaminophen (NORCO/VICODIN) 5-325 MG tablet, Take 1 tablet by mouth every 8 (eight) hours as needed for moderate pain., Disp: 15 tablet, Rfl: 0 .  methylPREDNISolone (MEDROL DOSEPAK) 4 MG TBPK tablet, Take according to pack instructions, Disp: 21 tablet, Rfl: 0 .  potassium chloride  SA (KLOR-CON) 20 MEQ tablet, TAKE 1 TABLET EVERY DAY, Disp: 90 tablet, Rfl: 1 .  rosuvastatin (CRESTOR) 20 MG tablet, Take 1 tablet (20 mg total) by mouth daily., Disp: 90 tablet, Rfl: 3 .  tiZANidine (ZANAFLEX) 2 MG tablet, Take 1 tablet (2 mg total) by mouth at bedtime., Disp: 30 tablet, Rfl: 1 .  spironolactone (ALDACTONE) 25 MG tablet, Take 0.5 tablets (12.5 mg total) by mouth daily. In am, Disp: 90 tablet, Rfl: 0  EXAM:  VITALS per patient if applicable:  GENERAL: alert, oriented, appears well and in no acute distress  PSYCH/NEURO: pleasant and cooperative, no obvious depression or anxiety, speech and thought processing grossly intact  ASSESSMENT AND PLAN:  Discussed the following assessment and plan:  Leg edema - Plan:  Comprehensive metabolic panel, TSH, Urinalysis, Routine w reflex microscopic, spironolactone (ALDACTONE) 12.5 MG tablet in am reduce norvasc 10 to 5 mg qd, Pro b natriuretic peptide if elevated  Consider echo  Monitor BP  Consider vascular if color changes leg edema continues w/u PAD Check TSH   Essential hypertension - Plan: Comprehensive metabolic panel, CBC with Differential/Platelet, amLODipine (NORVASC) 5 MG tablet reduce and add spironolactone 12.5 mg qam Goal BP <130/<80   Hyperlipidemia, unspecified hyperlipidemia type - Plan: Lipid panel  Vitamin D deficiency - Plan: Vitamin D (25 hydroxy)  Weight loss Address health maintenance with PCP maybe due to reduced appetite and reduced beer intake  -we discussed possible serious and likely etiologies, options for evaluation and workup, limitations of telemedicine visit vs in person visit, treatment, treatment risks and precautions. Pt prefers to treat via telemedicine empirically rather then risking or undertaking an in person visit at this moment. Patient agrees to seek prompt in person care if worsening, new symptoms arise, or if is not improving with treatment.   I discussed the assessment and treatment plan with the patient. The patient was provided an opportunity to ask questions and all were answered. The patient agreed with the plan and demonstrated an understanding of the instructions.   The patient was advised to call back or seek an in-person evaluation if the symptoms worsen or if the condition fails to improve as anticipated.  Time spent 25 minutes Delorise Jackson, MD

## 2020-01-12 NOTE — Patient Instructions (Addendum)
Limit total fluids to 64 ounces daily  Reduce norvasc to 5 mg daily 1/2 of 10 mg pill    DASH Eating Plan DASH stands for "Dietary Approaches to Stop Hypertension." The DASH eating plan is a healthy eating plan that has been shown to reduce high blood pressure (hypertension). It may also reduce your risk for type 2 diabetes, heart disease, and stroke. The DASH eating plan may also help with weight loss. What are tips for following this plan?  General guidelines  Avoid eating more than 2,300 mg (milligrams) of salt (sodium) a day. If you have hypertension, you may need to reduce your sodium intake to 1,500 mg a day.  Limit alcohol intake to no more than 1 drink a day for nonpregnant women and 2 drinks a day for men. One drink equals 12 oz of beer, 5 oz of wine, or 1 oz of hard liquor.  Work with your health care provider to maintain a healthy body weight or to lose weight. Ask what an ideal weight is for you.  Get at least 30 minutes of exercise that causes your heart to beat faster (aerobic exercise) most days of the week. Activities may include walking, swimming, or biking.  Work with your health care provider or diet and nutrition specialist (dietitian) to adjust your eating plan to your individual calorie needs. Reading food labels   Check food labels for the amount of sodium per serving. Choose foods with less than 5 percent of the Daily Value of sodium. Generally, foods with less than 300 mg of sodium per serving fit into this eating plan.  To find whole grains, look for the word "whole" as the first word in the ingredient list. Shopping  Buy products labeled as "low-sodium" or "no salt added."  Buy fresh foods. Avoid canned foods and premade or frozen meals. Cooking  Avoid adding salt when cooking. Use salt-free seasonings or herbs instead of table salt or sea salt. Check with your health care provider or pharmacist before using salt substitutes.  Do not fry foods. Cook foods  using healthy methods such as baking, boiling, grilling, and broiling instead.  Cook with heart-healthy oils, such as olive, canola, soybean, or sunflower oil. Meal planning  Eat a balanced diet that includes: ? 5 or more servings of fruits and vegetables each day. At each meal, try to fill half of your plate with fruits and vegetables. ? Up to 6-8 servings of whole grains each day. ? Less than 6 oz of lean meat, poultry, or fish each day. A 3-oz serving of meat is about the same size as a deck of cards. One egg equals 1 oz. ? 2 servings of low-fat dairy each day. ? A serving of nuts, seeds, or beans 5 times each week. ? Heart-healthy fats. Healthy fats called Omega-3 fatty acids are found in foods such as flaxseeds and coldwater fish, like sardines, salmon, and mackerel.  Limit how much you eat of the following: ? Canned or prepackaged foods. ? Food that is high in trans fat, such as fried foods. ? Food that is high in saturated fat, such as fatty meat. ? Sweets, desserts, sugary drinks, and other foods with added sugar. ? Full-fat dairy products.  Do not salt foods before eating.  Try to eat at least 2 vegetarian meals each week.  Eat more home-cooked food and less restaurant, buffet, and fast food.  When eating at a restaurant, ask that your food be prepared with less salt or  no salt, if possible. What foods are recommended? The items listed may not be a complete list. Talk with your dietitian about what dietary choices are best for you. Grains Whole-grain or whole-wheat bread. Whole-grain or whole-wheat pasta. Brown rice. [place]. Bulgur. Whole-grain and low-sodium cereals. Pita bread. Low-fat, low-sodium crackers. Whole-wheat flour tortillas. Vegetables Fresh or frozen vegetables (raw, steamed, roasted, or grilled). Low-sodium or reduced-sodium tomato and vegetable juice. Low-sodium or reduced-sodium tomato sauce and tomato paste. Low-sodium or reduced-sodium canned  vegetables. Fruits All fresh, dried, or frozen fruit. Canned fruit in natural juice (without added sugar). Meat and other protein foods Skinless chicken or [place]. Ground chicken or [place]. Pork with fat trimmed off. Fish and seafood. Egg whites. Dried beans, peas, or lentils. Unsalted nuts, nut butters, and seeds. Unsalted canned beans. Lean cuts of beef with fat trimmed off. Low-sodium, lean deli meat. Dairy Low-fat (1%) or fat-free (skim) milk. Fat-free, low-fat, or reduced-fat cheeses. Nonfat, low-sodium ricotta or cottage cheese. Low-fat or nonfat yogurt. Low-fat, low-sodium cheese. Fats and oils Soft margarine without trans fats. Vegetable oil. Low-fat, reduced-fat, or light mayonnaise and salad dressings (reduced-sodium). Canola, safflower, olive, soybean, and sunflower oils. Avocado. Seasoning and other foods Herbs. Spices. Seasoning mixes without salt. Unsalted popcorn and pretzels. Fat-free sweets. What foods are not recommended? The items listed may not be a complete list. Talk with your dietitian about what dietary choices are best for you. Grains Baked goods made with fat, such as croissants, muffins, or some breads. Dry pasta or rice meal packs. Vegetables Creamed or fried vegetables. Vegetables in a cheese sauce. Regular canned vegetables (not low-sodium or reduced-sodium). Regular canned tomato sauce and paste (not low-sodium or reduced-sodium). Regular tomato and vegetable juice (not low-sodium or reduced-sodium). Angie Fava. Olives. Fruits Canned fruit in a light or heavy syrup. Fried fruit. Fruit in cream or butter sauce. Meat and other protein foods Fatty cuts of meat. Ribs. Fried meat. Berniece Salines. Sausage. Bologna and other processed lunch meats. Salami. Fatback. Hotdogs. Bratwurst. Salted nuts and seeds. Canned beans with added salt. Canned or smoked fish. Whole eggs or egg yolks. Chicken or [place] with skin. Dairy Whole or 2% milk, cream, and half-and-half. Whole or full-fat  cream cheese. Whole-fat or sweetened yogurt. Full-fat cheese. Nondairy creamers. Whipped toppings. Processed cheese and cheese spreads. Fats and oils Butter. Stick margarine. Lard. Shortening. Ghee. Bacon fat. Tropical oils, such as coconut, palm kernel, or palm oil. Seasoning and other foods Salted popcorn and pretzels. Onion salt, garlic salt, seasoned salt, table salt, and sea salt. Worcestershire sauce. Tartar sauce. Barbecue sauce. Teriyaki sauce. Soy sauce, including reduced-sodium. Steak sauce. Canned and packaged gravies. Fish sauce. Oyster sauce. Cocktail sauce. Horseradish that you find on the shelf. Ketchup. Mustard. Meat flavorings and tenderizers. Bouillon cubes. Hot sauce and Tabasco sauce. Premade or packaged marinades. Premade or packaged taco seasonings. Relishes. Regular salad dressings. Where to find more information:  National Heart, Lung, and [place]: https://wilson-eaton.com/  American Heart Association: www.heart.org Summary  The DASH eating plan is a healthy eating plan that has been shown to reduce high blood pressure (hypertension). It may also reduce your risk for type 2 diabetes, heart disease, and stroke.  With the DASH eating plan, you should limit salt (sodium) intake to 2,300 mg a day. If you have hypertension, you may need to reduce your sodium intake to 1,500 mg a day.  When on the DASH eating plan, aim to eat more fresh fruits and vegetables, whole grains, lean proteins, low-fat dairy, and  heart-healthy fats.  Work with your health care provider or diet and nutrition specialist (dietitian) to adjust your eating plan to your individual calorie needs. This information is not intended to replace advice given to you by your health care provider. Make sure you discuss any questions you have with your health care provider. Document Revised: 11/15/2017 Document Reviewed: 11/26/2016 Elsevier Patient Education  [place].  Edema  Edema is an abnormal  buildup of fluids in the body tissues and under the skin. Swelling of the legs, feet, and ankles is a common symptom that becomes more likely as you get older. Swelling is also common in looser tissues, like around the eyes. When the affected area is squeezed, the fluid may move out of that spot and leave a dent for a few moments. This dent is called pitting edema. There are many possible causes of edema. Eating too much salt (sodium) and being on your feet or sitting for a long time can cause edema in your legs, feet, and ankles. Hot weather may make edema worse. Common causes of edema include:  Heart failure.  Liver or kidney disease.  Weak leg blood vessels.  Cancer.  An injury.  Pregnancy.  Medicines.  Being obese.  Low protein levels in the blood. Edema is usually painless. Your skin may look swollen or shiny. Follow these instructions at home:  Keep the affected body part raised (elevated) above the level of your heart when you are sitting or lying down.  Do not sit still or stand for long periods of time.  Do not wear tight clothing. Do not wear garters on your upper legs.  Exercise your legs to get your circulation going. This helps to move the fluid back into your blood vessels, and it may help the swelling go down.  Wear elastic bandages or support stockings to reduce swelling as told by your health care provider.  Eat a low-salt (low-sodium) diet to reduce fluid as told by your health care provider.  Depending on the cause of your swelling, you may need to limit how much fluid you drink (fluid restriction).  Take over-the-counter and prescription medicines only as told by your health care provider. Contact a health care provider if:  Your edema does not get better with treatment.  You have heart, liver, or kidney disease and have symptoms of edema.  You have sudden and unexplained weight gain. Get help right away if:  You develop shortness of breath or chest  pain.  You cannot breathe when you lie down.  You develop pain, redness, or warmth in the swollen areas.  You have heart, liver, or kidney disease and suddenly get edema.  You have a fever and your symptoms suddenly get worse. Summary  Edema is an abnormal buildup of fluids in the body tissues and under the skin.  Eating too much salt (sodium) and being on your feet or sitting for a long time can cause edema in your legs, feet, and ankles.  Keep the affected body part raised (elevated) above the level of your heart when you are sitting or lying down. This information is not intended to replace advice given to you by your health care provider. Make sure you discuss any questions you have with your health care provider. Document Revised: 04/22/2019 Document Reviewed: 01/05/2017 Elsevier Patient Education  [place].

## 2020-01-25 ENCOUNTER — Ambulatory Visit (INDEPENDENT_AMBULATORY_CARE_PROVIDER_SITE_OTHER): Payer: Medicare HMO

## 2020-01-25 ENCOUNTER — Other Ambulatory Visit: Payer: Self-pay

## 2020-01-25 VITALS — Ht 73.0 in | Wt 203.0 lb

## 2020-01-25 DIAGNOSIS — Z0000 Encounter for general adult medical examination without abnormal findings: Secondary | ICD-10-CM

## 2020-01-25 DIAGNOSIS — Z1159 Encounter for screening for other viral diseases: Secondary | ICD-10-CM

## 2020-01-25 NOTE — Patient Instructions (Addendum)
Maria Hamilton , Thank you for taking time to come for your Medicare Wellness Visit. I appreciate your ongoing commitment to your health goals. Please review the following plan we discussed and let me know if I can assist you in the future.   These are the goals we discussed: Goals    . DIET - INCREASE WATER INTAKE     Drink less coke and tea products    . Prevent falls     Do not stand on chairs when reaching for objects.  Pace self when walking.         This is a list of the screening recommended for you and due dates:  Health Maintenance  Topic Date Due  .  Hepatitis C: One time screening is recommended by Center for Disease Control  (CDC) for  adults born from [birth year] through 1965.   06-18-1951  . Flu Shot  03/16/2020*  . DEXA scan (bone density measurement)  01/24/2021*  . Pneumonia vaccines (2 of 2 - PCV13) 01/24/2021*  . Mammogram  05/06/2020  . Colon Cancer Screening  05/08/2020  . Tetanus Vaccine  06/10/2022  *Topic was postponed. The date shown is not the original due date.

## 2020-01-25 NOTE — Progress Notes (Signed)
Tdap 06/10/2012   Health Maintenance Due  Topic Date Due  . Hepatitis C Screening  23-Nov-1951    Patient Care Team: Leone Haven, MD as PCP - General (Family Medicine)  Indicate any recent Medical Services you may have received from other than [facility] providers in the past year (date may be approximate).     Assessment:   This is a routine wellness examination for [place].  Nurse connected with patient 01/25/20 at 12:30 PM EST by a telephone enabled telemedicine application and verified that I am speaking with the correct person using two identifiers. Patient stated full name and DOB. Patient gave permission to continue with virtual visit. Patient's location was at home and Nurse's location was at [place] office.   Patient is alert and oriented x3. Patient notes some difficulty with memory and concentrating. Recall 2/3 words.   Health Maintenance Due: -Vaccines- declines  -Dexa Scan- declines -Hepatitis C Screening- consent given  See completed HM at the end of note.   Eye: Visual acuity not assessed. Virtual visit. Followed by their ophthalmologist.  Dental: Dentures- yes  Hearing: Demonstrates normal hearing during visit.  Safety:  Patient feels safe at home- yes Patient does have smoke detectors at home- yes Patient does wear sunscreen or protective clothing when in direct sunlight - yes Patient does wear seat belt  when in a moving vehicle - yes Patient drives- yes Adequate lighting in walkways free from debris- yes Grab bars and handrails used as appropriate- yes Ambulates with an assistive device- no Cell phone on person when ambulating outside of the home- yes  Social: Alcohol intake - no  Smoking history- never   Smokers in home? none Illicit drug use? none  Medication: Taking as directed and without issues.  Self managed - yes   Covid-19: Precautions and sickness symptoms discussed. Wears mask, social distancing, hand hygiene as appropriate.   Activities of Daily Living Patient denies needing assistance with: household chores, feeding themselves, getting from bed to chair, getting to the toilet, bathing/showering, dressing, managing money, or preparing meals.   Discussed the importance of a healthy diet, water intake and the benefits of aerobic exercise. Physical activity- active around the home, no routine.  Diet:  Regular; nibbles through out Water: poor intake Caffeine: Coke and [place] Tea  Other Providers Patient Care Team: Leone Haven, MD as PCP - General (Family Medicine)  Hearing/Vision screen  Hearing Screening   125Hz  250Hz  500Hz  1000Hz  2000Hz  3000Hz  4000Hz  6000Hz  8000Hz   Right ear:           Left ear:           Comments: Patient is able to hear conversational tones without difficulty.  No issues reported.  Vision Screening Comments: Wears corrective lenses Visual acuity not assessed, virtual visit.  They have seen their ophthalmologist.     Dietary issues and exercise activities discussed: Current Exercise Habits: The patient does not participate in regular exercise at present  Goals    . DIET - INCREASE WATER INTAKE     Drink less coke and tea products    . Prevent falls     Do not stand on chairs when reaching for objects.  Pace self when walking.        Depression Screen PHQ 2/9 Scores 01/25/2020 01/12/2020 08/07/2019 11/19/2018 04/28/2018  02/17/2018 01/20/2018  PHQ - 2 Score 0 0 0 2 0 0 0  Exception Documentation - - - Medical reason - - -    Fall  Tdap 06/10/2012   Health Maintenance Due  Topic Date Due  . Hepatitis C Screening  23-Nov-1951    Patient Care Team: Leone Haven, MD as PCP - General (Family Medicine)  Indicate any recent Medical Services you may have received from other than [facility] providers in the past year (date may be approximate).     Assessment:   This is a routine wellness examination for [place].  Nurse connected with patient 01/25/20 at 12:30 PM EST by a telephone enabled telemedicine application and verified that I am speaking with the correct person using two identifiers. Patient stated full name and DOB. Patient gave permission to continue with virtual visit. Patient's location was at home and Nurse's location was at [place] office.   Patient is alert and oriented x3. Patient notes some difficulty with memory and concentrating. Recall 2/3 words.   Health Maintenance Due: -Vaccines- declines  -Dexa Scan- declines -Hepatitis C Screening- consent given  See completed HM at the end of note.   Eye: Visual acuity not assessed. Virtual visit. Followed by their ophthalmologist.  Dental: Dentures- yes  Hearing: Demonstrates normal hearing during visit.  Safety:  Patient feels safe at home- yes Patient does have smoke detectors at home- yes Patient does wear sunscreen or protective clothing when in direct sunlight - yes Patient does wear seat belt  when in a moving vehicle - yes Patient drives- yes Adequate lighting in walkways free from debris- yes Grab bars and handrails used as appropriate- yes Ambulates with an assistive device- no Cell phone on person when ambulating outside of the home- yes  Social: Alcohol intake - no  Smoking history- never   Smokers in home? none Illicit drug use? none  Medication: Taking as directed and without issues.  Self managed - yes   Covid-19: Precautions and sickness symptoms discussed. Wears mask, social distancing, hand hygiene as appropriate.   Activities of Daily Living Patient denies needing assistance with: household chores, feeding themselves, getting from bed to chair, getting to the toilet, bathing/showering, dressing, managing money, or preparing meals.   Discussed the importance of a healthy diet, water intake and the benefits of aerobic exercise. Physical activity- active around the home, no routine.  Diet:  Regular; nibbles through out Water: poor intake Caffeine: Coke and [place] Tea  Other Providers Patient Care Team: Leone Haven, MD as PCP - General (Family Medicine)  Hearing/Vision screen  Hearing Screening   125Hz  250Hz  500Hz  1000Hz  2000Hz  3000Hz  4000Hz  6000Hz  8000Hz   Right ear:           Left ear:           Comments: Patient is able to hear conversational tones without difficulty.  No issues reported.  Vision Screening Comments: Wears corrective lenses Visual acuity not assessed, virtual visit.  They have seen their ophthalmologist.     Dietary issues and exercise activities discussed: Current Exercise Habits: The patient does not participate in regular exercise at present  Goals    . DIET - INCREASE WATER INTAKE     Drink less coke and tea products    . Prevent falls     Do not stand on chairs when reaching for objects.  Pace self when walking.        Depression Screen PHQ 2/9 Scores 01/25/2020 01/12/2020 08/07/2019 11/19/2018 04/28/2018  02/17/2018 01/20/2018  PHQ - 2 Score 0 0 0 2 0 0 0  Exception Documentation - - - Medical reason - - -    Fall  Tdap 06/10/2012   Health Maintenance Due  Topic Date Due  . Hepatitis C Screening  23-Nov-1951    Patient Care Team: Leone Haven, MD as PCP - General (Family Medicine)  Indicate any recent Medical Services you may have received from other than [facility] providers in the past year (date may be approximate).     Assessment:   This is a routine wellness examination for [place].  Nurse connected with patient 01/25/20 at 12:30 PM EST by a telephone enabled telemedicine application and verified that I am speaking with the correct person using two identifiers. Patient stated full name and DOB. Patient gave permission to continue with virtual visit. Patient's location was at home and Nurse's location was at [place] office.   Patient is alert and oriented x3. Patient notes some difficulty with memory and concentrating. Recall 2/3 words.   Health Maintenance Due: -Vaccines- declines  -Dexa Scan- declines -Hepatitis C Screening- consent given  See completed HM at the end of note.   Eye: Visual acuity not assessed. Virtual visit. Followed by their ophthalmologist.  Dental: Dentures- yes  Hearing: Demonstrates normal hearing during visit.  Safety:  Patient feels safe at home- yes Patient does have smoke detectors at home- yes Patient does wear sunscreen or protective clothing when in direct sunlight - yes Patient does wear seat belt  when in a moving vehicle - yes Patient drives- yes Adequate lighting in walkways free from debris- yes Grab bars and handrails used as appropriate- yes Ambulates with an assistive device- no Cell phone on person when ambulating outside of the home- yes  Social: Alcohol intake - no  Smoking history- never   Smokers in home? none Illicit drug use? none  Medication: Taking as directed and without issues.  Self managed - yes   Covid-19: Precautions and sickness symptoms discussed. Wears mask, social distancing, hand hygiene as appropriate.   Activities of Daily Living Patient denies needing assistance with: household chores, feeding themselves, getting from bed to chair, getting to the toilet, bathing/showering, dressing, managing money, or preparing meals.   Discussed the importance of a healthy diet, water intake and the benefits of aerobic exercise. Physical activity- active around the home, no routine.  Diet:  Regular; nibbles through out Water: poor intake Caffeine: Coke and [place] Tea  Other Providers Patient Care Team: Leone Haven, MD as PCP - General (Family Medicine)  Hearing/Vision screen  Hearing Screening   125Hz  250Hz  500Hz  1000Hz  2000Hz  3000Hz  4000Hz  6000Hz  8000Hz   Right ear:           Left ear:           Comments: Patient is able to hear conversational tones without difficulty.  No issues reported.  Vision Screening Comments: Wears corrective lenses Visual acuity not assessed, virtual visit.  They have seen their ophthalmologist.     Dietary issues and exercise activities discussed: Current Exercise Habits: The patient does not participate in regular exercise at present  Goals    . DIET - INCREASE WATER INTAKE     Drink less coke and tea products    . Prevent falls     Do not stand on chairs when reaching for objects.  Pace self when walking.        Depression Screen PHQ 2/9 Scores 01/25/2020 01/12/2020 08/07/2019 11/19/2018 04/28/2018  02/17/2018 01/20/2018  PHQ - 2 Score 0 0 0 2 0 0 0  Exception Documentation - - - Medical reason - - -    Fall  Risk Fall Risk  01/25/2020 01/12/2020 08/07/2019 11/19/2018 05/08/2018  Falls in the past year? 1 1 0 1 No  Number falls in past yr: 1 1 0 0 -  Injury with Fall? 0 0 - 0 -  Comment Slipped when climbing down from a chair 1 week ago. No injury, some soreness. - - - -  Risk for fall due to : - History of fall(s) - - -  Follow up Falls evaluation completed;Education provided - - - -   Timed Get Up and Go Performed no, virtual visit  Cognitive Function:     6CIT Screen 01/25/2020  What Year? 0 points  What month? 0 points  What time? 0 points  Count back from 20 0 points  Months in reverse 2 points    Screening Tests Health Maintenance  Topic Date Due  . Hepatitis C Screening  [birth year]  . INFLUENZA VACCINE  03/16/2020 (Originally 07/18/2019)  . DEXA SCAN  01/24/2021 (Originally 04/07/2016)  . PNA vac Low Risk Adult (2 of 2 - PCV13) 01/24/2021 (Originally 10/17/2017)  . MAMMOGRAM  05/06/2020  . COLONOSCOPY  05/08/2020  . TETANUS/TDAP  06/10/2022     Plan:   Keep all routine maintenance appointments.   Follow up with your doctor on 01/29/20 @ 10:00  Medicare Attestation I have personally reviewed: The patient's medical and social history Their use of alcohol, tobacco or illicit drugs Their current medications and supplements The patient's functional ability including ADLs,fall risks, home safety risks, cognitive, and hearing and visual impairment Diet and physical activities Evidence for depression   I have reviewed and discussed with patient certain preventive protocols, quality metrics, and best practice recommendations.      Varney Biles, LPN   [id number]

## 2020-01-29 ENCOUNTER — Ambulatory Visit (INDEPENDENT_AMBULATORY_CARE_PROVIDER_SITE_OTHER): Payer: Medicare HMO | Admitting: Family Medicine

## 2020-01-29 ENCOUNTER — Other Ambulatory Visit: Payer: Self-pay

## 2020-01-29 ENCOUNTER — Encounter: Payer: Self-pay | Admitting: Family Medicine

## 2020-01-29 VITALS — Ht 73.0 in | Wt 203.0 lb

## 2020-01-29 DIAGNOSIS — G8929 Other chronic pain: Secondary | ICD-10-CM

## 2020-01-29 DIAGNOSIS — M21372 Foot drop, left foot: Secondary | ICD-10-CM | POA: Diagnosis not present

## 2020-01-29 DIAGNOSIS — M7989 Other specified soft tissue disorders: Secondary | ICD-10-CM

## 2020-01-29 DIAGNOSIS — I10 Essential (primary) hypertension: Secondary | ICD-10-CM

## 2020-01-29 DIAGNOSIS — M79672 Pain in left foot: Secondary | ICD-10-CM

## 2020-01-29 DIAGNOSIS — R232 Flushing: Secondary | ICD-10-CM | POA: Diagnosis not present

## 2020-01-29 DIAGNOSIS — M25512 Pain in left shoulder: Secondary | ICD-10-CM | POA: Insufficient documentation

## 2020-01-29 MED ORDER — MELOXICAM 7.5 MG PO TABS: 7.5000 mg | ORAL_TABLET | Freq: Every day | ORAL | 0 refills | Status: DC

## 2020-01-29 NOTE — Progress Notes (Addendum)
Virtual Visit via video Note  This visit type was conducted due to national recommendations for restrictions regarding the COVID-19 pandemic (e.g. social distancing).  This format is felt to be most appropriate for this patient at this time.  All issues noted in this document were discussed and addressed.  No physical exam was performed (except for noted visual exam findings with Video Visits).   I connected with Maria Hamilton today at 10:00 AM EST by a video enabled telemedicine application and verified that I am speaking with the correct person using two identifiers. Location patient: home Location provider: work  Persons participating in the virtual visit: patient, provider  I discussed the limitations, risks, security and privacy concerns of performing an evaluation and management service by telephone and the availability of in person appointments. I also discussed with the patient that there may be a patient responsible charge related to this service. The patient expressed understanding and agreed to proceed.  Reason for visit: follow-up  HPI: Leg swelling: Patient notes this resolved.  Left foot discomfort: Patient notes she has had some discomfort in her left foot for quite some time.  She notes it is cold to touch at times.  It is also darker in the dorsum.  She also describes what sounds to be possible left foot drop.  She does have a history of 5 lumbar surgeries.  Most recent MRI 2018.  She notes a lot of the symptoms have been present since she started to have neuropathy.  Hot flashes: Patient notes she started to have hot flashes after starting on the spironolactone.  Hypertension: Blood pressure 127/67 today.  She is now on amlodipine 5 mg daily and spironolactone.  Left shoulder pain: Patient notes this has been an ongoing issue.  No injury.  She thought it was arthritis to start with.  Its been nagging.  It hurts in her left trapezius with some tightness there as well.   Internal rotation bothers her as well.  She has been taking Tylenol PM up to 6 pills nightly.  She does not take 6 pills at once and spreads them out.  No numbness in her left upper extremity.   ROS: See pertinent positives and negatives per HPI.  Past Medical History:  Diagnosis Date  . Acute anxiety 08/03/2015   Overview:  Last Assessment & Plan:  Klonopin most helpful  Reports past taking it 1 tab am 1/2 tab midday and 1 tab pm  30 day supply faxed to pharmacy  . Acute bronchitis 12/31/2016  . Acute pancreatitis 10/14/2016  . Altered mental status 10/12/2016  . Anxiety   . Bronchitis   . Chronic hip pain   . Chronic pain syndrome   . Depression   . Edema   . Fatigue   . Fibromyalgia   . GERD (gastroesophageal reflux disease)   . Headache   . Hypertension   . Insomnia   . Low back pain   . Right upper quadrant abdominal pain   . Vitamin D deficiency     Past Surgical History:  Procedure Laterality Date  . ABDOMINAL HYSTERECTOMY    . BACK SURGERY    . BREAST BIOPSY Left 90s   benign  . BREAST SURGERY    . CHOLECYSTECTOMY N/A 10/16/2016   Procedure: LAPAROSCOPIC CHOLECYSTECTOMY WITH INTRAOPERATIVE CHOLANGIOGRAM;  Surgeon: Dia Crawford III, MD;  Location: [facility] ORS;  Service: General;  Laterality: N/A;  . cyst removal from wrist    . ECTOPIC PREGNANCY SURGERY    .  HIP SURGERY     x4  . OOPHORECTOMY      Family History  Problem Relation Age of Onset  . Heart disease Mother   . Cancer Father   . Alcohol abuse Father     SOCIAL HX: Non-smoker   Current Outpatient Medications:  .  amLODipine (NORVASC) 5 MG tablet, Take 1 tablet (5 mg total) by mouth daily., Disp: , Rfl:  .  calcium carbonate (OSCAL) 1500 (600 Ca) MG TABS tablet, Take 600 mg of elemental calcium by mouth daily with breakfast., Disp: , Rfl:  .  cholecalciferol (VITAMIN D) 1000 units tablet, Take 1,000 Units by mouth daily., Disp: , Rfl:  .  escitalopram (LEXAPRO) 10 MG tablet, TAKE 1 TABLET EVERY DAY,  Disp: 90 tablet, Rfl: 1 .  gabapentin (NEURONTIN) 600 MG tablet, TAKE 1 TABLET AT BEDTIME, Disp: 90 tablet, Rfl: 1 .  potassium chloride SA (KLOR-CON) 20 MEQ tablet, TAKE 1 TABLET EVERY DAY, Disp: 90 tablet, Rfl: 1 .  rosuvastatin (CRESTOR) 20 MG tablet, Take 1 tablet (20 mg total) by mouth daily., Disp: 90 tablet, Rfl: 3 .  spironolactone (ALDACTONE) 25 MG tablet, Take 0.5 tablets (12.5 mg total) by mouth daily. In am, Disp: 90 tablet, Rfl: 0 .  tiZANidine (ZANAFLEX) 2 MG tablet, Take 1 tablet (2 mg total) by mouth at bedtime., Disp: 30 tablet, Rfl: 1 .  carvedilol (COREG) 6.25 MG tablet, Take 1 tablet (6.25 mg total) by mouth 2 (two) times daily with a meal., Disp: 60 tablet, Rfl: 3 .  meloxicam (MOBIC) 7.5 MG tablet, Take 1 tablet (7.5 mg total) by mouth daily., Disp: 30 tablet, Rfl: 0  EXAM:  VITALS per patient if applicable:  GENERAL: alert, oriented, appears well and in no acute distress  HEENT: atraumatic, conjunttiva clear, no obvious abnormalities on inspection of external nose and ears  NECK: normal movements of the head and neck  LUNGS: on inspection no signs of respiratory distress, breathing rate appears normal, no obvious gross SOB, gasping or wheezing  CV: no obvious cyanosis  MS: moves all visible extremities without noticeable abnormality  PSYCH/NEURO: pleasant and cooperative, no obvious depression or anxiety, speech and thought processing grossly intact  ASSESSMENT AND PLAN:  Discussed the following assessment and plan:  Hypertension Adequate control.  Continue current regimen for now.  She will come in for lab work.  Hot flashes Started after starting on spironolactone.  I will check with her clinical pharmacist on this.  We may need to change the spironolactone to an alternative medication.  Leg swelling Resolved.  Possibly related to amlodipine.  Left foot pain Regular check ABIs given her report that her left foot feels cold to touch at times.  Also  discussed MRI given possible left foot drop.  This will be ordered.  Left shoulder pain Trial of meloxicam.  Advised to take with food and if this irritates her stomach she needs to let us know.  Likely muscular given trapezius discomfort.  If not improving she will let us know.   I forgot to ask the patient whether or not she had a pacemaker or any metal in her body prior to ordering the MRI.  I will have my CMA contact her to find these out and then place an order.  Orders Placed This Encounter  Procedures  . US ARTERIAL ABI (SCREENING LOWER EXTREMITY)    Standing Status:   Future    Number of Occurrences:   1    Standing Expiration  Date:   03/28/2021    Order Specific Question:   Reason for Exam (SYMPTOM  OR DIAGNOSIS REQUIRED)    Answer:   left foot pain, lower leg pain as well, foot cool to touch intermittently    Order Specific Question:   Preferred imaging location?    Answer:   [facility]  . MR Lumbar Spine Wo Contrast    Standing Status:   Future    Standing Expiration Date:   04/03/2021    Order Specific Question:   ** REASON FOR EXAM (FREE TEXT)    Answer:   left foot drop, history of multiple low back surgeries    Order Specific Question:   What is the patient's sedation requirement?    Answer:   No Sedation    Order Specific Question:   Does the patient have a pacemaker or implanted devices?    Answer:   No    Order Specific Question:   Preferred imaging location?    Answer:   [facility] (table limit-400lbs)    Order Specific Question:   Radiology Contrast Protocol - do NOT remove file path    Answer:   \\[email or web address]  . Basic Metabolic Panel (BMET)    Standing Status:   Future    Standing Expiration Date:   01/28/2021    Meds ordered this encounter  Medications  . meloxicam (MOBIC) 7.5 MG tablet    Sig: Take 1 tablet (7.5 mg total) by mouth daily.    Dispense:  30 tablet    Refill:  0   Health maintenance: Patient is  due for mammogram and colonoscopy this year.  She is status post total hysterectomy.   I discussed the assessment and treatment plan with the patient. The patient was provided an opportunity to ask questions and all were answered. The patient agreed with the plan and demonstrated an understanding of the instructions.   The patient was advised to call back or seek an in-person evaluation if the symptoms worsen or if the condition fails to improve as anticipated.   [name], MD

## 2020-01-29 NOTE — Assessment & Plan Note (Signed)
Regular check ABIs given her report that her left foot feels cold to touch at times.  Also discussed MRI given possible left foot drop.  This will be ordered.

## 2020-01-29 NOTE — Assessment & Plan Note (Signed)
Resolved.  Possibly related to amlodipine.

## 2020-01-29 NOTE — Assessment & Plan Note (Addendum)
Trial of meloxicam.  Advised to take with food and if this irritates her stomach she needs to let us know.  Likely muscular given trapezius discomfort.  If not improving she will let us know.

## 2020-01-29 NOTE — Assessment & Plan Note (Signed)
Adequate control.  Continue current regimen for now.  She will come in for lab work.

## 2020-01-29 NOTE — Assessment & Plan Note (Signed)
Started after starting on spironolactone.  I will check with her clinical pharmacist on this.  We may need to change the spironolactone to an alternative medication.

## 2020-01-31 ENCOUNTER — Telehealth: Payer: Self-pay | Admitting: Family Medicine

## 2020-01-31 NOTE — Telephone Encounter (Signed)
-----   Message from De Hollingshead, [facility] sent at 01/29/2020  4:41 PM EST ----- Hmm. I've never seen, but possible.   Could try eplerenone instead? Less incidence of side effects. Could also just try triamterene/HCTZ - she appears to have a hx of hypokalemia w/ HCTZ, but triamterene could help balance that.   ----- Message ----- From: Leone Haven, MD Sent: 01/29/2020  12:09 PM EST To: De Hollingshead, [place],   This patient has been having hot flashes since starting on spironolactone. I looked it up and did not see this as a side effect, though wanted to see if you had seen this previously? Thanks.   Randall Hiss

## 2020-01-31 NOTE — Telephone Encounter (Signed)
Please let the patient know I heard back from the clinical pharmacist.  She noted that she had not heard of hot flashes as a side effect of spironolactone.  Please see if the patient is continuing to have hot flashes.  If she has continued to have them we could consider using a different medication.

## 2020-02-01 NOTE — Telephone Encounter (Signed)
I called and spoke with the patient and she stated that she is still having the hot flashed and they are awful.  I also informed her that you would be considering trying something different and I would call her back to let her know what that medication would be.  Thierry Dobosz,cma

## 2020-02-02 MED ORDER — CARVEDILOL 6.25 MG PO TABS: 6.2500 mg | ORAL_TABLET | Freq: Two times a day (BID) | ORAL | 3 refills | Status: DC

## 2020-02-02 NOTE — Telephone Encounter (Signed)
I called and spoke with the patient and she checked her pulse while we were on the phone and her pulse is 68 today.  Arilla Hice,cma

## 2020-02-02 NOTE — Telephone Encounter (Signed)
Noted.  Carvedilol sent to pharmacy locally.  She needs a BP check in 1 week with a pulse check at the same time.  She needs to monitor her pulse and if it drops less than 60 consistently she needs to let us know.  Thanks.

## 2020-02-02 NOTE — Telephone Encounter (Signed)
Noted. We will stop the spironolactone. Please see if she has a way to check her pulse at home and if she does see if she can check it and get [place] a reading. I would like to consider a beta blocker such as coreg though need to know her pulse first. Thanks.

## 2020-02-02 NOTE — Addendum Note (Signed)
Addended by: Leone Haven on: 02/02/2020 04:52 PM   Modules accepted: Orders

## 2020-02-02 NOTE — Progress Notes (Signed)
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  Lesia Hausen

## 2020-02-03 ENCOUNTER — Ambulatory Visit
Admission: RE | Admit: 2020-02-03 | Discharge: 2020-02-03 | Disposition: A | Discharge status: Home / Self Care | Payer: Medicare HMO | Source: Ambulatory Visit | Attending: Family Medicine | Admitting: Family Medicine

## 2020-02-03 ENCOUNTER — Other Ambulatory Visit: Payer: Self-pay

## 2020-02-03 DIAGNOSIS — M79672 Pain in left foot: Secondary | ICD-10-CM | POA: Insufficient documentation

## 2020-02-03 DIAGNOSIS — M79662 Pain in left lower leg: Secondary | ICD-10-CM | POA: Diagnosis not present

## 2020-02-03 NOTE — Telephone Encounter (Signed)
I called and spoke with the patient and informed her to send her BP and pulse through mychart, one week after starting the medication and if her pulse drops below 60 to let us know, patient understood.  Malyia Moro,cma

## 2020-02-04 NOTE — Addendum Note (Signed)
Addended by: Caryl Bis Keamber Macfadden G on: 02/04/2020 01:41 PM   Modules accepted: Orders

## 2020-02-05 ENCOUNTER — Other Ambulatory Visit: Payer: Self-pay

## 2020-02-05 ENCOUNTER — Other Ambulatory Visit (INDEPENDENT_AMBULATORY_CARE_PROVIDER_SITE_OTHER): Payer: Medicare HMO

## 2020-02-05 DIAGNOSIS — I10 Essential (primary) hypertension: Secondary | ICD-10-CM

## 2020-02-05 DIAGNOSIS — E7800 Pure hypercholesterolemia, unspecified: Secondary | ICD-10-CM

## 2020-02-05 DIAGNOSIS — Z1159 Encounter for screening for other viral diseases: Secondary | ICD-10-CM

## 2020-02-05 NOTE — Addendum Note (Signed)
Addended by: Leeanne Rio on: 02/05/2020 01:16 PM   Modules accepted: Orders

## 2020-02-08 ENCOUNTER — Telehealth: Payer: Self-pay | Admitting: Family Medicine

## 2020-02-08 LAB — BASIC METABOLIC PANEL
BUN/Creatinine Ratio: 14 (calc) (ref 6–22)
BUN: 14 mg/dL (ref 7–25)
CO2: 27 mmol/L (ref 20–32)
Calcium: 9.7 mg/dL (ref 8.6–10.4)
Chloride: 103 mmol/L (ref 98–110)
Creat: 1 mg/dL — ABNORMAL HIGH (ref 0.50–0.99)
Glucose, Bld: 77 mg/dL (ref 65–99)
Potassium: 4.8 mmol/L (ref 3.5–5.3)
Sodium: 139 mmol/L (ref 135–146)

## 2020-02-08 LAB — HEPATIC FUNCTION PANEL
AG Ratio: 1.2 (calc) (ref 1.0–2.5)
ALT: 11 U/L (ref 6–29)
AST: 16 U/L (ref 10–35)
Albumin: 4 g/dL (ref 3.6–5.1)
Alkaline phosphatase (APISO): 104 U/L (ref 37–153)
Bilirubin, Direct: 0.1 mg/dL (ref 0.0–0.2)
Globulin: 3.4 g/dL (calc) (ref 1.9–3.7)
Indirect Bilirubin: 0.1 mg/dL (calc) — ABNORMAL LOW (ref 0.2–1.2)
Total Bilirubin: 0.2 mg/dL (ref 0.2–1.2)
Total Protein: 7.4 g/dL (ref 6.1–8.1)

## 2020-02-08 LAB — HEPATITIS C ANTIBODY
Hepatitis C Ab: NONREACTIVE
SIGNAL TO CUT-OFF: 0.01 (ref <1.00)

## 2020-02-08 LAB — LDL CHOLESTEROL, DIRECT: Direct LDL: 55 mg/dL (ref <100)

## 2020-02-08 NOTE — Telephone Encounter (Signed)
Pt called stating that the meloxicam (MOBIC) 7.5 MG tablet is not working for her shoulder and would like something else called in

## 2020-02-08 NOTE — Telephone Encounter (Signed)
Pt called stating that the meloxicam (MOBIC) 7.5 MG tablet is not working for her shoulder and would like something else called In.     Marji Kuehnel,cma

## 2020-02-09 NOTE — Telephone Encounter (Signed)
I called and informed the patient to take 2 of the meloxicam tablets and let us know if this helps, patient understood.  Andi Mahaffy,cma

## 2020-02-09 NOTE — Telephone Encounter (Signed)
She could try taking meloxicam 15 mg once daily. This would be two of the 7.5 mg tablets. If this is not helpful she should let us know.

## 2020-02-11 ENCOUNTER — Telehealth: Payer: Self-pay

## 2020-02-11 ENCOUNTER — Telehealth: Payer: Self-pay | Admitting: Family Medicine

## 2020-02-11 DIAGNOSIS — N179 Acute kidney failure, unspecified: Secondary | ICD-10-CM

## 2020-02-11 DIAGNOSIS — G8929 Other chronic pain: Secondary | ICD-10-CM

## 2020-02-11 DIAGNOSIS — M25512 Pain in left shoulder: Secondary | ICD-10-CM

## 2020-02-11 NOTE — Telephone Encounter (Signed)
Stop mobic  She may want to try trial of voltaren gel with PCP and consider ortho referral   BP is also elevated as well which could make her dizzy so this needs to be addressed with PCP

## 2020-02-11 NOTE — Telephone Encounter (Signed)
Pt states when took the medication for her shoulder the second pill it made her dizzy. BP yesterday 155/66. Please advise and Thank you!

## 2020-02-11 NOTE — Telephone Encounter (Signed)
-----   Message from Leone Haven, MD sent at 02/07/2020  8:11 AM EST ----- Please let the patient know that her kidney function is mildly worse compared to previously.  Please see if she has been taking any ibuprofen or Aleve.  Please see how much water she has been drinking.  I would like to recheck this in 1 to 2 weeks.  Please place an order for a BMP for AKI.  Thanks.

## 2020-02-11 NOTE — Telephone Encounter (Signed)
Provider is not working today and this Pt states when took the medication for her shoulder the second pill it made her dizzy. BP yesterday 155/66. The patient is speaking of the Meloxicam and the Sig states to take 1 per day and she stated that the provider informed her to take 2 tablets because one was not working. I informed her to go back to taking the one pill daily until I spoke to someone.    Please advise and Thank you!

## 2020-02-12 NOTE — Telephone Encounter (Signed)
Referral placed.

## 2020-02-12 NOTE — Telephone Encounter (Signed)
I agree with stopping Mobic.  Please have her check her blood pressure today and let us know what it is running.  I would like to refer her to orthopedics to evaluate her shoulder.

## 2020-02-12 NOTE — Telephone Encounter (Signed)
I called and spoke with the patient and informed her to stop taking the Mobic, she checked her BP while we were on the call and it was 125/77 pulse 62 and she had not taking any medication.

## 2020-02-18 ENCOUNTER — Telehealth: Payer: Self-pay

## 2020-02-18 DIAGNOSIS — N179 Acute kidney failure, unspecified: Secondary | ICD-10-CM

## 2020-02-18 NOTE — Telephone Encounter (Signed)
Please place future lab orders.   

## 2020-02-19 NOTE — Telephone Encounter (Signed)
Ordered

## 2020-02-19 NOTE — Addendum Note (Signed)
Addended by: Caryl Bis, Georgianne Gritz G on: 02/19/2020 12:52 PM   Modules accepted: Orders

## 2020-02-20 ENCOUNTER — Ambulatory Visit
Admission: RE | Admit: 2020-02-20 | Discharge: 2020-02-20 | Disposition: A | Discharge status: Home / Self Care | Payer: Medicare HMO | Source: Ambulatory Visit | Attending: Family Medicine | Admitting: Family Medicine

## 2020-02-20 ENCOUNTER — Other Ambulatory Visit: Payer: Self-pay

## 2020-02-20 DIAGNOSIS — M21372 Foot drop, left foot: Secondary | ICD-10-CM | POA: Diagnosis not present

## 2020-02-20 DIAGNOSIS — M48061 Spinal stenosis, lumbar region without neurogenic claudication: Secondary | ICD-10-CM | POA: Diagnosis not present

## 2020-02-22 ENCOUNTER — Other Ambulatory Visit: Payer: Self-pay

## 2020-02-22 ENCOUNTER — Other Ambulatory Visit (INDEPENDENT_AMBULATORY_CARE_PROVIDER_SITE_OTHER): Payer: Medicare HMO

## 2020-02-22 ENCOUNTER — Other Ambulatory Visit: Payer: Medicare HMO

## 2020-02-22 DIAGNOSIS — N179 Acute kidney failure, unspecified: Secondary | ICD-10-CM | POA: Diagnosis not present

## 2020-02-22 LAB — BASIC METABOLIC PANEL
BUN: 12 mg/dL (ref 6–23)
CO2: 29 mEq/L (ref 19–32)
Calcium: 9.2 mg/dL (ref 8.4–10.5)
Chloride: 104 mEq/L (ref 96–112)
Creatinine, Ser: 0.92 mg/dL (ref 0.40–1.20)
GFR: 73.26 mL/min (ref >60.00)
Glucose, Bld: 74 mg/dL (ref 70–99)
Potassium: 3.5 mEq/L (ref 3.5–5.1)
Sodium: 139 mEq/L (ref 135–145)

## 2020-02-27 ENCOUNTER — Other Ambulatory Visit: Payer: Self-pay | Admitting: Family Medicine

## 2020-02-28 ENCOUNTER — Other Ambulatory Visit: Payer: Self-pay | Admitting: Family Medicine

## 2020-02-28 DIAGNOSIS — M5136 Other intervertebral disc degeneration, lumbar region: Secondary | ICD-10-CM

## 2020-03-02 NOTE — Telephone Encounter (Signed)
Patient should stop this medication given her recent increase in creatinine. I would suggest checking her kidney function 2-3 weeks after stopping this medication to see if it improves to her baseline. Thanks.

## 2020-03-08 DIAGNOSIS — G894 Chronic pain syndrome: Secondary | ICD-10-CM | POA: Diagnosis not present

## 2020-03-15 ENCOUNTER — Other Ambulatory Visit: Payer: Self-pay

## 2020-03-15 MED ORDER — TIZANIDINE HCL 2 MG PO TABS: 2.0000 mg | ORAL_TABLET | Freq: Every day | ORAL | 1 refills | Status: DC

## 2020-03-17 ENCOUNTER — Other Ambulatory Visit: Payer: Self-pay

## 2020-03-17 NOTE — Telephone Encounter (Signed)
I have d/c from patient's chart. She stated that all she takes is the tizanidine & tylenol PM. Meloxicam made her dizzy.

## 2020-05-02 ENCOUNTER — Other Ambulatory Visit: Payer: Self-pay

## 2020-05-02 ENCOUNTER — Ambulatory Visit (INDEPENDENT_AMBULATORY_CARE_PROVIDER_SITE_OTHER): Payer: Medicare HMO | Admitting: Family Medicine

## 2020-05-02 ENCOUNTER — Encounter: Payer: Self-pay | Admitting: Family Medicine

## 2020-05-02 VITALS — BP 134/84 | HR 72 | Temp 96.3°F | Ht 72.99 in | Wt 201.4 lb

## 2020-05-02 DIAGNOSIS — R252 Cramp and spasm: Secondary | ICD-10-CM | POA: Diagnosis not present

## 2020-05-02 DIAGNOSIS — M5442 Lumbago with sciatica, left side: Secondary | ICD-10-CM

## 2020-05-02 DIAGNOSIS — W19XXXA Unspecified fall, initial encounter: Secondary | ICD-10-CM | POA: Insufficient documentation

## 2020-05-02 DIAGNOSIS — G8929 Other chronic pain: Secondary | ICD-10-CM

## 2020-05-02 DIAGNOSIS — I10 Essential (primary) hypertension: Secondary | ICD-10-CM

## 2020-05-02 MED ORDER — CARVEDILOL 12.5 MG PO TABS: 12.5000 mg | ORAL_TABLET | Freq: Two times a day (BID) | ORAL | 1 refills | Status: DC

## 2020-05-02 NOTE — Progress Notes (Signed)
Maria Rumps, MD Phone: [phone]  Maria Hamilton is a 69 y.o. female who presents today for f/u.  HYPERTENSION  Disease Monitoring  Home BP Monitoring not checking consistently Chest pain- no    Dyspnea- no Medications  Compliance-  Taking amlodipine, spironolactone, coreg.  Edema- no  Chronic low back pain: Patient saw the neurosurgeon in the did not feel as though there is a surgical intervention that was worth the risk at this time.  They referred her back to pain management who she sees later this week.  They mention a possible spinal cord stimulator.  She continues to have left leg issues.  She does note some cramps in her left legs.  She does not drink much water.  Falls: Notes she has fallen twice with no injury.  1 time she was on a stepladder putting up blinds.  She does not recall exactly what she was doing the second time she fell.  No injury either time.    Social History   Tobacco Use  Smoking Status Never Smoker  Smokeless Tobacco Never Used     ROS see history of present illness  Objective  Physical Exam Vitals:   05/02/20 1337 05/02/20 1358  BP: 138/74 134/84  Pulse: 72   Temp: (!) 96.3 F (35.7 C)   SpO2: 99%     BP Readings from Last 3 Encounters:  05/02/20 134/84  01/12/20 (!) 158/89  08/07/19 140/80   Wt Readings from Last 3 Encounters:  05/02/20 201 lb 6.4 oz (91.4 kg)  01/29/20 203 lb (92.1 kg)  01/25/20 203 lb (92.1 kg)    Physical Exam Constitutional:      General: She is not in acute distress.    Appearance: She is not diaphoretic.  Cardiovascular:     Rate and Rhythm: Normal rate and regular rhythm.     Heart sounds: Normal heart sounds.  Pulmonary:     Effort: Pulmonary effort is normal.     Breath sounds: Normal breath sounds.  Musculoskeletal:     Right lower leg: No edema.     Left lower leg: No edema.     Comments: Mild low back muscular tenderness, no midline spine tenderness, no midline spine step-off  Skin:    General: Skin is warm and dry.  Neurological:     Mental Status: She is alert.     Comments: 5/5 strength bilateral quads, hamstrings, plantar flexion, and dorsiflexion, sensation light touch intact bilateral lower extremities      Assessment/Plan: Please see individual problem list.  Chronic low back pain (Primary Source of Pain) (Left) Chronic issue.  Continues to have pain.  She has seen the neurosurgeon and they advised against any surgery at this time.  She will see the pain specialist later this week.  I discussed that I have seen spinal cord stimulator is to be quite effective though I have also seen them not work or cause additional issues.  I encouraged her to discuss this option with the pain specialist.  Falls Discussed falls precautions.  Discussed being extremely careful when she is on a stepstool or ladder.  Hypertension Slightly elevated even on recheck.  We will increase her carvedilol to 12.5 mg twice daily.  She will continue her spironolactone and amlodipine.  Return in 2 weeks for BP check with nursing.  Muscle cramps Could be related to her back.  Encouraged hydration as she does not drink very much water.  We will check labs to rule out electrolyte abnormalities.  Health Maintenance: patient declines the COVID19 vaccine.   Orders Placed This Encounter  Procedures  . Basic Metabolic Panel (BMET)  . Magnesium    Meds ordered this encounter  Medications  . carvedilol (COREG) 12.5 MG tablet    Sig: Take 1 tablet (12.5 mg total) by mouth 2 (two) times daily with a meal.    Dispense:  180 tablet    Refill:  1    This visit occurred during the SARS-CoV-2 public health emergency.  Safety protocols were in place, including screening questions prior to the visit, additional usage of staff PPE, and extensive cleaning of exam room while observing appropriate contact time as indicated for disinfecting solutions.    Maria Rumps, MD [place]

## 2020-05-02 NOTE — Assessment & Plan Note (Signed)
Could be related to her back.  Encouraged hydration as she does not drink very much water.  We will check labs to rule out electrolyte abnormalities.

## 2020-05-02 NOTE — Patient Instructions (Signed)
Nice to see you. We are going to increase your carvedilol dose to 12.5 mg twice daily.  Please continue your amlodipine and spironolactone. We will check labs and contact you with the results.

## 2020-05-02 NOTE — Assessment & Plan Note (Signed)
Discussed falls precautions.  Discussed being extremely careful when she is on a stepstool or ladder.

## 2020-05-02 NOTE — Assessment & Plan Note (Signed)
Slightly elevated even on recheck.  We will increase her carvedilol to 12.5 mg twice daily.  She will continue her spironolactone and amlodipine.  Return in 2 weeks for BP check with nursing.

## 2020-05-02 NOTE — Assessment & Plan Note (Signed)
Chronic issue.  Continues to have pain.  She has seen the neurosurgeon and they advised against any surgery at this time.  She will see the pain specialist later this week.  I discussed that I have seen spinal cord stimulator is to be quite effective though I have also seen them not work or cause additional issues.  I encouraged her to discuss this option with the pain specialist.

## 2020-05-03 LAB — BASIC METABOLIC PANEL
BUN: 16 mg/dL (ref 6–23)
CO2: 29 mEq/L (ref 19–32)
Calcium: 9.4 mg/dL (ref 8.4–10.5)
Chloride: 103 mEq/L (ref 96–112)
Creatinine, Ser: 0.96 mg/dL (ref 0.40–1.20)
GFR: 69.71 mL/min (ref >60.00)
Glucose, Bld: 83 mg/dL (ref 70–99)
Potassium: 4.4 mEq/L (ref 3.5–5.1)
Sodium: 137 mEq/L (ref 135–145)

## 2020-05-03 LAB — MAGNESIUM: Magnesium: 2 mg/dL (ref 1.5–2.5)

## 2020-05-04 NOTE — Progress Notes (Signed)
Appointment canceled due to the flu.

## 2020-05-05 ENCOUNTER — Encounter: Payer: Medicare HMO | Admitting: Pain Medicine

## 2020-05-09 DIAGNOSIS — H5213 Myopia, bilateral: Secondary | ICD-10-CM | POA: Diagnosis not present

## 2020-05-10 ENCOUNTER — Other Ambulatory Visit: Payer: Self-pay | Admitting: Family Medicine

## 2020-06-08 ENCOUNTER — Other Ambulatory Visit: Payer: Self-pay | Admitting: Family Medicine

## 2020-06-18 ENCOUNTER — Other Ambulatory Visit: Payer: Self-pay

## 2020-06-18 ENCOUNTER — Emergency Department
Admission: EM | Admit: 2020-06-18 | Discharge: 2020-06-19 | Disposition: A | Discharge status: Home / Self Care | Payer: Medicare HMO | Attending: Emergency Medicine | Admitting: Emergency Medicine

## 2020-06-18 ENCOUNTER — Emergency Department: Payer: Medicare HMO

## 2020-06-18 DIAGNOSIS — M4316 Spondylolisthesis, lumbar region: Secondary | ICD-10-CM | POA: Diagnosis not present

## 2020-06-18 DIAGNOSIS — Y999 Unspecified external cause status: Secondary | ICD-10-CM | POA: Insufficient documentation

## 2020-06-18 DIAGNOSIS — S299XXA Unspecified injury of thorax, initial encounter: Secondary | ICD-10-CM | POA: Diagnosis not present

## 2020-06-18 DIAGNOSIS — S0990XA Unspecified injury of head, initial encounter: Secondary | ICD-10-CM | POA: Insufficient documentation

## 2020-06-18 DIAGNOSIS — Y939 Activity, unspecified: Secondary | ICD-10-CM | POA: Insufficient documentation

## 2020-06-18 DIAGNOSIS — J3489 Other specified disorders of nose and nasal sinuses: Secondary | ICD-10-CM | POA: Diagnosis not present

## 2020-06-18 DIAGNOSIS — S199XXA Unspecified injury of neck, initial encounter: Secondary | ICD-10-CM | POA: Diagnosis not present

## 2020-06-18 DIAGNOSIS — M546 Pain in thoracic spine: Secondary | ICD-10-CM | POA: Diagnosis not present

## 2020-06-18 DIAGNOSIS — Z981 Arthrodesis status: Secondary | ICD-10-CM | POA: Insufficient documentation

## 2020-06-18 DIAGNOSIS — Z96643 Presence of artificial hip joint, bilateral: Secondary | ICD-10-CM | POA: Insufficient documentation

## 2020-06-18 DIAGNOSIS — S20229A Contusion of unspecified back wall of thorax, initial encounter: Secondary | ICD-10-CM

## 2020-06-18 DIAGNOSIS — M5489 Other dorsalgia: Secondary | ICD-10-CM | POA: Diagnosis not present

## 2020-06-18 DIAGNOSIS — R52 Pain, unspecified: Secondary | ICD-10-CM | POA: Diagnosis not present

## 2020-06-18 DIAGNOSIS — I10 Essential (primary) hypertension: Secondary | ICD-10-CM | POA: Diagnosis not present

## 2020-06-18 DIAGNOSIS — W19XXXA Unspecified fall, initial encounter: Secondary | ICD-10-CM | POA: Diagnosis not present

## 2020-06-18 DIAGNOSIS — R519 Headache, unspecified: Secondary | ICD-10-CM | POA: Diagnosis not present

## 2020-06-18 DIAGNOSIS — S300XXA Contusion of lower back and pelvis, initial encounter: Secondary | ICD-10-CM | POA: Diagnosis not present

## 2020-06-18 DIAGNOSIS — M2578 Osteophyte, vertebrae: Secondary | ICD-10-CM | POA: Diagnosis not present

## 2020-06-18 DIAGNOSIS — Y929 Unspecified place or not applicable: Secondary | ICD-10-CM | POA: Diagnosis not present

## 2020-06-18 DIAGNOSIS — S3992XA Unspecified injury of lower back, initial encounter: Secondary | ICD-10-CM | POA: Diagnosis not present

## 2020-06-18 DIAGNOSIS — M47814 Spondylosis without myelopathy or radiculopathy, thoracic region: Secondary | ICD-10-CM | POA: Diagnosis not present

## 2020-06-18 DIAGNOSIS — W108XXA Fall (on) (from) other stairs and steps, initial encounter: Secondary | ICD-10-CM | POA: Diagnosis not present

## 2020-06-18 DIAGNOSIS — I700 Atherosclerosis of aorta: Secondary | ICD-10-CM | POA: Diagnosis not present

## 2020-06-18 MED ORDER — OXYCODONE-ACETAMINOPHEN 5-325 MG PO TABS: 1.0000 | ORAL_TABLET | Freq: Four times a day (QID) | ORAL | 0 refills | Status: AC | PRN

## 2020-06-18 MED ORDER — HYDROMORPHONE HCL 1 MG/ML IJ SOLN
1.0000 mg | Freq: Once | INTRAMUSCULAR | Status: AC
  Administered 2020-06-18: 1 mg via INTRAMUSCULAR
  Filled 2020-06-18: qty 1

## 2020-06-18 NOTE — ED Provider Notes (Signed)
[facility] Emergency Department Provider Note ____________________________________________   First MD Initiated Contact with Patient 06/18/20 2117     (approximate)  I have reviewed the triage vital signs and the nursing notes.   HISTORY  Chief Complaint Fall    HPI Maria Hamilton is a 69 y.o. female with PMH as noted below who presents with back pain after a fall from 3 steps.  The patient states that she leaned backwards against a railing which gave way, and then fell onto her back.  She reports pain from the base of her neck going all the way down her back.  She states she hit her head but did not lose consciousness.  She has a bit of a headache but has no vomiting.  She denies any hip or knee pain or other extremity pain.  Past Medical History:  Diagnosis Date  . Acute anxiety 08/03/2015   Overview:  Last Assessment & Plan:  Klonopin most helpful  Reports past taking it 1 tab am 1/2 tab midday and 1 tab pm  30 day supply faxed to pharmacy  . Acute bronchitis 12/31/2016  . Acute pancreatitis 10/14/2016  . Altered mental status 10/12/2016  . Anxiety   . Bronchitis   . Chronic hip pain   . Chronic pain syndrome   . Depression   . Edema   . Fatigue   . Fibromyalgia   . GERD (gastroesophageal reflux disease)   . Headache   . Hypertension   . Insomnia   . Low back pain   . Right upper quadrant abdominal pain   . Vitamin D deficiency     Patient Active Problem List   Diagnosis Date Noted  . Falls 05/02/2020  . Muscle cramps 05/02/2020  . Hot flashes 01/29/2020  . Leg swelling 01/29/2020  . Left foot pain 01/29/2020  . Left shoulder pain 01/29/2020  . Leg edema 01/12/2020  . Toe swelling 08/07/2019  . Numbness and tingling of foot 11/19/2018  . Hot flashes due to menopause 11/19/2018  . Spondylosis without myelopathy or radiculopathy, lumbosacral region 04/28/2018  . Chronic hip pain after total replacement of hip joint (Left) 04/28/2018    . Chronic hip pain after total replacement of hip joint (Right) 04/28/2018  . Chronic pain of hip  (Bilateral) (L>R) 04/28/2018  . Chronic sacroiliac joint pain (Bilateral) (L>R) 04/28/2018  . Lumbar facet syndrome (Bilateral) (L>R) 04/28/2018  . Vitamin B 12 deficiency 04/02/2018  . Pain of both shoulder joints 04/02/2018  . Degeneration of lumbar intervertebral disc 10/30/2017  . Osteoarthritis of hip 10/30/2017  . History of allergy to radiographic contrast media 10/29/2017  . Ankle pain 10/16/2017  . Anxiety and depression 10/16/2017  . Viral URI with cough 10/16/2017  . RLS (restless legs syndrome) 10/10/2017  . Night sweats 08/13/2017  . Chronic foot numbness (Left) 04/02/2017  . Vertigo 03/25/2017  . 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) 03/20/2017  . History of lumbar fusion (L3-4, L4-5, and L5-S1) 03/20/2017  . Post herpetic neuralgia 03/12/2017  . Abnormal MRI, lumbar spine (03/06/2017) 03/12/2017  . Memory difficulty 03/06/2017  . Polyneuropathy 03/06/2017  . Palpitations 11/26/2016  . Epigastric pain 11/26/2016  . Therapeutic opioid-induced constipation (OIC) 11/20/2016  . Constipation   . Hypokalemia 09/27/2016  . Long term current use of opiate analgesic 09/24/2016  . Long term prescription opiate use 09/24/2016  . Opiate use 09/24/2016  . Encounter for therapeutic drug level monitoring 09/24/2016  . Encounter for  pain management planning 09/24/2016  . Rheumatoid arthritis, multiple sites (positive RF) 09/24/2016  . Chronic lower extremity pain (Secondary source of pain) (Left) 09/24/2016  . Lumbar facet arthropathy (Bilateral) 09/24/2016  . Failed back surgical syndrome (x 3) 09/24/2016  . Epidural fibrosis 09/24/2016  . Neurogenic pain 09/24/2016  . Musculoskeletal pain 09/24/2016  . Chronic lumbar radicular pain (L5/S1 dermatome) (Left) 09/24/2016  . History of total hip replacement, bilateral 09/24/2016  . GERD (gastroesophageal reflux  disease) 08/03/2015  . Rheumatoid factor positive 08/03/2015  . Chronic fatigue 08/03/2015  . Vitamin D deficiency 08/03/2015  . Insomnia 08/03/2015  . Fibromyalgia 08/03/2015  . Chronic low back pain (Primary Source of Pain) (Left) 08/03/2015  . Chronic pain syndrome 08/03/2015  . Depression 08/03/2015  . Hypertension 08/03/2015  . Acute anxiety 08/03/2015  . Mechanical complication of internal joint prosthesis ([place]) 04/22/2012    Past Surgical History:  Procedure Laterality Date  . ABDOMINAL HYSTERECTOMY    . BACK SURGERY    . BREAST BIOPSY Left 90s   benign  . BREAST SURGERY    . CHOLECYSTECTOMY N/A 10/16/2016   Procedure: LAPAROSCOPIC CHOLECYSTECTOMY WITH INTRAOPERATIVE CHOLANGIOGRAM;  Surgeon: Dia Crawford III, MD;  Location: [facility] ORS;  Service: General;  Laterality: N/A;  . cyst removal from wrist    . ECTOPIC PREGNANCY SURGERY    . HIP SURGERY     x4  . OOPHORECTOMY      Prior to Admission medications   Medication Sig Start Date End Date Taking? Authorizing Provider  amLODipine (NORVASC) 10 MG tablet TAKE 1 TABLET EVERY DAY 06/09/20   Leone Haven, MD  calcium carbonate (OSCAL) 1500 (600 Ca) MG TABS tablet Take 600 mg of elemental calcium by mouth daily with breakfast.    [provider]  carvedilol (COREG) 12.5 MG tablet Take 1 tablet (12.5 mg total) by mouth 2 (two) times daily with a meal. 05/02/20   Leone Haven, MD  cholecalciferol (VITAMIN D) 1000 units tablet Take 1,000 Units by mouth daily.    [provider]  escitalopram (LEXAPRO) 10 MG tablet TAKE 1 TABLET EVERY DAY 06/09/20   Leone Haven, MD  gabapentin (NEURONTIN) 600 MG tablet TAKE 1 TABLET AT BEDTIME 06/09/20   Leone Haven, MD  oxyCODONE-acetaminophen (PERCOCET) 5-325 MG tablet Take 1 tablet by mouth every 6 (six) hours as needed for up to 5 days for severe pain. 06/18/20 06/23/20  Arta Silence, MD  potassium chloride SA (KLOR-CON) 20 MEQ tablet TAKE 1 TABLET EVERY  DAY 10/27/19   Leone Haven, MD  rosuvastatin (CRESTOR) 20 MG tablet Take 1 tablet (20 mg total) by mouth daily. 08/12/19   Leone Haven, MD  spironolactone (ALDACTONE) 25 MG tablet Take 0.5 tablets (12.5 mg total) by mouth daily. In am 01/12/20   McLean-Scocuzza, Nino Glow, MD  tiZANidine (ZANAFLEX) 2 MG tablet TAKE 1 TABLET(2 MG) BY MOUTH AT BEDTIME 05/10/20   Leone Haven, MD    Allergies Ace inhibitors, Ciprofloxacin, Fentanyl, Hctz [hydrochlorothiazide], Latex, Morphine and related, Penicillin g benzathine, Isovue m [iopamidol], and Tape  Family History  Problem Relation Age of Onset  . Heart disease Mother   . Cancer Father   . Alcohol abuse Father     Social History Social History   Tobacco Use  . Smoking status: Never Smoker  . Smokeless tobacco: Never Used  Substance Use Topics  . Alcohol use: No    Alcohol/week: 0.0 standard drinks  . Drug use:  No    Review of Systems  Constitutional: No fever/chills. Eyes: No visual changes. ENT: No sore throat. Cardiovascular: Denies chest pain. Respiratory: Denies shortness of breath. Gastrointestinal: No vomiting. Genitourinary: Negative for flank pain. Musculoskeletal: Positive for back pain. Skin: Negative for rash. Neurological: Positive for headache.  Negative for weakness or numbness.   ____________________________________________   PHYSICAL EXAM:  VITAL SIGNS: ED Triage Vitals  Enc Vitals Group     BP 06/18/20 2020 (!) 159/83     Pulse Rate 06/18/20 2020 68     Resp 06/18/20 2020 20     Temp 06/18/20 2020 98.6 F (37 C)     Temp Source 06/18/20 2020 Oral     SpO2 06/18/20 2020 99 %     Weight 06/18/20 2020 201 lb (91.2 kg)     Height 06/18/20 2020 6\' 1"  (1.854 m)     Head Circumference --      Peak Flow --      Pain Score 06/18/20 2019 8     Pain Loc --      Pain Edu? --      Excl. in [place]? --     Constitutional: Alert and oriented.  Uncomfortable appearing but in no acute  distress. Eyes: Conjunctivae are normal.  Head: Atraumatic. Nose: No congestion/rhinnorhea. Mouth/Throat: Mucous membranes are moist.   Neck: Mild tenderness to the lower cervical spine. Cardiovascular: Normal rate, regular rhythm.  Good peripheral circulation. Respiratory: Normal respiratory effort.  No retractions. Lungs CTAB. Gastrointestinal: Soft and nontender. No distention.  Genitourinary: No CVA tenderness. Musculoskeletal: No lower extremity edema.  Extremities warm and well perfused.  Full range of motion of bilateral hips and knees.  Midline spinal tenderness in the thoracic and lumbar spine. Neurologic:  Normal speech and language.  5/5 motor strength and intact sensation to bilateral upper and lower extremities. Skin:  Skin is warm and dry. No rash noted. Psychiatric: Mood and affect are normal. Speech and behavior are normal.  ____________________________________________   LABS (all labs ordered are listed, but only abnormal results are displayed)  Labs Reviewed - No data to display ____________________________________________  EKG   ____________________________________________  RADIOLOGY  CT head: No ICH or other acute findings CT cervical spine: No acute fracture CT thoracic spine: T2 endplate compression deformity, age-indeterminate CT lumbar spine: No acute fracture  ____________________________________________   PROCEDURES  Procedure(s) performed: No  Procedures  Critical Care performed: No ____________________________________________   INITIAL IMPRESSION / ASSESSMENT AND PLAN / ED COURSE  Pertinent labs & imaging results that were available during my care of the patient were reviewed by me and considered in my medical decision making (see chart for details).  69 year old female with PMH as noted above presents after a fall from 3 steps.  The patient reports pain from the base of her neck down her back.  She also hit her head but had no LOC.   She is not on any anticoagulation.  I reviewed the past medical records in [place].  The patient was most recently seen in the ED for vertigo in 2019.  She has no recent admissions.  On exam, the patient is uncomfortable but overall well-appearing.  She has tenderness from the lower C-spine on down, but no step-off or crepitus.  Neurologic exam is nonfocal.  She is currently in a cervical collar.  We will obtain CT head due to the patient's age, as well as CT of the entire spine and give analgesia.  ----------------------------------------- 11:35 PM on  06/18/2020 -----------------------------------------  CT head, C-spine, and L-spine are all negative for acute findings.  CT thoracic spine shows an age-indeterminate subtle T2 compression deformity.  The patient has mild tenderness here but not necessarily more than in the rest of her back.  She also endorses a fall last year which resulted in pain in the same area, so it is not clear that this is acute.  In any case, she is moving her upper body well, has good pain control, and no neurologic symptoms.  She is stable for discharge with pain control and outpatient follow-up.  She feels comfortable and wants to go home.  I counseled her on the results of the work-up.  I will refer her to neurosurgery for follow-up.  Return precautions given, and she expresses understanding. ____________________________________________   FINAL CLINICAL IMPRESSION(S) / ED DIAGNOSES  Final diagnoses:  Contusion of back, unspecified laterality, initial encounter      NEW MEDICATIONS STARTED DURING THIS VISIT:  New Prescriptions   OXYCODONE-ACETAMINOPHEN (PERCOCET) 5-325 MG TABLET    Take 1 tablet by mouth every 6 (six) hours as needed for up to 5 days for severe pain.     Note:  This document was prepared using Dragon voice recognition software and may include unintentional dictation errors.    Arta Silence, MD 06/18/20 [phone]

## 2020-06-18 NOTE — ED Triage Notes (Signed)
Pt fell two feet off porch from back injuring back, head and neck. Pt with rigid c collar in place, unsure of loc.

## 2020-06-18 NOTE — ED Notes (Signed)
Pt with CT. 

## 2020-06-18 NOTE — Discharge Instructions (Signed)
Your CT scan shows a possible fracture in the T2 vertebrae in your back, however this may also be old and not related to your fall today.  You should follow-up with the spine specialist within the next 1 to 2 weeks.  Referral has been provided.  You may take the Percocet as needed for pain although you should move to over-the-counter medicines for pain as soon as you are able to.  Return to the ER for new, worsening, or persistent severe back pain, weakness or numbness in the arms or legs, or any other new or worsening symptoms that concern you.

## 2020-07-14 ENCOUNTER — Other Ambulatory Visit: Payer: Self-pay | Admitting: Family Medicine

## 2020-07-20 ENCOUNTER — Telehealth (INDEPENDENT_AMBULATORY_CARE_PROVIDER_SITE_OTHER): Payer: Medicare HMO | Admitting: Internal Medicine

## 2020-07-20 ENCOUNTER — Encounter: Payer: Self-pay | Admitting: Internal Medicine

## 2020-07-20 VITALS — Ht 73.0 in | Wt 206.0 lb

## 2020-07-20 DIAGNOSIS — L309 Dermatitis, unspecified: Secondary | ICD-10-CM

## 2020-07-20 DIAGNOSIS — L249 Irritant contact dermatitis, unspecified cause: Secondary | ICD-10-CM

## 2020-07-20 MED ORDER — TRIAMCINOLONE ACETONIDE 0.5 % EX OINT: 1.0000 "application " | TOPICAL_OINTMENT | Freq: Two times a day (BID) | CUTANEOUS | 0 refills | Status: DC

## 2020-07-20 NOTE — Progress Notes (Signed)
Virtual Visit via Video Note  I connected with Maria Hamilton  on 07/20/20 at 11:42 AM EDT by a video enabled telemedicine application and verified that I am speaking with the correct person using two identifiers.  Location patient: home Location provider:work or home office Persons participating in the virtual visit: patient, provider  I discussed the limitations of evaluation and management by telemedicine and the availability of in person appointments. The patient expressed understanding and agreed to proceed.   HPI: Inner right arm with red bumpy itchy rash and left neck similar rash tried hc cream and antibiotic cream otc rash been present x 1-2 weeks using gain detergent, dove body wash this hs not changed for >1 year and johnson and johnson lotion   ROS: See pertinent positives and negatives per HPI.  Past Medical History:  Diagnosis Date  . Acute anxiety 08/03/2015   Overview:  Last Assessment & Plan:  Klonopin most helpful  Reports past taking it 1 tab am 1/2 tab midday and 1 tab pm  30 day supply faxed to pharmacy  . Acute bronchitis 12/31/2016  . Acute pancreatitis 10/14/2016  . Altered mental status 10/12/2016  . Anxiety   . Bronchitis   . Chronic hip pain   . Chronic pain syndrome   . Depression   . Edema   . Fatigue   . Fibromyalgia   . GERD (gastroesophageal reflux disease)   . Headache   . Hypertension   . Insomnia   . Low back pain   . Right upper quadrant abdominal pain   . Vitamin D deficiency     Past Surgical History:  Procedure Laterality Date  . ABDOMINAL HYSTERECTOMY    . BACK SURGERY    . BREAST BIOPSY Left 90s   benign  . BREAST SURGERY    . CHOLECYSTECTOMY N/A 10/16/2016   Procedure: LAPAROSCOPIC CHOLECYSTECTOMY WITH INTRAOPERATIVE CHOLANGIOGRAM;  Surgeon: Dia Crawford III, MD;  Location: [facility] ORS;  Service: General;  Laterality: N/A;  . cyst removal from wrist    . ECTOPIC PREGNANCY SURGERY    . HIP SURGERY     x4  . OOPHORECTOMY       Family History  Problem Relation Age of Onset  . Heart disease Mother   . Cancer Father   . Alcohol abuse Father     SOCIAL HX:  Lives with husband   Current Outpatient Medications:  .  amLODipine (NORVASC) 10 MG tablet, TAKE 1 TABLET EVERY DAY, Disp: 90 tablet, Rfl: 1 .  calcium carbonate (OSCAL) 1500 (600 Ca) MG TABS tablet, Take 600 mg of elemental calcium by mouth daily with breakfast., Disp: , Rfl:  .  carvedilol (COREG) 12.5 MG tablet, Take 1 tablet (12.5 mg total) by mouth 2 (two) times daily with a meal., Disp: 180 tablet, Rfl: 1 .  cholecalciferol (VITAMIN D) 1000 units tablet, Take 1,000 Units by mouth daily., Disp: , Rfl:  .  escitalopram (LEXAPRO) 10 MG tablet, TAKE 1 TABLET EVERY DAY, Disp: 90 tablet, Rfl: 1 .  gabapentin (NEURONTIN) 600 MG tablet, TAKE 1 TABLET AT BEDTIME, Disp: 90 tablet, Rfl: 1 .  Magnesium 250 MG TABS, Take by mouth at bedtime., Disp: , Rfl:  .  potassium chloride SA (KLOR-CON) 20 MEQ tablet, TAKE 1 TABLET EVERY DAY, Disp: 90 tablet, Rfl: 1 .  rosuvastatin (CRESTOR) 20 MG tablet, Take 1 tablet (20 mg total) by mouth daily., Disp: 90 tablet, Rfl: 3 .  spironolactone (ALDACTONE) 25 MG tablet, Take 0.5 tablets (  12.5 mg total) by mouth daily. In am, Disp: 90 tablet, Rfl: 0 .  tiZANidine (ZANAFLEX) 2 MG tablet, TAKE 1 TABLET(2 MG) BY MOUTH AT BEDTIME, Disp: 30 tablet, Rfl: 1 .  triamcinolone ointment (KENALOG) 0.5 %, Apply 1 application topically 2 (two) times daily. Prn Left neck and right inner arm x1-2 weeks prn, Disp: 60 g, Rfl: 0  EXAM:  VITALS per patient if applicable:  GENERAL: alert, oriented, appears well and in no acute distress  HEENT: atraumatic, conjunttiva clear, no obvious abnormalities on inspection of external nose and ears  NECK: normal movements of the head and neck  LUNGS: on inspection no signs of respiratory distress, breathing rate appears normal, no obvious gross SOB, gasping or wheezing  CV: no obvious cyanosis  MS:  moves all visible extremities without noticeable abnormality  PSYCH/NEURO: pleasant and cooperative, no obvious depression or anxiety, speech and thought processing grossly intact  Skin right inner forearm rash and left neck likely contact derm/eczema  ASSESSMENT AND PLAN:  Discussed the following assessment and plan:  Irritant contact dermatitis, unspecified trigger - Plan: triamcinolone ointment (KENALOG) 0.5 %  Eczema, unspecified type - Plan: triamcinolone ointment (KENALOG) 0.5 % rtc if not better   -we discussed possible serious and likely etiologies, options for evaluation and workup, limitations of telemedicine visit vs in person visit, treatment, treatment risks and precautions. Pt prefers to treat via telemedicine empirically rather then risking or undertaking an in person visit at this moment. Patient agrees to seek prompt in person care if worsening, new symptoms arise, or if is not improving with treatment.   I discussed the assessment and treatment plan with the patient. The patient was provided an opportunity to ask questions and all were answered. The patient agreed with the plan and demonstrated an understanding of the instructions.   The patient was advised to call back or seek an in-person evaluation if the symptoms worsen or if the condition fails to improve as anticipated.  Time spent 20 min Delorise Jackson, MD

## 2020-07-20 NOTE — Progress Notes (Signed)
Patient presenting with a rash from working in her yard. In the crease of the right arm and left side neck.   States it itches and states she thinks it spreads when she scratches it.

## 2020-07-31 NOTE — Progress Notes (Signed)
This patient's chart is under "My Open Charts". These are cancelled appointments that keep popping into my "In Basket" as a deficiency. See what you can do to remove them.   Thank you. 

## 2020-08-17 ENCOUNTER — Other Ambulatory Visit: Payer: Self-pay | Admitting: Family Medicine

## 2020-08-17 DIAGNOSIS — E7800 Pure hypercholesterolemia, unspecified: Secondary | ICD-10-CM

## 2020-08-26 ENCOUNTER — Other Ambulatory Visit: Payer: Self-pay | Admitting: Family Medicine

## 2020-08-28 ENCOUNTER — Other Ambulatory Visit: Payer: Self-pay | Admitting: Family Medicine

## 2020-09-13 ENCOUNTER — Other Ambulatory Visit: Payer: Self-pay | Admitting: Family Medicine

## 2020-09-15 ENCOUNTER — Other Ambulatory Visit: Payer: Self-pay

## 2020-09-15 DIAGNOSIS — R600 Localized edema: Secondary | ICD-10-CM

## 2020-09-15 DIAGNOSIS — I10 Essential (primary) hypertension: Secondary | ICD-10-CM

## 2020-09-15 MED ORDER — SPIRONOLACTONE 25 MG PO TABS: 12.5000 mg | ORAL_TABLET | Freq: Every day | ORAL | 0 refills | Status: DC

## 2020-10-20 ENCOUNTER — Telehealth: Payer: Self-pay | Admitting: Family Medicine

## 2020-10-20 NOTE — Telephone Encounter (Signed)
Pt needs a refill on tiZANidine (ZANAFLEX) 2 MG tablet she increased herself to 2- 2mg  tablets at bedtime because 1-2mg  tablet wasn't working

## 2020-10-21 MED ORDER — TIZANIDINE HCL 2 MG PO TABS: 4.0000 mg | ORAL_TABLET | Freq: Every evening | ORAL | 1 refills | Status: DC | PRN

## 2020-10-21 NOTE — Telephone Encounter (Signed)
I called and spoke with the patient and Informed her that the medication zanaflex was sent to the pharmacy for her and in the future I informed her not to increase the medication on her own because it could be dangerous and she understood.  Patient is scheduled for  a follow up with the provider.  Maria Hamilton,cma

## 2020-10-21 NOTE — Addendum Note (Signed)
Addended by: Caryl Bis Leontae Bostock G on: 10/21/2020 12:36 PM   Modules accepted: Orders

## 2020-10-21 NOTE — Telephone Encounter (Signed)
I will refill, though in the future she should not increase medication on her own and she should contact us to discuss first. She is also due for follow-up. Please get her scheduled.

## 2020-10-29 ENCOUNTER — Other Ambulatory Visit: Payer: Self-pay | Admitting: Family Medicine

## 2020-10-31 ENCOUNTER — Encounter: Payer: Self-pay | Admitting: Family Medicine

## 2020-10-31 ENCOUNTER — Other Ambulatory Visit: Payer: Self-pay

## 2020-10-31 ENCOUNTER — Telehealth (INDEPENDENT_AMBULATORY_CARE_PROVIDER_SITE_OTHER): Payer: Medicare HMO | Admitting: Family Medicine

## 2020-10-31 DIAGNOSIS — J069 Acute upper respiratory infection, unspecified: Secondary | ICD-10-CM

## 2020-10-31 DIAGNOSIS — I10 Essential (primary) hypertension: Secondary | ICD-10-CM

## 2020-10-31 DIAGNOSIS — G629 Polyneuropathy, unspecified: Secondary | ICD-10-CM | POA: Diagnosis not present

## 2020-10-31 MED ORDER — GABAPENTIN 300 MG PO CAPS: ORAL_CAPSULE | ORAL | 1 refills | Status: DC

## 2020-10-31 NOTE — Assessment & Plan Note (Signed)
Suspect viral upper respiratory infection.  Discussed the potential for COVID-19 as a cause.  I advised that she get tested for COVID-19 though she declined.  Discussed strict quarantine precautions for 10 days and having to have her symptoms to be improving prior to coming off of quarantine.  Discussed monitoring and if she develops any worsening symptoms she should let us know.  Advised to seek medical attention for shortness of breath or hemoptysis.

## 2020-10-31 NOTE — Progress Notes (Signed)
Virtual Visit via telephone Note  This visit type was conducted due to national recommendations for restrictions regarding the COVID-19 pandemic (e.g. social distancing).  This format is felt to be most appropriate for this patient at this time.  All issues noted in this document were discussed and addressed.  No physical exam was performed (except for noted visual exam findings with Video Visits).   I connected with Maria Hamilton today at  1:15 PM EST by telephone and verified that I am speaking with the correct person using two identifiers. Location patient: home Location provider: work  Persons participating in the virtual visit: patient, provider  I discussed the limitations, risks, security and privacy concerns of performing an evaluation and management service by telephone and the availability of in person appointments. I also discussed with the patient that there may be a patient responsible charge related to this service. The patient expressed understanding and agreed to proceed.  Interactive audio and video telecommunications were attempted between this provider and patient, however failed, due to patient having technical difficulties OR patient did not have access to video capability.  We continued and completed visit with audio only.   Reason for visit: f/u  HPI: Cough: This has been going on for 2 days.  She is coughing up some white mucus at times.  No congestion, fever, shortness of breath, taste or smell disturbances.  She has not been vaccinated against COVID-19.  She notes no COVID-19 exposures.  She has not taken any medicines for this.  Hypertension: Averaging 120/70.  Taking amlodipine, spironolactone, and carvedilol.  No chest pain, shortness of breath, or edema.  Neuropathy: Patient notes continued neuropathy issues with numbness and a cold sensation in her legs.  She is on gabapentin 600 mg at night and notes it was beneficial previously though has been less  beneficial recently.  She has the symptoms throughout the day.  She notes no drowsiness with the gabapentin.  ROS: See pertinent positives and negatives per HPI.  Past Medical History:  Diagnosis Date  . Acute anxiety 08/03/2015   Overview:  Last Assessment & Plan:  Klonopin most helpful  Reports past taking it 1 tab am 1/2 tab midday and 1 tab pm  30 day supply faxed to pharmacy  . Acute bronchitis 12/31/2016  . Acute pancreatitis 10/14/2016  . Altered mental status 10/12/2016  . Anxiety   . Bronchitis   . Chronic hip pain   . Chronic pain syndrome   . Depression   . Edema   . Fatigue   . Fibromyalgia   . GERD (gastroesophageal reflux disease)   . Headache   . Hypertension   . Insomnia   . Low back pain   . Right upper quadrant abdominal pain   . Vitamin D deficiency     Past Surgical History:  Procedure Laterality Date  . ABDOMINAL HYSTERECTOMY    . BACK SURGERY    . BREAST BIOPSY Left 90s   benign  . BREAST SURGERY    . CHOLECYSTECTOMY N/A 10/16/2016   Procedure: LAPAROSCOPIC CHOLECYSTECTOMY WITH INTRAOPERATIVE CHOLANGIOGRAM;  Surgeon: Dia Crawford III, MD;  Location: [facility] ORS;  Service: General;  Laterality: N/A;  . cyst removal from wrist    . ECTOPIC PREGNANCY SURGERY    . HIP SURGERY     x4  . OOPHORECTOMY      Family History  Problem Relation Age of Onset  . Heart disease Mother   . Cancer Father   . Alcohol  abuse Father     SOCIAL HX: Non-smoker   Current Outpatient Medications:  .  amLODipine (NORVASC) 10 MG tablet, TAKE 1 TABLET EVERY DAY, Disp: 90 tablet, Rfl: 1 .  calcium carbonate (OSCAL) 1500 (600 Ca) MG TABS tablet, Take 600 mg of elemental calcium by mouth daily with breakfast., Disp: , Rfl:  .  carvedilol (COREG) 12.5 MG tablet, Take 1 tablet (12.5 mg total) by mouth 2 (two) times daily with a meal., Disp: 180 tablet, Rfl: 1 .  cholecalciferol (VITAMIN D) 1000 units tablet, Take 1,000 Units by mouth daily., Disp: , Rfl:  .  escitalopram  (LEXAPRO) 10 MG tablet, TAKE 1 TABLET EVERY DAY, Disp: 90 tablet, Rfl: 1 .  Magnesium 250 MG TABS, Take by mouth at bedtime., Disp: , Rfl:  .  potassium chloride SA (KLOR-CON) 20 MEQ tablet, TAKE 1 TABLET EVERY DAY, Disp: 90 tablet, Rfl: 1 .  rosuvastatin (CRESTOR) 20 MG tablet, TAKE 1 TABLET EVERY DAY, Disp: 90 tablet, Rfl: 3 .  spironolactone (ALDACTONE) 25 MG tablet, Take 0.5 tablets (12.5 mg total) by mouth daily. In am, Disp: 90 tablet, Rfl: 0 .  tiZANidine (ZANAFLEX) 2 MG tablet, Take 2 tablets (4 mg total) by mouth at bedtime as needed for muscle spasms., Disp: 30 tablet, Rfl: 1 .  triamcinolone ointment (KENALOG) 0.5 %, Apply 1 application topically 2 (two) times daily. Prn Left neck and right inner arm x1-2 weeks prn, Disp: 60 g, Rfl: 0 .  gabapentin (NEURONTIN) 300 MG capsule, Take 1 capsule (300 mg total) by mouth in the morning AND 2 capsules (600 mg total) at bedtime., Disp: 270 capsule, Rfl: 1  EXAM: This was a telephone visit and thus no physical exam was completed.  ASSESSMENT AND PLAN:  Discussed the following assessment and plan:  Problem List Items Addressed This Visit    Hypertension    Well-controlled.  Given current respiratory illness we will plan for labs at her next visit.  She will continue amlodipine 10 mg once daily, carvedilol 12.5 mg twice daily, and spironolactone 12.5 mg daily.      Polyneuropathy    Gabapentin has not been as beneficial recently.  We will add a 300 mg dose in the morning.  She will take half a tablet of her current gabapentin in the morning and a whole tablet of her current gabapentin in the evening.  I will also send in a new prescription for capsules for future use.      Relevant Medications   gabapentin (NEURONTIN) 300 MG capsule   Viral URI with cough    Suspect viral upper respiratory infection.  Discussed the potential for COVID-19 as a cause.  I advised that she get tested for COVID-19 though she declined.  Discussed strict  quarantine precautions for 10 days and having to have her symptoms to be improving prior to coming off of quarantine.  Discussed monitoring and if she develops any worsening symptoms she should let us know.  Advised to seek medical attention for shortness of breath or hemoptysis.          I discussed the assessment and treatment plan with the patient. The patient was provided an opportunity to ask questions and all were answered. The patient agreed with the plan and demonstrated an understanding of the instructions.   The patient was advised to call back or seek an in-person evaluation if the symptoms worsen or if the condition fails to improve as anticipated.  I provided 11 minutes of  non-face-to-face time during this encounter.   [name], MD

## 2020-10-31 NOTE — Assessment & Plan Note (Signed)
Gabapentin has not been as beneficial recently.  We will add a 300 mg dose in the morning.  She will take half a tablet of her current gabapentin in the morning and a whole tablet of her current gabapentin in the evening.  I will also send in a new prescription for capsules for future use.

## 2020-10-31 NOTE — Assessment & Plan Note (Signed)
Well-controlled.  Given current respiratory illness we will plan for labs at her next visit.  She will continue amlodipine 10 mg once daily, carvedilol 12.5 mg twice daily, and spironolactone 12.5 mg daily.

## 2020-12-07 ENCOUNTER — Telehealth: Payer: Self-pay | Admitting: Family Medicine

## 2020-12-07 NOTE — Telephone Encounter (Signed)
Spoke with patient and Appointment scheduled.  Maria Hamilton,cma

## 2020-12-07 NOTE — Telephone Encounter (Signed)
She would need to complete another visit for this as it has been a month since I saw her previously and we need to complete a visit to help determine an appropriate treatment regimen and work up. Thanks.

## 2020-12-07 NOTE — Telephone Encounter (Signed)
Pt has had an ongoing cough for the last month She has tried everything and nothing is helping wanted to know if something could be called in

## 2020-12-12 ENCOUNTER — Inpatient Hospital Stay
Admission: EM | Admit: 2020-12-12 | Discharge: 2020-12-16 | DRG: 177 | Disposition: A | Discharge status: Home Health | Payer: Medicare HMO | Attending: Internal Medicine | Admitting: Internal Medicine

## 2020-12-12 ENCOUNTER — Encounter: Payer: Self-pay | Admitting: Emergency Medicine

## 2020-12-12 ENCOUNTER — Other Ambulatory Visit: Payer: Self-pay

## 2020-12-12 ENCOUNTER — Emergency Department: Payer: Medicare HMO

## 2020-12-12 DIAGNOSIS — M545 Low back pain, unspecified: Secondary | ICD-10-CM | POA: Diagnosis present

## 2020-12-12 DIAGNOSIS — Z8616 Personal history of COVID-19: Secondary | ICD-10-CM | POA: Diagnosis present

## 2020-12-12 DIAGNOSIS — Z90710 Acquired absence of both cervix and uterus: Secondary | ICD-10-CM

## 2020-12-12 DIAGNOSIS — M797 Fibromyalgia: Secondary | ICD-10-CM | POA: Diagnosis present

## 2020-12-12 DIAGNOSIS — J1282 Pneumonia due to coronavirus disease 2019: Secondary | ICD-10-CM

## 2020-12-12 DIAGNOSIS — R531 Weakness: Secondary | ICD-10-CM | POA: Diagnosis not present

## 2020-12-12 DIAGNOSIS — D72818 Other decreased white blood cell count: Secondary | ICD-10-CM | POA: Diagnosis present

## 2020-12-12 DIAGNOSIS — G894 Chronic pain syndrome: Secondary | ICD-10-CM | POA: Diagnosis present

## 2020-12-12 DIAGNOSIS — Z23 Encounter for immunization: Secondary | ICD-10-CM | POA: Diagnosis not present

## 2020-12-12 DIAGNOSIS — K219 Gastro-esophageal reflux disease without esophagitis: Secondary | ICD-10-CM | POA: Diagnosis present

## 2020-12-12 DIAGNOSIS — R5381 Other malaise: Secondary | ICD-10-CM | POA: Diagnosis present

## 2020-12-12 DIAGNOSIS — R059 Cough, unspecified: Secondary | ICD-10-CM | POA: Diagnosis not present

## 2020-12-12 DIAGNOSIS — I10 Essential (primary) hypertension: Secondary | ICD-10-CM | POA: Diagnosis present

## 2020-12-12 DIAGNOSIS — U071 COVID-19: Principal | ICD-10-CM | POA: Diagnosis present

## 2020-12-12 DIAGNOSIS — R2681 Unsteadiness on feet: Secondary | ICD-10-CM | POA: Diagnosis present

## 2020-12-12 DIAGNOSIS — F419 Anxiety disorder, unspecified: Secondary | ICD-10-CM | POA: Diagnosis present

## 2020-12-12 DIAGNOSIS — F32A Depression, unspecified: Secondary | ICD-10-CM | POA: Diagnosis present

## 2020-12-12 DIAGNOSIS — E860 Dehydration: Secondary | ICD-10-CM | POA: Diagnosis present

## 2020-12-12 DIAGNOSIS — E785 Hyperlipidemia, unspecified: Secondary | ICD-10-CM | POA: Diagnosis present

## 2020-12-12 DIAGNOSIS — Z79899 Other long term (current) drug therapy: Secondary | ICD-10-CM

## 2020-12-12 DIAGNOSIS — E559 Vitamin D deficiency, unspecified: Secondary | ICD-10-CM | POA: Diagnosis present

## 2020-12-12 DIAGNOSIS — R0602 Shortness of breath: Secondary | ICD-10-CM | POA: Diagnosis not present

## 2020-12-12 LAB — COMPREHENSIVE METABOLIC PANEL
ALT: 16 U/L (ref 0–44)
AST: 34 U/L (ref 15–41)
Albumin: 3.8 g/dL (ref 3.5–5.0)
Alkaline Phosphatase: 70 U/L (ref 38–126)
Anion gap: 10 (ref 5–15)
BUN: 12 mg/dL (ref 8–23)
CO2: 25 mmol/L (ref 22–32)
Calcium: 8.7 mg/dL — ABNORMAL LOW (ref 8.9–10.3)
Chloride: 99 mmol/L (ref 98–111)
Creatinine, Ser: 0.87 mg/dL (ref 0.44–1.00)
GFR, Estimated: >60 mL/min (ref >60)
Glucose, Bld: 94 mg/dL (ref 70–99)
Potassium: 3.9 mmol/L (ref 3.5–5.1)
Sodium: 134 mmol/L — ABNORMAL LOW (ref 135–145)
Total Bilirubin: 0.4 mg/dL (ref 0.3–1.2)
Total Protein: 8.1 g/dL (ref 6.5–8.1)

## 2020-12-12 LAB — C-REACTIVE PROTEIN: CRP: 1.2 mg/dL — ABNORMAL HIGH (ref <1.0)

## 2020-12-12 LAB — CBC
HCT: 40.1 % (ref 36.0–46.0)
Hemoglobin: 13 g/dL (ref 12.0–15.0)
MCH: 28.4 pg (ref 26.0–34.0)
MCHC: 32.4 g/dL (ref 30.0–36.0)
MCV: 87.6 fL (ref 80.0–100.0)
Platelets: 184 10*3/uL (ref 150–400)
RBC: 4.58 MIL/uL (ref 3.87–5.11)
RDW: 12.9 % (ref 11.5–15.5)
WBC: 4.3 10*3/uL (ref 4.0–10.5)
nRBC: 0 % (ref 0.0–0.2)

## 2020-12-12 LAB — FIBRIN DERIVATIVES D-DIMER (ARMC ONLY): Fibrin derivatives D-dimer (ARMC): 1667.7 ng/mL (FEU) — ABNORMAL HIGH (ref 0.00–499.00)

## 2020-12-12 LAB — TROPONIN I (HIGH SENSITIVITY): Troponin I (High Sensitivity): 9 ng/L (ref <18)

## 2020-12-12 LAB — LACTATE DEHYDROGENASE: LDH: 176 U/L (ref 98–192)

## 2020-12-12 LAB — RESP PANEL BY RT-PCR (FLU A&B, COVID) ARPGX2
Influenza A by PCR: NEGATIVE
Influenza B by PCR: NEGATIVE
SARS Coronavirus 2 by RT PCR: POSITIVE — AB

## 2020-12-12 LAB — FERRITIN: Ferritin: 162 ng/mL (ref 11–307)

## 2020-12-12 MED ORDER — DEXTROSE IN LACTATED RINGERS 5 % IV SOLN: INTRAVENOUS | Status: DC

## 2020-12-12 MED ORDER — ENOXAPARIN SODIUM 40 MG/0.4ML ~~LOC~~ SOLN
40.0000 mg | SUBCUTANEOUS | Status: DC
  Administered 2020-12-12 – 2020-12-14 (×3): 40 mg via SUBCUTANEOUS
  Filled 2020-12-12 (×4): qty 0.4

## 2020-12-12 MED ORDER — BENZONATATE 100 MG PO CAPS
100.0000 mg | ORAL_CAPSULE | Freq: Once | ORAL | Status: AC
  Administered 2020-12-12: 15:00:00 100 mg via ORAL
  Filled 2020-12-12: qty 1

## 2020-12-12 MED ORDER — SODIUM CHLORIDE 0.9 % IV SOLN
100.0000 mg | Freq: Every day | INTRAVENOUS | Status: AC
  Administered 2020-12-13 – 2020-12-16 (×4): 100 mg via INTRAVENOUS
  Filled 2020-12-12 (×5): qty 20

## 2020-12-12 MED ORDER — ONDANSETRON HCL 4 MG PO TABS
4.0000 mg | ORAL_TABLET | Freq: Four times a day (QID) | ORAL | Status: DC | PRN
  Administered 2020-12-13 – 2020-12-15 (×3): 4 mg via ORAL
  Filled 2020-12-12 (×2): qty 1

## 2020-12-12 MED ORDER — ACETAMINOPHEN 325 MG PO TABS
650.0000 mg | ORAL_TABLET | Freq: Four times a day (QID) | ORAL | Status: DC | PRN
  Administered 2020-12-12 – 2020-12-15 (×7): 650 mg via ORAL
  Filled 2020-12-12 (×7): qty 2

## 2020-12-12 MED ORDER — ONDANSETRON HCL 4 MG/2ML IJ SOLN
4.0000 mg | Freq: Four times a day (QID) | INTRAMUSCULAR | Status: DC | PRN
  Administered 2020-12-12 – 2020-12-13 (×2): 4 mg via INTRAVENOUS
  Filled 2020-12-12 (×3): qty 2

## 2020-12-12 MED ORDER — POLYETHYLENE GLYCOL 3350 17 G PO PACK: 17.0000 g | PACK | Freq: Every day | ORAL | Status: DC | PRN

## 2020-12-12 MED ORDER — SODIUM CHLORIDE 0.9 % IV SOLN
200.0000 mg | Freq: Once | INTRAVENOUS | Status: AC
  Administered 2020-12-12: 16:00:00 200 mg via INTRAVENOUS
  Filled 2020-12-12: qty 200

## 2020-12-12 MED ORDER — GUAIFENESIN-DM 100-10 MG/5ML PO SYRP
10.0000 mL | ORAL_SOLUTION | ORAL | Status: DC | PRN
  Administered 2020-12-12 – 2020-12-13 (×5): 10 mL via ORAL
  Filled 2020-12-12 (×5): qty 10

## 2020-12-12 MED ORDER — INFLUENZA VAC A&B SA ADJ QUAD 0.5 ML IM PRSY
0.5000 mL | PREFILLED_SYRINGE | INTRAMUSCULAR | Status: DC
  Filled 2020-12-12: qty 0.5

## 2020-12-12 MED ORDER — SODIUM CHLORIDE 0.9 % IV BOLUS
1000.0000 mL | Freq: Once | INTRAVENOUS | Status: AC
  Administered 2020-12-12: 15:00:00 1000 mL via INTRAVENOUS

## 2020-12-12 NOTE — Plan of Care (Signed)

## 2020-12-12 NOTE — ED Notes (Signed)
When attempting to stand pt to walk pt unable to maintain balance and leaning heavy to right side. Pt lost balance multiple times almost falling. Pt placed back in bed. When standing pt did not desat, she remained 97-100% throughout.

## 2020-12-12 NOTE — ED Notes (Signed)
Visitor remains at bedside. Pt updated as requested.

## 2020-12-12 NOTE — ED Notes (Signed)
Pt given remote to tv as requested.

## 2020-12-12 NOTE — ED Triage Notes (Signed)
C/O cough and congestion x 1 week.  Per EMS patient has history of fibromyalgia and has not been taking medications.  Also c/o cramps to legs.  AAOx3.  Skin warm and dry. NAD

## 2020-12-12 NOTE — H&P (Signed)
History and Physical:    Maria Hamilton   WGN:[id number] DOB: Sep 20, 1951 DOA: 12/12/2020  Referring MD/provider: Enid Derry, MD PCP: Glori Luis, MD   Patient coming from: Home  Chief Complaint: Cough  History of Present Illness:   Maria Hamilton is a 69 y.o. female with multiple medical problems including but not limited to depression, chronic back pain status post multiple back surgeries, fibromyalgia, chronic pain syndrome, GERD, headache, hypertension, insomnia, vitamin D deficiency, history of acute bronchitis, history of acute pancreatitis.  She presented to the hospital because of cough.  She said she been coughing for over a week.  Cough has progressively worsened.  Cough is sometimes productive of clear sputum.  She has soreness in the chest from coughing.  She has not been able to eat or drink well for the past week or so.  She feels weak and tired.  Her gait has been unsteady as a result of generalized weakness.  She said she has fallen a few times in the past and she thinks her last fall was sometime this past summer when she fell in the kitchen.  She said prior to feeling ill over a week ago, her gait was fine.    ED Course: She tested positive for COVID-19 infection in the ED.  She had a low-grade fever with temperature 100.2 but other vital signs were normal.  She was not hypoxic.  Chest x-ray showed findings suggestive of bilateral pneumonia.  Ambulation was attempted with the nurse in the emergency room but she lost her balance multiple times and almost fell.  ROS:   ROS all other systems reviewed were negative  Past Medical History:   Past Medical History:  Diagnosis Date  . Acute anxiety 08/03/2015   Overview:  Last Assessment & Plan:  Klonopin most helpful  Reports past taking it 1 tab am 1/2 tab midday and 1 tab pm  30 day supply faxed to pharmacy  . Acute bronchitis 12/31/2016  . Acute pancreatitis 10/14/2016  . Altered mental status 10/12/2016   . Anxiety   . Bronchitis   . Chronic hip pain   . Chronic pain syndrome   . Depression   . Edema   . Fatigue   . Fibromyalgia   . GERD (gastroesophageal reflux disease)   . Headache   . Hypertension   . Insomnia   . Low back pain   . Right upper quadrant abdominal pain   . Vitamin D deficiency     Past Surgical History:   Past Surgical History:  Procedure Laterality Date  . ABDOMINAL HYSTERECTOMY    . BACK SURGERY    . BREAST BIOPSY Left 90s   benign  . BREAST SURGERY    . CHOLECYSTECTOMY N/A 10/16/2016   Procedure: LAPAROSCOPIC CHOLECYSTECTOMY WITH INTRAOPERATIVE CHOLANGIOGRAM;  Surgeon: Tiney Rouge III, MD;  Location: [facility] ORS;  Service: General;  Laterality: N/A;  . cyst removal from wrist    . ECTOPIC PREGNANCY SURGERY    . HIP SURGERY     x4  . OOPHORECTOMY      Social History:   Social History   Socioeconomic History  . Marital status: Married    Spouse name: Not on file  . Number of children: Not on file  . Years of education: Not on file  . Highest education level: Not on file  Occupational History  . Not on file  Tobacco Use  . Smoking status: Never Smoker  . Smokeless tobacco: Never Used  Substance and Sexual Activity  . Alcohol use: No    Alcohol/week: 0.0 standard drinks  . Drug use: No  . Sexual activity: Yes  Other Topics Concern  . Not on file  Social History Narrative  . Not on file   Social Determinants of Health   Financial Resource Strain: Not on file  Food Insecurity: Not on file  Transportation Needs: Not on file  Physical Activity: Not on file  Stress: Not on file  Social Connections: Not on file  Intimate Partner Violence: Not on file    Allergies   Ace inhibitors, Ciprofloxacin, Fentanyl, Hctz [hydrochlorothiazide], Latex, Morphine and related, Penicillin g benzathine, Isovue m [iopamidol], and Tape  Family history:   Family History  Problem Relation Age of Onset  . Heart disease Mother   . Cancer Father   .  Alcohol abuse Father     Current Medications:   Prior to Admission medications   Medication Sig Start Date End Date Taking? Authorizing Provider  amLODipine (NORVASC) 10 MG tablet TAKE 1 TABLET EVERY DAY 10/31/20   Glori Luis, MD  calcium carbonate (OSCAL) 1500 (600 Ca) MG TABS tablet Take 600 mg of elemental calcium by mouth daily with breakfast.    [provider]  carvedilol (COREG) 12.5 MG tablet Take 1 tablet (12.5 mg total) by mouth 2 (two) times daily with a meal. 05/02/20   Glori Luis, MD  cholecalciferol (VITAMIN D) 1000 units tablet Take 1,000 Units by mouth daily.    [provider]  escitalopram (LEXAPRO) 10 MG tablet TAKE 1 TABLET EVERY DAY 08/26/20   Glori Luis, MD  gabapentin (NEURONTIN) 300 MG capsule Take 1 capsule (300 mg total) by mouth in the morning AND 2 capsules (600 mg total) at bedtime. 10/31/20   Glori Luis, MD  Magnesium 250 MG TABS Take by mouth at bedtime.    [provider]  potassium chloride SA (KLOR-CON) 20 MEQ tablet TAKE 1 TABLET EVERY DAY 10/31/20   Glori Luis, MD  rosuvastatin (CRESTOR) 20 MG tablet TAKE 1 TABLET EVERY DAY 08/17/20   Glori Luis, MD  spironolactone (ALDACTONE) 25 MG tablet Take 0.5 tablets (12.5 mg total) by mouth daily. In am 09/15/20   Glori Luis, MD  tiZANidine (ZANAFLEX) 2 MG tablet Take 2 tablets (4 mg total) by mouth at bedtime as needed for muscle spasms. 10/21/20   Glori Luis, MD  triamcinolone ointment (KENALOG) 0.5 % Apply 1 application topically 2 (two) times daily. Prn Left neck and right inner arm x1-2 weeks prn 07/20/20   McLean-Scocuzza, Pasty Spillers, MD    Physical Exam:   Vitals:   12/12/20 1330 12/12/20 1345 12/12/20 1400 12/12/20 1430  BP: (!) 142/88  116/80 132/80  Pulse: 71  78 74  Resp:  17  17  Temp:      TempSrc:      SpO2: 98%  98% 98%  Weight:      Height:         Physical Exam: Blood pressure 132/80, pulse 74, temperature  100.2 F (37.9 C), temperature source Oral, resp. rate 17, height 6' (1.829 m), weight 91.2 kg, SpO2 98 %. Gen: No acute distress. Head: Normocephalic, atraumatic. Eyes: Pupils equal, round and reactive to light. Extraocular movements intact.  Sclerae nonicteric.  Mouth: Dry mucous membranes Neck: Supple, no thyromegaly, no lymphadenopathy, no jugular venous distention. Chest: Lungs are clear to auscultation with good air movement. No rales, rhonchi or  wheezes.  CV: Heart sounds are regular with an S1, S2. No murmurs, rubs or gallops.  Abdomen: Soft, nontender, nondistended with normal active bowel sounds. No palpable masses. Extremities: Extremities are without clubbing, or cyanosis. No edema. Pedal pulses 2+.  Skin: Warm and dry. No rashes, lesions or wounds Neuro: Alert and oriented times 3; grossly nonfocal.  Psych: Insight is good and judgment is appropriate. Mood and affect normal.   Data Review:    Labs: Basic Metabolic Panel: Recent Labs  Lab 12/12/20 1055  NA 134*  K 3.9  CL 99  CO2 25  GLUCOSE 94  BUN 12  CREATININE 0.87  CALCIUM 8.7*   Liver Function Tests: Recent Labs  Lab 12/12/20 1055  AST 34  ALT 16  ALKPHOS 70  BILITOT 0.4  PROT 8.1  ALBUMIN 3.8   No results for input(s): LIPASE, AMYLASE in the last 168 hours. No results for input(s): AMMONIA in the last 168 hours. CBC: Recent Labs  Lab 12/12/20 1055  WBC 4.3  HGB 13.0  HCT 40.1  MCV 87.6  PLT 184   Cardiac Enzymes: No results for input(s): CKTOTAL, CKMB, CKMBINDEX, TROPONINI in the last 168 hours.  BNP (last 3 results) No results for input(s): PROBNP in the last 8760 hours. CBG: No results for input(s): GLUCAP in the last 168 hours.  Urinalysis    Component Value Date/Time   COLORURINE YELLOW (A) 07/10/2018 1707   APPEARANCEUR CLEAR (A) 07/10/2018 1707   LABSPEC 1.005 07/10/2018 1707   PHURINE 6.0 07/10/2018 1707   GLUCOSEU NEGATIVE 07/10/2018 1707   HGBUR NEGATIVE 07/10/2018  1707   BILIRUBINUR NEGATIVE 07/10/2018 1707   KETONESUR NEGATIVE 07/10/2018 1707   PROTEINUR NEGATIVE 07/10/2018 1707   NITRITE NEGATIVE 07/10/2018 1707   LEUKOCYTESUR NEGATIVE 07/10/2018 1707      Radiographic Studies: DG Chest 1 View  Result Date: 12/12/2020 CLINICAL DATA:  Cough and congestion for 1 week. EXAM: CHEST  1 VIEW COMPARISON:  09/29/2018 FINDINGS: The cardiac silhouette, mediastinal and hilar contours are within normal limits. Mild tortuosity and calcification of the thoracic aorta. There are patchy bibasilar infiltrates. No pleural effusions or pneumothorax. The bony thorax is intact. Remote surgical changes involving the right shoulder. IMPRESSION: Patchy bibasilar infiltrates. Electronically Signed   By: Rudie Meyer M.D.   On: 12/12/2020 09:11    EKG: Independently reviewed.  Normal sinus rhythm, left axis deviation   Assessment/Plan:   Principal Problem:   Pneumonia due to COVID-19 virus Active Problems:   Generalized weakness   Unsteady gait    Body mass index is 27.27 kg/m.    COVID-19 pneumonia: Admit to MedSurg.  Monitor on telemetry because she is at risk for decompensation.  Treat with IV remdesivir.  No need for steroids since she is not hypoxic.  Poor oral intake and dehydration: Hydrate with IV fluids.  Generalized weakness/unsteady gait: PT and OT evaluation  Chronic back pain: Continue analgesics   Other information:   DVT prophylaxis: enoxaparin (LOVENOX) injection 40 mg Start: 12/12/20 2200 SCDs Start: 12/12/20 1433  Code Status: Full code. Family Communication: None Disposition Plan: Possible discharge home tomorrow Consults called: None Admission status: Observation  The medical decision making on this patient was of high complexity and the patient is at high risk for clinical deterioration, therefore this is a level 3 visit.    Time spent 55 minutes  Evren Shankland [facility] Pager: Please check  [email or web address]   How to contact the [facility] Attending or Consulting provider  7A - 7P or covering provider during after hours 7P -7A, for this patient?   1. Check the care team in [facility] and look for a) attending/consulting [facility] provider listed and b) the [facility] team listed 2. Log into [email or web address] and use [place]'s universal password to access. If you do not have the password, please contact the hospital operator. 3. Locate the [facility] provider you are looking for under [facility] and page to a number that you can be directly reached. 4. If you still have difficulty reaching the provider, please page the [facility] (Director on Call) for the Hospitalists listed on amion for assistance.  12/12/2020, 2:59 PM

## 2020-12-12 NOTE — Progress Notes (Signed)
Pharmacy Consult - Remdesivir  69 yo female presenting COVID-19 positive with respiratory symptoms requiring hospitalization. Pharmacy consulted to dose Remdesivir.    Plan: Remdesivir 200mg  IV x 1; then 100mg  IV q24h to complete 5 total doses Monitor clinical progress

## 2020-12-12 NOTE — ED Provider Notes (Signed)
[facility] Emergency Department Provider Note  ____________________________________________  Time seen: Approximately 10:11 AM  I have reviewed the triage vital signs and the nursing notes.   HISTORY  Chief Complaint Cough    HPI Maria Hamilton is a 69 y.o. female that presents to the emergency department for evaluation of fever and cough for 2 weeks.  Patient states that she has developed increasing weakness.  She does not feel like she can get out of bed.  She has not eaten or had much to drink in several days.  Patient has not had a Covid vaccination.  No sick contacts.  No shortness breath, chest pain, vomiting.  Past Medical History:  Diagnosis Date  . Acute anxiety 08/03/2015   Overview:  Last Assessment & Plan:  Klonopin most helpful  Reports past taking it 1 tab am 1/2 tab midday and 1 tab pm  30 day supply faxed to pharmacy  . Acute bronchitis 12/31/2016  . Acute pancreatitis 10/14/2016  . Altered mental status 10/12/2016  . Anxiety   . Bronchitis   . Chronic hip pain   . Chronic pain syndrome   . Depression   . Edema   . Fatigue   . Fibromyalgia   . GERD (gastroesophageal reflux disease)   . Headache   . Hypertension   . Insomnia   . Low back pain   . Right upper quadrant abdominal pain   . Vitamin D deficiency     Patient Active Problem List   Diagnosis Date Noted  . Pneumonia due to COVID-19 virus 12/12/2020  . Falls 05/02/2020  . Muscle cramps 05/02/2020  . Hot flashes 01/29/2020  . Leg swelling 01/29/2020  . Left foot pain 01/29/2020  . Left shoulder pain 01/29/2020  . Leg edema 01/12/2020  . Toe swelling 08/07/2019  . Numbness and tingling of foot 11/19/2018  . Hot flashes due to menopause 11/19/2018  . Spondylosis without myelopathy or radiculopathy, lumbosacral region 04/28/2018  . Chronic hip pain after total replacement of hip joint (Left) 04/28/2018  . Chronic hip pain after total replacement of hip joint (Right)  04/28/2018  . Chronic pain of hip  (Bilateral) (L>R) 04/28/2018  . Chronic sacroiliac joint pain (Bilateral) (L>R) 04/28/2018  . Lumbar facet syndrome (Bilateral) (L>R) 04/28/2018  . Vitamin B 12 deficiency 04/02/2018  . Pain of both shoulder joints 04/02/2018  . Degeneration of lumbar intervertebral disc 10/30/2017  . Osteoarthritis of hip 10/30/2017  . History of allergy to radiographic contrast media 10/29/2017  . Ankle pain 10/16/2017  . Anxiety and depression 10/16/2017  . Viral URI with cough 10/16/2017  . RLS (restless legs syndrome) 10/10/2017  . Night sweats 08/13/2017  . Chronic foot numbness (Left) 04/02/2017  . Vertigo 03/25/2017  . 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) 03/20/2017  . History of lumbar fusion (L3-4, L4-5, and L5-S1) 03/20/2017  . Post herpetic neuralgia 03/12/2017  . Abnormal MRI, lumbar spine (03/06/2017) 03/12/2017  . Memory difficulty 03/06/2017  . Polyneuropathy 03/06/2017  . Palpitations 11/26/2016  . Epigastric pain 11/26/2016  . Therapeutic opioid-induced constipation (OIC) 11/20/2016  . Constipation   . Hypokalemia 09/27/2016  . Long term current use of opiate analgesic 09/24/2016  . Long term prescription opiate use 09/24/2016  . Opiate use 09/24/2016  . Encounter for therapeutic drug level monitoring 09/24/2016  . Encounter for pain management planning 09/24/2016  . Rheumatoid arthritis, multiple sites (positive RF) 09/24/2016  . Chronic lower extremity pain (Secondary source of pain) (  Left) 09/24/2016  . Lumbar facet arthropathy (Bilateral) 09/24/2016  . Failed back surgical syndrome (x 3) 09/24/2016  . Epidural fibrosis 09/24/2016  . Neurogenic pain 09/24/2016  . Musculoskeletal pain 09/24/2016  . Chronic lumbar radicular pain (L5/S1 dermatome) (Left) 09/24/2016  . History of total hip replacement, bilateral 09/24/2016  . GERD (gastroesophageal reflux disease) 08/03/2015  . Rheumatoid factor positive 08/03/2015  .  Chronic fatigue 08/03/2015  . Vitamin D deficiency 08/03/2015  . Insomnia 08/03/2015  . Fibromyalgia 08/03/2015  . Chronic low back pain (Primary Source of Pain) (Left) 08/03/2015  . Chronic pain syndrome 08/03/2015  . Depression 08/03/2015  . Hypertension 08/03/2015  . Acute anxiety 08/03/2015  . 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) 04/22/2012    Past Surgical History:  Procedure Laterality Date  . ABDOMINAL HYSTERECTOMY    . BACK SURGERY    . BREAST BIOPSY Left 90s   benign  . BREAST SURGERY    . CHOLECYSTECTOMY N/A 10/16/2016   Procedure: LAPAROSCOPIC CHOLECYSTECTOMY WITH INTRAOPERATIVE CHOLANGIOGRAM;  Surgeon: Tiney Rouge III, MD;  Location: [facility] ORS;  Service: General;  Laterality: N/A;  . cyst removal from wrist    . ECTOPIC PREGNANCY SURGERY    . HIP SURGERY     x4  . OOPHORECTOMY      Prior to Admission medications   Medication Sig Start Date End Date Taking? Authorizing Provider  amLODipine (NORVASC) 10 MG tablet TAKE 1 TABLET EVERY DAY 10/31/20   Glori Luis, MD  calcium carbonate (OSCAL) 1500 (600 Ca) MG TABS tablet Take 600 mg of elemental calcium by mouth daily with breakfast.    [provider]  carvedilol (COREG) 12.5 MG tablet Take 1 tablet (12.5 mg total) by mouth 2 (two) times daily with a meal. 05/02/20   Glori Luis, MD  cholecalciferol (VITAMIN D) 1000 units tablet Take 1,000 Units by mouth daily.    [provider]  escitalopram (LEXAPRO) 10 MG tablet TAKE 1 TABLET EVERY DAY 08/26/20   Glori Luis, MD  gabapentin (NEURONTIN) 300 MG capsule Take 1 capsule (300 mg total) by mouth in the morning AND 2 capsules (600 mg total) at bedtime. 10/31/20   Glori Luis, MD  Magnesium 250 MG TABS Take by mouth at bedtime.    [provider]  potassium chloride SA (KLOR-CON) 20 MEQ tablet TAKE 1 TABLET EVERY DAY 10/31/20   Glori Luis, MD  rosuvastatin (CRESTOR) 20 MG tablet TAKE 1 TABLET  EVERY DAY 08/17/20   Glori Luis, MD  spironolactone (ALDACTONE) 25 MG tablet Take 0.5 tablets (12.5 mg total) by mouth daily. In am 09/15/20   Glori Luis, MD  tiZANidine (ZANAFLEX) 2 MG tablet Take 2 tablets (4 mg total) by mouth at bedtime as needed for muscle spasms. 10/21/20   Glori Luis, MD  triamcinolone ointment (KENALOG) 0.5 % Apply 1 application topically 2 (two) times daily. Prn Left neck and right inner arm x1-2 weeks prn 07/20/20   McLean-Scocuzza, Pasty Spillers, MD    Allergies Ace inhibitors, Ciprofloxacin, Fentanyl, Hctz [hydrochlorothiazide], Latex, Morphine and related, Penicillin g benzathine, Isovue m [iopamidol], and Tape  Family History  Problem Relation Age of Onset  . Heart disease Mother   . Cancer Father   . Alcohol abuse Father     Social History Social History   Tobacco Use  . Smoking status: Never Smoker  . Smokeless tobacco: Never Used  Substance Use Topics  . Alcohol use: No  Alcohol/week: 0.0 standard drinks  . Drug use: No     Review of Systems  Constitutional: Positive for fever Eyes: No visual changes. No discharge. ENT: Positive for congestion and rhinorrhea. Cardiovascular: No chest pain. Respiratory: Positive for cough. No SOB. Gastrointestinal: No abdominal pain.  No nausea, no vomiting.  No diarrhea.  No constipation. Musculoskeletal: Negative for musculoskeletal pain. Skin: Negative for rash, abrasions, lacerations, ecchymosis. Neurological: Negative for headaches.   ____________________________________________   PHYSICAL EXAM:  VITAL SIGNS: ED Triage Vitals  Enc Vitals Group     BP 12/12/20 0810 120/69     Pulse Rate 12/12/20 0810 73     Resp 12/12/20 0810 18     Temp 12/12/20 0810 100.2 F (37.9 C)     Temp Source 12/12/20 0810 Oral     SpO2 12/12/20 0810 98 %     Weight 12/12/20 0811 201 lb 1 oz (91.2 kg)     Height 12/12/20 0811 6' (1.829 m)     Head Circumference --      Peak Flow --      Pain Score  12/12/20 0811 7     Pain Loc --      Pain Edu? --      Excl. in GC? --      Constitutional: Alert and oriented. No acute distress. Eyes: Conjunctivae are normal. PERRL. EOMI. No discharge. Head: Atraumatic. ENT: No frontal and maxillary sinus tenderness.      Ears: Tympanic membranes pearly gray with good landmarks. No discharge.      Nose: No congestion/rhinnorhea.      Mouth/Throat: Mucous membranes are moist. Oropharynx non-erythematous. Tonsils not enlarged. No exudates. Uvula midline. Neck: No stridor.   Hematological/Lymphatic/Immunilogical: No cervical lymphadenopathy. Cardiovascular: Normal rate, regular rhythm.  Good peripheral circulation. Respiratory: Normal respiratory effort without tachypnea or retractions. Lungs CTAB. Good air entry to the bases with no decreased or absent breath sounds. Gastrointestinal: Bowel sounds 4 quadrants. Soft and nontender to palpation. No guarding or rigidity. No palpable masses. No distention. Musculoskeletal: Full range of motion to all extremities. No gross deformities appreciated. Neurologic:  Normal speech and language. No gross focal neurologic deficits are appreciated.  Skin:  Skin is warm, dry and intact. No rash noted.    ____________________________________________   LABS (all labs ordered are listed, but only abnormal results are displayed)  Labs Reviewed  RESP PANEL BY RT-PCR (FLU A&B, COVID) ARPGX2 - Abnormal; Notable for the following components:      Result Value   SARS Coronavirus 2 by RT PCR POSITIVE (*)    All other components within normal limits  COMPREHENSIVE METABOLIC PANEL - Abnormal; Notable for the following components:   Sodium 134 (*)    Calcium 8.7 (*)    All other components within normal limits  CBC  POC SARS CORONAVIRUS 2 AG -  ED  TROPONIN I (HIGH SENSITIVITY)   ____________________________________________  EKG   ____________________________________________  RADIOLOGY Lexine Baton,  personally viewed and evaluated these images (plain radiographs) as part of my medical decision making, as well as reviewing the written report by the radiologist.  DG Chest 1 View  Result Date: 12/12/2020 CLINICAL DATA:  Cough and congestion for 1 week. EXAM: CHEST  1 VIEW COMPARISON:  09/29/2018 FINDINGS: The cardiac silhouette, mediastinal and hilar contours are within normal limits. Mild tortuosity and calcification of the thoracic aorta. There are patchy bibasilar infiltrates. No pleural effusions or pneumothorax. The bony thorax is intact. Remote surgical changes involving  the right shoulder. IMPRESSION: Patchy bibasilar infiltrates. Electronically Signed   By: Marijo Sanes M.D.   On: 12/12/2020 09:11    ____________________________________________    PROCEDURES  Procedure(s) performed:    Procedures    Medications  sodium chloride 0.9 % bolus 1,000 mL (has no administration in time range)  benzonatate (TESSALON) capsule 100 mg (has no administration in time range)     ____________________________________________   INITIAL IMPRESSION / ASSESSMENT AND PLAN / ED COURSE  Pertinent labs & imaging results that were available during my care of the patient were reviewed by me and considered in my medical decision making (see chart for details).  Review of the [place] CSRS was performed in accordance of the [place] prior to dispensing any controlled drugs.   Patient's diagnosis is consistent with COVID-19. Vital signs and exam are reassuring.  Lab work is largely unremarkable.  Covid test is positive.  Chest x-ray shows multifocal pneumonia.  Patient has been unable to get out of bed while in the emergency department.  She almost fell meltable times while trying to ambulate with 2 RNs.  See RN note.  Patient will be admitted to the hospital for weakness and COVID-19 pneumonia.  Hospitalist is agreeable.   Akisha Sturgill was evaluated in Emergency Department on 12/12/2020 for the  symptoms described in the history of present illness. She was evaluated in the context of the global COVID-19 pandemic, which necessitated consideration that the patient might be at risk for infection with the SARS-CoV-2 virus that causes COVID-19. Institutional protocols and algorithms that pertain to the evaluation of patients at risk for COVID-19 are in a state of rapid change based on information released by regulatory bodies including the CDC and federal and state organizations. These policies and algorithms were followed during the patient's care in the ED.  ____________________________________________  FINAL CLINICAL IMPRESSION(S) / ED DIAGNOSES  Final diagnoses:  Pneumonia due to COVID-19 virus  Weakness      NEW MEDICATIONS STARTED DURING THIS VISIT:  ED Discharge Orders    None          This chart was dictated using voice recognition software/Dragon. Despite best efforts to proofread, errors can occur which can change the meaning. Any change was purely unintentional.    Laban Emperor, PA-C 12/12/20 1427    Lucrezia Starch, MD 12/12/20 2154

## 2020-12-13 ENCOUNTER — Telehealth: Payer: Medicare HMO | Admitting: Family Medicine

## 2020-12-13 DIAGNOSIS — R531 Weakness: Secondary | ICD-10-CM | POA: Diagnosis not present

## 2020-12-13 DIAGNOSIS — U071 COVID-19: Secondary | ICD-10-CM | POA: Diagnosis not present

## 2020-12-13 DIAGNOSIS — J1282 Pneumonia due to coronavirus disease 2019: Secondary | ICD-10-CM | POA: Diagnosis not present

## 2020-12-13 DIAGNOSIS — R2681 Unsteadiness on feet: Secondary | ICD-10-CM | POA: Diagnosis not present

## 2020-12-13 LAB — FIBRIN DERIVATIVES D-DIMER (ARMC ONLY): Fibrin derivatives D-dimer (ARMC): 1362.79 ng/mL (FEU) — ABNORMAL HIGH (ref 0.00–499.00)

## 2020-12-13 LAB — COMPREHENSIVE METABOLIC PANEL
ALT: 16 U/L (ref 0–44)
AST: 31 U/L (ref 15–41)
Albumin: 3.4 g/dL — ABNORMAL LOW (ref 3.5–5.0)
Alkaline Phosphatase: 66 U/L (ref 38–126)
Anion gap: 8 (ref 5–15)
BUN: 11 mg/dL (ref 8–23)
CO2: 25 mmol/L (ref 22–32)
Calcium: 8.6 mg/dL — ABNORMAL LOW (ref 8.9–10.3)
Chloride: 104 mmol/L (ref 98–111)
Creatinine, Ser: 0.71 mg/dL (ref 0.44–1.00)
GFR, Estimated: >60 mL/min (ref >60)
Glucose, Bld: 106 mg/dL — ABNORMAL HIGH (ref 70–99)
Potassium: 3.9 mmol/L (ref 3.5–5.1)
Sodium: 137 mmol/L (ref 135–145)
Total Bilirubin: 0.4 mg/dL (ref 0.3–1.2)
Total Protein: 7.4 g/dL (ref 6.5–8.1)

## 2020-12-13 LAB — CBC WITH DIFFERENTIAL/PLATELET
Abs Immature Granulocytes: 0.01 10*3/uL (ref 0.00–0.07)
Basophils Absolute: 0 10*3/uL (ref 0.0–0.1)
Basophils Relative: 0 %
Eosinophils Absolute: 0 10*3/uL (ref 0.0–0.5)
Eosinophils Relative: 0 %
HCT: 37 % (ref 36.0–46.0)
Hemoglobin: 12.4 g/dL (ref 12.0–15.0)
Immature Granulocytes: 0 %
Lymphocytes Relative: 31 %
Lymphs Abs: 0.7 10*3/uL (ref 0.7–4.0)
MCH: 28.7 pg (ref 26.0–34.0)
MCHC: 33.5 g/dL (ref 30.0–36.0)
MCV: 85.6 fL (ref 80.0–100.0)
Monocytes Absolute: 0.2 10*3/uL (ref 0.1–1.0)
Monocytes Relative: 9 %
Neutro Abs: 1.4 10*3/uL — ABNORMAL LOW (ref 1.7–7.7)
Neutrophils Relative %: 60 %
Platelets: 159 10*3/uL (ref 150–400)
RBC: 4.32 MIL/uL (ref 3.87–5.11)
RDW: 12.9 % (ref 11.5–15.5)
WBC: 2.4 10*3/uL — ABNORMAL LOW (ref 4.0–10.5)
nRBC: 0 % (ref 0.0–0.2)

## 2020-12-13 LAB — C-REACTIVE PROTEIN: CRP: 1.4 mg/dL — ABNORMAL HIGH (ref <1.0)

## 2020-12-13 LAB — FERRITIN: Ferritin: 163 ng/mL (ref 11–307)

## 2020-12-13 LAB — MAGNESIUM: Magnesium: 1.9 mg/dL (ref 1.7–2.4)

## 2020-12-13 LAB — PHOSPHORUS: Phosphorus: 2.5 mg/dL (ref 2.5–4.6)

## 2020-12-13 MED ORDER — ROSUVASTATIN CALCIUM 20 MG PO TABS
20.0000 mg | ORAL_TABLET | Freq: Every day | ORAL | Status: DC
  Administered 2020-12-13 – 2020-12-16 (×4): 20 mg via ORAL
  Filled 2020-12-13 (×4): qty 1

## 2020-12-13 MED ORDER — ESCITALOPRAM OXALATE 10 MG PO TABS
10.0000 mg | ORAL_TABLET | Freq: Every day | ORAL | Status: DC
  Administered 2020-12-13 – 2020-12-16 (×4): 10 mg via ORAL
  Filled 2020-12-13 (×4): qty 1

## 2020-12-13 MED ORDER — ADULT MULTIVITAMIN W/MINERALS CH
1.0000 | ORAL_TABLET | Freq: Every day | ORAL | Status: DC
  Administered 2020-12-14 – 2020-12-16 (×3): 1 via ORAL
  Filled 2020-12-13 (×3): qty 1

## 2020-12-13 MED ORDER — CALCIUM CARBONATE 1250 (500 CA) MG PO TABS
500.0000 mg | ORAL_TABLET | Freq: Every day | ORAL | Status: DC
  Administered 2020-12-13 – 2020-12-16 (×4): 500 mg via ORAL
  Filled 2020-12-13 (×4): qty 1

## 2020-12-13 MED ORDER — MELATONIN 5 MG PO TABS
5.0000 mg | ORAL_TABLET | Freq: Every day | ORAL | Status: DC
  Administered 2020-12-13 – 2020-12-15 (×3): 5 mg via ORAL
  Filled 2020-12-13 (×4): qty 1

## 2020-12-13 MED ORDER — DEXTROSE IN LACTATED RINGERS 5 % IV SOLN: INTRAVENOUS | Status: AC

## 2020-12-13 MED ORDER — GABAPENTIN 100 MG PO CAPS
100.0000 mg | ORAL_CAPSULE | Freq: Two times a day (BID) | ORAL | Status: DC
  Administered 2020-12-13 – 2020-12-16 (×7): 100 mg via ORAL
  Filled 2020-12-13 (×7): qty 1

## 2020-12-13 MED ORDER — ENSURE ENLIVE PO LIQD
237.0000 mL | Freq: Two times a day (BID) | ORAL | Status: DC
  Administered 2020-12-14 – 2020-12-15 (×2): 237 mL via ORAL

## 2020-12-13 MED ORDER — CARVEDILOL 6.25 MG PO TABS
12.5000 mg | ORAL_TABLET | Freq: Two times a day (BID) | ORAL | Status: DC
  Administered 2020-12-13 – 2020-12-16 (×4): 12.5 mg via ORAL
  Filled 2020-12-13 (×3): qty 2

## 2020-12-13 MED ORDER — VITAMIN D 25 MCG (1000 UNIT) PO TABS
1000.0000 [IU] | ORAL_TABLET | Freq: Every day | ORAL | Status: DC
  Administered 2020-12-13 – 2020-12-16 (×4): 1000 [IU] via ORAL
  Filled 2020-12-13 (×4): qty 1

## 2020-12-13 MED ORDER — TIZANIDINE HCL 4 MG PO TABS
4.0000 mg | ORAL_TABLET | Freq: Every evening | ORAL | Status: DC | PRN
  Administered 2020-12-13 – 2020-12-15 (×3): 4 mg via ORAL
  Filled 2020-12-13 (×4): qty 1

## 2020-12-13 MED ORDER — PHENOL 1.4 % MT LIQD
1.0000 | OROMUCOSAL | Status: DC | PRN
  Administered 2020-12-13: 12:00:00 1 via OROMUCOSAL
  Filled 2020-12-13 (×2): qty 177

## 2020-12-13 NOTE — Progress Notes (Signed)
Initial Nutrition Assessment  DOCUMENTATION CODES:   Not applicable  INTERVENTION:   Liberalize diet to allow for more meal choices  Ensure Enlive po BID, each supplement provides 350 kcal and 20 grams of protein  MVI with minerals   NUTRITION DIAGNOSIS:   Inadequate oral intake related to acute illness (COVID-19) as evidenced by per patient/family report.  GOAL:   Patient will meet greater than or equal to 90% of their needs  MONITOR:   PO intake,Supplement acceptance,Labs  REASON FOR ASSESSMENT:   Malnutrition Screening Tool    ASSESSMENT:   69 y.o. female with PMH of depression, chronic back pain s/p multiple surgeries, fibromyalgia, GERD, HTN, insomnia, and vitamin D deficiency. Presented with worsening cough, poor po intake, and generalized weakness. Tested positive for COVID-19 infection.   Pt reports her appetite has been decreased for about a week since becoming sick. She reports she has ate barely anything during this past week. She reports her appetite was good before becoming sick, although she reports she eats about 2 meals a day, which is normal for her. She reports when she does eat, she does not throw up or feel nauseous. No documented meal records during this admission. Pt denies drinking any ONS at home. Brief education provided on importance of getting adequate calories and protein during COVID-19 diagnosis. Pt is agreeable to trying protein drinks while admitted.   Pt reports no recent weight loss that she is aware of. She reports her UBW is around 200 lbs. Per chart review, patient's weights appear stable with no indication of significant weight changes.   Labs reviewed.   Medications reviewed and include: Cholecalciferol, D5 at 75 mL/hr, Neurotonin    NUTRITION - FOCUSED PHYSICAL EXAM:  Flowsheet Row Most Recent Value  Orbital Region No depletion  Upper Arm Region No depletion  Thoracic and Lumbar Region No depletion  Buccal Region No depletion   Temple Region No depletion  Clavicle Bone Region Mild depletion  Clavicle and Acromion Bone Region Mild depletion  Scapular Bone Region Unable to assess  Dorsal Hand No depletion  Patellar Region No depletion  Anterior Thigh Region No depletion  Posterior Calf Region No depletion  Edema (RD Assessment) None  Hair Unable to assess  Thea Alken cover]  Eyes Reviewed  Mouth Reviewed  Skin Reviewed  Nails Reviewed       Diet Order:   Diet Order            Diet Heart Room service appropriate? Yes; Fluid consistency: Thin  Diet effective now                 EDUCATION NEEDS:   Education needs have been addressed  Skin:  Skin Assessment: Reviewed RN Assessment  Last BM:  12/27  Height:   Ht Readings from Last 1 Encounters:  12/12/20 6' (1.829 m)    Weight:   Wt Readings from Last 1 Encounters:  12/12/20 91.2 kg    BMI:  Body mass index is 27.27 kg/m.  Estimated Nutritional Needs:   Kcal:  2000-2200 kcal  Protein:  115-130 grams  Fluid:  >2 L/day    Placido Sou, Dietetic Intern Pager: [phone] If unavailable: [phone]

## 2020-12-13 NOTE — Progress Notes (Addendum)
Progress Note    Maria Hamilton  ZOX:[id number] DOB: 1951/07/05  DOA: 12/12/2020 PCP: Glori Luis, MD      Brief Narrative:    Medical records reviewed and are as summarized below:  Maria Hamilton is a 69 y.o. female with multiple medical problems including but not limited to depression, chronic back pain status post multiple back surgeries, fibromyalgia, chronic pain syndrome, GERD, headache, hypertension, insomnia, vitamin D deficiency, history of acute bronchitis, history of acute pancreatitis.  She presented to the hospital because of cough.  She said she has been coughing for over a week.  He has some chills.  Her symptoms were associated with generalized weakness, fatigue and unsteady gait.  She tested positive for COVID-19 infection.  She is unvaccinated against Covid.Marland Kitchen  She was admitted to the hospital for COVID-19 pneumonia.  She was treated with IV remdesivir.  She was given IV fluids for poor oral intake and dehydration.    Assessment/Plan:   Principal Problem:   Pneumonia due to COVID-19 virus Active Problems:   Generalized weakness   Unsteady gait   Nutrition Problem: Inadequate oral intake Etiology: acute illness (COVID-19)  Signs/Symptoms: per patient/family report   Body mass index is 27.27 kg/m.     COVID-19 pneumonia: Continue IV remdesivir.   She is tolerating room air.  Poor oral intake, diarrhea and dehydration: Continue IV fluids for hydration.  Monitor BMP.  Generalized weakness/unsteady gait: Continue PT and OT.  Chronic back pain: Continue analgesics  Hypertension: Hold amlodipine.  Continue home dose of Coreg.  Leukopenia: This is likely due to coronavirus infection.  Monitor CBC.   Diet Order            Diet Heart Room service appropriate? Yes; Fluid consistency: Thin  Diet effective now                    Consultants:  Nnone  Procedures:  None    Medications:   . calcium carbonate  500 mg of  elemental calcium Oral Q breakfast  . carvedilol  12.5 mg Oral BID WC  . cholecalciferol  1,000 Units Oral Daily  . enoxaparin (LOVENOX) injection  40 mg Subcutaneous Q24H  . escitalopram  10 mg Oral Daily  . gabapentin  100 mg Oral BID  . influenza vaccine adjuvanted  0.5 mL Intramuscular Tomorrow-1000  . rosuvastatin  20 mg Oral Daily   Continuous Infusions: . dextrose 5% lactated ringers    . remdesivir 100 mg in NS 100 mL 100 mg (12/13/20 0848)     Anti-infectives (From admission, onward)   Start     Dose/Rate Route Frequency Ordered Stop   12/13/20 1000  remdesivir 100 mg in sodium chloride 0.9 % 100 mL IVPB       "Followed by" Linked Group Details   100 mg 200 mL/hr over 30 Minutes Intravenous Daily 12/12/20 1435 12/17/20 0959   12/12/20 1500  remdesivir 200 mg in sodium chloride 0.9% 250 mL IVPB       "Followed by" Linked Group Details   200 mg 580 mL/hr over 30 Minutes Intravenous Once 12/12/20 1435 12/12/20 1639             Family Communication/Anticipated D/C date and plan/Code Status   DVT prophylaxis: enoxaparin (LOVENOX) injection 40 mg Start: 12/12/20 2200 SCDs Start: 12/12/20 1433     Code Status: Full Code  Family Communication: Plan discussed with her husband over the phone Disposition Plan:  Status is: Observation  The patient will require care spanning > 2 midnights and should be moved to inpatient because: IV treatments appropriate due to intensity of illness or inability to take PO  Dispo: The patient is from: Home              Anticipated d/c is to: Home              Anticipated d/c date is: 2 days              Patient currently is not medically stable to d/c.           Subjective:   Interval events noted.  She had a temperature of 102.2 F yesterday afternoon.  She complains of cough, diarrhea, sore throat and generalized weakness.  Objective:    Vitals:   12/13/20 0021 12/13/20 0431 12/13/20 0827 12/13/20 1158  BP:  115/71 (!) 145/83 134/65 135/66  Pulse: 64 66 68 62  Resp: 16 16 16 18   Temp: 98.5 F (36.9 C) 98.8 F (37.1 C) 99.4 F (37.4 C) 98.8 F (37.1 C)  TempSrc: Oral Oral    SpO2: 97% 99% 98% 99%  Weight:      Height:       No data found.   Intake/Output Summary (Last 24 hours) at 12/13/2020 1419 Last data filed at 12/13/2020 0456 Gross per 24 hour  Intake 1660.7 ml  Output 100 ml  Net 1560.7 ml   Filed Weights   12/12/20 0811  Weight: 91.2 kg    Exam:  GEN: NAD SKIN: No rash EYES: EOMI ENT: MMM CV: RRR PULM: CTA B ABD: soft, ND, NT, +BS CNS: AAO x 3, non focal EXT: No edema or tenderness   Data Reviewed:   I have personally reviewed following labs and imaging studies:  Labs: Labs show the following:   Basic Metabolic Panel: Recent Labs  Lab 12/12/20 1055 12/13/20 0606  NA 134* 137  K 3.9 3.9  CL 99 104  CO2 25 25  GLUCOSE 94 106*  BUN 12 11  CREATININE 0.87 0.71  CALCIUM 8.7* 8.6*  MG  --  1.9  PHOS  --  2.5   GFR Estimated Creatinine Clearance: 84.1 mL/min (by C-G formula based on SCr of 0.71 mg/dL). Liver Function Tests: Recent Labs  Lab 12/12/20 1055 12/13/20 0606  AST 34 31  ALT 16 16  ALKPHOS 70 66  BILITOT 0.4 0.4  PROT 8.1 7.4  ALBUMIN 3.8 3.4*   No results for input(s): LIPASE, AMYLASE in the last 168 hours. No results for input(s): AMMONIA in the last 168 hours. Coagulation profile No results for input(s): INR, PROTIME in the last 168 hours.  CBC: Recent Labs  Lab 12/12/20 1055 12/13/20 0606  WBC 4.3 2.4*  NEUTROABS  --  1.4*  HGB 13.0 12.4  HCT 40.1 37.0  MCV 87.6 85.6  PLT 184 159   Cardiac Enzymes: No results for input(s): CKTOTAL, CKMB, CKMBINDEX, TROPONINI in the last 168 hours. BNP (last 3 results) No results for input(s): PROBNP in the last 8760 hours. CBG: No results for input(s): GLUCAP in the last 168 hours. D-Dimer: No results for input(s): DDIMER in the last 72 hours. Hgb A1c: No results for  input(s): HGBA1C in the last 72 hours. Lipid Profile: No results for input(s): CHOL, HDL, LDLCALC, TRIG, CHOLHDL, LDLDIRECT in the last 72 hours. Thyroid function studies: No results for input(s): TSH, T4TOTAL, T3FREE, THYROIDAB in the last 72 hours.  Invalid  input(s): FREET3 Anemia work up: Recent Labs    12/12/20 1640 12/13/20 0606  FERRITIN 162 163   Sepsis Labs: Recent Labs  Lab 12/12/20 1055 12/13/20 0606  WBC 4.3 2.4*    Microbiology Recent Results (from the past 240 hour(s))  Resp Panel by RT-PCR (Flu A&B, Covid) Nasopharyngeal Swab     Status: Abnormal   Collection Time: 12/12/20  8:13 AM   Specimen: Nasopharyngeal Swab; Nasopharyngeal(NP) swabs in vial transport medium  Result Value Ref Range Status   SARS Coronavirus 2 by RT PCR POSITIVE (A) NEGATIVE Final    Comment: RESULT CALLED TO, READ BACK BY AND VERIFIED WITH: LORRIE LEMONS AT 0953 ON 12/12/2020 [facility]. (NOTE) SARS-CoV-2 target nucleic acids are DETECTED.  The SARS-CoV-2 RNA is generally detectable in upper respiratory specimens during the acute phase of infection. Positive results are indicative of the presence of the identified virus, but do not rule out bacterial infection or co-infection with other pathogens not detected by the test. Clinical correlation with patient history and other diagnostic information is necessary to determine patient infection status. The expected result is Negative.  Fact Sheet for Patients: BloggerCourse.com  Fact Sheet for Healthcare Providers: SeriousBroker.it  This test is not yet approved or cleared by the [place] FDA and  has been authorized for detection and/or diagnosis of SARS-CoV-2 by FDA under an Emergency Use Authorization (EUA).  This EUA will remain in effect (meaning this test  can be used) for the duration of  the COVID-19 declaration under Section 564(b)(1) of the Act, 21 U.S.C. section  360bbb-3(b)(1), unless the authorization is terminated or revoked sooner.     Influenza A by PCR NEGATIVE NEGATIVE Final   Influenza B by PCR NEGATIVE NEGATIVE Final    Comment: (NOTE) The Xpert Xpress SARS-CoV-2/FLU/RSV plus assay is intended as an aid in the diagnosis of influenza from Nasopharyngeal swab specimens and should not be used as a sole basis for treatment. Nasal washings and aspirates are unacceptable for Xpert Xpress SARS-CoV-2/FLU/RSV testing.  Fact Sheet for Patients: BloggerCourse.com  Fact Sheet for Healthcare Providers: SeriousBroker.it  This test is not yet approved or cleared by the [place] FDA and has been authorized for detection and/or diagnosis of SARS-CoV-2 by FDA under an Emergency Use Authorization (EUA). This EUA will remain in effect (meaning this test can be used) for the duration of the COVID-19 declaration under Section 564(b)(1) of the Act, 21 U.S.C. section 360bbb-3(b)(1), unless the authorization is terminated or revoked.  Performed at [facility], [address] [place], [place] [address]     Procedures and diagnostic studies:  DG Chest 1 View  Result Date: 12/12/2020 CLINICAL DATA:  Cough and congestion for 1 week. EXAM: CHEST  1 VIEW COMPARISON:  09/29/2018 FINDINGS: The cardiac silhouette, mediastinal and hilar contours are within normal limits. Mild tortuosity and calcification of the thoracic aorta. There are patchy bibasilar infiltrates. No pleural effusions or pneumothorax. The bony thorax is intact. Remote surgical changes involving the right shoulder. IMPRESSION: Patchy bibasilar infiltrates. Electronically Signed   By: Rudie Meyer M.D.   On: 12/12/2020 09:11               LOS: 0 days   Livia Tarr  [facility]   Pager on [email or web address]. If 7PM-7AM, please contact night-coverage at [email or web address]     12/13/2020, 2:19 PM

## 2020-12-13 NOTE — Progress Notes (Signed)
PT Cancellation Note  Patient Details Name: Maria Hamilton MRN: [id number] DOB: [birth year]   Cancelled Treatment:    Reason Eval/Treat Not Completed: Patient declined to participate with PT services this date secondary to fatigue and requested to attempt PT evaluation tomorrow. Will attempt to see pt at a future date/time as medically appropriate.     Ovidio Hanger PT, DPT 12/13/20, 4:04 PM

## 2020-12-13 NOTE — Care Management Obs Status (Signed)
MEDICARE OBSERVATION STATUS NOTIFICATION   Patient Details  Name: Maria Hamilton MRN: [id number] Date of Birth: 10/26/1951   Medicare Observation Status Notification Given:  Yes (Will place on chart for patient to take home with her.)    Margarito Liner, LCSW 12/13/2020, 3:02 PM

## 2020-12-13 NOTE — Evaluation (Signed)
Occupational Therapy Evaluation Patient Details Name: Maria Hamilton MRN: [id number] DOB: 1951-10-07 Today's Date: 12/13/2020    History of Present Illness Pt is a 69 y/o F with PMH including but not limited to depression, chronic back pain status post multiple back surgeries, fibromyalgia, chronic pain syndrome, GERD, headache, hypertension, insomnia, vitamin D deficiency, history of acute bronchitis, history of acute pancreatitis.  She presented to the hospital because progressively worsening cough >1 week. Pt found to be COVID + and CXR suggestive of b/l PNA.   Clinical Impression   Pt seen for OT evaluation this date in setting of acute hospitalization with COVID PNA. Pt is pleasant and agreeable to session, but c/o feeling poorly throughout session which is also demonstrated in her body language. Pt reports good support at home including spouse, granddtr and 2 great-grand children. Indicates that someone is always available to help around the clock if needed. Pt lives in [facility] with 4 STE with R railing. Reports she has had walkers from previous hip surgery, but does not use. Reports being INDEP with all I/ADLs including driving and running errands. Pt presents this date with decreased strength and functional activity tolerance. Pt requires SETUP with seated UB ADLs, MIN/MOD A with seated LB ADLs, MOD A with standing LB ADLs and MIN A with functional transfers with RW and MIN verbal cues for safe use. Anticipate pt could benefit from continued f/u with skilled OT in acute setting as well as HHOT upon d/c from acute setting to ensure safety with ADLs/ADL mobility in the natural environment.     Follow Up Recommendations  Home health OT;Supervision - Intermittent    Equipment Recommendations  3 in 1 bedside commode;Tub/shower seat    Recommendations for Other Services       Precautions / Restrictions Precautions Precautions: Fall Restrictions Weight Bearing Restrictions: No       Mobility Bed Mobility Overal bed mobility: Modified Independent             General bed mobility comments: increased time, HOB elevated to come to sitting    Transfers Overall transfer level: Needs assistance Equipment used: Rolling walker (2 wheeled) Transfers: Sit to/from [employer] Sit to Stand: Min guard;Min assist Stand pivot transfers: Min guard;Min assist       General transfer comment: cues for safe hand placement    Balance Overall balance assessment: Needs assistance Sitting-balance support: Feet supported Sitting balance-Leahy Scale: Good Sitting balance - Comments: G balance, but poor tolerance for sitting unsupported, often leaning to her L side to rest, but not d/t LOB.     Standing balance-Leahy Scale: Fair Standing balance comment: benefits from UE support at this time d/t low standing tolerance d/t generally feeling weak/poorly.                           ADL either performed or assessed with clinical judgement   ADL Overall ADL's : Needs assistance/impaired                                       General ADL Comments: SETUP with seated UB ADLs, MIN/MOD A with seated LB ADLs, MOD A with standing LB ADLs and MIN A with functional transfers with RW and MIN verbal cues for safe use.     Vision Patient Visual Report: No change from baseline  Perception     Praxis      Pertinent Vitals/Pain Pain Assessment: No/denies pain (vague c/o chronic pain, but nothing new/acute)     Hand Dominance     Extremity/Trunk Assessment Upper Extremity Assessment Upper Extremity Assessment: Generalized weakness (ROM WFL, MMT grossly 4/5)   Lower Extremity Assessment Lower Extremity Assessment: Generalized weakness       Communication Communication Communication: No difficulties   Cognition Arousal/Alertness: Awake/alert Behavior During Therapy: WFL for tasks assessed/performed Overall Cognitive Status:  Within Functional Limits for tasks assessed                                 General Comments: Pt is slightly drowsy, just generally not feeling well and appears sad for most of session, but she is oriented and appropriate with command following.   General Comments  VS stable throughout, only tolerates ~30sec-1 min  stand x2 trials (once for LB bathing, once for transfer/small shuffle steps from bed to chair)    Exercises Other Exercises Other Exercises: OT facilitates ed re: role, importance of OOB activity. Pt with good understanding.   Shoulder Instructions      Home Living Family/patient expects to be discharged to:: Private residence Living Arrangements: Spouse/significant other;Other relatives (grand daughter and 2 great-grand children (boy and girl)) Available Help at Discharge: Family;Available 24 hours/day (states her husband is available 24/7 and in good health) Type of Home: House Home Access: Stairs to enter [employer] of Steps: 4 Entrance Stairs-Rails: Right Home Layout: One level               Home Equipment: Walker - 2 wheels;Walker - 4 wheels   Additional Comments: walkers from previous hip surgery      Prior Functioning/Environment Level of Independence: Independent        Comments: Does not use AD for home or community ambulation, states she still drives, runs errands, and was able to perform all I/ADLs I'ly.        OT Problem List: Decreased strength;Impaired balance (sitting and/or standing);Decreased activity tolerance;Cardiopulmonary status limiting activity;Decreased knowledge of use of DME or AE      OT Treatment/Interventions: Self-care/ADL training;DME and/or AE instruction;Therapeutic activities;Balance training;Energy conservation;Patient/family education;Therapeutic exercise    OT Goals(Current goals can be found in the care plan section) Acute Rehab OT Goals Patient Stated Goal: to get stronger OT Goal  Formulation: With patient Time For Goal Achievement: 12/27/20 Potential to Achieve Goals: Good ADL Goals Pt Will Perform Grooming: with supervision;with min guard assist;standing (wiht LRAD, sink-side for 2-3 g/h tasks to improve fxl standing tolerance) Pt Will Perform Lower Body Dressing: with supervision;sit to/from stand (with LRAD/AE PRN) Pt Will Transfer to Toilet: with supervision;with min guard assist;ambulating (with LRAD to [facility] at least ~10-15' away to increase tolerance for fxl distances.) Pt/caregiver will Perform Home Exercise Program: Increased strength;Both right and left upper extremity;With Supervision  OT Frequency: Min 1X/week   Barriers to D/C:            Co-evaluation              AM-PAC OT "6 Clicks" Daily Activity     Outcome Measure Help from another person eating meals?: None Help from another person taking care of personal grooming?: A Little Help from another person toileting, which includes using toliet, bedpan, or urinal?: A Lot Help from another person bathing (including washing, rinsing, drying)?: A Lot Help from another person to  put on and taking off regular upper body clothing?: A Little Help from another person to put on and taking off regular lower body clothing?: A Lot 6 Click Score: 16   End of Session Equipment Utilized During Treatment: Gait belt;Rolling walker Nurse Communication: Mobility status  Activity Tolerance: Patient tolerated treatment well Patient left: in chair;with call bell/phone within reach;with chair alarm set  OT Visit Diagnosis: Unsteadiness on feet (R26.81);Muscle weakness (generalized) (M62.81)                Time: 4782-9562 OT Time Calculation (min): 47 min Charges:  OT General Charges $OT Visit: 1 Visit OT Evaluation $OT Eval Moderate Complexity: 1 Mod OT Treatments $Self Care/Home Management : 23-37 mins $Therapeutic Activity: 8-22 mins  Rejeana Brock, MS, OTR/L ascom [phone] 12/13/20, 12:09 PM

## 2020-12-14 DIAGNOSIS — K219 Gastro-esophageal reflux disease without esophagitis: Secondary | ICD-10-CM | POA: Diagnosis present

## 2020-12-14 DIAGNOSIS — E860 Dehydration: Secondary | ICD-10-CM | POA: Diagnosis present

## 2020-12-14 DIAGNOSIS — U071 COVID-19: Secondary | ICD-10-CM | POA: Diagnosis present

## 2020-12-14 DIAGNOSIS — R5381 Other malaise: Secondary | ICD-10-CM | POA: Diagnosis present

## 2020-12-14 DIAGNOSIS — Z90710 Acquired absence of both cervix and uterus: Secondary | ICD-10-CM | POA: Diagnosis not present

## 2020-12-14 DIAGNOSIS — R531 Weakness: Secondary | ICD-10-CM | POA: Diagnosis present

## 2020-12-14 DIAGNOSIS — Z79899 Other long term (current) drug therapy: Secondary | ICD-10-CM | POA: Diagnosis not present

## 2020-12-14 DIAGNOSIS — F32A Depression, unspecified: Secondary | ICD-10-CM | POA: Diagnosis present

## 2020-12-14 DIAGNOSIS — E559 Vitamin D deficiency, unspecified: Secondary | ICD-10-CM | POA: Diagnosis present

## 2020-12-14 DIAGNOSIS — M797 Fibromyalgia: Secondary | ICD-10-CM | POA: Diagnosis present

## 2020-12-14 DIAGNOSIS — E785 Hyperlipidemia, unspecified: Secondary | ICD-10-CM | POA: Diagnosis present

## 2020-12-14 DIAGNOSIS — F419 Anxiety disorder, unspecified: Secondary | ICD-10-CM | POA: Diagnosis present

## 2020-12-14 DIAGNOSIS — M545 Low back pain, unspecified: Secondary | ICD-10-CM | POA: Diagnosis present

## 2020-12-14 DIAGNOSIS — J1282 Pneumonia due to coronavirus disease 2019: Secondary | ICD-10-CM | POA: Diagnosis present

## 2020-12-14 DIAGNOSIS — Z23 Encounter for immunization: Secondary | ICD-10-CM | POA: Diagnosis not present

## 2020-12-14 DIAGNOSIS — D72818 Other decreased white blood cell count: Secondary | ICD-10-CM | POA: Diagnosis present

## 2020-12-14 DIAGNOSIS — G894 Chronic pain syndrome: Secondary | ICD-10-CM | POA: Diagnosis present

## 2020-12-14 DIAGNOSIS — I10 Essential (primary) hypertension: Secondary | ICD-10-CM | POA: Diagnosis present

## 2020-12-14 LAB — CBC WITH DIFFERENTIAL/PLATELET
Abs Immature Granulocytes: 0.02 10*3/uL (ref 0.00–0.07)
Basophils Absolute: 0 10*3/uL (ref 0.0–0.1)
Basophils Relative: 0 %
Eosinophils Absolute: 0 10*3/uL (ref 0.0–0.5)
Eosinophils Relative: 0 %
HCT: 35.3 % — ABNORMAL LOW (ref 36.0–46.0)
Hemoglobin: 11.5 g/dL — ABNORMAL LOW (ref 12.0–15.0)
Immature Granulocytes: 1 %
Lymphocytes Relative: 46 %
Lymphs Abs: 1.1 10*3/uL (ref 0.7–4.0)
MCH: 28.5 pg (ref 26.0–34.0)
MCHC: 32.6 g/dL (ref 30.0–36.0)
MCV: 87.4 fL (ref 80.0–100.0)
Monocytes Absolute: 0.3 10*3/uL (ref 0.1–1.0)
Monocytes Relative: 11 %
Neutro Abs: 1 10*3/uL — ABNORMAL LOW (ref 1.7–7.7)
Neutrophils Relative %: 42 %
Platelets: 166 10*3/uL (ref 150–400)
RBC: 4.04 MIL/uL (ref 3.87–5.11)
RDW: 13 % (ref 11.5–15.5)
WBC: 2.4 10*3/uL — ABNORMAL LOW (ref 4.0–10.5)
nRBC: 0 % (ref 0.0–0.2)

## 2020-12-14 LAB — FERRITIN: Ferritin: 198 ng/mL (ref 11–307)

## 2020-12-14 LAB — COMPREHENSIVE METABOLIC PANEL
ALT: 15 U/L (ref 0–44)
AST: 30 U/L (ref 15–41)
Albumin: 3.4 g/dL — ABNORMAL LOW (ref 3.5–5.0)
Alkaline Phosphatase: 60 U/L (ref 38–126)
Anion gap: 10 (ref 5–15)
BUN: 13 mg/dL (ref 8–23)
CO2: 26 mmol/L (ref 22–32)
Calcium: 8.7 mg/dL — ABNORMAL LOW (ref 8.9–10.3)
Chloride: 99 mmol/L (ref 98–111)
Creatinine, Ser: 0.97 mg/dL (ref 0.44–1.00)
GFR, Estimated: >60 mL/min (ref >60)
Glucose, Bld: 101 mg/dL — ABNORMAL HIGH (ref 70–99)
Potassium: 3.8 mmol/L (ref 3.5–5.1)
Sodium: 135 mmol/L (ref 135–145)
Total Bilirubin: 0.6 mg/dL (ref 0.3–1.2)
Total Protein: 7.4 g/dL (ref 6.5–8.1)

## 2020-12-14 LAB — HIV ANTIBODY (ROUTINE TESTING W REFLEX): HIV Screen 4th Generation wRfx: NONREACTIVE

## 2020-12-14 LAB — MAGNESIUM: Magnesium: 1.8 mg/dL (ref 1.7–2.4)

## 2020-12-14 LAB — PHOSPHORUS: Phosphorus: 4.4 mg/dL (ref 2.5–4.6)

## 2020-12-14 LAB — GLUCOSE, CAPILLARY: Glucose-Capillary: 92 mg/dL (ref 70–99)

## 2020-12-14 LAB — C-REACTIVE PROTEIN: CRP: 1.5 mg/dL — ABNORMAL HIGH (ref <1.0)

## 2020-12-14 LAB — FIBRIN DERIVATIVES D-DIMER (ARMC ONLY): Fibrin derivatives D-dimer (ARMC): 1694.67 ng/mL (FEU) — ABNORMAL HIGH (ref 0.00–499.00)

## 2020-12-14 MED ORDER — NYSTATIN 100000 UNIT/GM EX POWD
Freq: Two times a day (BID) | CUTANEOUS | Status: DC
  Filled 2020-12-14: qty 15

## 2020-12-14 NOTE — Evaluation (Signed)
Physical Therapy Evaluation Patient Details Name: Maria Hamilton MRN: [id number] DOB: August 15, 1951 Today's Date: 12/14/2020   History of Present Illness  Pt is a 69 y/o F with PMH including but not limited to depression, chronic back pain status post multiple back surgeries, fibromyalgia, chronic pain syndrome, GERD, headache, hypertension, insomnia, vitamin D deficiency, history of acute bronchitis, history of acute pancreatitis.  She presented to the hospital because progressively worsening cough >1 week. Pt found to be COVID + and CXR suggestive of b/l PNA.    Clinical Impression  Pt required extensive motivation to participate during the session.  Pt education provided on benefits of activity as well as spending time in chair.  Pt eventually and reluctantly agreed to minimal ambulation and impulsively got up, ambulated rapidly to the door, and returning to supine refusing to participate further.  Pt's SpO2 at rest was 99% with HR in the 60's and after ambulation SpO2 was 100% with HR in the 60s, nursing notified.  Will continue to follow patient in hopes of ascertaining more information regarding her activity tolerance and SpO2 response to increased ambulation distances.   Pt will benefit from HHPT services upon discharge to safely address deficits listed in patient problem list for decreased caregiver assistance and eventual return to PLOF.     Follow Up Recommendations Home health PT;Supervision for mobility/OOB    Equipment Recommendations  None recommended by PT    Recommendations for Other Services       Precautions / Restrictions Precautions Precautions: Fall Restrictions Weight Bearing Restrictions: No      Mobility  Bed Mobility Overal bed mobility: Independent             General bed mobility comments: Good speed and effort with bed mobility tasks    Transfers Overall transfer level: Needs assistance Equipment used: None Transfers: Sit to/from Stand Sit to  Stand: Supervision         General transfer comment: Good eccentric and concentric control  Ambulation/Gait Ambulation/Gait assistance: Supervision Gait Distance (Feet): 10 Feet Assistive device: None Gait Pattern/deviations: Step-through pattern;WFL(Within Functional Limits) Gait velocity: WFL   General Gait Details: Good cadence and stability but with pt declining to attempt to amb further that 10 feet this session  Stairs            Wheelchair Mobility    Modified Rankin (Stroke Patients Only)       Balance Overall balance assessment: Needs assistance   Sitting balance-Leahy Scale: Good     Standing balance support: No upper extremity supported;During functional activity Standing balance-Leahy Scale: Good                               Pertinent Vitals/Pain Pain Assessment: No/denies pain    Home Living Family/patient expects to be discharged to:: Private residence Living Arrangements: Spouse/significant other;Other relatives Available Help at Discharge: Family;Available 24 hours/day Type of Home: House Home Access: Stairs to enter Entrance Stairs-Rails: Right Entrance Stairs-Number of Steps: 4 Home Layout: One level Home Equipment: Walker - 2 wheels;Walker - 4 wheels Additional Comments: walkers from previous hip surgery    Prior Function Level of Independence: Independent         Comments: Does not use AD for home or community ambulation, states she still drives, runs errands, and was able to perform all I/ADLs I'ly.     Hand Dominance        Extremity/Trunk Assessment   Upper Extremity  Assessment Upper Extremity Assessment: Generalized weakness    Lower Extremity Assessment Lower Extremity Assessment: Generalized weakness       Communication   Communication: No difficulties  Cognition Arousal/Alertness: Awake/alert Behavior During Therapy: Impulsive Overall Cognitive Status: No family/caregiver present to determine  baseline cognitive functioning                                        General Comments      Exercises Other Exercises Other Exercises: Extensive education provided on physiological benefits of activity and importance of being up in chair   Assessment/Plan    PT Assessment Patient needs continued PT services  PT Problem List Decreased strength;Decreased activity tolerance;Decreased balance;Decreased mobility;Decreased safety awareness       PT Treatment Interventions DME instruction;Gait training;Stair training;Functional mobility training;Therapeutic activities;Therapeutic exercise;Balance training;Patient/family education    PT Goals (Current goals can be found in the Care Plan section)  Acute Rehab PT Goals Patient Stated Goal: to get stronger PT Goal Formulation: With patient Time For Goal Achievement: 12/27/20 Potential to Achieve Goals: Good    Frequency Min 2X/week   Barriers to discharge        Co-evaluation               AM-PAC PT "6 Clicks" Mobility  Outcome Measure Help needed turning from your back to your side while in a flat bed without using bedrails?: None Help needed moving from lying on your back to sitting on the side of a flat bed without using bedrails?: None Help needed moving to and from a bed to a chair (including a wheelchair)?: A Little Help needed standing up from a chair using your arms (e.g., wheelchair or bedside chair)?: A Little Help needed to walk in hospital room?: A Little Help needed climbing 3-5 steps with a railing? : A Little 6 Click Score: 20    End of Session   Activity Tolerance: Patient tolerated treatment well Patient left: in bed;with call bell/phone within reach;with bed alarm set Nurse Communication: Mobility status PT Visit Diagnosis: Muscle weakness (generalized) (M62.81);Difficulty in walking, not elsewhere classified (R26.2)    Time: 0109-3235 PT Time Calculation (min) (ACUTE ONLY): 40  min   Charges:   PT Evaluation $PT Eval Moderate Complexity: 1 Mod PT Treatments $Therapeutic Activity: 23-37 mins        D. Scott Terrica Duecker PT, DPT 12/14/20, 3:15 PM

## 2020-12-14 NOTE — Progress Notes (Signed)
PROGRESS NOTE    Maria Hamilton  ZOX:[id number] DOB: 1951/06/05 DOA: 12/12/2020 PCP: Glori Luis, MD    Brief Narrative:  Maria Hamilton is a 69 year old female with past medical history significant for depression, chronic back pain/pain syndrome s/p multiple surgical interventions, GERD, essential hypertension, insomnia, vitamin D deficiency, who presented to the ED with progressive cough, shortness of breath, weakness, fatigue and unsteady gait.  Patient unvaccinated against Covid-19.  In the ED, temperature 100.2.  Covid-19 PCR positive.  Influenza a/B PCR negative.  Sodium 134, potassium 3.9, chloride 99, CO2 25, glucose 94, BUN 12, creatinine 0.87.  WBC 4.3, hemoglobin 13.0, platelets 184.  Troponin IX.  CRP 1.2.  Fibrin derivatives 1667.I Chest x-ray with patchy bibasilar infiltrates consistent with viral pneumonia.  Ambulation was attempted by nursing staff in the ED in which she lost her balance multiple times and almost fell.  Hospitalist service was consulted for admission and further evaluation and management of acute viral infection with associated weakness/debility.   Assessment & Plan:   Principal Problem:   Pneumonia due to COVID-19 virus Active Problems:   Generalized weakness   Unsteady gait   COVID-19 virus infection   Acute Covid-19 viral infection during the ongoing 2020/2021 Covid 19 Pandemic - POA Patient presenting with progressive weakness, cough and shortness of breath.  Chest x-ray with findings consistent with multifocal viral pneumonia.  Covid-19 PCR positive. --COVID test: + PCR 12/12/2020 --CRP 1.2>1.4>1.5 --ddimer [id number] --Remdesivir, plan 5-day course (Day #3/5) --Continue monitor SPO2 closely, currently oxygenating well on room air --Continue supportive care with albuterol MDI prn, vitamin C, zinc, Tylenol, antitussives (benzonatate/ Mucinex/Tussionex) --Follow CBC, CMP, D-dimer, and CRP daily --Continue airborne/contact isolation  precautions for 3 weeks from the day of diagnosis inpatient (01/03/2021)  The treatment plan and use of medications and known side effects were discussed with patient/family. Some of the medications used are based on case reports/anecdotal data.  All other medications being used in the management of COVID-19 based on limited study data.  Complete risks and long-term side effects are unknown, however in the best clinical judgment they seem to be of some benefit.  Patient wanted to proceed with treatment options provided.  Hyperlipidemia --Crestor 20 mg p.o. daily  Depression/anxiety --Lexapro 10 mg p.o. daily  Essential hypertension --Coreg 12.5 mg p.o. twice daily  Vitamin D deficiency --Coley Calciferol 1000 units p.o. daily  Chronic pain syndrome/low back pain --Zanaflex 4 mg p.o. nightly as needed --Gabapentin 100 mg p.o. twice daily  Weakness/debility/deconditioning: Seen by OT with recommendations of home health OT, 3:1 bedside commode, tub/shower seat --Pending PT evaluation   DVT prophylaxis: Lovenox Code Status: Full code Family Communication: Updated patient spouse, Jillyn Hidden via telephone this afternoon  Disposition Plan:  Status is: Inpatient  Remains inpatient appropriate because:Unsafe d/c plan, IV treatments appropriate due to intensity of illness or inability to take PO and Inpatient level of care appropriate due to severity of illness   Dispo: The patient is from: Home              Anticipated d/c is to: Home              Anticipated d/c date is: 2 days              Patient currently is not medically stable to d/c.   Consultants:   None  Procedures:   None  Antimicrobials:   None   Subjective: Patient seen and examined at bedside, resting comfortably.  States that shortness  of breath improving slowly daily.  Nursing reports patient with poor motivation for mobilization.  No other questions or concerns at this time.  Denies headache, no chest pain, no  palpitations, no abdominal pain, no fever/chills/night sweats.  No other concerns overnight per nursing staff.  Objective: Vitals:   12/13/20 2330 12/14/20 0616 12/14/20 0841 12/14/20 1150  BP: 96/62 96/62 102/68 116/76  Pulse: 66 (!) 55 (!) 55 (!) 58  Resp: 16 20 18 18   Temp: 99.2 F (37.3 C) 98.3 F (36.8 C) 98.3 F (36.8 C) 97.8 F (36.6 C)  TempSrc: Oral  Oral Oral  SpO2: 95% 100% 97% 99%  Weight:      Height:       No intake or output data in the 24 hours ending 12/14/20 1339 Filed Weights   12/12/20 0811  Weight: 91.2 kg    Examination:  General exam: Appears calm and comfortable  Respiratory system: Clear to auscultation. Respiratory effort normal.  Oxygenating well on room air Cardiovascular system: S1 & S2 heard, RRR. No JVD, murmurs, rubs, gallops or clicks. No pedal edema. Gastrointestinal system: Abdomen is nondistended, soft and nontender. No organomegaly or masses felt. Normal bowel sounds heard.  Central nervous system: Alert and oriented. No focal neurological deficits. Extremities: Symmetric 5 x 5 power. Skin: No rashes, lesions or ulcers Psychiatry: Judgement and insight appear poor. Mood & affect appropriate.     Data Reviewed: I have personally reviewed following labs and imaging studies  CBC: Recent Labs  Lab 12/12/20 1055 12/13/20 0606 12/14/20 0448  WBC 4.3 2.4* 2.4*  NEUTROABS  --  1.4* 1.0*  HGB 13.0 12.4 11.5*  HCT 40.1 37.0 35.3*  MCV 87.6 85.6 87.4  PLT 184 159 166   Basic Metabolic Panel: Recent Labs  Lab 12/12/20 1055 12/13/20 0606 12/14/20 0448  NA 134* 137 135  K 3.9 3.9 3.8  CL 99 104 99  CO2 25 25 26   GLUCOSE 94 106* 101*  BUN 12 11 13   CREATININE 0.87 0.71 0.97  CALCIUM 8.7* 8.6* 8.7*  MG  --  1.9 1.8  PHOS  --  2.5 4.4   GFR: Estimated Creatinine Clearance: 69.4 mL/min (by C-G formula based on SCr of 0.97 mg/dL). Liver Function Tests: Recent Labs  Lab 12/12/20 1055 12/13/20 0606 12/14/20 0448  AST 34 31  30  ALT 16 16 15   ALKPHOS 70 66 60  BILITOT 0.4 0.4 0.6  PROT 8.1 7.4 7.4  ALBUMIN 3.8 3.4* 3.4*   No results for input(s): LIPASE, AMYLASE in the last 168 hours. No results for input(s): AMMONIA in the last 168 hours. Coagulation Profile: No results for input(s): INR, PROTIME in the last 168 hours. Cardiac Enzymes: No results for input(s): CKTOTAL, CKMB, CKMBINDEX, TROPONINI in the last 168 hours. BNP (last 3 results) No results for input(s): PROBNP in the last 8760 hours. HbA1C: No results for input(s): HGBA1C in the last 72 hours. CBG: No results for input(s): GLUCAP in the last 168 hours. Lipid Profile: No results for input(s): CHOL, HDL, LDLCALC, TRIG, CHOLHDL, LDLDIRECT in the last 72 hours. Thyroid Function Tests: No results for input(s): TSH, T4TOTAL, FREET4, T3FREE, THYROIDAB in the last 72 hours. Anemia Panel: Recent Labs    12/13/20 0606 12/14/20 0448  FERRITIN 163 198   Sepsis Labs: No results for input(s): PROCALCITON, LATICACIDVEN in the last 168 hours.  Recent Results (from the past 240 hour(s))  Resp Panel by RT-PCR (Flu A&B, Covid) Nasopharyngeal Swab  Status: Abnormal   Collection Time: 12/12/20  8:13 AM   Specimen: Nasopharyngeal Swab; Nasopharyngeal(NP) swabs in vial transport medium  Result Value Ref Range Status   SARS Coronavirus 2 by RT PCR POSITIVE (A) NEGATIVE Final    Comment: RESULT CALLED TO, READ BACK BY AND VERIFIED WITH: LORRIE LEMONS AT 4098 ON 12/12/2020 [facility]. (NOTE) SARS-CoV-2 target nucleic acids are DETECTED.  The SARS-CoV-2 RNA is generally detectable in upper respiratory specimens during the acute phase of infection. Positive results are indicative of the presence of the identified virus, but do not rule out bacterial infection or co-infection with other pathogens not detected by the test. Clinical correlation with patient history and other diagnostic information is necessary to determine patient infection status. The expected  result is Negative.  Fact Sheet for Patients: BloggerCourse.com  Fact Sheet for Healthcare Providers: SeriousBroker.it  This test is not yet approved or cleared by the [place] FDA and  has been authorized for detection and/or diagnosis of SARS-CoV-2 by FDA under an Emergency Use Authorization (EUA).  This EUA will remain in effect (meaning this test  can be used) for the duration of  the COVID-19 declaration under Section 564(b)(1) of the Act, 21 U.S.C. section 360bbb-3(b)(1), unless the authorization is terminated or revoked sooner.     Influenza A by PCR NEGATIVE NEGATIVE Final   Influenza B by PCR NEGATIVE NEGATIVE Final    Comment: (NOTE) The Xpert Xpress SARS-CoV-2/FLU/RSV plus assay is intended as an aid in the diagnosis of influenza from Nasopharyngeal swab specimens and should not be used as a sole basis for treatment. Nasal washings and aspirates are unacceptable for Xpert Xpress SARS-CoV-2/FLU/RSV testing.  Fact Sheet for Patients: BloggerCourse.com  Fact Sheet for Healthcare Providers: SeriousBroker.it  This test is not yet approved or cleared by the [place] FDA and has been authorized for detection and/or diagnosis of SARS-CoV-2 by FDA under an Emergency Use Authorization (EUA). This EUA will remain in effect (meaning this test can be used) for the duration of the COVID-19 declaration under Section 564(b)(1) of the Act, 21 U.S.C. section 360bbb-3(b)(1), unless the authorization is terminated or revoked.  Performed at [facility], [address] [place], [place] [address]          Radiology Studies: No results found.      Scheduled Meds: . calcium carbonate  500 mg of elemental calcium Oral Q breakfast  . carvedilol  12.5 mg Oral BID WC  . cholecalciferol  1,000 Units Oral Daily  . enoxaparin (LOVENOX) injection  40 mg  Subcutaneous Q24H  . escitalopram  10 mg Oral Daily  . feeding supplement  237 mL Oral BID BM  . gabapentin  100 mg Oral BID  . influenza vaccine adjuvanted  0.5 mL Intramuscular Tomorrow-1000  . melatonin  5 mg Oral QHS  . multivitamin with minerals  1 tablet Oral Daily  . nystatin   Topical BID  . rosuvastatin  20 mg Oral Daily   Continuous Infusions: . remdesivir 100 mg in NS 100 mL 100 mg (12/14/20 0855)     LOS: 0 days    Time spent: 39 minutes spent on chart review, discussion with nursing staff, consultants, updating family and interview/physical exam; more than 50% of that time was spent in counseling and/or coordination of care.    Alvira Philips [place], DO [facility] Available via Epic secure chat 7am-7pm After these hours, please refer to coverage provider listed on [email or web address] 12/14/2020, 1:39 PM

## 2020-12-15 DIAGNOSIS — U071 COVID-19: Secondary | ICD-10-CM | POA: Diagnosis not present

## 2020-12-15 DIAGNOSIS — J1282 Pneumonia due to coronavirus disease 2019: Secondary | ICD-10-CM | POA: Diagnosis not present

## 2020-12-15 LAB — GLUCOSE, CAPILLARY: Glucose-Capillary: 94 mg/dL (ref 70–99)

## 2020-12-15 LAB — CBC WITH DIFFERENTIAL/PLATELET
Abs Immature Granulocytes: 0.02 10*3/uL (ref 0.00–0.07)
Basophils Absolute: 0 10*3/uL (ref 0.0–0.1)
Basophils Relative: 0 %
Eosinophils Absolute: 0 10*3/uL (ref 0.0–0.5)
Eosinophils Relative: 0 %
HCT: 34.6 % — ABNORMAL LOW (ref 36.0–46.0)
Hemoglobin: 11.5 g/dL — ABNORMAL LOW (ref 12.0–15.0)
Immature Granulocytes: 1 %
Lymphocytes Relative: 49 %
Lymphs Abs: 1.2 10*3/uL (ref 0.7–4.0)
MCH: 28.5 pg (ref 26.0–34.0)
MCHC: 33.2 g/dL (ref 30.0–36.0)
MCV: 85.6 fL (ref 80.0–100.0)
Monocytes Absolute: 0.3 10*3/uL (ref 0.1–1.0)
Monocytes Relative: 11 %
Neutro Abs: 1 10*3/uL — ABNORMAL LOW (ref 1.7–7.7)
Neutrophils Relative %: 39 %
Platelets: 176 10*3/uL (ref 150–400)
RBC: 4.04 MIL/uL (ref 3.87–5.11)
RDW: 13 % (ref 11.5–15.5)
Smear Review: NORMAL
WBC: 2.4 10*3/uL — ABNORMAL LOW (ref 4.0–10.5)
nRBC: 0 % (ref 0.0–0.2)

## 2020-12-15 LAB — COMPREHENSIVE METABOLIC PANEL
ALT: 16 U/L (ref 0–44)
AST: 32 U/L (ref 15–41)
Albumin: 3.2 g/dL — ABNORMAL LOW (ref 3.5–5.0)
Alkaline Phosphatase: 55 U/L (ref 38–126)
Anion gap: 11 (ref 5–15)
BUN: 16 mg/dL (ref 8–23)
CO2: 23 mmol/L (ref 22–32)
Calcium: 8.5 mg/dL — ABNORMAL LOW (ref 8.9–10.3)
Chloride: 102 mmol/L (ref 98–111)
Creatinine, Ser: 0.78 mg/dL (ref 0.44–1.00)
GFR, Estimated: >60 mL/min (ref >60)
Glucose, Bld: 117 mg/dL — ABNORMAL HIGH (ref 70–99)
Potassium: 3.4 mmol/L — ABNORMAL LOW (ref 3.5–5.1)
Sodium: 136 mmol/L (ref 135–145)
Total Bilirubin: 0.6 mg/dL (ref 0.3–1.2)
Total Protein: 7 g/dL (ref 6.5–8.1)

## 2020-12-15 LAB — FIBRIN DERIVATIVES D-DIMER (ARMC ONLY): Fibrin derivatives D-dimer (ARMC): 2082.26 ng/mL (FEU) — ABNORMAL HIGH (ref 0.00–499.00)

## 2020-12-15 LAB — C-REACTIVE PROTEIN: CRP: 1.4 mg/dL — ABNORMAL HIGH (ref <1.0)

## 2020-12-15 MED ORDER — POTASSIUM CHLORIDE CRYS ER 20 MEQ PO TBCR
40.0000 meq | EXTENDED_RELEASE_TABLET | Freq: Once | ORAL | Status: AC
  Administered 2020-12-15: 08:00:00 40 meq via ORAL
  Filled 2020-12-15: qty 2

## 2020-12-15 NOTE — Progress Notes (Signed)
PROGRESS NOTE    Maria Hamilton  ZOX:[id number] DOB: Nov 07, 1951 DOA: 12/12/2020 PCP: Glori Luis, MD    Brief Narrative:  Maria Hamilton is a 69 year old female with past medical history significant for depression, chronic back pain/pain syndrome s/p multiple surgical interventions, GERD, essential hypertension, insomnia, vitamin D deficiency, who presented to the ED with progressive cough, shortness of breath, weakness, fatigue and unsteady gait.  Patient unvaccinated against Covid-19.  In the ED, temperature 100.2.  Covid-19 PCR positive.  Influenza a/B PCR negative.  Sodium 134, potassium 3.9, chloride 99, CO2 25, glucose 94, BUN 12, creatinine 0.87.  WBC 4.3, hemoglobin 13.0, platelets 184.  Troponin 9.  CRP 1.2.  Fibrin derivatives 1667.I Chest x-ray with patchy bibasilar infiltrates consistent with viral pneumonia.  Ambulation was attempted by nursing staff in the ED in which she lost her balance multiple times and almost fell.  Hospitalist service was consulted for admission and further evaluation and management of acute viral infection with associated weakness/debility.   Assessment & Plan:   Principal Problem:   Pneumonia due to COVID-19 virus Active Problems:   Generalized weakness   Unsteady gait   COVID-19 virus infection   Acute Covid-19 viral infection during the ongoing 2020/2021 Covid 19 Pandemic - POA Patient presenting with progressive weakness, cough and shortness of breath.  Chest x-ray with findings consistent with multifocal viral pneumonia.  Covid-19 PCR positive. --COVID test: + PCR 12/12/2020 --CRP 1.2>1.4>1.5>1.4 --ddimer [id number] --Remdesivir, plan 5-day course (Day #4/5) --Continue monitor SPO2 closely, currently oxygenating well on room air --Continue supportive care with albuterol MDI prn, vitamin C, zinc, Tylenol, antitussives (benzonatate/ Mucinex/Tussionex) --Follow CBC, CMP, D-dimer, and CRP daily --Continue airborne/contact  isolation precautions for 10 days from the day of diagnosis inpatient (12/22/2020)  The treatment plan and use of medications and known side effects were discussed with patient/family. Some of the medications used are based on case reports/anecdotal data.  All other medications being used in the management of COVID-19 based on limited study data.  Complete risks and long-term side effects are unknown, however in the best clinical judgment they seem to be of some benefit.  Patient wanted to proceed with treatment options provided.  Hyperlipidemia --Crestor 20 mg p.o. daily  Depression/anxiety --Lexapro 10 mg p.o. daily  Essential hypertension --Coreg 12.5 mg p.o. twice daily  Vitamin D deficiency --Coley Calciferol 1000 units p.o. daily  Chronic pain syndrome/low back pain --Zanaflex 4 mg p.o. nightly as needed --Gabapentin 100 mg p.o. twice daily  Weakness/debility/deconditioning: Seen by OT with recommendations of home health OT, 3:1 bedside commode, tub/shower seat --Pending PT evaluation   DVT prophylaxis: Lovenox Code Status: Full code Family Communication: Updated patient spouse, Jillyn Hidden via telephone this afternoon  Disposition Plan:  Status is: Inpatient  Remains inpatient appropriate because:Unsafe d/c plan, IV treatments appropriate due to intensity of illness or inability to take PO and Inpatient level of care appropriate due to severity of illness   Dispo: The patient is from: Home              Anticipated d/c is to: Home              Anticipated d/c date is: 1 day              Patient currently is not medically stable to d/c.   Consultants:   None  Procedures:   None  Antimicrobials:   None   Subjective: Patient seen and examined at bedside, resting comfortably.  No specific complaints  this morning.  Nursing reports intermittent refusal of treatment/therapy. Denies headache, no chest pain, no palpitations, no abdominal pain, no fever/chills/night sweats.   No other concerns overnight per nursing staff.  Objective: Vitals:   12/14/20 2108 12/15/20 0047 12/15/20 0404 12/15/20 0720  BP: 112/66 (!) 101/53 95/64 123/77  Pulse: 65 (!) 56 (!) 56 (!) 56  Resp: 18 18 18 18   Temp: 98.5 F (36.9 C) 98.4 F (36.9 C) 97.9 F (36.6 C) 98 F (36.7 C)  TempSrc: Oral Oral Oral   SpO2: 95% 98% 97% 99%  Weight:      Height:        Intake/Output Summary (Last 24 hours) at 12/15/2020 1204 Last data filed at 12/14/2020 1657 Gross per 24 hour  Intake 0 ml  Output 800 ml  Net -800 ml   Filed Weights   12/12/20 0811  Weight: 91.2 kg    Examination:  General exam: Appears calm and comfortable  Respiratory system: Clear to auscultation. Respiratory effort normal.  Oxygenating well on room air Cardiovascular system: S1 & S2 heard, RRR. No JVD, murmurs, rubs, gallops or clicks. No pedal edema. Gastrointestinal system: Abdomen is nondistended, soft and nontender. No organomegaly or masses felt. Normal bowel sounds heard.  Central nervous system: Alert and oriented. No focal neurological deficits. Extremities: Symmetric 5 x 5 power. Skin: No rashes, lesions or ulcers Psychiatry: Judgement and insight appear poor. Mood & affect appropriate.     Data Reviewed: I have personally reviewed following labs and imaging studies  CBC: Recent Labs  Lab 12/12/20 1055 12/13/20 0606 12/14/20 0448 12/15/20 0411  WBC 4.3 2.4* 2.4* 2.4*  NEUTROABS  --  1.4* 1.0* 1.0*  HGB 13.0 12.4 11.5* 11.5*  HCT 40.1 37.0 35.3* 34.6*  MCV 87.6 85.6 87.4 85.6  PLT 184 159 166 176   Basic Metabolic Panel: Recent Labs  Lab 12/12/20 1055 12/13/20 0606 12/14/20 0448 12/15/20 0411  NA 134* 137 135 136  K 3.9 3.9 3.8 3.4*  CL 99 104 99 102  CO2 25 25 26 23   GLUCOSE 94 106* 101* 117*  BUN 12 11 13 16   CREATININE 0.87 0.71 0.97 0.78  CALCIUM 8.7* 8.6* 8.7* 8.5*  MG  --  1.9 1.8  --   PHOS  --  2.5 4.4  --    GFR: Estimated Creatinine Clearance: 84.1 mL/min (by  C-G formula based on SCr of 0.78 mg/dL). Liver Function Tests: Recent Labs  Lab 12/12/20 1055 12/13/20 0606 12/14/20 0448 12/15/20 0411  AST 34 31 30 32  ALT 16 16 15 16   ALKPHOS 70 66 60 55  BILITOT 0.4 0.4 0.6 0.6  PROT 8.1 7.4 7.4 7.0  ALBUMIN 3.8 3.4* 3.4* 3.2*   No results for input(s): LIPASE, AMYLASE in the last 168 hours. No results for input(s): AMMONIA in the last 168 hours. Coagulation Profile: No results for input(s): INR, PROTIME in the last 168 hours. Cardiac Enzymes: No results for input(s): CKTOTAL, CKMB, CKMBINDEX, TROPONINI in the last 168 hours. BNP (last 3 results) No results for input(s): PROBNP in the last 8760 hours. HbA1C: No results for input(s): HGBA1C in the last 72 hours. CBG: Recent Labs  Lab 12/14/20 1645  GLUCAP 92   Lipid Profile: No results for input(s): CHOL, HDL, LDLCALC, TRIG, CHOLHDL, LDLDIRECT in the last 72 hours. Thyroid Function Tests: No results for input(s): TSH, T4TOTAL, FREET4, T3FREE, THYROIDAB in the last 72 hours. Anemia Panel: Recent Labs    12/13/20 0606 12/14/20 0448  FERRITIN 163 198   Sepsis Labs: No results for input(s): PROCALCITON, LATICACIDVEN in the last 168 hours.  Recent Results (from the past 240 hour(s))  Resp Panel by RT-PCR (Flu A&B, Covid) Nasopharyngeal Swab     Status: Abnormal   Collection Time: 12/12/20  8:13 AM   Specimen: Nasopharyngeal Swab; Nasopharyngeal(NP) swabs in vial transport medium  Result Value Ref Range Status   SARS Coronavirus 2 by RT PCR POSITIVE (A) NEGATIVE Final    Comment: RESULT CALLED TO, READ BACK BY AND VERIFIED WITH: LORRIE LEMONS AT 8413 ON 12/12/2020 [facility]. (NOTE) SARS-CoV-2 target nucleic acids are DETECTED.  The SARS-CoV-2 RNA is generally detectable in upper respiratory specimens during the acute phase of infection. Positive results are indicative of the presence of the identified virus, but do not rule out bacterial infection or co-infection with other  pathogens not detected by the test. Clinical correlation with patient history and other diagnostic information is necessary to determine patient infection status. The expected result is Negative.  Fact Sheet for Patients: BloggerCourse.com  Fact Sheet for Healthcare Providers: SeriousBroker.it  This test is not yet approved or cleared by the [place] FDA and  has been authorized for detection and/or diagnosis of SARS-CoV-2 by FDA under an Emergency Use Authorization (EUA).  This EUA will remain in effect (meaning this test  can be used) for the duration of  the COVID-19 declaration under Section 564(b)(1) of the Act, 21 U.S.C. section 360bbb-3(b)(1), unless the authorization is terminated or revoked sooner.     Influenza A by PCR NEGATIVE NEGATIVE Final   Influenza B by PCR NEGATIVE NEGATIVE Final    Comment: (NOTE) The Xpert Xpress SARS-CoV-2/FLU/RSV plus assay is intended as an aid in the diagnosis of influenza from Nasopharyngeal swab specimens and should not be used as a sole basis for treatment. Nasal washings and aspirates are unacceptable for Xpert Xpress SARS-CoV-2/FLU/RSV testing.  Fact Sheet for Patients: BloggerCourse.com  Fact Sheet for Healthcare Providers: SeriousBroker.it  This test is not yet approved or cleared by the [place] FDA and has been authorized for detection and/or diagnosis of SARS-CoV-2 by FDA under an Emergency Use Authorization (EUA). This EUA will remain in effect (meaning this test can be used) for the duration of the COVID-19 declaration under Section 564(b)(1) of the Act, 21 U.S.C. section 360bbb-3(b)(1), unless the authorization is terminated or revoked.  Performed at [facility], [address], [place], [place] [address]          Radiology Studies: No results found.      Scheduled Meds: . calcium  carbonate  500 mg of elemental calcium Oral Q breakfast  . carvedilol  12.5 mg Oral BID WC  . cholecalciferol  1,000 Units Oral Daily  . enoxaparin (LOVENOX) injection  40 mg Subcutaneous Q24H  . escitalopram  10 mg Oral Daily  . feeding supplement  237 mL Oral BID BM  . gabapentin  100 mg Oral BID  . influenza vaccine adjuvanted  0.5 mL Intramuscular Tomorrow-1000  . melatonin  5 mg Oral QHS  . multivitamin with minerals  1 tablet Oral Daily  . nystatin   Topical BID  . rosuvastatin  20 mg Oral Daily   Continuous Infusions: . remdesivir 100 mg in NS 100 mL 100 mg (12/15/20 0743)     LOS: 1 day    Time spent: 36 minutes spent on chart review, discussion with nursing staff, consultants, updating family and interview/physical exam; more than 50% of that time  was spent in counseling and/or coordination of care.    Alvira Philips [place], DO [facility] Available via Epic secure chat 7am-7pm After these hours, please refer to coverage provider listed on [email or web address] 12/15/2020, 12:04 PM

## 2020-12-15 NOTE — TOC Progression Note (Signed)
Transition of Care [facility]) - Progression Note    Patient Details  Name: Maria Hamilton MRN: [id number] Date of Birth: 05/27/1951  Transition of Care [facility]) CM/SW Contact  Allayne Butcher, RN Phone Number: 12/15/2020, 12:54 PM  Clinical Narrative:     RNCM was able to speak with patient via phone and she agrees to home health services at discharge.  [facility] chooses [facility].  Cindy with [facility] given referral for RN, PT, and OT.  3 in 1 ordered from [employer] and to be delivered to the room.   Expected Discharge Plan: Home w Home Health Services Barriers to Discharge: Continued Medical Work up  Expected Discharge Plan and Services Expected Discharge Plan: Home w Home Health Services   Discharge Planning Services: CM Consult Post Acute Care Choice: Home Health Living arrangements for the past 2 months: Single Family Home                 DME Arranged: 3-N-1 DME Agency: [facility] Date DME Agency Contacted: 12/15/20 Time DME Agency Contacted: 1254 Representative spoke with at DME Agency: Ian Malkin HH Arranged: RN,PT,OT HH Agency: [facility] Date [facility] Agency Contacted: 12/15/20 Time HH Agency Contacted: 1254 Representative spoke with at [facility] Agency: Arline Asp   Social Determinants of Health (SDOH) Interventions    Readmission Risk Interventions No flowsheet data found.

## 2020-12-15 NOTE — TOC Initial Note (Signed)
Transition of Care [facility]) - Initial/Assessment Note    Patient Details  Name: Maria Hamilton MRN: [id number] Date of Birth: [birth year]  Transition of Care [facility]) CM/SW Contact:    Allayne Butcher, RN Phone Number: 12/15/2020, 9:26 AM  Clinical Narrative:                 Patient admitted to the hospital with COVID not requiring any supplemental oxygen at this time.  RNCM attempted to call patient at the bedside but phone is busy.  RNCM was able to speak with the patient's husband, Jillyn Hidden via phone.  Patient is from home where she lives with her husband and granddaughter and 2 great grandchildren.  Jillyn Hidden reports that the patient is normally independent in ADL's and drives.   Potential discharge planned for tomorrow.  RNCM will try to reach back out to patient to see if she is okay with home health services being arranged.    Expected Discharge Plan: Home w Home Health Services Barriers to Discharge: Continued Medical Work up   Patient Goals and CMS Choice   CMS Medicare.gov Compare Post Acute Care list provided to:: Patient Choice offered to / list presented to : Patient  Expected Discharge Plan and Services Expected Discharge Plan: Home w Home Health Services   Discharge Planning Services: CM Consult Post Acute Care Choice: Home Health Living arrangements for the past 2 months: Single Family Home                                      Prior Living Arrangements/Services Living arrangements for the past 2 months: Single Family Home Lives with:: Spouse,Relatives Patient language and need for interpreter reviewed:: Yes Do you feel safe going back to the place where you live?: Yes      Need for Family Participation in Patient Care: Yes (Comment) (COVID) Care giver support system in place?: Yes (comment) (husband)   Criminal Activity/Legal Involvement Pertinent to Current Situation/Hospitalization: No - Comment as needed  Activities of Daily Living Home Assistive  Devices/Equipment: Cane (specify quad or straight),Walker (specify type) ADL Screening (condition at time of admission) Patient's cognitive ability adequate to safely complete daily activities?: Yes Is the patient deaf or have difficulty hearing?: No Does the patient have difficulty seeing, even when wearing glasses/contacts?: No Does the patient have difficulty concentrating, remembering, or making decisions?: No Patient able to express need for assistance with ADLs?: Yes Does the patient have difficulty dressing or bathing?: No Independently performs ADLs?: Yes (appropriate for developmental age) Does the patient have difficulty walking or climbing stairs?: No Weakness of Legs: Both Weakness of Arms/Hands: Both  Permission Sought/Granted Permission sought to share information with : Case Manager,Family Supports Permission granted to share information with : Yes, Verbal Permission Granted  Share Information with NAME: Jillyn Hidden     Permission granted to share info w Relationship: husband     Emotional Assessment       Orientation: : Oriented to Self,Oriented to Place,Oriented to Situation,Oriented to  Time Alcohol / Substance Use: Not Applicable Psych Involvement: No (comment)  Admission diagnosis:  Weakness [R53.1] Pneumonia due to COVID-19 virus [U07.1, J12.82] COVID-19 virus infection [U07.1] Patient Active Problem List   Diagnosis Date Noted  . COVID-19 virus infection 12/14/2020  . Pneumonia due to COVID-19 virus 12/12/2020  . Generalized weakness 12/12/2020  . Unsteady gait 12/12/2020  . Falls 05/02/2020  . Muscle cramps 05/02/2020  .  Hot flashes 01/29/2020  . Leg swelling 01/29/2020  . Left foot pain 01/29/2020  . Left shoulder pain 01/29/2020  . Leg edema 01/12/2020  . Toe swelling 08/07/2019  . Numbness and tingling of foot 11/19/2018  . Hot flashes due to menopause 11/19/2018  . Spondylosis without myelopathy or radiculopathy, lumbosacral region 04/28/2018  .  Chronic hip pain after total replacement of hip joint (Left) 04/28/2018  . Chronic hip pain after total replacement of hip joint (Right) 04/28/2018  . Chronic pain of hip  (Bilateral) (L>R) 04/28/2018  . Chronic sacroiliac joint pain (Bilateral) (L>R) 04/28/2018  . Lumbar facet syndrome (Bilateral) (L>R) 04/28/2018  . Vitamin B 12 deficiency 04/02/2018  . Pain of both shoulder joints 04/02/2018  . Degeneration of lumbar intervertebral disc 10/30/2017  . Osteoarthritis of hip 10/30/2017  . History of allergy to radiographic contrast media 10/29/2017  . Ankle pain 10/16/2017  . Anxiety and depression 10/16/2017  . Viral URI with cough 10/16/2017  . RLS (restless legs syndrome) 10/10/2017  . Night sweats 08/13/2017  . Chronic foot numbness (Left) 04/02/2017  . Vertigo 03/25/2017  . 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) 03/20/2017  . History of lumbar fusion (L3-4, L4-5, and L5-S1) 03/20/2017  . Post herpetic neuralgia 03/12/2017  . Abnormal MRI, lumbar spine (03/06/2017) 03/12/2017  . Memory difficulty 03/06/2017  . Polyneuropathy 03/06/2017  . Palpitations 11/26/2016  . Epigastric pain 11/26/2016  . Therapeutic opioid-induced constipation (OIC) 11/20/2016  . Constipation   . Hypokalemia 09/27/2016  . Long term current use of opiate analgesic 09/24/2016  . Long term prescription opiate use 09/24/2016  . Opiate use 09/24/2016  . Encounter for therapeutic drug level monitoring 09/24/2016  . Encounter for pain management planning 09/24/2016  . Rheumatoid arthritis, multiple sites (positive RF) 09/24/2016  . Chronic lower extremity pain (Secondary source of pain) (Left) 09/24/2016  . Lumbar facet arthropathy (Bilateral) 09/24/2016  . Failed back surgical syndrome (x 3) 09/24/2016  . Epidural fibrosis 09/24/2016  . Neurogenic pain 09/24/2016  . Musculoskeletal pain 09/24/2016  . Chronic lumbar radicular pain (L5/S1 dermatome) (Left) 09/24/2016  . History of total  hip replacement, bilateral 09/24/2016  . GERD (gastroesophageal reflux disease) 08/03/2015  . Rheumatoid factor positive 08/03/2015  . Chronic fatigue 08/03/2015  . Vitamin D deficiency 08/03/2015  . Insomnia 08/03/2015  . Fibromyalgia 08/03/2015  . Chronic low back pain (Primary Source of Pain) (Left) 08/03/2015  . Chronic pain syndrome 08/03/2015  . Depression 08/03/2015  . Hypertension 08/03/2015  . Acute anxiety 08/03/2015  . 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) 04/22/2012   PCP:  Glori Luis, MD Pharmacy:   [facility] - [place], [place] - [address] [place] [address] Phone: [phone] Fax: [phone]  [id number] [place], [place] - [address] AT [facility] [address] [place] [address] Phone: [phone] Fax: [phone]     Social Determinants of Health (SDOH) Interventions    Readmission Risk Interventions No flowsheet data found.

## 2020-12-15 NOTE — Progress Notes (Signed)
Attempted to assist patient to the chair, offered to help with brushing teeth and ADLs. Pt adamantly refused multiple times and appeared irritated. Will attempt again at a later time.

## 2020-12-16 DIAGNOSIS — J1282 Pneumonia due to coronavirus disease 2019: Secondary | ICD-10-CM | POA: Diagnosis not present

## 2020-12-16 DIAGNOSIS — U071 COVID-19: Secondary | ICD-10-CM | POA: Diagnosis not present

## 2020-12-16 LAB — CBC WITH DIFFERENTIAL/PLATELET
Abs Immature Granulocytes: 0.02 10*3/uL (ref 0.00–0.07)
Basophils Absolute: 0 10*3/uL (ref 0.0–0.1)
Basophils Relative: 0 %
Eosinophils Absolute: 0 10*3/uL (ref 0.0–0.5)
Eosinophils Relative: 0 %
HCT: 35.7 % — ABNORMAL LOW (ref 36.0–46.0)
Hemoglobin: 12.2 g/dL (ref 12.0–15.0)
Immature Granulocytes: 1 %
Lymphocytes Relative: 35 %
Lymphs Abs: 1.3 10*3/uL (ref 0.7–4.0)
MCH: 29 pg (ref 26.0–34.0)
MCHC: 34.2 g/dL (ref 30.0–36.0)
MCV: 84.8 fL (ref 80.0–100.0)
Monocytes Absolute: 0.4 10*3/uL (ref 0.1–1.0)
Monocytes Relative: 10 %
Neutro Abs: 1.9 10*3/uL (ref 1.7–7.7)
Neutrophils Relative %: 54 %
Platelets: 192 10*3/uL (ref 150–400)
RBC: 4.21 MIL/uL (ref 3.87–5.11)
RDW: 12.8 % (ref 11.5–15.5)
Smear Review: NORMAL
WBC: 3.6 10*3/uL — ABNORMAL LOW (ref 4.0–10.5)
nRBC: 0 % (ref 0.0–0.2)

## 2020-12-16 LAB — COMPREHENSIVE METABOLIC PANEL
ALT: 18 U/L (ref 0–44)
AST: 38 U/L (ref 15–41)
Albumin: 3.2 g/dL — ABNORMAL LOW (ref 3.5–5.0)
Alkaline Phosphatase: 58 U/L (ref 38–126)
Anion gap: 8 (ref 5–15)
BUN: 15 mg/dL (ref 8–23)
CO2: 24 mmol/L (ref 22–32)
Calcium: 8.5 mg/dL — ABNORMAL LOW (ref 8.9–10.3)
Chloride: 104 mmol/L (ref 98–111)
Creatinine, Ser: 0.75 mg/dL (ref 0.44–1.00)
GFR, Estimated: >60 mL/min (ref >60)
Glucose, Bld: 90 mg/dL (ref 70–99)
Potassium: 3.9 mmol/L (ref 3.5–5.1)
Sodium: 136 mmol/L (ref 135–145)
Total Bilirubin: 0.6 mg/dL (ref 0.3–1.2)
Total Protein: 7 g/dL (ref 6.5–8.1)

## 2020-12-16 LAB — C-REACTIVE PROTEIN: CRP: 1.1 mg/dL — ABNORMAL HIGH (ref <1.0)

## 2020-12-16 LAB — FIBRIN DERIVATIVES D-DIMER (ARMC ONLY): Fibrin derivatives D-dimer (ARMC): 1540.7 ng/mL (FEU) — ABNORMAL HIGH (ref 0.00–499.00)

## 2020-12-16 MED ORDER — VITAMIN C 250 MG PO TABS: 500.0000 mg | ORAL_TABLET | Freq: Every day | ORAL | 0 refills | Status: AC

## 2020-12-16 MED ORDER — ZINC 220 (50 ZN) MG PO CAPS: 220.0000 mg | ORAL_CAPSULE | Freq: Every day | ORAL | 0 refills | Status: DC

## 2020-12-16 MED ORDER — ALBUTEROL SULFATE HFA 108 (90 BASE) MCG/ACT IN AERS: 2.0000 | INHALATION_SPRAY | Freq: Four times a day (QID) | RESPIRATORY_TRACT | 2 refills | Status: DC | PRN

## 2020-12-16 MED ORDER — GUAIFENESIN-DM 100-10 MG/5ML PO SYRP: 5.0000 mL | ORAL_SOLUTION | ORAL | 0 refills | Status: DC | PRN

## 2020-12-16 NOTE — Discharge Instructions (Signed)
10 Things You Can Do to Manage Your COVID-19 Symptoms at Home If you have possible or confirmed COVID-19: 1. Stay home from work and school. And stay away from other public places. If you must go out, avoid using any kind of public transportation, ridesharing, or taxis. 2. Monitor your symptoms carefully. If your symptoms get worse, call your healthcare provider immediately. 3. Get rest and stay hydrated. 4. If you have a medical appointment, call the healthcare provider ahead of time and tell them that you have or may have COVID-19. 5. For medical emergencies, call 911 and notify the dispatch personnel that you have or may have COVID-19. 6. Cover your cough and sneezes with a tissue or use the inside of your elbow. 7. Wash your hands often with soap and water for at least 20 seconds or clean your hands with an alcohol-based hand sanitizer that contains at least 60% alcohol. 8. As much as possible, stay in a specific room and away from other people in your home. Also, you should use a separate bathroom, if available. If you need to be around other people in or outside of the home, wear a mask. 9. Avoid sharing personal items with other people in your household, like dishes, towels, and bedding. 10. Clean all surfaces that are touched often, like counters, tabletops, and doorknobs. Use household cleaning sprays or wipes according to the label instructions. [email or web address] 06/17/2019 This information is not intended to replace advice given to you by your health care provider. Make sure you discuss any questions you have with your health care provider. Document Revised: 11/19/2019 Document Reviewed: 11/19/2019 Elsevier Patient Education  2020 [place].   COVID-19 COVID-19 is a respiratory infection that is caused by a virus called severe acute respiratory syndrome coronavirus 2 (SARS-CoV-2). The disease is also known as coronavirus disease or novel coronavirus. In some people, the virus may  not cause any symptoms. In others, it may cause a serious infection. The infection can get worse quickly and can lead to complications, such as:  Pneumonia, or infection of the lungs.  Acute respiratory distress syndrome or ARDS. This is a condition in which fluid build-up in the lungs prevents the lungs from filling with air and passing oxygen into the blood.  Acute respiratory failure. This is a condition in which there is not enough oxygen passing from the lungs to the body or when carbon dioxide is not passing from the lungs out of the body.  Sepsis or septic shock. This is a serious bodily reaction to an infection.  Blood clotting problems.  Secondary infections due to bacteria or fungus.  Organ failure. This is when your body's organs stop working. The virus that causes COVID-19 is contagious. This means that it can spread from person to person through droplets from coughs and sneezes (respiratory secretions). What are the causes? This illness is caused by a virus. You may catch the virus by:  Breathing in droplets from an infected person. Droplets can be spread by a person breathing, speaking, singing, coughing, or sneezing.  Touching something, like a table or a doorknob, that was exposed to the virus (contaminated) and then touching your mouth, nose, or eyes. What increases the risk? Risk for infection You are more likely to be infected with this virus if you:  Are within 6 feet (2 meters) of a person with COVID-19.  Provide care for or live with a person who is infected with COVID-19.  Spend time in crowded indoor spaces or  live in shared housing. Risk for serious illness You are more likely to become seriously ill from the virus if you:  Are 27 years of age or older. The higher your age, the more you are at risk for serious illness.  Live in a nursing home or long-term care facility.  Have cancer.  Have a long-term (chronic) disease such as: ? Chronic lung disease,  including chronic obstructive pulmonary disease or asthma. ? A long-term disease that lowers your body's ability to fight infection (immunocompromised). ? Heart disease, including heart failure, a condition in which the arteries that lead to the heart become narrow or blocked (coronary artery disease), a disease which makes the heart muscle thick, weak, or stiff (cardiomyopathy). ? Diabetes. ? Chronic kidney disease. ? Sickle cell disease, a condition in which red blood cells have an abnormal "sickle" shape. ? Liver disease.  Are obese. What are the signs or symptoms? Symptoms of this condition can range from mild to severe. Symptoms may appear any time from 2 to 14 days after being exposed to the virus. They include:  A fever or chills.  A cough.  Difficulty breathing.  Headaches, body aches, or muscle aches.  Runny or stuffy (congested) nose.  A sore throat.  New loss of taste or smell. Some people may also have stomach problems, such as nausea, vomiting, or diarrhea. Other people may not have any symptoms of COVID-19. How is this diagnosed? This condition may be diagnosed based on:  Your signs and symptoms, especially if: ? You live in an area with a COVID-19 outbreak. ? You recently traveled to or from an area where the virus is common. ? You provide care for or live with a person who was diagnosed with COVID-19. ? You were exposed to a person who was diagnosed with COVID-19.  A physical exam.  Lab tests, which may include: ? Taking a sample of fluid from the back of your nose and throat (nasopharyngeal fluid), your nose, or your throat using a swab. ? A sample of mucus from your lungs (sputum). ? Blood tests.  Imaging tests, which may include, X-rays, CT scan, or ultrasound. How is this treated? At present, there is no medicine to treat COVID-19. Medicines that treat other diseases are being used on a trial basis to see if they are effective against COVID-19. Your  health care provider will talk with you about ways to treat your symptoms. For most people, the infection is mild and can be managed at home with rest, fluids, and over-the-counter medicines. Treatment for a serious infection usually takes places in a hospital intensive care unit (ICU). It may include one or more of the following treatments. These treatments are given until your symptoms improve.  Receiving fluids and medicines through an IV.  Supplemental oxygen. Extra oxygen is given through a tube in the nose, a face mask, or a hood.  Positioning you to lie on your stomach (prone position). This makes it easier for oxygen to get into the lungs.  Continuous positive airway pressure (CPAP) or bi-level positive airway pressure (BPAP) machine. This treatment uses mild air pressure to keep the airways open. A tube that is connected to a motor delivers oxygen to the body.  Ventilator. This treatment moves air into and out of the lungs by using a tube that is placed in your windpipe.  Tracheostomy. This is a procedure to create a hole in the neck so that a breathing tube can be inserted.  Extracorporeal membrane  oxygenation (ECMO). This procedure gives the lungs a chance to recover by taking over the functions of the heart and lungs. It supplies oxygen to the body and removes carbon dioxide. Follow these instructions at home: Lifestyle  If you are sick, stay home except to get medical care. Your health care provider will tell you how long to stay home. Call your health care provider before you go for medical care.  Rest at home as told by your health care provider.  Do not use any products that contain nicotine or tobacco, such as cigarettes, e-cigarettes, and chewing tobacco. If you need help quitting, ask your health care provider.  Return to your normal activities as told by your health care provider. Ask your health care provider what activities are safe for you. General  instructions  Take over-the-counter and prescription medicines only as told by your health care provider.  Drink enough fluid to keep your urine pale yellow.  Keep all follow-up visits as told by your health care provider. This is important. How is this prevented?  There is no vaccine to help prevent COVID-19 infection. However, there are steps you can take to protect yourself and others from this virus. To protect yourself:   Do not travel to areas where COVID-19 is a risk. The areas where COVID-19 is reported change often. To identify high-risk areas and travel restrictions, check the CDC travel website: FatFares.com.br  If you live in, or must travel to, an area where COVID-19 is a risk, take precautions to avoid infection. ? Stay away from people who are sick. ? Wash your hands often with soap and water for 20 seconds. If soap and water are not available, use an alcohol-based hand sanitizer. ? Avoid touching your mouth, face, eyes, or nose. ? Avoid going out in public, follow guidance from your state and local health authorities. ? If you must go out in public, wear a cloth face covering or face mask. Make sure your mask covers your nose and mouth. ? Avoid crowded indoor spaces. Stay at least 6 feet (2 meters) away from others. ? Disinfect objects and surfaces that are frequently touched every day. This may include:  Counters and tables.  Doorknobs and light switches.  Sinks and faucets.  Electronics, such as phones, remote controls, keyboards, computers, and tablets. To protect others: If you have symptoms of COVID-19, take steps to prevent the virus from spreading to others.  If you think you have a COVID-19 infection, contact your health care provider right away. Tell your health care team that you think you may have a COVID-19 infection.  Stay home. Leave your house only to seek medical care. Do not use public transport.  Do not travel while you are  sick.  Wash your hands often with soap and water for 20 seconds. If soap and water are not available, use alcohol-based hand sanitizer.  Stay away from other members of your household. Let healthy household members care for children and pets, if possible. If you have to care for children or pets, wash your hands often and wear a mask. If possible, stay in your own room, separate from others. Use a different bathroom.  Make sure that all people in your household wash their hands well and often.  Cough or sneeze into a tissue or your sleeve or elbow. Do not cough or sneeze into your hand or into the air.  Wear a cloth face covering or face mask. Make sure your mask covers your nose  and mouth. Where to find more information  Centers for Disease Control and Prevention: PurpleGadgets.be  World Health Organization: https://www.castaneda.info/ Contact a health care provider if:  You live in or have traveled to an area where COVID-19 is a risk and you have symptoms of the infection.  You have had contact with someone who has COVID-19 and you have symptoms of the infection. Get help right away if:  You have trouble breathing.  You have pain or pressure in your chest.  You have confusion.  You have bluish lips and fingernails.  You have difficulty waking from sleep.  You have symptoms that get worse. These symptoms may represent a serious problem that is an emergency. Do not wait to see if the symptoms will go away. Get medical help right away. Call your local emergency services (911 in the [place] Do not drive yourself to the hospital. Let the emergency medical personnel know if you think you have COVID-19. Summary  COVID-19 is a respiratory infection that is caused by a virus. It is also known as coronavirus disease or novel coronavirus. It can cause serious infections, such as pneumonia, acute respiratory distress syndrome, acute respiratory failure,  or sepsis.  The virus that causes COVID-19 is contagious. This means that it can spread from person to person through droplets from breathing, speaking, singing, coughing, or sneezing.  You are more likely to develop a serious illness if you are 61 years of age or older, have a weak immune system, live in a nursing home, or have chronic disease.  There is no medicine to treat COVID-19. Your health care provider will talk with you about ways to treat your symptoms.  Take steps to protect yourself and others from infection. Wash your hands often and disinfect objects and surfaces that are frequently touched every day. Stay away from people who are sick and wear a mask if you are sick. This information is not intended to replace advice given to you by your health care provider. Make sure you discuss any questions you have with your health care provider. Document Revised: 10/02/2019 Document Reviewed: 01/08/2019 Elsevier Patient Education  2020 [place].  COVID-19: How to Protect Yourself and Others Know how it spreads  There is currently no vaccine to prevent coronavirus disease 2019 (COVID-19).  The best way to prevent illness is to avoid being exposed to this virus.  The virus is thought to spread mainly from person-to-person. ? Between people who are in close contact with one another (within about 6 feet). ? Through respiratory droplets produced when an infected person coughs, sneezes or talks. ? These droplets can land in the mouths or noses of people who are nearby or possibly be inhaled into the lungs. ? COVID-19 may be spread by people who are not showing symptoms. Everyone should Clean your hands often  Wash your hands often with soap and water for at least 20 seconds especially after you have been in a public place, or after blowing your nose, coughing, or sneezing.  If soap and water are not readily available, use a hand sanitizer that contains at least 60% alcohol. Cover  all surfaces of your hands and rub them together until they feel dry.  Avoid touching your eyes, nose, and mouth with unwashed hands. Avoid close contact  Limit contact with others as much as possible.  Avoid close contact with people who are sick.  Put distance between yourself and other people. ? Remember that some people without symptoms may be  able to spread virus. ? This is especially important for people who are at higher risk of getting very GainPain.com.cy Cover your mouth and nose with a mask when around others  You could spread COVID-19 to others even if you do not feel sick.  Everyone should wear a mask in public settings and when around people not living in their household, especially when social distancing is difficult to maintain. ? Masks should not be placed on young children under age 51, anyone who has trouble breathing, or is unconscious, incapacitated or otherwise unable to remove the mask without assistance.  The mask is meant to protect other people in case you are infected.  Do NOT use a facemask meant for a Dietitian.  Continue to keep about 6 feet between yourself and others. The mask is not a substitute for social distancing. Cover coughs and sneezes  Always cover your mouth and nose with a tissue when you cough or sneeze or use the inside of your elbow.  Throw used tissues in the trash.  Immediately wash your hands with soap and water for at least 20 seconds. If soap and water are not readily available, clean your hands with a hand sanitizer that contains at least 60% alcohol. Clean and disinfect  Clean AND disinfect frequently touched surfaces daily. This includes tables, doorknobs, light switches, countertops, handles, desks, phones, keyboards, toilets, faucets, and sinks. RackRewards.fr  If surfaces are  dirty, clean them: Use detergent or soap and water prior to disinfection.  Then, use a household disinfectant. You can see a list of EPA-registered household disinfectants here. [email or web address] 08/19/2019 This information is not intended to replace advice given to you by your health care provider. Make sure you discuss any questions you have with your health care provider. Document Revised: 08/27/2019 Document Reviewed: 06/25/2019 Elsevier Patient Education  [place].   COVID-19 Frequently Asked Questions COVID-19 (coronavirus disease) is an infection that is caused by a large family of viruses. Some viruses cause illness in people and others cause illness in animals like camels, cats, and bats. In some cases, the viruses that cause illness in animals can spread to humans. Where did the coronavirus come from? In December 2019, [place] told the [facility]) of several cases of lung disease (human respiratory illness). These cases were linked to an open seafood and livestock market in the city of [place]. The link to the seafood and livestock market suggests that the virus may have spread from animals to humans. However, since that first outbreak in December, the virus has also been shown to spread from person to person. What is the name of the disease and the virus? Disease name Early on, this disease was called novel coronavirus. This is because scientists determined that the disease was caused by a new (novel) respiratory virus. The World Health Organization [facility]) has now named the disease COVID-19, or coronavirus disease. Virus name The virus that causes the disease is called severe acute respiratory syndrome coronavirus 2 (SARS-CoV-2). More information on disease and virus naming World Health Organization [facility]): www.who.int/emergencies/diseases/novel-coronavirus-2019/technical-guidance/naming-the-coronavirus-disease-(covid-2019)-and-the-virus-that-causes-it Who is  at risk for complications from coronavirus disease? Some people may be at higher risk for complications from coronavirus disease. This includes older adults and people who have chronic diseases, such as heart disease, diabetes, and lung disease. If you are at higher risk for complications, take these extra precautions:  Stay home as much as possible.  Avoid social gatherings and travel.  Avoid close contact with others. Stay  at least 6 ft (2 m) away from others, if possible.  Wash your hands often with soap and water for at least 20 seconds.  Avoid touching your face, mouth, nose, or eyes.  Keep supplies on hand at home, such as food, medicine, and cleaning supplies.  If you must go out in public, wear a cloth face covering or face mask. Make sure your mask covers your nose and mouth. How does coronavirus disease spread? The virus that causes coronavirus disease spreads easily from person to person (is contagious). You may catch the virus by:  Breathing in droplets from an infected person. Droplets can be spread by a person breathing, speaking, singing, coughing, or sneezing.  Touching something, like a table or a doorknob, that was exposed to the virus (contaminated) and then touching your mouth, nose, or eyes. Can I get the virus from touching surfaces or objects? There is still a lot that we do not know about the virus that causes coronavirus disease. Scientists are basing a lot of information on what they know about similar viruses, such as:  Viruses cannot generally survive on surfaces for long. They need a human body (host) to survive.  It is more likely that the virus is spread by close contact with people who are sick (direct contact), such as through: ? Shaking hands or hugging. ? Breathing in respiratory droplets that travel through the air. Droplets can be spread by a person breathing, speaking, singing, coughing, or sneezing.  It is less likely that the virus is spread  when a person touches a surface or object that has the virus on it (indirect contact). The virus may be able to enter the body if the person touches a surface or object and then touches his or her face, eyes, nose, or mouth. Can a person spread the virus without having symptoms of the disease? It may be possible for the virus to spread before a person has symptoms of the disease, but this is most likely not the main way the virus is spreading. It is more likely for the virus to spread by being in close contact with people who are sick and breathing in the respiratory droplets spread by a person breathing, speaking, singing, coughing, or sneezing. What are the symptoms of coronavirus disease? Symptoms vary from person to person and can range from mild to severe. Symptoms may include:  Fever or chills.  Cough.  Difficulty breathing or feeling short of breath.  Headaches, body aches, or muscle aches.  Runny or stuffy (congested) nose.  Sore throat.  New loss of taste or smell.  Nausea, vomiting, or diarrhea. These symptoms can appear anywhere from 2 to 14 days after you have been exposed to the virus. Some people may not have any symptoms. If you develop symptoms, call your health care provider. People with severe symptoms may need hospital care. Should I be tested for this virus? Your health care provider will decide whether to test you based on your symptoms, history of exposure, and your risk factors. How does a health care provider test for this virus? Health care providers will collect samples to send for testing. Samples may include:  Taking a swab of fluid from the back of your nose and throat, your nose, or your throat.  Taking fluid from the lungs by having you cough up mucus (sputum) into a sterile cup.  Taking a blood sample. Is there a treatment or vaccine for this virus? Currently, there is no vaccine to  prevent coronavirus disease. Also, there are no medicines like  antibiotics or antivirals to treat the virus. A person who becomes sick is given supportive care, which means rest and fluids. A person may also relieve his or her symptoms by using over-the-counter medicines that treat sneezing, coughing, and runny nose. These are the same medicines that a person takes for the common cold. If you develop symptoms, call your health care provider. People with severe symptoms may need hospital care. What can I do to protect myself and my family from this virus?     You can protect yourself and your family by taking the same actions that you would take to prevent the spread of other viruses. Take the following actions:  Wash your hands often with soap and water for at least 20 seconds. If soap and water are not available, use alcohol-based hand sanitizer.  Avoid touching your face, mouth, nose, or eyes.  Cough or sneeze into a tissue, sleeve, or elbow. Do not cough or sneeze into your hand or the air. ? If you cough or sneeze into a tissue, throw it away immediately and wash your hands.  Disinfect objects and surfaces that you frequently touch every day.  Stay away from people who are sick.  Avoid going out in public, follow guidance from your state and local health authorities.  Avoid crowded indoor spaces. Stay at least 6 ft (2 m) away from others.  If you must go out in public, wear a cloth face covering or face mask. Make sure your mask covers your nose and mouth.  Stay home if you are sick, except to get medical care. Call your health care provider before you get medical care. Your health care provider will tell you how long to stay home.  Make sure your vaccines are up to date. Ask your health care provider what vaccines you need. What should I do if I need to travel? Follow travel recommendations from your local health authority, the CDC, and WHO. Travel information and advice  Centers for Disease Control and Prevention (CDC):  BodyEditor.hu  World Health Organization [facility]): ThirdIncome.ca Know the risks and take action to protect your health  You are at higher risk of getting coronavirus disease if you are traveling to areas with an outbreak or if you are exposed to travelers from areas with an outbreak.  Wash your hands often and practice good hygiene to lower the risk of catching or spreading the virus. What should I do if I am sick? General instructions to stop the spread of infection  Wash your hands often with soap and water for at least 20 seconds. If soap and water are not available, use alcohol-based hand sanitizer.  Cough or sneeze into a tissue, sleeve, or elbow. Do not cough or sneeze into your hand or the air.  If you cough or sneeze into a tissue, throw it away immediately and wash your hands.  Stay home unless you must get medical care. Call your health care provider or local health authority before you get medical care.  Avoid public areas. Do not take public transportation, if possible.  If you can, wear a mask if you must go out of the house or if you are in close contact with someone who is not sick. Make sure your mask covers your nose and mouth. Keep your home clean  Disinfect objects and surfaces that are frequently touched every day. This may include: ? Counters and tables. ? Doorknobs and light  switches. ? Sinks and faucets. ? Electronics such as phones, remote controls, keyboards, computers, and tablets.  Wash dishes in hot, soapy water or use a dishwasher. Air-dry your dishes.  Wash laundry in hot water. Prevent infecting other household members  Let healthy household members care for children and pets, if possible. If you have to care for children or pets, wash your hands often and wear a mask.  Sleep in a different bedroom or bed, if possible.  Do not share personal items, such  as razors, toothbrushes, deodorant, combs, brushes, towels, and washcloths. Where to find more information Centers for Disease Control and Prevention (CDC)  Information and news updates: CardRetirement.cz World Health Organization [facility])  Information and news updates: AffordableSalon.es  Coronavirus health topic: https://thompson-craig.com/  Questions and answers on COVID-19: kruiseway.com  Global tracker: who.sprinklr.com American Academy of Pediatrics (AAP)  Information for families: www.healthychildren.org/English/health-issues/conditions/chest-lungs/Pages/2019-Novel-Coronavirus.aspx The coronavirus situation is changing rapidly. Check your local health authority website or the CDC and [facility] websites for updates and news. When should I contact a health care provider?  Contact your health care provider if you have symptoms of an infection, such as fever or cough, and you: ? Have been near anyone who is known to have coronavirus disease. ? Have come into contact with a person who is suspected to have coronavirus disease. ? Have traveled to an area where there is an outbreak of COVID-19. When should I get emergency medical care?  Get help right away by calling your local emergency services (911 in the [place] if you have: ? Trouble breathing. ? Pain or pressure in your chest. ? Confusion. ? Blue-tinged lips and fingernails. ? Difficulty waking from sleep. ? Symptoms that get worse. Let the emergency medical personnel know if you think you have coronavirus disease. Summary  A new respiratory virus is spreading from person to person and causing COVID-19 (coronavirus disease).  The virus that causes COVID-19 appears to spread easily. It spreads from one person to another through droplets from breathing, speaking, singing, coughing, or sneezing.  Older adults and those with chronic  diseases are at higher risk of disease. If you are at higher risk for complications, take extra precautions.  There is currently no vaccine to prevent coronavirus disease. There are no medicines, such as antibiotics or antivirals, to treat the virus.  You can protect yourself and your family by washing your hands often, avoiding touching your face, and covering your coughs and sneezes. This information is not intended to replace advice given to you by your health care provider. Make sure you discuss any questions you have with your health care provider. Document Revised: 10/02/2019 Document Reviewed: 03/31/2019 Elsevier Patient Education  2020 Elsevier Inc.    Person Under Monitoring Name: Maria Hamilton  Location: [address] [place] [address]   Infection Prevention Recommendations for Individuals Confirmed to have, or Being Evaluated for, 2019 Novel Coronavirus (COVID-19) Infection Who Receive Care at Home  Individuals who are confirmed to have, or are being evaluated for, COVID-19 should follow the prevention steps below until a healthcare provider or local or state health department says they can return to normal activities.  Stay home except to get medical care You should restrict activities outside your home, except for getting medical care. Do not go to work, school, or public areas, and do not use public transportation or taxis.  Call ahead before visiting your doctor Before your medical appointment, call the healthcare provider and tell them that you have, or are  being evaluated for, COVID-19 infection. This will help the healthcare providers office take steps to keep other people from getting infected. Ask your healthcare provider to call the local or state health department.  Monitor your symptoms Seek prompt medical attention if your illness is worsening (e.g., difficulty breathing). Before going to your medical appointment, call the healthcare  provider and tell them that you have, or are being evaluated for, COVID-19 infection. Ask your healthcare provider to call the local or state health department.  Wear a facemask You should wear a facemask that covers your nose and mouth when you are in the same room with other people and when you visit a healthcare provider. People who live with or visit you should also wear a facemask while they are in the same room with you.  Separate yourself from other people in your home As much as possible, you should stay in a different room from other people in your home. Also, you should use a separate bathroom, if available.  Avoid sharing household items You should not share dishes, drinking glasses, cups, eating utensils, towels, bedding, or other items with other people in your home. After using these items, you should wash them thoroughly with soap and water.  Cover your coughs and sneezes Cover your mouth and nose with a tissue when you cough or sneeze, or you can cough or sneeze into your sleeve. Throw used tissues in a lined trash can, and immediately wash your hands with soap and water for at least 20 seconds or use an alcohol-based hand rub.  Wash your [facility] your hands often and thoroughly with soap and water for at least 20 seconds. You can use an alcohol-based hand sanitizer if soap and water are not available and if your hands are not visibly dirty. Avoid touching your eyes, nose, and mouth with unwashed hands.   Prevention Steps for Caregivers and Household Members of Individuals Confirmed to have, or Being Evaluated for, COVID-19 Infection Being Cared for in the Home  If you live with, or provide care at home for, a person confirmed to have, or being evaluated for, COVID-19 infection please follow these guidelines to prevent infection:  Follow healthcare providers instructions Make sure that you understand and can help the patient follow any healthcare provider  instructions for all care.  Provide for the patients basic needs You should help the patient with basic needs in the home and provide support for getting groceries, prescriptions, and other personal needs.  Monitor the patients symptoms If they are getting sicker, call his or her medical provider and tell them that the patient has, or is being evaluated for, COVID-19 infection. This will help the healthcare providers office take steps to keep other people from getting infected. Ask the healthcare provider to call the local or state health department.  Limit the number of people who have contact with the patient  If possible, have only one caregiver for the patient.  Other household members should stay in another home or place of residence. If this is not possible, they should stay  in another room, or be separated from the patient as much as possible. Use a separate bathroom, if available.  Restrict visitors who do not have an essential need to be in the home.  Keep older adults, very young children, and other sick people away from the patient Keep older adults, very young children, and those who have compromised immune systems or chronic health conditions away from the patient. This  includes people with chronic heart, lung, or kidney conditions, diabetes, and cancer.  Ensure good ventilation Make sure that shared spaces in the home have good air flow, such as from an air conditioner or an opened window, weather permitting.  Wash your hands often  Wash your hands often and thoroughly with soap and water for at least 20 seconds. You can use an alcohol based hand sanitizer if soap and water are not available and if your hands are not visibly dirty.  Avoid touching your eyes, nose, and mouth with unwashed hands.  Use disposable paper towels to dry your hands. If not available, use dedicated cloth towels and replace them when they become wet.  Wear a facemask and gloves  Wear a  disposable facemask at all times in the room and gloves when you touch or have contact with the patients blood, body fluids, and/or secretions or excretions, such as sweat, saliva, sputum, nasal mucus, vomit, urine, or feces.  Ensure the mask fits over your nose and mouth tightly, and do not touch it during use.  Throw out disposable facemasks and gloves after using them. Do not reuse.  Wash your hands immediately after removing your facemask and gloves.  If your personal clothing becomes contaminated, carefully remove clothing and launder. Wash your hands after handling contaminated clothing.  Place all used disposable facemasks, gloves, and other waste in a lined container before disposing them with other household waste.  Remove gloves and wash your hands immediately after handling these items.  Do not share dishes, glasses, or other household items with the patient  Avoid sharing household items. You should not share dishes, drinking glasses, cups, eating utensils, towels, bedding, or other items with a patient who is confirmed to have, or being evaluated for, COVID-19 infection.  After the person uses these items, you should wash them thoroughly with soap and water.  Wash laundry thoroughly  Immediately remove and wash clothes or bedding that have blood, body fluids, and/or secretions or excretions, such as sweat, saliva, sputum, nasal mucus, vomit, urine, or feces, on them.  Wear gloves when handling laundry from the patient.  Read and follow directions on labels of laundry or clothing items and detergent. In general, wash and dry with the warmest temperatures recommended on the label.  Clean all areas the individual has used often  Clean all touchable surfaces, such as counters, tabletops, doorknobs, bathroom fixtures, toilets, phones, keyboards, tablets, and bedside tables, every day. Also, clean any surfaces that may have blood, body fluids, and/or secretions or excretions on  them.  Wear gloves when cleaning surfaces the patient has come in contact with.  Use a diluted bleach solution (e.g., dilute bleach with 1 part bleach and 10 parts water) or a household disinfectant with a label that says EPA-registered for coronaviruses. To make a bleach solution at home, add 1 tablespoon of bleach to 1 quart (4 cups) of water. For a larger supply, add  cup of bleach to 1 gallon (16 cups) of water.  Read labels of cleaning products and follow recommendations provided on product labels. Labels contain instructions for safe and effective use of the cleaning product including precautions you should take when applying the product, such as wearing gloves or eye protection and making sure you have good ventilation during use of the product.  Remove gloves and wash hands immediately after cleaning.  Monitor yourself for signs and symptoms of illness Caregivers and household members are considered close contacts, should monitor their health, and  will be asked to limit movement outside of the home to the extent possible. Follow the monitoring steps for close contacts listed on the symptom monitoring form.   ? If you have additional questions, contact your local health department or call the epidemiologist on call at [phone] (available 24/7). ? This guidance is subject to change. For the most up-to-date guidance from [facility], please refer to their website: YouBlogs.pl

## 2020-12-16 NOTE — Discharge Summary (Signed)
Physician Discharge Summary  Maria Hamilton ZOX:[id number] DOB: [birth year] DOA: 12/12/2020  PCP: Glori Luis, MD  Admit date: 12/12/2020 Discharge date: 12/16/2020  Admitted From: Home Disposition: Home  Recommendations for Outpatient Follow-up:  1. Follow up with PCP in 1-2 weeks 2. Continue albuterol MDI as needed, zinc, vitamin C, Tylenol, antitussives as needed 3. Encourage Covid-19 vaccination 4. Discontinue amlodipine and spironolactone secondary to borderline hypotension  Home Health: PT/OT/RN Equipment/Devices: 3 in 1 bedside commode  Discharge Condition: Stable CODE STATUS: Full code Diet recommendation: Heart healthy diet  History of present illness:  Maria Hamilton is a 69 year old female with past medical history significant for depression, chronic back pain/pain syndrome s/p multiple surgical interventions, GERD, essential hypertension, insomnia, vitamin D deficiency, who presented to the ED with progressive cough, shortness of breath, weakness, fatigue and unsteady gait.  Patient unvaccinated against Covid-19.  In the ED, temperature 100.2.  Covid-19 PCR positive.  Influenza a/B PCR negative.  Sodium 134, potassium 3.9, chloride 99, CO2 25, glucose 94, BUN 12, creatinine 0.87.  WBC 4.3, hemoglobin 13.0, platelets 184.  Troponin 9.  CRP 1.2.  Fibrin derivatives 1667.I Chest x-ray with patchy bibasilar infiltrates consistent with viral pneumonia.  Ambulation was attempted by nursing staff in the ED in which she lost her balance multiple times and almost fell.  Hospitalist service was consulted for admission and further evaluation and management of acute viral infection with associated weakness/debility.  Hospital course:  Acute Covid-19 viral infection during the ongoing 2020/2021 Covid 19 Pandemic - POA Patient presenting with progressive weakness, cough and shortness of breath.  Chest x-ray with findings consistent with multifocal viral pneumonia.  Covid-19  PCR positive on 12/12/2020.  Patient was not hypoxic during hospitalization.  She completed 5-day course of remdesivir.  Continue supportive care with albuterol MDI as needed, vitamin C, zinc, Tylenol and antitussives as needed.  Continue home isolation/quarantine per CDC guidelines.  Recommend Covid-19 vaccination.  Outpatient follow-up with PCP.  The treatment plan and use of medications and known side effects were discussed with patient/family. Some of the medications used are based on case reports/anecdotal data.  All other medications being used in the management of COVID-19 based on limited study data.  Complete risks and long-term side effects are unknown, however in the best clinical judgment they seem to be of some benefit.  Patient wanted to proceed with treatment options provided.  Hyperlipidemia Crestor 20 mg p.o. daily  Depression/anxiety Lexapro 10 mg p.o. daily  Essential hypertension Continue Coreg 12.5 mg p.o. twice daily.  Discontinued amlodipine and spironolactone due to borderline hypotension.  Follow-up with PCP for further guidance.  Vitamin D deficiency Cholecalciferol 1000 units p.o. daily  Chronic pain syndrome/low back pain Zanaflex 4 mg p.o. nightly as needed, Gabapentin 100 mg p.o. twice daily  Weakness/debility/deconditioning: Seen by PT/OT with recommendations of home health and 3:1 bedside commode  Discharge Diagnoses:  Principal Problem:   Pneumonia due to COVID-19 virus Active Problems:   Generalized weakness   Unsteady gait   COVID-19 virus infection    Discharge Instructions  Discharge Instructions    Call MD for:  difficulty breathing, headache or visual disturbances   Complete by: As directed    Call MD for:  extreme fatigue   Complete by: As directed    Call MD for:  persistant dizziness or light-headedness   Complete by: As directed    Call MD for:  persistant nausea and vomiting   Complete by: As directed    Call  MD for:   severe uncontrolled pain   Complete by: As directed    Call MD for:  temperature >100.4   Complete by: As directed    Diet - low sodium heart healthy   Complete by: As directed    Increase activity slowly   Complete by: As directed      Allergies as of 12/16/2020      Reactions   Ace Inhibitors Swelling   Angioedema Angioedema Angioedema   Ciprofloxacin Itching, Rash   Fentanyl Itching   Hctz [hydrochlorothiazide]    hypoK    Latex    Morphine And Related Itching   Penicillin G Benzathine    Isovue M [iopamidol] Itching   Pt was given of Isovue 370 for CT Scan today @ 11:10am. After injection she started itching all over. We gave her 50mG  of Benadryl IV.    Tape Rash      Medication List    STOP taking these medications   amLODipine 10 MG tablet Commonly known as: NORVASC   spironolactone 25 MG tablet Commonly known as: ALDACTONE     TAKE these medications   albuterol 108 (90 Base) MCG/ACT inhaler Commonly known as: VENTOLIN HFA Inhale 2 puffs into the lungs every 6 (six) hours as needed for wheezing or shortness of breath.   calcium carbonate 1500 (600 Ca) MG Tabs tablet Commonly known as: OSCAL Take 600 mg of elemental calcium by mouth daily with breakfast.   carvedilol 12.5 MG tablet Commonly known as: COREG Take 1 tablet (12.5 mg total) by mouth 2 (two) times daily with a meal.   cholecalciferol 1000 units tablet Commonly known as: VITAMIN D Take 1,000 Units by mouth daily.   escitalopram 10 MG tablet Commonly known as: LEXAPRO TAKE 1 TABLET EVERY DAY   gabapentin 600 MG tablet Commonly known as: NEURONTIN Take 600 mg by mouth at bedtime.   gabapentin 300 MG capsule Commonly known as: NEURONTIN Take 1 capsule (300 mg total) by mouth in the morning AND 2 capsules (600 mg total) at bedtime.   guaiFENesin-dextromethorphan 100-10 MG/5ML syrup Commonly known as: ROBITUSSIN DM Take 5 mLs by mouth every 4 (four) hours as needed for cough.    Magnesium 250 MG Tabs Take by mouth at bedtime.   potassium chloride SA 20 MEQ tablet Commonly known as: KLOR-CON TAKE 1 TABLET EVERY DAY   rosuvastatin 20 MG tablet Commonly known as: CRESTOR TAKE 1 TABLET EVERY DAY   tiZANidine 2 MG tablet Commonly known as: ZANAFLEX Take 2 tablets (4 mg total) by mouth at bedtime as needed for muscle spasms.   triamcinolone ointment 0.5 % Commonly known as: KENALOG Apply 1 application topically 2 (two) times daily. Prn Left neck and right inner arm x1-2 weeks prn   vitamin C 250 MG tablet Commonly known as: ASCORBIC ACID Take 2 tablets (500 mg total) by mouth daily.   Zinc 220 (50 Zn) MG Caps Take 220 mg by mouth daily.            Durable Medical Equipment  (From admission, onward)         Start     Ordered   12/15/20 1214  For home use only DME Tub bench  Once        12/15/20 1213   12/15/20 1213  For home use only DME 3 n 1  Once        12/15/20 1213          Follow-up Information  Glori Luis, MD. Schedule an appointment as soon as possible for a visit in 1 week(s).   Specialty: Family Medicine Contact information: [address] [place] [address] [phone]              Allergies  Allergen Reactions  . Ace Inhibitors Swelling    Angioedema Angioedema Angioedema  . Ciprofloxacin Itching and Rash  . Fentanyl Itching  . Hctz [Hydrochlorothiazide]     hypoK   . Latex   . Morphine And Related Itching  . Penicillin G Benzathine   . Isovue M [Iopamidol] Itching    Pt was given of Isovue 370 for CT Scan today @ 11:10am. After injection she started itching all over. We gave her 50mG  of Benadryl IV.   . Tape Rash    Consultations:  None   Procedures/Studies: DG Chest 1 View  Result Date: 12/12/2020 CLINICAL DATA:  Cough and congestion for 1 week. EXAM: CHEST  1 VIEW COMPARISON:  09/29/2018 FINDINGS: The cardiac silhouette, mediastinal and hilar contours are within  normal limits. Mild tortuosity and calcification of the thoracic aorta. There are patchy bibasilar infiltrates. No pleural effusions or pneumothorax. The bony thorax is intact. Remote surgical changes involving the right shoulder. IMPRESSION: Patchy bibasilar infiltrates. Electronically Signed   By: Rudie Meyer M.D.   On: 12/12/2020 09:11      Subjective: Patient seen and examined bedside, resting comfortably.  States yesterday was having a bad day, much improved today.  She is eager and ready for discharge home today.  No other questions or concerns at this time.  Denies headache, no fever/chills/night sweats, no nausea/vomiting/diarrhea, no chest pain, no palpitations, no shortness of breath, no abdominal pain, no fatigue, no paresthesias.  No acute events overnight per nursing staff.  Discharge Exam: Vitals:   12/16/20 0633 12/16/20 0744  BP: 107/67 116/68  Pulse: (!) 58 (!) 56  Resp: 16 19  Temp: 98.2 F (36.8 C) 98.5 F (36.9 C)  SpO2: 95% 99%   Vitals:   12/15/20 1950 12/16/20 0011 12/16/20 0633 12/16/20 0744  BP: 135/82 113/66 107/67 116/68  Pulse: 61 (!) 55 (!) 58 (!) 56  Resp: 16 15 16 19   Temp: 98.5 F (36.9 C) 98.3 F (36.8 C) 98.2 F (36.8 C) 98.5 F (36.9 C)  TempSrc: Oral Oral Oral   SpO2: 100% 98% 95% 99%  Weight:      Height:        General: Pt is alert, awake, not in acute distress Cardiovascular: RRR, S1/S2 +, no rubs, no gallops Respiratory: CTA bilaterally, no wheezing, no rhonchi, on room air Abdominal: Soft, NT, ND, bowel sounds + Extremities: no edema, no cyanosis    The results of significant diagnostics from this hospitalization (including imaging, microbiology, ancillary and laboratory) are listed below for reference.     Microbiology: Recent Results (from the past 240 hour(s))  Resp Panel by RT-PCR (Flu A&B, Covid) Nasopharyngeal Swab     Status: Abnormal   Collection Time: 12/12/20  8:13 AM   Specimen: Nasopharyngeal Swab;  Nasopharyngeal(NP) swabs in vial transport medium  Result Value Ref Range Status   SARS Coronavirus 2 by RT PCR POSITIVE (A) NEGATIVE Final    Comment: RESULT CALLED TO, READ BACK BY AND VERIFIED WITH: LORRIE LEMONS AT 9528 ON 12/12/2020 [facility]. (NOTE) SARS-CoV-2 target nucleic acids are DETECTED.  The SARS-CoV-2 RNA is generally detectable in upper respiratory specimens during the acute phase of infection. Positive results are indicative  of the presence of the identified virus, but do not rule out bacterial infection or co-infection with other pathogens not detected by the test. Clinical correlation with patient history and other diagnostic information is necessary to determine patient infection status. The expected result is Negative.  Fact Sheet for Patients: BloggerCourse.com  Fact Sheet for Healthcare Providers: SeriousBroker.it  This test is not yet approved or cleared by the [place] FDA and  has been authorized for detection and/or diagnosis of SARS-CoV-2 by FDA under an Emergency Use Authorization (EUA).  This EUA will remain in effect (meaning this test  can be used) for the duration of  the COVID-19 declaration under Section 564(b)(1) of the Act, 21 U.S.C. section 360bbb-3(b)(1), unless the authorization is terminated or revoked sooner.     Influenza A by PCR NEGATIVE NEGATIVE Final   Influenza B by PCR NEGATIVE NEGATIVE Final    Comment: (NOTE) The Xpert Xpress SARS-CoV-2/FLU/RSV plus assay is intended as an aid in the diagnosis of influenza from Nasopharyngeal swab specimens and should not be used as a sole basis for treatment. Nasal washings and aspirates are unacceptable for Xpert Xpress SARS-CoV-2/FLU/RSV testing.  Fact Sheet for Patients: BloggerCourse.com  Fact Sheet for Healthcare Providers: SeriousBroker.it  This test is not yet approved or cleared by  the [place] FDA and has been authorized for detection and/or diagnosis of SARS-CoV-2 by FDA under an Emergency Use Authorization (EUA). This EUA will remain in effect (meaning this test can be used) for the duration of the COVID-19 declaration under Section 564(b)(1) of the Act, 21 U.S.C. section 360bbb-3(b)(1), unless the authorization is terminated or revoked.  Performed at [facility], [address] [place], [place] [address]      Labs: BNP (last 3 results) No results for input(s): BNP in the last 8760 hours. Basic Metabolic Panel: Recent Labs  Lab 12/12/20 1055 12/13/20 0606 12/14/20 0448 12/15/20 0411 12/16/20 0430  NA 134* 137 135 136 136  K 3.9 3.9 3.8 3.4* 3.9  CL 99 104 99 102 104  CO2 25 25 26 23 24   GLUCOSE 94 106* 101* 117* 90  BUN 12 11 13 16 15   CREATININE 0.87 0.71 0.97 0.78 0.75  CALCIUM 8.7* 8.6* 8.7* 8.5* 8.5*  MG  --  1.9 1.8  --   --   PHOS  --  2.5 4.4  --   --    Liver Function Tests: Recent Labs  Lab 12/12/20 1055 12/13/20 0606 12/14/20 0448 12/15/20 0411 12/16/20 0430  AST 34 31 30 32 38  ALT 16 16 15 16 18   ALKPHOS 70 66 60 55 58  BILITOT 0.4 0.4 0.6 0.6 0.6  PROT 8.1 7.4 7.4 7.0 7.0  ALBUMIN 3.8 3.4* 3.4* 3.2* 3.2*   No results for input(s): LIPASE, AMYLASE in the last 168 hours. No results for input(s): AMMONIA in the last 168 hours. CBC: Recent Labs  Lab 12/12/20 1055 12/13/20 0606 12/14/20 0448 12/15/20 0411 12/16/20 0430  WBC 4.3 2.4* 2.4* 2.4* 3.6*  NEUTROABS  --  1.4* 1.0* 1.0* 1.9  HGB 13.0 12.4 11.5* 11.5* 12.2  HCT 40.1 37.0 35.3* 34.6* 35.7*  MCV 87.6 85.6 87.4 85.6 84.8  PLT 184 159 166 176 192   Cardiac Enzymes: No results for input(s): CKTOTAL, CKMB, CKMBINDEX, TROPONINI in the last 168 hours. BNP: Invalid input(s): POCBNP CBG: Recent Labs  Lab 12/14/20 1645 12/15/20 1947  GLUCAP 92 94   D-Dimer No results for input(s): DDIMER in the last  72 hours. Hgb A1c No results for  input(s): HGBA1C in the last 72 hours. Lipid Profile No results for input(s): CHOL, HDL, LDLCALC, TRIG, CHOLHDL, LDLDIRECT in the last 72 hours. Thyroid function studies No results for input(s): TSH, T4TOTAL, T3FREE, THYROIDAB in the last 72 hours.  Invalid input(s): FREET3 Anemia work up Recent Labs    12/14/20 0448  FERRITIN 198   Urinalysis    Component Value Date/Time   COLORURINE YELLOW (A) 07/10/2018 1707   APPEARANCEUR CLEAR (A) 07/10/2018 1707   LABSPEC 1.005 07/10/2018 1707   PHURINE 6.0 07/10/2018 1707   GLUCOSEU NEGATIVE 07/10/2018 1707   HGBUR NEGATIVE 07/10/2018 1707   BILIRUBINUR NEGATIVE 07/10/2018 1707   KETONESUR NEGATIVE 07/10/2018 1707   PROTEINUR NEGATIVE 07/10/2018 1707   NITRITE NEGATIVE 07/10/2018 1707   LEUKOCYTESUR NEGATIVE 07/10/2018 1707   Sepsis Labs Invalid input(s): PROCALCITONIN,  WBC,  LACTICIDVEN Microbiology Recent Results (from the past 240 hour(s))  Resp Panel by RT-PCR (Flu A&B, Covid) Nasopharyngeal Swab     Status: Abnormal   Collection Time: 12/12/20  8:13 AM   Specimen: Nasopharyngeal Swab; Nasopharyngeal(NP) swabs in vial transport medium  Result Value Ref Range Status   SARS Coronavirus 2 by RT PCR POSITIVE (A) NEGATIVE Final    Comment: RESULT CALLED TO, READ BACK BY AND VERIFIED WITH: LORRIE LEMONS AT 0953 ON 12/12/2020 [facility]. (NOTE) SARS-CoV-2 target nucleic acids are DETECTED.  The SARS-CoV-2 RNA is generally detectable in upper respiratory specimens during the acute phase of infection. Positive results are indicative of the presence of the identified virus, but do not rule out bacterial infection or co-infection with other pathogens not detected by the test. Clinical correlation with patient history and other diagnostic information is necessary to determine patient infection status. The expected result is Negative.  Fact Sheet for Patients: BloggerCourse.com  Fact Sheet for Healthcare  Providers: SeriousBroker.it  This test is not yet approved or cleared by the [place] FDA and  has been authorized for detection and/or diagnosis of SARS-CoV-2 by FDA under an Emergency Use Authorization (EUA).  This EUA will remain in effect (meaning this test  can be used) for the duration of  the COVID-19 declaration under Section 564(b)(1) of the Act, 21 U.S.C. section 360bbb-3(b)(1), unless the authorization is terminated or revoked sooner.     Influenza A by PCR NEGATIVE NEGATIVE Final   Influenza B by PCR NEGATIVE NEGATIVE Final    Comment: (NOTE) The Xpert Xpress SARS-CoV-2/FLU/RSV plus assay is intended as an aid in the diagnosis of influenza from Nasopharyngeal swab specimens and should not be used as a sole basis for treatment. Nasal washings and aspirates are unacceptable for Xpert Xpress SARS-CoV-2/FLU/RSV testing.  Fact Sheet for Patients: BloggerCourse.com  Fact Sheet for Healthcare Providers: SeriousBroker.it  This test is not yet approved or cleared by the [place] FDA and has been authorized for detection and/or diagnosis of SARS-CoV-2 by FDA under an Emergency Use Authorization (EUA). This EUA will remain in effect (meaning this test can be used) for the duration of the COVID-19 declaration under Section 564(b)(1) of the Act, 21 U.S.C. section 360bbb-3(b)(1), unless the authorization is terminated or revoked.  Performed at [facility], [address], [place], [place] [address]      Time coordinating discharge: Over 30 minutes  SIGNED:   Alvira Philips [place], DO  [facility] 12/16/2020, 9:48 AM

## 2020-12-16 NOTE — TOC Transition Note (Signed)
Transition of Care [facility]) - CM/SW Discharge Note   Patient Details  Name: Maria Hamilton MRN: [id number] Date of Birth: 10-22-1951  [facility] CM/SW Contact:  Allayne Butcher, RN Phone Number: 12/16/2020, 10:19 AM   Clinical Narrative:    Patient medically cleared for discharge home with home health today.  Patient reports that her husband will be picking her up today.  Home health services set up with [facility].  Cindy with [facility] is aware of discharge today.    Final next level of care: Home w Home Health Services Barriers to Discharge: Barriers Resolved   Patient Goals and CMS Choice   CMS Medicare.gov Compare Post Acute Care list provided to:: Patient Choice offered to / list presented to : Patient  Discharge Placement                       Discharge Plan and Services   Discharge Planning Services: CM Consult Post Acute Care Choice: Home Health          DME Arranged: 3-N-1 DME Agency: [employer] Date DME Agency Contacted: 12/15/20 Time DME Agency Contacted: 1254 Representative spoke with at DME Agency: Ian Malkin HH Arranged: RN,PT,OT HH Agency: [facility] Date [facility] Agency Contacted: 12/15/20 Time HH Agency Contacted: 1254 Representative spoke with at [facility] Agency: Arline Asp  Social Determinants of Health (SDOH) Interventions     Readmission Risk Interventions No flowsheet data found.

## 2020-12-16 NOTE — Progress Notes (Signed)
Spoke to patient's husb per her request to inform him she has been discharged.  Mr Tani stated he would gather her some clothes and go by pharmacy prior to picking up patient. Pt has refused me to go over discharge instructions with her.  Husb was made aware.

## 2020-12-16 NOTE — Care Management Important Message (Signed)
Important Message  Patient Details  Name: Maria Hamilton MRN: [id number] Date of Birth: [birth year]   Medicare Important Message Given:  Yes Reviewed with patient via phone    Allayne Butcher, RN 12/16/2020, 10:17 AM

## 2020-12-18 ENCOUNTER — Other Ambulatory Visit: Payer: Self-pay | Admitting: Family Medicine

## 2020-12-19 ENCOUNTER — Telehealth: Payer: Self-pay

## 2020-12-19 ENCOUNTER — Telehealth: Payer: Self-pay | Admitting: Family Medicine

## 2020-12-19 NOTE — Telephone Encounter (Signed)
Reviewed

## 2020-12-19 NOTE — Telephone Encounter (Signed)
Transition Care Management Follow-up Telephone Call  Date of discharge and from where: 12/16/20 from [facility]  How have you been since you were released from the hospital? Patient states, " I feel miserable." I have a consistent cough with white thick phlegm. Monitoring oxygen and reports 95 or higher on room air. Denies chest pain, n/v/d, shortness of breath, dizziness, fever, extreme fatigue, headache, visual disturbances, abd pain.   Any questions or concerns? Yes, requests Tussin be sent to local pharmacy.   Items Reviewed:  Did the pt receive and understand the discharge instructions provided? Yes , increase activity slowly.   Medications obtained and verified? Yes , stop amlodipine and spironolactone.   Other? No   Any new allergies since your discharge? No   Dietary orders reviewed? Heart healthy diet.   Do you have support at home? Yes   Home Care and Equipment/Supplies: Were home health services ordered? Yes PT/OT/RN Has the agency set up a time to come to the patient's home? Patient notes she was contacted today and assessment scheduled.  Were any new equipment or medical supplies ordered?  Yes, BSC.  Do you have any questions related to the use of the equipment or supplies? No  Functional Questionnaire: (I = Independent and D = Dependent) ADLs: family assists as needed.   Eating- i  Maintaining continence- BSC in use  Transferring/Ambulation- walker in use as needed  Managing Meds- i  Follow up appointments reviewed:   PCP Hospital f/u appt confirmed? Yes  Scheduled to see Dr. Birdie Hamilton on 12/23/19 @ 11:30 via virtual [phone].  Are transportation arrangements needed? No   If their condition worsens, is the pt aware to call PCP or go to the Emergency Dept.? Yes  Was the patient provided with contact information for the PCP's office or ED? Yes  Was to pt encouraged to call back with questions or concerns?  Yes

## 2020-12-19 NOTE — Telephone Encounter (Signed)
Pt needs a refill on tiZANidine (ZANAFLEX) 2 MG tablet and would like something called in for a cough

## 2020-12-20 NOTE — Telephone Encounter (Signed)
Spoke with patient and she states she does not need the refill for the zanaflex anymore; states it has already been refilled. Patient states she has a cough that produces white mucus. Patient was requesting medication for her cough; I advised patient she would need an appt for any medication. Patient states she was too tired for an appt and did not want to be seen and hung up the phone.

## 2020-12-22 ENCOUNTER — Other Ambulatory Visit: Payer: Self-pay

## 2020-12-22 ENCOUNTER — Inpatient Hospital Stay: Payer: Medicare HMO | Admitting: Family Medicine

## 2020-12-22 NOTE — Patient Outreach (Signed)
[facility]) Care Management  12/22/2020  Maria Hamilton 04/09/1951 [id number]    EMMI-General Discharge RED ON EMMI ALERT Day # 4 Date: 12/21/2020 Red Alert Reason: "Unfilled prescriptions? Yes  Able to fill today/tomorrow? No Sad/hopeless/anxious/empty? Yes Other questions/problems? Yes"   Outreach attempt # 1 to patient. Spoke with patient who was just waking up. She voices that she is feeling and doing "pretty good." She denies any acute issues at present. Reviewed and addressed red alerts with patient. She voices error in response. She states she has her meds except for cough syrup which is too expensive. She has spoken with MD office already regarding it. Patient denies any other issues or concerns at present. She voices that she has heard from home health.     Plan: RN CM will close case at this time.   Antionette Fairy, RN,BSN,CCM [facility] Care Management Telephonic Care Management Coordinator Direct Phone: [phone] Toll Free: [phone] Fax: [phone]

## 2020-12-25 ENCOUNTER — Other Ambulatory Visit: Payer: Self-pay | Admitting: Family Medicine

## 2021-01-02 ENCOUNTER — Telehealth (INDEPENDENT_AMBULATORY_CARE_PROVIDER_SITE_OTHER): Payer: Medicare HMO | Admitting: Family Medicine

## 2021-01-02 ENCOUNTER — Encounter: Payer: Self-pay | Admitting: Family Medicine

## 2021-01-02 DIAGNOSIS — I10 Essential (primary) hypertension: Secondary | ICD-10-CM | POA: Diagnosis not present

## 2021-01-02 DIAGNOSIS — Z8616 Personal history of COVID-19: Secondary | ICD-10-CM | POA: Diagnosis not present

## 2021-01-02 DIAGNOSIS — E785 Hyperlipidemia, unspecified: Secondary | ICD-10-CM | POA: Diagnosis not present

## 2021-01-02 DIAGNOSIS — G629 Polyneuropathy, unspecified: Secondary | ICD-10-CM | POA: Diagnosis not present

## 2021-01-02 MED ORDER — TIZANIDINE HCL 2 MG PO TABS
ORAL_TABLET | ORAL | 1 refills | Status: DC
Start: 2021-01-02 — End: 2021-03-01

## 2021-01-02 MED ORDER — BENZONATATE 200 MG PO CAPS: 200.0000 mg | ORAL_CAPSULE | Freq: Two times a day (BID) | ORAL | 0 refills | Status: DC | PRN

## 2021-01-02 NOTE — Progress Notes (Signed)
Virtual Visit via telephone Note  This visit type was conducted due to national recommendations for restrictions regarding the COVID-19 pandemic (e.g. social distancing).  This format is felt to be most appropriate for this patient at this time.  All issues noted in this document were discussed and addressed.  No physical exam was performed (except for noted visual exam findings with Video Visits).   I connected with Maria Hamilton today at  9:00 AM EST by telephone and verified that I am speaking with the correct person using two identifiers. Location patient: home Location provider: home office Persons participating in the virtual visit: patient, provider  I discussed the limitations, risks, security and privacy concerns of performing an evaluation and management service by telephone and the availability of in person appointments. I also discussed with the patient that there may be a patient responsible charge related to this service. The patient expressed understanding and agreed to proceed.  Interactive audio and video telecommunications were attempted between this provider and patient, however failed, due to patient having technical difficulties OR patient did not have access to video capability.  We continued and completed visit with audio only.   Reason for visit: f/u  HPI: HYPERTENSION  Disease Monitoring  Home BP Monitoring not checking Chest pain- no    Dyspnea- no Medications  Compliance-  Taking spironolactone, amlodipine, coreg, her spironolactone and amlodipine were held in the hospital though she restarted at discharge. Lightheadedness-  no    HYPERLIPIDEMIA Symptoms Chest pain on exertion:  no    Medications: Compliance- taking crestor Right upper quadrant pain- no  Muscle aches- no  Neuropathy: Patient notes she could not tolerate the new dosing of her gabapentin.  Notes it made her feel sick at night.  She is back on her 600 mg capsule and has been doing well.   Notes that this in addition to Tylenol PM at night helps with the neuropathy symptoms.  COVID-19: Patient was diagnosed with this in December.  She was admitted to the hospital given generalized weakness.  She was not hypoxic during the hospitalization.  She completed a 5-day course of remdesivir.  Notes her throat is still somewhat sore.  She does still have some fatigue.  She notes no shortness of breath.  Her taste is slowly returning.  She does still have some mild cough that is much improved.  She notes overall her symptoms are quite a bit improved.   ROS: See pertinent positives and negatives per HPI.  Past Medical History:  Diagnosis Date  . Acute anxiety 08/03/2015   Overview:  Last Assessment & Plan:  Klonopin most helpful  Reports past taking it 1 tab am 1/2 tab midday and 1 tab pm  30 day supply faxed to pharmacy  . Acute bronchitis 12/31/2016  . Acute pancreatitis 10/14/2016  . Altered mental status 10/12/2016  . Anxiety   . Bronchitis   . Chronic hip pain   . Chronic pain syndrome   . Depression   . Edema   . Fatigue   . Fibromyalgia   . GERD (gastroesophageal reflux disease)   . Headache   . Hypertension   . Insomnia   . Low back pain   . Right upper quadrant abdominal pain   . Vitamin D deficiency     Past Surgical History:  Procedure Laterality Date  . ABDOMINAL HYSTERECTOMY    . BACK SURGERY    . BREAST BIOPSY Left 90s   benign  . BREAST SURGERY    .  CHOLECYSTECTOMY N/A 10/16/2016   Procedure: LAPAROSCOPIC CHOLECYSTECTOMY WITH INTRAOPERATIVE CHOLANGIOGRAM;  Surgeon: Dia Crawford III, MD;  Location: [facility] ORS;  Service: General;  Laterality: N/A;  . cyst removal from wrist    . ECTOPIC PREGNANCY SURGERY    . HIP SURGERY     x4  . OOPHORECTOMY      Family History  Problem Relation Age of Onset  . Heart disease Mother   . Cancer Father   . Alcohol abuse Father     SOCIAL HX: Non-smoker   Current Outpatient Medications:  .  albuterol (VENTOLIN HFA) 108  (90 Base) MCG/ACT inhaler, Inhale 2 puffs into the lungs every 6 (six) hours as needed for wheezing or shortness of breath., Disp: 8 g, Rfl: 2 .  benzonatate (TESSALON) 200 MG capsule, Take 1 capsule (200 mg total) by mouth 2 (two) times daily as needed for cough., Disp: 20 capsule, Rfl: 0 .  calcium carbonate (OSCAL) 1500 (600 Ca) MG TABS tablet, Take 600 mg of elemental calcium by mouth daily with breakfast., Disp: , Rfl:  .  carvedilol (COREG) 12.5 MG tablet, Take 1 tablet (12.5 mg total) by mouth 2 (two) times daily with a meal., Disp: 180 tablet, Rfl: 1 .  cholecalciferol (VITAMIN D) 1000 units tablet, Take 1,000 Units by mouth daily., Disp: , Rfl:  .  escitalopram (LEXAPRO) 10 MG tablet, TAKE 1 TABLET EVERY DAY, Disp: 90 tablet, Rfl: 1 .  gabapentin (NEURONTIN) 600 MG tablet, Take 600 mg by mouth at bedtime., Disp: , Rfl:  .  guaiFENesin-dextromethorphan (ROBITUSSIN DM) 100-10 MG/5ML syrup, Take 5 mLs by mouth every 4 (four) hours as needed for cough., Disp: 118 mL, Rfl: 0 .  Magnesium 250 MG TABS, Take by mouth at bedtime., Disp: , Rfl:  .  potassium chloride SA (KLOR-CON) 20 MEQ tablet, TAKE 1 TABLET EVERY DAY, Disp: 90 tablet, Rfl: 1 .  rosuvastatin (CRESTOR) 20 MG tablet, TAKE 1 TABLET EVERY DAY, Disp: 90 tablet, Rfl: 3 .  triamcinolone ointment (KENALOG) 0.5 %, Apply 1 application topically 2 (two) times daily. Prn Left neck and right inner arm x1-2 weeks prn, Disp: 60 g, Rfl: 0 .  vitamin C (ASCORBIC ACID) 250 MG tablet, Take 2 tablets (500 mg total) by mouth daily., Disp: 60 tablet, Rfl: 0 .  Zinc 220 (50 Zn) MG CAPS, Take 220 mg by mouth daily., Disp: 30 capsule, Rfl: 0 .  tiZANidine (ZANAFLEX) 2 MG tablet, TAKE 2 TABLETS(4 MG) BY MOUTH AT BEDTIME AS NEEDED FOR MUSCLE SPASMS, Disp: 30 tablet, Rfl: 1  EXAM: This was a telephone visit and thus no exam was completed  ASSESSMENT AND PLAN:  Discussed the following assessment and plan:  Problem List Items Addressed This Visit     History of COVID-19    Patient is progressively recovering.  Discussed monitoring for continued improvement.  Will provide with Tessalon to treat her cough.  She will contact us if she has any persistent or worsening symptoms.      Relevant Medications   benzonatate (TESSALON) 200 MG capsule   Hyperlipidemia    Continue Crestor 20 mg once daily.      Hypertension    Patient has restarted her home doses of spironolactone, amlodipine, and carvedilol.  She has had no lightheadedness.  We will plan to have her into the office in the next couple of months for recheck of blood pressure.  She will monitor for lightheadedness and if this occurs she will let us know.  Polyneuropathy    Patient was unable to tolerate the increased dose of gabapentin.  Lyrica has been too expensive in the past.  She will continue with gabapentin 600 mg once nightly.  She can also continue over-the-counter Tylenol PM though if she develops excessive drowsiness she will let us know.      Relevant Medications   tiZANidine (ZANAFLEX) 2 MG tablet       I discussed the assessment and treatment plan with the patient. The patient was provided an opportunity to ask questions and all were answered. The patient agreed with the plan and demonstrated an understanding of the instructions.   The patient was advised to call back or seek an in-person evaluation if the symptoms worsen or if the condition fails to improve as anticipated.  I provided 11 minutes of non-face-to-face time during this encounter.   [name], MD

## 2021-01-02 NOTE — Assessment & Plan Note (Signed)
Patient is progressively recovering.  Discussed monitoring for continued improvement.  Will provide with Tessalon to treat her cough.  She will contact us if she has any persistent or worsening symptoms.

## 2021-01-02 NOTE — Assessment & Plan Note (Signed)
Patient was unable to tolerate the increased dose of gabapentin.  Lyrica has been too expensive in the past.  She will continue with gabapentin 600 mg once nightly.  She can also continue over-the-counter Tylenol PM though if she develops excessive drowsiness she will let us know.

## 2021-01-02 NOTE — Assessment & Plan Note (Signed)
Patient has restarted her home doses of spironolactone, amlodipine, and carvedilol.  She has had no lightheadedness.  We will plan to have her into the office in the next couple of months for recheck of blood pressure.  She will monitor for lightheadedness and if this occurs she will let us know.

## 2021-01-02 NOTE — Assessment & Plan Note (Signed)
Continue Crestor 20 mg once daily. ?

## 2021-01-25 ENCOUNTER — Ambulatory Visit (INDEPENDENT_AMBULATORY_CARE_PROVIDER_SITE_OTHER): Payer: Medicare HMO

## 2021-01-25 VITALS — Ht 72.0 in | Wt 201.0 lb

## 2021-01-25 DIAGNOSIS — Z780 Asymptomatic menopausal state: Secondary | ICD-10-CM

## 2021-01-25 DIAGNOSIS — Z0000 Encounter for general adult medical examination without abnormal findings: Secondary | ICD-10-CM | POA: Diagnosis not present

## 2021-01-25 NOTE — Patient Instructions (Addendum)
Maria Hamilton , Thank you for taking time to come for your Medicare Wellness Visit. I appreciate your ongoing commitment to your health goals. Please review the following plan we discussed and let me know if I can assist you in the future.   These are the goals we discussed: Goals    . Prevent falls     Do not stand on chairs when reaching for objects.  Pace self when walking.         This is a list of the screening recommended for you and due dates:  Health Maintenance  Topic Date Due  . DEXA scan (bone density measurement)  Never done  . Mammogram  05/06/2020  . Colon Cancer Screening  05/08/2020  . COVID-19 Vaccine (1) 02/10/2021*  . Flu Shot  03/16/2021*  . Pneumonia vaccines (2 of 2 - PCV13) 01/25/2022*  . Tetanus Vaccine  06/10/2022  .  Hepatitis C: One time screening is recommended by Center for Disease Control  (CDC) for  adults born from [birth year] through 1965.   Completed  *Topic was postponed. The date shown is not the original due date.    Immunizations Immunization History  Administered Date(s) Administered  . Influenza, High Dose Seasonal PF 10/16/2017  . Influenza,inj,Quad PF,6+ Mos 10/17/2016  . Influenza-Unspecified 10/29/2012  . Pneumococcal Polysaccharide-23 10/17/2016  . Td 09/09/2001  . Tdap 06/10/2012    Advanced directives: not yet completed  Conditions/risks identified: none new  Follow up in one year for your annual wellness visit    Preventive Care 65 Years and Older, Female Preventive care refers to lifestyle choices and visits with your health care provider that can promote health and wellness. What does preventive care include?  A yearly physical exam. This is also called an annual well check.  Dental exams once or twice a year.  Routine eye exams. Ask your health care provider how often you should have your eyes checked.  Personal lifestyle choices, including:  Daily care of your teeth and gums.  Regular physical  activity.  Eating a healthy diet.  Avoiding tobacco and drug use.  Limiting alcohol use.  Practicing safe sex.  Taking low-dose aspirin every day.  Taking vitamin and mineral supplements as recommended by your health care provider. What happens during an annual well check? The services and screenings done by your health care provider during your annual well check will depend on your age, overall health, lifestyle risk factors, and family history of disease. Counseling  Your health care provider may ask you questions about your:  Alcohol use.  Tobacco use.  Drug use.  Emotional well-being.  Home and relationship well-being.  Sexual activity.  Eating habits.  History of falls.  Memory and ability to understand (cognition).  Work and work Statistician.  Reproductive health. Screening  You may have the following tests or measurements:  Height, weight, and BMI.  Blood pressure.  Lipid and cholesterol levels. These may be checked every 5 years, or more frequently if you are over 54 years old.  Skin check.  Lung cancer screening. You may have this screening every year starting at age 20 if you have a 30-pack-year history of smoking and currently smoke or have quit within the past 15 years.  Fecal occult blood test (FOBT) of the stool. You may have this test every year starting at age 34.  Flexible sigmoidoscopy or colonoscopy. You may have a sigmoidoscopy every 5 years or a colonoscopy every 10 years starting at age 49.  Hepatitis C blood test.  Hepatitis B blood test.  Sexually transmitted disease (STD) testing.  Diabetes screening. This is done by checking your blood sugar (glucose) after you have not eaten for a while (fasting). You may have this done every 1-3 years.  Bone density scan. This is done to screen for osteoporosis. You may have this done starting at age 47.  Mammogram. This may be done every 1-2 years. Talk to your health care provider about  how often you should have regular mammograms. Talk with your health care provider about your test results, treatment options, and if necessary, the need for more tests. Vaccines  Your health care provider may recommend certain vaccines, such as:  Influenza vaccine. This is recommended every year.  Tetanus, diphtheria, and acellular pertussis (Tdap, Td) vaccine. You may need a Td booster every 10 years.  Zoster vaccine. You may need this after age 57.  Pneumococcal 13-valent conjugate (PCV13) vaccine. One dose is recommended after age 81.  Pneumococcal polysaccharide (PPSV23) vaccine. One dose is recommended after age 73. Talk to your health care provider about which screenings and vaccines you need and how often you need them. This information is not intended to replace advice given to you by your health care provider. Make sure you discuss any questions you have with your health care provider. Document Released: 12/30/2015 Document Revised: 08/22/2016 Document Reviewed: 10/04/2015 Elsevier Interactive Patient Education  2017 [place] Prevention in the Home Falls can cause injuries. They can happen to people of all ages. There are many things you can do to make your home safe and to help prevent falls. What can I do on the outside of my home?  Regularly fix the edges of walkways and driveways and fix any cracks.  Remove anything that might make you trip as you walk through a door, such as a raised step or threshold.  Trim any bushes or trees on the path to your home.  Use bright outdoor lighting.  Clear any walking paths of anything that might make someone trip, such as rocks or tools.  Regularly check to see if handrails are loose or broken. Make sure that both sides of any steps have handrails.  Any raised decks and porches should have guardrails on the edges.  Have any leaves, snow, or ice cleared regularly.  Use sand or salt on walking paths during  winter.  Clean up any spills in your garage right away. This includes oil or grease spills. What can I do in the bathroom?  Use night lights.  Install grab bars by the toilet and in the tub and shower. Do not use towel bars as grab bars.  Use non-skid mats or decals in the tub or shower.  If you need to sit down in the shower, use a plastic, non-slip stool.  Keep the floor dry. Clean up any water that spills on the floor as soon as it happens.  Remove soap buildup in the tub or shower regularly.  Attach bath mats securely with double-sided non-slip rug tape.  Do not have throw rugs and other things on the floor that can make you trip. What can I do in the bedroom?  Use night lights.  Make sure that you have a light by your bed that is easy to reach.  Do not use any sheets or blankets that are too big for your bed. They should not hang down onto the floor.  Have a firm chair that has side  arms. You can use this for support while you get dressed.  Do not have throw rugs and other things on the floor that can make you trip. What can I do in the kitchen?  Clean up any spills right away.  Avoid walking on wet floors.  Keep items that you use a lot in easy-to-reach places.  If you need to reach something above you, use a strong step stool that has a grab bar.  Keep electrical cords out of the way.  Do not use floor polish or wax that makes floors slippery. If you must use wax, use non-skid floor wax.  Do not have throw rugs and other things on the floor that can make you trip. What can I do with my stairs?  Do not leave any items on the stairs.  Make sure that there are handrails on both sides of the stairs and use them. Fix handrails that are broken or loose. Make sure that handrails are as long as the stairways.  Check any carpeting to make sure that it is firmly attached to the stairs. Fix any carpet that is loose or worn.  Avoid having throw rugs at the top or  bottom of the stairs. If you do have throw rugs, attach them to the floor with carpet tape.  Make sure that you have a light switch at the top of the stairs and the bottom of the stairs. If you do not have them, ask someone to add them for you. What else can I do to help prevent falls?  Wear shoes that:  Do not have high heels.  Have rubber bottoms.  Are comfortable and fit you well.  Are closed at the toe. Do not wear sandals.  If you use a stepladder:  Make sure that it is fully opened. Do not climb a closed stepladder.  Make sure that both sides of the stepladder are locked into place.  Ask someone to hold it for you, if possible.  Clearly mark and make sure that you can see:  Any grab bars or handrails.  First and last steps.  Where the edge of each step is.  Use tools that help you move around (mobility aids) if they are needed. These include:  Canes.  Walkers.  Scooters.  Crutches.  Turn on the lights when you go into a dark area. Replace any light bulbs as soon as they burn out.  Set up your furniture so you have a clear path. Avoid moving your furniture around.  If any of your floors are uneven, fix them.  If there are any pets around you, be aware of where they are.  Review your medicines with your doctor. Some medicines can make you feel dizzy. This can increase your chance of falling. Ask your doctor what other things that you can do to help prevent falls. This information is not intended to replace advice given to you by your health care provider. Make sure you discuss any questions you have with your health care provider. Document Released: 09/29/2009 Document Revised: 05/10/2016 Document Reviewed: 01/07/2015 Elsevier Interactive Patient Education  2017 [employer] Bone Density Test A bone density test uses a type of X-ray to measure the amount of calcium and other minerals in a person's bones. It can measure bone density in the hip and the  spine. The test is similar to having a regular X-ray. This test may also be called:  Bone densitometry.  Bone mineral density test.  Dual-energy X-ray  absorptiometry (DEXA). You may have this test to:  Diagnose a condition that causes weak or thin bones (osteoporosis).  Screen you for osteoporosis.  Predict your risk for a broken bone (fracture).  Determine how well your osteoporosis treatment is working. Tell a health care provider about:  Any allergies you have.  All medicines you are taking, including vitamins, herbs, eye drops, creams, and over-the-counter medicines.  Any problems you or family members have had with anesthetic medicines.  Any blood disorders you have.  Any surgeries you have had.  Any medical conditions you have.  Whether you are pregnant or may be pregnant.  Any medical tests you have had within the past 14 days that used contrast material. What are the risks? Generally, this is a safe test. However, it does expose you to a small amount of radiation, which can slightly increase your cancer risk. What happens before the test?  Do not take any calcium supplements within the 24 hours before your test.  You will need to remove all metal jewelry, eyeglasses, removable dental appliances, and any other metal objects on your body. What happens during the test?  You will lie down on an exam table. There will be an X-ray generator below you and an imaging device above you.  Other devices, such as boxes or braces, may be used to position your body properly for the scan.  The machine will slowly scan your body. You will need to keep very still while the machine does the scan.  The images will show up on a screen in the room. Images will be examined by a specialist after your test is finished. The procedure may vary among health care providers and hospitals.   What can I expect after the test? It is up to you to get the results of your test. Ask your health  care provider, or the department that is doing the test, when your results will be ready. Summary  A bone density test is an imaging test that uses a type of X-ray to measure the amount of calcium and other minerals in your bones.  The test may be used to diagnose or screen you for a condition that causes weak or thin bones (osteoporosis), predict your risk for a broken bone (fracture), or determine how well your osteoporosis treatment is working.  Do not take any calcium supplements within 24 hours before your test.  Ask your health care provider, or the department that is doing the test, when your results will be ready. This information is not intended to replace advice given to you by your health care provider. Make sure you discuss any questions you have with your health care provider. Document Revised: 05/19/2020 Document Reviewed: 05/19/2020 Elsevier Patient Education  [place].

## 2021-01-25 NOTE — Progress Notes (Signed)
Subjective:   Maria Hamilton is a 70 y.o. female who presents for Medicare Annual (Subsequent) preventive examination.  Review of Systems    No ROS.  Medicare Wellness Virtual Visit.   Cardiac Risk Factors include: advanced age (>53men, >46 women);hypertension     Objective:    Today's Vitals   01/25/21 1237  Weight: 201 lb (91.2 kg)  Height: 6' (1.829 m)   Body mass index is 27.26 kg/m.  Advanced Directives 01/25/2021 12/12/2020 12/12/2020 01/25/2020 07/10/2018 02/17/2018 01/02/2018  Does Patient Have a Medical Advance Directive? No No No Yes No No No  Type of Advance Directive - - - Living will;Healthcare Power of Attorney - - -  Does patient want to make changes to medical advance directive? - - - No - Patient declined - - -  Copy of [place] in Chart? - - - No - copy requested - - -  Would patient like information on creating a medical advance directive? No - Patient declined No - Patient declined No - Patient declined - No - Patient declined No - Patient declined No - Patient declined    Current Medications (verified) Outpatient Encounter Medications as of 01/25/2021  Medication Sig  . albuterol (VENTOLIN HFA) 108 (90 Base) MCG/ACT inhaler Inhale 2 puffs into the lungs every 6 (six) hours as needed for wheezing or shortness of breath.  . benzonatate (TESSALON) 200 MG capsule Take 1 capsule (200 mg total) by mouth 2 (two) times daily as needed for cough.  . calcium carbonate (OSCAL) 1500 (600 Ca) MG TABS tablet Take 600 mg of elemental calcium by mouth daily with breakfast.  . carvedilol (COREG) 12.5 MG tablet Take 1 tablet (12.5 mg total) by mouth 2 (two) times daily with a meal.  . cholecalciferol (VITAMIN D) 1000 units tablet Take 1,000 Units by mouth daily.  Marland Kitchen escitalopram (LEXAPRO) 10 MG tablet TAKE 1 TABLET EVERY DAY  . gabapentin (NEURONTIN) 600 MG tablet Take 600 mg by mouth at bedtime.  Marland Kitchen guaiFENesin-dextromethorphan (ROBITUSSIN DM) 100-10  MG/5ML syrup Take 5 mLs by mouth every 4 (four) hours as needed for cough. (Patient not taking: Reported on 01/25/2021)  . Magnesium 250 MG TABS Take by mouth at bedtime.  . potassium chloride SA (KLOR-CON) 20 MEQ tablet TAKE 1 TABLET EVERY DAY  . rosuvastatin (CRESTOR) 20 MG tablet TAKE 1 TABLET EVERY DAY  . tiZANidine (ZANAFLEX) 2 MG tablet TAKE 2 TABLETS(4 MG) BY MOUTH AT BEDTIME AS NEEDED FOR MUSCLE SPASMS  . triamcinolone ointment (KENALOG) 0.5 % Apply 1 application topically 2 (two) times daily. Prn Left neck and right inner arm x1-2 weeks prn  . Zinc 220 (50 Zn) MG CAPS Take 220 mg by mouth daily.   No facility-administered encounter medications on file as of 01/25/2021.    Allergies (verified) Ace inhibitors, Ciprofloxacin, Fentanyl, Hctz [hydrochlorothiazide], Latex, Morphine and related, Penicillin g benzathine, Isovue m [iopamidol], and Tape   History: Past Medical History:  Diagnosis Date  . Acute anxiety 08/03/2015   Overview:  Last Assessment & Plan:  Klonopin most helpful  Reports past taking it 1 tab am 1/2 tab midday and 1 tab pm  30 day supply faxed to pharmacy  . Acute bronchitis 12/31/2016  . Acute pancreatitis 10/14/2016  . Altered mental status 10/12/2016  . Anxiety   . Bronchitis   . Chronic hip pain   . Chronic pain syndrome   . Depression   . Edema   . Fatigue   .  Fibromyalgia   . GERD (gastroesophageal reflux disease)   . Headache   . Hypertension   . Insomnia   . Low back pain   . Right upper quadrant abdominal pain   . Vitamin D deficiency    Past Surgical History:  Procedure Laterality Date  . ABDOMINAL HYSTERECTOMY    . BACK SURGERY    . BREAST BIOPSY Left 90s   benign  . BREAST SURGERY    . CHOLECYSTECTOMY N/A 10/16/2016   Procedure: LAPAROSCOPIC CHOLECYSTECTOMY WITH INTRAOPERATIVE CHOLANGIOGRAM;  Surgeon: Dia Crawford III, MD;  Location: [facility] ORS;  Service: General;  Laterality: N/A;  . cyst removal from wrist    . ECTOPIC PREGNANCY SURGERY    .  HIP SURGERY     x4  . OOPHORECTOMY     Family History  Problem Relation Age of Onset  . Heart disease Mother   . Cancer Father   . Alcohol abuse Father    Social History   Socioeconomic History  . Marital status: Married    Spouse name: Not on file  . Number of children: Not on file  . Years of education: Not on file  . Highest education level: Not on file  Occupational History  . Not on file  Tobacco Use  . Smoking status: Never Smoker  . Smokeless tobacco: Never Used  Substance and Sexual Activity  . Alcohol use: No    Alcohol/week: 0.0 standard drinks  . Drug use: No  . Sexual activity: Yes  Other Topics Concern  . Not on file  Social History Narrative  . Not on file   Social Determinants of Health   Financial Resource Strain: Low Risk   . Difficulty of Paying Living Expenses: Not hard at all  Food Insecurity: No Food Insecurity  . Worried About Charity fundraiser in the Last Year: Never true  . Ran Out of Food in the Last Year: Never true  Transportation Needs: No Transportation Needs  . Lack of Transportation (Medical): No  . Lack of Transportation (Non-Medical): No  Physical Activity: Not on file  Stress: No Stress Concern Present  . Feeling of Stress : Not at all  Social Connections: Unknown  . Frequency of Communication with Friends and Family: Not on file  . Frequency of Social Gatherings with Friends and Family: Not on file  . Attends Religious Services: Not on file  . Active Member of Clubs or Organizations: Not on file  . Attends Archivist Meetings: Not on file  . Marital Status: Married    Tobacco Counseling Counseling given: Not Answered   Clinical Intake:  Pre-visit preparation completed: Yes        Diabetes: No  How often do you need to have someone help you when you read instructions, pamphlets, or other written materials from your doctor or pharmacy?: 1 - Never   Interpreter Needed?: No      Activities of Daily  Living In your present state of health, do you have any difficulty performing the following activities: 01/25/2021 12/12/2020  Hearing? N N  Vision? N N  Difficulty concentrating or making decisions? N N  Walking or climbing stairs? N N  Dressing or bathing? N N  Doing errands, shopping? N N  Preparing Food and eating ? N -  Using the Toilet? N -  In the past six months, have you accidently leaked urine? N -  Do you have problems with loss of bowel control? N -  Managing  your Medications? N -  Managing your Finances? N -  Housekeeping or managing your Housekeeping? N -  Some recent data might be hidden    Patient Care Team: Leone Haven, MD as PCP - General (Family Medicine)  Indicate any recent Medical Services you may have received from other than [facility] providers in the past year (date may be approximate).     Assessment:   This is a routine wellness examination for [place].  I connected with Fleda today by telephone and verified that I am speaking with the correct person using two identifiers. Location patient: home Location provider: work Persons participating in the virtual visit: patient, Marine scientist.    I discussed the limitations, risks, security and privacy concerns of performing an evaluation and management service by telephone and the availability of in person appointments. The patient expressed understanding and verbally consented to this telephonic visit.    Interactive audio and video telecommunications were attempted between this provider and patient, however failed, due to patient having technical difficulties OR patient did not have access to video capability.  We continued and completed visit with audio only.  Some vital signs may be absent or patient reported.   Hearing/Vision screen  Hearing Screening   125Hz  250Hz  500Hz  1000Hz  2000Hz  3000Hz  4000Hz  6000Hz  8000Hz   Right ear:           Left ear:           Comments: Patient is able to hear conversational  tones without difficulty.  No issues reported.  Vision Screening Comments: Wears corrective lenses Visual acuity not assessed, virtual visit. They have seen their ophthalmologist.   Dietary issues and exercise activities discussed: Current Exercise Habits: The patient does not participate in regular exercise at present   Regular diet Good water intake  Goals    . Prevent falls     Do not stand on chairs when reaching for objects.  Pace self when walking.        Depression Screen PHQ 2/9 Scores 01/25/2021 01/02/2021 05/02/2020 01/25/2020 01/12/2020 08/07/2019 11/19/2018  PHQ - 2 Score 0 0 0 0 0 0 2  Exception Documentation - - - - - - Medical reason    Fall Risk Fall Risk  10/31/2020 07/20/2020 05/02/2020 01/25/2020 01/12/2020  Falls in the past year? 0 1 1 1 1   Number falls in past yr: 0 1 1 1 1   Injury with Fall? - 0 0 0 0  Comment - - - Slipped when climbing down from a chair 1 week ago. No injury, some soreness. -  Risk for fall due to : - - - - History of fall(s)  Follow up Falls evaluation completed Falls evaluation completed Falls evaluation completed Falls evaluation completed;Education provided -    FALL RISK PREVENTION PERTAINING TO THE HOME: Handrails in use when climbing stairs? Yes Home free of loose throw rugs in walkways, pet beds, electrical cords, etc? Yes  Adequate lighting in your home to reduce risk of falls? Yes   ASSISTIVE DEVICES UTILIZED TO PREVENT FALLS: Use of a cane, walker or w/c? No   TIMED UP AND GO: Was the test performed? No . Virtual visit.   Cognitive Function:     6CIT Screen 01/25/2021 01/25/2020  What Year? 0 points 0 points  What month? 0 points 0 points  What time? 0 points 0 points  Count back from 20 0 points 0 points  Months in reverse 0 points 2 points    Immunizations Immunization History  Administered Date(s) Administered  . Influenza, High Dose Seasonal PF 10/16/2017  . Influenza,inj,Quad PF,6+ Mos 10/17/2016  .  Influenza-Unspecified 10/29/2012  . Pneumococcal Polysaccharide-23 10/17/2016  . Td 09/09/2001  . Tdap 06/10/2012   Health Maintenance Health Maintenance  Topic Date Due  . DEXA SCAN  Never done  . MAMMOGRAM  05/06/2020  . COLONOSCOPY (Pts 45-3yrs Insurance coverage will need to be confirmed)  05/08/2020  . COVID-19 Vaccine (1) 02/10/2021 (Originally 04/07/1956)  . INFLUENZA VACCINE  03/16/2021 (Originally 07/17/2020)  . PNA vac Low Risk Adult (2 of 2 - PCV13) 01/25/2022 (Originally 10/17/2017)  . TETANUS/TDAP  06/10/2022  . Hepatitis C Screening  Completed   Colonoscopy- deferred per patient preference.   Mammogram- received phone call reminder. Plans to schedule.   Bone density- plans to schedule with Mammogram. Order submitted.   Lung Cancer Screening: (Low Dose CT Chest recommended if Age 65-80 years, 30 pack-year currently smoking OR have quit w/in 15years.) does not qualify.   Hepatitis C Screening:  Completed 02/05/20.   Vision Screening: Recommended annual ophthalmology exams for early detection of glaucoma and other disorders of the eye. Is the patient up to date with their annual eye exam?  Yes   Dental Screening: Recommended annual dental exams for proper oral hygiene.  Community Resource Referral / Chronic Care Management: CRR required this visit?  No   CCM required this visit?  No      Plan:   Keep all routine maintenance appointments.   Follow up 03/07/21 @ 11:00  I have personally reviewed and noted the following in the patient's chart:   . Medical and social history . Use of alcohol, tobacco or illicit drugs  . Current medications and supplements . Functional ability and status . Nutritional status . Physical activity . Advanced directives . List of other physicians . Hospitalizations, surgeries, and ER visits in previous 12 months . Vitals . Screenings to include cognitive, depression, and falls . Referrals and appointments  In addition, I have  reviewed and discussed with patient certain preventive protocols, quality metrics, and best practice recommendations. A written personalized care plan for preventive services as well as general preventive health recommendations were provided to patient via mychart.     Varney Biles, LPN   04/19/85

## 2021-01-30 ENCOUNTER — Encounter: Payer: Self-pay | Admitting: Family Medicine

## 2021-02-27 ENCOUNTER — Other Ambulatory Visit: Payer: Self-pay | Admitting: Family Medicine

## 2021-02-28 NOTE — Telephone Encounter (Signed)
Has upcoming appt 03/07/21 ok to fill?

## 2021-03-03 ENCOUNTER — Other Ambulatory Visit: Payer: Self-pay

## 2021-03-07 ENCOUNTER — Ambulatory Visit (INDEPENDENT_AMBULATORY_CARE_PROVIDER_SITE_OTHER): Payer: Medicare HMO | Admitting: Family Medicine

## 2021-03-07 ENCOUNTER — Other Ambulatory Visit: Payer: Self-pay

## 2021-03-07 ENCOUNTER — Ambulatory Visit (INDEPENDENT_AMBULATORY_CARE_PROVIDER_SITE_OTHER): Payer: Medicare HMO

## 2021-03-07 ENCOUNTER — Encounter: Payer: Self-pay | Admitting: Family Medicine

## 2021-03-07 DIAGNOSIS — F32A Depression, unspecified: Secondary | ICD-10-CM

## 2021-03-07 DIAGNOSIS — I10 Essential (primary) hypertension: Secondary | ICD-10-CM

## 2021-03-07 DIAGNOSIS — L659 Nonscarring hair loss, unspecified: Secondary | ICD-10-CM | POA: Diagnosis not present

## 2021-03-07 DIAGNOSIS — M25562 Pain in left knee: Secondary | ICD-10-CM | POA: Diagnosis not present

## 2021-03-07 DIAGNOSIS — F419 Anxiety disorder, unspecified: Secondary | ICD-10-CM | POA: Diagnosis not present

## 2021-03-07 LAB — TSH: TSH: 1.16 u[IU]/mL (ref 0.35–4.50)

## 2021-03-07 MED ORDER — CARVEDILOL 12.5 MG PO TABS: 12.5000 mg | ORAL_TABLET | Freq: Two times a day (BID) | ORAL | 1 refills | Status: DC

## 2021-03-07 MED ORDER — ESCITALOPRAM OXALATE 20 MG PO TABS: 20.0000 mg | ORAL_TABLET | Freq: Every day | ORAL | 1 refills | Status: DC

## 2021-03-07 NOTE — Assessment & Plan Note (Addendum)
Adequate control.  Continue spironolactone 12.5 mg once daily, amlodipine 10 mg daily, and carvedilol 12.5 mg twice daily.

## 2021-03-07 NOTE — Assessment & Plan Note (Signed)
Check TSH.  Consider referral to dermatology if TSH is normal.

## 2021-03-07 NOTE — Assessment & Plan Note (Signed)
Possibly osteoarthritis related given lack of injury.  We will get an x-ray today.

## 2021-03-07 NOTE — Assessment & Plan Note (Signed)
Continues to have issues with depression.  We will increase her Lexapro to 20 mg once daily.

## 2021-03-07 NOTE — Patient Instructions (Signed)
Nice to see you. We will contact you with your x-ray result and lab result. We are increasing your Lexapro 20 mg once daily. Please consider getting a mammogram and colonoscopy.

## 2021-03-07 NOTE — Progress Notes (Signed)
[name], MD Phone: [phone]  Maria Hamilton is a 70 y.o. female who presents today for f/u.  HYPERTENSION  Disease Monitoring  Home BP Monitoring not checking Chest pain- no    Dyspnea- no Medications  Compliance-  Taking spironolactone, coreg though has been out of coreg.   Edema- no  Anxiety/depression: Patient does note she stays depressed.  She does not feel as though the Lexapro has been beneficial.  No SI.  Left knee pain: This has been going on 2 weeks.  Started with the weather changes.  No injury.  It does pop.  Occasionally locks.  Does not give out on her.  She takes 2 Tylenol PM at night to help with the pain.  No prior imaging.  Hair loss: This is been an ongoing issue for many months.  Started before she had COVID-19.  Has not gotten worse since she had COVID-19.  Some itchy dry scalp.    Social History   Tobacco Use  Smoking Status Never Smoker  Smokeless Tobacco Never Used    Current Outpatient Medications on File Prior to Visit  Medication Sig Dispense Refill  . albuterol (VENTOLIN HFA) 108 (90 Base) MCG/ACT inhaler Inhale 2 puffs into the lungs every 6 (six) hours as needed for wheezing or shortness of breath. 8 g 2  . amLODipine (NORVASC) 10 MG tablet Take 10 mg by mouth daily.    . benzonatate (TESSALON) 200 MG capsule Take 1 capsule (200 mg total) by mouth 2 (two) times daily as needed for cough. 20 capsule 0  . calcium carbonate (OSCAL) 1500 (600 Ca) MG TABS tablet Take 600 mg of elemental calcium by mouth daily with breakfast.    . cholecalciferol (VITAMIN D) 1000 units tablet Take 1,000 Units by mouth daily.    Marland Kitchen gabapentin (NEURONTIN) 600 MG tablet Take 600 mg by mouth at bedtime.    Marland Kitchen guaiFENesin-dextromethorphan (ROBITUSSIN DM) 100-10 MG/5ML syrup Take 5 mLs by mouth every 4 (four) hours as needed for cough. 118 mL 0  . potassium chloride SA (KLOR-CON) 20 MEQ tablet TAKE 1 TABLET EVERY DAY 90 tablet 1  . rosuvastatin (CRESTOR) 20  MG tablet TAKE 1 TABLET EVERY DAY 90 tablet 3  . spironolactone (ALDACTONE) 25 MG tablet Take 12.5 mg by mouth daily.    Marland Kitchen tiZANidine (ZANAFLEX) 2 MG tablet TAKE 2 TABLETS(4 MG) BY MOUTH AT BEDTIME AS NEEDED FOR MUSCLE SPASMS 30 tablet 1  . triamcinolone ointment (KENALOG) 0.5 % Apply 1 application topically 2 (two) times daily. Prn Left neck and right inner arm x1-2 weeks prn 60 g 0  . Zinc 220 (50 Zn) MG CAPS Take 220 mg by mouth daily. 30 capsule 0   No current facility-administered medications on file prior to visit.     ROS see history of present illness  Objective  Physical Exam Vitals:   03/07/21 1120  BP: 120/80  Pulse: 69  Temp: 98.4 F (36.9 C)  SpO2: 99%    BP Readings from Last 3 Encounters:  03/07/21 120/80  12/16/20 116/68  06/19/20 (!) 163/90   Wt Readings from Last 3 Encounters:  03/07/21 198 lb 9.6 oz (90.1 kg)  01/25/21 201 lb (91.2 kg)  01/02/21 201 lb (91.2 kg)    Physical Exam Constitutional:      General: She is not in acute distress.    Appearance: She is not diaphoretic.  Cardiovascular:     Rate and Rhythm: Normal rate and regular rhythm.  Heart sounds: Normal heart sounds.  Pulmonary:     Effort: Pulmonary effort is normal.     Breath sounds: Normal breath sounds.  Musculoskeletal:     Right lower leg: No edema.     Left lower leg: No edema.     Comments: Left knee with possible effusion over the medial joint line, no tenderness, no warmth, no erythema, negative McMurray's  Skin:    General: Skin is warm and dry.  Neurological:     Mental Status: She is alert.      Assessment/Plan: Please see individual problem list.  Problem List Items Addressed This Visit    Anxiety and depression    Continues to have issues with depression.  We will increase her Lexapro to 20 mg once daily.      Relevant Medications   escitalopram (LEXAPRO) 20 MG tablet   Hair loss    Check TSH.  Consider referral to dermatology if TSH is normal.       Relevant Orders   TSH   Hypertension    Adequate control.  Continue spironolactone 12.5 mg once daily, amlodipine 10 mg daily, and carvedilol 12.5 mg twice daily.      Relevant Medications   carvedilol (COREG) 12.5 MG tablet   amLODipine (NORVASC) 10 MG tablet   spironolactone (ALDACTONE) 25 MG tablet   Left knee pain    Possibly osteoarthritis related given lack of injury.  We will get an x-ray today.      Relevant Orders   DG Knee Complete 4 Views Left       Health Maintenance: The patient is working on scheduling a mammogram and colonoscopy as well as a bone density scan.     This visit occurred during the SARS-CoV-2 public health emergency.  Safety protocols were in place, including screening questions prior to the visit, additional usage of staff PPE, and extensive cleaning of exam room while observing appropriate contact time as indicated for disinfecting solutions.    [name], MD [facility]

## 2021-03-09 ENCOUNTER — Other Ambulatory Visit: Payer: Self-pay | Admitting: Family Medicine

## 2021-03-09 DIAGNOSIS — L659 Nonscarring hair loss, unspecified: Secondary | ICD-10-CM

## 2021-03-09 DIAGNOSIS — M25562 Pain in left knee: Secondary | ICD-10-CM

## 2021-03-10 ENCOUNTER — Other Ambulatory Visit: Payer: Self-pay | Admitting: Family Medicine

## 2021-03-10 ENCOUNTER — Telehealth: Payer: Self-pay | Admitting: Family Medicine

## 2021-03-10 NOTE — Telephone Encounter (Signed)
"  Rejection Reason - Patient Declined - Thank you for this referral. Patient was contacted and she stated she was feeling better and did not want to schedule at this time." Maria Hamilton said on Mar 09, 2021 3:13 PM  [facility]

## 2021-04-05 ENCOUNTER — Emergency Department
Admission: EM | Admit: 2021-04-05 | Discharge: 2021-04-06 | Disposition: A | Discharge status: Home / Self Care | Payer: Medicare HMO | Attending: Emergency Medicine | Admitting: Emergency Medicine

## 2021-04-05 ENCOUNTER — Other Ambulatory Visit: Payer: Self-pay

## 2021-04-05 ENCOUNTER — Encounter: Payer: Self-pay | Admitting: *Deleted

## 2021-04-05 ENCOUNTER — Emergency Department: Payer: Medicare HMO

## 2021-04-05 DIAGNOSIS — Z8719 Personal history of other diseases of the digestive system: Secondary | ICD-10-CM | POA: Diagnosis not present

## 2021-04-05 DIAGNOSIS — E876 Hypokalemia: Secondary | ICD-10-CM | POA: Insufficient documentation

## 2021-04-05 DIAGNOSIS — Z8616 Personal history of COVID-19: Secondary | ICD-10-CM | POA: Diagnosis not present

## 2021-04-05 DIAGNOSIS — R079 Chest pain, unspecified: Secondary | ICD-10-CM | POA: Insufficient documentation

## 2021-04-05 DIAGNOSIS — R1013 Epigastric pain: Secondary | ICD-10-CM | POA: Insufficient documentation

## 2021-04-05 DIAGNOSIS — Z91040 Latex allergy status: Secondary | ICD-10-CM | POA: Diagnosis not present

## 2021-04-05 DIAGNOSIS — K3189 Other diseases of stomach and duodenum: Secondary | ICD-10-CM | POA: Diagnosis not present

## 2021-04-05 DIAGNOSIS — R0789 Other chest pain: Secondary | ICD-10-CM | POA: Diagnosis not present

## 2021-04-05 DIAGNOSIS — Z79899 Other long term (current) drug therapy: Secondary | ICD-10-CM | POA: Diagnosis not present

## 2021-04-05 DIAGNOSIS — I10 Essential (primary) hypertension: Secondary | ICD-10-CM | POA: Diagnosis not present

## 2021-04-05 DIAGNOSIS — Q433 Congenital malformations of intestinal fixation: Secondary | ICD-10-CM | POA: Diagnosis not present

## 2021-04-05 DIAGNOSIS — N281 Cyst of kidney, acquired: Secondary | ICD-10-CM | POA: Diagnosis not present

## 2021-04-05 DIAGNOSIS — I700 Atherosclerosis of aorta: Secondary | ICD-10-CM | POA: Diagnosis not present

## 2021-04-05 LAB — BASIC METABOLIC PANEL
Anion gap: 8 (ref 5–15)
BUN: 8 mg/dL (ref 8–23)
CO2: 24 mmol/L (ref 22–32)
Calcium: 9 mg/dL (ref 8.9–10.3)
Chloride: 105 mmol/L (ref 98–111)
Creatinine, Ser: 0.87 mg/dL (ref 0.44–1.00)
GFR, Estimated: >60 mL/min (ref >60)
Glucose, Bld: 113 mg/dL — ABNORMAL HIGH (ref 70–99)
Potassium: 2.9 mmol/L — ABNORMAL LOW (ref 3.5–5.1)
Sodium: 137 mmol/L (ref 135–145)

## 2021-04-05 LAB — CBC
HCT: 34.6 % — ABNORMAL LOW (ref 36.0–46.0)
Hemoglobin: 11.4 g/dL — ABNORMAL LOW (ref 12.0–15.0)
MCH: 28.6 pg (ref 26.0–34.0)
MCHC: 32.9 g/dL (ref 30.0–36.0)
MCV: 86.9 fL (ref 80.0–100.0)
Platelets: 235 10*3/uL (ref 150–400)
RBC: 3.98 MIL/uL (ref 3.87–5.11)
RDW: 13.2 % (ref 11.5–15.5)
WBC: 7 10*3/uL (ref 4.0–10.5)
nRBC: 0 % (ref 0.0–0.2)

## 2021-04-05 LAB — HEPATIC FUNCTION PANEL
ALT: 10 U/L (ref 0–44)
AST: 22 U/L (ref 15–41)
Albumin: 3.6 g/dL (ref 3.5–5.0)
Alkaline Phosphatase: 66 U/L (ref 38–126)
Bilirubin, Direct: <0.1 mg/dL (ref 0.0–0.2)
Total Bilirubin: 0.6 mg/dL (ref 0.3–1.2)
Total Protein: 7.3 g/dL (ref 6.5–8.1)

## 2021-04-05 LAB — LIPASE, BLOOD: Lipase: 25 U/L (ref 11–51)

## 2021-04-05 LAB — TROPONIN I (HIGH SENSITIVITY): Troponin I (High Sensitivity): 3 ng/L (ref <18)

## 2021-04-05 MED ORDER — ALUM & MAG HYDROXIDE-SIMETH 200-200-20 MG/5ML PO SUSP
30.0000 mL | Freq: Once | ORAL | Status: AC
  Administered 2021-04-06: 30 mL via ORAL
  Filled 2021-04-05: qty 30

## 2021-04-05 MED ORDER — DICYCLOMINE HCL 10 MG PO CAPS
10.0000 mg | ORAL_CAPSULE | Freq: Once | ORAL | Status: AC
  Administered 2021-04-06: 10 mg via ORAL
  Filled 2021-04-05: qty 1

## 2021-04-05 MED ORDER — LIDOCAINE VISCOUS HCL 2 % MT SOLN
15.0000 mL | Freq: Once | OROMUCOSAL | Status: AC
  Administered 2021-04-06: 15 mL via ORAL
  Filled 2021-04-05: qty 15

## 2021-04-05 MED ORDER — KETOROLAC TROMETHAMINE 30 MG/ML IJ SOLN
15.0000 mg | Freq: Once | INTRAMUSCULAR | Status: AC
  Administered 2021-04-06: 15 mg via INTRAVENOUS
  Filled 2021-04-05: qty 1

## 2021-04-05 NOTE — ED Triage Notes (Signed)
Pt reports pain in center of chest.  No n/v/d  No sob.  Pt reports pain worse when lying down.  Pt alert  Speech cler

## 2021-04-05 NOTE — ED Notes (Signed)
Pt reports chest pain since this afternoon, reports it started while eating corn beef and cabbage. Pt states "It feels like it just didn't go down right, like it's sitting right in the middle of my chest." Pt denies any associated symptoms or radiating pain. A&Ox4, denies cardiac hx

## 2021-04-05 NOTE — ED Provider Notes (Signed)
[facility] Emergency Department Provider Note  ____________________________________________   Event Date/Time   First MD Initiated Contact with Patient 04/05/21 2254     (approximate)  I have reviewed the triage vital signs and the nursing notes.   HISTORY  Chief Complaint Chest Pain    HPI Maria Hamilton is a 70 y.o. female with medical history as listed below who presents for evaluation of what she describes as chest pain which is pain located at the very top and middle part of her abdomen just underneath the rib cage.  It started  a few hours ago after she ate some corn beef and cabbage.  She said it feels like it did not go down very well and she was having sharp and somewhat burning discomfort in the area described.  She was not nauseated but after gradually getting worse over period of time she tried to make her self vomit.  She had also taken some Pepto-Bismol and drink some Coca-Cola to see if that would help.  She feels like the Pepto-Bismol and the Coca-Cola came up but did not see a large amount of food.  She continues to feel like "things just did not go down very well".  She has no pain in her lower abdomen.  She describes the pain as moderate to severe at this time.  Nothing particular seems to make it better or worse.  She has a history of chronic musculoskeletal pain mostly in her back but no longer goes to a pain management doctor.  She denies recent fever, chills, and shortness of breath.  No cough.  No history of blood clots in the legs of the lungs.  Of note patient has had multiple abdominal surgeries including a cholecystectomy.        Past Medical History:  Diagnosis Date  . Acute anxiety 08/03/2015   Overview:  Last Assessment & Plan:  Klonopin most helpful  Reports past taking it 1 tab am 1/2 tab midday and 1 tab pm  30 day supply faxed to pharmacy  . Acute bronchitis 12/31/2016  . Acute pancreatitis 10/14/2016  . Altered  mental status 10/12/2016  . Anxiety   . Bronchitis   . Chronic hip pain   . Chronic pain syndrome   . Depression   . Edema   . Fatigue   . Fibromyalgia   . GERD (gastroesophageal reflux disease)   . Headache   . Hypertension   . Insomnia   . Low back pain   . Right upper quadrant abdominal pain   . Vitamin D deficiency     Patient Active Problem List   Diagnosis Date Noted  . Left knee pain 03/07/2021  . Hair loss 03/07/2021  . Hyperlipidemia 01/02/2021  . COVID-19 virus infection 12/14/2020  . History of COVID-19 12/12/2020  . Generalized weakness 12/12/2020  . Unsteady gait 12/12/2020  . Falls 05/02/2020  . Muscle cramps 05/02/2020  . Hot flashes 01/29/2020  . Leg swelling 01/29/2020  . Left foot pain 01/29/2020  . Left shoulder pain 01/29/2020  . Leg edema 01/12/2020  . Toe swelling 08/07/2019  . Numbness and tingling of foot 11/19/2018  . Hot flashes due to menopause 11/19/2018  . Spondylosis without myelopathy or radiculopathy, lumbosacral region 04/28/2018  . Chronic hip pain after total replacement of hip joint (Left) 04/28/2018  . Chronic hip pain after total replacement of hip joint (Right) 04/28/2018  . Chronic pain of hip  (Bilateral) (L>R) 04/28/2018  .  Chronic sacroiliac joint pain (Bilateral) (L>R) 04/28/2018  . Lumbar facet syndrome (Bilateral) (L>R) 04/28/2018  . Vitamin B 12 deficiency 04/02/2018  . Pain of both shoulder joints 04/02/2018  . Degeneration of lumbar intervertebral disc 10/30/2017  . Osteoarthritis of hip 10/30/2017  . History of allergy to radiographic contrast media 10/29/2017  . Ankle pain 10/16/2017  . Anxiety and depression 10/16/2017  . RLS (restless legs syndrome) 10/10/2017  . Night sweats 08/13/2017  . Chronic foot numbness (Left) 04/02/2017  . Vertigo 03/25/2017  . 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) 03/20/2017  . History of lumbar fusion (L3-4, L4-5, and L5-S1) 03/20/2017  . Post herpetic  neuralgia 03/12/2017  . Abnormal MRI, lumbar spine (03/06/2017) 03/12/2017  . Memory difficulty 03/06/2017  . Polyneuropathy 03/06/2017  . Palpitations 11/26/2016  . Epigastric pain 11/26/2016  . Therapeutic opioid-induced constipation (OIC) 11/20/2016  . Constipation   . Hypokalemia 09/27/2016  . Long term current use of opiate analgesic 09/24/2016  . Long term prescription opiate use 09/24/2016  . Opiate use 09/24/2016  . Encounter for therapeutic drug level monitoring 09/24/2016  . Encounter for pain management planning 09/24/2016  . Rheumatoid arthritis, multiple sites (positive RF) 09/24/2016  . Chronic lower extremity pain (Secondary source of pain) (Left) 09/24/2016  . Lumbar facet arthropathy (Bilateral) 09/24/2016  . Failed back surgical syndrome (x 3) 09/24/2016  . Epidural fibrosis 09/24/2016  . Neurogenic pain 09/24/2016  . Musculoskeletal pain 09/24/2016  . Chronic lumbar radicular pain (L5/S1 dermatome) (Left) 09/24/2016  . History of total hip replacement, bilateral 09/24/2016  . GERD (gastroesophageal reflux disease) 08/03/2015  . Rheumatoid factor positive 08/03/2015  . Chronic fatigue 08/03/2015  . Vitamin D deficiency 08/03/2015  . Insomnia 08/03/2015  . Fibromyalgia 08/03/2015  . Chronic low back pain (Primary Source of Pain) (Left) 08/03/2015  . Chronic pain syndrome 08/03/2015  . Hypertension 08/03/2015  . Mechanical complication of internal joint prosthesis ([place]) 04/22/2012    Past Surgical History:  Procedure Laterality Date  . ABDOMINAL HYSTERECTOMY    . BACK SURGERY    . BREAST BIOPSY Left 90s   benign  . BREAST SURGERY    . CHOLECYSTECTOMY N/A 10/16/2016   Procedure: LAPAROSCOPIC CHOLECYSTECTOMY WITH INTRAOPERATIVE CHOLANGIOGRAM;  Surgeon: Dia Crawford III, MD;  Location: [facility] ORS;  Service: General;  Laterality: N/A;  . cyst removal from wrist    . ECTOPIC PREGNANCY SURGERY    . HIP SURGERY     x4  . OOPHORECTOMY      Prior to Admission  medications   Medication Sig Start Date End Date Taking? Authorizing Provider  omeprazole (PRILOSEC OTC) 20 MG tablet Take 1 tablet (20 mg total) by mouth daily. 04/06/21 04/06/22 Yes Hinda Kehr, MD  potassium chloride SA (KLOR-CON M20) 20 MEQ tablet Take 1 tablet (20 mEq total) by mouth daily. 04/06/21  Yes Hinda Kehr, MD  sucralfate (CARAFATE) 1 g tablet Take 1 tablet (1 g total) by mouth 4 (four) times daily as needed (for abdominal discomfort, nausea, and/or vomiting). 04/06/21  Yes Hinda Kehr, MD  albuterol (VENTOLIN HFA) 108 (90 Base) MCG/ACT inhaler Inhale 2 puffs into the lungs every 6 (six) hours as needed for wheezing or shortness of breath. 12/16/20   [place] Eric J, DO  amLODipine (NORVASC) 10 MG tablet Take 10 mg by mouth daily.    [provider]  benzonatate (TESSALON) 200 MG capsule Take 1 capsule (200 mg total) by mouth 2 (two) times daily as needed for cough. 01/02/21  Leone Haven, MD  calcium carbonate (OSCAL) 1500 (600 Ca) MG TABS tablet Take 600 mg of elemental calcium by mouth daily with breakfast.    [provider]  carvedilol (COREG) 12.5 MG tablet Take 1 tablet (12.5 mg total) by mouth 2 (two) times daily with a meal. 03/07/21   Leone Haven, MD  cholecalciferol (VITAMIN D) 1000 units tablet Take 1,000 Units by mouth daily.    [provider]  escitalopram (LEXAPRO) 20 MG tablet Take 1 tablet (20 mg total) by mouth daily. 03/07/21   Leone Haven, MD  gabapentin (NEURONTIN) 600 MG tablet Take 600 mg by mouth at bedtime.    [provider]  guaiFENesin-dextromethorphan (ROBITUSSIN DM) 100-10 MG/5ML syrup Take 5 mLs by mouth every 4 (four) hours as needed for cough. 12/16/20   [place] Donnamarie Poag, DO  rosuvastatin (CRESTOR) 20 MG tablet TAKE 1 TABLET EVERY DAY 08/17/20   Leone Haven, MD  spironolactone (ALDACTONE) 25 MG tablet TAKE 1/2 TABLET BY MOUTH EVERY MORNING 03/10/21   Leone Haven, MD  tiZANidine  (ZANAFLEX) 2 MG tablet TAKE 2 TABLETS(4 MG) BY MOUTH AT BEDTIME AS NEEDED FOR MUSCLE SPASMS 03/01/21   Leone Haven, MD  triamcinolone ointment (KENALOG) 0.5 % Apply 1 application topically 2 (two) times daily. Prn Left neck and right inner arm x1-2 weeks prn 07/20/20   McLean-Scocuzza, Nino Glow, MD  Zinc 220 (50 Zn) MG CAPS Take 220 mg by mouth daily. 12/16/20   [place] Donnamarie Poag, DO    Allergies Ace inhibitors, Ciprofloxacin, Fentanyl, Hctz [hydrochlorothiazide], Latex, Morphine and related, Penicillin g benzathine, Isovue m [iopamidol], and Tape  Family History  Problem Relation Age of Onset  . Heart disease Mother   . Cancer Father   . Alcohol abuse Father     Social History Social History   Tobacco Use  . Smoking status: Never Smoker  . Smokeless tobacco: Never Used  Substance Use Topics  . Alcohol use: No    Alcohol/week: 0.0 standard drinks  . Drug use: No    Review of Systems Constitutional: No fever/chills Eyes: No visual changes. ENT: No sore throat. Cardiovascular: Pain at the very low part of her chest or upper part of her abdomen. Respiratory: Denies shortness of breath. Gastrointestinal: Epigastric abdominal pain.  No nausea or vomiting except self-induced vomiting.  No diarrhea nor constipation. Genitourinary: Negative for dysuria. Musculoskeletal: Negative for neck pain.  Negative for back pain. Integumentary: Negative for rash. Neurological: Negative for headaches, focal weakness or numbness.   ____________________________________________   PHYSICAL EXAM:  VITAL SIGNS: ED Triage Vitals  Enc Vitals Group     BP 04/05/21 2213 (!) 178/82     Pulse Rate 04/05/21 2213 71     Resp 04/05/21 2213 20     Temp 04/05/21 2213 98.8 F (37.1 C)     Temp Source 04/05/21 2213 Oral     SpO2 04/05/21 2213 99 %     Weight 04/05/21 2211 92.1 kg (203 lb)     Height 04/05/21 2211 1.854 m (6\' 1" )     Head Circumference --      Peak Flow --      Pain Score 04/05/21  2211 8     Pain Loc --      Pain Edu? --      Excl. in [place]? --     Constitutional: Alert and oriented.  Eyes: Conjunctivae are normal.  Head: Atraumatic. Nose: No congestion/rhinnorhea. Mouth/Throat: Patient  is wearing a mask. Neck: No stridor.  No meningeal signs.   Cardiovascular: Normal rate, regular rhythm. Good peripheral circulation. Respiratory: Normal respiratory effort.  No retractions. Gastrointestinal: Soft and nondistended.  Tenderness to palpation of the epigastrium.  No significant right upper quadrant tenderness and negative Murphy sign. Musculoskeletal: No lower extremity tenderness nor edema. No gross deformities of extremities.  No reproducible chest wall tenderness. Neurologic:  Normal speech and language. No gross focal neurologic deficits are appreciated.  Skin:  Skin is warm, dry and intact. Psychiatric: Mood and affect are normal. Speech and behavior are normal.  ____________________________________________   LABS (all labs ordered are listed, but only abnormal results are displayed)  Labs Reviewed  BASIC METABOLIC PANEL - Abnormal; Notable for the following components:      Result Value   Potassium 2.9 (*)    Glucose, Bld 113 (*)    All other components within normal limits  CBC - Abnormal; Notable for the following components:   Hemoglobin 11.4 (*)    HCT 34.6 (*)    All other components within normal limits  HEPATIC FUNCTION PANEL  LIPASE, BLOOD  TROPONIN I (HIGH SENSITIVITY)  TROPONIN I (HIGH SENSITIVITY)   ____________________________________________  EKG  ED ECG REPORT I, Hinda Kehr, the attending physician, personally viewed and interpreted this ECG.  Date: 04/05/2021 EKG Time: 22: 20 Rate: 69 Rhythm: normal sinus rhythm QRS Axis: normal Intervals: Left anterior fascicular block ST/T Wave abnormalities: Non-specific ST segment / T-wave changes, but no clear evidence of acute ischemia. Narrative Interpretation: no definitive evidence  of acute ischemia; does not meet STEMI criteria.   ____________________________________________  RADIOLOGY I, Hinda Kehr, personally viewed and evaluated these images (plain radiographs) as part of my medical decision making, as well as reviewing the written report by the radiologist.  ED MD interpretation: No acute abnormalities identified on chest x-ray.  CT scan of the abdomen/pelvis demonstrates no acute abnormality or explanation for her symptoms.  Official radiology report(s): CT ABDOMEN PELVIS WO CONTRAST  Result Date: 04/06/2021 CLINICAL DATA:  Epigastric pain after having a meal EXAM: CT ABDOMEN AND PELVIS WITHOUT CONTRAST TECHNIQUE: Multidetector CT imaging of the abdomen and pelvis was performed following the standard protocol without IV contrast. COMPARISON:  CT 09/18/2017 FINDINGS: Lower chest: Stable 4 mm solid nodule in the periphery of the left lower lobe (4/10), favoring a benign process. Reticular changes in the lung bases likely reflect atelectasis or scarring. Normal heart size. No pericardial effusion. Trace pericardial thickening is similar to prior and may be within physiologic normal. Coronary artery calcifications. Hepatobiliary: No focal liver lesion within limitations of this unenhanced CT. Smooth surface contour. Normal hepatic attenuation. Prior cholecystectomy. No visible intraductal gallstones or significant biliary ductal dilatation. Pancreas: Partial fatty replacement of the pancreas. No pancreatic ductal dilatation or surrounding inflammatory changes. Spleen: Normal in size. No concerning splenic lesions. Adrenals/Urinary Tract: Normal adrenal glands. Fluid attenuation cyst is present in the upper pole right kidney measuring up to 3.9 cm in size. No concerning focal renal lesion. No urolithiasis or hydronephrosis. Urinary bladder is largely decompressed at the time of exam and therefore poorly evaluated by CT imaging. Further limitation by streak artifact from hip  prostheses. No gross abnormality is evident. Stomach/Bowel: Distal esophagus is unremarkable. High attenuation enteric contrast media within the stomach admixed with ingested material. Duodenum is normal with a normal sweep across the midline abdomen. No small bowel thickening or dilatation. Mobile cecal mesentery with the cecum displaced superiorly to the right  upper quadrant. No evidence of volvulus or obstruction. A normal appendix is visualized. No colonic dilatation or wall thickening. Scattered colonic diverticula without focal inflammation to suggest diverticulitis. Vascular/Lymphatic: Atherosclerotic calcifications within the abdominal aorta and branch vessels. No aneurysm or ectasia. No enlarged abdominopelvic lymph nodes. Reproductive: Patient appears to be post hysterectomy though the pelvis is largely obscured by streak artifact. No visible worrisome adnexal lesions. Other: No abdominopelvic free fluid or free gas. No bowel containing hernias. Musculoskeletal: Postsurgical changes from prior decompression and L3-L5 bony fusion as well as PLIF of L5-S1. No acute hardware complication is evident. Multilevel discogenic and facet degenerative changes at the non instrumented levels. Bilateral total hip arthroplasties in expected alignment with the left femoroacetabular fixation screw slightly proud of the cortex though unchanged from comparison. No acute complication or worrisome abnormality. IMPRESSION: 1. No acute intra-abdominal process to explain the patient's symptoms. 2. Mobile cecal mesentery with the cecum displaced superiorly to the right upper quadrant. No evidence of volvulus or obstruction. 3. Colonic diverticulosis without evidence of diverticulitis. 4. Prior cholecystectomy. 5. Bilateral total hip arthroplasties, spinal fusion L3-S1. 6. Aortic Atherosclerosis (ICD10-I70.0). Electronically Signed   By: Lovena Le M.D.   On: 04/06/2021 03:39   DG Chest 2 View  Result Date:  04/05/2021 CLINICAL DATA:  Center chest pain EXAM: CHEST - 2 VIEW COMPARISON:  12/12/2020 FINDINGS: The heart size and mediastinal contours are within normal limits. Both lungs are clear. The visualized skeletal structures are unremarkable. IMPRESSION: No active cardiopulmonary disease. Electronically Signed   By: Donavan Foil M.D.   On: 04/05/2021 22:48    ____________________________________________   PROCEDURES   Procedure(s) performed (including Critical Care):  .1-3 Lead EKG Interpretation Performed by: Hinda Kehr, MD Authorized by: Hinda Kehr, MD     Interpretation: normal     ECG rate:  70   ECG rate assessment: normal     Rhythm: sinus rhythm     Ectopy: none     Conduction: normal       ____________________________________________   INITIAL IMPRESSION / MDM / ASSESSMENT AND PLAN / ED COURSE  As part of my medical decision making, I reviewed the following data within the [place] notes reviewed and incorporated, Labs reviewed , EKG interpreted , Old chart reviewed and Notes from prior ED visits   Differential diagnosis includes, but is not limited to, acid reflux, pancreatitis, SBO/ileus, food bolus impaction, ACS, PE.  The patient is on the cardiac monitor to evaluate for evidence of arrhythmia and/or significant heart rate changes.  Patient describes her discomfort as chest pain but it is really pain in the epigastrium which is reproducible with palpation.  Her medical record indicates a past history of pancreatitis but she does not remember ever having this.  She has no right upper quadrant tenderness.  She does not drink alcohol and she reports no new recent medications.  No lower abdominal pain.  She also said that she does not think she has acid reflux but it also is listed in her past medical history.  The patient is in no significant distress currently although she does feel little bit uncomfortable.  No ischemic changes on her  EKG.  Vital signs are normal other than hypertension.  I personally reviewed the patient's imaging and agree with the radiologist's interpretation that there are no acute abnormalities on chest x-ray.  Basic metabolic panel was notable for some hypokalemia at 2.9 and I will replete this but I will hold  off until after her scan.  Her initial high-sensitivity troponin is 3.  CBC is normal with no leukocytosis.  Adding on hepatic function panel and lipase.  Obtain a CT of the abdomen and pelvis to evaluate for pancreatitis as well as the possibility of SBO/ileus.  Low suspicion for ACS and PE.  I am giving her a GI cocktail with Maalox and viscous lidocaine as well as Bentyl 10 mg and Toradol 15 mg IV.  I was can give patient something stronger but she said that she drove herself to the hospital and states "I want to be able to drive myself right on out of here".    Clinical Course as of 04/06/21 0443  Thu Apr 06, 2021  6195 Patient is ambulatory, walking around, said that she feels much better and is ready to go home.  I updated her regarding the reassuring CT scan results as well as her lab results.  She is comfortable following up as an outpatient.  I gave my usual and customary return precautions. [CF]    Clinical Course User Index [CF] Hinda Kehr, MD     ____________________________________________  FINAL CLINICAL IMPRESSION(S) / ED DIAGNOSES  Final diagnoses:  Epigastric pain  Hypokalemia     MEDICATIONS GIVEN DURING THIS VISIT:  Medications  potassium chloride SA (KLOR-CON) CR tablet 40 mEq (has no administration in time range)  ketorolac (TORADOL) 30 MG/ML injection 15 mg (15 mg Intravenous Given 04/06/21 0014)  dicyclomine (BENTYL) capsule 10 mg (10 mg Oral Given 04/06/21 0019)  alum & mag hydroxide-simeth (MAALOX/MYLANTA) 200-200-20 MG/5ML suspension 30 mL (30 mLs Oral Given 04/06/21 0019)    And  lidocaine (XYLOCAINE) 2 % viscous mouth solution 15 mL (15 mLs Oral Given 04/06/21  0019)  diphenhydrAMINE (BENADRYL) 50 MG/ML injection (25 mg  Given 04/06/21 0024)  Barium Sulfate 2.1 % SUSP 900 mL (900 mLs Oral Given 04/06/21 0152)     ED Discharge Orders         Ordered    omeprazole (PRILOSEC OTC) 20 MG tablet  Daily        04/06/21 0433    potassium chloride SA (KLOR-CON M20) 20 MEQ tablet  Daily        04/06/21 0433    sucralfate (CARAFATE) 1 g tablet  4 times daily PRN        04/06/21 0433          *Please note:  Maria Hamilton was evaluated in Emergency Department on 04/06/2021 for the symptoms described in the history of present illness. She was evaluated in the context of the global COVID-19 pandemic, which necessitated consideration that the patient might be at risk for infection with the SARS-CoV-2 virus that causes COVID-19. Institutional protocols and algorithms that pertain to the evaluation of patients at risk for COVID-19 are in a state of rapid change based on information released by regulatory bodies including the CDC and federal and state organizations. These policies and algorithms were followed during the patient's care in the ED.  Some ED evaluations and interventions may be delayed as a result of limited staffing during and after the pandemic.*  Note:  This document was prepared using Dragon voice recognition software and may include unintentional dictation errors.   Hinda Kehr, MD 04/06/21 [phone]

## 2021-04-06 ENCOUNTER — Emergency Department: Payer: Medicare HMO

## 2021-04-06 DIAGNOSIS — K3189 Other diseases of stomach and duodenum: Secondary | ICD-10-CM | POA: Diagnosis not present

## 2021-04-06 DIAGNOSIS — N281 Cyst of kidney, acquired: Secondary | ICD-10-CM | POA: Diagnosis not present

## 2021-04-06 DIAGNOSIS — I700 Atherosclerosis of aorta: Secondary | ICD-10-CM | POA: Diagnosis not present

## 2021-04-06 DIAGNOSIS — Q433 Congenital malformations of intestinal fixation: Secondary | ICD-10-CM | POA: Diagnosis not present

## 2021-04-06 LAB — TROPONIN I (HIGH SENSITIVITY): Troponin I (High Sensitivity): 3 ng/L (ref <18)

## 2021-04-06 MED ORDER — SUCRALFATE 1 G PO TABS: 1.0000 g | ORAL_TABLET | Freq: Four times a day (QID) | ORAL | 0 refills | Status: DC | PRN

## 2021-04-06 MED ORDER — BARIUM SULFATE 2.1 % PO SUSP
900.0000 mL | Freq: Once | ORAL | Status: AC
  Administered 2021-04-06: 900 mL via ORAL

## 2021-04-06 MED ORDER — POTASSIUM CHLORIDE CRYS ER 20 MEQ PO TBCR
40.0000 meq | EXTENDED_RELEASE_TABLET | Freq: Once | ORAL | Status: AC
  Administered 2021-04-06: 40 meq via ORAL
  Filled 2021-04-06: qty 2

## 2021-04-06 MED ORDER — POTASSIUM CHLORIDE CRYS ER 20 MEQ PO TBCR: 20.0000 meq | EXTENDED_RELEASE_TABLET | Freq: Every day | ORAL | 0 refills | Status: DC

## 2021-04-06 MED ORDER — OMEPRAZOLE MAGNESIUM 20 MG PO TBEC: 20.0000 mg | DELAYED_RELEASE_TABLET | Freq: Every day | ORAL | 0 refills | Status: DC

## 2021-04-06 MED ORDER — DIPHENHYDRAMINE HCL 50 MG/ML IJ SOLN
INTRAMUSCULAR | Status: AC
  Administered 2021-04-06: 25 mg
  Filled 2021-04-06: qty 1

## 2021-04-06 NOTE — ED Notes (Signed)
Meds given per EDP orders.

## 2021-04-06 NOTE — Discharge Instructions (Signed)

## 2021-04-06 NOTE — ED Notes (Signed)
PT co epigastric, chest pain rates it 8/10

## 2021-04-06 NOTE — ED Notes (Addendum)
Pt co acute onset or itching all over, EDP made aware.

## 2021-04-06 NOTE — ED Notes (Signed)
Pt states itching has improved after benadryl, no co facial swelling or shob at this time.

## 2021-04-06 NOTE — ED Notes (Signed)
Patient transported to CT 

## 2021-04-18 DIAGNOSIS — H4322 Crystalline deposits in vitreous body, left eye: Secondary | ICD-10-CM | POA: Diagnosis not present

## 2021-04-18 DIAGNOSIS — H5213 Myopia, bilateral: Secondary | ICD-10-CM | POA: Diagnosis not present

## 2021-04-18 DIAGNOSIS — H52223 Regular astigmatism, bilateral: Secondary | ICD-10-CM | POA: Diagnosis not present

## 2021-04-18 DIAGNOSIS — H2513 Age-related nuclear cataract, bilateral: Secondary | ICD-10-CM | POA: Diagnosis not present

## 2021-04-26 ENCOUNTER — Telehealth: Payer: Medicare HMO | Admitting: Adult Health

## 2021-04-26 ENCOUNTER — Telehealth: Payer: Self-pay

## 2021-04-26 NOTE — Progress Notes (Deleted)
Virtual Visit via Video Note  I connected with Raeford Razor on 04/26/21 at  2:30 PM EDT by a video enabled telemedicine application and verified that I am speaking with the correct person using two identifiers. Parties involved in visit as below:   Location: Patient: at home  Provider: Provider: Provider's office at  [facility], [place].      I discussed the limitations of evaluation and management by telemedicine and the availability of in person appointments. The patient expressed understanding and agreed to proceed.  History of Present Illness:    Observations/Objective:   Assessment and Plan:   Follow Up Instructions:    I discussed the assessment and treatment plan with the patient. The patient was provided an opportunity to ask questions and all were answered. The patient agreed with the plan and demonstrated an understanding of the instructions.   The patient was advised to call back or seek an in-person evaluation if the symptoms worsen or if the condition fails to improve as anticipated.  I provided *** minutes of non-face-to-face time during this encounter.   Marcille Buffy, FNP

## 2021-04-26 NOTE — Telephone Encounter (Signed)
Called and spoke to [employer]. Maria Hamilton stated that she is unable to do a video visit today as her husband has the smart phone and she can not join the video. She states that she had thought the appointment was for Thursday and not today. She has a rash and states that the visit is unneeded as she believes she has poison Ivy and is taking OTC medication for it. Maria Hamilton declined making another appointment.

## 2021-05-01 ENCOUNTER — Other Ambulatory Visit: Payer: Self-pay | Admitting: Family Medicine

## 2021-05-22 ENCOUNTER — Other Ambulatory Visit: Payer: Self-pay | Admitting: Family Medicine

## 2021-05-29 ENCOUNTER — Other Ambulatory Visit: Payer: Self-pay | Admitting: Family Medicine

## 2021-05-29 DIAGNOSIS — E7800 Pure hypercholesterolemia, unspecified: Secondary | ICD-10-CM

## 2021-05-29 DIAGNOSIS — G629 Polyneuropathy, unspecified: Secondary | ICD-10-CM

## 2021-05-29 DIAGNOSIS — I10 Essential (primary) hypertension: Secondary | ICD-10-CM

## 2021-07-05 ENCOUNTER — Other Ambulatory Visit: Payer: Self-pay | Admitting: Family Medicine

## 2021-07-11 ENCOUNTER — Telehealth: Payer: Self-pay

## 2021-07-11 ENCOUNTER — Other Ambulatory Visit: Payer: Self-pay

## 2021-07-11 DIAGNOSIS — M62838 Other muscle spasm: Secondary | ICD-10-CM

## 2021-07-11 DIAGNOSIS — I10 Essential (primary) hypertension: Secondary | ICD-10-CM

## 2021-07-11 MED ORDER — TIZANIDINE HCL 2 MG PO TABS: ORAL_TABLET | ORAL | 1 refills | Status: DC

## 2021-07-11 MED ORDER — SPIRONOLACTONE 25 MG PO TABS: 12.5000 mg | ORAL_TABLET | Freq: Every morning | ORAL | 1 refills | Status: DC

## 2021-07-28 DIAGNOSIS — H2513 Age-related nuclear cataract, bilateral: Secondary | ICD-10-CM | POA: Diagnosis not present

## 2021-07-31 ENCOUNTER — Other Ambulatory Visit: Payer: Self-pay | Admitting: Family Medicine

## 2021-07-31 DIAGNOSIS — F419 Anxiety disorder, unspecified: Secondary | ICD-10-CM

## 2021-08-04 ENCOUNTER — Other Ambulatory Visit: Payer: Self-pay | Admitting: Family Medicine

## 2021-08-04 DIAGNOSIS — M62838 Other muscle spasm: Secondary | ICD-10-CM

## 2021-08-17 DIAGNOSIS — H2511 Age-related nuclear cataract, right eye: Secondary | ICD-10-CM | POA: Diagnosis not present

## 2021-08-17 DIAGNOSIS — H25011 Cortical age-related cataract, right eye: Secondary | ICD-10-CM | POA: Diagnosis not present

## 2021-08-18 DIAGNOSIS — H2512 Age-related nuclear cataract, left eye: Secondary | ICD-10-CM | POA: Diagnosis not present

## 2021-08-30 ENCOUNTER — Ambulatory Visit: Payer: Medicare HMO | Admitting: Dermatology

## 2021-09-19 NOTE — Telephone Encounter (Signed)
Error.  Jonathan Kirkendoll,cma  

## 2021-09-21 DIAGNOSIS — H25012 Cortical age-related cataract, left eye: Secondary | ICD-10-CM | POA: Diagnosis not present

## 2021-09-21 DIAGNOSIS — H2512 Age-related nuclear cataract, left eye: Secondary | ICD-10-CM | POA: Diagnosis not present

## 2021-10-18 ENCOUNTER — Other Ambulatory Visit: Payer: Self-pay | Admitting: Family Medicine

## 2021-10-18 DIAGNOSIS — M62838 Other muscle spasm: Secondary | ICD-10-CM

## 2021-10-19 ENCOUNTER — Other Ambulatory Visit: Payer: Self-pay | Admitting: Family Medicine

## 2021-10-19 DIAGNOSIS — M62838 Other muscle spasm: Secondary | ICD-10-CM

## 2021-11-03 ENCOUNTER — Other Ambulatory Visit: Payer: Self-pay | Admitting: Family Medicine

## 2021-11-03 DIAGNOSIS — M62838 Other muscle spasm: Secondary | ICD-10-CM

## 2021-11-13 ENCOUNTER — Telehealth: Payer: Self-pay | Admitting: Family Medicine

## 2021-11-13 NOTE — Telephone Encounter (Signed)
Pt called in regards to possibly having flu. Pt is experiencing night sweats and vomiting. Pt states she is unable to keep anything down including water. Was connecting pt to access nurse however she disconnected her line. Pt information was still sent to access nurse.

## 2021-11-13 NOTE — Telephone Encounter (Signed)
Called and scheduled Patient for virtual with Dutch Quint tomorrow 11/14/21. States her great-granddaughter had the flu last week.

## 2021-11-14 ENCOUNTER — Encounter (INDEPENDENT_AMBULATORY_CARE_PROVIDER_SITE_OTHER): Payer: Medicare HMO | Admitting: Family

## 2021-11-14 ENCOUNTER — Other Ambulatory Visit: Payer: Self-pay

## 2021-11-16 NOTE — Progress Notes (Signed)
No show

## 2021-12-01 ENCOUNTER — Other Ambulatory Visit: Payer: Self-pay

## 2021-12-01 ENCOUNTER — Encounter: Payer: Self-pay | Admitting: Family Medicine

## 2021-12-01 ENCOUNTER — Ambulatory Visit (INDEPENDENT_AMBULATORY_CARE_PROVIDER_SITE_OTHER): Payer: Medicare HMO | Admitting: Family Medicine

## 2021-12-01 VITALS — Ht 73.0 in | Wt 206.0 lb

## 2021-12-01 DIAGNOSIS — G629 Polyneuropathy, unspecified: Secondary | ICD-10-CM

## 2021-12-01 DIAGNOSIS — Z780 Asymptomatic menopausal state: Secondary | ICD-10-CM

## 2021-12-01 DIAGNOSIS — Z1231 Encounter for screening mammogram for malignant neoplasm of breast: Secondary | ICD-10-CM | POA: Diagnosis not present

## 2021-12-01 DIAGNOSIS — Z1211 Encounter for screening for malignant neoplasm of colon: Secondary | ICD-10-CM | POA: Diagnosis not present

## 2021-12-01 DIAGNOSIS — I10 Essential (primary) hypertension: Secondary | ICD-10-CM

## 2021-12-01 MED ORDER — GABAPENTIN 600 MG PO TABS: ORAL_TABLET | ORAL | 1 refills | Status: DC

## 2021-12-01 NOTE — Assessment & Plan Note (Signed)
I wonder if she is taking the nighttime dose of gabapentin too early.  We will have her shift this closer to her bedtime and see if that helps.  If that is not beneficial she will let me know and we can increase the dose of her nighttime gabapentin.

## 2021-12-01 NOTE — Assessment & Plan Note (Signed)
At goal.  She will continue amlodipine 10 mg once daily, carvedilol 12.5 mg twice daily, and spironolactone 12.5 mg daily.  We will have her in for lab work.

## 2021-12-01 NOTE — Progress Notes (Signed)
Virtual Visit via telephone Note  This visit type was conducted due to national recommendations for restrictions regarding the COVID-19 pandemic (e.g. social distancing).  This format is felt to be most appropriate for this patient at this time.  All issues noted in this document were discussed and addressed.  No physical exam was performed (except for noted visual exam findings with Video Visits).   I connected with Maria Hamilton today at  2:45 PM EST by telephone and verified that I am speaking with the correct person using two identifiers. Location patient: home Location provider: work  Persons participating in the virtual visit: patient, provider  I discussed the limitations, risks, security and privacy concerns of performing an evaluation and management service by telephone and the availability of in person appointments. I also discussed with the patient that there may be a patient responsible charge related to this service. The patient expressed understanding and agreed to proceed.  Interactive audio and video telecommunications were attempted between this provider and patient, however failed, due to patient having technical difficulties OR patient did not have access to video capability.  We continued and completed visit with audio only.   Reason for visit: f/u.  HPI: HYPERTENSION Disease Monitoring Home BP Monitoring 120s/71 Chest pain- no    Dyspnea- no Medications Compliance-  taking amlodipine, coreg, spironolactone.  Edema- no BMET    Component Value Date/Time   NA 137 04/05/2021 2228   NA 137 03/02/2014 1131   K 2.9 (L) 04/05/2021 2228   K 3.2 (L) 03/02/2014 1131   CL 105 04/05/2021 2228   CL 104 03/02/2014 1131   CO2 24 04/05/2021 2228   CO2 30 03/02/2014 1131   GLUCOSE 113 (H) 04/05/2021 2228   GLUCOSE 88 03/02/2014 1131   BUN 8 04/05/2021 2228   BUN 5 (L) 03/02/2014 1131   CREATININE 0.87 04/05/2021 2228   CREATININE 1.00 (H) 02/05/2020 1350   CALCIUM 9.0  04/05/2021 2228   CALCIUM 8.5 03/02/2014 1131   GFRNONAA >60 04/05/2021 2228   GFRNONAA >60 03/02/2014 1131   GFRAA >60 07/10/2018 1315   GFRAA >60 03/02/2014 1131   Neuropathy: Patient notes her neuropathy bothers her more at night now.  She falls asleep fine but wakes up around 3 AM with her neuropathy bothering her.  She takes 300 mg of gabapentin in the morning and 600 mg in the evening.  She takes the medication around 8 PM and gets in bed around 9:30 and falls asleep around 11.  She notes no drowsiness with the gabapentin.   ROS: See pertinent positives and negatives per HPI.  Past Medical History:  Diagnosis Date   Acute anxiety 08/03/2015   Overview:  Last Assessment & Plan:  Klonopin most helpful  Reports past taking it 1 tab am 1/2 tab midday and 1 tab pm  30 day supply faxed to pharmacy   Acute bronchitis 12/31/2016   Acute pancreatitis 10/14/2016   Altered mental status 10/12/2016   Anxiety    Bronchitis    Chronic hip pain    Chronic pain syndrome    Depression    Edema    Fatigue    Fibromyalgia    GERD (gastroesophageal reflux disease)    Headache    Hypertension    Insomnia    Low back pain    Right upper quadrant abdominal pain    Vitamin D deficiency     Past Surgical History:  Procedure Laterality Date   ABDOMINAL HYSTERECTOMY  BACK SURGERY     BREAST BIOPSY Left 90s   benign   BREAST SURGERY     CHOLECYSTECTOMY N/A 10/16/2016   Procedure: LAPAROSCOPIC CHOLECYSTECTOMY WITH INTRAOPERATIVE CHOLANGIOGRAM;  Surgeon: Dia Crawford III, MD;  Location: [facility] ORS;  Service: General;  Laterality: N/A;   cyst removal from wrist     ECTOPIC PREGNANCY SURGERY     HIP SURGERY     x4   OOPHORECTOMY      Family History  Problem Relation Age of Onset   Heart disease Mother    Cancer Father    Alcohol abuse Father     SOCIAL HX: Non-smoker   Current Outpatient Medications:    albuterol (VENTOLIN HFA) 108 (90 Base) MCG/ACT inhaler, Inhale 2 puffs into the  lungs every 6 (six) hours as needed for wheezing or shortness of breath., Disp: 8 g, Rfl: 2   amLODipine (NORVASC) 10 MG tablet, TAKE 1 TABLET EVERY DAY, Disp: 90 tablet, Rfl: 1   benzonatate (TESSALON) 200 MG capsule, Take 1 capsule (200 mg total) by mouth 2 (two) times daily as needed for cough., Disp: 20 capsule, Rfl: 0   calcium carbonate (OSCAL) 1500 (600 Ca) MG TABS tablet, Take 600 mg of elemental calcium by mouth daily with breakfast., Disp: , Rfl:    carvedilol (COREG) 12.5 MG tablet, TAKE 1 TABLET TWICE DAILY WITH A MEAL, Disp: 180 tablet, Rfl: 1   cholecalciferol (VITAMIN D) 1000 units tablet, Take 1,000 Units by mouth daily., Disp: , Rfl:    escitalopram (LEXAPRO) 20 MG tablet, TAKE 1 TABLET EVERY DAY, Disp: 90 tablet, Rfl: 1   gabapentin (NEURONTIN) 600 MG tablet, Take 0.5 tablets (300 mg total) by mouth every morning AND 1 tablet (600 mg total) at bedtime., Disp: 135 tablet, Rfl: 1   guaiFENesin-dextromethorphan (ROBITUSSIN DM) 100-10 MG/5ML syrup, Take 5 mLs by mouth every 4 (four) hours as needed for cough., Disp: 118 mL, Rfl: 0   omeprazole (PRILOSEC OTC) 20 MG tablet, Take 1 tablet (20 mg total) by mouth daily., Disp: 28 tablet, Rfl: 0   potassium chloride SA (KLOR-CON) 20 MEQ tablet, TAKE 1 TABLET EVERY DAY, Disp: 90 tablet, Rfl: 1   rosuvastatin (CRESTOR) 20 MG tablet, TAKE 1 TABLET EVERY DAY, Disp: 90 tablet, Rfl: 3   spironolactone (ALDACTONE) 25 MG tablet, Take 0.5 tablets (12.5 mg total) by mouth every morning., Disp: 90 tablet, Rfl: 1   sucralfate (CARAFATE) 1 g tablet, Take 1 tablet (1 g total) by mouth 4 (four) times daily as needed (for abdominal discomfort, nausea, and/or vomiting)., Disp: 30 tablet, Rfl: 0   tiZANidine (ZANAFLEX) 2 MG tablet, TAKE 2 TABLETS AT BEDTIME AS NEEDED FOR MUSCLE SPASMS, Disp: 30 tablet, Rfl: 1   triamcinolone ointment (KENALOG) 0.5 %, Apply 1 application topically 2 (two) times daily. Prn Left neck and right inner arm x1-2 weeks prn, Disp: 60 g,  Rfl: 0   Zinc 220 (50 Zn) MG CAPS, Take 220 mg by mouth daily., Disp: 30 capsule, Rfl: 0  EXAM: This was a telephone visit and thus no exam was completed.  ASSESSMENT AND PLAN:  Discussed the following assessment and plan:  Problem List Items Addressed This Visit     Hypertension    At goal.  She will continue amlodipine 10 mg once daily, carvedilol 12.5 mg twice daily, and spironolactone 12.5 mg daily.  We will have her in for lab work.      Polyneuropathy    I wonder if she is  taking the nighttime dose of gabapentin too early.  We will have her shift this closer to her bedtime and see if that helps.  If that is not beneficial she will let me know and we can increase the dose of her nighttime gabapentin.      Relevant Medications   gabapentin (NEURONTIN) 600 MG tablet   Other Visit Diagnoses     Encounter for screening mammogram for malignant neoplasm of breast    -  Primary   Relevant Orders   MM 3D SCREEN BREAST BILATERAL   Postmenopausal estrogen deficiency       Relevant Orders   DG Bone Density   Colon cancer screening       Relevant Orders   Ambulatory referral to Gastroenterology      Health maintenance: Patient declines COVID-vaccine and flu vaccine.  She will consider the Shingrix vaccine.  Referral for colonoscopy placed.  Referral for mammogram and bone density scan placed.  She knows she needs to call to schedule the mammogram and bone density scan.  Return in about 3 months (around 03/01/2022) for Neuropathy.   I discussed the assessment and treatment plan with the patient. The patient was provided an opportunity to ask questions and all were answered. The patient agreed with the plan and demonstrated an understanding of the instructions.   The patient was advised to call back or seek an in-person evaluation if the symptoms worsen or if the condition fails to improve as anticipated.  I provided 11 minutes of non-face-to-face time during this  encounter.   [name], MD

## 2021-12-12 ENCOUNTER — Other Ambulatory Visit: Payer: Self-pay

## 2021-12-12 DIAGNOSIS — Z1211 Encounter for screening for malignant neoplasm of colon: Secondary | ICD-10-CM

## 2021-12-12 MED ORDER — PEG 3350-KCL-NA BICARB-NACL 420 G PO SOLR: 4000.0000 mL | Freq: Once | ORAL | 0 refills | Status: AC

## 2021-12-12 NOTE — Progress Notes (Signed)
Gastroenterology Pre-Procedure Review  Request Date: 12/22/2021 Requesting Physician: Dr. Allen Norris  PATIENT REVIEW QUESTIONS: The patient responded to the following health history questions as indicated:    1. Are you having any GI issues? no 2. Do you have a personal history of Polyps? yes (04/2010 No polyps removed) 3. Do you have a family history of Colon Cancer or Polyps? no 4. Diabetes Mellitus? no 5. Joint replacements in the past 12 months?no 6. Major health problems in the past 3 months?no 7. Any artificial heart valves, MVP, or defibrillator?no    MEDICATIONS & ALLERGIES:    Patient reports the following regarding taking any anticoagulation/antiplatelet therapy:   Plavix, Coumadin, Eliquis, Xarelto, Lovenox, Pradaxa, Brilinta, or Effient? no Aspirin? no  Patient confirms/reports the following medications:  Current Outpatient Medications  Medication Sig Dispense Refill   albuterol (VENTOLIN HFA) 108 (90 Base) MCG/ACT inhaler Inhale 2 puffs into the lungs every 6 (six) hours as needed for wheezing or shortness of breath. 8 g 2   amLODipine (NORVASC) 10 MG tablet TAKE 1 TABLET EVERY DAY 90 tablet 1   benzonatate (TESSALON) 200 MG capsule Take 1 capsule (200 mg total) by mouth 2 (two) times daily as needed for cough. 20 capsule 0   calcium carbonate (OSCAL) 1500 (600 Ca) MG TABS tablet Take 600 mg of elemental calcium by mouth daily with breakfast.     carvedilol (COREG) 12.5 MG tablet TAKE 1 TABLET TWICE DAILY WITH A MEAL 180 tablet 1   cholecalciferol (VITAMIN D) 1000 units tablet Take 1,000 Units by mouth daily.     escitalopram (LEXAPRO) 20 MG tablet TAKE 1 TABLET EVERY DAY 90 tablet 1   gabapentin (NEURONTIN) 600 MG tablet Take 0.5 tablets (300 mg total) by mouth every morning AND 1 tablet (600 mg total) at bedtime. 135 tablet 1   guaiFENesin-dextromethorphan (ROBITUSSIN DM) 100-10 MG/5ML syrup Take 5 mLs by mouth every 4 (four) hours as needed for cough. 118 mL 0   omeprazole  (PRILOSEC OTC) 20 MG tablet Take 1 tablet (20 mg total) by mouth daily. 28 tablet 0   potassium chloride SA (KLOR-CON) 20 MEQ tablet TAKE 1 TABLET EVERY DAY 90 tablet 1   rosuvastatin (CRESTOR) 20 MG tablet TAKE 1 TABLET EVERY DAY 90 tablet 3   spironolactone (ALDACTONE) 25 MG tablet Take 0.5 tablets (12.5 mg total) by mouth every morning. 90 tablet 1   sucralfate (CARAFATE) 1 g tablet Take 1 tablet (1 g total) by mouth 4 (four) times daily as needed (for abdominal discomfort, nausea, and/or vomiting). 30 tablet 0   tiZANidine (ZANAFLEX) 2 MG tablet TAKE 2 TABLETS AT BEDTIME AS NEEDED FOR MUSCLE SPASMS 30 tablet 1   triamcinolone ointment (KENALOG) 0.5 % Apply 1 application topically 2 (two) times daily. Prn Left neck and right inner arm x1-2 weeks prn 60 g 0   Zinc 220 (50 Zn) MG CAPS Take 220 mg by mouth daily. 30 capsule 0   No current facility-administered medications for this visit.    Patient confirms/reports the following allergies:  Allergies  Allergen Reactions   Ace Inhibitors Swelling    Angioedema Angioedema Angioedema   Ciprofloxacin Itching and Rash   Fentanyl Itching   Hctz [Hydrochlorothiazide]     hypoK    Latex    Morphine And Related Itching   Penicillin G Benzathine    Isovue M [Iopamidol] Itching    Pt was given 141mL of Isovue 370 for CT Scan today @ 11:10am. After injection she started  itching all over. We gave her 50mG  of Benadryl IV.    Tape Rash    No orders of the defined types were placed in this encounter.   AUTHORIZATION INFORMATION Primary Insurance: 1D#: Group #:  Secondary Insurance: 1D#: Group #:  SCHEDULE INFORMATION: Date: 12/22/2021 Time: Location: [place]

## 2021-12-13 ENCOUNTER — Telehealth: Payer: Self-pay | Admitting: Family Medicine

## 2021-12-13 DIAGNOSIS — M62838 Other muscle spasm: Secondary | ICD-10-CM

## 2021-12-13 MED ORDER — TIZANIDINE HCL 2 MG PO TABS: ORAL_TABLET | ORAL | 1 refills | Status: DC

## 2021-12-13 NOTE — Telephone Encounter (Signed)
Patient called in requesting a refill  Tizanidine 2 mg

## 2021-12-13 NOTE — Telephone Encounter (Signed)
Sent to the pharmacy

## 2021-12-13 NOTE — Telephone Encounter (Signed)
Patient called in requesting a refill  Tizanidine 2 mg.  Kanika Bungert,cma

## 2021-12-14 ENCOUNTER — Ambulatory Visit (INDEPENDENT_AMBULATORY_CARE_PROVIDER_SITE_OTHER): Payer: Medicare HMO | Admitting: Registered Nurse

## 2021-12-14 DIAGNOSIS — Z8616 Personal history of COVID-19: Secondary | ICD-10-CM

## 2021-12-14 DIAGNOSIS — J22 Unspecified acute lower respiratory infection: Secondary | ICD-10-CM | POA: Diagnosis not present

## 2021-12-14 MED ORDER — BENZONATATE 200 MG PO CAPS: 200.0000 mg | ORAL_CAPSULE | Freq: Two times a day (BID) | ORAL | 0 refills | Status: DC | PRN

## 2021-12-14 MED ORDER — PREDNISONE 10 MG (21) PO TBPK: ORAL_TABLET | ORAL | 0 refills | Status: DC

## 2021-12-14 MED ORDER — PROMETHAZINE-DM 6.25-15 MG/5ML PO SYRP: 5.0000 mL | ORAL_SOLUTION | Freq: Four times a day (QID) | ORAL | 0 refills | Status: DC | PRN

## 2021-12-14 MED ORDER — AZITHROMYCIN 250 MG PO TABS: ORAL_TABLET | ORAL | 0 refills | Status: AC

## 2021-12-14 NOTE — Progress Notes (Signed)
Telemedicine Encounter- SOAP NOTE Established Patient  This telephone encounter was conducted with the patient's (or proxy's) verbal consent via audio telecommunications: yes/no: Yes Patient was instructed to have this encounter in a suitably private space; and to only have persons present to whom they give permission to participate. In addition, patient identity was confirmed by use of name plus two identifiers (DOB and address).  I discussed the limitations, risks, security and privacy concerns of performing an evaluation and management service by telephone and the availability of in person appointments. I also discussed with the patient that there may be a patient responsible charge related to this service. The patient expressed understanding and agreed to proceed.  I spent a total of 16 minutes talking with the patient or their proxy.  Patient at home Provider in office  Participants: Kathrin Ruddy, NP and Raeford Razor  Chief Complaint  Patient presents with   Cough    Productive cough, reports thick and grey mucous, reports used tussin that she has had previously, started apx 1 week ago, no fever, chills or body aches at this time     Subjective   Maria Hamilton is a 70 y.o. established patient. Telephone visit today for cough  HPI Onset 1 week ago.  Cough with thick grey/green mucus.  Sick contacts - grandchildren, who resolved within a few days  Taking OTC without relief.   No fever, chills, body aches, nvd, chest pain, shob, doe, headaches.   Patient Active Problem List   Diagnosis Date Noted   Left knee pain 03/07/2021   Hair loss 03/07/2021   Hyperlipidemia 01/02/2021   COVID-19 virus infection 12/14/2020   History of COVID-19 12/12/2020   Generalized weakness 12/12/2020   Unsteady gait 12/12/2020   Falls 05/02/2020   Muscle cramps 05/02/2020   Hot flashes 01/29/2020   Leg swelling 01/29/2020   Left foot pain 01/29/2020   Left shoulder  pain 01/29/2020   Leg edema 01/12/2020   Toe swelling 08/07/2019   Numbness and tingling of foot 11/19/2018   Hot flashes due to menopause 11/19/2018   Spondylosis without myelopathy or radiculopathy, lumbosacral region 04/28/2018   Chronic hip pain after total replacement of hip joint (Left) 04/28/2018   Chronic hip pain after total replacement of hip joint (Right) 04/28/2018   Chronic pain of hip  (Bilateral) (L>R) 04/28/2018   Chronic sacroiliac joint pain (Bilateral) (L>R) 04/28/2018   Lumbar facet syndrome (Bilateral) (L>R) 04/28/2018   Vitamin B 12 deficiency 04/02/2018   Pain of both shoulder joints 04/02/2018   Degeneration of lumbar intervertebral disc 10/30/2017   Osteoarthritis of hip 10/30/2017   History of allergy to radiographic contrast media 10/29/2017   Ankle pain 10/16/2017   Anxiety and depression 10/16/2017   RLS (restless legs syndrome) 10/10/2017   Night sweats 08/13/2017   Chronic foot numbness (Left) 04/02/2017   Vertigo 03/25/2017   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) 03/20/2017   History of lumbar fusion (L3-4, L4-5, and L5-S1) 03/20/2017   Post herpetic neuralgia 03/12/2017   Abnormal MRI, lumbar spine (03/06/2017) 03/12/2017   Memory difficulty 03/06/2017   Polyneuropathy 03/06/2017   Palpitations 11/26/2016   Epigastric pain 11/26/2016   Therapeutic opioid-induced constipation (OIC) 11/20/2016   Constipation    Hypokalemia 09/27/2016   Long term current use of opiate analgesic 09/24/2016   Long term prescription opiate use 09/24/2016   Opiate use 09/24/2016   Encounter for therapeutic drug level monitoring 09/24/2016   Encounter  for pain management planning 09/24/2016   Rheumatoid arthritis, multiple sites (positive RF) 09/24/2016   Chronic lower extremity pain (Secondary source of pain) (Left) 09/24/2016   Lumbar facet arthropathy (Bilateral) 09/24/2016   Failed back surgical syndrome (x 3) 09/24/2016   Epidural fibrosis  09/24/2016   Neurogenic pain 09/24/2016   Musculoskeletal pain 09/24/2016   Chronic lumbar radicular pain (L5/S1 dermatome) (Left) 09/24/2016   History of total hip replacement, bilateral 09/24/2016   GERD (gastroesophageal reflux disease) 08/03/2015   Rheumatoid factor positive 08/03/2015   Chronic fatigue 08/03/2015   Vitamin D deficiency 08/03/2015   Insomnia 08/03/2015   Fibromyalgia 08/03/2015   Chronic low back pain (Primary Source of Pain) (Left) 08/03/2015   Chronic pain syndrome 08/03/2015   Hypertension 08/03/2015   Mechanical complication of internal joint prosthesis ([place]) 04/22/2012    Past Medical History:  Diagnosis Date   Acute anxiety 08/03/2015   Overview:  Last Assessment & Plan:  Klonopin most helpful  Reports past taking it 1 tab am 1/2 tab midday and 1 tab pm  30 day supply faxed to pharmacy   Acute bronchitis 12/31/2016   Acute pancreatitis 10/14/2016   Altered mental status 10/12/2016   Anxiety    Bronchitis    Chronic hip pain    Chronic pain syndrome    Depression    Edema    Fatigue    Fibromyalgia    GERD (gastroesophageal reflux disease)    Headache    Hypertension    Insomnia    Low back pain    Right upper quadrant abdominal pain    Vitamin D deficiency     Current Outpatient Medications  Medication Sig Dispense Refill   albuterol (VENTOLIN HFA) 108 (90 Base) MCG/ACT inhaler Inhale 2 puffs into the lungs every 6 (six) hours as needed for wheezing or shortness of breath. 8 g 2   amLODipine (NORVASC) 10 MG tablet TAKE 1 TABLET EVERY DAY 90 tablet 1   azithromycin (ZITHROMAX) 250 MG tablet Take 2 tablets on day 1, then 1 tablet daily on days 2 through 5 6 tablet 0   benzonatate (TESSALON) 200 MG capsule Take 1 capsule (200 mg total) by mouth 2 (two) times daily as needed for cough. 20 capsule 0   calcium carbonate (OSCAL) 1500 (600 Ca) MG TABS tablet Take 600 mg of elemental calcium by mouth daily with breakfast.     carvedilol (COREG) 12.5 MG  tablet TAKE 1 TABLET TWICE DAILY WITH A MEAL 180 tablet 1   cholecalciferol (VITAMIN D) 1000 units tablet Take 1,000 Units by mouth daily.     escitalopram (LEXAPRO) 20 MG tablet TAKE 1 TABLET EVERY DAY 90 tablet 1   gabapentin (NEURONTIN) 600 MG tablet Take 0.5 tablets (300 mg total) by mouth every morning AND 1 tablet (600 mg total) at bedtime. 135 tablet 1   guaiFENesin-dextromethorphan (ROBITUSSIN DM) 100-10 MG/5ML syrup Take 5 mLs by mouth every 4 (four) hours as needed for cough. 118 mL 0   omeprazole (PRILOSEC OTC) 20 MG tablet Take 1 tablet (20 mg total) by mouth daily. 28 tablet 0   potassium chloride SA (KLOR-CON) 20 MEQ tablet TAKE 1 TABLET EVERY DAY 90 tablet 1   predniSONE (STERAPRED UNI-PAK 21 TAB) 10 MG (21) TBPK tablet Take per package instructions. Do not skip doses. Finish entire supply. 1 each 0   promethazine-dextromethorphan (PROMETHAZINE-DM) 6.25-15 MG/5ML syrup Take 5 mLs by mouth 4 (four) times daily as needed for cough. 118 mL 0  rosuvastatin (CRESTOR) 20 MG tablet TAKE 1 TABLET EVERY DAY 90 tablet 3   spironolactone (ALDACTONE) 25 MG tablet Take 0.5 tablets (12.5 mg total) by mouth every morning. 90 tablet 1   sucralfate (CARAFATE) 1 g tablet Take 1 tablet (1 g total) by mouth 4 (four) times daily as needed (for abdominal discomfort, nausea, and/or vomiting). 30 tablet 0   tiZANidine (ZANAFLEX) 2 MG tablet TAKE 2 TABLETS AT BEDTIME AS NEEDED FOR MUSCLE SPASMS 30 tablet 1   triamcinolone ointment (KENALOG) 0.5 % Apply 1 application topically 2 (two) times daily. Prn Left neck and right inner arm x1-2 weeks prn 60 g 0   Zinc 220 (50 Zn) MG CAPS Take 220 mg by mouth daily. 30 capsule 0   No current facility-administered medications for this visit.    Allergies  Allergen Reactions   Ace Inhibitors Swelling    Angioedema Angioedema Angioedema   Ciprofloxacin Itching and Rash   Fentanyl Itching   Hctz [Hydrochlorothiazide]     hypoK    Latex    Morphine And Related  Itching   Penicillin G Benzathine    Isovue M [Iopamidol] Itching    Pt was given 116mL of Isovue 370 for CT Scan today @ 11:10am. After injection she started itching all over. We gave her 50mG  of Benadryl IV.    Tape Rash    Social History   Socioeconomic History   Marital status: Married    Spouse name: Not on file   Number of children: Not on file   Years of education: Not on file   Highest education level: Not on file  Occupational History   Not on file  Tobacco Use   Smoking status: Never   Smokeless tobacco: Never  Substance and Sexual Activity   Alcohol use: No    Alcohol/week: 0.0 standard drinks   Drug use: No   Sexual activity: Yes  Other Topics Concern   Not on file  Social History Narrative   Not on file   Social Determinants of Health   Financial Resource Strain: Low Risk    Difficulty of Paying Living Expenses: Not hard at all  Food Insecurity: No Food Insecurity   Worried About Charity fundraiser in the Last Year: Never true   [place] in the Last Year: Never true  Transportation Needs: No Transportation Needs   Lack of Transportation (Medical): No   Lack of Transportation (Non-Medical): No  Physical Activity: Not on file  Stress: No Stress Concern Present   Feeling of Stress : Not at all  Social Connections: Unknown   Frequency of Communication with Friends and Family: Not on file   Frequency of Social Gatherings with Friends and Family: Not on file   Attends Religious Services: Not on file   Active Member of Clubs or Organizations: Not on file   Attends Archivist Meetings: Not on file   Marital Status: Married  Human resources officer Violence: Not At Risk   Fear of Current or Ex-Partner: No   Emotionally Abused: No   Physically Abused: No   Sexually Abused: No    Review of Systems  Constitutional: Negative.   HENT: Negative.    Eyes: Negative.   Respiratory:  Positive for cough and sputum production. Negative for hemoptysis,  shortness of breath and wheezing.   Cardiovascular: Negative.   Gastrointestinal: Negative.   Genitourinary: Negative.   Musculoskeletal: Negative.   Skin: Negative.   Neurological: Negative.   Endo/Heme/Allergies:  Negative.   Psychiatric/Behavioral: Negative.    All other systems reviewed and are negative.  Objective   Vitals as reported by the patient: There were no vitals filed for this visit.  [place] was seen today for cough.  Diagnoses and all orders for this visit:  Lower respiratory infection -     azithromycin (ZITHROMAX) 250 MG tablet; Take 2 tablets on day 1, then 1 tablet daily on days 2 through 5 -     predniSONE (STERAPRED UNI-PAK 21 TAB) 10 MG (21) TBPK tablet; Take per package instructions. Do not skip doses. Finish entire supply. -     benzonatate (TESSALON) 200 MG capsule; Take 1 capsule (200 mg total) by mouth 2 (two) times daily as needed for cough. -     promethazine-dextromethorphan (PROMETHAZINE-DM) 6.25-15 MG/5ML syrup; Take 5 mLs by mouth 4 (four) times daily as needed for cough.  History of COVID-19 -     benzonatate (TESSALON) 200 MG capsule; Take 1 capsule (200 mg total) by mouth 2 (two) times daily as needed for cough.    PLAN Given 7 days of symptoms and upcoming colonscopy - tx with abx and prednisone Supportive care with tessalon and promethazine-dextromethorphan syrup as above Reviewed risks, benefits, and alternatives to this plan. Pt voices understanding Reviewed follow up and ER precautions with pt who voices understanding Patient encouraged to call clinic with any questions, comments, or concerns.  I discussed the assessment and treatment plan with the patient. The patient was provided an opportunity to ask questions and all were answered. The patient agreed with the plan and demonstrated an understanding of the instructions.   The patient was advised to call back or seek an in-person evaluation if the symptoms worsen or if the condition  fails to improve as anticipated.  I provided 16 minutes of non-face-to-face time during this encounter.  Maximiano Coss, NP

## 2021-12-21 NOTE — Anesthesia Preprocedure Evaluation (Addendum)
Anesthesia Evaluation  Patient identified by MRN, date of birth, ID band Patient awake    Reviewed: Allergy & Precautions, Patient's Chart, lab work & pertinent test results  Airway Mallampati: II  TM Distance: >3 FB Neck ROM: Full    Dental  (+) Upper Dentures   Pulmonary    Pulmonary exam normal breath sounds clear to auscultation       Cardiovascular hypertension, Normal cardiovascular exam Rhythm:Regular Rate:Normal  HLD   Neuro/Psych  Headaches, PSYCHIATRIC DISORDERS Anxiety Depression    GI/Hepatic GERD  ,  Endo/Other    Renal/GU      Musculoskeletal  (+) Arthritis , Fibromyalgia -  Abdominal   Peds  Hematology   Anesthesia Other Findings   Reproductive/Obstetrics                            Anesthesia Physical Anesthesia Plan  ASA: 3  Anesthesia Plan: General   Post-op Pain Management:    Induction: Intravenous  PONV Risk Score and Plan:   Airway Management Planned: Natural Airway and Nasal Cannula  Additional Equipment:   Intra-op Plan:   Post-operative Plan:   Informed Consent: I have reviewed the patients History and Physical, chart, labs and discussed the procedure including the risks, benefits and alternatives for the proposed anesthesia with the patient or authorized representative who has indicated his/her understanding and acceptance.     Dental Advisory Given  Plan Discussed with: Anesthesiologist, CRNA and Surgeon  Anesthesia Plan Comments: (Patient consented for risks of anesthesia including but not limited to:  - adverse reactions to medications - risk of airway placement if required - damage to eyes, teeth, lips or other oral mucosa - nerve damage due to positioning  - sore throat or hoarseness - Damage to heart, brain, nerves, lungs, other parts of body or loss of life  Patient voiced understanding.)        Anesthesia Quick Evaluation

## 2021-12-22 ENCOUNTER — Ambulatory Visit: Payer: Medicare HMO | Admitting: Anesthesiology

## 2021-12-22 ENCOUNTER — Encounter: Payer: Self-pay | Admitting: Gastroenterology

## 2021-12-22 ENCOUNTER — Encounter
Admission: RE | Disposition: A | Discharge status: Home / Self Care | Payer: Self-pay | Source: Home / Self Care | Attending: Gastroenterology

## 2021-12-22 ENCOUNTER — Ambulatory Visit
Admission: RE | Admit: 2021-12-22 | Discharge: 2021-12-22 | Disposition: A | Discharge status: Home / Self Care | Payer: Medicare HMO | Attending: Gastroenterology | Admitting: Gastroenterology

## 2021-12-22 DIAGNOSIS — I10 Essential (primary) hypertension: Secondary | ICD-10-CM | POA: Insufficient documentation

## 2021-12-22 DIAGNOSIS — E785 Hyperlipidemia, unspecified: Secondary | ICD-10-CM | POA: Insufficient documentation

## 2021-12-22 DIAGNOSIS — F32A Depression, unspecified: Secondary | ICD-10-CM | POA: Diagnosis not present

## 2021-12-22 DIAGNOSIS — M797 Fibromyalgia: Secondary | ICD-10-CM | POA: Insufficient documentation

## 2021-12-22 DIAGNOSIS — K635 Polyp of colon: Secondary | ICD-10-CM | POA: Insufficient documentation

## 2021-12-22 DIAGNOSIS — D122 Benign neoplasm of ascending colon: Secondary | ICD-10-CM

## 2021-12-22 DIAGNOSIS — K219 Gastro-esophageal reflux disease without esophagitis: Secondary | ICD-10-CM | POA: Diagnosis not present

## 2021-12-22 DIAGNOSIS — Z1211 Encounter for screening for malignant neoplasm of colon: Secondary | ICD-10-CM | POA: Insufficient documentation

## 2021-12-22 HISTORY — PX: COLONOSCOPY WITH PROPOFOL: SHX5780

## 2021-12-22 SURGERY — COLONOSCOPY WITH PROPOFOL
Anesthesia: General

## 2021-12-22 MED ORDER — DEXMEDETOMIDINE (PRECEDEX) IN NS 20 MCG/5ML (4 MCG/ML) IV SYRINGE
PREFILLED_SYRINGE | INTRAVENOUS | Status: DC | PRN
  Administered 2021-12-22 (×2): 4 ug via INTRAVENOUS

## 2021-12-22 MED ORDER — LIDOCAINE HCL (CARDIAC) PF 100 MG/5ML IV SOSY
PREFILLED_SYRINGE | INTRAVENOUS | Status: DC | PRN
  Administered 2021-12-22: 100 mg via INTRAVENOUS

## 2021-12-22 MED ORDER — PROPOFOL 10 MG/ML IV BOLUS
INTRAVENOUS | Status: DC | PRN
  Administered 2021-12-22: 30 mg via INTRAVENOUS
  Administered 2021-12-22: 50 mg via INTRAVENOUS
  Administered 2021-12-22: 20 mg via INTRAVENOUS

## 2021-12-22 MED ORDER — PROPOFOL 500 MG/50ML IV EMUL
INTRAVENOUS | Status: DC | PRN
  Administered 2021-12-22: 140 ug/kg/min via INTRAVENOUS

## 2021-12-22 MED ORDER — SODIUM CHLORIDE 0.9 % IV SOLN: INTRAVENOUS | Status: DC

## 2021-12-22 NOTE — H&P (Signed)
Jonathon Bellows, MD [address], [place], [place], [place], [address] [address], [place], [place], [place], [address] Phone: [phone]  Fax: [phone]  Primary Care Physician:  Leone Haven, MD   Pre-Procedure History & Physical: HPI:  Maria Hamilton is a 71 y.o. female is here for an colonoscopy.   Past Medical History:  Diagnosis Date   Acute anxiety 08/03/2015   Overview:  Last Assessment & Plan:  Klonopin most helpful  Reports past taking it 1 tab am 1/2 tab midday and 1 tab pm  30 day supply faxed to pharmacy   Acute bronchitis 12/31/2016   Acute pancreatitis 10/14/2016   Altered mental status 10/12/2016   Anxiety    Bronchitis    Chronic hip pain    Chronic pain syndrome    Depression    Edema    Fatigue    Fibromyalgia    GERD (gastroesophageal reflux disease)    Headache    Hypertension    Insomnia    Low back pain    Right upper quadrant abdominal pain    Vitamin D deficiency     Past Surgical History:  Procedure Laterality Date   ABDOMINAL HYSTERECTOMY     BACK SURGERY     BREAST BIOPSY Left 90s   benign   BREAST SURGERY     CHOLECYSTECTOMY N/A 10/16/2016   Procedure: LAPAROSCOPIC CHOLECYSTECTOMY WITH INTRAOPERATIVE CHOLANGIOGRAM;  Surgeon: Dia Crawford III, MD;  Location: [facility] ORS;  Service: General;  Laterality: N/A;   cyst removal from wrist     ECTOPIC PREGNANCY SURGERY     HIP SURGERY     x4   OOPHORECTOMY      Prior to Admission medications   Medication Sig Start Date End Date Taking? Authorizing Provider  amLODipine (NORVASC) 10 MG tablet TAKE 1 TABLET EVERY DAY 05/22/21  Yes Leone Haven, MD  carvedilol (COREG) 12.5 MG tablet TAKE 1 TABLET TWICE DAILY WITH A MEAL 05/29/21  Yes Leone Haven, MD  cholecalciferol (VITAMIN D) 1000 units tablet Take 1,000 Units by mouth daily.   Yes [provider]  escitalopram (LEXAPRO) 20 MG tablet TAKE 1 TABLET EVERY DAY 08/02/21  Yes Leone Haven, MD  gabapentin  (NEURONTIN) 600 MG tablet Take 0.5 tablets (300 mg total) by mouth every morning AND 1 tablet (600 mg total) at bedtime. 12/01/21  Yes Leone Haven, MD  sucralfate (CARAFATE) 1 g tablet Take 1 tablet (1 g total) by mouth 4 (four) times daily as needed (for abdominal discomfort, nausea, and/or vomiting). 04/06/21  Yes Hinda Kehr, MD  albuterol (VENTOLIN HFA) 108 (90 Base) MCG/ACT inhaler Inhale 2 puffs into the lungs every 6 (six) hours as needed for wheezing or shortness of breath. Patient not taking: Reported on 12/22/2021 12/16/20   [place] Donnamarie Poag, DO  benzonatate (TESSALON) 200 MG capsule Take 1 capsule (200 mg total) by mouth 2 (two) times daily as needed for cough. 12/14/21   Maximiano Coss, NP  calcium carbonate (OSCAL) 1500 (600 Ca) MG TABS tablet Take 600 mg of elemental calcium by mouth daily with breakfast.    [provider]  guaiFENesin-dextromethorphan (ROBITUSSIN DM) 100-10 MG/5ML syrup Take 5 mLs by mouth every 4 (four) hours as needed for cough. 12/16/20   [place] Donnamarie Poag, DO  omeprazole (PRILOSEC OTC) 20 MG tablet Take 1 tablet (20 mg total) by mouth daily. 04/06/21 04/06/22  Hinda Kehr, MD  potassium chloride SA (KLOR-CON) 20 MEQ  tablet TAKE 1 TABLET EVERY DAY 05/29/21   Leone Haven, MD  predniSONE (STERAPRED UNI-PAK 21 TAB) 10 MG (21) TBPK tablet Take per package instructions. Do not skip doses. Finish entire supply. 12/14/21   Maximiano Coss, NP  promethazine-dextromethorphan (PROMETHAZINE-DM) 6.25-15 MG/5ML syrup Take 5 mLs by mouth 4 (four) times daily as needed for cough. 12/14/21   Maximiano Coss, NP  rosuvastatin (CRESTOR) 20 MG tablet TAKE 1 TABLET EVERY DAY 05/29/21   Leone Haven, MD  spironolactone (ALDACTONE) 25 MG tablet Take 0.5 tablets (12.5 mg total) by mouth every morning. 07/11/21   Leone Haven, MD  tiZANidine (ZANAFLEX) 2 MG tablet TAKE 2 TABLETS AT BEDTIME AS NEEDED FOR MUSCLE SPASMS 12/13/21   Leone Haven, MD   triamcinolone ointment (KENALOG) 0.5 % Apply 1 application topically 2 (two) times daily. Prn Left neck and right inner arm x1-2 weeks prn 07/20/20   McLean-Scocuzza, Nino Glow, MD  Zinc 220 (50 Zn) MG CAPS Take 220 mg by mouth daily. 12/16/20   [place] Donnamarie Poag, DO    Allergies as of 12/12/2021 - Review Complete 12/01/2021  Allergen Reaction Noted   Ace inhibitors Swelling 04/15/2016   Ciprofloxacin Itching and Rash 10/15/2016   Fentanyl Itching 01/09/2017   Hctz [hydrochlorothiazide]  01/12/2020   Latex  02/22/2016   Morphine and related Itching 10/23/2016   Penicillin g benzathine  08/03/2015   Isovue m [iopamidol] Itching 09/18/2017   Tape Rash 09/27/2015    Family History  Problem Relation Age of Onset   Heart disease Mother    Cancer Father    Alcohol abuse Father     Social History   Socioeconomic History   Marital status: Married    Spouse name: Not on file   Number of children: Not on file   Years of education: Not on file   Highest education level: Not on file  Occupational History   Not on file  Tobacco Use   Smoking status: Never   Smokeless tobacco: Never  Vaping Use   Vaping Use: Never used  Substance and Sexual Activity   Alcohol use: No    Alcohol/week: 0.0 standard drinks   Drug use: No   Sexual activity: Yes  Other Topics Concern   Not on file  Social History Narrative   Not on file   Social Determinants of Health   Financial Resource Strain: Low Risk    Difficulty of Paying Living Expenses: Not hard at all  Food Insecurity: No Food Insecurity   Worried About Charity fundraiser in the Last Year: Never true   Arboriculturist in the Last Year: Never true  Transportation Needs: No Transportation Needs   Lack of Transportation (Medical): No   Lack of Transportation (Non-Medical): No  Physical Activity: Not on file  Stress: No Stress Concern Present   Feeling of Stress : Not at all  Social Connections: Unknown   Frequency of Communication with  Friends and Family: Not on file   Frequency of Social Gatherings with Friends and Family: Not on file   Attends Religious Services: Not on file   Active Member of Clubs or Organizations: Not on file   Attends Archivist Meetings: Not on file   Marital Status: Married  Human resources officer Violence: Not At Risk   Fear of Current or Ex-Partner: No   Emotionally Abused: No   Physically Abused: No   Sexually Abused: No    Review of Systems: See  HPI, otherwise negative ROS  Physical Exam: BP (!) 163/91    Pulse 64    Temp (!) 96.5 F (35.8 C) (Temporal)    Resp 18    Ht 6\' 1"  (1.854 m)    Wt 92.5 kg    SpO2 100%    BMI 26.91 kg/m  General:   Alert,  pleasant and cooperative in NAD Head:  Normocephalic and atraumatic. Neck:  Supple; no masses or thyromegaly. Lungs:  Clear throughout to auscultation, normal respiratory effort.    Heart:  +S1, +S2, Regular rate and rhythm, No edema. Abdomen:  Soft, nontender and nondistended. Normal bowel sounds, without guarding, and without rebound.   Neurologic:  Alert and  oriented x4;  grossly normal neurologically.  Impression/Plan: Maria Hamilton is here for an colonoscopy to be performed for Screening colonoscopy average risk   Risks, benefits, limitations, and alternatives regarding  colonoscopy have been reviewed with the patient.  Questions have been answered.  All parties agreeable.   Jonathon Bellows, MD  12/22/2021, 9:42 AM

## 2021-12-22 NOTE — Transfer of Care (Signed)
Immediate Anesthesia Transfer of Care Note  [name]  Procedure(s) Performed: COLONOSCOPY WITH PROPOFOL  Patient Location: Endoscopy Unit  Anesthesia Type:General  Level of Consciousness: sedated  Airway & Oxygen Therapy: Patient Spontanous Breathing  Post-op Assessment: Report given to RN and Post -op Vital signs reviewed and stable  Post vital signs: Reviewed and stable  Last Vitals:  Vitals Value Taken Time  BP 95/52 12/22/21 1020  Temp 36.1 C 12/22/21 1019  Pulse 65 12/22/21 1021  Resp 13 12/22/21 1021  SpO2 97 % 12/22/21 1021  Vitals shown include unvalidated device data.  Last Pain:  Vitals:   12/22/21 1019  TempSrc: Temporal  PainSc: Asleep         Complications: No notable events documented.

## 2021-12-22 NOTE — Op Note (Signed)
[facility] Gastroenterology Patient Name: Maria Hamilton Procedure Date: 12/22/2021 9:42 AM MRN: [id number] Account #: [id number] Date of Birth: [birth year] Admit Type: Outpatient Age: 71 Room: [facility] ENDO ROOM 4 Gender: Female Note Status: Finalized Instrument Name: Prentice Docker [id number] Procedure:             Colonoscopy Indications:           Screening for colorectal malignant neoplasm Providers:             Wyline Mood MD, MD Referring MD:          Yehuda Mao. Birdie Sons (Referring MD) Medicines:             Monitored Anesthesia Care Complications:         No immediate complications. Procedure:             Pre-Anesthesia Assessment:                        - Prior to the procedure, a History and Physical was                         performed, and patient medications, allergies and                         sensitivities were reviewed. The patient's tolerance                         of previous anesthesia was reviewed.                        - The risks and benefits of the procedure and the                         sedation options and risks were discussed with the                         patient. All questions were answered and informed                         consent was obtained.                        - ASA Grade Assessment: II - A patient with mild                         systemic disease.                        After obtaining informed consent, the colonoscope was                         passed under direct vision. Throughout the procedure,                         the patient's blood pressure, pulse, and oxygen                         saturations were monitored continuously. The                         Colonoscope was introduced  through the anus and                         advanced to the the cecum, identified by the                         appendiceal orifice. The colonoscopy was performed                         with ease. The patient tolerated the procedure well.                          The quality of the bowel preparation was inadequate. Findings:      The perianal and digital rectal examinations were normal.      A 5 mm polyp was found in the ascending colon. The polyp was sessile.       The polyp was removed with a cold snare. Resection and retrieval were       complete.      A large amount of semi-liquid stool was found in the entire colon,       interfering with visualization. Impression:            - Preparation of the colon was inadequate.                        - One 5 mm polyp in the ascending colon, removed with                         a cold snare. Resected and retrieved.                        - Stool in the entire examined colon. Recommendation:        - Discharge patient to home (with escort).                        - Resume previous diet.                        - Continue present medications.                        - Await pathology results.                        - Repeat colonoscopy in 4 weeks because the bowel                         preparation was suboptimal. Procedure Code(s):     --- Professional ---                        [phone], Colonoscopy, flexible; with removal of                         tumor(s), polyp(s), or other lesion(s) by snare                         technique Diagnosis Code(s):     --- Professional ---  Z12.11, Encounter for screening for malignant neoplasm                         of colon                        K63.5, Polyp of colon CPT copyright 2019 American Medical Association. All rights reserved. The codes documented in this report are preliminary and upon coder review may  be revised to meet current compliance requirements. Wyline Mood, MD Wyline Mood MD, MD 12/22/2021 10:17:47 AM This report has been signed electronically. Number of Addenda: 0 Note Initiated On: 12/22/2021 9:42 AM Scope Withdrawal Time: 0 hours 2 minutes 24 seconds  Total Procedure Duration: 0 hours 13 minutes 44  seconds  Estimated Blood Loss:  Estimated blood loss: none.      [facility]

## 2021-12-22 NOTE — Anesthesia Postprocedure Evaluation (Signed)
Anesthesia Post Note  [name]  Procedure(s) Performed: COLONOSCOPY WITH PROPOFOL  Anesthesia Type: General Anesthetic complications: no   There were no known notable events for this encounter.   Last Vitals:  Vitals:   12/22/21 1019 12/22/21 1039  BP: (!) 95/52 (!) 153/95  Pulse:    Resp:    Temp: (!) 36.1 C   SpO2:      Last Pain:  Vitals:   12/22/21 1039  TempSrc:   PainSc: 0-No pain                 [facility]

## 2021-12-22 NOTE — Anesthesia Procedure Notes (Signed)
Date/Time: 12/22/2021 10:09 AM Performed by: Lily Peer, Zeah Germano, CRNA Pre-anesthesia Checklist: Patient identified, Emergency Drugs available, Suction available, Patient being monitored and Timeout performed Patient Re-evaluated:Patient Re-evaluated prior to induction Oxygen Delivery Method: Nasal cannula Induction Type: IV induction

## 2021-12-23 ENCOUNTER — Other Ambulatory Visit: Payer: Self-pay | Admitting: Family Medicine

## 2021-12-25 ENCOUNTER — Encounter: Payer: Self-pay | Admitting: Gastroenterology

## 2021-12-25 LAB — SURGICAL PATHOLOGY

## 2021-12-27 ENCOUNTER — Telehealth: Payer: Self-pay

## 2021-12-27 NOTE — Telephone Encounter (Signed)
Called patient to schedule colonoscopy for 4 weeks as recommended from dr Vicente Males patient refused and stated she will call us back when she is ready to schedule

## 2022-01-03 ENCOUNTER — Other Ambulatory Visit: Payer: Self-pay | Admitting: Family Medicine

## 2022-01-03 DIAGNOSIS — M62838 Other muscle spasm: Secondary | ICD-10-CM

## 2022-01-17 ENCOUNTER — Ambulatory Visit: Payer: Medicare HMO | Admitting: Family

## 2022-01-19 NOTE — Progress Notes (Signed)
Inform polyp lost during retrieval due to large qty of stool - as suggested repeat colonoscopy with 2 day prep

## 2022-01-26 ENCOUNTER — Ambulatory Visit (INDEPENDENT_AMBULATORY_CARE_PROVIDER_SITE_OTHER): Payer: Medicare HMO

## 2022-01-26 VITALS — Ht 73.0 in | Wt 204.0 lb

## 2022-01-26 DIAGNOSIS — Z0000 Encounter for general adult medical examination without abnormal findings: Secondary | ICD-10-CM

## 2022-01-26 NOTE — Patient Instructions (Addendum)
°  [name] , Thank you for taking time to come for your Medicare Wellness Visit. I appreciate your ongoing commitment to your health goals. Please review the following plan we discussed and let me know if I can assist you in the future.   These are the goals we discussed:  Goals      Prevent falls     Do not stand on chairs when reaching for objects.  Pace self when walking.          This is a list of the screening recommended for you and due dates:  Health Maintenance  Topic Date Due   DEXA scan (bone density measurement)  Never done   Mammogram  05/06/2020   COVID-19 Vaccine (1) 02/11/2022*   Flu Shot  03/16/2022*   Zoster (Shingles) Vaccine (1 of 2) 04/25/2022*   Pneumonia Vaccine (2 - PCV) 01/26/2023*   Tetanus Vaccine  06/10/2022   Colon Cancer Screening  12/23/2031   Hepatitis C Screening: USPSTF Recommendation to screen - Ages 18-79 yo.  Completed   HPV Vaccine  Aged Out  *Topic was postponed. The date shown is not the original due date.

## 2022-01-26 NOTE — Progress Notes (Signed)
Subjective:   Maria Hamilton is a 71 y.o. female who presents for Medicare Annual (Subsequent) preventive examination.  Review of Systems    No ROS.  Medicare Wellness Virtual Visit.  Visual/audio telehealth visit, UTA vital signs.   See social history for additional risk factors.   Cardiac Risk Factors include: advanced age (>65men, >61 women);hypertension     Objective:    Today's Vitals   01/26/22 1243  Weight: 204 lb (92.5 kg)  Height: 6\' 1"  (1.854 m)   Body mass index is 26.91 kg/m.  Advanced Directives 01/26/2022 12/22/2021 04/05/2021 01/25/2021 12/12/2020 12/12/2020 01/25/2020  Does Patient Have a Medical Advance Directive? No No No No No No Yes  Type of Advance Directive - - - - - - Living will;Healthcare Power of Attorney  Does patient want to make changes to medical advance directive? - - - - - - No - Patient declined  Copy of [place] in Chart? - - - - - - No - copy requested  Would patient like information on creating a medical advance directive? No - Patient declined - - No - Patient declined No - Patient declined No - Patient declined -    Current Medications (verified) Outpatient Encounter Medications as of 01/26/2022  Medication Sig   albuterol (VENTOLIN HFA) 108 (90 Base) MCG/ACT inhaler Inhale 2 puffs into the lungs every 6 (six) hours as needed for wheezing or shortness of breath. (Patient not taking: Reported on 12/22/2021)   amLODipine (NORVASC) 10 MG tablet TAKE 1 TABLET EVERY DAY   benzonatate (TESSALON) 200 MG capsule Take 1 capsule (200 mg total) by mouth 2 (two) times daily as needed for cough.   calcium carbonate (OSCAL) 1500 (600 Ca) MG TABS tablet Take 600 mg of elemental calcium by mouth daily with breakfast.   carvedilol (COREG) 12.5 MG tablet TAKE 1 TABLET TWICE DAILY WITH A MEAL   cholecalciferol (VITAMIN D) 1000 units tablet Take 1,000 Units by mouth daily.   escitalopram (LEXAPRO) 20 MG tablet TAKE 1 TABLET EVERY DAY    gabapentin (NEURONTIN) 600 MG tablet TAKE 1 TABLET AT BEDTIME   guaiFENesin-dextromethorphan (ROBITUSSIN DM) 100-10 MG/5ML syrup Take 5 mLs by mouth every 4 (four) hours as needed for cough.   omeprazole (PRILOSEC OTC) 20 MG tablet Take 1 tablet (20 mg total) by mouth daily.   potassium chloride SA (KLOR-CON) 20 MEQ tablet TAKE 1 TABLET EVERY DAY   predniSONE (STERAPRED UNI-PAK 21 TAB) 10 MG (21) TBPK tablet Take per package instructions. Do not skip doses. Finish entire supply.   promethazine-dextromethorphan (PROMETHAZINE-DM) 6.25-15 MG/5ML syrup Take 5 mLs by mouth 4 (four) times daily as needed for cough.   rosuvastatin (CRESTOR) 20 MG tablet TAKE 1 TABLET EVERY DAY   spironolactone (ALDACTONE) 25 MG tablet Take 0.5 tablets (12.5 mg total) by mouth every morning.   sucralfate (CARAFATE) 1 g tablet Take 1 tablet (1 g total) by mouth 4 (four) times daily as needed (for abdominal discomfort, nausea, and/or vomiting).   tiZANidine (ZANAFLEX) 2 MG tablet TAKE 2 TABLETS AT BEDTIME AS NEEDED FOR MUSCLE SPASMS   triamcinolone ointment (KENALOG) 0.5 % Apply 1 application topically 2 (two) times daily. Prn Left neck and right inner arm x1-2 weeks prn   Zinc 220 (50 Zn) MG CAPS Take 220 mg by mouth daily.   No facility-administered encounter medications on file as of 01/26/2022.    Allergies (verified) Ace inhibitors, Ciprofloxacin, Fentanyl, Hctz [hydrochlorothiazide], Latex, Morphine and related, Penicillin  g benzathine, Isovue m [iopamidol], and Tape   History: Past Medical History:  Diagnosis Date   Acute anxiety 08/03/2015   Overview:  Last Assessment & Plan:  Klonopin most helpful  Reports past taking it 1 tab am 1/2 tab midday and 1 tab pm  30 day supply faxed to pharmacy   Acute bronchitis 12/31/2016   Acute pancreatitis 10/14/2016   Altered mental status 10/12/2016   Anxiety    Bronchitis    Chronic hip pain    Chronic pain syndrome    Depression    Edema    Fatigue    Fibromyalgia     GERD (gastroesophageal reflux disease)    Headache    Hypertension    Insomnia    Low back pain    Right upper quadrant abdominal pain    Vitamin D deficiency    Past Surgical History:  Procedure Laterality Date   ABDOMINAL HYSTERECTOMY     BACK SURGERY     BREAST BIOPSY Left 90s   benign   BREAST SURGERY     CHOLECYSTECTOMY N/A 10/16/2016   Procedure: LAPAROSCOPIC CHOLECYSTECTOMY WITH INTRAOPERATIVE CHOLANGIOGRAM;  Surgeon: Dia Crawford III, MD;  Location: [facility] ORS;  Service: General;  Laterality: N/A;   COLONOSCOPY WITH PROPOFOL N/A 12/22/2021   Procedure: COLONOSCOPY WITH PROPOFOL;  Surgeon: Jonathon Bellows, MD;  Location: [facility] ENDOSCOPY;  Service: Gastroenterology;  Laterality: N/A;   cyst removal from wrist     ECTOPIC PREGNANCY SURGERY     HIP SURGERY     x4   OOPHORECTOMY     Family History  Problem Relation Age of Onset   Heart disease Mother    Cancer Father    Alcohol abuse Father    Social History   Socioeconomic History   Marital status: Married    Spouse name: Not on file   Number of children: Not on file   Years of education: Not on file   Highest education level: Not on file  Occupational History   Not on file  Tobacco Use   Smoking status: Never   Smokeless tobacco: Never  Vaping Use   Vaping Use: Never used  Substance and Sexual Activity   Alcohol use: No    Alcohol/week: 0.0 standard drinks   Drug use: No   Sexual activity: Yes  Other Topics Concern   Not on file  Social History Narrative   Not on file   Social Determinants of Health   Financial Resource Strain: Not on file  Food Insecurity: Not on file  Transportation Needs: Not on file  Physical Activity: Not on file  Stress: Not on file  Social Connections: Not on file    Tobacco Counseling Counseling given: Not Answered   Clinical Intake:  Pre-visit preparation completed: Yes        Diabetes: No  How often do you need to have someone help you when you read instructions,  pamphlets, or other written materials from your doctor or pharmacy?: 1 - Never   Interpreter Needed?: No      Activities of Daily Living In your present state of health, do you have any difficulty performing the following activities: 01/26/2022  Hearing? N  Vision? N  Difficulty concentrating or making decisions? (No Data)  Comment Dx memory difficulty  Walking or climbing stairs? N  Dressing or bathing? N  Doing errands, shopping? N  Preparing Food and eating ? N  Using the Toilet? N  In the past six months, have you  accidently leaked urine? N  Do you have problems with loss of bowel control? N  Managing your Medications? N  Comment Pill box in use  Managing your Finances? N  Comment Husband assist as needed  Housekeeping or managing your Housekeeping? N  Some recent data might be hidden    Patient Care Team: Leone Haven, MD as PCP - General (Family Medicine)  Indicate any recent Medical Services you may have received from other than [facility] providers in the past year (date may be approximate).     Assessment:   This is a routine wellness examination for [place].  Virtual Visit via Telephone Note  I connected with  Kathlene Cote on 01/26/22 at 12:30 PM EST by telephone and verified that I am speaking with the correct person using two identifiers.  Persons participating in the virtual visit: patient/Nurse Health Advisor   I discussed the limitations, risks, security and privacy concerns of performing an evaluation and management service by telephone and the availability of in person appointments. The patient expressed understanding and agreed to proceed.  Interactive audio and video telecommunications were attempted between this nurse and patient, however failed, due to patient having technical difficulties OR patient did not have access to video capability.  We continued and completed visit with audio only.  Some vital signs may be absent or patient reported.    Hearing/Vision screen Hearing Screening - Comments:: Patient is able to hear conversational tones without difficulty. No issues reported. Vision Screening - Comments:: Wears corrective lenses. They have seen their ophthalmologist.   Dietary issues and exercise activities discussed: Current Exercise Habits: Home exercise routine, Type of exercise: walking, Intensity: Mild Healthy diet Good water intake   Goals Addressed             This Visit's Progress    Prevent falls   On track    Do not stand on chairs when reaching for objects.  Pace self when walking.         Depression Screen PHQ 2/9 Scores 12/14/2021 01/25/2021 01/02/2021 05/02/2020 01/25/2020 01/12/2020 08/07/2019  PHQ - 2 Score 0 0 0 0 0 0 0  Exception Documentation - - - - - - -    Fall Risk Fall Risk  01/26/2022 12/14/2021 10/31/2020 07/20/2020 05/02/2020  Falls in the past year? 0 0 0 1 1  Number falls in past yr: 0 0 0 1 1  Injury with Fall? - 0 - 0 0  Comment - - - - -  Risk for fall due to : - No Fall Risks - - -  Follow up Falls evaluation completed Falls evaluation completed Falls evaluation completed Falls evaluation completed Falls evaluation completed    [place]: Home free of loose throw rugs in walkways, pet beds, electrical cords, etc? Yes  Adequate lighting in your home to reduce risk of falls? Yes   ASSISTIVE DEVICES UTILIZED TO PREVENT FALLS: Life alert? No  Use of a cane, walker or w/c? No   TIMED UP AND GO: Was the test performed? No .   Cognitive Function:  Patient is alert x3.   6CIT Screen 12/01/2021 01/25/2021 01/25/2020  What Year? 0 points 0 points 0 points  What month? - 0 points 0 points  What time? - 0 points 0 points  Count back from 20 - 0 points 0 points  Months in reverse - 0 points 2 points    Immunizations Immunization History  Administered Date(s) Administered  Influenza, High Dose Seasonal PF 10/16/2017   Influenza,inj,Quad PF,6+ Mos  10/17/2016   Influenza-Unspecified 10/29/2012   Pneumococcal Polysaccharide-23 10/17/2016   Td 09/09/2001   Tdap 06/10/2012   TDAP status: Due, Education has been provided regarding the importance of this vaccine. Advised may receive this vaccine at local pharmacy or Health Dept. Aware to provide a copy of the vaccination record if obtained from local pharmacy or Health Dept. Verbalized acceptance and understanding.Deferred.   Flu Vaccine status: Declined, Education has been provided regarding the importance of this vaccine but patient still declined. Advised may receive this vaccine at local pharmacy or Health Dept. Aware to provide a copy of the vaccination record if obtained from local pharmacy or Health Dept. Verbalized acceptance and understanding.  Pneumococcal vaccine status: Declined,  Education has been provided regarding the importance of this vaccine but patient still declined. Advised may receive this vaccine at local pharmacy or Health Dept. Aware to provide a copy of the vaccination record if obtained from local pharmacy or Health Dept. Verbalized acceptance and understanding.   Covid-19 vaccine status: Declined, Education has been provided regarding the importance of this vaccine but patient still declined. Advised may receive this vaccine at local pharmacy or Health Dept.or vaccine clinic. Aware to provide a copy of the vaccination record if obtained from local pharmacy or Health Dept. Verbalized acceptance and understanding.  Shingrix Completed?: No.    Education has been provided regarding the importance of this vaccine. Patient has been advised to call insurance company to determine out of pocket expense if they have not yet received this vaccine. Advised may also receive vaccine at local pharmacy or Health Dept. Verbalized acceptance and understanding.  Screening Tests Health Maintenance  Topic Date Due   DEXA SCAN  Never done   MAMMOGRAM  05/06/2020   COVID-19 Vaccine (1)  02/11/2022 (Originally 10/08/1951)   INFLUENZA VACCINE  03/16/2022 (Originally 07/17/2021)   Zoster Vaccines- Shingrix (1 of 2) 04/25/2022 (Originally 04/07/2001)   Pneumonia Vaccine 60+ Years old (2 - PCV) 01/26/2023 (Originally 10/17/2017)   TETANUS/TDAP  06/10/2022   COLONOSCOPY (Pts 45-77yrs Insurance coverage will need to be confirmed)  12/23/2031   Hepatitis C Screening  Completed   HPV VACCINES  Aged Out   Health Maintenance Health Maintenance Due  Topic Date Due   DEXA SCAN  Never done   MAMMOGRAM  05/06/2020   Mammogram- plans to schedule  Lung Cancer Screening: (Low Dose CT Chest recommended if Age 56-80 years, 30 pack-year currently smoking OR have quit w/in 15years.) does not qualify.   Vision Screening: Recommended annual ophthalmology exams for early detection of glaucoma and other disorders of the eye.  Dental Screening: Recommended annual dental exams for proper oral hygiene  Community Resource Referral / Chronic Care Management: CRR required this visit?  No   CCM required this visit?  No      Plan:   Keep all routine maintenance appointments.   I have personally reviewed and noted the following in the patients chart:   Medical and social history Use of alcohol, tobacco or illicit drugs  Current medications and supplements including opioid prescriptions.  Functional ability and status Nutritional status Physical activity Advanced directives List of other physicians Hospitalizations, surgeries, and ER visits in previous 12 months Vitals Screenings to include cognitive, depression, and falls Referrals and appointments  In addition, I have reviewed and discussed with patient certain preventive protocols, quality metrics, and best practice recommendations. A written personalized care plan for preventive services  as well as general preventive health recommendations were provided to patient via mychart.     Varney Biles, LPN   [id number]

## 2022-02-05 ENCOUNTER — Encounter: Payer: Self-pay | Admitting: Family Medicine

## 2022-02-05 ENCOUNTER — Other Ambulatory Visit: Payer: Self-pay

## 2022-02-05 ENCOUNTER — Ambulatory Visit (INDEPENDENT_AMBULATORY_CARE_PROVIDER_SITE_OTHER): Payer: Medicare HMO | Admitting: Family Medicine

## 2022-02-05 DIAGNOSIS — J069 Acute upper respiratory infection, unspecified: Secondary | ICD-10-CM

## 2022-02-05 MED ORDER — HYDROCOD POLI-CHLORPHE POLI ER 10-8 MG/5ML PO SUER: 5.0000 mL | Freq: Two times a day (BID) | ORAL | 0 refills | Status: DC | PRN

## 2022-02-05 NOTE — Progress Notes (Signed)
Virtual Visit via telephone Note  This visit type was conducted due to national recommendations for restrictions regarding the COVID-19 pandemic (e.g. social distancing).  This format is felt to be most appropriate for this patient at this time.  All issues noted in this document were discussed and addressed.  No physical exam was performed (except for noted visual exam findings with Video Visits).   I connected with Maria Hamilton today at  3:15 PM EST by telephone and verified that I am speaking with the correct person using two identifiers. Location patient: home Location provider: work  Persons participating in the virtual visit: patient, provider  I discussed the limitations, risks, security and privacy concerns of performing an evaluation and management service by telephone and the availability of in person appointments. I also discussed with the patient that there may be a patient responsible charge related to this service. The patient expressed understanding and agreed to proceed.  Interactive audio and video telecommunications were attempted between this provider and patient, however failed, due to patient having technical difficulties OR patient did not have access to video capability.  We continued and completed visit with audio only.   Reason for visit: same day visit  HPI: Upper respiratory infection: Patient notes onset of symptoms about a week ago.  Her grandchildren were sick with similar symptoms.  She notes they tested negative for COVID.  She has cough with some dark mucus at times.  She is not coughing all the time.  She has had some nasal congestion though not much sinus congestion.  She is not getting much out of her nose.  No fevers or postnasal drip.  Some sore throat.  No shortness of breath, taste disturbance, or smell disturbance.  She notes her chest is sore from coughing.  She notes if it was not for the cough she would be feeling quite well.  No COVID exposures.   She is not vaccinated against COVID-19.  She has tried TheraFlu with no benefit.  She notes in the past Robitussin-DM and Promethazine DM have not been helpful.  Tessalon was too expensive.   ROS: See pertinent positives and negatives per HPI.  Past Medical History:  Diagnosis Date   Acute anxiety 08/03/2015   Overview:  Last Assessment & Plan:  Klonopin most helpful  Reports past taking it 1 tab am 1/2 tab midday and 1 tab pm  30 day supply faxed to pharmacy   Acute bronchitis 12/31/2016   Acute pancreatitis 10/14/2016   Altered mental status 10/12/2016   Anxiety    Bronchitis    Chronic hip pain    Chronic pain syndrome    Depression    Edema    Fatigue    Fibromyalgia    GERD (gastroesophageal reflux disease)    Headache    Hypertension    Insomnia    Low back pain    Right upper quadrant abdominal pain    Vitamin D deficiency     Past Surgical History:  Procedure Laterality Date   ABDOMINAL HYSTERECTOMY     BACK SURGERY     BREAST BIOPSY Left 90s   benign   BREAST SURGERY     CHOLECYSTECTOMY N/A 10/16/2016   Procedure: LAPAROSCOPIC CHOLECYSTECTOMY WITH INTRAOPERATIVE CHOLANGIOGRAM;  Surgeon: Dia Crawford III, MD;  Location: [facility] ORS;  Service: General;  Laterality: N/A;   COLONOSCOPY WITH PROPOFOL N/A 12/22/2021   Procedure: COLONOSCOPY WITH PROPOFOL;  Surgeon: Jonathon Bellows, MD;  Location: [facility] ENDOSCOPY;  Service: Gastroenterology;  Laterality: N/A;   cyst removal from wrist     ECTOPIC PREGNANCY SURGERY     HIP SURGERY     x4   OOPHORECTOMY      Family History  Problem Relation Age of Onset   Heart disease Mother    Cancer Father    Alcohol abuse Father     SOCIAL HX: Non-smoker   Current Outpatient Medications:    albuterol (VENTOLIN HFA) 108 (90 Base) MCG/ACT inhaler, Inhale 2 puffs into the lungs every 6 (six) hours as needed for wheezing or shortness of breath., Disp: 8 g, Rfl: 2   amLODipine (NORVASC) 10 MG tablet, TAKE 1 TABLET EVERY DAY, Disp: 90  tablet, Rfl: 2   calcium carbonate (OSCAL) 1500 (600 Ca) MG TABS tablet, Take 600 mg of elemental calcium by mouth daily with breakfast., Disp: , Rfl:    carvedilol (COREG) 12.5 MG tablet, TAKE 1 TABLET TWICE DAILY WITH A MEAL, Disp: 180 tablet, Rfl: 1   chlorpheniramine-HYDROcodone (TUSSIONEX PENNKINETIC ER) 10-8 MG/5ML, Take 5 mLs by mouth every 12 (twelve) hours as needed for cough., Disp: 115 mL, Rfl: 0   cholecalciferol (VITAMIN D) 1000 units tablet, Take 1,000 Units by mouth daily., Disp: , Rfl:    escitalopram (LEXAPRO) 20 MG tablet, TAKE 1 TABLET EVERY DAY, Disp: 90 tablet, Rfl: 1   gabapentin (NEURONTIN) 600 MG tablet, TAKE 1 TABLET AT BEDTIME, Disp: 90 tablet, Rfl: 0   omeprazole (PRILOSEC OTC) 20 MG tablet, Take 1 tablet (20 mg total) by mouth daily., Disp: 28 tablet, Rfl: 0   potassium chloride SA (KLOR-CON) 20 MEQ tablet, TAKE 1 TABLET EVERY DAY, Disp: 90 tablet, Rfl: 1   predniSONE (STERAPRED UNI-PAK 21 TAB) 10 MG (21) TBPK tablet, Take per package instructions. Do not skip doses. Finish entire supply., Disp: 1 each, Rfl: 0   rosuvastatin (CRESTOR) 20 MG tablet, TAKE 1 TABLET EVERY DAY, Disp: 90 tablet, Rfl: 3   spironolactone (ALDACTONE) 25 MG tablet, Take 0.5 tablets (12.5 mg total) by mouth every morning., Disp: 90 tablet, Rfl: 1   sucralfate (CARAFATE) 1 g tablet, Take 1 tablet (1 g total) by mouth 4 (four) times daily as needed (for abdominal discomfort, nausea, and/or vomiting)., Disp: 30 tablet, Rfl: 0   tiZANidine (ZANAFLEX) 2 MG tablet, TAKE 2 TABLETS AT BEDTIME AS NEEDED FOR MUSCLE SPASMS, Disp: 30 tablet, Rfl: 1   triamcinolone ointment (KENALOG) 0.5 %, Apply 1 application topically 2 (two) times daily. Prn Left neck and right inner arm x1-2 weeks prn, Disp: 60 g, Rfl: 0   Zinc 220 (50 Zn) MG CAPS, Take 220 mg by mouth daily., Disp: 30 capsule, Rfl: 0  EXAM: This was a telephone visit and thus no exam was completed.  ASSESSMENT AND PLAN:  Discussed the following  assessment and plan:  Problem List Items Addressed This Visit     Viral URI    Symptoms are consistent with a viral URI.  Discussed treatment of her cough.  Discussed that antibiotics would not play a role in treatment unless she worsened.  Advised that she would likely continue to improve.  Discussed cough medication and we will treat with Tussionex.  Advised that this could make her drowsy and if it does make her drowsy she should not drive.  Discussed trying to limit this to nighttime use.  Discussed risk of respiratory depression with this medication.  Advised if she develops shortness of breath she needs to seek medical attention.  If she is not  continuing to improve she will let us know.       Return if symptoms worsen or fail to improve.   I discussed the assessment and treatment plan with the patient. The patient was provided an opportunity to ask questions and all were answered. The patient agreed with the plan and demonstrated an understanding of the instructions.   The patient was advised to call back or seek an in-person evaluation if the symptoms worsen or if the condition fails to improve as anticipated.  I provided 7 minutes of non-face-to-face time during this encounter.   [name], MD

## 2022-02-05 NOTE — Assessment & Plan Note (Signed)
Symptoms are consistent with a viral URI.  Discussed treatment of her cough.  Discussed that antibiotics would not play a role in treatment unless she worsened.  Advised that she would likely continue to improve.  Discussed cough medication and we will treat with Tussionex.  Advised that this could make her drowsy and if it does make her drowsy she should not drive.  Discussed trying to limit this to nighttime use.  Discussed risk of respiratory depression with this medication.  Advised if she develops shortness of breath she needs to seek medical attention.  If she is not continuing to improve she will let us know.

## 2022-03-07 ENCOUNTER — Other Ambulatory Visit: Payer: Self-pay | Admitting: Family Medicine

## 2022-03-07 DIAGNOSIS — I10 Essential (primary) hypertension: Secondary | ICD-10-CM

## 2022-03-07 DIAGNOSIS — F32A Depression, unspecified: Secondary | ICD-10-CM

## 2022-03-12 ENCOUNTER — Ambulatory Visit: Payer: Medicare HMO | Admitting: Family Medicine

## 2022-04-14 ENCOUNTER — Other Ambulatory Visit: Payer: Self-pay | Admitting: Family Medicine

## 2022-05-29 ENCOUNTER — Telehealth: Payer: Self-pay | Admitting: Family Medicine

## 2022-05-29 DIAGNOSIS — M62838 Other muscle spasm: Secondary | ICD-10-CM

## 2022-06-21 ENCOUNTER — Other Ambulatory Visit: Payer: Self-pay

## 2022-06-21 ENCOUNTER — Telehealth: Payer: Self-pay

## 2022-06-21 DIAGNOSIS — M62838 Other muscle spasm: Secondary | ICD-10-CM

## 2022-06-21 MED ORDER — TIZANIDINE HCL 2 MG PO TABS: ORAL_TABLET | ORAL | 1 refills | Status: DC

## 2022-06-21 NOTE — Telephone Encounter (Signed)
Patient called to request refill for tiZANidine (ZANAFLEX) 2 MG tablet.  Patient states she currently has a 30-day supply and would like to change it to a 60-day supply so it will last a month, as she takes two pills a night.  *Patient states her preferred pharmacy is [facility] on [address].

## 2022-06-21 NOTE — Telephone Encounter (Signed)
Prescription has been updated to reflect the 60 tablets.

## 2022-06-21 NOTE — Addendum Note (Signed)
Addended by: Leone Haven on: 06/21/2022 03:42 PM   Modules accepted: Orders

## 2022-07-06 ENCOUNTER — Other Ambulatory Visit: Payer: Self-pay | Admitting: Family Medicine

## 2022-07-06 DIAGNOSIS — E7800 Pure hypercholesterolemia, unspecified: Secondary | ICD-10-CM

## 2022-08-03 ENCOUNTER — Other Ambulatory Visit: Payer: Self-pay

## 2022-08-03 ENCOUNTER — Emergency Department: Payer: Medicare HMO

## 2022-08-03 ENCOUNTER — Emergency Department
Admission: EM | Admit: 2022-08-03 | Discharge: 2022-08-03 | Disposition: A | Discharge status: Home / Self Care | Payer: Medicare HMO | Attending: Emergency Medicine | Admitting: Emergency Medicine

## 2022-08-03 DIAGNOSIS — W1839XA Other fall on same level, initial encounter: Secondary | ICD-10-CM | POA: Insufficient documentation

## 2022-08-03 DIAGNOSIS — S300XXA Contusion of lower back and pelvis, initial encounter: Secondary | ICD-10-CM | POA: Insufficient documentation

## 2022-08-03 DIAGNOSIS — S3992XA Unspecified injury of lower back, initial encounter: Secondary | ICD-10-CM | POA: Diagnosis not present

## 2022-08-03 DIAGNOSIS — W19XXXA Unspecified fall, initial encounter: Secondary | ICD-10-CM

## 2022-08-03 MED ORDER — OXYCODONE-ACETAMINOPHEN 5-325 MG PO TABS: 1.0000 | ORAL_TABLET | ORAL | 0 refills | Status: DC | PRN

## 2022-08-03 MED ORDER — OXYCODONE-ACETAMINOPHEN 5-325 MG PO TABS
1.0000 | ORAL_TABLET | Freq: Once | ORAL | Status: AC
  Administered 2022-08-03: 1 via ORAL
  Filled 2022-08-03: qty 1

## 2022-08-03 MED ORDER — PREDNISONE 10 MG (21) PO TBPK: ORAL_TABLET | ORAL | 0 refills | Status: DC

## 2022-08-03 MED ORDER — ONDANSETRON 4 MG PO TBDP: 4.0000 mg | ORAL_TABLET | Freq: Three times a day (TID) | ORAL | 0 refills | Status: DC | PRN
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
## 2022-08-03 NOTE — Discharge Instructions (Signed)
Follow-up with your regular doctor as needed.  Return if worsening.  Take medication as prescribed.  Be careful with the narcotic as this may make you very drowsy.
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
## 2022-08-15 ENCOUNTER — Other Ambulatory Visit: Payer: Self-pay | Admitting: Family Medicine

## 2022-08-15 DIAGNOSIS — M62838 Other muscle spasm: Secondary | ICD-10-CM

## 2022-09-07 ENCOUNTER — Ambulatory Visit
Admission: RE | Admit: 2022-09-07 | Discharge: 2022-09-07 | Disposition: A | Discharge status: Home / Self Care | Payer: Medicare HMO | Source: Ambulatory Visit | Attending: Family Medicine | Admitting: Family Medicine

## 2022-09-07 DIAGNOSIS — Z1231 Encounter for screening mammogram for malignant neoplasm of breast: Secondary | ICD-10-CM

## 2022-09-07 DIAGNOSIS — Z780 Asymptomatic menopausal state: Secondary | ICD-10-CM | POA: Diagnosis not present

## 2022-10-06 ENCOUNTER — Other Ambulatory Visit: Payer: Self-pay | Admitting: Family Medicine

## 2022-10-06 DIAGNOSIS — I10 Essential (primary) hypertension: Secondary | ICD-10-CM

## 2022-10-06 DIAGNOSIS — M62838 Other muscle spasm: Secondary | ICD-10-CM

## 2022-10-10 ENCOUNTER — Encounter: Payer: Self-pay | Admitting: Family

## 2022-10-10 ENCOUNTER — Ambulatory Visit (INDEPENDENT_AMBULATORY_CARE_PROVIDER_SITE_OTHER): Payer: Medicare HMO | Admitting: Family

## 2022-10-10 VITALS — BP 130/80 | HR 55 | Temp 97.9°F | Ht 73.0 in | Wt 201.4 lb

## 2022-10-10 DIAGNOSIS — G629 Polyneuropathy, unspecified: Secondary | ICD-10-CM | POA: Diagnosis not present

## 2022-10-10 DIAGNOSIS — M792 Neuralgia and neuritis, unspecified: Secondary | ICD-10-CM

## 2022-10-10 LAB — IBC + FERRITIN
Ferritin: 58.6 ng/mL (ref 10.0–291.0)
Iron: 68 ug/dL (ref 42–145)
Saturation Ratios: 17.3 % — ABNORMAL LOW (ref 20.0–50.0)
TIBC: 392 ug/dL (ref 250.0–450.0)
Transferrin: 280 mg/dL (ref 212.0–360.0)

## 2022-10-10 LAB — C-REACTIVE PROTEIN: CRP: <1 mg/dL (ref 0.5–20.0)

## 2022-10-10 LAB — TSH: TSH: 1.59 u[IU]/mL (ref 0.35–5.50)

## 2022-10-10 LAB — B12 AND FOLATE PANEL
Folate: 8.6 ng/mL (ref >5.9)
Vitamin B-12: 190 pg/mL — ABNORMAL LOW (ref 211–911)

## 2022-10-10 LAB — SEDIMENTATION RATE: Sed Rate: 49 mm/hr — ABNORMAL HIGH (ref 0–30)

## 2022-10-10 MED ORDER — DULOXETINE HCL 30 MG PO CPEP: 30.0000 mg | ORAL_CAPSULE | Freq: Every day | ORAL | 1 refills | Status: DC

## 2022-10-10 NOTE — Assessment & Plan Note (Addendum)
Sensation intact on exam.  Palpable pedal pulse.  Reassuring shoulder exam.   pending B12 studies, autoimmune studies based on diffuse, chronic nature of pain. Chart states h/o RA and she is not currently following with rheumatology.  She declines referral to pain management, consideration of spinal nerve stimulator is recommended Dr. Lacinda Axon in 2021.  Left extremity was cold on exam today however patient describes feeling cold which may be from related to underlying neuropathy.  I have ordered arterial ultrasound to evaluate PAD.   Previously tried Cymbalta 5 or 10 years ago and was not effective.  Agreed to decrease Lexapro from 20 mg to 10 mg and then retrial Cymbalta 30 mg.  Continue gabapentin 1800 mg nightly as previously prescribed.  Counseled on how to take Tylenol arthritis and that she may benefit from scheduling medication. Considering imaging, PT, rheumatology referral.   Close follow-up

## 2022-10-10 NOTE — Patient Instructions (Addendum)
As discussed, let's start by scheduling Tylenol Arthritis which is a '650mg'$  tablet .   You may take 1-2 tablets every 8 hours ( scheduled) with maximum of 6 tablets per day.   For example , you could take two tablets in the morning ( 8am) and then two tablets again at 4pm.   Maximum daily dose of acetaminophen 4 g per day from all sources.  If you are taking another medication which includes acetaminophen (Tylenol) which may be in cough and cold preparations or pain medication such as Percocet, you will need to factor that into your total daily dose to be safe.  Please let me know if any questions  Decrease lexapro from '20mg'$  to '10mg'$ ; take lexapro '10mg'$  daily for 5 days THEN STOP lexapro completely and the following start cymbalta '30mg'$  once daily  Follow up with me in 2-3 weeks as we may need to increase cymbalta.

## 2022-10-10 NOTE — Progress Notes (Signed)
Subjective:    Patient ID: Maria Hamilton, female    DOB: 28-Apr-1951, 71 y.o.   MRN: [id number]  CC: Maria Hamilton is a 71 y.o. female who presents today for an acute visit.    HPI: Complains of left foot pain for years, unchanged.  She endorses sharp, shooting pain. 'throbbing at night' Left foot is numb. No numbness in  left leg. Low back pain is at baseline and 'will just be there'. Back pain is not as bothersome at this time.  Left foot stays cold for years.   No pain with walking. No swelling.       She also endorses episodic left shoulder numbness x 2 months, unchanged. No in shoulder or neck.   Describes as spasm in upper arm with tingling. Denies pain, injury   She avoids NSAIDs, tylenol.  She does housework.   She is righthanded.    She is compliant with gabapentin 1800 mg qhs without relief of left leg pain.   She is taking Zanaflex 2mg  , 1 to 2 tablets, with gabapentin without. Regimen is not too sedating. No alcohol use.  TSH 1.16 Ferritin 198 , 11/2020  H/o HTN, RA, fibromyalgia, RLS, anxiety and depression  Consult Dr Adriana Simas 03/08/20 for low back pain, left foot pain.  Advised left leg symptoms likely due to combination scar tissue and compression.  Dr. Adriana Simas did not recommend surgery.  He suspected back pain is multifactorial.  Recommended spinal cord stimulator placed referral  MRI lumbar spine obtained 02/20/2020 revealing stable solid interbody fusion L3-L4 and L4-L5.  No complicating feature. No foraminal stenosis.  Stable significant left stenosis L5 to S1 Left knee x-ray 03/08/2022 showed moderately advanced tricompartmental osteoarthritis without joint effusion   No ckd  H/o back surgery, cholecystectomy, abdominal hysterectomy, hip surgery  HISTORY:  Past Medical History:  Diagnosis Date   Acute anxiety 08/03/2015   Overview:  Last Assessment & Plan:  Klonopin most helpful  Reports past taking it 1 tab am 1/2 tab midday and 1 tab pm  30  day supply faxed to pharmacy   Acute bronchitis 12/31/2016   Acute pancreatitis 10/14/2016   Altered mental status 10/12/2016   Anxiety    Bronchitis    Chronic hip pain    Chronic pain syndrome    Depression    Edema    Fatigue    Fibromyalgia    GERD (gastroesophageal reflux disease)    Headache    Hypertension    Insomnia    Low back pain    Right upper quadrant abdominal pain    Vitamin D deficiency    Past Surgical History:  Procedure Laterality Date   ABDOMINAL HYSTERECTOMY     BACK SURGERY     BREAST BIOPSY Left 90s   benign   BREAST SURGERY     CHOLECYSTECTOMY N/A 10/16/2016   Procedure: LAPAROSCOPIC CHOLECYSTECTOMY WITH INTRAOPERATIVE CHOLANGIOGRAM;  Surgeon: Tiney Rouge III, MD;  Location: [facility] ORS;  Service: General;  Laterality: N/A;   COLONOSCOPY WITH PROPOFOL N/A 12/22/2021   Procedure: COLONOSCOPY WITH PROPOFOL;  Surgeon: Wyline Mood, MD;  Location: [facility] ENDOSCOPY;  Service: Gastroenterology;  Laterality: N/A;   cyst removal from wrist     ECTOPIC PREGNANCY SURGERY     HIP SURGERY     x4   OOPHORECTOMY     Family History  Problem Relation Age of Onset   Heart disease Mother    Cancer Father    Alcohol abuse Father  Allergies: Ace inhibitors, Ciprofloxacin, Fentanyl, Hctz [hydrochlorothiazide], Latex, Morphine and related, Penicillin g benzathine, Isovue m [iopamidol], and Tape Current Outpatient Medications on File Prior to Visit  Medication Sig Dispense Refill   albuterol (VENTOLIN HFA) 108 (90 Base) MCG/ACT inhaler Inhale 2 puffs into the lungs every 6 (six) hours as needed for wheezing or shortness of breath. 8 g 2   amLODipine (NORVASC) 10 MG tablet TAKE 1 TABLET EVERY DAY 90 tablet 2   calcium carbonate (OSCAL) 1500 (600 Ca) MG TABS tablet Take 600 mg of elemental calcium by mouth daily with breakfast.     carvedilol (COREG) 12.5 MG tablet TAKE 1 TABLET TWICE DAILY WITH MEALS 180 tablet 1   chlorpheniramine-HYDROcodone (TUSSIONEX PENNKINETIC ER)  10-8 MG/5ML Take 5 mLs by mouth every 12 (twelve) hours as needed for cough. 115 mL 0   cholecalciferol (VITAMIN D) 1000 units tablet Take 1,000 Units by mouth daily.     gabapentin (NEURONTIN) 600 MG tablet TAKE 1 TABLET AT BEDTIME 90 tablet 0   potassium chloride SA (KLOR-CON M) 20 MEQ tablet TAKE 1 TABLET EVERY DAY 90 tablet 1   rosuvastatin (CRESTOR) 20 MG tablet TAKE 1 TABLET EVERY DAY 90 tablet 0   spironolactone (ALDACTONE) 25 MG tablet TAKE 1/2 TABLET EVERY MORNING 45 tablet 10   sucralfate (CARAFATE) 1 g tablet Take 1 tablet (1 g total) by mouth 4 (four) times daily as needed (for abdominal discomfort, nausea, and/or vomiting). 30 tablet 0   tiZANidine (ZANAFLEX) 2 MG tablet TAKE 2 TABLETS AT BEDTIME AS NEEDED FOR MUSCLE SPASMS 30 tablet 10   triamcinolone ointment (KENALOG) 0.5 % Apply 1 application topically 2 (two) times daily. Prn Left neck and right inner arm x1-2 weeks prn 60 g 0   omeprazole (PRILOSEC OTC) 20 MG tablet Take 1 tablet (20 mg total) by mouth daily. 28 tablet 0   No current facility-administered medications on file prior to visit.    Social History   Tobacco Use   Smoking status: Never   Smokeless tobacco: Never  Vaping Use   Vaping Use: Never used  Substance Use Topics   Alcohol use: No    Alcohol/week: 0.0 standard drinks of alcohol   Drug use: No    Review of Systems  Constitutional:  Negative for chills and fever.  Respiratory:  Negative for cough.   Cardiovascular:  Negative for chest pain and palpitations.  Gastrointestinal:  Negative for nausea and vomiting.  Musculoskeletal:  Positive for arthralgias.  Neurological:  Positive for numbness.      Objective:    BP 130/80 (BP Location: Left Arm, Patient Position: Sitting, Cuff Size: Normal)   Pulse (!) 55   Temp 97.9 F (36.6 C) (Oral)   Ht 6\' 1"  (1.854 m)   Wt 201 lb 6.4 oz (91.4 kg)   SpO2 99%   BMI 26.57 kg/m    Physical Exam Vitals reviewed.  Constitutional:      Appearance: She  is well-developed.  Eyes:     Conjunctiva/sclera: Conjunctivae normal.  Cardiovascular:     Rate and Rhythm: Normal rate and regular rhythm.     Pulses: Normal pulses.     Heart sounds: Normal heart sounds.  Pulmonary:     Effort: Pulmonary effort is normal.     Breath sounds: Normal breath sounds. No wheezing, rhonchi or rales.  Musculoskeletal:     Left shoulder: No swelling or bony tenderness. Normal range of motion.     Cervical back: No edema,  erythema or bony tenderness. No pain with movement.     Left foot: Normal range of motion. No swelling or bony tenderness. Normal pulse.     Comments: Left Shoulder:   No asymmetry of shoulders when comparing right and left.No pain with palpation over glenohumeral joint lines,  joint, AC joint, or bicipital groove. No pain with internal and external rotation. No pain with resisted lateral extension .   Negative drop arm.  No pain, swelling, or ecchymosis noted over long head of biceps.  No bicep pain with resisted flexion and extension.  Negative speeds test. Negative Popeye's sign.   Strength and sensation normal BUE's.   Skin:    General: Skin is warm and dry.  Neurological:     Mental Status: She is alert.  Psychiatric:        Speech: Speech normal.        Behavior: Behavior normal.        Thought Content: Thought content normal.        Assessment & Plan:   Problem List Items Addressed This Visit       Nervous and Auditory   Polyneuropathy - Primary   Relevant Medications   DULoxetine (CYMBALTA) 30 MG capsule   Other Relevant Orders   [place] Lower Ext Art Bilat   TSH   IBC + Ferritin   B12 and Folate Panel   ANA   C-reactive protein   CYCLIC CITRUL PEPTIDE ANTIBODY, IGG/IGA   Rheumatoid factor   Sedimentation rate     Other   Neurogenic pain (Chronic)    Sensation intact on exam.  Palpable pedal pulse.  Reassuring shoulder exam.   pending B12 studies, autoimmune studies based on diffuse, chronic nature of pain.  Chart states h/o RA and she is not currently following with rheumatology.  She declines referral to pain management, consideration of spinal nerve stimulator is recommended Dr. Adriana Simas in 2021.  Left extremity was cold on exam today however patient describes feeling cold which may be from related to underlying neuropathy.  I have ordered arterial ultrasound to evaluate PAD.   Previously tried Cymbalta 5 or 10 years ago and was not effective.  Agreed to decrease Lexapro from 20 mg to 10 mg and then retrial Cymbalta 30 mg.  Continue gabapentin 1800 mg nightly as previously prescribed.  Counseled on how to take Tylenol arthritis and that she may benefit from scheduling medication. Considering imaging, PT, rheumatology referral.   Close follow-up      Relevant Medications   DULoxetine (CYMBALTA) 30 MG capsule      I have discontinued Uvaldo Rising. Horsch's Zinc, escitalopram, ondansetron, oxyCODONE-acetaminophen, and predniSONE. I am also having her start on DULoxetine. Additionally, I am having her maintain her calcium carbonate, cholecalciferol, triamcinolone ointment, albuterol, omeprazole, sucralfate, amLODipine, gabapentin, chlorpheniramine-HYDROcodone, carvedilol, potassium chloride SA, rosuvastatin, spironolactone, and tiZANidine.   Meds ordered this encounter  Medications   DULoxetine (CYMBALTA) 30 MG capsule    Sig: Take 1 capsule (30 mg total) by mouth daily.    Dispense:  30 capsule    Refill:  1    Order Specific Question:   Supervising Provider    Answer:   Sherlene Shams [[id number]]    Return precautions given.   Risks, benefits, and alternatives of the medications and treatment plan prescribed today were discussed, and patient expressed understanding.   Education regarding symptom management and diagnosis given to patient on AVS.  Continue to follow with Glori Luis, MD for routine  health maintenance.   Maria Hamilton and I agreed with plan.   Rennie Plowman, FNP

## 2022-10-11 LAB — ANA: Anti Nuclear Antibody (ANA): NEGATIVE

## 2022-10-11 LAB — RHEUMATOID FACTOR: Rheumatoid fact SerPl-aCnc: <14 IU/mL (ref <14)

## 2022-10-12 ENCOUNTER — Other Ambulatory Visit: Payer: Self-pay | Admitting: Family

## 2022-10-12 DIAGNOSIS — E538 Deficiency of other specified B group vitamins: Secondary | ICD-10-CM

## 2022-10-12 LAB — CYCLIC CITRUL PEPTIDE ANTIBODY, IGG/IGA: Cyclic Citrullin Peptide Ab: 4 units (ref 0–19)

## 2022-10-14 ENCOUNTER — Other Ambulatory Visit: Payer: Self-pay | Admitting: Family Medicine

## 2022-10-14 DIAGNOSIS — M62838 Other muscle spasm: Secondary | ICD-10-CM

## 2022-10-25 ENCOUNTER — Other Ambulatory Visit (INDEPENDENT_AMBULATORY_CARE_PROVIDER_SITE_OTHER): Payer: Medicare HMO

## 2022-10-25 DIAGNOSIS — E538 Deficiency of other specified B group vitamins: Secondary | ICD-10-CM | POA: Diagnosis not present

## 2022-10-27 LAB — CELIAC DISEASE AB SCREEN W/RFX
Antigliadin Abs, IgA: 7 units (ref 0–19)
IgA/Immunoglobulin A, Serum: 570 mg/dL — ABNORMAL HIGH (ref 64–422)
Transglutaminase IgA: <2 U/mL (ref 0–3)

## 2022-11-01 LAB — METHYLMALONIC ACID, SERUM: Methylmalonic Acid, Quant: 214 nmol/L (ref 87–318)

## 2022-11-01 LAB — INTRINSIC FACTOR ANTIBODIES: Intrinsic Factor: NEGATIVE

## 2022-11-01 LAB — ANTI-PARIETAL ANTIBODY: PARIETAL CELL AB SCREEN: NEGATIVE

## 2022-11-09 ENCOUNTER — Other Ambulatory Visit: Payer: Self-pay | Admitting: Family

## 2022-11-09 ENCOUNTER — Other Ambulatory Visit: Payer: Self-pay | Admitting: Family Medicine

## 2022-11-09 DIAGNOSIS — G629 Polyneuropathy, unspecified: Secondary | ICD-10-CM

## 2022-11-09 DIAGNOSIS — F419 Anxiety disorder, unspecified: Secondary | ICD-10-CM

## 2022-11-09 DIAGNOSIS — F32A Depression, unspecified: Secondary | ICD-10-CM

## 2022-11-09 DIAGNOSIS — M792 Neuralgia and neuritis, unspecified: Secondary | ICD-10-CM

## 2022-11-09 DIAGNOSIS — I10 Essential (primary) hypertension: Secondary | ICD-10-CM

## 2022-11-16 ENCOUNTER — Telehealth: Payer: Self-pay | Admitting: Family

## 2022-11-16 DIAGNOSIS — M7918 Myalgia, other site: Secondary | ICD-10-CM

## 2022-11-16 NOTE — Telephone Encounter (Signed)
Spoke to pt in regards to her results and pt verbalized understanding and will call to schedule b12 injections and is agreeable to referral to Rheumatology

## 2022-11-16 NOTE — Telephone Encounter (Signed)
Call patient  she is aware of B12 deficiency.  Does she want to start b12 injection? Please refer to result note from labs 10/10/22 and sch b12 injections    I advised a second opinion to ensure joint pain is not related to autoimmune disease.   If agreeable,  please place referral to rheumatology with below reason.   Reason: h/o RA per chart. I do not see rheumatology consult. Diffuse joint pain.  Autoimmune markers normal

## 2022-11-19 NOTE — Telephone Encounter (Signed)
Rheum referral placed.

## 2022-11-20 ENCOUNTER — Ambulatory Visit (INDEPENDENT_AMBULATORY_CARE_PROVIDER_SITE_OTHER): Payer: Medicare HMO

## 2022-11-20 DIAGNOSIS — E538 Deficiency of other specified B group vitamins: Secondary | ICD-10-CM | POA: Diagnosis not present

## 2022-11-20 MED ORDER — CYANOCOBALAMIN 1000 MCG/ML IJ SOLN
1000.0000 ug | Freq: Once | INTRAMUSCULAR | Status: AC
  Administered 2022-11-20: 1000 ug via INTRAMUSCULAR

## 2022-11-20 NOTE — Progress Notes (Signed)
Pt presented for their vitamin B12 injection. Pt was identified through two identifiers. Pt tolerated shot well in their left  deltoid.  

## 2022-11-22 ENCOUNTER — Other Ambulatory Visit: Payer: Self-pay | Admitting: Family Medicine

## 2022-11-26 NOTE — Telephone Encounter (Signed)
Patient has not had Potassium checked in over a year has had OV with [facility]  10/23 and has FU with [facility] scheduled 2/24 you are listed as PCP and on refill for Potassium please advise as to refill.

## 2022-11-26 NOTE — Telephone Encounter (Signed)
She needs to have a cmet and a lipid panel checked prior to this being refilled.

## 2022-11-30 ENCOUNTER — Other Ambulatory Visit: Payer: Self-pay

## 2022-11-30 DIAGNOSIS — I10 Essential (primary) hypertension: Secondary | ICD-10-CM

## 2022-11-30 DIAGNOSIS — M7918 Myalgia, other site: Secondary | ICD-10-CM

## 2022-11-30 MED ORDER — GABAPENTIN 600 MG PO TABS: 600.0000 mg | ORAL_TABLET | Freq: Every day | ORAL | 0 refills | Status: DC

## 2022-11-30 MED ORDER — CARVEDILOL 12.5 MG PO TABS: 12.5000 mg | ORAL_TABLET | Freq: Two times a day (BID) | ORAL | 1 refills | Status: DC

## 2022-12-04 ENCOUNTER — Encounter: Payer: Self-pay | Admitting: Family

## 2022-12-12 ENCOUNTER — Telehealth: Payer: Self-pay | Admitting: Family Medicine

## 2022-12-12 NOTE — Telephone Encounter (Signed)
Pt called stating ever since her BP medication has changed she has been feeling jittery and nervous. Sent to access nurse

## 2022-12-12 NOTE — Telephone Encounter (Signed)
Please follow-up with the patient and make sure she has an appointment to be seen for this as outlined in the access nurse note. Thanks.

## 2022-12-12 NOTE — Telephone Encounter (Signed)
Patient is scheduled with provider on 12/19/2022  to discuss medication.  Deleon Passe,cma

## 2022-12-19 ENCOUNTER — Ambulatory Visit (INDEPENDENT_AMBULATORY_CARE_PROVIDER_SITE_OTHER): Payer: Medicare HMO | Admitting: Family Medicine

## 2022-12-19 ENCOUNTER — Encounter: Payer: Self-pay | Admitting: Family Medicine

## 2022-12-19 VITALS — BP 130/80 | HR 65 | Temp 97.9°F | Ht 73.0 in | Wt 207.8 lb

## 2022-12-19 DIAGNOSIS — M792 Neuralgia and neuritis, unspecified: Secondary | ICD-10-CM

## 2022-12-19 DIAGNOSIS — G629 Polyneuropathy, unspecified: Secondary | ICD-10-CM

## 2022-12-19 DIAGNOSIS — M7070 Other bursitis of hip, unspecified hip: Secondary | ICD-10-CM | POA: Insufficient documentation

## 2022-12-19 DIAGNOSIS — N951 Menopausal and female climacteric states: Secondary | ICD-10-CM | POA: Diagnosis not present

## 2022-12-19 DIAGNOSIS — M1710 Unilateral primary osteoarthritis, unspecified knee: Secondary | ICD-10-CM | POA: Insufficient documentation

## 2022-12-19 DIAGNOSIS — I10 Essential (primary) hypertension: Secondary | ICD-10-CM

## 2022-12-19 DIAGNOSIS — S8000XA Contusion of unspecified knee, initial encounter: Secondary | ICD-10-CM | POA: Insufficient documentation

## 2022-12-19 LAB — COMPREHENSIVE METABOLIC PANEL
ALT: 8 U/L (ref 0–35)
AST: 15 U/L (ref 0–37)
Albumin: 4.1 g/dL (ref 3.5–5.2)
Alkaline Phosphatase: 91 U/L (ref 39–117)
BUN: 8 mg/dL (ref 6–23)
CO2: 27 mEq/L (ref 19–32)
Calcium: 9.3 mg/dL (ref 8.4–10.5)
Chloride: 103 mEq/L (ref 96–112)
Creatinine, Ser: 0.84 mg/dL (ref 0.40–1.20)
GFR: 69.78 mL/min (ref >60.00)
Glucose, Bld: 105 mg/dL — ABNORMAL HIGH (ref 70–99)
Potassium: 4.1 mEq/L (ref 3.5–5.1)
Sodium: 139 mEq/L (ref 135–145)
Total Bilirubin: 0.4 mg/dL (ref 0.2–1.2)
Total Protein: 7.5 g/dL (ref 6.0–8.3)

## 2022-12-19 LAB — LIPID PANEL
Cholesterol: 144 mg/dL (ref 0–200)
HDL: 64.1 mg/dL (ref >39.00)
LDL Cholesterol: 61 mg/dL (ref 0–99)
NonHDL: 79.62
Total CHOL/HDL Ratio: 2
Triglycerides: 91 mg/dL (ref 0.0–149.0)
VLDL: 18.2 mg/dL (ref 0.0–40.0)

## 2022-12-19 MED ORDER — DULOXETINE HCL 60 MG PO CPEP: 60.0000 mg | ORAL_CAPSULE | Freq: Every day | ORAL | 3 refills | Status: DC

## 2022-12-19 NOTE — Assessment & Plan Note (Signed)
Generally acceptable.  She will continue amlodipine 10 mg daily, carvedilol 12.5 mg twice daily, and spironolactone 12.5 mg daily.  Lab work checked today.

## 2022-12-19 NOTE — Progress Notes (Signed)
[name], MD Phone: [phone]  Maria Hamilton is a 72 y.o. female who presents today for f/u.  HYPERTENSION Disease Monitoring Home BP Monitoring not checking Chest pain- no    Dyspnea- no Medications Compliance-  taking amlodipine, coreg, spironolactone. BMET    Component Value Date/Time   NA 137 04/05/2021 2228   NA 137 03/02/2014 1131   K 2.9 (L) 04/05/2021 2228   K 3.2 (L) 03/02/2014 1131   CL 105 04/05/2021 2228   CL 104 03/02/2014 1131   CO2 24 04/05/2021 2228   CO2 30 03/02/2014 1131   GLUCOSE 113 (H) 04/05/2021 2228   GLUCOSE 88 03/02/2014 1131   BUN 8 04/05/2021 2228   BUN 5 (L) 03/02/2014 1131   CREATININE 0.87 04/05/2021 2228   CREATININE 1.00 (H) 02/05/2020 1350   CALCIUM 9.0 04/05/2021 2228   CALCIUM 8.5 03/02/2014 1131   GFRNONAA >60 04/05/2021 2228   GFRNONAA >60 03/02/2014 1131   GFRAA >60 07/10/2018 1315   GFRAA >60 03/02/2014 1131   Neuropathic pain: Patient continues to have neuropathic pain in her left foot.  Describes it as tingling itching and throbbing.  Mostly occurs at night.  She has a small amount of symptoms in her right foot.  She has had symptoms in her left leg since she had back issues and had surgery in the past.  She takes gabapentin 1200 mg nightly.  Mable Paris started her on Cymbalta to help with this.  The patient continues to take Lexapro in addition to the Cymbalta.  Flashes: Continues to have intermittent hot flashes.  These been going on since she went through menopause.  She notes they are not all that bad.  Social History   Tobacco Use  Smoking Status Never  Smokeless Tobacco Never    Current Outpatient Medications on File Prior to Visit  Medication Sig Dispense Refill   albuterol (VENTOLIN HFA) 108 (90 Base) MCG/ACT inhaler Inhale 2 puffs into the lungs every 6 (six) hours as needed for wheezing or shortness of breath. 8 g 2   amLODipine (NORVASC) 10 MG tablet TAKE 1 TABLET EVERY DAY 90 tablet 1    calcium carbonate (OSCAL) 1500 (600 Ca) MG TABS tablet Take 600 mg of elemental calcium by mouth daily with breakfast.     carvedilol (COREG) 12.5 MG tablet Take 1 tablet (12.5 mg total) by mouth 2 (two) times daily with a meal. 180 tablet 1   chlorpheniramine-HYDROcodone (TUSSIONEX PENNKINETIC ER) 10-8 MG/5ML Take 5 mLs by mouth every 12 (twelve) hours as needed for cough. 115 mL 0   cholecalciferol (VITAMIN D) 1000 units tablet Take 1,000 Units by mouth daily.     gabapentin (NEURONTIN) 600 MG tablet Take 1 tablet (600 mg total) by mouth at bedtime. 90 tablet 0   potassium chloride SA (KLOR-CON M) 20 MEQ tablet TAKE 1 TABLET EVERY DAY 90 tablet 1   rosuvastatin (CRESTOR) 20 MG tablet TAKE 1 TABLET EVERY DAY 90 tablet 0   spironolactone (ALDACTONE) 25 MG tablet TAKE 1/2 TABLET EVERY MORNING 45 tablet 10   sucralfate (CARAFATE) 1 g tablet Take 1 tablet (1 g total) by mouth 4 (four) times daily as needed (for abdominal discomfort, nausea, and/or vomiting). 30 tablet 0   tiZANidine (ZANAFLEX) 2 MG tablet TAKE 2 TABLETS(4 MG) BY MOUTH AT BEDTIME AS NEEDED FOR MUSCLE SPASMS 60 tablet 0   triamcinolone ointment (KENALOG) 0.5 % Apply 1 application topically 2 (two) times daily. Prn Left neck and right inner  arm x1-2 weeks prn 60 g 0   omeprazole (PRILOSEC OTC) 20 MG tablet Take 1 tablet (20 mg total) by mouth daily. 28 tablet 0   No current facility-administered medications on file prior to visit.     ROS see history of present illness  Objective  Physical Exam Vitals:   12/19/22 1327  BP: 130/80  Pulse: 65  Temp: 97.9 F (36.6 C)  SpO2: 99%    BP Readings from Last 3 Encounters:  12/19/22 130/80  10/10/22 130/80  08/03/22 126/80   Wt Readings from Last 3 Encounters:  12/19/22 207 lb 12.8 oz (94.3 kg)  10/10/22 201 lb 6.4 oz (91.4 kg)  08/03/22 203 lb 14.8 oz (92.5 kg)    Physical Exam Constitutional:      General: She is not in acute distress.    Appearance: She is not  diaphoretic.  Cardiovascular:     Rate and Rhythm: Normal rate and regular rhythm.     Heart sounds: Normal heart sounds.     Comments: 2+ DP and PT pulses bilaterally Pulmonary:     Effort: Pulmonary effort is normal.     Breath sounds: Normal breath sounds.  Skin:    General: Skin is warm and dry.  Neurological:     Mental Status: She is alert.     Comments: Sensation to light touch reduced in her left foot, normal in her right foot      Assessment/Plan: Please see individual problem list.  Hot flashes due to menopause Assessment & Plan: Chronic issue.  She will continue gabapentin 1200 mg nightly.  Discussed that increasing the Cymbalta potentially could help with this.   Polyneuropathy Assessment & Plan: Patient continues to have pain particularly in her left foot.  Discussed increasing Cymbalta to 60 mg daily.  She will discontinue her Lexapro with the Cymbalta increase.  She will continue gabapentin 1200 mg nightly.  We will refer her to pain management for further evaluation.  Orders: -     DULoxetine HCl; Take 1 capsule (60 mg total) by mouth daily.  Dispense: 90 capsule; Refill: 3 -     Ambulatory referral to Pain Clinic  Primary hypertension Assessment & Plan: Generally acceptable.  She will continue amlodipine 10 mg daily, carvedilol 12.5 mg twice daily, and spironolactone 12.5 mg daily.  Lab work checked today.  Orders: -     Comprehensive metabolic panel -     Lipid panel  Neurogenic pain -     DULoxetine HCl; Take 1 capsule (60 mg total) by mouth daily.  Dispense: 90 capsule; Refill: 3 -     Ambulatory referral to Pain Clinic    Return in about 3 months (around 03/20/2023) for HTN, neuropathic pain.   [name], MD [place]

## 2022-12-19 NOTE — Assessment & Plan Note (Signed)
Patient continues to have pain particularly in her left foot.  Discussed increasing Cymbalta to 60 mg daily.  She will discontinue her Lexapro with the Cymbalta increase.  She will continue gabapentin 1200 mg nightly.  We will refer her to pain management for further evaluation.

## 2022-12-19 NOTE — Assessment & Plan Note (Signed)
Chronic issue.  She will continue gabapentin 1200 mg nightly.  Discussed that increasing the Cymbalta potentially could help with this.

## 2022-12-19 NOTE — Patient Instructions (Signed)
Nice to see you. Please discontinue the escitalopram.  We are increasing your duloxetine from 30 mg to 60 mg once daily. Pain management should contact you to set up a visit.

## 2022-12-21 ENCOUNTER — Ambulatory Visit: Payer: Medicare HMO

## 2022-12-24 ENCOUNTER — Ambulatory Visit (INDEPENDENT_AMBULATORY_CARE_PROVIDER_SITE_OTHER): Payer: Medicare HMO | Admitting: *Deleted

## 2022-12-24 DIAGNOSIS — E538 Deficiency of other specified B group vitamins: Secondary | ICD-10-CM | POA: Diagnosis not present

## 2022-12-24 MED ORDER — CYANOCOBALAMIN 1000 MCG/ML IJ SOLN
1000.0000 ug | Freq: Once | INTRAMUSCULAR | Status: AC
  Administered 2022-12-24: 1000 ug via INTRAMUSCULAR

## 2022-12-24 NOTE — Progress Notes (Signed)
Pt received B12 injection in right deltoid & tolerated it well.

## 2022-12-31 ENCOUNTER — Other Ambulatory Visit: Payer: Self-pay | Admitting: Family Medicine

## 2022-12-31 DIAGNOSIS — M7918 Myalgia, other site: Secondary | ICD-10-CM

## 2023-01-09 ENCOUNTER — Other Ambulatory Visit: Payer: Self-pay | Admitting: Family

## 2023-01-09 ENCOUNTER — Other Ambulatory Visit: Payer: Self-pay | Admitting: Family Medicine

## 2023-01-09 DIAGNOSIS — M62838 Other muscle spasm: Secondary | ICD-10-CM

## 2023-01-09 DIAGNOSIS — E7800 Pure hypercholesterolemia, unspecified: Secondary | ICD-10-CM

## 2023-01-15 ENCOUNTER — Ambulatory Visit (INDEPENDENT_AMBULATORY_CARE_PROVIDER_SITE_OTHER): Payer: Medicare HMO | Admitting: Family

## 2023-01-15 ENCOUNTER — Encounter: Payer: Self-pay | Admitting: Family

## 2023-01-15 VITALS — BP 135/85 | HR 76 | Temp 98.3°F | Ht 69.0 in | Wt 191.0 lb

## 2023-01-15 DIAGNOSIS — R051 Acute cough: Secondary | ICD-10-CM

## 2023-01-15 DIAGNOSIS — U071 COVID-19: Secondary | ICD-10-CM | POA: Diagnosis not present

## 2023-01-15 LAB — POC COVID19 BINAXNOW: SARS Coronavirus 2 Ag: POSITIVE — AB

## 2023-01-15 LAB — POCT INFLUENZA A/B
Influenza A, POC: NEGATIVE
Influenza B, POC: NEGATIVE

## 2023-01-15 LAB — POCT RAPID STREP A (OFFICE): Rapid Strep A Screen: NEGATIVE

## 2023-01-15 MED ORDER — NIRMATRELVIR/RITONAVIR (PAXLOVID)TABLET: 3.0000 | ORAL_TABLET | Freq: Two times a day (BID) | ORAL | 0 refills | Status: AC

## 2023-01-15 NOTE — Progress Notes (Signed)
Assessment & Plan:  COVID-19 Assessment & Plan: No acute respiratory distress.  Patient has been febrile.  Counseled on lacking long term safely and effectiveness data of medication, Paxlovid. Explained EUA for Paxlovid. Criteria met for consideration of Paxlovid,  patient older than 12 years and weight > 40kg, started within 5 days of symptom onset and risk factor for severe disease include: age > 30, HTN  GFR > 60.  Counseled on adverse effects including altered taste, diarrhea, HTN, and myalgia.   Patient is most comfortable and desires to start Paxlovid and understands to call me with concerns or new symptoms.  Counseled on suspending rosuvastatin while on Paxlovid due to drug interaction.  Patient verbalized understanding   Orders: -     nirmatrelvir/ritonavir; Take 3 tablets by mouth 2 (two) times daily for 5 days. (Take nirmatrelvir 150 mg two tablets twice daily for 5 days and ritonavir 100 mg one tablet twice daily for 5 days) Patient GFR is 69  Dispense: 30 tablet; Refill: 0  Acute cough -     POC COVID-19 BinaxNow -     POCT rapid strep A -     POCT Influenza A/B     Return precautions given.   Risks, benefits, and alternatives of the medications and treatment plan prescribed today were discussed, and patient expressed understanding.   Education regarding symptom management and diagnosis given to patient on AVS either electronically or printed.  Return if symptoms worsen or fail to improve.  Rennie Plowman, FNP  Subjective:    Patient ID: Maria Hamilton, female    DOB: [birth year], 72 y.o.   MRN: [id number]  CC: Maria Hamilton is a 72 y.o. female who presents today for an acute visit.    HPI: Complains of cough, congestion and sore throat.  She has had a fever, Tmax 102.  Symptoms started 3 days ago.  Denies shortness of breath chest pain, GI symptoms.  She has not had a COVID booster.  Reports a history of having COVID in which she was hospitalized.  She has  not been taking medications for her symptoms.    History of hypertension, GERD  GFR 69  Allergies: Ace inhibitors, Ciprofloxacin, Fentanyl, Hctz [hydrochlorothiazide], Latex, Morphine and related, Penicillin g benzathine, Isovue m [iopamidol], and Tape Current Outpatient Medications on File Prior to Visit  Medication Sig Dispense Refill   albuterol (VENTOLIN HFA) 108 (90 Base) MCG/ACT inhaler Inhale 2 puffs into the lungs every 6 (six) hours as needed for wheezing or shortness of breath. 8 g 2   amLODipine (NORVASC) 10 MG tablet TAKE 1 TABLET EVERY DAY 90 tablet 1   calcium carbonate (OSCAL) 1500 (600 Ca) MG TABS tablet Take 600 mg of elemental calcium by mouth daily with breakfast.     carvedilol (COREG) 12.5 MG tablet Take 1 tablet (12.5 mg total) by mouth 2 (two) times daily with a meal. 180 tablet 1   chlorpheniramine-HYDROcodone (TUSSIONEX PENNKINETIC ER) 10-8 MG/5ML Take 5 mLs by mouth every 12 (twelve) hours as needed for cough. 115 mL 0   cholecalciferol (VITAMIN D) 1000 units tablet Take 1,000 Units by mouth daily.     DULoxetine (CYMBALTA) 60 MG capsule Take 1 capsule (60 mg total) by mouth daily. 90 capsule 3   gabapentin (NEURONTIN) 600 MG tablet TAKE 1 TABLET AT BEDTIME 90 tablet 3   potassium chloride SA (KLOR-CON M) 20 MEQ tablet TAKE 1 TABLET EVERY DAY 90 tablet 1   rosuvastatin (CRESTOR) 20 MG  tablet TAKE 1 TABLET EVERY DAY 90 tablet 0   spironolactone (ALDACTONE) 25 MG tablet TAKE 1/2 TABLET EVERY MORNING 45 tablet 10   sucralfate (CARAFATE) 1 g tablet Take 1 tablet (1 g total) by mouth 4 (four) times daily as needed (for abdominal discomfort, nausea, and/or vomiting). 30 tablet 0   tiZANidine (ZANAFLEX) 2 MG tablet TAKE 2 TABLETS(4 MG) BY MOUTH AT BEDTIME AS NEEDED FOR MUSCLE SPASMS 60 tablet 0   triamcinolone ointment (KENALOG) 0.5 % Apply 1 application topically 2 (two) times daily. Prn Left neck and right inner arm x1-2 weeks prn 60 g 0   omeprazole (PRILOSEC OTC) 20 MG  tablet Take 1 tablet (20 mg total) by mouth daily. 28 tablet 0   No current facility-administered medications on file prior to visit.    Review of Systems  Constitutional:  Positive for fever. Negative for chills.  HENT:  Positive for congestion.   Respiratory:  Positive for cough. Negative for shortness of breath and wheezing.   Cardiovascular:  Negative for chest pain and palpitations.  Gastrointestinal:  Negative for nausea and vomiting.      Objective:    BP 135/85   Pulse 76   Temp 98.3 F (36.8 C) (Oral)   Ht 5\' 9"  (1.753 m)   Wt 191 lb (86.6 kg)   SpO2 96%   BMI 28.21 kg/m   BP Readings from Last 3 Encounters:  01/15/23 135/85  12/19/22 130/80  10/10/22 130/80   Wt Readings from Last 3 Encounters:  01/15/23 191 lb (86.6 kg)  12/19/22 207 lb 12.8 oz (94.3 kg)  10/10/22 201 lb 6.4 oz (91.4 kg)    Physical Exam Vitals reviewed.  Constitutional:      Appearance: She is well-developed.  HENT:     Head: Normocephalic and atraumatic.     Right Ear: Hearing, tympanic membrane, ear canal and external ear normal. No decreased hearing noted. No drainage, swelling or tenderness. No middle ear effusion. No foreign body. Tympanic membrane is not erythematous or bulging.     Left Ear: Hearing, tympanic membrane, ear canal and external ear normal. No decreased hearing noted. No drainage, swelling or tenderness.  No middle ear effusion. No foreign body. Tympanic membrane is not erythematous or bulging.     Nose: Nose normal. No rhinorrhea.     Right Sinus: No maxillary sinus tenderness or frontal sinus tenderness.     Left Sinus: No maxillary sinus tenderness or frontal sinus tenderness.     Mouth/Throat:     Pharynx: Uvula midline. No oropharyngeal exudate or posterior oropharyngeal erythema.     Tonsils: No tonsillar abscesses.  Eyes:     Conjunctiva/sclera: Conjunctivae normal.  Cardiovascular:     Rate and Rhythm: Regular rhythm.     Pulses: Normal pulses.     Heart  sounds: Normal heart sounds.  Pulmonary:     Effort: Pulmonary effort is normal.     Breath sounds: Normal breath sounds. No wheezing, rhonchi or rales.  Lymphadenopathy:     Head:     Right side of head: No submental, submandibular, tonsillar, preauricular, posterior auricular or occipital adenopathy.     Left side of head: No submental, submandibular, tonsillar, preauricular, posterior auricular or occipital adenopathy.     Cervical: No cervical adenopathy.  Skin:    General: Skin is warm and dry.  Neurological:     Mental Status: She is alert.  Psychiatric:        Speech: Speech normal.  Behavior: Behavior normal.        Thought Content: Thought content normal.

## 2023-01-15 NOTE — Assessment & Plan Note (Signed)
No acute respiratory distress.  Patient has been febrile.  Counseled on lacking long term safely and effectiveness data of medication, Paxlovid. Explained EUA for Paxlovid. Criteria met for consideration of Paxlovid,  patient older than 12 years and weight > 40kg, started within 5 days of symptom onset and risk factor for severe disease include: age > 47, HTN  GFR > 60.  Counseled on adverse effects including altered taste, diarrhea, HTN, and myalgia.   Patient is most comfortable and desires to start Paxlovid and understands to call me with concerns or new symptoms.  Counseled on suspending rosuvastatin while on Paxlovid due to drug interaction.  Patient verbalized understanding

## 2023-01-15 NOTE — Patient Instructions (Addendum)
  While on Paxlovid, you must STOP rosuvastatin (Crestor) as this drug for cholesterol has an interaction with Paxlovid.  As soon as you have completed Paxlovid you may resume Crestor as usual   If you test positive for COVID-19, stay home for at least 5 days and isolate from others in your home. You are likely most infectious during these first 5 days. Wear a high-quality mask if you must be around others at home and in public. Do not go places where you are unable to wear a mask.  We discussed starting Paxlovid which is an unapproved drug that is authorized for use under an Emergency Use Authorization.  There are no adequate, approved, available products for the treatment of COVID-19 in adults who have mild-to-moderate COVID-19 and are at high risk for progressing to severe COVID-19, including hospitalization or death.  There are benefits and risks of taking this treatment as outlined in the "Fact Sheet for Patients and Caregivers." You may find this document here and please read in detail   [email or web address]   I have sent Paxlovid to your pharmacy. Please call pharmacy so they bring medication out to your car and you do not have to go inside.   PAXLOVID ADMINISTRATION INSTRUCTIONS:  Take with or without food. Swallow the tablets whole. Don't chew, crush, or break the medications because it might not work as well  For each dose of the medication, you should be taking 3 tablets together (2 pink oval and 1 white oval) TWICE a day for FIVE days   Finish your full five-day course of Paxlovid even if you feel better before you're done. Stopping this medication too early can make it less effective to prevent severe illness related to [place].    Paxlovid is prescribed for YOU ONLY. Don't share it with others, even if they have similar symptoms as you. This medication might not be right for everyone.  Make sure to take steps to protect yourself and others while you're  taking this medication in order to get well soon and to prevent others from getting sick with COVID-19.  Paxlovid (nirmatrelvir / ritonavir) can cause hormonal birth control medications to not work well. If you or your partner is currently taking hormonal birth control, use condoms or other birth control methods to prevent unintended pregnancies.   COMMON SIDE EFFECTS: Altered or bad taste in your mouth  Diarrhea  High blood pressure (1% of people) Muscle aches (1% of people)    If your COVID-19 symptoms get worse, get medical help right away. Call 911 if you experience symptoms such as worsening cough, trouble breathing, chest pain that doesn't go away, confusion, a hard time staying awake, and pale or blue-colored skin.This medication won't prevent all COVID-19 cases from getting worse.

## 2023-01-17 ENCOUNTER — Telehealth: Payer: Self-pay | Admitting: Family Medicine

## 2023-01-17 NOTE — Telephone Encounter (Signed)
Pt would like to be called in regards to medication she received for covid. Pt stated she cannot talk and she has diarrhea from the medication and she want to know if this is normal. Pt saw Ar[facility] for the appointment

## 2023-01-18 ENCOUNTER — Other Ambulatory Visit: Payer: Self-pay | Admitting: Family Medicine

## 2023-01-18 NOTE — Telephone Encounter (Signed)
Pt advised.

## 2023-01-21 ENCOUNTER — Ambulatory Visit: Payer: Medicare HMO | Admitting: Family

## 2023-01-21 ENCOUNTER — Ambulatory Visit (INDEPENDENT_AMBULATORY_CARE_PROVIDER_SITE_OTHER): Payer: Medicare HMO

## 2023-01-21 DIAGNOSIS — E538 Deficiency of other specified B group vitamins: Secondary | ICD-10-CM

## 2023-01-21 MED ORDER — CYANOCOBALAMIN 1000 MCG/ML IJ SOLN
1000.0000 ug | Freq: Once | INTRAMUSCULAR | Status: AC
  Administered 2023-01-21: 1000 ug via INTRAMUSCULAR

## 2023-01-21 NOTE — Progress Notes (Signed)
Pt presented for their vitamin B12 injection. Pt was identified through two identifiers. Pt tolerated shot well in their right deltoid.  

## 2023-01-22 NOTE — Progress Notes (Unsigned)
   SUBJECTIVE:  No chief complaint on file.  HPI ***  PERTINENT PMH / PSH: ***  OBJECTIVE:  There were no vitals taken for this visit.   Physical Exam  ASSESSMENT/PLAN:  There are no diagnoses linked to this encounter. PDMP reviewed***  No follow-ups on file.  Carollee Leitz, MD

## 2023-01-22 NOTE — Patient Instructions (Incomplete)
It was a pleasure meeting you today. Thank you for allowing me to take part in your health care.  Our goals for today as we discussed include:  Start Desitin barrier cream as needed  Start Valtrex 1 tablet 2 times a day for 7 days Sitz baths 3-4 times a day  If results of swabs negative will call to discontinue medication  If you have any questions or concerns, please do not hesitate to call the office at [phone].  I look forward to our next visit and until then take care and stay safe.  Regards,   Carollee Leitz, MD   [facility]

## 2023-01-23 ENCOUNTER — Ambulatory Visit (INDEPENDENT_AMBULATORY_CARE_PROVIDER_SITE_OTHER): Payer: Medicare HMO | Admitting: Family Medicine

## 2023-01-23 ENCOUNTER — Encounter: Payer: Self-pay | Admitting: Family Medicine

## 2023-01-23 VITALS — BP 122/76 | HR 66 | Temp 97.5°F | Ht 69.0 in | Wt 208.2 lb

## 2023-01-23 DIAGNOSIS — N898 Other specified noninflammatory disorders of vagina: Secondary | ICD-10-CM

## 2023-01-23 MED ORDER — DESITIN 40 % EX PSTE: PASTE | CUTANEOUS | 1 refills | Status: DC

## 2023-01-23 MED ORDER — VALACYCLOVIR HCL 1 G PO TABS: 1000.0000 mg | ORAL_TABLET | Freq: Two times a day (BID) | ORAL | 0 refills | Status: AC

## 2023-01-25 NOTE — Progress Notes (Unsigned)
[name]  Service Category: E/M  Provider: Oswaldo Done, MD  DOB: January 29, 1951  DOS: 01/30/2023  Referring Provider: Glori Luis, MD  MRN: [id number]  Setting: Ambulatory outpatient  PCP: Glori Luis, MD  Type: New Patient  Specialty: Interventional Pain Management    Location: Office  Delivery: Face-to-face     Primary Reason(s) for Visit: Encounter for initial evaluation of one or more chronic problems (new to examiner) potentially causing chronic pain, and posing a threat to normal musculoskeletal function. (Level of risk: High) CC: No chief complaint on file.  HPI  Maria Hamilton is a 72 y.o. year old, female patient, who comes for the first time to our practice referred by Glori Luis, MD for our initial evaluation of her chronic pain. She has GERD (gastroesophageal reflux disease); Rheumatoid factor positive; Chronic fatigue; Vitamin D deficiency; Insomnia; Fibromyalgia; Chronic low back pain (Primary Source of Pain) (Left); Chronic pain syndrome; Hypertension; 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); Long term current use of opiate analgesic; Long term prescription opiate use; Opiate use; Encounter for therapeutic drug level monitoring; Encounter for pain management planning; Rheumatoid arthritis, multiple sites (positive RF); Chronic lower extremity pain (Secondary source of pain) (Left); Lumbar facet arthropathy (Bilateral); Failed back surgical syndrome (x 3); Epidural fibrosis; Neurogenic pain; Musculoskeletal pain; Chronic lumbar radicular pain (L5/S1 dermatome) (Left); History of total hip replacement, bilateral; Hypokalemia; Constipation; Therapeutic opioid-induced constipation (OIC); Palpitations; Epigastric pain; Memory difficulty; Polyneuropathy; Post herpetic neuralgia; Abnormal MRI, lumbar spine (03/06/2017); 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); History of lumbar fusion (L3-4, L4-5, and L5-S1); Vertigo;  Chronic foot numbness (Left); RLS (restless legs syndrome); Ankle pain; Anxiety and depression; History of allergy to radiographic contrast media; Degeneration of lumbar intervertebral disc; Osteoarthritis of hip; Vitamin B 12 deficiency; Pain of both shoulder joints; Spondylosis without myelopathy or radiculopathy, lumbosacral region; Chronic hip pain after total replacement of hip joint (Left); Chronic hip pain after total replacement of hip joint (Right); Chronic pain of hip  (Bilateral) (L>R); Chronic sacroiliac joint pain (Bilateral) (L>R); Lumbar facet syndrome (Bilateral) (L>R); Numbness and tingling of foot; Hot flashes due to menopause; Toe swelling; Leg edema; Hot flashes; Leg swelling; Left foot pain; Left shoulder pain; Falls; Muscle cramps; History of COVID-19; Generalized weakness; Unsteady gait; COVID-19; Hyperlipidemia; Left knee pain; Hair loss; Arthritis of knee; Bursitis of hip; Contusion of knee; and Vaginal lesion on their problem list. Today she comes in for evaluation of her No chief complaint on file.  Pain Assessment: Location:     Radiating:   Onset:   Duration:   Quality:   Severity:  /10 (subjective, self-reported pain score)  Effect on ADL:   Timing:   Modifying factors:   BP:    HR:    Onset and Duration: {Hx; Onset and Duration:210120511} Cause of pain: {Hx; Cause:210120521} Severity: {Pain Severity:210120502} Timing: {Symptoms; Timing:210120501} Aggravating Factors: {Causes; Aggravating pain factors:210120507} Alleviating Factors: {Causes; Alleviating Factors:210120500} Associated Problems: {Hx; Associated problems:210120515} Quality of Pain: {Hx; Symptom quality or Descriptor:210120531} Previous Examinations or Tests: {Hx; Previous examinations or test:210120529} Previous Treatments: {Hx; Previous Treatment:210120503}  Maria Hamilton is being evaluated for possible interventional pain management therapies for the treatment of her chronic pain.   Maria Hamilton  is a prior patient of this practice who was last seen on 05/08/2018 at which time we did a left-sided lumbar facet medial branch block #1 unfortunately, the patient did not follow-up after that injection and we have no information as  to how she did with the diagnostic injection.  She was first seen on 09/24/2016 with a chief complaint of low back pain.  Review of initial evaluation (09/24/2016): "The patient's primary pain is that of the lower back, on the left side, associated to 3 back surgeries that she had with the first one taking place in the year 2000. The first 2 surgeries were done by Dr. Phoebe Perch for her low back pain with some leg pain. The third surgery was done by Dr. Gorden Harms in 2016 for the low back pain and leg pain.   Her second worst pain is that of the lower extremity on the left side. This pain is described to go all the way down to the top and bottom of the foot following an L5 and an S1 dermatomal distribution. The patient has a history of having had 2 hip replacements complicated with an apparent MRSA infection. The patient admits to having had some injections in the lower back done by Dr. crisp, many years for her first surgery. The patient indicates that after her first surgery she had some relief but none after the other 2. Recent x-rays would suggest a lucency around the pedicle screws suggesting movement. The patient recently had a prescription written for her for hydrocodone and she had problems with this due to itching. She indicates that she has tried multiple different prescriptions by multiple practitioners but I was unable to pin her to give me a specific practitioner that has prescribed pain medications for her. She denies having any diabetes and she indicates that she had a Dilaudid injection last Thursday. The last time that she took any pain medication was this morning (hydrocodone). She also admits to taking both anxiety pills". She indicates the last time that she took some  Percocet was approximately 1 year ago. However, the [employer] [facility] Database shows that the patient had a prescription for Percocet filled on 09/20/2016. This was written by Lawernce Pitts, PA-C, in [place], [place]. Before that, she had another prescription for Percocet written on 07/19/2016 by Nona Dell at [facility]. In fact, the patient has a history of using opioids on a regular monthly basis, going back to 09/24/2010. The patient's highest documented MME/Day is 112.5 mg/day."  ***  Maria Hamilton has been informed that this initial visit was an evaluation only.  On the follow up appointment I will go over the results, including ordered tests and available interventional therapies. At that time she will have the opportunity to decide whether to proceed with offered therapies or not. In the event that Maria Hamilton prefers avoiding interventional options, this will conclude our involvement in the case.  Medication management recommendations may be provided upon request.  Historic Controlled Substance Pharmacotherapy Review  PMP and historical list of controlled substances: Oxycodone/APAP 5/325 (# 20), 2 tabs every 4 hours (last filled on 08/03/2022); hydrocodone/chlorpheniramine ER suspension (# 115) last filled on 02/05/2022. Most recently prescribed opioid analgesics:   None MME/day: 0 mg/day  Historical Monitoring: The patient  reports no history of drug use. List of prior UDS Testing: Lab Results  Component Value Date   MDMA NONE DETECTED 07/13/2016   COCAINSCRNUR NONE DETECTED 07/13/2016   PCPSCRNUR NONE DETECTED 07/13/2016   THCU NONE DETECTED 07/13/2016   ETH <5 07/13/2016   Historical Background Evaluation: [place] PMP: PDMP reviewed during this encounter. Review of the past 39-months conducted.             PMP NARX Score Report:  Narcotic: 140 Sedative: 060 Stimulant: 000 [place] Department of public safety, offender search: Engineer, mining Information)  Non-contributory Risk Assessment Profile: Aberrant behavior: None observed or detected today Risk factors for fatal opioid overdose: None identified today PMP NARX Overdose Risk Score: 020 Fatal overdose hazard ratio (HR): Calculation deferred Non-fatal overdose hazard ratio (HR): Calculation deferred Risk of opioid abuse or dependence: 0.7-3.0% with doses ? 36 MME/day and 6.1-26% with doses ? 120 MME/day. Substance use disorder (SUD) risk level: See below Personal History of Substance Abuse (SUD-Substance use disorder):  Alcohol:    Illegal Drugs:    Rx Drugs:    ORT Risk Level calculation:    ORT Scoring interpretation table:  Score <3 = Low Risk for SUD  Score between 4-7 = Moderate Risk for SUD  Score >8 = High Risk for Opioid Abuse   PHQ-2 Depression Scale:  Total score:    PHQ-2 Scoring interpretation table: (Score and probability of major depressive disorder)  Score 0 = No depression  Score 1 = 15.4% Probability  Score 2 = 21.1% Probability  Score 3 = 38.4% Probability  Score 4 = 45.5% Probability  Score 5 = 56.4% Probability  Score 6 = 78.6% Probability   PHQ-9 Depression Scale:  Total score:    PHQ-9 Scoring interpretation table:  Score 0-4 = No depression  Score 5-9 = Mild depression  Score 10-14 = Moderate depression  Score 15-19 = Moderately severe depression  Score 20-27 = Severe depression (2.4 times higher risk of SUD and 2.89 times higher risk of overuse)   Pharmacologic Plan: As per protocol, I have not taken over any controlled substance management, pending the results of ordered tests and/or consults.            Initial impression: Pending review of available data and ordered tests.  Meds   Current Outpatient Medications:    albuterol (VENTOLIN HFA) 108 (90 Base) MCG/ACT inhaler, Inhale 2 puffs into the lungs every 6 (six) hours as needed for wheezing or shortness of breath., Disp: 8 g, Rfl: 2   amLODipine (NORVASC) 10 MG tablet, TAKE 1 TABLET EVERY  DAY, Disp: 90 tablet, Rfl: 1   calcium carbonate (OSCAL) 1500 (600 Ca) MG TABS tablet, Take 600 mg of elemental calcium by mouth daily with breakfast., Disp: , Rfl:    carvedilol (COREG) 12.5 MG tablet, Take 1 tablet (12.5 mg total) by mouth 2 (two) times daily with a meal., Disp: 180 tablet, Rfl: 1   cholecalciferol (VITAMIN D) 1000 units tablet, Take 1,000 Units by mouth daily., Disp: , Rfl:    DULoxetine (CYMBALTA) 60 MG capsule, Take 1 capsule (60 mg total) by mouth daily., Disp: 90 capsule, Rfl: 3   gabapentin (NEURONTIN) 600 MG tablet, TAKE 1 TABLET AT BEDTIME, Disp: 90 tablet, Rfl: 3   omeprazole (PRILOSEC OTC) 20 MG tablet, Take 1 tablet (20 mg total) by mouth daily., Disp: 28 tablet, Rfl: 0   potassium chloride SA (KLOR-CON M) 20 MEQ tablet, TAKE 1 TABLET EVERY DAY, Disp: 90 tablet, Rfl: 3   rosuvastatin (CRESTOR) 20 MG tablet, TAKE 1 TABLET EVERY DAY, Disp: 90 tablet, Rfl: 0   spironolactone (ALDACTONE) 25 MG tablet, TAKE 1/2 TABLET EVERY MORNING, Disp: 45 tablet, Rfl: 10   sucralfate (CARAFATE) 1 g tablet, Take 1 tablet (1 g total) by mouth 4 (four) times daily as needed (for abdominal discomfort, nausea, and/or vomiting)., Disp: 30 tablet, Rfl: 0   tiZANidine (ZANAFLEX) 2 MG tablet, TAKE 2 TABLETS(4 MG) BY MOUTH  AT BEDTIME AS NEEDED FOR MUSCLE SPASMS, Disp: 60 tablet, Rfl: 0   triamcinolone ointment (KENALOG) 0.5 %, Apply 1 application topically 2 (two) times daily. Prn Left neck and right inner arm x1-2 weeks prn, Disp: 60 g, Rfl: 0   valACYclovir (VALTREX) 1000 MG tablet, Take 1 tablet (1,000 mg total) by mouth 2 (two) times daily for 7 days., Disp: 14 tablet, Rfl: 0   Zinc Oxide (DESITIN) 40 % PSTE, Apply to affected area as needed, Disp: 56.7 g, Rfl: 1  Imaging Review  Cervical Imaging: Cervical CT wo contrast: Results for orders placed during the hospital encounter of 06/18/20 CT Cervical Spine Wo Contrast  Narrative CLINICAL DATA:  72 year old female with head  trauma.  EXAM: CT HEAD WITHOUT CONTRAST  CT CERVICAL SPINE WITHOUT CONTRAST  TECHNIQUE: Multidetector CT imaging of the head and cervical spine was performed following the standard protocol without intravenous contrast. Multiplanar CT image reconstructions of the cervical spine were also generated.  COMPARISON:  Head CT dated 07/10/2018.  FINDINGS: CT HEAD FINDINGS  Brain: Mild age-related atrophy and chronic microvascular ischemic changes. There is no acute intracranial hemorrhage. No mass effect or midline shift. No extra-axial fluid collection.  Vascular: No hyperdense vessel or unexpected calcification.  Skull: Normal. Negative for fracture or focal lesion.  Sinuses/Orbits: Mild mucoperiosteal thickening of paranasal sinuses. The mastoid air cells are clear. No air-fluid level.  Other: None  CT CERVICAL SPINE FINDINGS  Alignment: No acute subluxation. There is straightening of normal cervical lordosis which may be positional or related to muscle spasm.  Skull base and vertebrae: No acute fracture. Osteopenia.  Soft tissues and spinal canal: No prevertebral fluid or swelling. No visible canal hematoma.  Disc levels: Multilevel degenerative changes with disc space narrowing and endplate irregularity. Multilevel facet arthropathy most prominent at C5-C6.  Upper chest: Negative.  Other: Bilateral carotid bulb calcified plaques.  IMPRESSION: 1. No acute intracranial pathology. Mild age-related atrophy and chronic microvascular ischemic changes. 2. No acute/traumatic cervical spine pathology. Multilevel degenerative changes.   Electronically Signed By: Elgie Collard M.D. On: 06/18/2020 23:04  Thoracic Imaging: Thoracic CT wo contrast: Results for orders placed during the hospital encounter of 06/18/20 CT Thoracic Spine Wo Contrast  Narrative CLINICAL DATA:  Back trauma, no prior imaging. Additional provided: Patient fell off porch, injuring back,  head and neck.  EXAM: CT THORACIC AND LUMBAR SPINE WITHOUT CONTRAST  TECHNIQUE: Multidetector CT imaging of the thoracic and lumbar spine was performed without contrast. Multiplanar CT image reconstructions were also generated.  COMPARISON:  Chest CT 09/18/2017, lumbar spine MRI 02/20/2020  FINDINGS: CT THORACIC SPINE FINDINGS  Alignment: Trace T10-T11 grade 1 retrolisthesis.  Vertebrae: Vertebral body height is maintained. Redemonstrated mild chronic T1 superior endplate deformity. A subtle T2 superior endplate deformity is new as compared to prior chest CT 09/18/2017, but otherwise age indeterminate. No evidence of acute fracture elsewhere within the thoracic spine  Paraspinal and other soft tissues: No consolidation within the imaged lung apices. No visible pneumothorax. Aortic arch atherosclerosis. Incidentally noted aberrant right subclavian artery. No acute abnormality identified within included portions of the abdomen/retroperitoneum.  Disc levels:  Mild-to-moderate disc space narrowing throughout the thoracic spine. Small calcified central disc protrusion at T10-T11. Mild multilevel facet arthrosis and ligamentum flavum hypertrophy. Multilevel ventrolateral osteophytes, most notably at T4-T5. No appreciable significant spinal canal stenosis. Facet hypertrophy contributes to suspected at least moderate neural foraminal narrowing on the right at T9-T10 and on the left at T10-T11.  CT LUMBAR  SPINE FINDINGS  Segmentation: 5 lumbar vertebrae.  Alignment: Mild T12-L1, L1-L2 and L2-L3 grade 1 retrolisthesis.  Vertebrae: Vertebral body height is maintained. No evidence of acute fracture to the lumbar spine. Solid fusion of the vertebral bodies and posterior element at L3-L4 and L4-L5. Redemonstrated sequela of prior posterior decompression with posterior spinal fusion construct and interbody spacer at L5-S1. There is no appreciable solid fusion across the L5-S1 disc  space. No evidence of hardware compromise.  Paraspinal and other soft tissues: Aortoiliac atherosclerosis. Postsurgical changes to the dorsal paraspinal soft tissues.  Disc levels:  Unless otherwise stated, the level by level findings below have not appreciable changed since prior MRI 03/06/2017.  Mild disc space narrowing at the non fused levels.  L1-L2: Mild grade 1 retrolisthesis. Disc bulge. Mild facet arthrosis/ligamentum flavum hypertrophy. No more than mild appreciable spinal canal stenosis. Mild/moderate bilateral neural foraminal narrowing.  L2-L3: Mild grade 1 retrolisthesis. Disc bulge. Facet arthrosis/ligamentum flavum hypertrophy. Progressive moderate bilateral subarticular and central canal stenosis. Progressive mild/moderate bilateral neural foraminal narrowing.  L3-L4: Post laminectomy changes. Solid fusion. Posterior element hypertrophy. No appreciable significant spinal canal or foraminal stenosis.  L4-L5: Post laminectomy changes. Solid fusion. Posterior element hypertrophy. No appreciable significant spinal canal or foraminal stenosis.  L5-S1: Prior posterior decompression posterior spinal fusion. Endplate spurring. Posterior element hypertrophy. Streak and beam hardening artifact from spinal hardware significant limits evaluation of the canal and neural foramina at these levels.  IMPRESSION: CT THORACIC SPINE IMPRESSION  1. A subtle T2 vertebral superior endplate compression deformity is new as compared to the CT of 09/18/2017, but is otherwise age indeterminate. 2. No evidence of acute fracture elsewhere in the thoracic spine. 3. Redemonstrated mild chronic T1 vertebral superior endplate compression deformity. 4. Thoracic spondylosis as described. No appreciable significant spinal canal stenosis. Facet hypertrophy contributes to suspected at least moderate neural foraminal narrowing on the right at T9-T10 and on the left at T10-T11. 5. Minimal  T11-T12 grade 1 retrolisthesis. 6. Aortic Atherosclerosis (ICD10-I70.0). 7. Incidentally noted aberrant right subclavian artery.  CT LUMBAR SPINE IMPRESSION  1. No evidence of acute fracture to the lumbar spine. 2. Unchanged postoperative sequela, as detailed. No evidence of hardware compromise. 3. Lumbar spondylosis as outlined. Most notably at L2-L3, there is suspected progressive multifactorial moderate spinal canal stenosis. Streak and beam hardening artifact significantly limits evaluation of the spinal canal and neural foramina at L5-S1. 4. Mild T12-L1, L1-L2 and L2-L3 grade 1 retrolisthesis.   Electronically Signed By: Jackey Loge DO On: 06/18/2020 23:19  Lumbosacral Imaging: Lumbar MR wo contrast: Results for orders placed during the hospital encounter of 02/20/20 MR Lumbar Spine Wo Contrast  Narrative CLINICAL DATA:  Low back and left foot drop.  EXAM: MRI LUMBAR SPINE WITHOUT CONTRAST  TECHNIQUE: Multiplanar, multisequence MR imaging of the lumbar spine was performed. No intravenous contrast was administered.  COMPARISON:  MRI lumbar spine 03/06/2017  FINDINGS: Segmentation: There are five lumbar type vertebral bodies. The last full intervertebral disc space is labeled L5-S1. This correlates with the prior study.  Alignment:  Normal overall alignment.  Vertebrae: Stable solid interbody fusion changes at L3-4 and L4-5. Stable posterior and interbody fusion changes at L5-S1. No bone lesions or fractures are identified. No findings suspicious for discitis or osteomyelitis.  Conus medullaris and cauda equina: Conus extends to the T12-L1 level. Conus and cauda equina appear normal.  Paraspinal and other soft tissues: No significant paraspinal or retroperitoneal findings. There is a stable large right renal cyst noted.  Disc levels:  T12-L1: No significant findings.  L1-2: Mild annular bulge with mild lateral recess encroachment bilaterally. No  significant spinal or foraminal stenosis.  L2-3: Diffuse bulging annulus and advanced facet disease along with mild epidural lipomatosis contributing to mild stable spinal and bilateral lateral recess stenosis. No foraminal stenosis.  L3-4: Solid interbody fusion changes. No complicating features. No spinal or foraminal stenosis.  L4-5: Solid interbody fusion. No complicating features. No spinal or foraminal stenosis.  L5-S1: Posterior and interbody fusion hardware. Minimal if any solid interbody fusion changes. Persistent left foraminal stenosis appears to be due to a combination of osteophytic spurring and left-sided facet disease. There may be a small bone fragment or some partially ossified disc material.  IMPRESSION: 1. Stable solid interbody fusion changes at L3-4 and L4-5. No complicating features. No spinal or foraminal stenosis. 2. Stable mild spinal and bilateral lateral recess stenosis at L2-3. 3. Stable significant left foraminal stenosis at L5-S1.   Electronically Signed By: Rudie Meyer M.D. On: 02/21/2020 12:11  Lumbar MR w/wo contrast: Results for orders placed during the hospital encounter of 03/06/17 MR LUMBAR SPINE W WO CONTRAST  Narrative CLINICAL DATA:  Low back pain for 1 week. Pain extends into the left lower extremity to the foot. Numbness in the foot. Multiple prior lumbar spine surgeries.  EXAM: MRI LUMBAR SPINE WITHOUT AND WITH CONTRAST  TECHNIQUE: Multiplanar and multiecho pulse sequences of the lumbar spine were obtained without and with intravenous contrast.  CONTRAST:  20mL MULTIHANCE GADOBENATE DIMEGLUMINE 529 MG/ML IV SOLN  COMPARISON:  MRI of the lumbar spine 07/20/2016  FINDINGS: Segmentation: 5 non rib-bearing lumbar type vertebral bodies are present.  Alignment: AP alignment is anatomic. No significant scoliosis is present.  Vertebrae: Mild endplate marrow changes are noted anteriorly at L1-2. Marrow signal and vertebral  body heights are otherwise normal. There is no pathologic enhancement within the marrow containing spaces. There is some artifact from hardware at L5-S1.  Conus medullaris: Extends to the L1 level and appears normal.  Paraspinal and other soft tissues: Right renal cysts is stable. Limited imaging of the abdomen is otherwise unremarkable. No significant adenopathy is present.  Disc levels:  L1-2: Mild facet hypertrophy and disc bulging is stable. No significant stenosis or change is present.  L2-3: A broad-based disc protrusion is asymmetric to the left. Moderate facet hypertrophy is seen bilaterally. There is slight progression of mild left subarticular and foraminal stenosis. The right foramen is patent.  L3-4: Solid fusion is stable. No residual or recurrent stenosis is present.  L4-5: Solid fusion is stable. No residual or recurrent stenosis is present.  L5-S1: Posterior lumbar fusion is noted. Previously noted soft tissue in the left foramen is less evident on today's study. There is no residual or recurrent stenosis.  IMPRESSION: 1. Progressive facet arthropathy and a broad-based disc protrusion at L2-3 with slight progression of mild left subarticular and foraminal stenosis. 2. Stable solid fusion at L3-4 L4-5 without residual or recurrent stenosis at either level. 3. Posterior lumbar fusion at L5-S1 without significant residual or recurrent stenosis.   Electronically Signed By: Marin Roberts M.D. On: 03/06/2017 12:27  Lumbar CT wo contrast: Results for orders placed during the hospital encounter of 08/03/22 CT Lumbar Spine Wo Contrast  Narrative CLINICAL DATA:  Back trauma, no prior imaging (Age >= 16y)  EXAM: CT LUMBAR SPINE WITHOUT CONTRAST  TECHNIQUE: Multidetector CT imaging of the lumbar spine was performed without intravenous contrast administration. Multiplanar CT image reconstructions were also  generated.  RADIATION DOSE REDUCTION: This  exam was performed according to the departmental dose-optimization program which includes automated exposure control, adjustment of the mA and/or kV according to patient size and/or use of iterative reconstruction technique.  COMPARISON:  CT 04/06/2021, CT 06/18/2020  FINDINGS: Segmentation: 5 lumbar type vertebrae.  Alignment: Normal.  Vertebrae: There is no acute lumbar spine fracture. Prior anterior fusion at L3-L4 and L4-L5 and anterior and posterior fusion at L5-S1. There is bony fusion at these levels. Previous posterior hardware removal at L3-L4. Remaining hardware at L5-S1 is intact without evidence of loosening.  Paraspinal and other soft tissues: Right renal cyst, partially visualized, appears similar to prior CT in April 2022. Prior cholecystectomy. Scattered aortoiliac atherosclerosis. Partially visualized bilateral hip arthroplasties.  Disc levels:  T12-L1: There is trace degenerative retrolisthesis with minimal disc bulging and bilateral facet arthropathy. No significant spinal canal or neural foraminal stenosis.  L1-L2: There is trace retrolisthesis with mild disc bulging, ligamentum flavum hypertrophy and mild facet arthropathy. There is no significant spinal canal stenosis. There is mild-to-moderate bilateral neural foraminal narrowing.  L2-L3: Mild disc bulging, ligament flavum hypertrophy and bilateral facet arthropathy. Moderate spinal canal stenosis, mild to moderate left and mild right neural foraminal stenosis.  L3-L4: Prior fusion with solid arthrodesis. Bony hypertrophy rolls in mild right lateral recess narrowing. No significant neural foraminal stenosis.  L4-L5: Prior fusion with solid arthrodesis. Spinal canal is decompressed. Bony hypertrophy results in mild left neural foraminal narrowing. No significant right neural foraminal narrowing.  L5-S1: Prior anterior and posterior fusion with solid arthrodesis. Bony spurring and hypertrophy  results in moderate to severe left-sided neural foraminal stenosis and contact with the exiting left L5 nerve root. Spinal canal is decompressed. No significant right neural foraminal stenosis.  IMPRESSION: No evidence of acute lumbar spine fracture.  Prior L3-S1 fusion without evidence of hardware complication. Bony hypertrophy results in mild left neural foraminal narrowing at L4-L5 and moderate-severe left-sided neural foraminal stenosis at L5-S1. Mild right lateral recess narrowing at L3-L4. Patent spinal canal at L4-L5 and L5-S1.  Multilevel degenerative changes above the fusion resulting mild-to-moderate neural foraminal stenosis at L1-L2 and L2-L3. Moderate spinal canal stenosis at L2-L3, similar to prior.   Electronically Signed By: Caprice Renshaw M.D. On: 08/03/2022 12:11  Lumbar CT w contrast: Results for orders placed in visit on 07/16/02 CT Lumbar Spine W Contrast  Narrative FINDINGS CLINICAL DATA:  BACK AND LEFT LEG PAIN. LUMBAR DISKOGRAM THE PATIENT WAS GIVEN EXTENSIVE INFORMED CONSENT PRIOR TO THE PROCEDURE INCLUDING THE RISK OF PAIN, INFECTION, AND NEUROLOGIC DEFICIT.  SPECIFICALLY, THE RISK OF DISKITIS AND OSTEOMYELITIS WERE DISCUSSED.  THE PATIENT AGREED TO PROCEED.  THE PATIENT RECEIVED VANCOMYCIN AS OUTLINED ON THE NURSING SHEET PRIOR TO THE PROCEDURE.  AN APPROPRIATE AMOUNT OF VANCOMYCIN WAS ADDED TO THE CONTRAST (OMNIPAQUE 180).  HER BACK WAS PREPARED WITH A STERILE SCRUB SPONGE FOR FIVE MINUTES. FOLLOWED BY APPLICATION OF BETADINE SOLUTION.  STERILE DRAPES WERE APPLIED.  STRICT STERILE TECHNIQUE WAS USED BY EVERYONE IN THE ROOM.  INCREMENTAL DOSES OF VERSED WERE USED FOR CONSCIOUS SEDATION.  A RIGHT PARASPINOUS APPROACH WAS TAKEN TO THE LOWER FOUR DISK SPACES AS FOLLOWS: L2-3:  OPENING PRESSURE 20 PSI.  PRESSURE AT PAIN RESPONSE 80 PSI. QUANTITY OF CONTRAST 2.5 ML. LEVEL OF PAIN WAS 6 ON A SCALE OF 1 TO 10.  SHE FELT PRESSURE IN THE BACK WHICH WAS NOT  TYPICAL FOR HER PAIN AT HOME. L3-4:  OPENING PRESSURE 20 PSI.  PRESSURE  AT PAIN RESPONSE 20 PSI. QUANTITY OF CONTRAST 3 ML. LEVEL OF PAIN WAS 10 ON A SCALE OF 1 TO 10.  SHE HAD A HEAVY FEELING IN THE CENTER OF HER BACK WHICH WAS SOMEWHAT LIKE THE PAIN SHE EXPERIENCED AT HOME.  SHE FELT AS IF A CINDER BLOCK WAS LYING ON HER BACK. L4-5:  OPENING PRESSURE 30 PSI.  PRESSURE AT PAIN RESPONSE 30 PSI.  QUANTITY OF CONTRAST WAS 2.5 ML.  LEVEL OF PAIN WAS 10 ON A SCALE ON 1 TO 10.  SHE EXPERIENCED SEVERE PAIN IN THE CENTER OF HER BACK AND RADIATING DOWN HER LEFT LEG. THIS  WAS LIKE THE PAIN SHE HAD AT HOME. L5-S1:  OPENING PRESSURE 20 PSI.  PRESSURE AT PAIN RESPONSE 50 PSI.  QUANTITY OF CONTRAST 3 ML. LEVEL OF PAIN WAS 6 ON A SCALE OF 1 TO 10.  SHE EXPERIENCED SOME MID BACK PAIN EXTENDING TO THE RIGHT, BUT THIS DID NOT CORRELATE WITH HER PAIN AT HOME. THE L2-3 DISK WAS MORPHOLOGICALLY NORMAL, AND THE L5-S1 DISK DID NOT APPEAR TO SHOW ANY SIGNIFICANT ABNORMALITY AS WELL.  THE L4-5 DISK WAS MARKEDLY DEGENERATED WITH CONTRAST EXTENDING RIGHT AND LEFT AS WELL AS INTO THE EPIDURAL SPACE.  THE L3-4 DISK SHOWED AN ANNULAR TEAR OFF THE RIGHT.  THERE WAS ALSO EXTENSION POSTERIORLY INTO THE CANAL. POST MYELOGRAM CT THE LOWER FOUR DISK SPACES WERE EXAMINED: L2-3:  NORMAL INTERSPACE. L3-4:  ANNULAR RENT IN THE FORAMEN TO THE RIGHT.  RIGHT L3 AND POSSIBLY RIGHT L4 NERVE ROOT ENCROACHMENT WERE OBSERVED. L4-5:  DIFFUSE DISKAL DEGENERATION WITH LOSS OF INTERSPACE HEIGHT,  OSTEOPHYTE FORMATION, AND MULTIPLE ANNULAR RENTS EXTENDING CIRCUMFERENTIALLY.  THERE WAS NO DISCRETE ANNULAR RENT ON THE LEFT.  THERE DID APPEAR TO BE SIGNIFICANT FORAMINAL NARROWING ON THE LEFT DUE TO A COMBINATION OF BONY OVERGROWTH FROM THE FACET AS WELL AS OSTEOPHYTE FORMATION. L5-S1:  SMALL AMOUNT OF CONTRAST EXTENDS TO THE RIGHT AND MAY BE SECONDARY TO THE NEEDLE PLACEMENT. NO OTHER ANNULAR RENTS ARE SEEN. IMPRESSION 1)   ABNORMAL  DISKOGRAPHY AT L4-5 WITH CONCORDANT REPRODUCTION OF THE PATIENT'S PAIN; THERE IS DIFFUSE DISKAL DEGENERATION WITH THE MOST PROMINENT LEFT SIDED ENCROACHMENT BEING IN THE FORAMEN. 2)   ABNORMAL LUMBAR DISKOGRAPHY AT L3-4 WITH REPRODUCTION OF BACK PAIN, BUT NO RADIATION DOWN THE LEFT LEG.  CT IS NOTABLE FOR AN ANNULAR RENT EXTENDING INTO THE FORAMEN ON THE RIGHT. 3)   INJECTION OF L2-3 AND L5-S1 DID GENERATE SOME BACK PAIN FOR THE PATIENT, BUT THERE WAS NOT CONVINCING MORPHOLOGIC ABNORMALITIES OF THE DISK.  Lumbar DG 2-3 views: Results for orders placed in visit on 08/20/02 DG Lumbar Spine 2-3 Views Narrative FINDINGS CLINICAL DATA:  LUMBAR HNP WITH MYELOPATHY FOR L3-5 PLIF. C-ARM FLUOROSCOPY X THREE FLUOROSCOPY WAS UTILIZED BY THE REQUESTING PHYSICIAN. IMPRESSION NO RADIOGRAPHIC INTERPRETATION. LUMBAR SPINE, TWO VIEWS C-ARM SPOT FILMS ARE OBTAINED IN THE PA AND CROSS TABLE LATERAL PROJECTIONS REVEALING PLACEMENT OF PAIRED TRANSPEDICULAR SCREWS AT L3, L4 AND L5.  INTERCONNECTING SCREWS POSTERIORLY BY METALLIC BARS.  HARDWARE APPEARS IN SATISFACTORY POSITION AND ALIGNMENT.  NO SPONDYLOLISTHESIS.  SURGICAL RETRACTORS ARE IN PLACE AT THE LAMINECTOMY SITE. IMPRESSION INTRAOPERATIVE C-ARM FILMS SUGGEST SATISFACTORY PLACEMENT OF TRANSPEDICULAR SCREWS AT L3 TO L5.  Lumbar DG (Complete) 4+V: Results for orders placed during the hospital encounter of 07/20/16 DG Lumbar Spine Complete  Narrative CLINICAL DATA:  Low back pain for 1 week extending into the left lower extremity.  EXAM: LUMBAR SPINE - COMPLETE 4+ VIEW  COMPARISON:  MRI of the lumbar spine 01/06/2016. Lumbar spine radiographs 08/19/2013.  FINDINGS: There is lumbar fusion is again noted at at L3-4 and L4-5. Previous pedicle screws at L3 and L4 have been removed.  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 Lucency is noted about the L5 and S1 screws. Disc spacer is in satisfactory position.  Bilateral total hip arthroplasty is  noted.  The soft tissues are unremarkable.  IMPRESSION: 1. Interval L5-S1 fusion. 2. Lucency about the L5 and S1 pedicle screws suggests motion an loosening. 3. Solid lumbar fusion at L3-4 and L4-5.   Electronically Signed By: Marin Roberts M.D. On: 07/20/2016 15:57  Lumbar DG Diskogram views: Results for orders placed in visit on 07/16/02 DG Diskogram Lumbar  Narrative FINDINGS CLINICAL DATA:  BACK AND LEFT LEG PAIN. LUMBAR DISKOGRAM THE PATIENT WAS GIVEN EXTENSIVE INFORMED CONSENT PRIOR TO THE PROCEDURE INCLUDING THE RISK OF PAIN, INFECTION, AND NEUROLOGIC DEFICIT.  SPECIFICALLY, THE RISK OF DISKITIS AND OSTEOMYELITIS WERE DISCUSSED.  THE PATIENT AGREED TO PROCEED.  THE PATIENT RECEIVED VANCOMYCIN AS OUTLINED ON THE NURSING SHEET PRIOR TO THE PROCEDURE.  AN APPROPRIATE AMOUNT OF VANCOMYCIN WAS ADDED TO THE CONTRAST (OMNIPAQUE 180).  HER BACK WAS PREPARED WITH A STERILE SCRUB SPONGE FOR FIVE MINUTES. FOLLOWED BY APPLICATION OF BETADINE SOLUTION.  STERILE DRAPES WERE APPLIED.  STRICT STERILE TECHNIQUE WAS USED BY EVERYONE IN THE ROOM.  INCREMENTAL DOSES OF VERSED WERE USED FOR CONSCIOUS SEDATION.  A RIGHT PARASPINOUS APPROACH WAS TAKEN TO THE LOWER FOUR DISK SPACES AS FOLLOWS: L2-3:  OPENING PRESSURE 20 PSI.  PRESSURE AT PAIN RESPONSE 80 PSI. QUANTITY OF CONTRAST 2.5 ML. LEVEL OF PAIN WAS 6 ON A SCALE OF 1 TO 10.  SHE FELT PRESSURE IN THE BACK WHICH WAS NOT TYPICAL FOR HER PAIN AT HOME. L3-4:  OPENING PRESSURE 20 PSI.  PRESSURE AT PAIN RESPONSE 20 PSI. QUANTITY OF CONTRAST 3 ML. LEVEL OF PAIN WAS 10 ON A SCALE OF 1 TO 10.  SHE HAD A HEAVY FEELING IN THE CENTER OF HER BACK WHICH WAS SOMEWHAT LIKE THE PAIN SHE EXPERIENCED AT HOME.  SHE FELT AS IF A CINDER BLOCK WAS LYING ON HER BACK. L4-5:  OPENING PRESSURE 30 PSI.  PRESSURE AT PAIN RESPONSE 30 PSI.  QUANTITY OF CONTRAST WAS 2.5 ML.  LEVEL OF PAIN WAS 10 ON A SCALE ON 1 TO 10.  SHE EXPERIENCED SEVERE PAIN IN THE CENTER OF  HER BACK AND RADIATING DOWN HER LEFT LEG. THIS  WAS LIKE THE PAIN SHE HAD AT HOME. L5-S1:  OPENING PRESSURE 20 PSI.  PRESSURE AT PAIN RESPONSE 50 PSI.  QUANTITY OF CONTRAST 3 ML. LEVEL OF PAIN WAS 6 ON A SCALE OF 1 TO 10.  SHE EXPERIENCED SOME MID BACK PAIN EXTENDING TO THE RIGHT, BUT THIS DID NOT CORRELATE WITH HER PAIN AT HOME. THE L2-3 DISK WAS MORPHOLOGICALLY NORMAL, AND THE L5-S1 DISK DID NOT APPEAR TO SHOW ANY SIGNIFICANT ABNORMALITY AS WELL.  THE L4-5 DISK WAS MARKEDLY DEGENERATED WITH CONTRAST EXTENDING RIGHT AND LEFT AS WELL AS INTO THE EPIDURAL SPACE.  THE L3-4 DISK SHOWED AN ANNULAR TEAR OFF THE RIGHT.  THERE WAS ALSO EXTENSION POSTERIORLY INTO THE CANAL. POST MYELOGRAM CT THE LOWER FOUR DISK SPACES WERE EXAMINED: L2-3:  NORMAL INTERSPACE. L3-4:  ANNULAR RENT IN THE FORAMEN TO THE RIGHT.  RIGHT L3 AND POSSIBLY RIGHT L4 NERVE ROOT ENCROACHMENT WERE OBSERVED. L4-5:  DIFFUSE DISKAL DEGENERATION WITH LOSS OF INTERSPACE HEIGHT,  OSTEOPHYTE FORMATION, AND MULTIPLE  ANNULAR RENTS EXTENDING CIRCUMFERENTIALLY.  THERE WAS NO DISCRETE ANNULAR RENT ON THE LEFT.  THERE DID APPEAR TO BE SIGNIFICANT FORAMINAL NARROWING ON THE LEFT DUE TO A COMBINATION OF BONY OVERGROWTH FROM THE FACET AS WELL AS OSTEOPHYTE FORMATION. L5-S1:  SMALL AMOUNT OF CONTRAST EXTENDS TO THE RIGHT AND MAY BE SECONDARY TO THE NEEDLE PLACEMENT. NO OTHER ANNULAR RENTS ARE SEEN. IMPRESSION 1)   ABNORMAL DISKOGRAPHY AT L4-5 WITH CONCORDANT REPRODUCTION OF THE PATIENT'S PAIN; THERE IS DIFFUSE DISKAL DEGENERATION WITH THE MOST PROMINENT LEFT SIDED ENCROACHMENT BEING IN THE FORAMEN. 2)   ABNORMAL LUMBAR DISKOGRAPHY AT L3-4 WITH REPRODUCTION OF BACK PAIN, BUT NO RADIATION DOWN THE LEFT LEG.  CT IS NOTABLE FOR AN ANNULAR RENT EXTENDING INTO THE FORAMEN ON THE RIGHT. 3)   INJECTION OF L2-3 AND L5-S1 DID GENERATE SOME BACK PAIN FOR THE PATIENT, BUT THERE WAS NOT CONVINCING MORPHOLOGIC ABNORMALITIES OF THE DISK.  Knee  Imaging: Knee-L DG 4 views: Results for orders placed in visit on 03/07/21 DG Knee Complete 4 Views Left  Narrative CLINICAL DATA:  Knee pain  EXAM: LEFT KNEE - COMPLETE 4+ VIEW  COMPARISON:  None.  FINDINGS: Three views study shows no fracture or dislocation. Near complete loss of joint spaceh is noted in the medial compartment with hypertrophic spurring visible in all 3 compartments. No substantial joint effusion.  IMPRESSION: Moderately advanced tricompartmental osteoarthritis without joint effusion.   Electronically Signed By: Kennith Center M.D. On: 03/08/2021 09:42  Complexity Note: Imaging results reviewed.                         ROS  Cardiovascular: {Hx; Cardiovascular History:210120525} Pulmonary or Respiratory: {Hx; Pumonary and/or Respiratory History:210120523} Neurological: {Hx; Neurological:210120504} Psychological-Psychiatric: {Hx; Psychological-Psychiatric History:210120512} Gastrointestinal: {Hx; Gastrointestinal:210120527} Genitourinary: {Hx; Genitourinary:210120506} Hematological: {Hx; Hematological:210120510} Endocrine: {Hx; Endocrine history:210120509} Rheumatologic: {Hx; Rheumatological:210120530} Musculoskeletal: {Hx; Musculoskeletal:210120528} Work History: {Hx; Work history:210120514}  Allergies  Maria Hamilton is allergic to ace inhibitors, ciprofloxacin, fentanyl, hctz [hydrochlorothiazide], latex, morphine and related, penicillin g benzathine, isovue m [iopamidol], and tape.  Laboratory Chemistry Profile   Renal Lab Results  Component Value Date   BUN 8 12/19/2022   CREATININE 0.84 12/19/2022   BCR 14 02/05/2020   GFR 69.78 12/19/2022   GFRAA >60 07/10/2018   GFRNONAA >60 04/05/2021   PROTEINUR NEGATIVE 07/10/2018     Electrolytes Lab Results  Component Value Date   NA 139 12/19/2022   K 4.1 12/19/2022   CL 103 12/19/2022   CALCIUM 9.3 12/19/2022   MG 1.8 12/14/2020   PHOS 4.4 12/14/2020     Hepatic Lab Results   Component Value Date   AST 15 12/19/2022   ALT 8 12/19/2022   ALBUMIN 4.1 12/19/2022   ALKPHOS 91 12/19/2022   LIPASE 25 04/05/2021     ID Lab Results  Component Value Date   HIV Non Reactive 12/12/2020   SARSCOV2NAA POSITIVE (A) 12/12/2020   MRSAPCR NEGATIVE 10/15/2016     Bone Lab Results  Component Value Date   25OHVITD1 14 (L) 09/25/2016   25OHVITD2 6.4 09/25/2016   25OHVITD3 7.7 09/25/2016     Endocrine Lab Results  Component Value Date   GLUCOSE 105 (H) 12/19/2022   GLUCOSEU NEGATIVE 07/10/2018   HGBA1C 5.5 08/07/2019   TSH 1.59 10/10/2022     Neuropathy Lab Results  Component Value Date   VITAMINB12 190 (L) 10/10/2022   FOLATE 8.6 10/10/2022   HGBA1C 5.5 08/07/2019   HIV Non Reactive 12/12/2020  CNS No results found for: "COLORCSF", "APPEARCSF", "RBCCOUNTCSF", "WBCCSF", "POLYSCSF", "LYMPHSCSF", "EOSCSF", "PROTEINCSF", "GLUCCSF", "JCVIRUS", "CSFOLI", "IGGCSF", "LABACHR", "ACETBL"   Inflammation (CRP: Acute  ESR: Chronic) Lab Results  Component Value Date   CRP <1.0 10/10/2022   ESRSEDRATE 49 (H) 10/10/2022   LATICACIDVEN 1.0 10/24/2016     Rheumatology Lab Results  Component Value Date   RF <14 10/10/2022   ANA NEGATIVE 10/10/2022     Coagulation Lab Results  Component Value Date   PLT 235 04/05/2021     Cardiovascular Lab Results  Component Value Date   CKTOTAL 72 04/02/2018   TROPONINI <0.03 07/13/2016   HGB 11.4 (L) 04/05/2021   HCT 34.6 (L) 04/05/2021     Screening Lab Results  Component Value Date   SARSCOV2NAA POSITIVE (A) 12/12/2020   MRSAPCR NEGATIVE 10/15/2016   HIV Non Reactive 12/12/2020     Cancer No results found for: "CEA", "CA125", "LABCA2"   Allergens No results found for: "ALMOND", "APPLE", "ASPARAGUS", "AVOCADO", "BANANA", "BARLEY", "BASIL", "BAYLEAF", "GREENBEAN", "LIMABEAN", "WHITEBEAN", "BEEFIGE", "REDBEET", "BLUEBERRY", "BROCCOLI", "CABBAGE", "MELON", "CARROT", "CASEIN", "CASHEWNUT", "CAULIFLOWER",  "CELERY"     Note: Lab results reviewed.  PFSH  Drug: Maria Hamilton  reports no history of drug use. Alcohol:  reports no history of alcohol use. Tobacco:  reports that she has never smoked. She has never used smokeless tobacco. Medical:  has a past medical history of Acute anxiety (08/03/2015), Acute bronchitis (12/31/2016), Acute pancreatitis (10/14/2016), Altered mental status (10/12/2016), Anxiety, Bronchitis, Chronic hip pain, Chronic pain syndrome, Depression, Edema, Fatigue, Fibromyalgia, GERD (gastroesophageal reflux disease), Headache, Hypertension, Insomnia, Low back pain, Right upper quadrant abdominal pain, and Vitamin D deficiency. Family: family history includes Alcohol abuse in her father; Cancer in her father; Heart disease in her mother.  Past Surgical History:  Procedure Laterality Date   ABDOMINAL HYSTERECTOMY     BACK SURGERY     BREAST BIOPSY Left 90s   benign   BREAST SURGERY     CHOLECYSTECTOMY N/A 10/16/2016   Procedure: LAPAROSCOPIC CHOLECYSTECTOMY WITH INTRAOPERATIVE CHOLANGIOGRAM;  Surgeon: Tiney Rouge III, MD;  Location: [facility] ORS;  Service: General;  Laterality: N/A;   COLONOSCOPY WITH PROPOFOL N/A 12/22/2021   Procedure: COLONOSCOPY WITH PROPOFOL;  Surgeon: Wyline Mood, MD;  Location: [facility] ENDOSCOPY;  Service: Gastroenterology;  Laterality: N/A;   cyst removal from wrist     ECTOPIC PREGNANCY SURGERY     HIP SURGERY     x4   OOPHORECTOMY     Active Ambulatory Problems    Diagnosis Date Noted   GERD (gastroesophageal reflux disease) 08/03/2015   Rheumatoid factor positive 08/03/2015   Chronic fatigue 08/03/2015   Vitamin D deficiency 08/03/2015   Insomnia 08/03/2015   Fibromyalgia 08/03/2015   Chronic low back pain (Primary Source of Pain) (Left) 08/03/2015   Chronic pain syndrome 08/03/2015   Hypertension 08/03/2015   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) 04/22/2012   Long term current use of opiate analgesic 09/24/2016   Long term  prescription opiate use 09/24/2016   Opiate use 09/24/2016   Encounter for therapeutic drug level monitoring 09/24/2016   Encounter for pain management planning 09/24/2016   Rheumatoid arthritis, multiple sites (positive RF) 09/24/2016   Chronic lower extremity pain (Secondary source of pain) (Left) 09/24/2016   Lumbar facet arthropathy (Bilateral) 09/24/2016   Failed back surgical syndrome (x 3) 09/24/2016   Epidural fibrosis 09/24/2016   Neurogenic pain 09/24/2016   Musculoskeletal pain 09/24/2016   Chronic lumbar radicular pain (L5/S1 dermatome) (Left)  09/24/2016   History of total hip replacement, bilateral 09/24/2016   Hypokalemia 09/27/2016   Constipation    Therapeutic opioid-induced constipation (OIC) 11/20/2016   Palpitations 11/26/2016   Epigastric pain 11/26/2016   Memory difficulty 03/06/2017   Polyneuropathy 03/06/2017   Post herpetic neuralgia 03/12/2017   Abnormal MRI, lumbar spine (03/06/2017) 03/12/2017   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) 03/20/2017   History of lumbar fusion (L3-4, L4-5, and L5-S1) 03/20/2017   Vertigo 03/25/2017   Chronic foot numbness (Left) 04/02/2017   RLS (restless legs syndrome) 10/10/2017   Ankle pain 10/16/2017   Anxiety and depression 10/16/2017   History of allergy to radiographic contrast media 10/29/2017   Degeneration of lumbar intervertebral disc 10/30/2017   Osteoarthritis of hip 10/30/2017   Vitamin B 12 deficiency 04/02/2018   Pain of both shoulder joints 04/02/2018   Spondylosis without myelopathy or radiculopathy, lumbosacral region 04/28/2018   Chronic hip pain after total replacement of hip joint (Left) 04/28/2018   Chronic hip pain after total replacement of hip joint (Right) 04/28/2018   Chronic pain of hip  (Bilateral) (L>R) 04/28/2018   Chronic sacroiliac joint pain (Bilateral) (L>R) 04/28/2018   Lumbar facet syndrome (Bilateral) (L>R) 04/28/2018   Numbness and tingling of foot 11/19/2018    Hot flashes due to menopause 11/19/2018   Toe swelling 08/07/2019   Leg edema 01/12/2020   Hot flashes 01/29/2020   Leg swelling 01/29/2020   Left foot pain 01/29/2020   Left shoulder pain 01/29/2020   Falls 05/02/2020   Muscle cramps 05/02/2020   History of COVID-19 12/12/2020   Generalized weakness 12/12/2020   Unsteady gait 12/12/2020   COVID-19 12/14/2020   Hyperlipidemia 01/02/2021   Left knee pain 03/07/2021   Hair loss 03/07/2021   Arthritis of knee 12/19/2022   Bursitis of hip 12/19/2022   Contusion of knee 12/19/2022   Vaginal lesion 01/23/2023   Resolved Ambulatory Problems    Diagnosis Date Noted   Headache 08/03/2015   Acute anxiety 08/03/2015   Depression 08/03/2015   Abnormal vaginal bleeding 08/03/2015   Cellulitis due to MRSA 08/03/2015   Urticaria 08/03/2015   Tinea 08/03/2015   Arthralgia of hip 03/17/2012   Acute pancreatitis 10/14/2016   Cholecystitis    Altered mental status 10/12/2016   Right upper quadrant abdominal pain    Acute bronchitis 12/31/2016   Shingles 03/06/2017   Night sweats 08/13/2017   Supraclavicular fossa fullness 08/13/2017   Viral URI 10/16/2017   Acute anxiety 08/03/2015   Past Medical History:  Diagnosis Date   Anxiety    Bronchitis    Chronic hip pain    Edema    Fatigue    Low back pain    Constitutional Exam  General appearance: Well nourished, well developed, and well hydrated. In no apparent acute distress There were no vitals filed for this visit. BMI Assessment: Estimated body mass index is 30.75 kg/m as calculated from the following:   Height as of 01/23/23: 5\' 9"  (1.753 m).   Weight as of 01/23/23: 208 lb 3.2 oz (94.4 kg).  BMI interpretation table: BMI level Category Range association with higher incidence of chronic pain  <18 kg/m2 Underweight   18.5-24.9 kg/m2 Ideal body weight   25-29.9 kg/m2 Overweight Increased incidence by 20%  30-34.9 kg/m2 Obese (Class I) Increased incidence by 68%  35-39.9  kg/m2 Severe obesity (Class II) Increased incidence by 136%  >40 kg/m2 Extreme obesity (Class III) Increased incidence by 254%   Patient's  current BMI Ideal Body weight  There is no height or weight on file to calculate BMI. Ideal body weight: 66.2 kg (145 lb 15.1 oz) Adjusted ideal body weight: 77.5 kg (170 lb 13.5 oz)   BMI Readings from Last 4 Encounters:  01/23/23 30.75 kg/m  01/15/23 28.21 kg/m  12/19/22 27.42 kg/m  10/10/22 26.57 kg/m   Wt Readings from Last 4 Encounters:  01/23/23 208 lb 3.2 oz (94.4 kg)  01/15/23 191 lb (86.6 kg)  12/19/22 207 lb 12.8 oz (94.3 kg)  10/10/22 201 lb 6.4 oz (91.4 kg)    Psych/Mental status: Alert, oriented x 3 (person, place, & time)       Eyes: PERLA Respiratory: No evidence of acute respiratory distress  Assessment  Primary Diagnosis & Pertinent Problem List: There were no encounter diagnoses.  Visit Diagnosis (New problems to examiner): No diagnosis found. Plan of Care (Initial workup plan)  Note: Maria Hamilton was reminded that as per protocol, today's visit has been an evaluation only. We have not taken over the patient's controlled substance management.  Problem-specific plan: No problem-specific Assessment & Plan notes found for this encounter.  Lab Orders  No laboratory test(s) ordered today   Imaging Orders  No imaging studies ordered today   Referral Orders  No referral(s) requested today   Procedure Orders    No procedure(s) ordered today   Pharmacotherapy (current): Medications ordered:  No orders of the defined types were placed in this encounter.  Medications administered during this visit: Maria Hamilton had no medications administered during this visit.   Analgesic Pharmacotherapy:  Opioid Analgesics: For patients currently taking or requesting to take opioid analgesics, in accordance with [facility] Guidelines, we will assess their risks and indications for the use of these  substances. After completing our evaluation, we may offer recommendations, but we no longer take patients for medication management. The prescribing physician will ultimately decide, based on his/her training and level of comfort whether to adopt any of the recommendations, including whether or not to prescribe such medicines.  Membrane stabilizer: To be determined at a later time  Muscle relaxant: To be determined at a later time  NSAID: To be determined at a later time  Other analgesic(s): To be determined at a later time   Interventional management options: Maria Hamilton was informed that there is no guarantee that she would be a candidate for interventional therapies. The decision will be based on the results of diagnostic studies, as well as Maria Hamilton risk profile.  Procedure(s) under consideration:  Pending results of ordered studies      Interventional Therapies  Risk Factors  Considerations:     Planned  Pending:   See above for possible orders   Under consideration:   Pending completion of evaluation   Completed:   None at this time   Completed by other providers:   None at this time   Therapeutic  Palliative (PRN) options:   None established      Provider-requested follow-up: No follow-ups on file.  Future Appointments  Date Time Provider Department Center  01/30/2023  9:00 AM Delano Metz, MD [facility] None  03/20/2023 10:30 AM Glori Luis, MD [facility]    Duration of encounter: *** minutes.  Total time on encounter, as per AMA guidelines included both the face-to-face and non-face-to-face time personally spent by the physician and/or other qualified health care professional(s) on the day of the encounter (includes time in activities that require the  physician or other qualified health care professional and does not include time in activities normally performed by clinical staff). Physician's time may include the following activities when  performed: Preparing to see the patient (e.g., pre-charting review of records, searching for previously ordered imaging, lab work, and nerve conduction tests) Review of prior analgesic pharmacotherapies. Reviewing PMP Interpreting ordered tests (e.g., lab work, imaging, nerve conduction tests) Performing post-procedure evaluations, including interpretation of diagnostic procedures Obtaining and/or reviewing separately obtained history Performing a medically appropriate examination and/or evaluation Counseling and educating the patient/family/caregiver Ordering medications, tests, or procedures Referring and communicating with other health care professionals (when not separately reported) Documenting clinical information in the electronic or other health record Independently interpreting results (not separately reported) and communicating results to the patient/ family/caregiver Care coordination (not separately reported)  Note by: Oswaldo Done, MD Date: 01/30/2023; Time: 2:44 PM

## 2023-01-28 ENCOUNTER — Encounter: Payer: Self-pay | Admitting: Family Medicine

## 2023-01-28 LAB — HERPES SIMPLEX VIRUS CULTURE
MICRO NUMBER:: 14532548
SPECIMEN QUALITY:: ADEQUATE

## 2023-01-28 LAB — TIQ- AMBIGUOUS ORDER

## 2023-01-30 ENCOUNTER — Ambulatory Visit: Payer: Medicare HMO | Attending: Pain Medicine | Admitting: Pain Medicine

## 2023-01-30 ENCOUNTER — Encounter: Payer: Self-pay | Admitting: Pain Medicine

## 2023-01-30 VITALS — BP 147/85 | HR 74 | Temp 97.4°F | Ht 73.0 in | Wt 211.0 lb

## 2023-01-30 DIAGNOSIS — G629 Polyneuropathy, unspecified: Secondary | ICD-10-CM | POA: Diagnosis not present

## 2023-01-30 DIAGNOSIS — M4310 Spondylolisthesis, site unspecified: Secondary | ICD-10-CM | POA: Diagnosis not present

## 2023-01-30 DIAGNOSIS — G894 Chronic pain syndrome: Secondary | ICD-10-CM | POA: Diagnosis not present

## 2023-01-30 DIAGNOSIS — M79605 Pain in left leg: Secondary | ICD-10-CM | POA: Insufficient documentation

## 2023-01-30 DIAGNOSIS — E559 Vitamin D deficiency, unspecified: Secondary | ICD-10-CM | POA: Diagnosis not present

## 2023-01-30 DIAGNOSIS — G8929 Other chronic pain: Secondary | ICD-10-CM | POA: Diagnosis present

## 2023-01-30 DIAGNOSIS — M4316 Spondylolisthesis, lumbar region: Secondary | ICD-10-CM

## 2023-01-30 DIAGNOSIS — M4807 Spinal stenosis, lumbosacral region: Secondary | ICD-10-CM | POA: Diagnosis not present

## 2023-01-30 DIAGNOSIS — R200 Anesthesia of skin: Secondary | ICD-10-CM | POA: Diagnosis not present

## 2023-01-30 DIAGNOSIS — M79604 Pain in right leg: Secondary | ICD-10-CM

## 2023-01-30 DIAGNOSIS — E538 Deficiency of other specified B group vitamins: Secondary | ICD-10-CM | POA: Insufficient documentation

## 2023-01-30 DIAGNOSIS — M5416 Radiculopathy, lumbar region: Secondary | ICD-10-CM | POA: Insufficient documentation

## 2023-01-30 DIAGNOSIS — R202 Paresthesia of skin: Secondary | ICD-10-CM

## 2023-01-30 DIAGNOSIS — Z789 Other specified health status: Secondary | ICD-10-CM | POA: Insufficient documentation

## 2023-01-30 DIAGNOSIS — R94130 Abnormal response to nerve stimulation, unspecified: Secondary | ICD-10-CM | POA: Insufficient documentation

## 2023-01-30 DIAGNOSIS — M899 Disorder of bone, unspecified: Secondary | ICD-10-CM | POA: Diagnosis not present

## 2023-01-30 DIAGNOSIS — R700 Elevated erythrocyte sedimentation rate: Secondary | ICD-10-CM | POA: Diagnosis not present

## 2023-01-30 DIAGNOSIS — M961 Postlaminectomy syndrome, not elsewhere classified: Secondary | ICD-10-CM | POA: Insufficient documentation

## 2023-01-30 DIAGNOSIS — M5136 Other intervertebral disc degeneration, lumbar region: Secondary | ICD-10-CM | POA: Diagnosis not present

## 2023-01-30 DIAGNOSIS — R937 Abnormal findings on diagnostic imaging of other parts of musculoskeletal system: Secondary | ICD-10-CM | POA: Insufficient documentation

## 2023-01-30 DIAGNOSIS — Z79899 Other long term (current) drug therapy: Secondary | ICD-10-CM | POA: Insufficient documentation

## 2023-01-30 NOTE — Progress Notes (Signed)
Safety precautions to be maintained throughout the outpatient stay will include: orient to surroundings, keep bed in low position, maintain call bell within reach at all times, provide assistance with transfer out of bed and ambulation.  

## 2023-01-30 NOTE — Patient Instructions (Signed)
______________________________________________________________________  Procedure instructions  Do not eat or drink fluids (other than water) for 6 hours before your procedure  No water for 2 hours before your procedure  Take your blood pressure medicine with a sip of water  Arrive 30 minutes before your appointment  Carefully read the "Preparing for your procedure" detailed instructions  If you have questions call us at [phone]  _____________________________________________________________________    ______________________________________________________________________  Preparing for your procedure  During your procedure appointment there will be: No Prescription Refills. No disability issues to discussed. No medication changes or discussions.  Instructions: Food intake: Avoid eating anything solid for at least 8 hours prior to your procedure. Clear liquid intake: You may take clear liquids such as water up to 2 hours prior to your procedure. (No carbonated drinks. No soda.) Transportation: Unless otherwise stated by your physician, bring a driver. Morning Medicines: Except for blood thinners, take all of your other morning medications with a sip of water. Make sure to take your heart and blood pressure medicines. If your blood pressure's lower number is above 100, the case will be rescheduled. Blood thinners: Make sure to stop your blood thinners as instructed.  If you take a blood thinner, but were not instructed to stop it, call our office [phone] and ask to talk to a nurse. Not stopping a blood thinner prior to certain procedures could lead to serious complications. Diabetics on insulin: Notify the staff so that you can be scheduled 1st case in the morning. If your diabetes requires high dose insulin, take only  of your normal insulin dose the morning of the procedure and notify the staff that you have done so. Preventing infections: Shower with an  antibacterial soap the morning of your procedure.  Build-up your immune system: Take 1000 mg of Vitamin C with every meal (3 times a day) the day prior to your procedure. Antibiotics: Inform the nursing staff if you are taking any antibiotics or if you have any conditions that may require antibiotics prior to procedures. (Example: recent joint implants)   Pregnancy: If you are pregnant make sure to notify the nursing staff. Not doing so may result in injury to the fetus, including death.  Sickness: If you have a cold, fever, or any active infections, call and cancel or reschedule your procedure. Receiving steroids while having an infection may result in complications. Arrival: You must be in the facility at least 30 minutes prior to your scheduled procedure. Tardiness: Your scheduled time is also the cutoff time. If you do not arrive at least 15 minutes prior to your procedure, you will be rescheduled.  Children: Do not bring any children with you. Make arrangements to keep them home. Dress appropriately: There is always a possibility that your clothing may get soiled. Avoid long dresses. Valuables: Do not bring any jewelry or valuables.  Reasons to call and reschedule or cancel your procedure: (Following these recommendations will minimize the risk of a serious complication.) Surgeries: Avoid having procedures within 2 weeks of any surgery. (Avoid for 2 weeks before or after any surgery). Flu Shots: Avoid having procedures within 2 weeks of a flu shots or . (Avoid for 2 weeks before or after immunizations). Barium: Avoid having a procedure within 7-10 days after having had a radiological study involving the use of radiological contrast. (Myelograms, Barium swallow or enema study). Heart attacks: Avoid any elective procedures or surgeries for the initial 6 months after a "Myocardial Infarction" (Heart Attack). Blood thinners: It  is imperative that you stop these medications before procedures. Let us  know if you if you take any blood thinner.  Infection: Avoid procedures during or within two weeks of an infection (including chest colds or gastrointestinal problems). Symptoms associated with infections include: Localized redness, fever, chills, night sweats or profuse sweating, burning sensation when voiding, cough, congestion, stuffiness, runny nose, sore throat, diarrhea, nausea, vomiting, cold or Flu symptoms, recent or current infections. It is specially important if the infection is over the area that we intend to treat. Heart and lung problems: Symptoms that may suggest an active cardiopulmonary problem include: cough, chest pain, breathing difficulties or shortness of breath, dizziness, ankle swelling, uncontrolled high or unusually low blood pressure, and/or palpitations. If you are experiencing any of these symptoms, cancel your procedure and contact your primary care physician for an evaluation.  Remember:  Regular Business hours are:  Monday to Thursday 8:00 AM to 4:00 PM  Provider's Schedule: Milinda Pointer, MD:  Procedure days: Tuesday and Thursday 7:30 AM to 4:00 PM  Gillis Santa, MD:  Procedure days: Monday and Wednesday 7:30 AM to 4:00 PM  ______________________________________________________________________    ____________________________________________________________________________________________  General Risks and Possible Complications  Patient Responsibilities: It is important that you read this as it is part of your informed consent. It is our duty to inform you of the risks and possible complications associated with treatments offered to you. It is your responsibility as a patient to read this and to ask questions about anything that is not clear or that you believe was not covered in this document.  Patient's Rights: You have the right to refuse treatment. You also have the right to change your mind, even after initially having agreed to have the treatment  done. However, under this last option, if you wait until the last second to change your mind, you may be charged for the materials used up to that point.  Introduction: Medicine is not an Chief Strategy Officer. Everything in Medicine, including the lack of treatment(s), carries the potential for danger, harm, or loss (which is by definition: Risk). In Medicine, a complication is a secondary problem, condition, or disease that can aggravate an already existing one. All treatments carry the risk of possible complications. The fact that a side effects or complications occurs, does not imply that the treatment was conducted incorrectly. It must be clearly understood that these can happen even when everything is done following the highest safety standards.  No treatment: You can choose not to proceed with the proposed treatment alternative. The "PRO(s)" would include: avoiding the risk of complications associated with the therapy. The "CON(s)" would include: not getting any of the treatment benefits. These benefits fall under one of three categories: diagnostic; therapeutic; and/or palliative. Diagnostic benefits include: getting information which can ultimately lead to improvement of the disease or symptom(s). Therapeutic benefits are those associated with the successful treatment of the disease. Finally, palliative benefits are those related to the decrease of the primary symptoms, without necessarily curing the condition (example: decreasing the pain from a flare-up of a chronic condition, such as incurable terminal cancer).  General Risks and Complications: These are associated to most interventional treatments. They can occur alone, or in combination. They fall under one of the following six (6) categories: no benefit or worsening of symptoms; bleeding; infection; nerve damage; allergic reactions; and/or death. No benefits or worsening of symptoms: In Medicine there are no guarantees, only probabilities. No  healthcare provider can ever guarantee that a  medical treatment will work, they can only state the probability that it may. Furthermore, there is always the possibility that the condition may worsen, either directly, or indirectly, as a consequence of the treatment. Bleeding: This is more common if the patient is taking a blood thinner, either prescription or over the counter (example: Goody Powders, Fish oil, Aspirin, Garlic, etc.), or if suffering a condition associated with impaired coagulation (example: Hemophilia, cirrhosis of the liver, low platelet counts, etc.). However, even if you do not have one on these, it can still happen. If you have any of these conditions, or take one of these drugs, make sure to notify your treating physician. Infection: This is more common in patients with a compromised immune system, either due to disease (example: diabetes, cancer, human immunodeficiency virus [HIV], etc.), or due to medications or treatments (example: therapies used to treat cancer and rheumatological diseases). However, even if you do not have one on these, it can still happen. If you have any of these conditions, or take one of these drugs, make sure to notify your treating physician. Nerve Damage: This is more common when the treatment is an invasive one, but it can also happen with the use of medications, such as those used in the treatment of cancer. The damage can occur to small secondary nerves, or to large primary ones, such as those in the spinal cord and brain. This damage may be temporary or permanent and it may lead to impairments that can range from temporary numbness to permanent paralysis and/or brain death. Allergic Reactions: Any time a substance or material comes in contact with our body, there is the possibility of an allergic reaction. These can range from a mild skin rash (contact dermatitis) to a severe systemic reaction (anaphylactic reaction), which can result in death. Death: In  general, any medical intervention can result in death, most of the time due to an unforeseen complication. ____________________________________________________________________________________________    ____________________________________________________________________________________________  Patient Information update  To: All of our patients.  Re: Name change.  It has been made official that our current name, "[facility]"   will soon be changed to "[facility]".   The purpose of this change is to eliminate any confusion created by the concept of our practice being a "Medication Management Pain [facility]". In the past this has led to the misconception that we treat pain primarily by the use of prescription medications.  Nothing can be farther from the truth.   Understanding PAIN MANAGEMENT: To further understand what our practice does, you first have to understand that "Pain Management" is a subspecialty that requires additional training once a physician has completed their specialty training, which can be in either Anesthesia, Neurology, Psychiatry, or Physical Medicine and Rehabilitation (PMR). Each one of these contributes to the final approach taken by each physician to the management of their patient's pain. To be a "Pain Management Specialist" you must have first completed one of the specialty trainings below.  Anesthesiologists - trained in clinical pharmacology and interventional techniques such as nerve blockade and regional as well as central neuroanatomy. They are trained to block pain before, during, and after surgical interventions.  Neurologists - trained in the diagnosis and pharmacological treatment of complex neurological conditions, such as Multiple Sclerosis, Parkinson's, spinal cord injuries, and other systemic conditions that may be associated with symptoms that may  include but are not limited to pain. They tend to rely  primarily on the treatment of chronic pain using prescription medications.  Psychiatrist - trained in conditions affecting the psychosocial wellbeing of patients including but not limited to depression, anxiety, schizophrenia, personality disorders, addiction, and other substance use disorders that may be associated with chronic pain. They tend to rely primarily on the treatment of chronic pain using prescription medications.   Physical Medicine and Rehabilitation (PMR) physicians, also known as physiatrists - trained to treat a wide variety of medical conditions affecting the brain, spinal cord, nerves, bones, joints, ligaments, muscles, and tendons. Their training is primarily aimed at treating patients that have suffered injuries that have caused severe physical impairment. Their training is primarily aimed at the physical therapy and rehabilitation of those patients. They may also work alongside orthopedic surgeons or neurosurgeons using their expertise in assisting surgical patients to recover after their surgeries.  INTERVENTIONAL PAIN MANAGEMENT is sub-subspecialty of Pain Management.  Our physicians are Board-certified in Anesthesia, Pain Management, and Interventional Pain Management.  This meaning that not only have they been trained and Board-certified in their specialty of Anesthesia, and subspecialty of Pain Management, but they have also received further training in the sub-subspecialty of Interventional Pain Management, in order to become Board-certified as INTERVENTIONAL PAIN MANAGEMENT SPECIALIST.    Mission: Our goal is to use our skills in  [place] as alternatives to the chronic use of prescription opioid medications for the treatment of pain. To make this more clear, we have changed our name to reflect what we do and offer. We will continue to offer medication management assessment and recommendations, but we  will not be taking over any patient's medication management.  ____________________________________________________________________________________________     ____________________________________________________________________________________________  New Patients  Welcome to [facility].   Initial Visit The first or initial visit consists of an evaluation only.   Interventional pain management.  We offer therapies other than opioid controlled substances to manage chronic pain. These include, but are not limited to, diagnostic, therapeutic, and palliative specialized injection therapies (i.e.: Epidural Steroids, Facet Blocks, etc.). We specialize in a variety of nerve blocks as well as radiofrequency treatments. We offer pain implant evaluations and trials, as well as follow up management. In addition we also provide a variety joint injections, including Viscosupplementation (AKA: Gel Therapy).  Prescription Pain Medication We provide evaluations for/of pharmacologic therapies. Recommendations will follow CDC Guidelines.  We no longer take patients for long-term medication management. We will not be taking over your pain medications.  ____________________________________________________________________________________________

## 2023-02-03 LAB — COMPLIANCE DRUG ANALYSIS, UR

## 2023-02-05 ENCOUNTER — Encounter: Payer: Self-pay | Admitting: Pain Medicine

## 2023-02-05 DIAGNOSIS — E560 Deficiency of vitamin E: Secondary | ICD-10-CM | POA: Insufficient documentation

## 2023-02-06 LAB — VITAMIN B12: Vitamin B-12: 724 pg/mL (ref 232–1245)

## 2023-02-06 LAB — C-REACTIVE PROTEIN: CRP: <1 mg/L (ref 0–10)

## 2023-02-06 LAB — SEDIMENTATION RATE: Sed Rate: 28 mm/hr (ref 0–40)

## 2023-02-06 LAB — 25-HYDROXY VITAMIN D LCMS D2+D3
25-Hydroxy, Vitamin D-2: 1.5 ng/mL
25-Hydroxy, Vitamin D-3: 36 ng/mL
25-Hydroxy, Vitamin D: 38 ng/mL

## 2023-02-06 LAB — MAGNESIUM: Magnesium: 1.8 mg/dL (ref 1.6–2.3)

## 2023-02-06 LAB — VITAMIN B1: Thiamine: 83.2 nmol/L (ref 66.5–200.0)

## 2023-02-06 LAB — VITAMIN E
Vitamin E (Alpha Tocopherol): 8.2 mg/L — ABNORMAL LOW (ref 9.0–29.0)
Vitamin E(Gamma Tocopherol): 1.3 mg/L (ref 0.5–4.9)

## 2023-02-06 NOTE — Progress Notes (Unsigned)
PROVIDER NOTE: Interpretation of information contained herein should be left to medically-trained personnel. Specific patient instructions are provided elsewhere under "Patient Instructions" section of medical record. This document was created in part using STT-dictation technology, any transcriptional errors that may result from this process are unintentional.  [name] Type: Established DOB: 06/27/1951 MRN: [id number] PCP: Glori Luis, MD  Service: Procedure DOS: 02/07/2023 Setting: Ambulatory Location: Ambulatory outpatient facility Delivery: Face-to-face Provider: Oswaldo Done, MD Specialty: Interventional Pain Management Specialty designation: 09 Location: Outpatient facility Ref. Prov.: Delano Metz, MD       Interventional Therapy   Procedure: Lumbar trans-foraminal epidural steroid injection (L-TFESI) #1  Laterality: Left (-LT)  Level: L5 & S1 nerve root(s) Imaging: Fluoroscopy-guided         Anesthesia: Local anesthesia (1-2% Lidocaine) Anxiolysis: IV Versed 2.0 mg Sedation: Moderate Sedation  No Fentanyl. Benadryl 12.5 mg + Toradol 15 mg.          DOS: 02/07/2023  Performed by: Oswaldo Done, MD  Purpose: Diagnostic/Therapeutic Indications: Lumbar radicular pain severe enough to impact quality of life or function. 1. Chronic lower extremity pain (1ry area of Pain) (Bilateral) (L>R)   2. Chronic lumbar radicular pain (L5/S1 dermatome) (Bilateral) (L>R)   3. DDD (degenerative disc disease), lumbar   4. Failed back surgical syndrome (x 3)   5. Epidural fibrosis   6. History of lumbar fusion (L3-4, L4-5, and L5-S1)   7. Lumbosacral foraminal stenosis (Left: L5-S1)   8. Numbness and tingling of feet (Bilateral) (L>R)   9. Abnormal MRI, lumbar spine (02/21/2020)   10. Abnormal CT scan, lumbar spine (08/03/2022)   11. History of allergy to radiographic contrast media    NAS-11 Pain score:   Pre-procedure: 8 /10   Post-procedure: 0-No  pain/10     Position / Prep / Materials:  Position: Prone  Prep solution: DuraPrep (Iodine Povacrylex [0.7% available iodine] and Isopropyl Alcohol, 74% w/w) Prep Area: Entire Posterior Lumbosacral Area.  From the lower tip of the scapula down to the tailbone and from flank to flank. Materials:  Tray: Block Needle(s):  Type: Spinal  Gauge (G): 22  Length: 5-in  Qty: 2      Pre-op H&P Assessment:  Maria Hamilton is a 72 y.o. (year old), female patient, seen today for interventional treatment. She  has a past surgical history that includes Breast surgery; Back surgery; Ectopic pregnancy surgery; Hip surgery; Abdominal hysterectomy; cyst removal from wrist; Cholecystectomy (N/A, 10/16/2016); Oophorectomy; Breast biopsy (Left, 90s); and Colonoscopy with propofol (N/A, 12/22/2021). Maria Hamilton has a current medication list which includes the following prescription(s): albuterol, amlodipine, calcium carbonate, carvedilol, cholecalciferol, duloxetine, gabapentin, omeprazole, potassium chloride sa, rosuvastatin, spironolactone, sucralfate, tizanidine, triamcinolone ointment, and desitin, and the following Facility-Administered Medications: lactated ringers and pentafluoroprop-tetrafluoroeth. Her primarily concern today is the Back Pain (Left, lower)  Initial Vital Signs:  Pulse/HCG Rate: 64ECG Heart Rate: 66 (NSR) Temp: (!) 97.3 F (36.3 C) Resp: 16 BP: (!) 160/88 SpO2: 100 %  BMI: Estimated body mass index is 26.52 kg/m as calculated from the following:   Height as of this encounter: 6\' 1"  (1.854 m).   Weight as of this encounter: 201 lb (91.2 kg).  Risk Assessment: Allergies: Reviewed. She is allergic to ace inhibitors, ciprofloxacin, fentanyl, hctz [hydrochlorothiazide], latex, morphine and related, penicillin g benzathine, isovue m [iopamidol], and tape.  Allergy Precautions: None required Coagulopathies: Reviewed. None identified.  Blood-thinner therapy: None at this time Active  Infection(s): Reviewed. None identified. [name] is afebrile  Site Confirmation: Maria Hamilton was asked to confirm the procedure and laterality before marking the site Procedure checklist: Completed Consent: Before the procedure and under the influence of no sedative(s), amnesic(s), or anxiolytics, the patient was informed of the treatment options, risks and possible complications. To fulfill our ethical and legal obligations, as recommended by the American Medical Association's Code of Ethics, I have informed the patient of my clinical impression; the nature and purpose of the treatment or procedure; the risks, benefits, and possible complications of the intervention; the alternatives, including doing nothing; the risk(s) and benefit(s) of the alternative treatment(s) or procedure(s); and the risk(s) and benefit(s) of doing nothing. The patient was provided information about the general risks and possible complications associated with the procedure. These may include, but are not limited to: failure to achieve desired goals, infection, bleeding, organ or nerve damage, allergic reactions, paralysis, and death. In addition, the patient was informed of those risks and complications associated to Spine-related procedures, such as failure to decrease pain; infection (i.e.: Meningitis, epidural or intraspinal abscess); bleeding (i.e.: epidural hematoma, subarachnoid hemorrhage, or any other type of intraspinal or peri-dural bleeding); organ or nerve damage (i.e.: Any type of peripheral nerve, nerve root, or spinal cord injury) with subsequent damage to sensory, motor, and/or autonomic systems, resulting in permanent pain, numbness, and/or weakness of one or several areas of the body; allergic reactions; (i.e.: anaphylactic reaction); and/or death. Furthermore, the patient was informed of those risks and complications associated with the medications. These include, but are not limited to: allergic reactions  (i.e.: anaphylactic or anaphylactoid reaction(s)); adrenal axis suppression; blood sugar elevation that in diabetics may result in ketoacidosis or comma; water retention that in patients with history of congestive heart failure may result in shortness of breath, pulmonary edema, and decompensation with resultant heart failure; weight gain; swelling or edema; medication-induced neural toxicity; particulate matter embolism and blood vessel occlusion with resultant organ, and/or nervous system infarction; and/or aseptic necrosis of one or more joints. Finally, the patient was informed that Medicine is not an exact science; therefore, there is also the possibility of unforeseen or unpredictable risks and/or possible complications that may result in a catastrophic outcome. The patient indicated having understood very clearly. We have given the patient no guarantees and we have made no promises. Enough time was given to the patient to ask questions, all of which were answered to the patient's satisfaction. Ms. Kastle has indicated that she wanted to continue with the procedure. Attestation: I, the ordering provider, attest that I have discussed with the patient the benefits, risks, side-effects, alternatives, likelihood of achieving goals, and potential problems during recovery for the procedure that I have provided informed consent. Date  Time: 02/07/2023  9:00 AM   Pre-Procedure Preparation:  Monitoring: As per clinic protocol. Respiration, ETCO2, SpO2, BP, heart rate and rhythm monitor placed and checked for adequate function Safety Precautions: Patient was assessed for positional comfort and pressure points before starting the procedure. Time-out: I initiated and conducted the "Time-out" before starting the procedure, as per protocol. The patient was asked to participate by confirming the accuracy of the "Time Out" information. Verification of the correct person, site, and procedure were performed and  confirmed by me, the nursing staff, and the patient. "Time-out" conducted as per Joint Commission's Universal Protocol (UP.01.01.01). Time: 0950  Description/Narrative of Procedure:          Target: The 6 o'clock position under the pedicle, on the affected side. Region: Posterolateral  Lumbosacral Approach: Posterior Percutaneous Paravertebral approach.  Rationale (medical necessity): procedure needed and proper for the diagnosis and/or treatment of the patient's medical symptoms and needs. Procedural Technique Safety Precautions: Aspiration looking for blood return was conducted prior to all injections. At no point did we inject any substances, as a needle was being advanced. No attempts were made at seeking any paresthesias. Safe injection practices and needle disposal techniques used. Medications properly checked for expiration dates. SDV (single dose vial) medications used. Description of the Procedure: Protocol guidelines were followed. The patient was placed in position over the procedure table. The target area was identified and the area prepped in the usual manner. Skin & deeper tissues infiltrated with local anesthetic. Appropriate amount of time allowed to pass for local anesthetics to take effect. The procedure needles were then advanced to the target area. Proper needle placement secured. Negative aspiration confirmed. Solution injected in intermittent fashion, asking for systemic symptoms every 0.5cc of injectate. The needles were then removed and the area cleansed, making sure to leave some of the prepping solution back to take advantage of its long term bactericidal properties.  Vitals:   02/07/23 0950 02/07/23 0955 02/07/23 1000 02/07/23 1012  BP: (!) 173/86 (!) 161/93 (!) 158/87 (!) 173/75  Pulse:      Resp: 18 14 15 15   Temp:      TempSrc:      SpO2: 100% 99% 100% 100%  Weight:      Height:        Start Time: 0950 hrs. End Time: 0959 hrs.  Imaging Guidance (Spinal):           Type of Imaging Technique: Fluoroscopy Guidance (Spinal) Indication(s): Assistance in needle guidance and placement for procedures requiring needle placement in or near specific anatomical locations not easily accessible without such assistance. Exposure Time: Please see nurses notes. Contrast: Before injecting any contrast, we confirmed that the patient did not have an allergy to iodine, shellfish, or radiological contrast. Once satisfactory needle placement was completed at the desired level, radiological contrast was injected. Contrast injected under live fluoroscopy. No contrast complications. See chart for type and volume of contrast used. Fluoroscopic Guidance: I was personally present during the use of fluoroscopy. "Tunnel Vision Technique" used to obtain the best possible view of the target area. Parallax error corrected before commencing the procedure. "Direction-depth-direction" technique used to introduce the needle under continuous pulsed fluoroscopy. Once target was reached, antero-posterior, oblique, and lateral fluoroscopic projection used confirm needle placement in all planes. Images permanently stored in EMR. Interpretation: I personally interpreted the imaging intraoperatively. Adequate needle placement confirmed in multiple planes. Appropriate spread of contrast into desired area was observed. No evidence of afferent or efferent intravascular uptake. No intrathecal or subarachnoid spread observed. Permanent images saved into the patient's record.  Post-operative Assessment:  Post-procedure Vital Signs:  Pulse/HCG Rate: 6465 (NSR) Temp: (!) 97.3 F (36.3 C) Resp: 15 BP: (!) 173/75 SpO2: 100 %  EBL: None  Complications: No immediate post-treatment complications observed by team, or reported by patient.  Note: The patient tolerated the entire procedure well. A repeat set of vitals were taken after the procedure and the patient was kept under observation following institutional  policy, for this type of procedure. Post-procedural neurological assessment was performed, showing return to baseline, prior to discharge. The patient was provided with post-procedure discharge instructions, including a section on how to identify potential problems. Should any problems arise concerning this procedure, the patient was given instructions to immediately  contact us, at any time, without hesitation. In any case, we plan to contact the patient by telephone for a follow-up status report regarding this interventional procedure.  Comments:  No additional relevant information.  Plan of Care (POC)  Orders:  Orders Placed This Encounter  Procedures   Lumbar Transforaminal Epidural    Scheduling Instructions:     Laterality: Left-sided     Level(s): L5 & S1     Sedation: With Sedation.     Timeframe: Today    Order Specific Question:   Where will this procedure be performed?    Answer:   [facility]   DG [facility] C-ARM 1-60 MIN NO REPORT    Intraoperative interpretation by procedural physician at [facility].    Standing Status:   Standing    Number of Occurrences:   1    Order Specific Question:   Reason for exam:    Answer:   Assistance in needle guidance and placement for procedures requiring needle placement in or near specific anatomical locations not easily accessible without such assistance.   Informed Consent Details: Physician/Practitioner Attestation; Transcribe to consent form and obtain patient signature    Provider Attestation: I, Shallen Luedke A. Laban Emperor, MD, (Pain Management Specialist), the physician/practitioner, attest that I have discussed with the patient the benefits, risks, side effects, alternatives, likelihood of achieving goals and potential problems during recovery for the procedure that I have provided informed consent.    Scheduling Instructions:     Note: Always confirm laterality of pain with Ms. Winkler, before procedure.     Transcribe  to consent form and obtain patient signature.    Order Specific Question:   Physician/Practitioner attestation of informed consent for procedure/surgical case    Answer:   I, the physician/practitioner, attest that I have discussed with the patient the benefits, risks, side effects, alternatives, likelihood of achieving goals and potential problems during recovery for the procedure that I have provided informed consent.    Order Specific Question:   Procedure    Answer:   Diagnostic lumbar transforaminal epidural steroid injection under fluoroscopic guidance. (See notes for level and laterality.)    Order Specific Question:   Physician/Practitioner performing the procedure    Answer:   Demetrio Leighty A. Laban Emperor, MD    Order Specific Question:   Indication/Reason    Answer:   Lumbar radiculopathy/radiculitis associated with lumbar stenosis   Provide equipment / supplies at bedside    Procedure tray: "Block Tray" (Disposable  single use) Skin infiltration needle: Regular 1.5-in, 25-G, (x1) Block Needle type: Spinal Amount/quantity: 2 Size: Medium (5-inch) Gauge: 22G    Standing Status:   Standing    Number of Occurrences:   1    Order Specific Question:   Specify    Answer:   Block Tray   Follow-up    Schedule Ms. Stacks for a post-procedure follow-up evaluation encounter 2 weeks from now.    Standing Status:   Future    Standing Expiration Date:   02/21/2023    Scheduling Instructions:     Schedule follow-up visit on afternoon of procedure day (T, Th)     Type: Face-to-face (F2F) Post-procedure (PP) evaluation (E/M)     When: 2 weeks from now   Miscellanous precautions    Standing Status:   Standing    Number of Occurrences:   1   Latex precautions    Activate Latex-Free Protocol.    Standing Status:   Standing  Number of Occurrences:   1   Chronic Opioid Analgesic:  None MME/day: 0 mg/day   Medications ordered for procedure: Meds ordered this encounter  Medications   lidocaine  (XYLOCAINE) 2 % (with pres) injection 400 mg   pentafluoroprop-tetrafluoroeth (GEBAUERS) aerosol   lactated ringers infusion   midazolam (VERSED) 5 MG/5ML injection 0.5-2 mg    Make sure Flumazenil is available in the pyxis when using this medication. If oversedation occurs, administer 0.2 mg IV over 15 sec. If after 45 sec no response, administer 0.2 mg again over 1 min; may repeat at 1 min intervals; not to exceed 4 doses (1 mg)   diphenhydrAMINE (BENADRYL) injection 12.5 mg   ketorolac (TORADOL) 30 MG/ML injection 15 mg   sodium chloride flush (NS) 0.9 % injection 2 mL    This is for a two (2) level block. Use two (2) syringes and divide content in half.   ropivacaine (PF) 2 mg/mL (0.2%) (NAROPIN) injection 2 mL    This is for a two (2) level block. Use two (2) syringes and divide content in half.   dexamethasone (DECADRON) injection 20 mg    This is for a two (2) level block. Use two (2) syringes and divide content in half.   Medications administered: We administered lidocaine, lactated ringers, midazolam, diphenhydrAMINE, ketorolac, sodium chloride flush, ropivacaine (PF) 2 mg/mL (0.2%), and dexamethasone.  See the medical record for exact dosing, route, and time of administration.  Follow-up plan:   Return in about 2 weeks (around 02/21/2023) for Proc-day (T,Th), (Face2F), (PPE).       Interventional Therapies  Risk Factors  Considerations:  ALLERGY: CONTRAST DYE  Fentanyl.  (Extremely poor historian) (poor intraprocedure compliance) (symptom exaggeration)  GERD  HTN  Anxiety  Memory impairment     Planned  Pending:   Diagnostic/therapeutic left L5 and S1 TFESI #1    Under consideration:   Diagnostic/therapeutic left L5 and S1 TFESI #1    Completed: (Extremely poor historian)  Therapeutic left caudal ESI x2 (02/04/2017)  Therapeutic left L2-3 LESI x2 (09/03/2017)  Therapeutic left L2 TFESI x2 (09/03/2017)  Therapeutic left RACZ procedure x1 (10/31/2017)  Diagnostic left  lumbar facet MBB x1 (05/08/2018) (did not keep follow-up) (100/100/100/100 as of 01/30/2023)    Completed by other providers:   Diagnostic EMG/PNCV (lower extremity) (02/18/2017) by Theora Master, MD [facility] neurolog[facility]) Dx: Severe, mixed, acquired polyneuropathy of the lower extremities.   Therapeutic  Palliative (PRN) options:   None established      Recent Visits Date Type Provider Dept  01/30/23 Office Visit Delano Metz, MD [facility]  Showing recent visits within past 90 days and meeting all other requirements Today's Visits Date Type Provider Dept  02/07/23 Procedure visit Delano Metz, MD [facility]  Showing today's visits and meeting all other requirements Future Appointments Date Type Provider Dept  02/21/23 Appointment Delano Metz, MD [facility]  Showing future appointments within next 90 days and meeting all other requirements  Disposition: Discharge home  Discharge (Date  Time): 02/07/2023; 1024 hrs.   Primary Care Physician: Glori Luis, MD Location: [facility] Note by: Oswaldo Done, MD (TTS technology used. I apologize for any typographical errors that were not detected and corrected.) Date: 02/07/2023; Time: 10:49 AM  Disclaimer:  Medicine is not an Visual merchandiser. The only guarantee in medicine is that nothing is guaranteed. It is important to note that the decision to proceed with this intervention was based  on the information collected from the patient. The Data and conclusions were drawn from the patient's questionnaire, the interview, and the physical examination. Because the information was provided in large part by the patient, it cannot be guaranteed that it has not been purposely or unconsciously manipulated. Every effort has been made to obtain as much relevant data as possible for this evaluation. It is important to note that the conclusions that lead to this procedure are  derived in large part from the available data. Always take into account that the treatment will also be dependent on availability of resources and existing treatment guidelines, considered by other Pain Management Practitioners as being common knowledge and practice, at the time of the intervention. For Medico-Legal purposes, it is also important to point out that variation in procedural techniques and pharmacological choices are the acceptable norm. The indications, contraindications, technique, and results of the above procedure should only be interpreted and judged by a Board-Certified Interventional Pain Specialist with extensive familiarity and expertise in the same exact procedure and technique.

## 2023-02-07 ENCOUNTER — Ambulatory Visit
Admission: RE | Admit: 2023-02-07 | Discharge: 2023-02-07 | Disposition: A | Discharge status: Home / Self Care | Payer: Medicare HMO | Source: Ambulatory Visit | Attending: Pain Medicine | Admitting: Pain Medicine

## 2023-02-07 ENCOUNTER — Ambulatory Visit: Payer: Medicare HMO | Attending: Pain Medicine | Admitting: Pain Medicine

## 2023-02-07 ENCOUNTER — Encounter: Payer: Self-pay | Admitting: Pain Medicine

## 2023-02-07 VITALS — BP 173/75 | HR 64 | Temp 97.3°F | Resp 15 | Ht 73.0 in | Wt 201.0 lb

## 2023-02-07 DIAGNOSIS — R937 Abnormal findings on diagnostic imaging of other parts of musculoskeletal system: Secondary | ICD-10-CM | POA: Insufficient documentation

## 2023-02-07 DIAGNOSIS — G96198 Other disorders of meninges, not elsewhere classified: Secondary | ICD-10-CM | POA: Diagnosis not present

## 2023-02-07 DIAGNOSIS — Z91040 Latex allergy status: Secondary | ICD-10-CM | POA: Insufficient documentation

## 2023-02-07 DIAGNOSIS — Z91041 Radiographic dye allergy status: Secondary | ICD-10-CM | POA: Diagnosis present

## 2023-02-07 DIAGNOSIS — Z981 Arthrodesis status: Secondary | ICD-10-CM | POA: Insufficient documentation

## 2023-02-07 DIAGNOSIS — M79604 Pain in right leg: Secondary | ICD-10-CM | POA: Diagnosis not present

## 2023-02-07 DIAGNOSIS — M4807 Spinal stenosis, lumbosacral region: Secondary | ICD-10-CM | POA: Diagnosis not present

## 2023-02-07 DIAGNOSIS — M961 Postlaminectomy syndrome, not elsewhere classified: Secondary | ICD-10-CM | POA: Diagnosis not present

## 2023-02-07 DIAGNOSIS — G8929 Other chronic pain: Secondary | ICD-10-CM | POA: Insufficient documentation

## 2023-02-07 DIAGNOSIS — M5416 Radiculopathy, lumbar region: Secondary | ICD-10-CM | POA: Insufficient documentation

## 2023-02-07 DIAGNOSIS — R202 Paresthesia of skin: Secondary | ICD-10-CM | POA: Diagnosis present

## 2023-02-07 DIAGNOSIS — M5116 Intervertebral disc disorders with radiculopathy, lumbar region: Secondary | ICD-10-CM

## 2023-02-07 DIAGNOSIS — R200 Anesthesia of skin: Secondary | ICD-10-CM | POA: Diagnosis present

## 2023-02-07 DIAGNOSIS — M5136 Other intervertebral disc degeneration, lumbar region: Secondary | ICD-10-CM | POA: Diagnosis not present

## 2023-02-07 DIAGNOSIS — M79605 Pain in left leg: Secondary | ICD-10-CM | POA: Diagnosis not present

## 2023-02-07 MED ORDER — SODIUM CHLORIDE (PF) 0.9 % IJ SOLN
INTRAMUSCULAR | Status: AC
  Filled 2023-02-07: qty 10

## 2023-02-07 MED ORDER — MIDAZOLAM HCL 5 MG/5ML IJ SOLN
0.5000 mg | Freq: Once | INTRAMUSCULAR | Status: AC
  Administered 2023-02-07: 2 mg via INTRAVENOUS

## 2023-02-07 MED ORDER — DEXAMETHASONE SODIUM PHOSPHATE 10 MG/ML IJ SOLN
INTRAMUSCULAR | Status: AC
  Filled 2023-02-07: qty 2

## 2023-02-07 MED ORDER — ROPIVACAINE HCL 2 MG/ML IJ SOLN
INTRAMUSCULAR | Status: AC
  Filled 2023-02-07: qty 20

## 2023-02-07 MED ORDER — LIDOCAINE HCL 2 % IJ SOLN
20.0000 mL | Freq: Once | INTRAMUSCULAR | Status: AC
  Administered 2023-02-07: 400 mg

## 2023-02-07 MED ORDER — DEXAMETHASONE SODIUM PHOSPHATE 10 MG/ML IJ SOLN
20.0000 mg | Freq: Once | INTRAMUSCULAR | Status: AC
  Administered 2023-02-07: 20 mg

## 2023-02-07 MED ORDER — LIDOCAINE HCL 2 % IJ SOLN
INTRAMUSCULAR | Status: AC
  Filled 2023-02-07: qty 20

## 2023-02-07 MED ORDER — SODIUM CHLORIDE 0.9% FLUSH
2.0000 mL | Freq: Once | INTRAVENOUS | Status: AC
  Administered 2023-02-07: 2 mL

## 2023-02-07 MED ORDER — LACTATED RINGERS IV SOLN: Freq: Once | INTRAVENOUS | Status: AC

## 2023-02-07 MED ORDER — PENTAFLUOROPROP-TETRAFLUOROETH EX AERO
INHALATION_SPRAY | Freq: Once | CUTANEOUS | Status: DC
  Filled 2023-02-07: qty 116

## 2023-02-07 MED ORDER — DIPHENHYDRAMINE HCL 50 MG/ML IJ SOLN
12.5000 mg | Freq: Once | INTRAMUSCULAR | Status: AC
  Administered 2023-02-07: 12.5 mg via INTRAVENOUS
  Filled 2023-02-07: qty 1

## 2023-02-07 MED ORDER — KETOROLAC TROMETHAMINE 30 MG/ML IJ SOLN
15.0000 mg | Freq: Once | INTRAMUSCULAR | Status: AC
  Administered 2023-02-07: 15 mg via INTRAVENOUS
  Filled 2023-02-07: qty 1

## 2023-02-07 MED ORDER — ROPIVACAINE HCL 2 MG/ML IJ SOLN
2.0000 mL | Freq: Once | INTRAMUSCULAR | Status: AC
  Administered 2023-02-07: 2 mL via EPIDURAL

## 2023-02-07 MED ORDER — MIDAZOLAM HCL 5 MG/5ML IJ SOLN
INTRAMUSCULAR | Status: AC
  Filled 2023-02-07: qty 5

## 2023-02-07 NOTE — Patient Instructions (Signed)

## 2023-02-08 ENCOUNTER — Telehealth: Payer: Self-pay

## 2023-02-08 NOTE — Telephone Encounter (Signed)
Post procedure follow up.  Patient states she is doing ok.  States she is moving around better.

## 2023-02-15 ENCOUNTER — Other Ambulatory Visit: Payer: Self-pay | Admitting: Family

## 2023-02-15 ENCOUNTER — Telehealth: Payer: Self-pay | Admitting: Family Medicine

## 2023-02-15 NOTE — Telephone Encounter (Signed)
Contacted Maria Hamilton to schedule their annual wellness visit. Appointment made for 02/22/2023.  Thank you,  [place] Direct dial  [phone]

## 2023-02-20 NOTE — Progress Notes (Signed)
PROVIDER NOTE: Information contained herein reflects review and annotations entered in association with encounter. Interpretation of such information and data should be left to medically-trained personnel. Information provided to patient can be located elsewhere in the medical record under "Patient Instructions". Document created using STT-dictation technology, any transcriptional errors that may result from process are unintentional.    [name]  Service Category: E/M  Provider: Oswaldo Done, MD  DOB: 26-May-1951  DOS: 02/21/2023  Referring Provider: Glori Luis, MD  MRN: [id number]  Specialty: Interventional Pain Management  PCP: Glori Luis, MD  Type: Established Patient  Setting: Ambulatory outpatient    Location: Office  Delivery: Face-to-face     HPI  [name], a 72 y.o. year old female, is here today because of her Chronic pain of lower extremity, bilateral [M79.604, M79.605, G89.29]. Maria Hamilton primary complain today is No chief complaint on file.  Pertinent problems: Maria Hamilton has Rheumatoid factor positive; Fibromyalgia; Chronic low back pain (1ry area of Pain) (Left); Chronic pain syndrome; 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); Rheumatoid arthritis, multiple sites (positive RF); Chronic lower extremity pain (1ry area of Pain) (Bilateral) (L>R); Lumbar facet arthropathy (Bilateral); Failed back surgical syndrome (x 3); Epidural fibrosis; Neurogenic pain; Musculoskeletal pain; Chronic lumbar radicular pain (L5/S1 dermatome) (Bilateral) (L>R); History of total hip replacement, bilateral; Polyneuropathy; Post herpetic neuralgia; Abnormal MRI, lumbar spine (02/21/2020); 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); History of lumbar fusion (L3-4, L4-5, and L5-S1); Chronic foot numbness (Left); Ankle pain; DDD (degenerative disc disease), lumbar; Osteoarthritis of hip; Pain of both shoulder joints; Spondylosis  without myelopathy or radiculopathy, lumbosacral region; Chronic hip pain after total replacement of hip joint (Left); Chronic hip pain after total replacement of hip joint (Right); Chronic pain of hip  (Bilateral) (L>R); Chronic sacroiliac joint pain (Bilateral) (L>R); Lumbar facet syndrome (Bilateral) (L>R); Numbness and tingling of feet (Bilateral) (L>R); Toe swelling; Leg edema; Leg swelling; Left foot pain; Left shoulder pain; Muscle cramps; Generalized weakness; Unsteady gait; Left knee pain; Arthritis of knee; Bursitis of hip; Contusion of knee; Abnormal NCS (nerve conduction studies) (02/18/2017); Abnormal CT scan, lumbar spine (08/03/2022); Grade 1 Retrolisthesis of L1/L2 & L2/L3; and Lumbosacral foraminal stenosis (Left: L5-S1) on their pertinent problem list. Pain Assessment: Severity of   is reported as a  /10. Location:    / . Onset:  . Quality:  . Timing:  . Modifying factor(s):  Marland Kitchen Vitals:  vitals were not taken for this visit.  BMI: Estimated body mass index is 26.52 kg/m as calculated from the following:   Height as of 02/07/23: 6\' 1"  (1.854 m).   Weight as of 02/07/23: 201 lb (91.2 kg). Last encounter: 01/30/2023. Last procedure: 02/07/2023.  Reason for encounter: post-procedure evaluation and assessment. ***  Post-procedure evaluation   Procedure: Lumbar trans-foraminal epidural steroid injection (L-TFESI) #1  Laterality: Left (-LT)  Level: L5 & S1 nerve root(s) Imaging: Fluoroscopy-guided         Anesthesia: Local anesthesia (1-2% Lidocaine) Anxiolysis: IV Versed 2.0 mg Sedation: Moderate Sedation  No Fentanyl. Benadryl 12.5 mg + Toradol 15 mg.          DOS: 02/07/2023  Performed by: Oswaldo Done, MD  Purpose: Diagnostic/Therapeutic Indications: Lumbar radicular pain severe enough to impact quality of life or function. 1. Chronic lower extremity pain (1ry area of Pain) (Bilateral) (L>R)   2. Chronic lumbar radicular pain (L5/S1 dermatome) (Bilateral) (L>R)   3. DDD  (degenerative disc disease),  lumbar   4. Failed back surgical syndrome (x 3)   5. Epidural fibrosis   6. History of lumbar fusion (L3-4, L4-5, and L5-S1)   7. Lumbosacral foraminal stenosis (Left: L5-S1)   8. Numbness and tingling of feet (Bilateral) (L>R)   9. Abnormal MRI, lumbar spine (02/21/2020)   10. Abnormal CT scan, lumbar spine (08/03/2022)   11. History of allergy to radiographic contrast media    NAS-11 Pain score:   Pre-procedure: 8 /10   Post-procedure: 0-No pain/10      Effectiveness:  Initial hour after procedure:   ***. Subsequent 4-6 hours post-procedure:   ***. Analgesia past initial 6 hours:   ***. Ongoing improvement:  Analgesic:  *** Function:    ***    ROM:    ***     Pharmacotherapy Assessment  Analgesic: None MME/day: 0 mg/day   Monitoring: [place] PMP: PDMP reviewed during this encounter.       Pharmacotherapy: No side-effects or adverse reactions reported. Compliance: No problems identified. Effectiveness: Clinically acceptable.  No notes on file  No results found for: "CBDTHCR" No results found for: "D8THCCBX" No results found for: "D9THCCBX"  UDS:  Summary  Date Value Ref Range Status  01/30/2023 Note  Final    Comment:    ==================================================================== Compliance Drug Analysis, Ur ==================================================================== Test                             Result       Flag       Units  Drug Present and Declared for Prescription Verification   Gabapentin                     PRESENT      EXPECTED   Tizanidine                     PRESENT      EXPECTED   Duloxetine                     PRESENT      EXPECTED  Drug Present not Declared for Prescription Verification   Citalopram                     PRESENT      UNEXPECTED   Desmethylcitalopram            PRESENT      UNEXPECTED    Desmethylcitalopram is an expected metabolite of citalopram or the    enantiomeric form,  escitalopram.  ==================================================================== Test                      Result    Flag   Units      Ref Range   Creatinine              79               mg/dL      >=96 ==================================================================== Declared Medications:  The flagging and interpretation on this report are based on the  following declared medications.  Unexpected results may arise from  inaccuracies in the declared medications.   **Note: The testing scope of this panel includes these medications:   Duloxetine (Cymbalta)  Gabapentin (Neurontin)   **Note: The testing scope of this panel does not include small to  moderate amounts of these reported medications:   Tizanidine   **Note:  The testing scope of this panel does not include the  following reported medications:   Albuterol  Amlodipine  Calcium  Carvedilol (Coreg)  Cholecalciferol  Omeprazole  Potassium (Klor-Con)  Rosuvastatin  Spironolactone  Sucralfate (Carafate)  Triamcinolone (Kenalog)  Valacyclovir (Valtrex)  Zinc Oxide ==================================================================== For clinical consultation, please call [phone]. ====================================================================       ROS  Constitutional: Denies any fever or chills Gastrointestinal: No reported hemesis, hematochezia, vomiting, or acute GI distress Musculoskeletal: Denies any acute onset joint swelling, redness, loss of ROM, or weakness Neurological: No reported episodes of acute onset apraxia, aphasia, dysarthria, agnosia, amnesia, paralysis, loss of coordination, or loss of consciousness  Medication Review  DULoxetine, Zinc Oxide, albuterol, amLODipine, calcium carbonate, carvedilol, cholecalciferol, gabapentin, omeprazole, potassium chloride SA, rosuvastatin, spironolactone, sucralfate, tiZANidine, and triamcinolone ointment  History Review  Allergy: Maria Hamilton  is allergic to ace inhibitors, ciprofloxacin, fentanyl, hctz [hydrochlorothiazide], latex, morphine and related, penicillin g benzathine, isovue m [iopamidol], and tape. Drug: Maria Hamilton  reports no history of drug use. Alcohol:  reports no history of alcohol use. Tobacco:  reports that she has never smoked. She has never used smokeless tobacco. Social: Maria Hamilton  reports that she has never smoked. She has never used smokeless tobacco. She reports that she does not drink alcohol and does not use drugs. Medical:  has a past medical history of Acute anxiety (08/03/2015), Acute bronchitis (12/31/2016), Acute pancreatitis (10/14/2016), Altered mental status (10/12/2016), Anxiety, Bronchitis, Chronic hip pain, Chronic pain syndrome, Depression, Edema, Fatigue, Fibromyalgia, GERD (gastroesophageal reflux disease), Headache, Hypertension, Insomnia, Low back pain, Right upper quadrant abdominal pain, and Vitamin D deficiency. Surgical: Maria Hamilton  has a past surgical history that includes Breast surgery; Back surgery; Ectopic pregnancy surgery; Hip surgery; Abdominal hysterectomy; cyst removal from wrist; Cholecystectomy (N/A, 10/16/2016); Oophorectomy; Breast biopsy (Left, 90s); and Colonoscopy with propofol (N/A, 12/22/2021). Family: family history includes Alcohol abuse in her father; Cancer in her father; Heart disease in her mother.  Laboratory Chemistry Profile   Renal Lab Results  Component Value Date   BUN 8 12/19/2022   CREATININE 0.84 12/19/2022   BCR 14 02/05/2020   GFR 69.78 12/19/2022   GFRAA >60 07/10/2018   GFRNONAA >60 04/05/2021    Hepatic Lab Results  Component Value Date   AST 15 12/19/2022   ALT 8 12/19/2022   ALBUMIN 4.1 12/19/2022   ALKPHOS 91 12/19/2022   LIPASE 25 04/05/2021    Electrolytes Lab Results  Component Value Date   NA 139 12/19/2022   K 4.1 12/19/2022   CL 103 12/19/2022   CALCIUM 9.3 12/19/2022   MG 1.8 01/30/2023   PHOS 4.4 12/14/2020    Bone Lab  Results  Component Value Date   25OHVITD1 38 01/30/2023   25OHVITD2 1.5 01/30/2023   25OHVITD3 36 01/30/2023    Inflammation (CRP: Acute Phase) (ESR: Chronic Phase) Lab Results  Component Value Date   CRP <1 01/30/2023   ESRSEDRATE 28 01/30/2023   LATICACIDVEN 1.0 10/24/2016         Note: Above Lab results reviewed.  Recent Imaging Review  DG PAIN CLINIC C-ARM 1-60 MIN NO REPORT Fluoro was used, but no Radiologist interpretation will be provided.  Please refer to "NOTES" tab for provider progress note. Note: Reviewed        Physical Exam  General appearance: Well nourished, well developed, and well hydrated. In no apparent acute distress Mental status: Alert, oriented x 3 (person, place, & time)  Respiratory: No evidence of acute respiratory distress Eyes: PERLA Vitals: There were no vitals taken for this visit. BMI: Estimated body mass index is 26.52 kg/m as calculated from the following:   Height as of 02/07/23: 6\' 1"  (1.854 m).   Weight as of 02/07/23: 201 lb (91.2 kg). Ideal: Ideal body weight: 75.4 kg (166 lb 3.6 oz) Adjusted ideal body weight: 81.7 kg (180 lb 2.2 oz)  Assessment   Diagnosis Status  1. Chronic lower extremity pain (1ry area of Pain) (Bilateral) (L>R)   2. Chronic lumbar radicular pain (L5/S1 dermatome) (Bilateral) (L>R)   3. Failed back surgical syndrome (x 3)    Controlled Controlled Controlled   Updated Problems: No problems updated.  Plan of Care  Problem-specific:  No problem-specific Assessment & Plan notes found for this encounter.  Maria Hamilton has a current medication list which includes the following long-term medication(s): albuterol, amlodipine, calcium carbonate, carvedilol, duloxetine, gabapentin, omeprazole, potassium chloride sa, rosuvastatin, spironolactone, and sucralfate.  Pharmacotherapy (Medications Ordered): No orders of the defined types were placed in this encounter.  Orders:  No orders of the defined  types were placed in this encounter.  Follow-up plan:   No follow-ups on file.      Interventional Therapies  Risk Factors  Considerations:  ALLERGY: CONTRAST DYE  Fentanyl.  (Extremely poor historian) (poor intraprocedure compliance) (symptom exaggeration)  GERD  HTN  Anxiety  Memory impairment     Planned  Pending:   Diagnostic/therapeutic left L5 and S1 TFESI #1    Under consideration:   Diagnostic/therapeutic left L5 and S1 TFESI #1    Completed: (Extremely poor historian)  Therapeutic left caudal ESI x2 (02/04/2017)  Therapeutic left L2-3 LESI x2 (09/03/2017)  Therapeutic left L2 TFESI x2 (09/03/2017)  Therapeutic left RACZ procedure x1 (10/31/2017)  Diagnostic left lumbar facet MBB x1 (05/08/2018) (did not keep follow-up) (100/100/100/100 as of 01/30/2023)    Completed by other providers:   Diagnostic EMG/PNCV (lower extremity) (02/18/2017) by Theora Master, MD [facility] neurology) Dx: Severe, mixed, acquired polyneuropathy of the lower extremities.   Therapeutic  Palliative (PRN) options:   None established       Recent Visits Date Type Provider Dept  02/07/23 Procedure visit Delano Metz, MD [facility]  01/30/23 Office Visit Delano Metz, MD [facility]  Showing recent visits within past 90 days and meeting all other requirements Future Appointments Date Type Provider Dept  02/21/23 Appointment Delano Metz, MD [facility]  Showing future appointments within next 90 days and meeting all other requirements  I discussed the assessment and treatment plan with the patient. The patient was provided an opportunity to ask questions and all were answered. The patient agreed with the plan and demonstrated an understanding of the instructions.  Patient advised to call back or seek an in-person evaluation if the symptoms or condition worsens.  Duration of encounter: *** minutes.  Total time on encounter, as per AMA guidelines  included both the face-to-face and non-face-to-face time personally spent by the physician and/or other qualified health care professional(s) on the day of the encounter (includes time in activities that require the physician or other qualified health care professional and does not include time in activities normally performed by clinical staff). Physician's time may include the following activities when performed: Preparing to see the patient (e.g., pre-charting review of records, searching for previously ordered imaging, lab work, and nerve conduction tests) Review of prior analgesic pharmacotherapies. Reviewing PMP Interpreting ordered tests (e.g.,  lab work, imaging, nerve conduction tests) Performing post-procedure evaluations, including interpretation of diagnostic procedures Obtaining and/or reviewing separately obtained history Performing a medically appropriate examination and/or evaluation Counseling and educating the patient/family/caregiver Ordering medications, tests, or procedures Referring and communicating with other health care professionals (when not separately reported) Documenting clinical information in the electronic or other health record Independently interpreting results (not separately reported) and communicating results to the patient/ family/caregiver Care coordination (not separately reported)  Note by: Oswaldo Done, MD Date: 02/21/2023; Time: 7:28 AM

## 2023-02-21 ENCOUNTER — Ambulatory Visit: Payer: Medicare HMO | Attending: Pain Medicine | Admitting: Pain Medicine

## 2023-02-21 ENCOUNTER — Encounter: Payer: Self-pay | Admitting: Pain Medicine

## 2023-02-21 VITALS — BP 157/92 | HR 80 | Temp 97.3°F | Resp 16 | Ht 73.0 in | Wt 201.0 lb

## 2023-02-21 DIAGNOSIS — M961 Postlaminectomy syndrome, not elsewhere classified: Secondary | ICD-10-CM | POA: Diagnosis not present

## 2023-02-21 DIAGNOSIS — M5416 Radiculopathy, lumbar region: Secondary | ICD-10-CM | POA: Insufficient documentation

## 2023-02-21 DIAGNOSIS — M79605 Pain in left leg: Secondary | ICD-10-CM | POA: Insufficient documentation

## 2023-02-21 DIAGNOSIS — G96198 Other disorders of meninges, not elsewhere classified: Secondary | ICD-10-CM | POA: Diagnosis not present

## 2023-02-21 DIAGNOSIS — G8929 Other chronic pain: Secondary | ICD-10-CM | POA: Insufficient documentation

## 2023-02-21 DIAGNOSIS — M79604 Pain in right leg: Secondary | ICD-10-CM | POA: Diagnosis not present

## 2023-02-21 DIAGNOSIS — M545 Low back pain, unspecified: Secondary | ICD-10-CM | POA: Insufficient documentation

## 2023-02-21 DIAGNOSIS — M5417 Radiculopathy, lumbosacral region: Secondary | ICD-10-CM | POA: Insufficient documentation

## 2023-02-21 DIAGNOSIS — M5136 Other intervertebral disc degeneration, lumbar region: Secondary | ICD-10-CM | POA: Insufficient documentation

## 2023-02-21 MED ORDER — KETOROLAC TROMETHAMINE 60 MG/2ML IM SOLN
60.0000 mg | Freq: Once | INTRAMUSCULAR | Status: AC
  Administered 2023-02-21: 60 mg via INTRAMUSCULAR

## 2023-02-21 MED ORDER — METHOCARBAMOL 1000 MG/10ML IJ SOLN
200.0000 mg | Freq: Once | INTRAMUSCULAR | Status: AC
  Administered 2023-02-21: 200 mg via INTRAMUSCULAR

## 2023-02-21 MED ORDER — KETOROLAC TROMETHAMINE 60 MG/2ML IM SOLN
INTRAMUSCULAR | Status: AC
  Filled 2023-02-21: qty 2

## 2023-02-21 MED ORDER — METHOCARBAMOL 1000 MG/10ML IJ SOLN
INTRAMUSCULAR | Status: AC
  Filled 2023-02-21: qty 10

## 2023-02-21 NOTE — Patient Instructions (Signed)

## 2023-02-21 NOTE — Progress Notes (Signed)
Safety precautions to be maintained throughout the outpatient stay will include: orient to surroundings, keep bed in low position, maintain call bell within reach at all times, provide assistance with transfer out of bed and ambulation.  

## 2023-02-22 ENCOUNTER — Ambulatory Visit (INDEPENDENT_AMBULATORY_CARE_PROVIDER_SITE_OTHER): Payer: Medicare HMO

## 2023-02-22 VITALS — Ht 73.0 in | Wt 201.0 lb

## 2023-02-22 DIAGNOSIS — Z0000 Encounter for general adult medical examination without abnormal findings: Secondary | ICD-10-CM

## 2023-02-22 DIAGNOSIS — Z1231 Encounter for screening mammogram for malignant neoplasm of breast: Secondary | ICD-10-CM

## 2023-02-22 NOTE — Patient Instructions (Addendum)
[name] , Thank you for taking time to come for your Medicare Wellness Visit. I appreciate your ongoing commitment to your health goals. Please review the following plan we discussed and let me know if I can assist you in the future.   These are the goals we discussed:  Goals      Prevent falls     Do not stand on chairs when reaching for objects.  Pace self when walking.          This is a list of the screening recommended for you and due dates:  Health Maintenance  Topic Date Due   DTaP/Tdap/Td vaccine (3 - Td or Tdap) 06/10/2022   COVID-19 Vaccine (1) 03/10/2023*   Flu Shot  03/17/2023*   Zoster (Shingles) Vaccine (1 of 2) 04/16/2023*   Pneumonia Vaccine (2 of 2 - PCV) 02/22/2024*   Medicare Annual Wellness Visit  02/22/2024   Mammogram  09/07/2024   Colon Cancer Screening  12/23/2031   DEXA scan (bone density measurement)  Completed   Hepatitis C Screening: USPSTF Recommendation to screen - Ages 35-79 yo.  Completed   HPV Vaccine  Aged Out  *Topic was postponed. The date shown is not the original due date.    Advanced directives: declined.   Conditions/risks identified: none new.   Next appointment: Follow up in one year for your annual wellness visit    Preventive Care 65 Years and Older, Female Preventive care refers to lifestyle choices and visits with your health care provider that can promote health and wellness. What does preventive care include? A yearly physical exam. This is also called an annual well check. Dental exams once or twice a year. Routine eye exams. Ask your health care provider how often you should have your eyes checked. Personal lifestyle choices, including: Daily care of your teeth and gums. Regular physical activity. Eating a healthy diet. Avoiding tobacco and drug use. Limiting alcohol use. Practicing safe sex. Taking low-dose aspirin every day. Taking vitamin and mineral supplements as recommended by your health care  provider. What happens during an annual well check? The services and screenings done by your health care provider during your annual well check will depend on your age, overall health, lifestyle risk factors, and family history of disease. Counseling  Your health care provider may ask you questions about your: Alcohol use. Tobacco use. Drug use. Emotional well-being. Home and relationship well-being. Sexual activity. Eating habits. History of falls. Memory and ability to understand (cognition). Work and work Statistician. Reproductive health. Screening  You may have the following tests or measurements: Height, weight, and BMI. Blood pressure. Lipid and cholesterol levels. These may be checked every 5 years, or more frequently if you are over 78 years old. Skin check. Lung cancer screening. You may have this screening every year starting at age 72 if you have a 30-pack-year history of smoking and currently smoke or have quit within the past 15 years. Fecal occult blood test (FOBT) of the stool. You may have this test every year starting at age 23. Flexible sigmoidoscopy or colonoscopy. You may have a sigmoidoscopy every 5 years or a colonoscopy every 10 years starting at age 36. Hepatitis C blood test. Hepatitis B blood test. Sexually transmitted disease (STD) testing. Diabetes screening. This is done by checking your blood sugar (glucose) after you have not eaten for a while (fasting). You may have this done every 1-3 years. Bone density scan. This is done to screen for osteoporosis. You may  have this done starting at age 36. Mammogram. This may be done every 1-2 years. Talk to your health care provider about how often you should have regular mammograms. Talk with your health care provider about your test results, treatment options, and if necessary, the need for more tests. Vaccines  Your health care provider may recommend certain vaccines, such as: Influenza vaccine. This is  recommended every year. Tetanus, diphtheria, and acellular pertussis (Tdap, Td) vaccine. You may need a Td booster every 10 years. Zoster vaccine. You may need this after age 51. Pneumococcal 13-valent conjugate (PCV13) vaccine. One dose is recommended after age 25. Pneumococcal polysaccharide (PPSV23) vaccine. One dose is recommended after age 50. Talk to your health care provider about which screenings and vaccines you need and how often you need them. This information is not intended to replace advice given to you by your health care provider. Make sure you discuss any questions you have with your health care provider. Document Released: 12/30/2015 Document Revised: 08/22/2016 Document Reviewed: 10/04/2015 Elsevier Interactive Patient Education  2017 [place] Prevention in the Home Falls can cause injuries. They can happen to people of all ages. There are many things you can do to make your home safe and to help prevent falls. What can I do on the outside of my home? Regularly fix the edges of walkways and driveways and fix any cracks. Remove anything that might make you trip as you walk through a door, such as a raised step or threshold. Trim any bushes or trees on the path to your home. Use bright outdoor lighting. Clear any walking paths of anything that might make someone trip, such as rocks or tools. Regularly check to see if handrails are loose or broken. Make sure that both sides of any steps have handrails. Any raised decks and porches should have guardrails on the edges. Have any leaves, snow, or ice cleared regularly. Use sand or salt on walking paths during winter. Clean up any spills in your garage right away. This includes oil or grease spills. What can I do in the bathroom? Use night lights. Install grab bars by the toilet and in the tub and shower. Do not use towel bars as grab bars. Use non-skid mats or decals in the tub or shower. If you need to sit down in  the shower, use a plastic, non-slip stool. Keep the floor dry. Clean up any water that spills on the floor as soon as it happens. Remove soap buildup in the tub or shower regularly. Attach bath mats securely with double-sided non-slip rug tape. Do not have throw rugs and other things on the floor that can make you trip. What can I do in the bedroom? Use night lights. Make sure that you have a light by your bed that is easy to reach. Do not use any sheets or blankets that are too big for your bed. They should not hang down onto the floor. Have a firm chair that has side arms. You can use this for support while you get dressed. Do not have throw rugs and other things on the floor that can make you trip. What can I do in the kitchen? Clean up any spills right away. Avoid walking on wet floors. Keep items that you use a lot in easy-to-reach places. If you need to reach something above you, use a strong step stool that has a grab bar. Keep electrical cords out of the way. Do not use floor polish  or wax that makes floors slippery. If you must use wax, use non-skid floor wax. Do not have throw rugs and other things on the floor that can make you trip. What can I do with my stairs? Do not leave any items on the stairs. Make sure that there are handrails on both sides of the stairs and use them. Fix handrails that are broken or loose. Make sure that handrails are as long as the stairways. Check any carpeting to make sure that it is firmly attached to the stairs. Fix any carpet that is loose or worn. Avoid having throw rugs at the top or bottom of the stairs. If you do have throw rugs, attach them to the floor with carpet tape. Make sure that you have a light switch at the top of the stairs and the bottom of the stairs. If you do not have them, ask someone to add them for you. What else can I do to help prevent falls? Wear shoes that: Do not have high heels. Have rubber bottoms. Are comfortable  and fit you well. Are closed at the toe. Do not wear sandals. If you use a stepladder: Make sure that it is fully opened. Do not climb a closed stepladder. Make sure that both sides of the stepladder are locked into place. Ask someone to hold it for you, if possible. Clearly mark and make sure that you can see: Any grab bars or handrails. First and last steps. Where the edge of each step is. Use tools that help you move around (mobility aids) if they are needed. These include: Canes. Walkers. Scooters. Crutches. Turn on the lights when you go into a dark area. Replace any light bulbs as soon as they burn out. Set up your furniture so you have a clear path. Avoid moving your furniture around. If any of your floors are uneven, fix them. If there are any pets around you, be aware of where they are. Review your medicines with your doctor. Some medicines can make you feel dizzy. This can increase your chance of falling. Ask your doctor what other things that you can do to help prevent falls. This information is not intended to replace advice given to you by your health care provider. Make sure you discuss any questions you have with your health care provider. Document Released: 09/29/2009 Document Revised: 05/10/2016 Document Reviewed: 01/07/2015 Elsevier Interactive Patient Education  2017 [employer].

## 2023-02-22 NOTE — Progress Notes (Signed)
Subjective:   Maria Hamilton is a 72 y.o. female who presents for Medicare Annual (Subsequent) preventive examination.  Review of Systems    No ROS.  Medicare Wellness Virtual Visit.  Visual/audio telehealth visit, UTA vital signs.   See social history for additional risk factors.   Cardiac Risk Factors include: advanced age (>39mn, >>38women);hypertension     Objective:    Today's Vitals   02/22/23 1116  Weight: 201 lb (91.2 kg)  Height: '6\' 1"'$  (1.854 m)   Body mass index is 26.52 kg/m.     02/22/2023   11:24 AM 08/03/2022   10:07 AM 01/26/2022   12:52 PM 12/22/2021    8:53 AM 04/05/2021   10:12 PM 01/25/2021   12:48 PM 12/12/2020    4:34 PM  Advanced Directives  Does Patient Have a Medical Advance Directive? No No No No No No No  Would patient like information on creating a medical advance directive? No - Patient declined No - Patient declined No - Patient declined   No - Patient declined No - Patient declined    Current Medications (verified) Outpatient Encounter Medications as of 02/22/2023  Medication Sig   albuterol (VENTOLIN HFA) 108 (90 Base) MCG/ACT inhaler Inhale 2 puffs into the lungs every 6 (six) hours as needed for wheezing or shortness of breath.   amLODipine (NORVASC) 10 MG tablet TAKE 1 TABLET EVERY DAY   calcium carbonate (OSCAL) 1500 (600 Ca) MG TABS tablet Take 600 mg of elemental calcium by mouth daily with breakfast.   carvedilol (COREG) 12.5 MG tablet Take 1 tablet (12.5 mg total) by mouth 2 (two) times daily with a meal.   cholecalciferol (VITAMIN D) 1000 units tablet Take 1,000 Units by mouth daily.   DULoxetine (CYMBALTA) 60 MG capsule Take 1 capsule (60 mg total) by mouth daily.   gabapentin (NEURONTIN) 600 MG tablet TAKE 1 TABLET AT BEDTIME   omeprazole (PRILOSEC OTC) 20 MG tablet Take 1 tablet (20 mg total) by mouth daily.   potassium chloride SA (KLOR-CON M) 20 MEQ tablet TAKE 1 TABLET EVERY DAY   rosuvastatin (CRESTOR) 20 MG tablet TAKE 1  TABLET EVERY DAY   spironolactone (ALDACTONE) 25 MG tablet TAKE 1/2 TABLET EVERY MORNING   sucralfate (CARAFATE) 1 g tablet Take 1 tablet (1 g total) by mouth 4 (four) times daily as needed (for abdominal discomfort, nausea, and/or vomiting).   tiZANidine (ZANAFLEX) 2 MG tablet TAKE 2 TABLETS(4 MG) BY MOUTH AT BEDTIME AS NEEDED FOR MUSCLE SPASMS   triamcinolone ointment (KENALOG) 0.5 % Apply 1 application topically 2 (two) times daily. Prn Left neck and right inner arm x1-2 weeks prn   Zinc Oxide (DESITIN) 40 % PSTE Apply to affected area as needed   No facility-administered encounter medications on file as of 02/22/2023.    Allergies (verified) Ace inhibitors, Ciprofloxacin, Fentanyl, Hctz [hydrochlorothiazide], Latex, Morphine and related, Penicillin g benzathine, Isovue m [iopamidol], and Tape   History: Past Medical History:  Diagnosis Date   Acute anxiety 08/03/2015   Overview:  Last Assessment & Plan:  Klonopin most helpful  Reports past taking it 1 tab am 1/2 tab midday and 1 tab pm  30 day supply faxed to pharmacy   Acute bronchitis 12/31/2016   Acute pancreatitis 10/14/2016   Altered mental status 10/12/2016   Anxiety    Bronchitis    Chronic hip pain    Chronic pain syndrome    Depression    Edema    Fatigue  Fibromyalgia    GERD (gastroesophageal reflux disease)    Headache    Hypertension    Insomnia    Low back pain    Right upper quadrant abdominal pain    Vitamin D deficiency    Past Surgical History:  Procedure Laterality Date   ABDOMINAL HYSTERECTOMY     BACK SURGERY     BREAST BIOPSY Left 90s   benign   BREAST SURGERY     CHOLECYSTECTOMY N/A 10/16/2016   Procedure: LAPAROSCOPIC CHOLECYSTECTOMY WITH INTRAOPERATIVE CHOLANGIOGRAM;  Surgeon: Dia Crawford III, MD;  Location: [facility] ORS;  Service: General;  Laterality: N/A;   COLONOSCOPY WITH PROPOFOL N/A 12/22/2021   Procedure: COLONOSCOPY WITH PROPOFOL;  Surgeon: Jonathon Bellows, MD;  Location: [facility];   Service: Gastroenterology;  Laterality: N/A;   cyst removal from wrist     ECTOPIC PREGNANCY SURGERY     HIP SURGERY     x4   OOPHORECTOMY     Family History  Problem Relation Age of Onset   Heart disease Mother    Cancer Father    Alcohol abuse Father    Social History   Socioeconomic History   Marital status: Married    Spouse name: Not on file   Number of children: Not on file   Years of education: Not on file   Highest education level: Not on file  Occupational History   Not on file  Tobacco Use   Smoking status: Never   Smokeless tobacco: Never  Vaping Use   Vaping Use: Never used  Substance and Sexual Activity   Alcohol use: No    Alcohol/week: 0.0 standard drinks of alcohol   Drug use: No   Sexual activity: Yes  Other Topics Concern   Not on file  Social History Narrative   Not on file   Social Determinants of Health   Financial Resource Strain: Low Risk  (02/22/2023)   Overall Financial Resource Strain (CARDIA)    Difficulty of Paying Living Expenses: Not hard at all  Food Insecurity: No Food Insecurity (02/22/2023)   Hunger Vital Sign    Worried About Running Out of Food in the Last Year: Never true    Ran Out of Food in the Last Year: Never true  Transportation Needs: No Transportation Needs (02/22/2023)   PRAPARE - Hydrologist (Medical): No    Lack of Transportation (Non-Medical): No  Physical Activity: Not on file  Stress: No Stress Concern Present (02/22/2023)   [name]    Feeling of Stress : Not at all  Social Connections: Unknown (02/22/2023)   Social Connection and Isolation Panel [NHANES]    Frequency of Communication with Friends and Family: Not on file    Frequency of Social Gatherings with Friends and Family: Not on file    Attends Religious Services: Not on file    Active Member of Clubs or Organizations: Not on file    Attends Archivist  Meetings: Not on file    Marital Status: Married    Tobacco Counseling Counseling given: Not Answered   Clinical Intake:  Pre-visit preparation completed: Yes        Diabetes: No  How often do you need to have someone help you when you read instructions, pamphlets, or other written materials from your doctor or pharmacy?: 1 - Never    Interpreter Needed?: No      Activities of Daily Living  02/22/2023   11:24 AM  In your present state of health, do you have any difficulty performing the following activities:  Hearing? 0  Vision? 0  Difficulty concentrating or making decisions? 0  Walking or climbing stairs? 0  Comment Paces self with activity  Dressing or bathing? 0  Doing errands, shopping? 0  Preparing Food and eating ? N  Using the Toilet? N  In the past six months, have you accidently leaked urine? N  Do you have problems with loss of bowel control? N  Managing your Medications? N  Managing your Finances? N  Housekeeping or managing your Housekeeping? N  Comment Paces self with activity    Patient Care Team: Leone Haven, MD as PCP - General (Family Medicine)  Indicate any recent Medical Services you may have received from other than [facility] providers in the past year (date may be approximate).     Assessment:   This is a routine wellness examination for [place].  I connected with  Kathlene Cote on 02/22/23 by a audio enabled telemedicine application and verified that I am speaking with the correct person using two identifiers.  Patient Location: Home  Provider Location: Office/Clinic  I discussed the limitations of evaluation and management by telemedicine. The patient expressed understanding and agreed to proceed.   Hearing/Vision screen Hearing Screening - Comments:: Patient is able to hear conversational tones without difficulty. No issues reported. Vision Screening - Comments:: Followed by Dr. Matilde Sprang Wears corrective lenses when  reading small print Cataract extraction, bilateral They have seen their ophthalmologist in the last 12 months.    Dietary issues and exercise activities discussed: Current Exercise Habits: Home exercise routine, Type of exercise: stretching, Intensity: Mild She tries to have a healthy diet.    Goals Addressed             This Visit's Progress    Prevent falls   On track    Do not stand on chairs when reaching for objects.  Pace self when walking.         Depression Screen    02/22/2023   11:19 AM 01/23/2023    8:57 AM 01/15/2023    2:02 PM 12/19/2022    1:32 PM 10/10/2022   11:03 AM 12/14/2021   12:15 PM 01/25/2021   12:45 PM  PHQ 2/9 Scores  PHQ - 2 Score 0 0 4 0 0 0 0  PHQ- 9 Score 0 0 13 0       Fall Risk    02/22/2023   11:23 AM 02/21/2023    2:19 PM 01/30/2023    9:03 AM 01/23/2023    8:57 AM 01/15/2023    2:02 PM  Fall Risk   Falls in the past year? 0 0 0 0 0  Number falls in past yr: 0   0 0  Injury with Fall? 0   0 0  Risk for fall due to :    No Fall Risks No Fall Risks  Follow up Falls evaluation completed;Falls prevention discussed   Falls evaluation completed Falls evaluation completed    FALL RISK PREVENTION PERTAINING TO THE HOME: Home free of loose throw rugs in walkways, pet beds, electrical cords, etc? Yes  Adequate lighting in your home to reduce risk of falls? Yes   ASSISTIVE DEVICES UTILIZED TO PREVENT FALLS: Life alert? No  Use of a cane, walker or w/c? No   TIMED UP AND GO: Was the test performed? No .   Cognitive  Function:        02/22/2023   11:25 AM 12/01/2021    2:34 PM 01/25/2021   12:50 PM 01/25/2020   12:43 PM  6CIT Screen  What Year? 0 points 0 points 0 points 0 points  What month? 0 points  0 points 0 points  What time? 0 points  0 points 0 points  Count back from 20 0 points  0 points 0 points  Months in reverse 0 points  0 points 2 points  Repeat phrase 0 points     Total Score 0 points       Immunizations Immunization  History  Administered Date(s) Administered   Influenza, High Dose Seasonal PF 10/16/2017   Influenza,inj,Quad PF,6+ Mos 10/17/2016   Influenza-Unspecified 10/29/2012   Pneumococcal Polysaccharide-23 10/17/2016   Td 09/09/2001   Tdap 06/10/2012   TDAP status: Due, Education has been provided regarding the importance of this vaccine. Advised may receive this vaccine at local pharmacy or Health Dept. Aware to provide a copy of the vaccination record if obtained from local pharmacy or Health Dept. Verbalized acceptance and understanding.  Pneumococcal vaccine status: Declined,  Education has been provided regarding the importance of this vaccine but patient still declined. Advised may receive this vaccine at local pharmacy or Health Dept. Aware to provide a copy of the vaccination record if obtained from local pharmacy or Health Dept. Verbalized acceptance and understanding.   Covid-19 vaccine status: Declined, Education has been provided regarding the importance of this vaccine but patient still declined. Advised may receive this vaccine at local pharmacy or Health Dept.or vaccine clinic. Aware to provide a copy of the vaccination record if obtained from local pharmacy or Health Dept. Verbalized acceptance and understanding.  Screening Tests Health Maintenance  Topic Date Due   DTaP/Tdap/Td (3 - Td or Tdap) 06/10/2022   COVID-19 Vaccine (1) 03/10/2023 (Originally 04/07/1956)   INFLUENZA VACCINE  03/17/2023 (Originally 07/17/2022)   Zoster Vaccines- Shingrix (1 of 2) 04/16/2023 (Originally 04/07/1970)   Pneumonia Vaccine 65+ Years old (2 of 2 - PCV) 02/22/2024 (Originally 10/17/2017)   Medicare Annual Wellness ([place])  02/22/2024   MAMMOGRAM  09/07/2024   COLONOSCOPY (Pts 45-67yr Insurance coverage will need to be confirmed)  12/23/2031   DEXA SCAN  Completed   Hepatitis C Screening  Completed   HPV VACCINES  Aged Out    Health Maintenance Health Maintenance Due  Topic Date Due   DTaP/Tdap/Td  (3 - Td or Tdap) 06/10/2022   Mammogram- ordered.   Lung Cancer Screening: (Low Dose CT Chest recommended if Age 72-80years, 30 pack-year currently smoking OR have quit w/in 15years.) does not qualify.   Hepatitis C Screening: Completed 01/2020.   Vision Screening: Recommended annual ophthalmology exams for early detection of glaucoma and other disorders of the eye.  Dental Screening: Recommended annual dental exams for proper oral hygiene  Community Resource Referral / Chronic Care Management: CRR required this visit?  No   CCM required this visit?  No      Plan:     I have personally reviewed and noted the following in the patient's chart:   Medical and social history Use of alcohol, tobacco or illicit drugs  Current medications and supplements including opioid prescriptions. Patient is not currently taking opioid prescriptions. Functional ability and status Nutritional status Physical activity Advanced directives List of other physicians Hospitalizations, surgeries, and ER visits in previous 12 months Vitals Screenings to include cognitive, depression, and falls Referrals and appointments  In addition, I have reviewed and discussed with patient certain preventive protocols, quality metrics, and best practice recommendations. A written personalized care plan for preventive services as well as general preventive health recommendations were provided to patient.     Leta Jungling, LPN   [phone]

## 2023-02-25 ENCOUNTER — Telehealth: Payer: Self-pay | Admitting: Pain Medicine

## 2023-02-25 NOTE — Telephone Encounter (Signed)
Called patient and she states that her arms woke up and she is no longer having a problem. Instructed her to notify us of any problems.

## 2023-02-25 NOTE — Telephone Encounter (Signed)
Patient states she is still in pain/ woke up with both her arms numb. She wants to speak with someone.

## 2023-03-05 ENCOUNTER — Ambulatory Visit
Admission: RE | Admit: 2023-03-05 | Discharge: 2023-03-05 | Disposition: A | Discharge status: Home / Self Care | Payer: Medicare HMO | Source: Ambulatory Visit | Attending: Pain Medicine | Admitting: Pain Medicine

## 2023-03-05 ENCOUNTER — Encounter: Payer: Self-pay | Admitting: Pain Medicine

## 2023-03-05 ENCOUNTER — Ambulatory Visit: Payer: Medicare HMO | Attending: Pain Medicine | Admitting: Pain Medicine

## 2023-03-05 VITALS — BP 173/88 | HR 66 | Temp 97.4°F | Resp 14 | Ht 73.0 in | Wt 201.0 lb

## 2023-03-05 DIAGNOSIS — R200 Anesthesia of skin: Secondary | ICD-10-CM | POA: Insufficient documentation

## 2023-03-05 DIAGNOSIS — M5416 Radiculopathy, lumbar region: Secondary | ICD-10-CM | POA: Insufficient documentation

## 2023-03-05 DIAGNOSIS — M79604 Pain in right leg: Secondary | ICD-10-CM | POA: Insufficient documentation

## 2023-03-05 DIAGNOSIS — M79605 Pain in left leg: Secondary | ICD-10-CM | POA: Diagnosis not present

## 2023-03-05 DIAGNOSIS — Z91040 Latex allergy status: Secondary | ICD-10-CM | POA: Diagnosis present

## 2023-03-05 DIAGNOSIS — G8929 Other chronic pain: Secondary | ICD-10-CM | POA: Insufficient documentation

## 2023-03-05 DIAGNOSIS — M5417 Radiculopathy, lumbosacral region: Secondary | ICD-10-CM | POA: Insufficient documentation

## 2023-03-05 DIAGNOSIS — Z91041 Radiographic dye allergy status: Secondary | ICD-10-CM | POA: Diagnosis present

## 2023-03-05 DIAGNOSIS — G96198 Other disorders of meninges, not elsewhere classified: Secondary | ICD-10-CM

## 2023-03-05 DIAGNOSIS — M961 Postlaminectomy syndrome, not elsewhere classified: Secondary | ICD-10-CM | POA: Diagnosis not present

## 2023-03-05 DIAGNOSIS — M5136 Other intervertebral disc degeneration, lumbar region: Secondary | ICD-10-CM | POA: Insufficient documentation

## 2023-03-05 DIAGNOSIS — R202 Paresthesia of skin: Secondary | ICD-10-CM | POA: Diagnosis present

## 2023-03-05 MED ORDER — LIDOCAINE HCL 2 % IJ SOLN
20.0000 mL | Freq: Once | INTRAMUSCULAR | Status: AC
  Administered 2023-03-05: 100 mg
  Filled 2023-03-05: qty 40

## 2023-03-05 MED ORDER — MIDAZOLAM HCL 2 MG/2ML IJ SOLN
0.5000 mg | Freq: Once | INTRAMUSCULAR | Status: AC
  Administered 2023-03-05: 2 mg via INTRAVENOUS
  Filled 2023-03-05: qty 2

## 2023-03-05 MED ORDER — TRIAMCINOLONE ACETONIDE 40 MG/ML IJ SUSP
40.0000 mg | Freq: Once | INTRAMUSCULAR | Status: AC
  Administered 2023-03-05: 40 mg
  Filled 2023-03-05: qty 1

## 2023-03-05 MED ORDER — SODIUM CHLORIDE 0.9% FLUSH
2.0000 mL | Freq: Once | INTRAVENOUS | Status: AC
  Administered 2023-03-05: 2 mL

## 2023-03-05 MED ORDER — PENTAFLUOROPROP-TETRAFLUOROETH EX AERO
INHALATION_SPRAY | Freq: Once | CUTANEOUS | Status: AC
  Administered 2023-03-05: 30 via TOPICAL
  Filled 2023-03-05: qty 116

## 2023-03-05 MED ORDER — ROPIVACAINE HCL 2 MG/ML IJ SOLN
2.0000 mL | Freq: Once | INTRAMUSCULAR | Status: AC
  Administered 2023-03-05: 2 mL via EPIDURAL
  Filled 2023-03-05: qty 20

## 2023-03-05 MED ORDER — LACTATED RINGERS IV SOLN: Freq: Once | INTRAVENOUS | Status: AC

## 2023-03-05 NOTE — Progress Notes (Signed)
PROVIDER NOTE: Interpretation of information contained herein should be left to medically-trained personnel. Specific patient instructions are provided elsewhere under "Patient Instructions" section of medical record. This document was created in part using STT-dictation technology, any transcriptional errors that may result from this process are unintentional.  [name] Type: Established DOB: [birth year] MRN: [id number] PCP: Glori Luis, MD  Service: Procedure DOS: 03/05/2023 Setting: Ambulatory Location: Ambulatory outpatient facility Delivery: Face-to-face Provider: Oswaldo Done, MD Specialty: Interventional Pain Management Specialty designation: 09 Location: Outpatient facility Ref. Prov.: Delano Metz, MD       Interventional Therapy   Procedure: Caudal Epidural Steroid Injection   #3  Laterality: Midline aiming left Level: Sacrococcygeal ligament  Imaging: Fluoroscopy-guided         Anesthesia: Local anesthesia (1-2% Lidocaine) Anxiolysis: IV Versed 2.0 mg Sedation: No Sedation                       DOS: 03/05/2023  Performed by: Oswaldo Done, MD  Purpose: Diagnostic/Therapeutic Indications: Low back and lower extremity pain severe enough to impact quality of life or function. Rationale (medical necessity): procedure needed and proper for the diagnosis and/or treatment of Ms. Vanginkel medical symptoms and needs. 1. Lumbosacral radiculopathy at L5 (Bilateral)   2. Chronic lower extremity pain (1ry area of Pain) (Bilateral) (L>R)   3. Chronic lumbar radicular pain (L5/S1 dermatome) (Bilateral) (L>R)   4. DDD (degenerative disc disease), lumbar   5. Epidural fibrosis   6. Failed back surgical syndrome (x 3)   7. Numbness and tingling of feet (Bilateral) (L>R)    History of allergy to radiographic contrast media    Latex precautions, history of latex allergy    NAS-11 Pain score:   Pre-procedure: 10-Worst pain ever/10    Post-procedure: 2 /10     Target: Lumbosacral epidural canal  Location: Epidural space  Region: Caudal canal Approach: Percutaneous  Type of procedure: Epidural block  Position  Prep  Materials:  Position: Prone  Prep solution: DuraPrep (Iodine Povacrylex [0.7% available iodine] and Isopropyl Alcohol, 74% w/w) Prep Area: Entire posterior lumbosacral area  Materials:  Tray: Epidural Needle(s):  Type: Epidural  Gauge (G): 17  Length: 3.5-in  Qty: 1   Pre-op H&P Assessment:  Maria Hamilton is a 72 y.o. (year old), female patient, seen today for interventional treatment. She  has a past surgical history that includes Breast surgery; Back surgery; Ectopic pregnancy surgery; Hip surgery; Abdominal hysterectomy; cyst removal from wrist; Cholecystectomy (N/A, 10/16/2016); Oophorectomy; Breast biopsy (Left, 90s); and Colonoscopy with propofol (N/A, 12/22/2021). Maria Hamilton has a current medication list which includes the following prescription(s): albuterol, amlodipine, calcium carbonate, carvedilol, cholecalciferol, duloxetine, gabapentin, potassium chloride sa, rosuvastatin, spironolactone, sucralfate, tizanidine, triamcinolone ointment, desitin, and omeprazole, and the following Facility-Administered Medications: lactated ringers. Her primarily concern today is the Back Pain (lower)  Initial Vital Signs:  Pulse/HCG Rate: 66ECG Heart Rate: 68 Temp: (!) 97.4 F (36.3 C) Resp: 18 BP: (!) 163/90 SpO2: 100 %  BMI: Estimated body mass index is 26.52 kg/m as calculated from the following:   Height as of this encounter: 6\' 1"  (1.854 m).   Weight as of this encounter: 201 lb (91.2 kg).  Risk Assessment: Allergies: Reviewed. She is allergic to ace inhibitors, ciprofloxacin, fentanyl, hctz [hydrochlorothiazide], latex, morphine and related, penicillin g benzathine, isovue m [iopamidol], and tape.  Allergy Precautions: None required Coagulopathies: Reviewed. None identified.  Blood-thinner  therapy: None at this time Active Infection(s): Reviewed.  None identified. Maria Hamilton is afebrile  Site Confirmation: Ms. Scovel was asked to confirm the procedure and laterality before marking the site Procedure checklist: Completed Consent: Before the procedure and under the influence of no sedative(s), amnesic(s), or anxiolytics, the patient was informed of the treatment options, risks and possible complications. To fulfill our ethical and legal obligations, as recommended by the American Medical Association's Code of Ethics, I have informed the patient of my clinical impression; the nature and purpose of the treatment or procedure; the risks, benefits, and possible complications of the intervention; the alternatives, including doing nothing; the risk(s) and benefit(s) of the alternative treatment(s) or procedure(s); and the risk(s) and benefit(s) of doing nothing. The patient was provided information about the general risks and possible complications associated with the procedure. These may include, but are not limited to: failure to achieve desired goals, infection, bleeding, organ or nerve damage, allergic reactions, paralysis, and death. In addition, the patient was informed of those risks and complications associated to Spine-related procedures, such as failure to decrease pain; infection (i.e.: Meningitis, epidural or intraspinal abscess); bleeding (i.e.: epidural hematoma, subarachnoid hemorrhage, or any other type of intraspinal or peri-dural bleeding); organ or nerve damage (i.e.: Any type of peripheral nerve, nerve root, or spinal cord injury) with subsequent damage to sensory, motor, and/or autonomic systems, resulting in permanent pain, numbness, and/or weakness of one or several areas of the body; allergic reactions; (i.e.: anaphylactic reaction); and/or death. Furthermore, the patient was informed of those risks and complications associated with the medications. These include, but are  not limited to: allergic reactions (i.e.: anaphylactic or anaphylactoid reaction(s)); adrenal axis suppression; blood sugar elevation that in diabetics may result in ketoacidosis or comma; water retention that in patients with history of congestive heart failure may result in shortness of breath, pulmonary edema, and decompensation with resultant heart failure; weight gain; swelling or edema; medication-induced neural toxicity; particulate matter embolism and blood vessel occlusion with resultant organ, and/or nervous system infarction; and/or aseptic necrosis of one or more joints. Finally, the patient was informed that Medicine is not an exact science; therefore, there is also the possibility of unforeseen or unpredictable risks and/or possible complications that may result in a catastrophic outcome. The patient indicated having understood very clearly. We have given the patient no guarantees and we have made no promises. Enough time was given to the patient to ask questions, all of which were answered to the patient's satisfaction. Ms. Mcconnico has indicated that she wanted to continue with the procedure. Attestation: I, the ordering provider, attest that I have discussed with the patient the benefits, risks, side-effects, alternatives, likelihood of achieving goals, and potential problems during recovery for the procedure that I have provided informed consent. Date  Time: 03/05/2023 10:04 AM   Imaging Guidance (Spinal):          Type of Imaging Technique: Fluoroscopy Guidance (Spinal) Indication(s): Assistance in needle guidance and placement for procedures requiring needle placement in or near specific anatomical locations not easily accessible without such assistance. Exposure Time: Please see nurses notes. Contrast: None used. Fluoroscopic Guidance: I was personally present during the use of fluoroscopy. "Tunnel Vision Technique" used to obtain the best possible view of the target area. Parallax  error corrected before commencing the procedure. "Direction-depth-direction" technique used to introduce the needle under continuous pulsed fluoroscopy. Once target was reached, antero-posterior, oblique, and lateral fluoroscopic projection used confirm needle placement in all planes. Images permanently stored in EMR. Interpretation: No contrast injected. I personally  interpreted the imaging intraoperatively. Adequate needle placement confirmed in multiple planes. Permanent images saved into the patient's record.  Pre-Procedure Preparation:  Monitoring: As per clinic protocol. Respiration, ETCO2, SpO2, BP, heart rate and rhythm monitor placed and checked for adequate function Safety Precautions: Patient was assessed for positional comfort and pressure points before starting the procedure. Time-out: I initiated and conducted the "Time-out" before starting the procedure, as per protocol. The patient was asked to participate by confirming the accuracy of the "Time Out" information. Verification of the correct person, site, and procedure were performed and confirmed by me, the nursing staff, and the patient. "Time-out" conducted as per Joint Commission's Universal Protocol (UP.01.01.01). Time: 1102  Description  Narrative of Procedure:          Start Time: 1102 hrs.  Technical description of procedure:  Safety Precautions: Aspiration looking for blood return was conducted prior to all injections. At no point did we inject any substances, as a needle was being advanced. No attempts were made at seeking any paresthesias. Safe injection practices and needle disposal techniques used. Medications properly checked for expiration dates. SDV (single dose vial) medications used. Description of the Procedure: Protocol guidelines were followed. The patient was placed in position over the fluoroscopy table. The target area was identified and the area prepped in the usual manner. Skin & deeper tissues infiltrated with  local anesthetic. Appropriate amount of time allowed to pass for local anesthetics to take effect. The procedure needle was then advanced to the target area. Proper needle placement secured. Negative aspiration confirmed.  Solution injected in intermittent fashion, asking for systemic symptoms every 0.5cc of injectate. The needle/catheter were removed and the area cleansed, making sure to leave some of the prepping solution back to take advantage of its long term bactericidal properties.  Vitals:   03/05/23 1003 03/05/23 1100 03/05/23 1106 03/05/23 1112  BP: (!) 163/90 (!) 197/107 (!) 180/104 (!) 173/88  Pulse: 66     Resp:  18 16 14   Temp: (!) 97.4 F (36.3 C)     SpO2: 100% 100% 99% 100%  Weight: 201 lb (91.2 kg)     Height: 6\' 1"  (1.854 m)        End Time: 1105 hrs.  Post-operative Assessment:  Post-procedure Vital Signs:  Pulse/HCG Rate: 6678 Temp: (!) 97.4 F (36.3 C) Resp: 14 BP: (!) 173/88 SpO2: 100 %  EBL: None  Complications: No immediate post-treatment complications observed by team, or reported by patient.  Note: The patient tolerated the entire procedure well. A repeat set of vitals were taken after the procedure and the patient was kept under observation following institutional policy, for this type of procedure. Post-procedural neurological assessment was performed, showing return to baseline, prior to discharge. The patient was provided with post-procedure discharge instructions, including a section on how to identify potential problems. Should any problems arise concerning this procedure, the patient was given instructions to immediately contact us, at any time, without hesitation. In any case, we plan to contact the patient by telephone for a follow-up status report regarding this interventional procedure.  Comments:  No additional relevant information.  Plan of Care (POC)  Orders:  Orders Placed This Encounter  Procedures   Caudal Epidural Injection     Scheduling Instructions:     Laterality: Left-sided     Level(s): Sacrococcygeal canal (Tailbone area)     Sedation: Patient's choice     Timeframe: Today    Order Specific Question:   Where will this  procedure be performed?    Answer:   [facility]   DG [facility] C-ARM 1-60 MIN NO REPORT    Intraoperative interpretation by procedural physician at [facility].    Standing Status:   Standing    Number of Occurrences:   1    Order Specific Question:   Reason for exam:    Answer:   Assistance in needle guidance and placement for procedures requiring needle placement in or near specific anatomical locations not easily accessible without such assistance.   Informed Consent Details: Physician/Practitioner Attestation; Transcribe to consent form and obtain patient signature    Nursing Order: Transcribe to consent form and obtain patient signature. Note: Always confirm laterality of pain with Ms. Wildt, before procedure.    Order Specific Question:   Physician/Practitioner attestation of informed consent for procedure/surgical case    Answer:   I, the physician/practitioner, attest that I have discussed with the patient the benefits, risks, side effects, alternatives, likelihood of achieving goals and potential problems during recovery for the procedure that I have provided informed consent.    Order Specific Question:   Procedure    Answer:   Caudal epidural steroid injection    Order Specific Question:   Physician/Practitioner performing the procedure    Answer:   Geneva Barrero A. Laban Emperor, MD    Order Specific Question:   Indication/Reason    Answer:   Low back pain and lower extremity pain secondary to lumbosacral radiculitis   Provide equipment / supplies at bedside    Procedural tray: Epidural Tray (Disposable  single use) Skin infiltration needle: Regular 1.5-in, 25-G, (x1) Block needle size: Regular standard Catheter: No catheter required    Standing Status:    Standing    Number of Occurrences:   1    Order Specific Question:   Specify    Answer:   Epidural Tray   Miscellanous precautions    Standing Status:   Standing    Number of Occurrences:   1   Latex precautions    Activate Latex-Free Protocol.    Standing Status:   Standing    Number of Occurrences:   1   Chronic Opioid Analgesic:  None MME/day: 0 mg/day   Medications ordered for procedure: Meds ordered this encounter  Medications   lidocaine (XYLOCAINE) 2 % (with pres) injection 400 mg   pentafluoroprop-tetrafluoroeth (GEBAUERS) aerosol   lactated ringers infusion   midazolam (VERSED) injection 0.5-2 mg    Make sure Flumazenil is available in the pyxis when using this medication. If oversedation occurs, administer 0.2 mg IV over 15 sec. If after 45 sec no response, administer 0.2 mg again over 1 min; may repeat at 1 min intervals; not to exceed 4 doses (1 mg)   sodium chloride flush (NS) 0.9 % injection 2 mL   ropivacaine (PF) 2 mg/mL (0.2%) (NAROPIN) injection 2 mL   triamcinolone acetonide (KENALOG-40) injection 40 mg   Medications administered: We administered lidocaine, pentafluoroprop-tetrafluoroeth, lactated ringers, midazolam, sodium chloride flush, ropivacaine (PF) 2 mg/mL (0.2%), and triamcinolone acetonide.  See the medical record for exact dosing, route, and time of administration.  Follow-up plan:   Return in about 2 weeks (around 03/19/2023) for Proc-day (T,Th), (Face2F), (PPE).       Interventional Therapies  Risk Factors  Considerations:  ALLERGY: CONTRAST DYE  Fentanyl.  (Extremely poor historian) (poor intraprocedure compliance) (symptom exaggeration)  GERD  HTN  Anxiety  Memory impairment     Planned  Pending:  Diagnostic/therapeutic left caudal ESI #1    Under consideration:      Completed: (Extremely poor historian)  Diagnostic/therapeutic left L5 and S1 TFESI x1 (02/07/2023) (100/100/40/0)  Therapeutic left caudal ESI x2 (02/04/2017)   Therapeutic left L2-3 LESI x2 (09/03/2017)  Therapeutic left L2 TFESI x2 (09/03/2017)  Therapeutic left RACZ procedure x1 (10/31/2017)  Diagnostic left lumbar facet MBB x1 (05/08/2018) (did not keep follow-up) (100/100/100/100 as of 01/30/2023)    Completed by other providers:   Diagnostic EMG/PNCV (lower extremity) (02/18/2017) by Theora Master, MD [facility] neurolog[facility]) Dx: Severe, mixed, acquired polyneuropathy of the lower extremities.   Therapeutic  Palliative (PRN) options:   None established        Recent Visits Date Type Provider Dept  02/21/23 Office Visit Delano Metz, MD [facility]  02/07/23 Procedure visit Delano Metz, MD [facility]  01/30/23 Office Visit Delano Metz, MD [facility]  Showing recent visits within past 90 days and meeting all other requirements Today's Visits Date Type Provider Dept  03/05/23 Procedure visit Delano Metz, MD [facility]  Showing today's visits and meeting all other requirements Future Appointments Date Type Provider Dept  03/21/23 Appointment Delano Metz, MD [facility]  Showing future appointments within next 90 days and meeting all other requirements  Disposition: Discharge home  Discharge (Date  Time): 03/05/2023; 1120 hrs.   Primary Care Physician: Glori Luis, MD Location: [facility] Note by: Oswaldo Done, MD (TTS technology used. I apologize for any typographical errors that were not detected and corrected.) Date: 03/05/2023; Time: 11:19 AM  Disclaimer:  Medicine is not an Visual merchandiser. The only guarantee in medicine is that nothing is guaranteed. It is important to note that the decision to proceed with this intervention was based on the information collected from the patient. The Data and conclusions were drawn from the patient's questionnaire, the interview, and the physical examination. Because the  information was provided in large part by the patient, it cannot be guaranteed that it has not been purposely or unconsciously manipulated. Every effort has been made to obtain as much relevant data as possible for this evaluation. It is important to note that the conclusions that lead to this procedure are derived in large part from the available data. Always take into account that the treatment will also be dependent on availability of resources and existing treatment guidelines, considered by other Pain Management Practitioners as being common knowledge and practice, at the time of the intervention. For Medico-Legal purposes, it is also important to point out that variation in procedural techniques and pharmacological choices are the acceptable norm. The indications, contraindications, technique, and results of the above procedure should only be interpreted and judged by a Board-Certified Interventional Pain Specialist with extensive familiarity and expertise in the same exact procedure and technique.

## 2023-03-05 NOTE — Patient Instructions (Signed)

## 2023-03-06 ENCOUNTER — Telehealth: Payer: Self-pay

## 2023-03-06 NOTE — Telephone Encounter (Signed)
Post procedure follow up.  LM 

## 2023-03-11 ENCOUNTER — Encounter: Payer: Self-pay | Admitting: Pharmacist

## 2023-03-13 ENCOUNTER — Other Ambulatory Visit: Payer: Self-pay | Admitting: Family Medicine

## 2023-03-13 DIAGNOSIS — I10 Essential (primary) hypertension: Secondary | ICD-10-CM

## 2023-03-20 ENCOUNTER — Ambulatory Visit (INDEPENDENT_AMBULATORY_CARE_PROVIDER_SITE_OTHER): Payer: Medicare HMO | Admitting: Family Medicine

## 2023-03-20 ENCOUNTER — Encounter: Payer: Self-pay | Admitting: Family Medicine

## 2023-03-20 VITALS — BP 140/74 | HR 81 | Temp 98.6°F | Ht 73.0 in | Wt 213.2 lb

## 2023-03-20 DIAGNOSIS — G629 Polyneuropathy, unspecified: Secondary | ICD-10-CM

## 2023-03-20 DIAGNOSIS — I10 Essential (primary) hypertension: Secondary | ICD-10-CM | POA: Diagnosis not present

## 2023-03-20 DIAGNOSIS — I700 Atherosclerosis of aorta: Secondary | ICD-10-CM

## 2023-03-20 DIAGNOSIS — R252 Cramp and spasm: Secondary | ICD-10-CM | POA: Diagnosis not present

## 2023-03-20 DIAGNOSIS — E538 Deficiency of other specified B group vitamins: Secondary | ICD-10-CM

## 2023-03-20 DIAGNOSIS — E785 Hyperlipidemia, unspecified: Secondary | ICD-10-CM | POA: Diagnosis not present

## 2023-03-20 LAB — COMPREHENSIVE METABOLIC PANEL
ALT: 13 U/L (ref 0–35)
AST: 19 U/L (ref 0–37)
Albumin: 4.2 g/dL (ref 3.5–5.2)
Alkaline Phosphatase: 115 U/L (ref 39–117)
BUN: 10 mg/dL (ref 6–23)
CO2: 27 mEq/L (ref 19–32)
Calcium: 9.3 mg/dL (ref 8.4–10.5)
Chloride: 102 mEq/L (ref 96–112)
Creatinine, Ser: 0.94 mg/dL (ref 0.40–1.20)
GFR: 60.86 mL/min (ref >60.00)
Glucose, Bld: 85 mg/dL (ref 70–99)
Potassium: 3.9 mEq/L (ref 3.5–5.1)
Sodium: 136 mEq/L (ref 135–145)
Total Bilirubin: 0.5 mg/dL (ref 0.2–1.2)
Total Protein: 7.3 g/dL (ref 6.0–8.3)

## 2023-03-20 LAB — MAGNESIUM: Magnesium: 1.8 mg/dL (ref 1.5–2.5)

## 2023-03-20 LAB — VITAMIN B12: Vitamin B-12: 221 pg/mL (ref 211–911)

## 2023-03-20 NOTE — Assessment & Plan Note (Addendum)
Check lab work today.  Encouraged increasing water intake.  Discussed she could try yellow mustard as that could be beneficial for her cramps.

## 2023-03-20 NOTE — Assessment & Plan Note (Signed)
Chronic issue.  Continues to have pain in her left leg.  She will continue gabapentin 1200 mg nightly though she will see if pain management is okay with her taking a higher dose of this.  She will also continue Cymbalta 60 mg daily.  She will see pain management as scheduled tomorrow.

## 2023-03-20 NOTE — Assessment & Plan Note (Signed)
Chronic issue.  Well-controlled.  Continue Crestor 20 mg daily.

## 2023-03-20 NOTE — Progress Notes (Signed)
[name], MD Phone: [phone]  Maria Hamilton is a 72 y.o. female who presents today for f/u.  HYPERTENSION Disease Monitoring Home BP Monitoring not checking Chest pain- no    Dyspnea- no Medications Compliance-  taking amlodipine, coreg, spironolactone.  Edema- no BMET    Component Value Date/Time   NA 139 12/19/2022 1346   NA 137 03/02/2014 1131   K 4.1 12/19/2022 1346   K 3.2 (L) 03/02/2014 1131   CL 103 12/19/2022 1346   CL 104 03/02/2014 1131   CO2 27 12/19/2022 1346   CO2 30 03/02/2014 1131   GLUCOSE 105 (H) 12/19/2022 1346   GLUCOSE 88 03/02/2014 1131   BUN 8 12/19/2022 1346   BUN 5 (L) 03/02/2014 1131   CREATININE 0.84 12/19/2022 1346   CREATININE 1.00 (H) 02/05/2020 1350   CALCIUM 9.3 12/19/2022 1346   CALCIUM 8.5 03/02/2014 1131   GFRNONAA >60 04/05/2021 2228   GFRNONAA >60 03/02/2014 1131   GFRAA >60 07/10/2018 1315   GFRAA >60 03/02/2014 1131   HYPERLIPIDEMIA Symptoms Chest pain on exertion:  no   Medications: Compliance- taking crestor Right upper quadrant pain- no  Muscle aches- no Lipid Panel     Component Value Date/Time   CHOL 144 12/19/2022 1346   TRIG 91.0 12/19/2022 1346   HDL 64.10 12/19/2022 1346   CHOLHDL 2 12/19/2022 1346   VLDL 18.2 12/19/2022 1346   LDLCALC 61 12/19/2022 1346   LDLCALC 135 (H) 08/07/2019 1533   LDLDIRECT 55 02/05/2020 1350   Muscle cramps: Patient notes cramps in her legs and hands that occur throughout the day.  She does not drink a lot of water.  She does not stretch.  Left leg pain: Patient is seeing a pain specialist for this.  She has had multiple injections with little benefit.  She has trouble sleeping related to this.  She is on gabapentin and Cymbalta.  She notes she takes 2 gabapentin before bed and sometimes wakes up and takes 2 more at night.  When she takes the other 2 she is able to sleep.  No drowsiness the next day after taking the gabapentin.  Social History   Tobacco Use  Smoking  Status Never  Smokeless Tobacco Never    Current Outpatient Medications on File Prior to Visit  Medication Sig Dispense Refill   amLODipine (NORVASC) 10 MG tablet TAKE 1 TABLET EVERY DAY 90 tablet 3   calcium carbonate (OSCAL) 1500 (600 Ca) MG TABS tablet Take 600 mg of elemental calcium by mouth daily with breakfast.     carvedilol (COREG) 12.5 MG tablet TAKE 1 TABLET TWICE DAILY WITH MEALS 180 tablet 3   cholecalciferol (VITAMIN D) 1000 units tablet Take 1,000 Units by mouth daily.     DULoxetine (CYMBALTA) 60 MG capsule Take 1 capsule (60 mg total) by mouth daily. 90 capsule 3   gabapentin (NEURONTIN) 600 MG tablet TAKE 1 TABLET AT BEDTIME 90 tablet 3   omeprazole (PRILOSEC OTC) 20 MG tablet Take 1 tablet (20 mg total) by mouth daily. 28 tablet 0   potassium chloride SA (KLOR-CON M) 20 MEQ tablet TAKE 1 TABLET EVERY DAY 90 tablet 3   rosuvastatin (CRESTOR) 20 MG tablet TAKE 1 TABLET EVERY DAY 90 tablet 0   spironolactone (ALDACTONE) 25 MG tablet TAKE 1/2 TABLET EVERY MORNING 45 tablet 10   sucralfate (CARAFATE) 1 g tablet Take 1 tablet (1 g total) by mouth 4 (four) times daily as needed (for abdominal discomfort, nausea, and/or  vomiting). 30 tablet 0   tiZANidine (ZANAFLEX) 2 MG tablet TAKE 2 TABLETS(4 MG) BY MOUTH AT BEDTIME AS NEEDED FOR MUSCLE SPASMS 60 tablet 0   triamcinolone ointment (KENALOG) 0.5 % Apply 1 application topically 2 (two) times daily. Prn Left neck and right inner arm x1-2 weeks prn 60 g 0   Zinc Oxide (DESITIN) 40 % PSTE Apply to affected area as needed 56.7 g 1   No current facility-administered medications on file prior to visit.     ROS see history of present illness  Objective  Physical Exam Vitals:   03/20/23 0959 03/20/23 1008  BP: 132/82 (!) 140/74  Pulse: 81   Temp: 98.6 F (37 C)   SpO2: 98%     BP Readings from Last 3 Encounters:  03/20/23 (!) 140/74  03/05/23 (!) 173/88  02/21/23 (!) 157/92   Wt Readings from Last 3 Encounters:   03/20/23 213 lb 3.2 oz (96.7 kg)  03/05/23 201 lb (91.2 kg)  02/22/23 201 lb (91.2 kg)    Physical Exam Constitutional:      General: She is not in acute distress.    Appearance: She is not diaphoretic.  Cardiovascular:     Rate and Rhythm: Normal rate and regular rhythm.     Heart sounds: Normal heart sounds.  Pulmonary:     Effort: Pulmonary effort is normal.     Breath sounds: Normal breath sounds.  Skin:    General: Skin is warm and dry.  Neurological:     Mental Status: She is alert.      Assessment/Plan: Please see individual problem list.  Primary hypertension Assessment & Plan: Chronic issue.  Blood pressure elevated today though she is not checking at home.  For now she will continue amlodipine 10 mg daily, carvedilol 12.5 mg twice daily, and spironolactone 12.5 mg daily.  I have asked that she start checking her blood pressure at home and if it is running greater than 130/80 she will let us know.  Follow-up in 3 months.   Hyperlipidemia, unspecified hyperlipidemia type Assessment & Plan: Chronic issue.  Well-controlled.  Continue Crestor 20 mg daily.   Polyneuropathy Assessment & Plan: Chronic issue.  Continues to have pain in her left leg.  She will continue gabapentin 1200 mg nightly though she will see if pain management is okay with her taking a higher dose of this.  She will also continue Cymbalta 60 mg daily.  She will see pain management as scheduled tomorrow.   Aortic atherosclerosis Assessment & Plan: Continue risk factor management.  Discussed that with plaque in her aorta she likely has plaque elsewhere and we need to keep things well-controlled to minimize further risks.   Muscle cramps Assessment & Plan: Check lab work today.  Encouraged increasing water intake.  Discussed she could try yellow mustard as that could be beneficial for her cramps.  Orders: -     Comprehensive metabolic panel -     Magnesium  Vitamin B 12  deficiency Assessment & Plan: Check B12 today.  Orders: -     Vitamin B12     Return in about 3 months (around 06/19/2023).   [name], MD [place]

## 2023-03-20 NOTE — Assessment & Plan Note (Signed)
Check B12 today. 

## 2023-03-20 NOTE — Assessment & Plan Note (Addendum)
Chronic issue.  Blood pressure elevated today though she is not checking at home.  For now she will continue amlodipine 10 mg daily, carvedilol 12.5 mg twice daily, and spironolactone 12.5 mg daily.  I have asked that she start checking her blood pressure at home and if it is running greater than 130/80 she will let us know.  Follow-up in 3 months.

## 2023-03-20 NOTE — Assessment & Plan Note (Signed)
Continue risk factor management.  Discussed that with plaque in her aorta she likely has plaque elsewhere and we need to keep things well-controlled to minimize further risks.

## 2023-03-20 NOTE — Patient Instructions (Signed)
Please increase your water intake. You can try yellow mustard to see if that is beneficial for your cramps.

## 2023-03-21 ENCOUNTER — Ambulatory Visit: Payer: Medicare HMO | Attending: Pain Medicine | Admitting: Pain Medicine

## 2023-03-21 ENCOUNTER — Encounter: Payer: Self-pay | Admitting: Pain Medicine

## 2023-03-21 VITALS — BP 159/95 | HR 70 | Temp 97.6°F | Resp 18 | Ht 73.0 in | Wt 213.0 lb

## 2023-03-21 DIAGNOSIS — M961 Postlaminectomy syndrome, not elsewhere classified: Secondary | ICD-10-CM | POA: Diagnosis not present

## 2023-03-21 DIAGNOSIS — G8929 Other chronic pain: Secondary | ICD-10-CM

## 2023-03-21 DIAGNOSIS — M5442 Lumbago with sciatica, left side: Secondary | ICD-10-CM | POA: Diagnosis not present

## 2023-03-21 DIAGNOSIS — R937 Abnormal findings on diagnostic imaging of other parts of musculoskeletal system: Secondary | ICD-10-CM | POA: Diagnosis not present

## 2023-03-21 DIAGNOSIS — M5417 Radiculopathy, lumbosacral region: Secondary | ICD-10-CM

## 2023-03-21 DIAGNOSIS — M79605 Pain in left leg: Secondary | ICD-10-CM | POA: Insufficient documentation

## 2023-03-21 DIAGNOSIS — M4807 Spinal stenosis, lumbosacral region: Secondary | ICD-10-CM

## 2023-03-21 DIAGNOSIS — R94130 Abnormal response to nerve stimulation, unspecified: Secondary | ICD-10-CM

## 2023-03-21 NOTE — Progress Notes (Signed)
PROVIDER NOTE: Information contained herein reflects review and annotations entered in association with encounter. Interpretation of such information and data should be left to medically-trained personnel. Information provided to patient can be located elsewhere in the medical record under "Patient Instructions". Document created using STT-dictation technology, any transcriptional errors that may result from process are unintentional.    [name]  Service Category: E/M  Provider: Oswaldo Done, MD  DOB: [birth year]  DOS: 03/21/2023  Referring Provider: Glori Luis, MD  MRN: [id number]  Specialty: Interventional Pain Management  PCP: Glori Luis, MD  Type: Established Patient  Setting: Ambulatory outpatient    Location: Office  Delivery: Face-to-face     HPI  [name], a 72 y.o. year old female, is here today because of her Lumbosacral radiculopathy at L5 [M54.17]. [name] primary complain today is Back Pain  Pertinent problems: [name] has Rheumatoid factor positive; Fibromyalgia; Chronic low back pain (2ry area of Pain) (Left) w/ sciatica (Left); Chronic pain syndrome; Mechanical complication of internal joint prosthesis; Rheumatoid arthritis, multiple sites (positive RF); Chronic lower extremity pain (1ry area of Pain) (Bilateral) (L>R); Lumbar facet arthropathy (Bilateral); Failed back surgical syndrome (x 3); Epidural fibrosis; Neurogenic pain; Musculoskeletal pain; Chronic lumbar radicular pain (L5/S1 dermatome) (Bilateral) (L>R); History of total hip replacement, bilateral; Polyneuropathy; Post herpetic neuralgia; Abnormal MRI, lumbar spine (02/21/2020); 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); History of lumbar fusion (L3-4, L4-5, and L5-S1); Chronic foot numbness (Left); Ankle pain; DDD (degenerative disc disease), lumbar; Osteoarthritis of hip; Spondylosis without myelopathy or radiculopathy, lumbosacral region; Chronic  hip pain after total replacement of hip joint (Left); Chronic hip pain after total replacement of hip joint (Right); Chronic pain of hip  (Bilateral) (L>R); Chronic sacroiliac joint pain (Bilateral) (L>R); Lumbar facet syndrome (Bilateral) (L>R); Numbness and tingling of feet (Bilateral) (L>R); Muscle cramps; Left knee pain; Arthritis of knee; Bursitis of hip; Contusion of knee; Abnormal NCS (nerve conduction studies) (02/18/2017); Abnormal CT scan, lumbar spine (08/03/2022); Grade 1 Retrolisthesis of L1/L2 & L2/L3; Lumbosacral foraminal stenosis (Left: L5-S1); Lumbosacral radiculopathy at L5 (Left); and Chronic lower extremity pain (1ry area of Pain) (Left) on their pertinent problem list. Pain Assessment: Severity of Chronic pain is reported as a 8 /10. Location: Back Lower, Left, Right/Radaites from lower back into left leg into toes. Onset: More than a month ago. Quality: Constant, Aching, Burning, Sharp, Stabbing, Discomfort. Timing: Constant. Modifying factor(s): Denies. Vitals:  height is 6\' 1"  (1.854 m) and weight is 213 lb (96.6 kg). Her temporal temperature is 97.6 F (36.4 C). Her blood pressure is 159/95 (abnormal) and her pulse is 70. Her respiration is 18 and oxygen saturation is 100%.  BMI: Estimated body mass index is 28.1 kg/m as calculated from the following:   Height as of this encounter: 6\' 1"  (1.854 m).   Weight as of this encounter: 213 lb (96.6 kg). Last encounter: 02/21/2023. Last procedure: 03/05/2023.  Reason for encounter: post-procedure evaluation and assessment.  [name] came to [place] initially with bilateral low back pain and bilateral lower extremity pain.  We did a series of interventional therapies for her which completely eliminated them low back pain on the right side as well as the right lower extremity pain.  Today she comes back indicating that her primary area of pain is that of the left lower extremity and the pain goals all the way down into the top of her foot and big  toe, following an L5  dermatomal distribution.  Her secondary pain is now that of the left lower back around the area of the left PSIS and left L5-S1 facet junction.  She has an MRI of the lumbar spine done on 02/21/2020 and more recently a CT of the lumbar spine done on 08/03/2022, both of which indicate that she has severe L5-S1 foraminal stenosis.  She has several other degenerative changes of the lumbar spine, but the only one that seems to be currently symptomatic is that of the left L5-S1 foraminal stenosis.  She does have a history of prior back surgeries with the first one having been done around the year 2000 by Dr. Phoebe Perch, and the most recent one having been done in 2016 by Dr. Judye Bos.  A nerve conduction test of the lower extremities done on 02/18/2017 by Dr. Theora Master indicates the patient to have severe mixed acquired polyneuropathy of the lower extremities.  A left-sided lumbar facet MBB done on 05/08/2018 provided the patient with significant relief of the low back pain until approximately 01/30/2023.  A diagnostic/therapeutic left L5 and left S1 TFESI done on 02/07/2023 provided her with 100% relief of the pain for the duration of the local anesthetic but no long-term benefit.  On 03/05/2023 I did a left-sided caudal ESI which provided her with 100% relief of the pain for the first 1 hour after the procedure (likely secondary to the Versed) and only 80% benefit for the following 4 to 6 hours, from the local anesthetic.  Today the patient indicates no long-term benefit from that procedure as well.  Interpretation: Clearly the transforaminal ESI was more effective in eliminating the patient's pain for the duration of the local anesthetic, indicating that this is likely to be the etiology of the pain.  In either case the patient attained any long-term benefit from the steroids indicating that there is no inflammatory process involved in sustaining this pain and therefore it is likely to be  mechanical from the "severe" left L5-S1 foraminal stenosis described on the patient's last imaging study.  Plan: At this point, it is not likely that any further interventional therapies will be providing this patient within a long-term benefit.  However they have assisted in narrowing down the etiology of her pain to a left-sided L5-S1 foraminal stenosis.  For this reason, today I will be referring the patient to Dr. Marcell Barlow for a left-sided L5-S1 foraminotomy for the purpose of decompressing the affected area.  Post-procedure evaluation   Procedure: Caudal Epidural Steroid Injection   #3  Laterality: Midline aiming left Level: Sacrococcygeal ligament  Imaging: Fluoroscopy-guided         Anesthesia: Local anesthesia (1-2% Lidocaine) Anxiolysis: IV Versed 2.0 mg Sedation: No Sedation                       DOS: 03/05/2023  Performed by: Oswaldo Done, MD  Purpose: Diagnostic/Therapeutic Indications: Low back and lower extremity pain severe enough to impact quality of life or function. Rationale (medical necessity): procedure needed and proper for the diagnosis and/or treatment of [name] medical symptoms and needs. 1. Lumbosacral radiculopathy at L5 (Bilateral)   2. Chronic lower extremity pain (1ry area of Pain) (Bilateral) (L>R)   3. Chronic lumbar radicular pain (L5/S1 dermatome) (Bilateral) (L>R)   4. DDD (degenerative disc disease), lumbar   5. Epidural fibrosis   6. Failed back surgical syndrome (x 3)   7. Numbness and tingling of feet (Bilateral) (L>R)    History of  allergy to radiographic contrast media    Latex precautions, history of latex allergy    NAS-11 Pain score:   Pre-procedure: 10-Worst pain ever/10   Post-procedure: 2 /10      Effectiveness:  Initial hour after procedure: 100 %. Subsequent 4-6 hours post-procedure: 80 %. Analgesia past initial 6 hours: 0 %. Ongoing improvement:  Analgesic: No long-term benefit from procedure. Function: No  improvement ROM: No improvement  Pharmacotherapy Assessment  Analgesic: No chronic opioid analgesics therapy prescribed by our practice. None MME/day: 0 mg/day   Monitoring: [place] PMP: PDMP reviewed during this encounter.       Pharmacotherapy: No side-effects or adverse reactions reported. Compliance: No problems identified. Effectiveness: Clinically acceptable.  Earlyne Iba, RN  03/21/2023 12:50 PM  Sign when Signing Visit Safety precautions to be maintained throughout the outpatient stay will include: orient to surroundings, keep bed in low position, maintain call bell within reach at all times, provide assistance with transfer out of bed and ambulation.     No results found for: "CBDTHCR" No results found for: "D8THCCBX" No results found for: "D9THCCBX"  UDS:  Summary  Date Value Ref Range Status  01/30/2023 Note  Final    Comment:    ==================================================================== Compliance Drug Analysis, Ur ==================================================================== Test                             Result       Flag       Units  Drug Present and Declared for Prescription Verification   Gabapentin                     PRESENT      EXPECTED   Tizanidine                     PRESENT      EXPECTED   Duloxetine                     PRESENT      EXPECTED  Drug Present not Declared for Prescription Verification   Citalopram                     PRESENT      UNEXPECTED   Desmethylcitalopram            PRESENT      UNEXPECTED    Desmethylcitalopram is an expected metabolite of citalopram or the    enantiomeric form, escitalopram.  ==================================================================== Test                      Result    Flag   Units      Ref Range   Creatinine              79               mg/dL      >=78 ==================================================================== Declared Medications:  The flagging and interpretation on this  report are based on the  following declared medications.  Unexpected results may arise from  inaccuracies in the declared medications.   **Note: The testing scope of this panel includes these medications:   Duloxetine (Cymbalta)  Gabapentin (Neurontin)   **Note: The testing scope of this panel does not include small to  moderate amounts of these reported medications:   Tizanidine   **Note: The testing scope of this panel does not include  the  following reported medications:   Albuterol  Amlodipine  Calcium  Carvedilol (Coreg)  Cholecalciferol  Omeprazole  Potassium (Klor-Con)  Rosuvastatin  Spironolactone  Sucralfate (Carafate)  Triamcinolone (Kenalog)  Valacyclovir (Valtrex)  Zinc Oxide ==================================================================== For clinical consultation, please call [phone]. ====================================================================       ROS  Constitutional: Denies any fever or chills Gastrointestinal: No reported hemesis, hematochezia, vomiting, or acute GI distress Musculoskeletal: Denies any acute onset joint swelling, redness, loss of ROM, or weakness Neurological: No reported episodes of acute onset apraxia, aphasia, dysarthria, agnosia, amnesia, paralysis, loss of coordination, or loss of consciousness  Medication Review  DULoxetine, amLODipine, calcium carbonate, carvedilol, cholecalciferol, gabapentin, potassium chloride SA, rosuvastatin, spironolactone, and tiZANidine  History Review  Allergy: [name] is allergic to ace inhibitors, ciprofloxacin, fentanyl, hctz [hydrochlorothiazide], latex, morphine and related, penicillin g benzathine, isovue m [iopamidol], and tape. Drug: [name]  reports no history of drug use. Alcohol:  reports no history of alcohol use. Tobacco:  reports that she has never smoked. She has never used smokeless tobacco. Social: [name]  reports that she has never smoked. She  has never used smokeless tobacco. She reports that she does not drink alcohol and does not use drugs. Medical:  has a past medical history of Acute anxiety (08/03/2015), Acute bronchitis (12/31/2016), Acute pancreatitis (10/14/2016), Altered mental status (10/12/2016), Anxiety, Bronchitis, Chronic hip pain, Chronic pain syndrome, Depression, Edema, Fatigue, Fibromyalgia, GERD (gastroesophageal reflux disease), Headache, Hypertension, Insomnia, Low back pain, Right upper quadrant abdominal pain, and Vitamin D deficiency. Surgical: [name]  has a past surgical history that includes Breast surgery; Back surgery; Ectopic pregnancy surgery; Hip surgery; Abdominal hysterectomy; cyst removal from wrist; Cholecystectomy (N/A, 10/16/2016); Oophorectomy; Breast biopsy (Left, 90s); and Colonoscopy with propofol (N/A, 12/22/2021). Family: family history includes Alcohol abuse in her father; Cancer in her father; Heart disease in her mother.  Laboratory Chemistry Profile   Renal Lab Results  Component Value Date   BUN 10 03/20/2023   CREATININE 0.94 03/20/2023   BCR 14 02/05/2020   GFR 60.86 03/20/2023   GFRAA >60 07/10/2018   GFRNONAA >60 04/05/2021    Hepatic Lab Results  Component Value Date   AST 19 03/20/2023   ALT 13 03/20/2023   ALBUMIN 4.2 03/20/2023   ALKPHOS 115 03/20/2023   LIPASE 25 04/05/2021    Electrolytes Lab Results  Component Value Date   NA 136 03/20/2023   K 3.9 03/20/2023   CL 102 03/20/2023   CALCIUM 9.3 03/20/2023   MG 1.8 03/20/2023   PHOS 4.4 12/14/2020    Bone Lab Results  Component Value Date   25OHVITD1 38 01/30/2023   25OHVITD2 1.5 01/30/2023   25OHVITD3 36 01/30/2023    Inflammation (CRP: Acute Phase) (ESR: Chronic Phase) Lab Results  Component Value Date   CRP <1 01/30/2023   ESRSEDRATE 28 01/30/2023   LATICACIDVEN 1.0 10/24/2016         Note: Above Lab results reviewed.  Recent Imaging Review  DG PAIN CLINIC C-ARM 1-60 MIN NO REPORT Fluoro was  used, but no Radiologist interpretation will be provided.  Please refer to "NOTES" tab for provider progress note. Note: Reviewed        Physical Exam  General appearance: Well nourished, well developed, and well hydrated. In no apparent acute distress Mental status: Alert, oriented x 3 (person, place, & time)       Respiratory: No evidence of acute respiratory distress Eyes: PERLA Vitals: BP (!) 159/95  Pulse 70   Temp 97.6 F (36.4 C) (Temporal)   Resp 18   Ht 6\' 1"  (1.854 m)   Wt 213 lb (96.6 kg)   SpO2 100%   BMI 28.10 kg/m  BMI: Estimated body mass index is 28.1 kg/m as calculated from the following:   Height as of this encounter: 6\' 1"  (1.854 m).   Weight as of this encounter: 213 lb (96.6 kg). Ideal: Ideal body weight: 75.4 kg (166 lb 3.6 oz) Adjusted ideal body weight: 83.9 kg (184 lb 15 oz)  Assessment   Diagnosis Status  1. Lumbosacral radiculopathy at L5 (Left)   2. Lumbosacral foraminal stenosis (Left: L5-S1)   3. Chronic lower extremity pain (1ry area of Pain) (Left)   4. Chronic low back pain (Left) w/ sciatica (Left)   5. Abnormal CT scan, lumbar spine (08/03/2022)   6. Abnormal MRI, lumbar spine (02/21/2020)   7. Abnormal NCS (nerve conduction studies) (02/18/2017)   8. Failed back surgical syndrome (x 3)    Controlled Controlled Controlled   Updated Problems: Problem  Chronic lower extremity pain (1ry area of Pain) (Left)  Lumbosacral radiculopathy at L5 (Left)   This started being bilateral, but the right side has resolved.   Failed back surgical syndrome (x 3)   The first 2 surgeries were done by Dr. Phoebe Perch with the first one having been in the year 2000. The last surgery was done by Dr. Judye Bos in 2016. According to the patient her primary symptom was that of low back pain with lower extremity pain for all 3 surgeries. In all 3 cases the low back pain was the primary source of pain.   Chronic low back pain (2ry area of Pain) (Left) w/ sciatica (Left)    Acute on chronic issue. Has some decreased sensation in her left lower extremity that she reports has been present for several months. Recent MRI reviewed. She has a follow-up with her surgeon scheduled for next week. I discussed keeping this. I advised that given all the medication she has tried for pain I am unsure what to give her at this time. She is to follow-up with her surgeon for further management. Given return precautions.     Plan of Care  Problem-specific:  No problem-specific Assessment & Plan notes found for this encounter.  [name] has a current medication list which includes the following long-term medication(s): amlodipine, calcium carbonate, carvedilol, duloxetine, gabapentin, potassium chloride sa, rosuvastatin, and spironolactone.  Pharmacotherapy (Medications Ordered): No orders of the defined types were placed in this encounter.  Orders:  Orders Placed This Encounter  Procedures   Ambulatory referral to Neurosurgery    Referral Priority:   Routine    Referral Type:   Surgical    Referral Reason:   Specialty Services Required    Referred to Provider:   Venetia Night, MD    Requested Specialty:   Neurosurgery    Number of Visits Requested:   1   Follow-up plan:   Return if symptoms worsen or fail to improve.      Interventional Therapies  Risk Factors  Considerations:  ALLERGY: CONTRAST DYE  Fentanyl.  (Extremely poor historian) (poor intraprocedure compliance) (symptom exaggeration)  GERD  HTN  Anxiety  Memory impairment     Planned  Pending:   (03/21/2023) neurosurgery referral to Dr. Marcell Barlow for a left-sided L5-S1 foraminal stenosis decompression.   Under consideration:      Completed: (Extremely poor historian)  Diagnostic/therapeutic left L5 +  S1 TFESI x1 (02/07/2023) (100/100/40/0)  Therapeutic left caudal ESI x3 (03/05/2023) (100/80/0/0) Therapeutic left L2-3 LESI x2 (09/03/2017)  Therapeutic left L2 TFESI x2  (09/03/2017)  Therapeutic left RACZ procedure x1 (10/31/2017)  Diagnostic left lumbar facet MBB x1 (05/08/2018) (did not keep follow-up) (100/100/100/100 as of 01/30/2023)    Completed by other providers:   Diagnostic EMG/PNCV (lower extremity) (02/18/2017) by Theora Master, MD [facility] neurology) Dx: Severe, mixed, acquired polyneuropathy of the lower extremities.   Therapeutic  Palliative (PRN) options:   None established      Recent Visits Date Type Provider Dept  03/05/23 Procedure visit Delano Metz, MD [facility]  02/21/23 Office Visit Delano Metz, MD [facility]  02/07/23 Procedure visit Delano Metz, MD [facility]  01/30/23 Office Visit Delano Metz, MD [facility]  Showing recent visits within past 90 days and meeting all other requirements Today's Visits Date Type Provider Dept  03/21/23 Office Visit Delano Metz, MD [facility]  Showing today's visits and meeting all other requirements Future Appointments No visits were found meeting these conditions. Showing future appointments within next 90 days and meeting all other requirements  I discussed the assessment and treatment plan with the patient. The patient was provided an opportunity to ask questions and all were answered. The patient agreed with the plan and demonstrated an understanding of the instructions.  Patient advised to call back or seek an in-person evaluation if the symptoms or condition worsens.  Duration of encounter: 38 minutes.  Total time on encounter, as per AMA guidelines included both the face-to-face and non-face-to-face time personally spent by the physician and/or other qualified health care professional(s) on the day of the encounter (includes time in activities that require the physician or other qualified health care professional and does not include time in activities normally performed by clinical staff). Physician's  time may include the following activities when performed: Preparing to see the patient (e.g., pre-charting review of records, searching for previously ordered imaging, lab work, and nerve conduction tests) Review of prior analgesic pharmacotherapies. Reviewing PMP Interpreting ordered tests (e.g., lab work, imaging, nerve conduction tests) Performing post-procedure evaluations, including interpretation of diagnostic procedures Obtaining and/or reviewing separately obtained history Performing a medically appropriate examination and/or evaluation Counseling and educating the patient/family/caregiver Ordering medications, tests, or procedures Referring and communicating with other health care professionals (when not separately reported) Documenting clinical information in the electronic or other health record Independently interpreting results (not separately reported) and communicating results to the patient/ family/caregiver Care coordination (not separately reported)  Note by: Oswaldo Done, MD Date: 03/21/2023; Time: 1:38 PM

## 2023-03-21 NOTE — Progress Notes (Signed)
Safety precautions to be maintained throughout the outpatient stay will include: orient to surroundings, keep bed in low position, maintain call bell within reach at all times, provide assistance with transfer out of bed and ambulation.  

## 2023-04-04 NOTE — Progress Notes (Signed)
Referring Physician:  Delano Metz, MD [address] [facility] [address] [place],  [place] [address]  Primary Physician:  Glori Luis, MD  History of Present Illness: 04/05/2023 [name] has a history of HTN, GERD, polyneuropathy, RA, FM, chronic pain syndrome.   History of lumbar surgery x 5- two by Dr. Phoebe Perch and one was in 2000, not sure of date of other one. Third surgery was by Dr. Judye Bos in 2016.   EMG of lower extremities on 02/18/17 showed severe mixed acquired polyneuropathy of the lower extremities.   She did see relief with last surgery until last 6 months.   She has been seeing pain management Laban Emperor) and had some improvement with injections. She has persistent left leg pain that he feels is due to left L5-S1 foraminal stenosis. She is referred her to discuss possible surgery options.   6 month history of constant LBP with constant posterior left leg pain to her foot. No right leg pain. Pain is worse with standing/walking and is worse at night. She has numbness and tingling in her left leg. Occasional weakness in left leg.   Bowel/Bladder Dysfunction: none  Conservative measures:  Physical therapy: none Multimodal medical therapy including regular antiinflammatories: cymbalta, neurontin, zanaflex.   Injections:  03/05/23 left caudal ESI ( no relief ) 02/07/23 Left L5 and left S1 TF ESI 05/08/18 left lumbar facet MBB 10/31/17 Left RACZ procedure 09/03/17 left L2-L3  and left L2 TF ESI   Past Surgery:  History of lumbar surgery x 5- two by Dr. Phoebe Perch and one was in 2000, not sure of date of other one.  Third surgery was by Dr. Judye Bos in 2016.   Maria Hamilton has no symptoms of cervical myelopathy.  The symptoms are causing a significant impact on the patient's life.   Review of Systems:  A 10 point review of systems is negative, except for the pertinent positives and negatives detailed in the HPI.  Past Medical History: Past Medical  History:  Diagnosis Date   Acute anxiety 08/03/2015   Overview:  Last Assessment & Plan:  Klonopin most helpful  Reports past taking it 1 tab am 1/2 tab midday and 1 tab pm  30 day supply faxed to pharmacy   Acute bronchitis 12/31/2016   Acute pancreatitis 10/14/2016   Altered mental status 10/12/2016   Anxiety    Bronchitis    Chronic hip pain    Chronic pain syndrome    Depression    Edema    Fatigue    Fibromyalgia    GERD (gastroesophageal reflux disease)    Headache    Hypertension    Insomnia    Low back pain    Right upper quadrant abdominal pain    Vitamin D deficiency     Past Surgical History: Past Surgical History:  Procedure Laterality Date   ABDOMINAL HYSTERECTOMY     BACK SURGERY     BREAST BIOPSY Left 90s   benign   BREAST SURGERY     CHOLECYSTECTOMY N/A 10/16/2016   Procedure: LAPAROSCOPIC CHOLECYSTECTOMY WITH INTRAOPERATIVE CHOLANGIOGRAM;  Surgeon: Tiney Rouge III, MD;  Location: [facility] ORS;  Service: General;  Laterality: N/A;   COLONOSCOPY WITH PROPOFOL N/A 12/22/2021   Procedure: COLONOSCOPY WITH PROPOFOL;  Surgeon: Wyline Mood, MD;  Location: [facility] ENDOSCOPY;  Service: Gastroenterology;  Laterality: N/A;   cyst removal from wrist     ECTOPIC PREGNANCY SURGERY     HIP SURGERY     x4  OOPHORECTOMY      Allergies: Allergies as of 04/05/2023 - Review Complete 04/05/2023  Allergen Reaction Noted   Ace inhibitors Swelling 04/15/2016   Ciprofloxacin Itching and Rash 10/15/2016   Fentanyl Itching 01/09/2017   Hctz [hydrochlorothiazide]  01/12/2020   Latex  02/22/2016   Morphine and related Itching 10/23/2016   Penicillin g benzathine  08/03/2015   Isovue m [iopamidol] Itching 09/18/2017   Tape Rash 09/27/2015    Medications: Outpatient Encounter Medications as of 04/05/2023  Medication Sig   amLODipine (NORVASC) 10 MG tablet TAKE 1 TABLET EVERY DAY   calcium carbonate (OSCAL) 1500 (600 Ca) MG TABS tablet Take 600 mg of elemental calcium by mouth daily  with breakfast.   carvedilol (COREG) 12.5 MG tablet TAKE 1 TABLET TWICE DAILY WITH MEALS   cholecalciferol (VITAMIN D) 1000 units tablet Take 1,000 Units by mouth daily.   DULoxetine (CYMBALTA) 60 MG capsule Take 1 capsule (60 mg total) by mouth daily.   gabapentin (NEURONTIN) 600 MG tablet TAKE 1 TABLET AT BEDTIME   potassium chloride SA (KLOR-CON M) 20 MEQ tablet TAKE 1 TABLET EVERY DAY   rosuvastatin (CRESTOR) 20 MG tablet TAKE 1 TABLET EVERY DAY   spironolactone (ALDACTONE) 25 MG tablet TAKE 1/2 TABLET EVERY MORNING   tiZANidine (ZANAFLEX) 2 MG tablet TAKE 2 TABLETS(4 MG) BY MOUTH AT BEDTIME AS NEEDED FOR MUSCLE SPASMS   No facility-administered encounter medications on file as of 04/05/2023.    Social History: Social History   Tobacco Use   Smoking status: Never   Smokeless tobacco: Never  Vaping Use   Vaping Use: Never used  Substance Use Topics   Alcohol use: No    Alcohol/week: 0.0 standard drinks of alcohol   Drug use: No    Family Medical History: Family History  Problem Relation Age of Onset   Heart disease Mother    Cancer Father    Alcohol abuse Father     Physical Examination: Vitals:   04/05/23 0847  BP: 110/68  Pulse: 65  SpO2: 99%    General: Patient is well developed, well nourished, calm, collected, and in no apparent distress. Attention to examination is appropriate.  Respiratory: Patient is breathing without any difficulty.   NEUROLOGICAL:     Awake, alert, oriented to person, place, and time.  Speech is clear and fluent. Fund of knowledge is appropriate.   Cranial Nerves: Pupils equal round and reactive to light.  Facial tone is symmetric.    Well healed lumbar incision.  Diffuse posterior lumbar tenderness.   No abnormal lesions on exposed skin.   Strength: Side Biceps Triceps Deltoid Interossei Grip Wrist Ext. Wrist Flex.  R L Side Iliopsoas Quads Hamstring PF DF EHL  R L Reflexes are 2+ and symmetric at the biceps, triceps, brachioradialis, patella and achilles.   Hoffman's is absent.  Clonus is not present.   Bilateral upper and lower extremity sensation is intact to light touch.     Slight hunched forward limping gait.   Medical Decision Making  Imaging: CT scan of lumbar spine dated 08/03/22:  FINDINGS: Segmentation: 5 lumbar type vertebrae.   Alignment: Normal.   Vertebrae: There is no acute lumbar spine fracture. Prior anterior fusion at L3-L4 and L4-L5 and anterior and posterior fusion at L5-S1. There is bony fusion  at these levels. Previous posterior hardware removal at L3-L4. Remaining hardware at L5-S1 is intact without evidence of loosening.   Paraspinal and other soft tissues: Right renal cyst, partially visualized, appears similar to prior CT in April 2022. Prior cholecystectomy. Scattered aortoiliac atherosclerosis. Partially visualized bilateral hip arthroplasties.   Disc levels:   T12-L1: There is trace degenerative retrolisthesis with minimal disc bulging and bilateral facet arthropathy. No significant spinal canal or neural foraminal stenosis.   L1-L2: There is trace retrolisthesis with mild disc bulging, ligamentum flavum hypertrophy and mild facet arthropathy. There is no significant spinal canal stenosis. There is mild-to-moderate bilateral neural foraminal narrowing.   L2-L3: Mild disc bulging, ligament flavum hypertrophy and bilateral facet arthropathy. Moderate spinal canal stenosis, mild to moderate left and mild right neural foraminal stenosis.   L3-L4: Prior fusion with solid arthrodesis. Bony hypertrophy rolls in mild right lateral recess narrowing. No significant neural foraminal stenosis.   L4-L5: Prior fusion with solid arthrodesis. Spinal canal is decompressed. Bony hypertrophy results in mild left neural foraminal narrowing. No significant right neural foraminal narrowing.   L5-S1: Prior anterior and  posterior fusion with solid arthrodesis. Bony spurring and hypertrophy results in moderate to severe left-sided neural foraminal stenosis and contact with the exiting left L5 nerve root. Spinal canal is decompressed. No significant right neural foraminal stenosis.   IMPRESSION: No evidence of acute lumbar spine fracture.   Prior L3-S1 fusion without evidence of hardware complication. Bony hypertrophy results in mild left neural foraminal narrowing at L4-L5 and moderate-severe left-sided neural foraminal stenosis at L5-S1. Mild right lateral recess narrowing at L3-L4. Patent spinal canal at L4-L5 and L5-S1.   Multilevel degenerative changes above the fusion resulting mild-to-moderate neural foraminal stenosis at L1-L2 and L2-L3. Moderate spinal canal stenosis at L2-L3, similar to prior.     Electronically Signed   By: Caprice Renshaw M.D.   On: 08/03/2022 12:11  I have personally reviewed the images and agree with the above interpretation.  Assessment and Plan: Ms. Montavon is a pleasant 72 y.o. female has history of lumbar surgery x 5.   She did well after last surgery in 2016 until the last 6 months. Now with constant LBP with constant posterior left leg pain to her foot. No right leg pain. Pain is worse with standing/walking and is worse at night. She has numbness and tingling in her left leg. Occasional weakness in left leg.   CT from last year shows that she is fused from L3-S1. Appears she has some left foraminal stenosis at L4-L5 and L5-S1 which may be contributing to her left leg pain.   Treatment options discussed with patient and following plan made:   - MRI of lumbar spine to further evaluate lumbar radiculopathy.  - Xrays of lumbar spine with flexion/extension views. Can get same time as MRI.  - Continue current medications from other providers including cymbalta, neurontin, and zanaflex.  - Will schedule follow up with me to review MRI/xray results once I get them back.  Will review imaging with Dr. Myer Haff as well.  - We did discuss that if she is candidate for surgery that she would need to revisit lumbar PT.   I spent a total of 25 minutes in face-to-face and non-face-to-face activities related to this patient's care today including review of outside records, review of imaging, review of symptoms, physical exam, discussion of differential diagnosis, discussion of treatment options, and documentation.   Thank you for involving me in the care of this patient.  Geronimo Boot PA-C Dept. of Neurosurgery

## 2023-04-05 ENCOUNTER — Ambulatory Visit: Payer: Medicare HMO | Admitting: Orthopedic Surgery

## 2023-04-05 ENCOUNTER — Encounter: Payer: Self-pay | Admitting: Orthopedic Surgery

## 2023-04-05 VITALS — BP 110/68 | HR 65 | Ht 73.0 in | Wt 210.8 lb

## 2023-04-05 DIAGNOSIS — M5416 Radiculopathy, lumbar region: Secondary | ICD-10-CM | POA: Diagnosis not present

## 2023-04-05 DIAGNOSIS — Z981 Arthrodesis status: Secondary | ICD-10-CM | POA: Diagnosis not present

## 2023-04-05 NOTE — Patient Instructions (Signed)
It was so nice to see you today. Thank you so much for coming in.    Your previous CT scan showed fusion was healed. You may have some irritation of the nerve on the left that is causing your leg pain.   I want to get an MRI of your lower back to look into things further. We will get this approved through your insurance and [facility] will call you to schedule the appointment.   I ordered xrays of your lower back as well. You can get the same time you get the MRI or you can get these at [facility] (building with the white pillars) off of [address]. The address is [address] [place], [place] [address]. You do not need any appointment.   Once I have the results back, we will call you to schedule a visit to review them.   Please do not hesitate to call if you have any questions or concerns. You can also message me in MyChart.   If you have not heard back about the MRI in the next week, please call the office so we can help you get it  scheduled.   Drake Leach PA-C [phone]

## 2023-04-11 ENCOUNTER — Ambulatory Visit
Admission: RE | Admit: 2023-04-11 | Discharge: 2023-04-11 | Disposition: A | Discharge status: Home / Self Care | Payer: Medicare HMO | Source: Ambulatory Visit | Attending: Orthopedic Surgery | Admitting: Orthopedic Surgery

## 2023-04-11 DIAGNOSIS — Z981 Arthrodesis status: Secondary | ICD-10-CM | POA: Diagnosis not present

## 2023-04-11 DIAGNOSIS — M545 Low back pain, unspecified: Secondary | ICD-10-CM | POA: Diagnosis not present

## 2023-04-11 DIAGNOSIS — M5416 Radiculopathy, lumbar region: Secondary | ICD-10-CM

## 2023-04-11 DIAGNOSIS — R202 Paresthesia of skin: Secondary | ICD-10-CM | POA: Diagnosis not present

## 2023-04-11 DIAGNOSIS — R200 Anesthesia of skin: Secondary | ICD-10-CM | POA: Diagnosis not present

## 2023-04-24 NOTE — Progress Notes (Signed)
Referring Physician:  No referring provider defined for this encounter.  Primary Physician:  Glori Luis, MD  History of Present Illness: 04/24/2023 [name] has a history of HTN, GERD, polyneuropathy, RA, FM, chronic pain syndrome.   History of lumbar surgery x 5- two by Dr. Phoebe Perch and one was in 2000, not sure of date of other one. Third surgery was by Dr. Judye Bos in 2016.   EMG of lower extremities on 02/18/17 showed severe mixed acquired polyneuropathy of the lower extremities.   She did well after last surgery in 2016 until the last 6 months. Now with constant LBP with constant posterior left leg pain to her foot. No right leg pain. Pain is worse with standing/walking and is worse at night. She has numbness and tingling in her left leg. Occasional weakness in left leg.    CT from last year shows that she is fused from L3-S1.  She is here to review her lumbar MRI and xrays.   No change in above pain.   Bowel/Bladder Dysfunction: none  Conservative measures:  Physical therapy: none Multimodal medical therapy including regular antiinflammatories: cymbalta, neurontin, zanaflex.   Injections:  03/05/23 left caudal ESI ( no relief ) 02/07/23 Left L5 and left S1 TF ESI 05/08/18 left lumbar facet MBB 10/31/17 Left RACZ procedure 09/03/17 left L2-L3  and left L2 TF ESI   Past Surgery:  History of lumbar surgery x 5- two by Dr. Phoebe Perch and one was in 2000, not sure of date of other one.  Third surgery was by Dr. Judye Bos in 2016.   Maria Hamilton has no symptoms of cervical myelopathy.  The symptoms are causing a significant impact on the patient's life.   Review of Systems:  A 10 point review of systems is negative, except for the pertinent positives and negatives detailed in the HPI.  Past Medical History: Past Medical History:  Diagnosis Date   Acute anxiety 08/03/2015   Overview:  Last Assessment & Plan:  Klonopin most helpful  Reports past taking it 1  tab am 1/2 tab midday and 1 tab pm  30 day supply faxed to pharmacy   Acute bronchitis 12/31/2016   Acute pancreatitis 10/14/2016   Altered mental status 10/12/2016   Anxiety    Bronchitis    Chronic hip pain    Chronic pain syndrome    Depression    Edema    Fatigue    Fibromyalgia    GERD (gastroesophageal reflux disease)    Headache    Hypertension    Insomnia    Low back pain    Right upper quadrant abdominal pain    Vitamin D deficiency     Past Surgical History: Past Surgical History:  Procedure Laterality Date   ABDOMINAL HYSTERECTOMY     BACK SURGERY     BREAST BIOPSY Left 90s   benign   BREAST SURGERY     CHOLECYSTECTOMY N/A 10/16/2016   Procedure: LAPAROSCOPIC CHOLECYSTECTOMY WITH INTRAOPERATIVE CHOLANGIOGRAM;  Surgeon: Tiney Rouge III, MD;  Location: [facility] ORS;  Service: General;  Laterality: N/A;   COLONOSCOPY WITH PROPOFOL N/A 12/22/2021   Procedure: COLONOSCOPY WITH PROPOFOL;  Surgeon: Wyline Mood, MD;  Location: [facility] ENDOSCOPY;  Service: Gastroenterology;  Laterality: N/A;   cyst removal from wrist     ECTOPIC PREGNANCY SURGERY     HIP SURGERY     x4   OOPHORECTOMY      Allergies: Allergies as of 04/25/2023 - Review Complete 04/05/2023  Allergen Reaction Noted   Ace inhibitors Swelling 04/15/2016   Ciprofloxacin Itching and Rash 10/15/2016   Fentanyl Itching 01/09/2017   Hctz [hydrochlorothiazide]  01/12/2020   Latex  02/22/2016   Morphine and related Itching 10/23/2016   Penicillin g benzathine  08/03/2015   Isovue m [iopamidol] Itching 09/18/2017   Tape Rash 09/27/2015    Medications: Outpatient Encounter Medications as of 04/25/2023  Medication Sig   amLODipine (NORVASC) 10 MG tablet TAKE 1 TABLET EVERY DAY   calcium carbonate (OSCAL) 1500 (600 Ca) MG TABS tablet Take 600 mg of elemental calcium by mouth daily with breakfast.   carvedilol (COREG) 12.5 MG tablet TAKE 1 TABLET TWICE DAILY WITH MEALS   cholecalciferol (VITAMIN D) 1000 units tablet  Take 1,000 Units by mouth daily.   DULoxetine (CYMBALTA) 60 MG capsule Take 1 capsule (60 mg total) by mouth daily.   gabapentin (NEURONTIN) 600 MG tablet TAKE 1 TABLET AT BEDTIME   potassium chloride SA (KLOR-CON M) 20 MEQ tablet TAKE 1 TABLET EVERY DAY   rosuvastatin (CRESTOR) 20 MG tablet TAKE 1 TABLET EVERY DAY   spironolactone (ALDACTONE) 25 MG tablet TAKE 1/2 TABLET EVERY MORNING   tiZANidine (ZANAFLEX) 2 MG tablet TAKE 2 TABLETS(4 MG) BY MOUTH AT BEDTIME AS NEEDED FOR MUSCLE SPASMS   No facility-administered encounter medications on file as of 04/25/2023.    Social History: Social History   Tobacco Use   Smoking status: Never   Smokeless tobacco: Never  Vaping Use   Vaping Use: Never used  Substance Use Topics   Alcohol use: No    Alcohol/week: 0.0 standard drinks of alcohol   Drug use: No    Family Medical History: Family History  Problem Relation Age of Onset   Heart disease Mother    Cancer Father    Alcohol abuse Father     Physical Examination: There were no vitals filed for this visit.    Awake, alert, oriented to person, place, and time.  Speech is clear and fluent. Fund of knowledge is appropriate.   Cranial Nerves: Pupils equal round and reactive to light.  Facial tone is symmetric.    No abnormal lesions on exposed skin.   Strength:  Side Iliopsoas Quads Hamstring PF DF EHL  R 5 5 5 5 5 5   L 5 5 5 5 5 5    Bilateral lower extremity sensation is intact to light touch.     Slight hunched forward limping gait.   Medical Decision Making  Imaging: Lumbar xrays dated 04/11/23:  FINDINGS: There are five non-rib bearing lumbar-type vertebral bodies. Status post posterior fixation with intervertebral spacer placement of L5-S1 as well as intervertebral spacer placement of L3-4 and L4-5. Osseous ankylosis spanning L3-S1. No evidence of dynamic instability on limited excursion on flexion and extension views. Orthopedic hardware is intact and without  periprosthetic fracture or lucency. No spondylolisthesis. Mild levocurvature of the upper lumbar spine. There is no evidence for acute fracture or subluxation. Mild intervertebral disc space height loss with endplate proliferative changes at L2-3, L1-2 and T12-L1. Facet arthropathy. Status post bilateral hip arthroplasty. Moderate colonic stool burden. Atherosclerotic calcifications.   IMPRESSION: Status post posterior fixation of L5-S1 with osseous ankylosis spanning from L3-S1 at sites of intervertebral spacer placement. No evidence of dynamic instability.     Electronically Signed   By: Meda Klinefelter M.D.   On: 04/16/2023 09:39   Lumbar MRI dated 04/11/23:  FINDINGS: Segmentation: Conventional anatomy assumed, with the last open disc space  designated L5-S1.Concordant with the imaging.   Alignment:  Physiologic.   Vertebrae: No worrisome osseous lesion, acute fracture or pars defect. Solid interbody fusion post L3-5 posterior and interbody fusion. There are bilateral pedicle screws and rods at L5-S1 with posterior fusion at that level on previous CT. There is no endplate edema at W0-J8 to suggest abnormal motion. Mild sacroiliac degenerative changes bilaterally.   Conus medullaris: Extends to the T12-L1 level and appears normal.   Paraspinal and other soft tissues: No significant paraspinal findings. Grossly stable right renal cyst for which no follow-up imaging is recommended.   Disc levels:   T12-L1: Mildly progressive loss of disc height with annular disc bulging. No significant spinal stenosis or nerve root encroachment.   L1-2: Mildly progressive disc bulging with mild facet and ligamentous hypertrophy. No significant spinal stenosis or nerve root encroachment.   L2-3: Stable disc bulging with moderate facet and ligamentous hypertrophy. Disc height is relatively preserved. There is stable mild spinal stenosis and mild lateral recess narrowing  bilaterally. The foramina remain sufficiently patent.   L3-4: The spinal canal and neural foramina remain patent post posterior and interbody fusion.   L4-5: The spinal canal and neural foramina remain patent post posterior and interbody fusion.   L5-S1: Stable postsurgical changes from previous PLIF. As before, definite interbody fusion not demonstrated. There is solid posterior fusion as correlated with previous CT. Stable left foraminal narrowing due to spur. The spinal canal and right foramen are patent.   IMPRESSION: 1. Compared with previous MRI from 02/20/2020, no acute findings or clear explanation for the patient's symptoms. 2. Stable postsurgical changes status post L3 through S1 fusion. Stable moderate chronic left foraminal narrowing at L5-S1 due to spur. 3. Stable mild multifactorial spinal stenosis and lateral recess narrowing bilaterally at L2-3. 4. Mildly progressive disc bulging at T12-L1 and L1-2 without resulting spinal stenosis or nerve root encroachment.     Electronically Signed   By: Carey Bullocks M.D.   On: 04/16/2023 13:05   I have personally reviewed the images and agree with the above interpretation.  Above imaging also reviewed with Dr. Myer Haff prior to her visit.   Assessment and Plan: Ms. Devaul is a pleasant 72 y.o. female has history of lumbar surgery x 5.   She did well after last surgery in 2016 until the last 6 months. Now with constant LBP with constant posterior left leg pain to her foot. No right leg pain. Pain is worse with standing/walking and is worse at night. She has numbness and tingling in her left leg. Occasional weakness in left leg.   Lumbar xrays show no instability. Previous CT showed she is fused from L3-S1. Above MRI shows some left foraminal stenosis at L5-S1.   Treatment options discussed with patient and following plan made:   - Dr. Myer Haff does not think there is enough stenosis to warrant decompression.  -  He recommends consideration of SCS/PNS or medication management. Patient is in agreement.  - Message to Dr. Laban Emperor regarding above plan.  - She will f/u with me prn.   I spent a total of 15 minutes in face-to-face and non-face-to-face activities related to this patient's care today including review of outside records, review of imaging, review of symptoms, physical exam, discussion of differential diagnosis, discussion of treatment options, and documentation.   Drake Leach PA-C Dept. of Neurosurgery

## 2023-04-25 ENCOUNTER — Encounter: Payer: Self-pay | Admitting: Orthopedic Surgery

## 2023-04-25 ENCOUNTER — Ambulatory Visit (INDEPENDENT_AMBULATORY_CARE_PROVIDER_SITE_OTHER): Payer: Medicare HMO | Admitting: Orthopedic Surgery

## 2023-04-25 VITALS — BP 140/78 | Ht 73.0 in | Wt 212.0 lb

## 2023-04-25 DIAGNOSIS — Z981 Arthrodesis status: Secondary | ICD-10-CM | POA: Diagnosis not present

## 2023-04-25 DIAGNOSIS — M5416 Radiculopathy, lumbar region: Secondary | ICD-10-CM | POA: Diagnosis not present

## 2023-04-25 NOTE — Patient Instructions (Signed)
It was so nice to see you today. Thank you so much for coming in.    Dr. Myer Haff does not recommend any additional surgery for your back.   He wants you to discuss a spinal cord stimulator with pain management. I will send Dr. Laban Emperor a note about this.   Please do not hesitate to call if you have any questions or concerns. You can also message me in MyChart.   Drake Leach PA-C [phone]

## 2023-04-29 ENCOUNTER — Other Ambulatory Visit: Payer: Self-pay | Admitting: Pain Medicine

## 2023-04-29 NOTE — Progress Notes (Unsigned)
Recently seen by Neurosurgeon who recommended SCS evaluation.

## 2023-04-30 ENCOUNTER — Ambulatory Visit: Payer: Medicare HMO | Admitting: Orthopedic Surgery

## 2023-05-20 ENCOUNTER — Telehealth: Payer: Self-pay | Admitting: Orthopedic Surgery

## 2023-05-20 NOTE — Telephone Encounter (Signed)
Patient is calling that she is ready to cut her leg off. She feels she is being thrown from one provider to the next. No one has contacted her for an appt with the pain clinic. Should she try to find a new doctor? She is upset.

## 2023-05-20 NOTE — Telephone Encounter (Signed)
Crystal from Dr.Naveira's office just called. She will print out your office note and give it to Dr.Naviera.

## 2023-05-20 NOTE — Telephone Encounter (Signed)
FYI-Appt 05/27/2023

## 2023-05-20 NOTE — Telephone Encounter (Signed)
I am sorry that she is hurting so much. Looks like Dr. Laban Emperor should be getting back to her with a further plan.   I recommend that she call Dr. Waynetta Sandy office back and discuss further with them. I would stay with him, but if she wants to look for another pain doctor then that is up to her.

## 2023-05-26 NOTE — Progress Notes (Unsigned)
PROVIDER NOTE: Information contained herein reflects review and annotations entered in association with encounter. Interpretation of such information and data should be left to medically-trained personnel. Information provided to patient can be located elsewhere in the medical record under "Patient Instructions". Document created using STT-dictation technology, any transcriptional errors that may result from process are unintentional.    [name]  Service Category: E/M  Provider: Oswaldo Done, MD  DOB: 04/14/1951  DOS: 05/27/2023  Referring Provider: Glori Luis, MD  MRN: [id number]  Specialty: Interventional Pain Management  PCP: Glori Luis, MD  Type: Established Patient  Setting: Ambulatory outpatient    Location: Office  Delivery: Face-to-face     HPI  Ms. NACONA DEEL, a 72 y.o. year old female, is here today because of her No primary diagnosis found.. Ms. Lifsey primary complain today is No chief complaint on file.  Pertinent problems: Ms. Cissel has Rheumatoid factor positive; Fibromyalgia; Chronic low back pain (2ry area of Pain) (Left) w/ sciatica (Left); Chronic pain syndrome; 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); Rheumatoid arthritis, multiple sites (positive RF); Chronic lower extremity pain (1ry area of Pain) (Bilateral) (L>R); Lumbar facet arthropathy (Bilateral); Failed back surgical syndrome (x 3); Epidural fibrosis; Neurogenic pain; Musculoskeletal pain; Chronic lumbar radicular pain (L5/S1 dermatome) (Bilateral) (L>R); History of total hip replacement, bilateral; Polyneuropathy; Post herpetic neuralgia; Abnormal MRI, lumbar spine (02/21/2020); 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); History of lumbar fusion (L3-4, L4-5, and L5-S1); Chronic foot numbness (Left); Ankle pain; DDD (degenerative disc disease), lumbar; Osteoarthritis of hip; Spondylosis without myelopathy or radiculopathy, lumbosacral  region; Chronic hip pain after total replacement of hip joint (Left); Chronic hip pain after total replacement of hip joint (Right); Chronic pain of hip  (Bilateral) (L>R); Chronic sacroiliac joint pain (Bilateral) (L>R); Lumbar facet syndrome (Bilateral) (L>R); Numbness and tingling of feet (Bilateral) (L>R); Muscle cramps; Left knee pain; Arthritis of knee; Bursitis of hip; Contusion of knee; Abnormal NCS (nerve conduction studies) (02/18/2017); Abnormal CT scan, lumbar spine (08/03/2022); Grade 1 Retrolisthesis of L1/L2 & L2/L3; Lumbosacral foraminal stenosis (Left: L5-S1); Lumbosacral radiculopathy at L5 (Left); and Chronic lower extremity pain (1ry area of Pain) (Left) on their pertinent problem list. Pain Assessment: Severity of   is reported as a  /10. Location:    / . Onset:  . Quality:  . Timing:  . Modifying factor(s):  Marland Kitchen Vitals:  vitals were not taken for this visit.  BMI: Estimated body mass index is 27.97 kg/m as calculated from the following:   Height as of 04/25/23: 6\' 1"  (1.854 m).   Weight as of 04/25/23: 212 lb (96.2 kg). Last encounter: 03/21/2023. Last procedure: 03/05/2023.  Reason for encounter:  *** . ***  Pharmacotherapy Assessment  Analgesic: No chronic opioid analgesics therapy prescribed by our practice. None MME/day: 0 mg/day   Monitoring: [place] PMP: PDMP reviewed during this encounter.       Pharmacotherapy: No side-effects or adverse reactions reported. Compliance: No problems identified. Effectiveness: Clinically acceptable.  No notes on file  No results found for: "CBDTHCR" No results found for: "D8THCCBX" No results found for: "D9THCCBX"  UDS:  Summary  Date Value Ref Range Status  01/30/2023 Note  Final    Comment:    ==================================================================== Compliance Drug Analysis, Ur ==================================================================== Test                             Result  Flag       Units  Drug Present  and Declared for Prescription Verification   Gabapentin                     PRESENT      EXPECTED   Tizanidine                     PRESENT      EXPECTED   Duloxetine                     PRESENT      EXPECTED  Drug Present not Declared for Prescription Verification   Citalopram                     PRESENT      UNEXPECTED   Desmethylcitalopram            PRESENT      UNEXPECTED    Desmethylcitalopram is an expected metabolite of citalopram or the    enantiomeric form, escitalopram.  ==================================================================== Test                      Result    Flag   Units      Ref Range   Creatinine              79               mg/dL      >=82 ==================================================================== Declared Medications:  The flagging and interpretation on this report are based on the  following declared medications.  Unexpected results may arise from  inaccuracies in the declared medications.   **Note: The testing scope of this panel includes these medications:   Duloxetine (Cymbalta)  Gabapentin (Neurontin)   **Note: The testing scope of this panel does not include small to  moderate amounts of these reported medications:   Tizanidine   **Note: The testing scope of this panel does not include the  following reported medications:   Albuterol  Amlodipine  Calcium  Carvedilol (Coreg)  Cholecalciferol  Omeprazole  Potassium (Klor-Con)  Rosuvastatin  Spironolactone  Sucralfate (Carafate)  Triamcinolone (Kenalog)  Valacyclovir (Valtrex)  Zinc Oxide ==================================================================== For clinical consultation, please call [phone]. ====================================================================       ROS  Constitutional: Denies any fever or chills Gastrointestinal: No reported hemesis, hematochezia, vomiting, or acute GI distress Musculoskeletal: Denies any acute onset joint swelling,  redness, loss of ROM, or weakness Neurological: No reported episodes of acute onset apraxia, aphasia, dysarthria, agnosia, amnesia, paralysis, loss of coordination, or loss of consciousness  Medication Review  DULoxetine, amLODipine, calcium carbonate, carvedilol, cholecalciferol, gabapentin, potassium chloride SA, rosuvastatin, spironolactone, and tiZANidine  History Review  Allergy: Ms. Diguglielmo is allergic to ace inhibitors, ciprofloxacin, fentanyl, hctz [hydrochlorothiazide], latex, morphine and codeine, penicillin g benzathine, isovue m [iopamidol], and tape. Drug: Ms. Frueh  reports no history of drug use. Alcohol:  reports no history of alcohol use. Tobacco:  reports that she has never smoked. She has never used smokeless tobacco. Social: Ms. Ferring  reports that she has never smoked. She has never used smokeless tobacco. She reports that she does not drink alcohol and does not use drugs. Medical:  has a past medical history of Acute anxiety (08/03/2015), Acute bronchitis (12/31/2016), Acute pancreatitis (10/14/2016), Altered mental status (10/12/2016), Anxiety, Bronchitis, Chronic hip pain, Chronic pain syndrome, Depression, Edema, Fatigue, Fibromyalgia, GERD (gastroesophageal reflux disease), Headache, Hypertension, Insomnia, Low  back pain, Right upper quadrant abdominal pain, and Vitamin D deficiency. Surgical: Ms. Arizola  has a past surgical history that includes Breast surgery; Back surgery; Ectopic pregnancy surgery; Hip surgery; Abdominal hysterectomy; cyst removal from wrist; Cholecystectomy (N/A, 10/16/2016); Oophorectomy; Breast biopsy (Left, 90s); and Colonoscopy with propofol (N/A, 12/22/2021). Family: family history includes Alcohol abuse in her father; Cancer in her father; Heart disease in her mother.  Laboratory Chemistry Profile   Renal Lab Results  Component Value Date   BUN 10 03/20/2023   CREATININE 0.94 03/20/2023   BCR 14 02/05/2020   GFR 60.86 03/20/2023    GFRAA >60 07/10/2018   GFRNONAA >60 04/05/2021    Hepatic Lab Results  Component Value Date   AST 19 03/20/2023   ALT 13 03/20/2023   ALBUMIN 4.2 03/20/2023   ALKPHOS 115 03/20/2023   LIPASE 25 04/05/2021    Electrolytes Lab Results  Component Value Date   NA 136 03/20/2023   K 3.9 03/20/2023   CL 102 03/20/2023   CALCIUM 9.3 03/20/2023   MG 1.8 03/20/2023   PHOS 4.4 12/14/2020    Bone Lab Results  Component Value Date   25OHVITD1 38 01/30/2023   25OHVITD2 1.5 01/30/2023   25OHVITD3 36 01/30/2023    Inflammation (CRP: Acute Phase) (ESR: Chronic Phase) Lab Results  Component Value Date   CRP <1 01/30/2023   ESRSEDRATE 28 01/30/2023   LATICACIDVEN 1.0 10/24/2016         Note: Above Lab results reviewed.  Recent Imaging Review  MR LUMBAR SPINE WO CONTRAST CLINICAL DATA:  Low back pain with left leg numbness, burning and tingling. Multiple previous back surgeries.  EXAM: MRI LUMBAR SPINE WITHOUT CONTRAST  TECHNIQUE: Multiplanar, multisequence MR imaging of the lumbar spine was performed. No intravenous contrast was administered.  COMPARISON:  Lumbar MRI 02/20/2020. Lumbar spine CT 08/03/2022 and radiographs 04/11/2023.  FINDINGS: Segmentation: Conventional anatomy assumed, with the last open disc space designated L5-S1.Concordant with the imaging.  Alignment:  Physiologic.  Vertebrae: No worrisome osseous lesion, acute fracture or pars defect. Solid interbody fusion post L3-5 posterior and interbody fusion. There are bilateral pedicle screws and rods at L5-S1 with posterior fusion at that level on previous CT. There is no endplate edema at Z6-X0 to suggest abnormal motion. Mild sacroiliac degenerative changes bilaterally.  Conus medullaris: Extends to the T12-L1 level and appears normal.  Paraspinal and other soft tissues: No significant paraspinal findings. Grossly stable right renal cyst for which no follow-up imaging is recommended.  Disc  levels:  T12-L1: Mildly progressive loss of disc height with annular disc bulging. No significant spinal stenosis or nerve root encroachment.  L1-2: Mildly progressive disc bulging with mild facet and ligamentous hypertrophy. No significant spinal stenosis or nerve root encroachment.  L2-3: Stable disc bulging with moderate facet and ligamentous hypertrophy. Disc height is relatively preserved. There is stable mild spinal stenosis and mild lateral recess narrowing bilaterally. The foramina remain sufficiently patent.  L3-4: The spinal canal and neural foramina remain patent post posterior and interbody fusion.  L4-5: The spinal canal and neural foramina remain patent post posterior and interbody fusion.  L5-S1: Stable postsurgical changes from previous PLIF. As before, definite interbody fusion not demonstrated. There is solid posterior fusion as correlated with previous CT. Stable left foraminal narrowing due to spur. The spinal canal and right foramen are patent.  IMPRESSION: 1. Compared with previous MRI from 02/20/2020, no acute findings or clear explanation for the patient's symptoms. 2. Stable postsurgical changes status  post L3 through S1 fusion. Stable moderate chronic left foraminal narrowing at L5-S1 due to spur. 3. Stable mild multifactorial spinal stenosis and lateral recess narrowing bilaterally at L2-3. 4. Mildly progressive disc bulging at T12-L1 and L1-2 without resulting spinal stenosis or nerve root encroachment.  Electronically Signed   By: Carey Bullocks M.D.   On: 04/16/2023 13:05 DG Lumbar Spine Complete CLINICAL DATA:  LBP  EXAM: LUMBAR SPINE - COMPLETE 4+ VIEW  COMPARISON:  July 20, 2016  FINDINGS: There are five non-rib bearing lumbar-type vertebral bodies. Status post posterior fixation with intervertebral spacer placement of L5-S1 as well as intervertebral spacer placement of L3-4 and L4-5. Osseous ankylosis spanning L3-S1. No evidence  of dynamic instability on limited excursion on flexion and extension views. Orthopedic hardware is intact and without periprosthetic fracture or lucency. No spondylolisthesis. Mild levocurvature of the upper lumbar spine. There is no evidence for acute fracture or subluxation. Mild intervertebral disc space height loss with endplate proliferative changes at L2-3, L1-2 and T12-L1. Facet arthropathy. Status post bilateral hip arthroplasty. Moderate colonic stool burden. Atherosclerotic calcifications.  IMPRESSION: Status post posterior fixation of L5-S1 with osseous ankylosis spanning from L3-S1 at sites of intervertebral spacer placement. No evidence of dynamic instability.  Electronically Signed   By: Meda Klinefelter M.D.   On: 04/16/2023 09:39 Note: Reviewed        Physical Exam  General appearance: Well nourished, well developed, and well hydrated. In no apparent acute distress Mental status: Alert, oriented x 3 (person, place, & time)       Respiratory: No evidence of acute respiratory distress Eyes: PERLA Vitals: There were no vitals taken for this visit. BMI: Estimated body mass index is 27.97 kg/m as calculated from the following:   Height as of 04/25/23: 6\' 1"  (1.854 m).   Weight as of 04/25/23: 212 lb (96.2 kg). Ideal: Patient weight not recorded  Assessment   Diagnosis Status  No diagnosis found. Controlled Controlled Controlled   Updated Problems: No problems updated.  Plan of Care  Problem-specific:  No problem-specific Assessment & Plan notes found for this encounter.  Ms. TERRIANNA KLUK has a current medication list which includes the following long-term medication(s): amlodipine, calcium carbonate, carvedilol, duloxetine, gabapentin, potassium chloride sa, rosuvastatin, and spironolactone.  Pharmacotherapy (Medications Ordered): No orders of the defined types were placed in this encounter.  Orders:  No orders of the defined types were placed in  this encounter.  Follow-up plan:   No follow-ups on file.      Interventional Therapies  Risk Factors  Considerations:  ALLERGY: CONTRAST DYE  Fentanyl.  (Extremely poor historian) (poor intraprocedure compliance) (symptom exaggeration)  GERD  HTN  Anxiety  Memory impairment     Planned  Pending:   (03/21/2023) neurosurgery referral to Dr. Marcell Barlow for a left-sided L5-S1 foraminal stenosis decompression.   Under consideration:      Completed: (Extremely poor historian)  Diagnostic/therapeutic left L5 + S1 TFESI x1 (02/07/2023) (100/100/40/0)  Therapeutic left caudal ESI x3 (03/05/2023) (100/80/0/0) Therapeutic left L2-3 LESI x2 (09/03/2017)  Therapeutic left L2 TFESI x2 (09/03/2017)  Therapeutic left RACZ procedure x1 (10/31/2017)  Diagnostic left lumbar facet MBB x1 (05/08/2018) (did not keep follow-up) (100/100/100/100 as of 01/30/2023)    Completed by other providers:   Diagnostic EMG/PNCV (lower extremity) (02/18/2017) by Theora Master, MD [facility] neurology) Dx: Severe, mixed, acquired polyneuropathy of the lower extremities.   Therapeutic  Palliative (PRN) options:   None established  Recent Visits Date Type Provider Dept  03/21/23 Office Visit Delano Metz, MD [facility]  03/05/23 Procedure visit Delano Metz, MD [facility]  Showing recent visits within past 90 days and meeting all other requirements Future Appointments Date Type Provider Dept  05/27/23 Appointment Delano Metz, MD [facility]  Showing future appointments within next 90 days and meeting all other requirements  I discussed the assessment and treatment plan with the patient. The patient was provided an opportunity to ask questions and all were answered. The patient agreed with the plan and demonstrated an understanding of the instructions.  Patient advised to call back or seek an in-person evaluation if the symptoms or condition  worsens.  Duration of encounter: *** minutes.  Total time on encounter, as per AMA guidelines included both the face-to-face and non-face-to-face time personally spent by the physician and/or other qualified health care professional(s) on the day of the encounter (includes time in activities that require the physician or other qualified health care professional and does not include time in activities normally performed by clinical staff). Physician's time may include the following activities when performed: Preparing to see the patient (e.g., pre-charting review of records, searching for previously ordered imaging, lab work, and nerve conduction tests) Review of prior analgesic pharmacotherapies. Reviewing PMP Interpreting ordered tests (e.g., lab work, imaging, nerve conduction tests) Performing post-procedure evaluations, including interpretation of diagnostic procedures Obtaining and/or reviewing separately obtained history Performing a medically appropriate examination and/or evaluation Counseling and educating the patient/family/caregiver Ordering medications, tests, or procedures Referring and communicating with other health care professionals (when not separately reported) Documenting clinical information in the electronic or other health record Independently interpreting results (not separately reported) and communicating results to the patient/ family/caregiver Care coordination (not separately reported)  Note by: Oswaldo Done, MD Date: 05/27/2023; Time: 6:43 PM

## 2023-05-27 ENCOUNTER — Ambulatory Visit
Admission: RE | Admit: 2023-05-27 | Discharge: 2023-05-27 | Disposition: A | Discharge status: Home / Self Care | Payer: Medicare HMO | Source: Ambulatory Visit | Attending: Pain Medicine | Admitting: Pain Medicine

## 2023-05-27 ENCOUNTER — Ambulatory Visit: Payer: Medicare HMO | Admitting: Pain Medicine

## 2023-05-27 ENCOUNTER — Encounter: Payer: Self-pay | Admitting: Pain Medicine

## 2023-05-27 VITALS — BP 134/89 | HR 55 | Temp 97.0°F | Resp 16 | Ht 73.0 in | Wt 211.0 lb

## 2023-05-27 DIAGNOSIS — Z96642 Presence of left artificial hip joint: Secondary | ICD-10-CM | POA: Insufficient documentation

## 2023-05-27 DIAGNOSIS — R0989 Other specified symptoms and signs involving the circulatory and respiratory systems: Secondary | ICD-10-CM | POA: Insufficient documentation

## 2023-05-27 DIAGNOSIS — I824Z2 Acute embolism and thrombosis of unspecified deep veins of left distal lower extremity: Secondary | ICD-10-CM | POA: Insufficient documentation

## 2023-05-27 DIAGNOSIS — M961 Postlaminectomy syndrome, not elsewhere classified: Secondary | ICD-10-CM | POA: Insufficient documentation

## 2023-05-27 DIAGNOSIS — M79604 Pain in right leg: Secondary | ICD-10-CM

## 2023-05-27 DIAGNOSIS — M4316 Spondylolisthesis, lumbar region: Secondary | ICD-10-CM | POA: Diagnosis not present

## 2023-05-27 DIAGNOSIS — M79605 Pain in left leg: Secondary | ICD-10-CM | POA: Insufficient documentation

## 2023-05-27 DIAGNOSIS — M25552 Pain in left hip: Secondary | ICD-10-CM

## 2023-05-27 DIAGNOSIS — M5442 Lumbago with sciatica, left side: Secondary | ICD-10-CM

## 2023-05-27 DIAGNOSIS — G8929 Other chronic pain: Secondary | ICD-10-CM | POA: Insufficient documentation

## 2023-05-27 DIAGNOSIS — M4310 Spondylolisthesis, site unspecified: Secondary | ICD-10-CM

## 2023-05-27 DIAGNOSIS — G96198 Other disorders of meninges, not elsewhere classified: Secondary | ICD-10-CM

## 2023-05-27 DIAGNOSIS — M25551 Pain in right hip: Secondary | ICD-10-CM | POA: Insufficient documentation

## 2023-05-27 DIAGNOSIS — Z96641 Presence of right artificial hip joint: Secondary | ICD-10-CM

## 2023-05-27 DIAGNOSIS — M7989 Other specified soft tissue disorders: Secondary | ICD-10-CM | POA: Diagnosis not present

## 2023-05-27 NOTE — Patient Instructions (Signed)

## 2023-06-10 ENCOUNTER — Telehealth: Payer: Self-pay | Admitting: Family Medicine

## 2023-06-10 DIAGNOSIS — E538 Deficiency of other specified B group vitamins: Secondary | ICD-10-CM

## 2023-06-10 NOTE — Telephone Encounter (Signed)
Patient needs orders

## 2023-06-10 NOTE — Telephone Encounter (Signed)
Ordered

## 2023-06-13 DIAGNOSIS — F4542 Pain disorder with related psychological factors: Secondary | ICD-10-CM | POA: Diagnosis not present

## 2023-06-14 ENCOUNTER — Telehealth: Payer: Self-pay | Admitting: Family Medicine

## 2023-06-14 NOTE — Telephone Encounter (Signed)
There is already a B12 order in the system that is a future test. This was ordered 06/10/23. This is all she needs.

## 2023-06-14 NOTE — Telephone Encounter (Signed)
Patient need orders  °

## 2023-06-18 ENCOUNTER — Other Ambulatory Visit (INDEPENDENT_AMBULATORY_CARE_PROVIDER_SITE_OTHER): Payer: Medicare HMO

## 2023-06-18 DIAGNOSIS — E538 Deficiency of other specified B group vitamins: Secondary | ICD-10-CM | POA: Diagnosis not present

## 2023-06-18 LAB — VITAMIN B12: Vitamin B-12: 361 pg/mL (ref 211–911)

## 2023-07-16 ENCOUNTER — Other Ambulatory Visit: Payer: Self-pay | Admitting: Family Medicine

## 2023-07-16 DIAGNOSIS — M62838 Other muscle spasm: Secondary | ICD-10-CM

## 2023-07-22 NOTE — Progress Notes (Signed)
PROVIDER NOTE: Interpretation of information contained herein should be left to medically-trained personnel. Specific patient instructions are provided elsewhere under "Patient Instructions" section of medical record. This document was created in part using STT-dictation technology, any transcriptional errors that may result from this process are unintentional.  [name] Type: Established DOB: Jan 14, 1951 MRN: [id number] PCP: Glori Luis, MD  Service: Procedure DOS: 07/23/2023 Setting: Ambulatory Location: Ambulatory outpatient facility Delivery: Face-to-face Provider: Oswaldo Done, MD Specialty: Interventional Pain Management Specialty designation: 09 Location: Outpatient facility Ref. Prov.: Delano Metz, MD       Interventional Therapy   Primary Reason for Admission: Surgical management of chronic pain condition.   Procedure:              Type: Trial Spinal Cord Neurostimulator Implant (Percutaneous, interlaminar, posterior epidural placement) Laterality: Bilateral (-50)  Level: Lumbar  Imaging: Fluoroscopic guidance Anesthesia: Local anesthesia (1-2% Lidocaine) Anxiolysis: IV Versed 3.0 mg Sedation: Moderate Sedation Fentanyl 2.0 mL (100 mcg) DOS: 07/23/2023  Performed by: Oswaldo Done, MD  Purpose: Diagnostic. To determine if a permanent implant may be effective in controlling some or all of Ms. Spizzirri chronic pain symptoms.  Indications: Low back pain and lower extremity pain severe enough to impact quality of life or function. Rationale (medical necessity): procedure needed and proper for the diagnosis and/or treatment of Ms. Barkdoll medical symptoms and needs. 1. Chronic lower extremity pain (1ry area of Pain) (Bilateral) (L>R)   2. Chronic low back pain (2ry area of Pain) (Left) w/ sciatica (Left)   3. Chronic lumbar radicular pain (L5/S1 dermatome) (Bilateral) (L>R)   4. DDD (degenerative disc disease), lumbar   5. Failed  back surgical syndrome (x 3)   6. Grade 1 Retrolisthesis of L1/L2 & L2/L3   7. 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)   8. Lumbosacral foraminal stenosis (Left: L5-S1)   9. Lumbosacral radiculopathy at L5 (Left)   10. Numbness and tingling of feet (Bilateral) (L>R)   11. History of lumbar fusion (L3-4, L4-5, and L5-S1)    History of total hip replacement, bilateral    History of allergy to radiographic contrast media    Latex precautions, history of latex allergy    NAS-11 Pain score:   Pre-procedure: 8 /10   Post-procedure: 0-No pain/10     Target: Posterior epidural space over the dorsal columns of the spinal cord. Location: Posterior intraspinal canal Region: Thoracolumbar  Approach: Translaminar percutaneous  Type of procedure: Surgical   Position / Prep / Materials:  Position: Prone  Prep solution: DuraPrep (Iodine Povacrylex [0.7% available iodine] and Isopropyl Alcohol, 74% w/w) Prep Area: Entire  Posterior  Thoracolumbar  Region  Materials:  Tray: Implant tray Needle(s):  Type: Epidural  Gauge (G): 17  Length: 3.5-in  Qty: 1  H&P (Pre-op Assessment):  Ms. Vanzee is a 72 y.o. (year old), female patient, seen today for interventional treatment. She  has a past surgical history that includes Breast surgery; Back surgery; Ectopic pregnancy surgery; Hip surgery; Abdominal hysterectomy; cyst removal from wrist; Cholecystectomy (N/A, 10/16/2016); Oophorectomy; Breast biopsy (Left, 90s); and Colonoscopy with propofol (N/A, 12/22/2021).  Initial Vital Signs:  Pulse/EKG Rate: 67ECG Heart Rate: 65 Temp: 98.1 F (36.7 C) Resp: 16 BP: 125/80 SpO2: 100 %  BMI: Estimated body mass index is 27.84 kg/m as calculated from the following:   Height as of this encounter: 6\' 1"  (1.854 m).   Weight as of this encounter: 211 lb (95.7 kg).  Risk Assessment:  Allergies: Reviewed. She is allergic to ace inhibitors, ciprofloxacin, fentanyl, hctz [hydrochlorothiazide],  latex, morphine and codeine, penicillin g benzathine, isovue m [iopamidol], and tape.  Allergy Precautions: None required Coagulopathies: Reviewed. None identified.  Blood-thinner therapy: None at this time Active Infection(s): Reviewed. None identified. Ms. Hardwicke is afebrile  Site Confirmation: Ms. Osorno was asked to confirm the procedure and laterality before marking the site, which she did. Procedure checklist: Completed Consent: Before the procedure and under the influence of no sedative(s), amnesic(s), or anxiolytics, the patient was informed of the treatment options, risks and possible complications. To fulfill our ethical and legal obligations, as recommended by the American Medical Association's Code of Ethics, I have informed the patient of my clinical impression; the nature and purpose of the treatment or procedure; the risks, benefits, and possible complications of the intervention; the alternatives, including doing nothing; the risk(s) and benefit(s) of the alternative treatment(s) or procedure(s); and the risk(s) and benefit(s) of doing nothing.  Ms. Toguchi was provided with information about the general risks and possible complications associated with most interventional procedures. These include, but are not limited to: failure to achieve desired goals, infection, bleeding, organ or nerve damage, allergic reactions, paralysis, and/or death.  In addition, she was informed of those risks and possible complications associated to this particular procedure, which include, but are not limited to: damage to the implant; failure to decrease pain; local, systemic, or serious CNS infections, intraspinal abscess with possible cord compression and paralysis, or life-threatening such as meningitis; intrathecal and/or epidural bleeding with formation of hematoma with possible spinal cord compression and permanent paralysis; organ damage; nerve injury or damage with subsequent sensory, motor,  and/or autonomic system dysfunction, resulting in transient or permanent pain, numbness, and/or weakness of one or several areas of the body; allergic reactions, either minor or major life-threatening, such as anaphylactic or anaphylactoid reactions.  Furthermore, Ms. Woodliff was informed of those risks and complications associated with the medications. These include, but are not limited to: allergic reactions (i.e.: anaphylactic or anaphylactoid reactions); arrhythmia;  Hypotension/hypertension; cardiovascular collapse; respiratory depression and/or shortness of breath; swelling or edema; medication-induced neural toxicity; particulate matter embolism and blood vessel occlusion with resultant organ, and/or nervous system infarction and permanent paralysis.  Finally, she was informed that Medicine is not an exact science; therefore, there is also the possibility of unforeseen or unpredictable risks and/or possible complications that may result in a catastrophic outcome. The patient indicated having understood very clearly. We have given the patient no guarantees and we have made no promises. Enough time was given to the patient to ask questions, all of which were answered to the patient's satisfaction. Ms. Reo has indicated that she wanted to continue with the procedure. Attestation: I, the ordering provider, attest that I have discussed with the patient the benefits, risks, side-effects, alternatives, likelihood of achieving goals, and potential problems during recovery for the procedure that I have provided informed consent. Date  Time: 07/23/2023  7:52 AM  Pre-Procedure Preparation:  Monitoring: As per clinic protocol. Respiration, ETCO2, SpO2, BP, heart rate and rhythm monitor placed and checked for adequate function Safety Precautions: Patient was assessed for positional comfort and pressure points before starting the procedure. Time-out: I initiated and conducted the "Time-out" before starting  the procedure, as per protocol. The patient was asked to participate by confirming the accuracy of the "Time Out" information. Verification of the correct person, site, and procedure were performed and confirmed by me, the nursing staff, and the patient. "Time-out"  conducted as per Joint Commission's Universal Protocol (UP.01.01.01). Time: 0847 Start Time: 0847 hrs.  Description/Narrative of Procedure:          Rationale (medical necessity): procedure needed and proper for the diagnosis and/or treatment of the patient's medical symptoms and needs. Procedural Technique Safety Precautions: Aspiration looking for blood return was conducted prior to all injections. At no point did we inject any substances, as a needle was being advanced. No attempts were made at seeking any paresthesias. Safe injection practices and needle disposal techniques used. Medications properly checked for expiration dates. SDV (single dose vial) medications used. Description of the Procedure: Protocol guidelines were followed. The patient was assisted into a comfortable position. The target area was identified and the area prepped in the usual manner. Skin & deeper tissues infiltrated with local anesthetic. Appropriate amount of time allowed to pass for local anesthetics to take effect. The procedure needles were then advanced to the target area. Proper needle placement secured. Negative aspiration confirmed. Solution injected in intermittent fashion, asking for systemic symptoms every 0.5cc of injectate. The needles were then removed and the area cleansed, making sure to leave some of the prepping solution back to take advantage of its long term bactericidal properties.  Technical description of procedure: Availability of a responsible, adult driver, and NPO status confirmed. Informed consent was obtained after having discussed risks and possible complications. An IV was started. The patient was then taken to the fluoroscopy suite,  where the patient was placed in position for the procedure, over the fluoroscopy table. The patient was then monitored in the usual manner. Fluoroscopy was manipulated to obtain the best possible view of the target. Parallex error was corrected before commencing the procedure. Once a clear view of the target had been obtained, the skin and deeper tissues over the procedure site were infiltrated using lidocaine, loaded in a 10 cc luer-loc syringe with a 0.5 inch, 25-G needle. The introducer needle(s) was/were then inserted through the skin and deeper tissues. A paramidline approach was used to enter the posterior epidural space at a 30 angle, using "Loss-of-resistance Technique" with 3 ml of PF-NaCl (0.9% NSS). Correct needle placement was confirmed in the antero-posterior and lateral fluoroscopic views. The lead was gently introduced and manipulated under real-time fluoroscopy, constantly assessing for pain, discomfort, or paresthesias, until the tip rested at the desired level. Both sides were done in identical fashion. Electrode placement was tested until appropriate coverage was attained. Once the patient confirmed that the stimulation was over the desired area, the lead(s) was/were secured in place and the introducer needles removed. This was done under real-time fluoroscopy while observing the electrode tip to avoid unintended migration. The area was covered with a non-occlusive dressing and the patient transported to recovery for further programming.  Vitals:   07/23/23 0945 07/23/23 0955 07/23/23 1005 07/23/23 1015  BP: (!) 139/103 (!) 143/75 (!) 159/80 (!) 147/81  Pulse:      Resp: 16 20 17 20   Temp: 98.4 F (36.9 C)   98.5 F (36.9 C)  TempSrc:      SpO2: 100% 100% 100% 100%  Weight:      Height:        Start Time: 0847 hrs. End Time: 0934 hrs.  Neurostimulator Details:   Lead(s):  Brand: Medtronic         Epidural Access Level:  L1-2 L1-2  Lead implant:  Bilateral   No. of  Electrodes/Lead:  8 8  Laterality:  Left Right  Top  electrode location:  T8 Mid T7 Bottom  Bottom electrode location:  T10 Mid T9 Lower  Model No.: [id number]  Length: 60 cm Same  Lot No.: [id number]  MRI compatibility:  Yes Yes  External Neurostimulator Temporary    Model No.: N/a   Serial No.: N/a    Imaging Guidance (Spinal):          Type of Imaging Technique: Fluoroscopy Guidance (Spinal) Indication(s): Assistance in needle guidance and placement for procedures requiring needle placement in or near specific anatomical locations not easily accessible without such assistance. Exposure Time: Please see nurses notes. Contrast: None used. Fluoroscopic Guidance: I was personally present during the use of fluoroscopy. "Tunnel Vision Technique" used to obtain the best possible view of the target area. Parallax error corrected before commencing the procedure. "Direction-depth-direction" technique used to introduce the needle under continuous pulsed fluoroscopy. Once target was reached, antero-posterior, oblique, and lateral fluoroscopic projection used confirm needle placement in all planes. Images permanently stored in EMR.      Interpretation: No contrast injected. I personally interpreted the imaging intraoperatively. Adequate needle placement confirmed in multiple planes. Permanent images saved into the patient's record.  Antibiotic Prophylaxis:   Anti-infectives (From admission, onward)    Start     Dose/Rate Route Frequency Ordered Stop   07/23/23 0815  vancomycin (VANCOREADY) IVPB 1500 mg/300 mL        1,500 mg 150 mL/hr over 120 Minutes Intravenous  Once 07/23/23 0806 07/23/23 1030   07/23/23 0000  clarithromycin (BIAXIN) 500 MG tablet        500 mg Oral 2 times daily 07/23/23 0945 07/30/23 2359      Indication(s): None identified  Post-operative Assessment:  Post-procedure Vital Signs:  Pulse/HCG Rate: 6680 Temp: 98.5 F (36.9 C) Resp: 20 BP: (!)  147/81 SpO2: 100 %  Complications: No immediate post-treatment complications observed by team, or reported by patient.  Note: The patient tolerated the entire procedure well. A repeat set of vitals were taken after the procedure and the patient was kept under observation following institutional policy, for this type of procedure. Post-procedural neurological assessment was performed, showing return to baseline, prior to discharge. The patient was provided with post-procedure discharge instructions, including a section on how to identify potential problems. Should any problems arise concerning this procedure, the patient was given instructions to immediately contact us, at any time, without hesitation. In any case, we plan to contact the patient by telephone for a follow-up status report regarding this interventional procedure.  Comments:  No additional relevant information.  Plan of Care  Orders:  Orders Placed This Encounter  Procedures   [facility] TRIAL    Contact medical implant company representative to make sure they are available to provide required equipment.    Scheduling Instructions:     Side: Bilateral     Level: Lumbar     Device: Medtronic     Sedation: With sedation     Timeframe: Today    Order Specific Question:   Where will this procedure be performed?    Answer:   [facility]   DG PAIN [facility] C-ARM 1-60 MIN NO REPORT    Intraoperative interpretation by procedural physician at [facility].    Standing Status:   Standing    Number of Occurrences:   1    Order Specific Question:   Reason for exam:    Answer:   Assistance in needle guidance and placement for procedures requiring needle placement  in or near specific anatomical locations not easily accessible without such assistance.   Provide equipment / supplies at bedside    Procedure tray: Epidural Tray (x1) Additional material(s):  1. Sterile towel pack (x2) 2. Small streight hemostat (x2) 3. Large  hemostat (x1) 4. Sutures (cutting needle) 0-silk (x1) 5. Needle holder (x1) 6. Scissors ([place]o) (x1) 7. 3/4" Steri-strip pack (x2) 8. 4x4 Gauze Pack (x1) 9. Large sterile transparent dressings (Tegaderm) (x1)    Standing Status:   Standing    Number of Occurrences:   1    Order Specific Question:   Specify    Answer:   Epidural Tray & Minor Surgery Tray   Follow-up    Post-procedure Phone Call: Call patient tomorrow for routine early follow-up evaluation.  Return Appointment Timeframe: Approximately 6-7 days, to remove Trial leads.    Standing Status:   Standing    Number of Occurrences:   1    Order Specific Question:   Specify    Answer:   Schedule a return appointment for post-procedure evaluation. In addition arrange for patient to receive a follow-up phone call tomorrow to assess post-procedure status.   Informed Consent Details: Physician/Practitioner Attestation; Transcribe to consent form and obtain patient signature    Note: Always confirm laterality of pain with Ms. Leithead, before procedure. Transcribe to consent form and obtain patient signature.    Scheduling Instructions:     CPT Code: PERCUTANEOUS IMPLANTATION OF NEUROSTIMULATOR ELECTRODE ARRAY, EPIDURAL (16109)     ICD-10-CM Support Codes:    Order Specific Question:   Physician/Practitioner attestation of informed consent for procedure/surgical case    Answer:   I, the physician/practitioner, attest that I have discussed with the patient the benefits, risks, side effects, alternatives, likelihood of achieving goals and potential problems during recovery for the procedure that I have provided informed consent.    Order Specific Question:   Procedure    Answer:   Neurostimulator Trial    Order Specific Question:   Physician/Practitioner performing the procedure    Answer:   Zorianna Taliaferro A. Laban Emperor, MD    Order Specific Question:   Indication/Reason    Answer:   Regional Chronic Pain Syndrome   Miscellanous precautions     Standing Status:   Standing    Number of Occurrences:   1   Latex precautions    Activate Latex-Free Protocol.    Standing Status:   Standing    Number of Occurrences:   1   Chronic Opioid Analgesic:  No chronic opioid analgesics therapy prescribed by our practice. None MME/day: 0 mg/day   Medications administered: We administered lidocaine, pentafluoroprop-tetrafluoroeth, midazolam, ropivacaine (PF) 2 mg/mL (0.2%), sodium chloride flush, vancomycin HCl, and diphenhydrAMINE.  See the medical record for exact dosing, route, and time of administration.  Follow-up plan:   Return in about 1 week (around 07/30/2023) for Proc-day (T,Th), (Face2F), SCS removal.       Interventional Therapies  Risk Factors  Considerations:  ALLERGY: CONTRAST DYE  Fentanyl.  (Extremely poor historian) (poor intraprocedure compliance) (symptom exaggeration)  GERD  HTN  Anxiety  Memory impairment     Planned  Pending:   (03/21/2023) neurosurgery referral to Dr. Marcell Barlow for a left-sided L5-S1 foraminal stenosis decompression.   Under consideration:      Completed: (Extremely poor historian)  Diagnostic/therapeutic left L5 + S1 TFESI x1 (02/07/2023) (100/100/40/0)  Therapeutic left caudal ESI x3 (03/05/2023) (100/80/0/0) Therapeutic left L2-3 LESI x2 (09/03/2017)  Therapeutic left L2 TFESI x2 (09/03/2017)  Therapeutic left RACZ procedure x1 (10/31/2017)  Diagnostic left lumbar facet MBB x1 (05/08/2018) (did not keep follow-up) (100/100/100/100 as of 01/30/2023)    Completed by other providers:   Diagnostic EMG/PNCV (lower extremity) (02/18/2017) by Theora Master, MD [facility] neurology) Dx: Severe, mixed, acquired polyneuropathy of the lower extremities.   Therapeutic  Palliative (PRN) options:   None established      Recent Visits Date Type Provider Dept  05/27/23 Office Visit Delano Metz, MD [facility]  Showing recent visits within past 90 days and meeting all other  requirements Today's Visits Date Type Provider Dept  07/23/23 Procedure visit Delano Metz, MD [facility]  Showing today's visits and meeting all other requirements Future Appointments Date Type Provider Dept  07/30/23 Appointment Delano Metz, MD [facility]  Showing future appointments within next 90 days and meeting all other requirements  Disposition: Discharge home  Discharge (Date  Time): 07/23/2023;   hrs.   Primary Care Physician: Glori Luis, MD Location: [facility] Note by: Oswaldo Done, MD (TTS technology used. I apologize for any typographical errors that were not detected and corrected.) Date: 07/23/2023; Time: 11:53 AM

## 2023-07-23 ENCOUNTER — Encounter: Payer: Self-pay | Admitting: Pain Medicine

## 2023-07-23 ENCOUNTER — Ambulatory Visit: Payer: Medicare HMO | Attending: Pain Medicine | Admitting: Pain Medicine

## 2023-07-23 ENCOUNTER — Ambulatory Visit
Admission: RE | Admit: 2023-07-23 | Discharge: 2023-07-23 | Disposition: A | Discharge status: Home / Self Care | Payer: Medicare HMO | Source: Ambulatory Visit | Attending: Pain Medicine | Admitting: Pain Medicine

## 2023-07-23 VITALS — BP 147/81 | HR 66 | Temp 98.5°F | Resp 20 | Ht 73.0 in | Wt 211.0 lb

## 2023-07-23 DIAGNOSIS — Z91041 Radiographic dye allergy status: Secondary | ICD-10-CM | POA: Diagnosis present

## 2023-07-23 DIAGNOSIS — G8929 Other chronic pain: Secondary | ICD-10-CM

## 2023-07-23 DIAGNOSIS — M5416 Radiculopathy, lumbar region: Secondary | ICD-10-CM | POA: Insufficient documentation

## 2023-07-23 DIAGNOSIS — Z91040 Latex allergy status: Secondary | ICD-10-CM | POA: Diagnosis present

## 2023-07-23 DIAGNOSIS — M62838 Other muscle spasm: Secondary | ICD-10-CM | POA: Insufficient documentation

## 2023-07-23 DIAGNOSIS — M48061 Spinal stenosis, lumbar region without neurogenic claudication: Secondary | ICD-10-CM | POA: Insufficient documentation

## 2023-07-23 DIAGNOSIS — M5136 Other intervertebral disc degeneration, lumbar region: Secondary | ICD-10-CM | POA: Insufficient documentation

## 2023-07-23 DIAGNOSIS — M4807 Spinal stenosis, lumbosacral region: Secondary | ICD-10-CM | POA: Diagnosis present

## 2023-07-23 DIAGNOSIS — M961 Postlaminectomy syndrome, not elsewhere classified: Secondary | ICD-10-CM | POA: Insufficient documentation

## 2023-07-23 DIAGNOSIS — R200 Anesthesia of skin: Secondary | ICD-10-CM | POA: Insufficient documentation

## 2023-07-23 DIAGNOSIS — Z96643 Presence of artificial hip joint, bilateral: Secondary | ICD-10-CM | POA: Diagnosis present

## 2023-07-23 DIAGNOSIS — Z5189 Encounter for other specified aftercare: Secondary | ICD-10-CM | POA: Diagnosis present

## 2023-07-23 DIAGNOSIS — T50905A Adverse effect of unspecified drugs, medicaments and biological substances, initial encounter: Secondary | ICD-10-CM | POA: Diagnosis present

## 2023-07-23 DIAGNOSIS — M4310 Spondylolisthesis, site unspecified: Secondary | ICD-10-CM | POA: Insufficient documentation

## 2023-07-23 DIAGNOSIS — L298 Other pruritus: Secondary | ICD-10-CM | POA: Insufficient documentation

## 2023-07-23 DIAGNOSIS — M5417 Radiculopathy, lumbosacral region: Secondary | ICD-10-CM | POA: Insufficient documentation

## 2023-07-23 DIAGNOSIS — M79605 Pain in left leg: Secondary | ICD-10-CM | POA: Diagnosis not present

## 2023-07-23 DIAGNOSIS — R202 Paresthesia of skin: Secondary | ICD-10-CM | POA: Diagnosis present

## 2023-07-23 DIAGNOSIS — M79604 Pain in right leg: Secondary | ICD-10-CM | POA: Diagnosis not present

## 2023-07-23 DIAGNOSIS — M5442 Lumbago with sciatica, left side: Secondary | ICD-10-CM | POA: Insufficient documentation

## 2023-07-23 DIAGNOSIS — G96198 Other disorders of meninges, not elsewhere classified: Secondary | ICD-10-CM

## 2023-07-23 DIAGNOSIS — Z981 Arthrodesis status: Secondary | ICD-10-CM | POA: Insufficient documentation

## 2023-07-23 MED ORDER — MIDAZOLAM HCL 5 MG/5ML IJ SOLN
0.5000 mg | Freq: Once | INTRAMUSCULAR | Status: AC
  Administered 2023-07-23 (×2): 1 mg via INTRAVENOUS
  Administered 2023-07-23: 0.5 mg via INTRAVENOUS

## 2023-07-23 MED ORDER — ROPIVACAINE HCL 2 MG/ML IJ SOLN
INTRAMUSCULAR | Status: AC
  Filled 2023-07-23: qty 20

## 2023-07-23 MED ORDER — LIDOCAINE HCL 2 % IJ SOLN
INTRAMUSCULAR | Status: AC
  Filled 2023-07-23: qty 20

## 2023-07-23 MED ORDER — VANCOMYCIN HCL 1500 MG/300ML IV SOLN
1500.0000 mg | Freq: Once | INTRAVENOUS | Status: AC
  Administered 2023-07-23: 1500 mg via INTRAVENOUS
  Filled 2023-07-23 (×2): qty 300

## 2023-07-23 MED ORDER — PENTAFLUOROPROP-TETRAFLUOROETH EX AERO
INHALATION_SPRAY | Freq: Once | CUTANEOUS | Status: AC
  Administered 2023-07-23: 30 via TOPICAL
  Filled 2023-07-23: qty 116

## 2023-07-23 MED ORDER — ROPIVACAINE HCL 2 MG/ML IJ SOLN
20.0000 mL | Freq: Once | INTRAMUSCULAR | Status: AC
  Administered 2023-07-23: 20 mL

## 2023-07-23 MED ORDER — LACTATED RINGERS IV SOLN: Freq: Once | INTRAVENOUS | Status: DC

## 2023-07-23 MED ORDER — CLARITHROMYCIN 500 MG PO TABS
500.0000 mg | ORAL_TABLET | Freq: Two times a day (BID) | ORAL | 0 refills | Status: AC
Start: 2023-07-23 — End: 2023-07-30

## 2023-07-23 MED ORDER — DIPHENHYDRAMINE HCL 50 MG/ML IJ SOLN
25.0000 mg | Freq: Once | INTRAMUSCULAR | Status: AC
  Administered 2023-07-23: 25 mg via INTRAVENOUS

## 2023-07-23 MED ORDER — LIDOCAINE HCL 2 % IJ SOLN
20.0000 mL | Freq: Once | INTRAMUSCULAR | Status: AC
  Administered 2023-07-23: 400 mg

## 2023-07-23 MED ORDER — SODIUM CHLORIDE 0.9% FLUSH
10.0000 mL | Freq: Once | INTRAVENOUS | Status: AC
  Administered 2023-07-23: 10 mL

## 2023-07-23 MED ORDER — FENTANYL CITRATE (PF) 100 MCG/2ML IJ SOLN
INTRAMUSCULAR | Status: AC
  Filled 2023-07-23: qty 2

## 2023-07-23 MED ORDER — DIPHENHYDRAMINE HCL 50 MG/ML IJ SOLN
INTRAMUSCULAR | Status: AC
  Filled 2023-07-23: qty 1

## 2023-07-23 MED ORDER — MIDAZOLAM HCL 5 MG/5ML IJ SOLN
INTRAMUSCULAR | Status: AC
  Filled 2023-07-23: qty 5

## 2023-07-23 NOTE — Progress Notes (Signed)
Safety precautions to be maintained throughout the outpatient stay will include: orient to surroundings, keep bed in low position, maintain call bell within reach at all times, provide assistance with transfer out of bed and ambulation.  

## 2023-07-23 NOTE — Patient Instructions (Signed)
  ____________________________________________________________________________________________  Muscle Spasms & Cramps  Cause(s):  Most common - vitamin and/or electrolyte (calcium, potassium, sodium, etc.) deficiencies. Post procedure - steroids (injected, oral, or inhaled) can make your kidneys excrete (loose) electrolytes. Most of the time this will not cause any symptoms however, if you happen to be borderline low on your electrolytes, it may temporarily triggering cramps & spasms.  Possible triggers: Sweating - causes loss of electrolytes thru the skin. Steroids - causes loss of electrolytes thru the urine.  Treatment: (over-the-counter)  Gatorade (or any other electrolyte-replenishing drink) - Take 1, 8 oz glass with each meal (3 times a day). Mechanism of action: Replenishes lost electrolytes. Magnesium 400 to 500 mg - Take 1 tablet twice a day (one with breakfast and one at bedtime). If you have kidney disease talk to your primary care physician before taking any Magnesium. Mechanism of action: Magnesium is a natural muscle relaxant. Tonic Water with quinine - Take 1, 8 oz glass before bedtime.  Mechanism of action: Quinine is used to treat spasms.  Last Update: 06/27/2023  ____________________________________________________________________________________________

## 2023-07-24 ENCOUNTER — Telehealth: Payer: Self-pay | Admitting: Pain Medicine

## 2023-07-24 ENCOUNTER — Telehealth: Payer: Self-pay

## 2023-07-24 NOTE — Telephone Encounter (Signed)
Post procedure follow up.  Patient states she had a rough night but would call us if she needed [place].

## 2023-07-24 NOTE — Telephone Encounter (Signed)
Spoke with Adam from [employer] and he states he has talked to the patient.

## 2023-07-24 NOTE — Telephone Encounter (Signed)
PT called states that the Maria Hamilton from [employer] was suppose to give her a call this morning to give her information about the SCS. Patient wants to know if someone can contact him. Please give patient a call. TY

## 2023-07-25 ENCOUNTER — Encounter: Payer: Self-pay | Admitting: Pain Medicine

## 2023-07-25 ENCOUNTER — Ambulatory Visit: Payer: Medicare HMO | Attending: Pain Medicine | Admitting: Pain Medicine

## 2023-07-25 VITALS — BP 120/68 | HR 68 | Temp 97.1°F | Resp 18 | Wt 211.0 lb

## 2023-07-25 DIAGNOSIS — Z9889 Other specified postprocedural states: Secondary | ICD-10-CM

## 2023-07-25 DIAGNOSIS — Z09 Encounter for follow-up examination after completed treatment for conditions other than malignant neoplasm: Secondary | ICD-10-CM | POA: Diagnosis not present

## 2023-07-25 NOTE — Progress Notes (Signed)
[name]  Service Category: E/M  Provider: Oswaldo Done, MD  DOB: [birth year]  DOS: 07/25/2023  Referring Provider: Glori Luis, MD  MRN: [id number]  Setting: Ambulatory outpatient  PCP: Glori Luis, MD  Type: Established Patient  Specialty: Interventional Pain Management    Location: Office  Delivery: Face-to-face     Purpose:  The patient comes in today for evaluation of operative/procedural site. Case was discussed with attending physician.  The patient indicated having some irritation over where the tape was placed for the spinal cord stimulator lead.  This was checked and found to be within normal limits.  The area was cleaned and tape was placed.  Subjective:  Maria Hamilton is a 72 y.o. year old, female patient, who comes today for a nurse visit complaining of Back Pain (lower) Her last contact with [place] was on 07/24/2023. Severity of the pain is described as a  /10.   Concepcion Elk, RN  07/25/2023  1:56 PM  Sign when Signing Visit Safety precautions to be maintained throughout the outpatient stay will include: orient to surroundings, keep bed in low position, maintain call bell within reach at all times, provide assistance with transfer out of bed and ambulation.  Patient complaining of severe itching under the tape at SCS insertion site. Tape removed, no redness or rash. New tegaderm and tape applied.. Offered to use softer tape, but she would need to take special care not to allow it to come off. Patient prefers to have the original type tape applied. She will take Benadryl as needed until removal of leads next week.    Objective:  Maria Hamilton  weight is 211 lb (95.7 kg). Her temporal temperature is 97.1 F (36.2 C) (abnormal). Her blood pressure is 120/68 and her pulse is 68. Her respiration is 18 and oxygen saturation is 100%.  Body mass index is 27.84 kg/m.  Analgesic:  No chronic opioid analgesics therapy prescribed by our practice.  None MME/day: 0 mg/day   Allergies:  Patient is allergic to ace inhibitors, ciprofloxacin, fentanyl, hctz [hydrochlorothiazide], latex, morphine and codeine, penicillin g benzathine, isovue m [iopamidol], and tape.  Labs:  Lab Results  Component Value Date   BUN 10 03/20/2023   CREATININE 0.94 03/20/2023   GFRAA >60 07/10/2018   GFRNONAA >60 04/05/2021    Assessment:  The encounter diagnosis was Postop check.  Attestation: Medical screening examination/treatment/procedure(s) were performed by non-physician practitioner and as supervising physician I was immediately available for consultation/collaboration.  Plan of Care (POC)  Orders:  No orders of the defined types were placed in this encounter.  Chronic Opioid Analgesic:  No chronic opioid analgesics therapy prescribed by our practice. None MME/day: 0 mg/day   Medications ordered for procedure: No orders of the defined types were placed in this encounter.  Medications administered: Maria Hamilton had no medications administered during this visit.  See the medical record for exact dosing, route, and time of administration.  Follow-up plan:   Return for scheduled encounter.       Interventional Therapies  Risk Factors  Considerations:  ALLERGY: CONTRAST DYE  Fentanyl.  (Extremely poor historian) (poor intraprocedure compliance) (symptom exaggeration)  GERD  HTN  Anxiety  Memory impairment     Planned  Pending:   (03/21/2023) neurosurgery referral to Dr. Marcell Barlow for a left-sided L5-S1 foraminal stenosis decompression.   Under consideration:      Completed: (Extremely poor historian)  Diagnostic/therapeutic left L5 + S1 TFESI  x1 (02/07/2023) (100/100/40/0)  Therapeutic left caudal ESI x3 (03/05/2023) (100/80/0/0) Therapeutic left L2-3 LESI x2 (09/03/2017)  Therapeutic left L2 TFESI x2 (09/03/2017)  Therapeutic left RACZ procedure x1 (10/31/2017)  Diagnostic left lumbar facet MBB x1 (05/08/2018) (did not  keep follow-up) (100/100/100/100 as of 01/30/2023)    Completed by other providers:   Diagnostic EMG/PNCV (lower extremity) (02/18/2017) by Theora Master, MD [facility] neurology) Dx: Severe, mixed, acquired polyneuropathy of the lower extremities.   Therapeutic  Palliative (PRN) options:   None established      Recent Visits Date Type Provider Dept  07/23/23 Procedure visit Delano Metz, MD [facility]  05/27/23 Office Visit Delano Metz, MD [facility]  Showing recent visits within past 90 days and meeting all other requirements Today's Visits Date Type Provider Dept  07/25/23 Office Visit Delano Metz, MD [facility]  Showing today's visits and meeting all other requirements Future Appointments Date Type Provider Dept  07/30/23 Appointment Delano Metz, MD [facility]  Showing future appointments within next 90 days and meeting all other requirements  Disposition: Discharge home  Discharge (Date  Time): 07/25/2023;   hrs.   Primary Care Physician: Glori Luis, MD Location: [facility] Note by: Oswaldo Done, MD (TTS technology used. I apologize for any typographical errors that were not detected and corrected.) Date: 07/25/2023; Time: 2:29 PM  Disclaimer:  Medicine is not an Visual merchandiser. The only guarantee in medicine is that nothing is guaranteed. It is important to note that the decision to proceed with this intervention was based on the information collected from the patient. The Data and conclusions were drawn from the patient's questionnaire, the interview, and the physical examination. Because the information was provided in large part by the patient, it cannot be guaranteed that it has not been purposely or unconsciously manipulated. Every effort has been made to obtain as much relevant data as possible for this evaluation. It is important to note that the conclusions that lead  to this procedure are derived in large part from the available data. Always take into account that the treatment will also be dependent on availability of resources and existing treatment guidelines, considered by other Pain Management Practitioners as being common knowledge and practice, at the time of the intervention. For Medico-Legal purposes, it is also important to point out that variation in procedural techniques and pharmacological choices are the acceptable norm. The indications, contraindications, technique, and results of the above procedure should only be interpreted and judged by a Board-Certified Interventional Pain Specialist with extensive familiarity and expertise in the same exact procedure and technique.

## 2023-07-25 NOTE — Progress Notes (Signed)
Safety precautions to be maintained throughout the outpatient stay will include: orient to surroundings, keep bed in low position, maintain call bell within reach at all times, provide assistance with transfer out of bed and ambulation.  Patient complaining of severe itching under the tape at SCS insertion site. Tape removed, no redness or rash. New tegaderm and tape applied.. Offered to use softer tape, but she would need to take special care not to allow it to come off. Patient prefers to have the original type tape applied. She will take Benadryl as needed until removal of leads next week.

## 2023-07-30 ENCOUNTER — Ambulatory Visit: Payer: Medicare HMO | Attending: Pain Medicine | Admitting: Pain Medicine

## 2023-07-30 VITALS — BP 124/80 | HR 73 | Temp 97.2°F | Resp 14 | Ht 73.0 in | Wt 211.0 lb

## 2023-07-30 DIAGNOSIS — M79604 Pain in right leg: Secondary | ICD-10-CM | POA: Diagnosis not present

## 2023-07-30 DIAGNOSIS — Z981 Arthrodesis status: Secondary | ICD-10-CM

## 2023-07-30 DIAGNOSIS — M79605 Pain in left leg: Secondary | ICD-10-CM | POA: Diagnosis not present

## 2023-07-30 DIAGNOSIS — R200 Anesthesia of skin: Secondary | ICD-10-CM

## 2023-07-30 DIAGNOSIS — Z96643 Presence of artificial hip joint, bilateral: Secondary | ICD-10-CM

## 2023-07-30 DIAGNOSIS — Z09 Encounter for follow-up examination after completed treatment for conditions other than malignant neoplasm: Secondary | ICD-10-CM

## 2023-07-30 DIAGNOSIS — M5417 Radiculopathy, lumbosacral region: Secondary | ICD-10-CM

## 2023-07-30 DIAGNOSIS — G8929 Other chronic pain: Secondary | ICD-10-CM

## 2023-07-30 DIAGNOSIS — M961 Postlaminectomy syndrome, not elsewhere classified: Secondary | ICD-10-CM

## 2023-07-30 NOTE — Progress Notes (Signed)
PROVIDER NOTE: Interpretation of information contained herein should be left to medically-trained personnel. Specific patient instructions are provided elsewhere under "Patient Instructions" section of medical record. This document was created in part using STT-dictation technology, any transcriptional errors that may result from this process are unintentional.  [name] Type: Established DOB: 05/19/1951 MRN: [id number] PCP: Glori Luis, MD  Service: Procedure DOS: 07/30/2023 Setting: Ambulatory Location: Ambulatory outpatient facility Delivery: Face-to-face Provider: Oswaldo Done, MD Specialty: Interventional Pain Management Specialty designation: 09 Location: Outpatient facility Ref. Prov.: Glori Luis, MD       Interventional Therapy   Primary Reason for Admission: Surgical management of chronic pain condition.  Procedure:  Anesthesia, Analgesia, Anxiolysis:  Type: Removal of Trial Neurostimulator Leads Purpose: End-of-trial Region: Lumbar Laterality: Bilateral   None required   1. Chronic lower extremity pain (1ry area of Pain) (Bilateral) (L>R)   2. Chronic low back pain (2ry area of Pain) (Left) w/ sciatica (Left)   3. Chronic lumbar radicular pain (L5/S1 dermatome) (Bilateral) (L>R)   4. Failed back surgical syndrome (x 3)   5. Lumbosacral radiculopathy at L5 (Left)   6. Numbness and tingling of feet (Bilateral) (L>R)   7. History of lumbar fusion (L3-4, L4-5, and L5-S1)   8. History of total hip replacement, bilateral   9. Postop check    NAS-11 Pain score:   Pre-procedure: 7 /10   Post-procedure: 7 /10   Note: The patient indicated having had excellent benefits from the SCS trial. She has indicated her desire to have the permanent implant. Today we will be sending her back to Dr. Myer Haff for implant.   Neuromodulation Implant Therapy Assessment  Epidural Neurostimulator implant: Side-effects or Adverse reactions: None  reported Effectiveness: Described as relatively effective, allowing for increase in activities of daily living (ADL) Plan:  Referral for implant of permanent device.       H&P (Pre-op Assessment):  Maria Hamilton is a 72 y.o. (year old), female patient, seen today for removal of neurostimulator trial lead(s). She  has a past surgical history that includes Breast surgery; Back surgery; Ectopic pregnancy surgery; Hip surgery; Abdominal hysterectomy; cyst removal from wrist; Cholecystectomy (N/A, 10/16/2016); Oophorectomy; Breast biopsy (Left, 90s); and Colonoscopy with propofol (N/A, 12/22/2021).  Initial Vital Signs:  Pulse: 73  Temp: (!) 97.2 F (36.2 C) Resp: 14 BP: 124/80 SpO2: 100 %  BMI: Estimated body mass index is 27.84 kg/m as calculated from the following:   Height as of this encounter: 6\' 1"  (1.854 m).   Weight as of this encounter: 211 lb (95.7 kg).  Risk Assessment: Allergies: Reviewed. She is allergic to ace inhibitors, ciprofloxacin, fentanyl, hctz [hydrochlorothiazide], latex, morphine and codeine, penicillin g benzathine, isovue m [iopamidol], and tape.  Allergy Precautions: None required Coagulopathies: Reviewed. None identified.  Blood-thinner therapy: None at this time Active Infection(s): Reviewed. None identified. Maria Hamilton is afebrile  Site Confirmation: Maria Hamilton was asked to confirm the procedure and laterality before marking the site, which she did. Procedure checklist: Completed Consent: Maria Hamilton consents to removal of trial leads. Attestation: I, the ordering provider, attest that I have discussed with the patient the risks and potential problems associated with procedure. Date  Time: 07/30/2023  8:01 AM  Pre-Procedure Preparation:  Monitoring: As per clinic protocol. Respiration, ETCO2, SpO2, BP, heart rate and rhythm monitor placed and checked for adequate function Safety Precautions: Patient was assessed for positional comfort and pressure  points before starting the procedure. Time-out: I initiated and  conducted the "Time-out" before starting the procedure, as per protocol. The patient was asked to participate by confirming the accuracy of the "Time Out" information. Verification of the correct person, site, and procedure were performed and confirmed by me, the nursing staff, and the patient. "Time-out" conducted as per Joint Commission's Universal Protocol (UP.01.01.01). Time:   Start Time:   hrs.  Description of Procedure Process:   Position: Sitting Area Prepped: Around implant device site Prepping solution: ChloraPrep (2% chlorhexidine gluconate and 70% isopropyl alcohol) Safety Precautions: Sterile technique used  Description of the Procedure: Availability of a responsible, adult driver, and NPO status confirmed. Informed consent was obtained after having discussed risks and possible complications. An IV was started. The patient was then taken to the fluoroscopy suite, where the patient was placed in position for the procedure, over the fluoroscopy table. The patient was then monitored in the usual manner. The bandages were removed and the surgical area was prepped using a broad-spectrum topical antiseptic. Meaningful verbal contact was maintained with the patient at all times. The stitches were then removed and the patient asked to mildly flex the spine. Using constant tension over the leads, the electrodes were removed in their entirety, without any apparent complication. The patient tolerated the entire procedure well. Following the performance of this procedure, the patient was kept under observation until the discharge criteria were met. The patient was sent home in stable condition. The patient was provided with discharge instructions, including a section on how to identify potential problems. Should any problems arise concerning this procedure, the patient was given instructions to contact us, without hesitation. In any case,  we will contact the patient by telephone for a follow-up status report regarding this interventional procedure.  Vitals:   07/30/23 0801  BP: 124/80  Pulse: 73  Resp: 14  Temp: (!) 97.2 F (36.2 C)  TempSrc: Temporal  SpO2: 100%  Weight: 211 lb (95.7 kg)  Height: 6\' 1"  (1.854 m)   Start Time:   hrs. End Time:   hrs.  Materials: Sterile suture removal kit; Sterile gloves; Sterile gauze; Band-aid  Medication(s): N/A  Post-operative Assessment:  Post-procedure Vital Signs:  Pulse/HCG Rate: 73  Temp: (!) 97.2 F (36.2 C) Resp: 14 BP: 124/80 SpO2: 100 %  Complications: No immediate post-treatment complications observed by team, or reported by patient.  Note: The patient tolerated the entire procedure well. A repeat set of vitals were taken after the procedure and the patient was kept under observation following institutional policy, for this type of procedure. Post-procedural neurological assessment was performed, showing return to baseline, prior to discharge. The patient was provided with post-procedure discharge instructions, including a section on how to identify potential problems. Should any problems arise concerning this procedure, the patient was given instructions to immediately contact us, at any time, without hesitation. In any case, we plan to contact the patient by telephone for a follow-up status report regarding this interventional procedure.  Comments:  No additional relevant information.  Plan of Care  Orders:  Orders Placed This Encounter  Procedures   Spinal cord stimulator, removal of trial lead implant    Standing Status:   Standing    Number of Occurrences:   1   Suture removal kit    Please have suture removal kit available in exam room.    Standing Status:   Standing    Number of Occurrences:   1   Provide equipment / supplies at bedside    "Suture Removal Kit" (  Disposable  single use)    Standing Status:   Standing    Number of Occurrences:   1     Order Specific Question:   Specify    Answer:   Suture Removal Kit   Informed Consent Details: Physician/Practitioner Attestation; Transcribe to consent form and obtain patient signature    Standing Status:   Standing    Number of Occurrences:   1    Order Specific Question:   Physician/Practitioner attestation of informed consent for procedure/surgical case    Answer:   I, the physician/practitioner, attest that I have discussed with the patient the benefits, risks, side effects, alternatives, likelihood of achieving goals and potential problems during recovery for the procedure that I have provided informed consent.    Order Specific Question:   Procedure    Answer:   Removal of permanent epidural neurostimulator implant system    Order Specific Question:   Physician/Practitioner performing the procedure    Answer:   Sulma Ruffino A. Laban Emperor, MD    Order Specific Question:   Indication/Reason    Answer:   Nonfunctional/dysfunctional neurostimulator system   LEAD REMOVAL    Have suture removal kit available in room. Other equipment: Alcohol Prep; sterile gloves; sterile scissors (suture removal kit); Band-Aid; 4x4 gauze.    Scheduling Instructions:     Procedure: Neurostimulator lead removal     Laterality: N/A     Sedation: No Sedation     Timeframe:  Today   Lumbar spinal cord simulator lead removal    Have suture removal kit available in room. Other equipment: Alcohol Prep; sterile gloves; sterile scissors (suture removal kit); Band-Aid; 4x4 gauze.    Scheduling Instructions:     Procedure: Lumbar spinal cord simulator lead removal     Laterality: N/A     Sedation: No Sedation     Timeframe:  Today   Latex precautions    Activate Latex-Free Protocol.    Standing Status:   Standing    Number of Occurrences:   1   Chronic Opioid Analgesic:  No chronic opioid analgesics therapy prescribed by our practice. None MME/day: 0 mg/day   Medications administered: Maria Hamilton  had no medications administered during this visit.  See the medical record for exact dosing, route, and time of administration.  Follow-up plan:   Return in about 2 weeks (around 08/13/2023) for Eval-day (M,W).       Interventional Therapies  Risk Factors  Considerations:  ALLERGY: CONTRAST DYE  Fentanyl.  (Extremely poor historian) (poor intraprocedure compliance) (symptom exaggeration)  GERD  HTN  Anxiety  Memory impairment     Planned  Pending:   To Dr. Marcell Barlow for a SCS implant.   Under consideration:      Completed: (Extremely poor historian)  Diagnostic bilateral lumbar spinal cord stimulator trial implant (SCS) x1 (07/23/2023) ( Diagnostic/therapeutic left L5 + S1 TFESI x1 (02/07/2023) (100/100/40/0)  Therapeutic left caudal ESI x3 (03/05/2023) (100/80/0/0) Therapeutic left L2-3 LESI x2 (09/03/2017)  Therapeutic left L2 TFESI x2 (09/03/2017)  Therapeutic left RACZ procedure x1 (10/31/2017)  Diagnostic left lumbar facet MBB x1 (05/08/2018) (did not keep follow-up) (100/100/100/100 as of 01/30/2023)    Completed by other providers:   Diagnostic EMG/PNCV (lower extremity) (02/18/2017) by Theora Master, MD [facility] neurology) Dx: Severe, mixed, acquired polyneuropathy of the lower extremities.   Therapeutic  Palliative (PRN) options:   None established      Recent Visits Date Type Provider Dept  07/25/23 Office Visit Delano Metz, MD [facility]  07/23/23 Procedure visit Delano Metz, MD [facility]  05/27/23 Office Visit Delano Metz, MD [facility]  Showing recent visits within past 90 days and meeting all other requirements Today's Visits Date Type Provider Dept  07/30/23 Procedure visit Delano Metz, MD [facility]  Showing today's visits and meeting all other requirements Future Appointments Date Type Provider Dept  08/13/23 Appointment Delano Metz, MD [facility]  Showing future  appointments within next 90 days and meeting all other requirements  Disposition: Discharge home  Discharge (Date  Time): 07/30/2023; 0841 hrs.   Primary Care Physician: Glori Luis, MD Location: [facility] Note by: Oswaldo Done, MD (TTS technology used. I apologize for any typographical errors that were not detected and corrected.) Date: 07/30/2023; Time: 9:45 AM

## 2023-08-07 ENCOUNTER — Other Ambulatory Visit: Payer: Self-pay | Admitting: Family Medicine

## 2023-08-07 DIAGNOSIS — Z1231 Encounter for screening mammogram for malignant neoplasm of breast: Secondary | ICD-10-CM

## 2023-08-07 DIAGNOSIS — Z0000 Encounter for general adult medical examination without abnormal findings: Secondary | ICD-10-CM

## 2023-08-11 NOTE — Progress Notes (Unsigned)
PROVIDER NOTE: Information contained herein reflects review and annotations entered in association with encounter. Interpretation of such information and data should be left to medically-trained personnel. Information provided to patient can be located elsewhere in the medical record under "Patient Instructions". Document created using STT-dictation technology, any transcriptional errors that may result from process are unintentional.    [name]  Service Category: E/M  Provider: Oswaldo Done, MD  DOB: 11/17/1951  DOS: 08/13/2023  Referring Provider: Glori Luis, MD  MRN: [id number]  Specialty: Interventional Pain Management  PCP: Glori Luis, MD  Type: Established Patient  Setting: Ambulatory outpatient    Location: Office  Delivery: Face-to-face     HPI  [name], a 72 y.o. year old female, is here today because of her Chronic pain of lower extremity, bilateral [M79.604, M79.605, G89.29]. [name] primary complain today is No chief complaint on file.  Pertinent problems: [name] has Rheumatoid factor positive; Fibromyalgia; Chronic low back pain (2ry area of Pain) (Left) w/ sciatica (Left); Chronic pain syndrome; 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); Rheumatoid arthritis, multiple sites (positive RF); Chronic lower extremity pain (1ry area of Pain) (Bilateral) (L>R); Lumbar facet arthropathy (Bilateral); Failed back surgical syndrome (x 3); Epidural fibrosis; Neurogenic pain; Musculoskeletal pain; Chronic lumbar radicular pain (L5/S1 dermatome) (Bilateral) (L>R); History of total hip replacement, bilateral; Polyneuropathy; Post herpetic neuralgia; Abnormal MRI, lumbar spine (02/21/2020); 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); History of lumbar fusion (L3-4, L4-5, and L5-S1); Chronic foot numbness (Left); Ankle pain; DDD (degenerative disc disease), lumbar; Osteoarthritis of hip; Spondylosis without  myelopathy or radiculopathy, lumbosacral region; Chronic hip pain after total replacement of hip joint (Left); Chronic hip pain after total replacement of hip joint (Right); Chronic pain of hip  (Bilateral) (L>R); Chronic sacroiliac joint pain (Bilateral) (L>R); Lumbar facet syndrome (Bilateral) (L>R); Numbness and tingling of feet (Bilateral) (L>R); Muscle cramps; Left knee pain; Arthritis of knee; Bursitis of hip; Contusion of knee; Abnormal NCS (nerve conduction studies) (02/18/2017); Abnormal CT scan, lumbar spine (08/03/2022); Grade 1 Retrolisthesis of L1/L2 & L2/L3; Lumbosacral foraminal stenosis (Left: L5-S1); Lumbosacral radiculopathy at L5 (Left); Chronic lower extremity pain (1ry area of Pain) (Left); Homans sign present (Left); and Muscle spasm of shoulder area (Left) on their pertinent problem list. Pain Assessment: Severity of   is reported as a  /10. Location:    / . Onset:  . Quality:  . Timing:  . Modifying factor(s):  Marland Kitchen Vitals:  vitals were not taken for this visit.  BMI: Estimated body mass index is 27.84 kg/m as calculated from the following:   Height as of 07/30/23: 6\' 1"  (1.854 m).   Weight as of 07/30/23: 211 lb (95.7 kg). Last encounter: 07/25/2023. Last procedure: 07/30/2023.  Reason for encounter: post-procedure evaluation and assessment. ***  Post-procedure evaluation   Type: Trial Spinal Cord Neurostimulator Implant (Percutaneous, interlaminar, posterior epidural placement) Laterality: Bilateral (-50)  Level: Lumbar  Imaging: Fluoroscopic guidance Anesthesia: Local anesthesia (1-2% Lidocaine) Anxiolysis: IV Versed 3.0 mg Sedation: Moderate Sedation Fentanyl 2.0 mL (100 mcg) DOS: 07/23/2023  Performed by: Oswaldo Done, MD  Purpose: Diagnostic. To determine if a permanent implant may be effective in controlling some or all of [name] chronic pain symptoms.  Indications: Low back pain and lower extremity pain severe enough to impact quality of life or  function. Rationale (medical necessity): procedure needed and proper for the diagnosis and/or treatment of [name] medical symptoms and needs. 1.  Chronic lower extremity pain (1ry area of Pain) (Bilateral) (L>R)   2. Chronic low back pain (2ry area of Pain) (Left) w/ sciatica (Left)   3. Chronic lumbar radicular pain (L5/S1 dermatome) (Bilateral) (L>R)   4. DDD (degenerative disc disease), lumbar   5. Failed back surgical syndrome (x 3)   6. Grade 1 Retrolisthesis of L1/L2 & L2/L3   7. 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)   8. Lumbosacral foraminal stenosis (Left: L5-S1)   9. Lumbosacral radiculopathy at L5 (Left)   10. Numbness and tingling of feet (Bilateral) (L>R)   11. History of lumbar fusion (L3-4, L4-5, and L5-S1)    History of total hip replacement, bilateral    History of allergy to radiographic contrast media    Latex precautions, history of latex allergy    NAS-11 Pain score:   Pre-procedure: 8 /10   Post-procedure: 0-No pain/10      Effectiveness:  Initial hour after procedure:   ***. Subsequent 4-6 hours post-procedure:   ***. Analgesia past initial 6 hours:   ***. Ongoing improvement:  Analgesic:  *** Function:    ***    ROM:    ***     Pharmacotherapy Assessment  Analgesic: No chronic opioid analgesics therapy prescribed by our practice. None MME/day: 0 mg/day   Monitoring: [place] PMP: PDMP reviewed during this encounter.       Pharmacotherapy: No side-effects or adverse reactions reported. Compliance: No problems identified. Effectiveness: Clinically acceptable.  No notes on file  No results found for: "CBDTHCR" No results found for: "D8THCCBX" No results found for: "D9THCCBX"  UDS:  Summary  Date Value Ref Range Status  01/30/2023 Note  Final    Comment:    ==================================================================== Compliance Drug Analysis,  [place] ==================================================================== Test                             Result       Flag       Units  Drug Present and Declared for Prescription Verification   Gabapentin                     PRESENT      EXPECTED   Tizanidine                     PRESENT      EXPECTED   Duloxetine                     PRESENT      EXPECTED  Drug Present not Declared for Prescription Verification   Citalopram                     PRESENT      UNEXPECTED   Desmethylcitalopram            PRESENT      UNEXPECTED    Desmethylcitalopram is an expected metabolite of citalopram or the    enantiomeric form, escitalopram.  ==================================================================== Test                      Result    Flag   Units      Ref Range   Creatinine              79               mg/dL      >=64 ====================================================================  Declared Medications:  The flagging and interpretation on this report are based on the  following declared medications.  Unexpected results may arise from  inaccuracies in the declared medications.   **Note: The testing scope of this panel includes these medications:   Duloxetine (Cymbalta)  Gabapentin (Neurontin)   **Note: The testing scope of this panel does not include small to  moderate amounts of these reported medications:   Tizanidine   **Note: The testing scope of this panel does not include the  following reported medications:   Albuterol  Amlodipine  Calcium  Carvedilol (Coreg)  Cholecalciferol  Omeprazole  Potassium (Klor-Con)  Rosuvastatin  Spironolactone  Sucralfate (Carafate)  Triamcinolone (Kenalog)  Valacyclovir (Valtrex)  Zinc Oxide ==================================================================== For clinical consultation, please call [phone]. ====================================================================       ROS  Constitutional: Denies any  fever or chills Gastrointestinal: No reported hemesis, hematochezia, vomiting, or acute GI distress Musculoskeletal: Denies any acute onset joint swelling, redness, loss of ROM, or weakness Neurological: No reported episodes of acute onset apraxia, aphasia, dysarthria, agnosia, amnesia, paralysis, loss of coordination, or loss of consciousness  Medication Review  DULoxetine, amLODipine, calcium carbonate, carvedilol, cholecalciferol, gabapentin, potassium chloride SA, rosuvastatin, spironolactone, and tiZANidine  History Review  Allergy: [name] is allergic to ace inhibitors, ciprofloxacin, fentanyl, hctz [hydrochlorothiazide], latex, morphine and codeine, penicillin g benzathine, isovue m [iopamidol], and tape. Drug: [name]  reports no history of drug use. Alcohol:  reports no history of alcohol use. Tobacco:  reports that she has never smoked. She has never used smokeless tobacco. Social: Ms. Ay  reports that she has never smoked. She has never used smokeless tobacco. She reports that she does not drink alcohol and does not use drugs. Medical:  has a past medical history of Acute anxiety (08/03/2015), Acute bronchitis (12/31/2016), Acute pancreatitis (10/14/2016), Altered mental status (10/12/2016), Anxiety, Bronchitis, Chronic hip pain, Chronic pain syndrome, Depression, Edema, Fatigue, Fibromyalgia, GERD (gastroesophageal reflux disease), Headache, Hypertension, Insomnia, Low back pain, Right upper quadrant abdominal pain, and Vitamin D deficiency. Surgical: Ms. Casebolt  has a past surgical history that includes Breast surgery; Back surgery; Ectopic pregnancy surgery; Hip surgery; Abdominal hysterectomy; cyst removal from wrist; Cholecystectomy (N/A, 10/16/2016); Oophorectomy; Breast biopsy (Left, 90s); and Colonoscopy with propofol (N/A, 12/22/2021). Family: family history includes Alcohol abuse in her father; Cancer in her father; Heart disease in her mother.  Laboratory  Chemistry Profile   Renal Lab Results  Component Value Date   BUN 10 03/20/2023   CREATININE 0.94 03/20/2023   BCR 14 02/05/2020   GFR 60.86 03/20/2023   GFRAA >60 07/10/2018   GFRNONAA >60 04/05/2021    Hepatic Lab Results  Component Value Date   AST 19 03/20/2023   ALT 13 03/20/2023   ALBUMIN 4.2 03/20/2023   ALKPHOS 115 03/20/2023   LIPASE 25 04/05/2021    Electrolytes Lab Results  Component Value Date   NA 136 03/20/2023   K 3.9 03/20/2023   CL 102 03/20/2023   CALCIUM 9.3 03/20/2023   MG 1.8 03/20/2023   PHOS 4.4 12/14/2020    Bone Lab Results  Component Value Date   25OHVITD1 38 01/30/2023   25OHVITD2 1.5 01/30/2023   25OHVITD3 36 01/30/2023    Inflammation (CRP: Acute Phase) (ESR: Chronic Phase) Lab Results  Component Value Date   CRP <1 01/30/2023   ESRSEDRATE 28 01/30/2023   LATICACIDVEN 1.0 10/24/2016         Note: Above Lab results reviewed.  Recent Imaging Review  DG PAIN CLINIC C-ARM 1-60 MIN NO REPORT Fluoro was used, but no Radiologist interpretation will be provided.  Please refer to "NOTES" tab for provider progress note. Note: Reviewed        Physical Exam  General appearance: Well nourished, well developed, and well hydrated. In no apparent acute distress Mental status: Alert, oriented x 3 (person, place, & time)       Respiratory: No evidence of acute respiratory distress Eyes: PERLA Vitals: There were no vitals taken for this visit. BMI: Estimated body mass index is 27.84 kg/m as calculated from the following:   Height as of 07/30/23: 6\' 1"  (1.854 m).   Weight as of 07/30/23: 211 lb (95.7 kg). Ideal: Ideal body weight: 75.4 kg (166 lb 3.6 oz) Adjusted ideal body weight: 83.5 kg (184 lb 2.2 oz)  Assessment   Diagnosis Status  1. Chronic lower extremity pain (1ry area of Pain) (Bilateral) (L>R)   2. Chronic low back pain (2ry area of Pain) (Left) w/ sciatica (Left)   3. Chronic lumbar radicular pain (L5/S1 dermatome) (Bilateral)  (L>R)   4. Lumbosacral radiculopathy at L5 (Left)   5. Numbness and tingling of feet (Bilateral) (L>R)   6. Failed back surgical syndrome (x 3)   7. Postop check    Controlled Controlled Controlled   Updated Problems: No problems updated.  Plan of Care  Problem-specific:  No problem-specific Assessment & Plan notes found for this encounter.  [name] has a current medication list which includes the following long-term medication(s): amlodipine, calcium carbonate, carvedilol, duloxetine, gabapentin, potassium chloride sa, rosuvastatin, and spironolactone.  Pharmacotherapy (Medications Ordered): No orders of the defined types were placed in this encounter.  Orders:  No orders of the defined types were placed in this encounter.  Follow-up plan:   No follow-ups on file.      Interventional Therapies  Risk Factors  Considerations:  ALLERGY: CONTRAST DYE  Fentanyl.  (Extremely poor historian) (poor intraprocedure compliance) (symptom exaggeration)  GERD  HTN  Anxiety  Memory impairment     Planned  Pending:   To Dr. Marcell Barlow for a SCS implant.   Under consideration:      Completed: (Extremely poor historian)  Diagnostic bilateral lumbar spinal cord stimulator trial implant (SCS) x1 (07/23/2023) ( Diagnostic/therapeutic left L5 + S1 TFESI x1 (02/07/2023) (100/100/40/0)  Therapeutic left caudal ESI x3 (03/05/2023) (100/80/0/0) Therapeutic left L2-3 LESI x2 (09/03/2017)  Therapeutic left L2 TFESI x2 (09/03/2017)  Therapeutic left RACZ procedure x1 (10/31/2017)  Diagnostic left lumbar facet MBB x1 (05/08/2018) (did not keep follow-up) (100/100/100/100 as of 01/30/2023)    Completed by other providers:   Diagnostic EMG/PNCV (lower extremity) (02/18/2017) by Theora Master, MD [facility] neurology) Dx: Severe, mixed, acquired polyneuropathy of the lower extremities.   Therapeutic  Palliative (PRN) options:   None established       Recent Visits Date Type  Provider Dept  07/30/23 Procedure visit Delano Metz, MD [facility]  07/25/23 Office Visit Delano Metz, MD [facility]  07/23/23 Procedure visit Delano Metz, MD [facility]  05/27/23 Office Visit Delano Metz, MD [facility]  Showing recent visits within past 90 days and meeting all other requirements Future Appointments Date Type Provider Dept  08/13/23 Appointment Delano Metz, MD [facility]  Showing future appointments within next 90 days and meeting all other requirements  I discussed the assessment and treatment plan with the patient. The patient was provided an opportunity to ask questions and  all were answered. The patient agreed with the plan and demonstrated an understanding of the instructions.  Patient advised to call back or seek an in-person evaluation if the symptoms or condition worsens.  Duration of encounter: *** minutes.  Total time on encounter, as per AMA guidelines included both the face-to-face and non-face-to-face time personally spent by the physician and/or other qualified health care professional(s) on the day of the encounter (includes time in activities that require the physician or other qualified health care professional and does not include time in activities normally performed by clinical staff). Physician's time may include the following activities when performed: Preparing to see the patient (e.g., pre-charting review of records, searching for previously ordered imaging, lab work, and nerve conduction tests) Review of prior analgesic pharmacotherapies. Reviewing PMP Interpreting ordered tests (e.g., lab work, imaging, nerve conduction tests) Performing post-procedure evaluations, including interpretation of diagnostic procedures Obtaining and/or reviewing separately obtained history Performing a medically appropriate examination and/or evaluation Counseling and educating the  patient/family/caregiver Ordering medications, tests, or procedures Referring and communicating with other health care professionals (when not separately reported) Documenting clinical information in the electronic or other health record Independently interpreting results (not separately reported) and communicating results to the patient/ family/caregiver Care coordination (not separately reported)  Note by: Oswaldo Done, MD Date: 08/13/2023; Time: 10:23 AM

## 2023-08-12 ENCOUNTER — Ambulatory Visit: Payer: Medicare HMO | Admitting: Neurosurgery

## 2023-08-12 ENCOUNTER — Encounter: Payer: Self-pay | Admitting: Neurosurgery

## 2023-08-12 VITALS — BP 114/64 | Ht 73.0 in | Wt 211.0 lb

## 2023-08-12 DIAGNOSIS — M5441 Lumbago with sciatica, right side: Secondary | ICD-10-CM

## 2023-08-12 DIAGNOSIS — G894 Chronic pain syndrome: Secondary | ICD-10-CM

## 2023-08-12 DIAGNOSIS — G8929 Other chronic pain: Secondary | ICD-10-CM

## 2023-08-12 DIAGNOSIS — M5442 Lumbago with sciatica, left side: Secondary | ICD-10-CM

## 2023-08-12 DIAGNOSIS — M4727 Other spondylosis with radiculopathy, lumbosacral region: Secondary | ICD-10-CM | POA: Diagnosis not present

## 2023-08-12 DIAGNOSIS — M059 Rheumatoid arthritis with rheumatoid factor, unspecified: Secondary | ICD-10-CM | POA: Diagnosis not present

## 2023-08-12 DIAGNOSIS — Z981 Arthrodesis status: Secondary | ICD-10-CM

## 2023-08-12 DIAGNOSIS — M5417 Radiculopathy, lumbosacral region: Secondary | ICD-10-CM

## 2023-08-12 DIAGNOSIS — M47816 Spondylosis without myelopathy or radiculopathy, lumbar region: Secondary | ICD-10-CM

## 2023-08-12 NOTE — Progress Notes (Signed)
Referring Physician:  Delano Metz, MD [address] [facility] [address] [place],  [place] [address]  Primary Physician:  Glori Luis, MD  History of Present Illness: 08/12/2023 [name] is here today with a chief complaint of back and bilateral lower extremity pain.  She has been followed closely by Dr. Laban Emperor.  She has had a recent spinal cord stimulator trial which gave her great relief.  She states that this was at least 80 to 90% successful.  She is very sad when she had to have it removed per protocol.  She has not noticed any new weakness numbness or tingling.  She states that her pain has been chronic.  She has a history of failed back syndrome and prior lumbar fusions.  She has had continued lower extremity pain and was being followed by Dr. Laban Emperor who has referred her for spinal cord stimulation.   The symptoms are causing a significant impact on the patient's life.   I have utilized the care everywhere function in epic to review the outside records available from external health systems.  Review of Systems:  Neurologic review of systems reviewed and otherwise negative unless stated above  Past Medical History: Past Medical History:  Diagnosis Date   Acute anxiety 08/03/2015   Overview:  Last Assessment & Plan:  Klonopin most helpful  Reports past taking it 1 tab am 1/2 tab midday and 1 tab pm  30 day supply faxed to pharmacy   Acute bronchitis 12/31/2016   Acute pancreatitis 10/14/2016   Altered mental status 10/12/2016   Anxiety    Bronchitis    Chronic hip pain    Chronic pain syndrome    Depression    Edema    Fatigue    Fibromyalgia    GERD (gastroesophageal reflux disease)    Headache    Hypertension    Insomnia    Low back pain    Right upper quadrant abdominal pain    Vitamin D deficiency     Past Surgical History: Past Surgical History:  Procedure Laterality Date   ABDOMINAL HYSTERECTOMY     BACK SURGERY     BREAST BIOPSY  Left 90s   benign   BREAST SURGERY     CHOLECYSTECTOMY N/A 10/16/2016   Procedure: LAPAROSCOPIC CHOLECYSTECTOMY WITH INTRAOPERATIVE CHOLANGIOGRAM;  Surgeon: Tiney Rouge III, MD;  Location: [facility] ORS;  Service: General;  Laterality: N/A;   COLONOSCOPY WITH PROPOFOL N/A 12/22/2021   Procedure: COLONOSCOPY WITH PROPOFOL;  Surgeon: Wyline Mood, MD;  Location: [facility] ENDOSCOPY;  Service: Gastroenterology;  Laterality: N/A;   cyst removal from wrist     ECTOPIC PREGNANCY SURGERY     HIP SURGERY     x4   OOPHORECTOMY      Allergies: Allergies as of 08/12/2023 - Review Complete 08/12/2023  Allergen Reaction Noted   Ace inhibitors Swelling 04/15/2016   Ciprofloxacin Itching and Rash 10/15/2016   Fentanyl Itching 01/09/2017   Hctz [hydrochlorothiazide]  01/12/2020   Latex  02/22/2016   Morphine and codeine Itching 10/23/2016   Penicillin g benzathine  08/03/2015   Isovue m [iopamidol] Itching 09/18/2017   Tape Rash 09/27/2015    Medications:  Current Outpatient Medications:    amLODipine (NORVASC) 10 MG tablet, TAKE 1 TABLET EVERY DAY, Disp: 90 tablet, Rfl: 3   calcium carbonate (OSCAL) 1500 (600 Ca) MG TABS tablet, Take 600 mg of elemental calcium by mouth daily with breakfast., Disp: , Rfl:    carvedilol (COREG) 12.5  MG tablet, TAKE 1 TABLET TWICE DAILY WITH MEALS, Disp: 180 tablet, Rfl: 3   cholecalciferol (VITAMIN D) 1000 units tablet, Take 1,000 Units by mouth daily., Disp: , Rfl:    DULoxetine (CYMBALTA) 60 MG capsule, Take 1 capsule (60 mg total) by mouth daily., Disp: 90 capsule, Rfl: 3   gabapentin (NEURONTIN) 600 MG tablet, TAKE 1 TABLET AT BEDTIME, Disp: 90 tablet, Rfl: 3   potassium chloride SA (KLOR-CON M) 20 MEQ tablet, TAKE 1 TABLET EVERY DAY, Disp: 90 tablet, Rfl: 3   rosuvastatin (CRESTOR) 20 MG tablet, TAKE 1 TABLET EVERY DAY, Disp: 90 tablet, Rfl: 0   spironolactone (ALDACTONE) 25 MG tablet, TAKE 1/2 TABLET EVERY MORNING, Disp: 45 tablet, Rfl: 10   tiZANidine (ZANAFLEX) 2 MG  tablet, TAKE 2 TABLETS AT BEDTIME AS NEEDED FOR MUSCLE SPASMS, Disp: 30 tablet, Rfl: 1  Social History: Social History   Tobacco Use   Smoking status: Never   Smokeless tobacco: Never  Vaping Use   Vaping status: Never Used  Substance Use Topics   Alcohol use: No    Alcohol/week: 0.0 standard drinks of alcohol   Drug use: No    Family Medical History: Family History  Problem Relation Age of Onset   Heart disease Mother    Cancer Father    Alcohol abuse Father     Physical Examination: Vitals:   08/12/23 1021  BP: 114/64    General: Patient is in apparent back and lower extremity pain. Attention to examination is appropriate.  Neck:   Supple.  Full range of motion.  Respiratory: Patient is breathing without any difficulty.  MSK: Evaluate the patient's back noted her previous surgical scar.  This was well-healed without evidence of dehiscence.  NEUROLOGICAL:     Awake, alert, oriented to person, place, and time.  Speech is clear and fluent.   Cranial Nerves: Pupils equal round and reactive to light.  Facial tone is symmetric.  Facial sensation is symmetric. Shoulder shrug is symmetric. Tongue protrusion is midline.    Strength:  Side Iliopsoas Quads Hamstring PF DF EHL  R 5 5 5 5 5 5   L 5 5 5 5 5 5    Reflexes are 1+ and symmetric at the biceps, triceps, brachioradialis, patella and achilles.   Hoffman's is absent. Clonus is absent  Decreased left lower extremity sensation noted.     No evidence of dysmetria noted.  Gait is antalgic  Imaging: Narrative & Impression  CLINICAL DATA:  Low back pain with left leg numbness, burning and tingling. Multiple previous back surgeries.   EXAM: MRI LUMBAR SPINE WITHOUT CONTRAST   TECHNIQUE: Multiplanar, multisequence MR imaging of the lumbar spine was performed. No intravenous contrast was administered.   COMPARISON:  Lumbar MRI 02/20/2020. Lumbar spine CT 08/03/2022 and radiographs 04/11/2023.    FINDINGS: Segmentation: Conventional anatomy assumed, with the last open disc space designated L5-S1.Concordant with the imaging.   Alignment:  Physiologic.   Vertebrae: No worrisome osseous lesion, acute fracture or pars defect. Solid interbody fusion post L3-5 posterior and interbody fusion. There are bilateral pedicle screws and rods at L5-S1 with posterior fusion at that level on previous CT. There is no endplate edema at L8-V5 to suggest abnormal motion. Mild sacroiliac degenerative changes bilaterally.   Conus medullaris: Extends to the T12-L1 level and appears normal.   Paraspinal and other soft tissues: No significant paraspinal findings. Grossly stable right renal cyst for which no follow-up imaging is recommended.   Disc levels:  T12-L1: Mildly progressive loss of disc height with annular disc bulging. No significant spinal stenosis or nerve root encroachment.   L1-2: Mildly progressive disc bulging with mild facet and ligamentous hypertrophy. No significant spinal stenosis or nerve root encroachment.   L2-3: Stable disc bulging with moderate facet and ligamentous hypertrophy. Disc height is relatively preserved. There is stable mild spinal stenosis and mild lateral recess narrowing bilaterally. The foramina remain sufficiently patent.   L3-4: The spinal canal and neural foramina remain patent post posterior and interbody fusion.   L4-5: The spinal canal and neural foramina remain patent post posterior and interbody fusion.   L5-S1: Stable postsurgical changes from previous PLIF. As before, definite interbody fusion not demonstrated. There is solid posterior fusion as correlated with previous CT. Stable left foraminal narrowing due to spur. The spinal canal and right foramen are patent.   IMPRESSION: 1. Compared with previous MRI from 02/20/2020, no acute findings or clear explanation for the patient's symptoms. 2. Stable postsurgical changes status post  L3 through S1 fusion. Stable moderate chronic left foraminal narrowing at L5-S1 due to spur. 3. Stable mild multifactorial spinal stenosis and lateral recess narrowing bilaterally at L2-3. 4. Mildly progressive disc bulging at T12-L1 and L1-2 without resulting spinal stenosis or nerve root encroachment.     Electronically Signed   By: Carey Bullocks M.D.   On: 04/16/2023 13:05      I have personally reviewed the images and agree with the above interpretation.  Medical Decision Making/Assessment and Plan: Ms. Schult is a pleasant 72 y.o. female with chronic low back pain with sciatica, lumbosacral radiculopathy, facet arthropathy and chronic pain.  She has had management of her chronic low back pain and radiating leg pain for multiple years.  She has been working with Dr. Laban Emperor.  She has had a previous psychology evaluation.  She has had prior physical therapy.  She recently underwent a spinal cord stimulator trial which she states gave her 80 to 90% improvement.  She is very sad to have it removed as she was feeling the best that she had in some time.  She was referred here for evaluation for spinal cord stimulator placement.  Given her significant relief with the previous stimulator, exhaustion of her conservative measures, and continued impact on her quality of life we would like to evaluate her for placement of a permanent stimulator.  In order to do this we have ordered an MRI of her thoracic spine to evaluate for spacing.  Once this is obtained we will reach out to patient and plan for spinal cord stimulator placement.  Thank you for involving me in the care of this patient.    Lovenia Kim MD/MSCR Neurosurgery

## 2023-08-13 ENCOUNTER — Ambulatory Visit: Payer: Medicare HMO | Attending: Pain Medicine | Admitting: Pain Medicine

## 2023-08-13 ENCOUNTER — Encounter: Payer: Self-pay | Admitting: Pain Medicine

## 2023-08-13 VITALS — BP 155/88 | HR 70 | Temp 97.8°F | Resp 18 | Ht 73.0 in | Wt 211.0 lb

## 2023-08-13 DIAGNOSIS — R200 Anesthesia of skin: Secondary | ICD-10-CM | POA: Insufficient documentation

## 2023-08-13 DIAGNOSIS — M961 Postlaminectomy syndrome, not elsewhere classified: Secondary | ICD-10-CM | POA: Diagnosis not present

## 2023-08-13 DIAGNOSIS — M79604 Pain in right leg: Secondary | ICD-10-CM | POA: Insufficient documentation

## 2023-08-13 DIAGNOSIS — M5442 Lumbago with sciatica, left side: Secondary | ICD-10-CM | POA: Diagnosis not present

## 2023-08-13 DIAGNOSIS — G8929 Other chronic pain: Secondary | ICD-10-CM | POA: Diagnosis not present

## 2023-08-13 DIAGNOSIS — R202 Paresthesia of skin: Secondary | ICD-10-CM | POA: Insufficient documentation

## 2023-08-13 DIAGNOSIS — M79605 Pain in left leg: Secondary | ICD-10-CM | POA: Diagnosis not present

## 2023-08-13 DIAGNOSIS — M5417 Radiculopathy, lumbosacral region: Secondary | ICD-10-CM | POA: Insufficient documentation

## 2023-08-13 DIAGNOSIS — Z09 Encounter for follow-up examination after completed treatment for conditions other than malignant neoplasm: Secondary | ICD-10-CM | POA: Diagnosis not present

## 2023-08-13 DIAGNOSIS — M5416 Radiculopathy, lumbar region: Secondary | ICD-10-CM | POA: Insufficient documentation

## 2023-08-13 NOTE — Progress Notes (Signed)
Safety precautions to be maintained throughout the outpatient stay will include: orient to surroundings, keep bed in low position, maintain call bell within reach at all times, provide assistance with transfer out of bed and ambulation.  

## 2023-08-19 ENCOUNTER — Other Ambulatory Visit: Payer: Self-pay | Admitting: Family

## 2023-08-19 DIAGNOSIS — M62838 Other muscle spasm: Secondary | ICD-10-CM

## 2023-08-20 ENCOUNTER — Ambulatory Visit
Admission: RE | Admit: 2023-08-20 | Discharge: 2023-08-20 | Disposition: A | Discharge status: Home / Self Care | Payer: Medicare HMO | Source: Ambulatory Visit | Attending: Neurosurgery | Admitting: Neurosurgery

## 2023-08-20 DIAGNOSIS — G8929 Other chronic pain: Secondary | ICD-10-CM | POA: Insufficient documentation

## 2023-08-20 DIAGNOSIS — M5134 Other intervertebral disc degeneration, thoracic region: Secondary | ICD-10-CM | POA: Diagnosis not present

## 2023-08-20 DIAGNOSIS — M419 Scoliosis, unspecified: Secondary | ICD-10-CM | POA: Diagnosis not present

## 2023-08-20 DIAGNOSIS — Z981 Arthrodesis status: Secondary | ICD-10-CM | POA: Insufficient documentation

## 2023-08-20 DIAGNOSIS — M5442 Lumbago with sciatica, left side: Secondary | ICD-10-CM | POA: Diagnosis not present

## 2023-08-20 DIAGNOSIS — R202 Paresthesia of skin: Secondary | ICD-10-CM | POA: Diagnosis not present

## 2023-08-27 ENCOUNTER — Telehealth: Payer: Self-pay | Admitting: Pain Medicine

## 2023-08-27 NOTE — Telephone Encounter (Signed)
Spoke with patient and told her to reach out to Neurosurgery because they will be scheduling the SCS implant.

## 2023-08-27 NOTE — Telephone Encounter (Signed)
Patient called crying stating she is in a lot of pain and hasn't heard from anyone regarding SCS. Can someone call patient please.

## 2023-09-02 ENCOUNTER — Telehealth: Payer: Self-pay | Admitting: Neurosurgery

## 2023-09-02 NOTE — Telephone Encounter (Signed)
Patient wanting to go over CT results when available.

## 2023-09-02 NOTE — Telephone Encounter (Signed)
09/05/2023 at 1pm.

## 2023-09-02 NOTE — Telephone Encounter (Signed)
Lets put on for Thursday afternoon phone call to give a few more days for the MRI read

## 2023-09-04 NOTE — Telephone Encounter (Signed)
Do you want to reschedule her telephone visit? The report is not in her chart yet.

## 2023-09-05 ENCOUNTER — Ambulatory Visit (INDEPENDENT_AMBULATORY_CARE_PROVIDER_SITE_OTHER): Payer: Medicare HMO | Admitting: Neurosurgery

## 2023-09-05 DIAGNOSIS — M47814 Spondylosis without myelopathy or radiculopathy, thoracic region: Secondary | ICD-10-CM | POA: Diagnosis not present

## 2023-09-05 DIAGNOSIS — Z981 Arthrodesis status: Secondary | ICD-10-CM

## 2023-09-05 DIAGNOSIS — G894 Chronic pain syndrome: Secondary | ICD-10-CM

## 2023-09-05 DIAGNOSIS — M5417 Radiculopathy, lumbosacral region: Secondary | ICD-10-CM

## 2023-09-05 DIAGNOSIS — G8929 Other chronic pain: Secondary | ICD-10-CM | POA: Diagnosis not present

## 2023-09-05 NOTE — Telephone Encounter (Signed)
Prescription Request  09/05/2023  LOV: Visit date not found  What is the name of the medication or equipment? tiZANidine   Have you contacted your pharmacy to request a refill? Yes   Which pharmacy would you like this sent to? [facility]   Patient notified that their request is being sent to the clinical staff for review and that they should receive a response within 2 business days.   Please advise at [facility] [phone]

## 2023-09-05 NOTE — Progress Notes (Signed)
Had a follow-up phone call today with Maria Hamilton.  She was at home and I was in the office.  She gave consent to go forward with a phone visit.  As part of this visit we discussed her findings of her thoracic MRI which showed as expected some evidence of spondylosis but she does have adequate space for a paddle stimulator.  She did state that she continues to have significant pain and symptoms and would like to go forward with a spinal cord stimulator.  She currently works with Dr. Laban Emperor.  Will plan on going forward with her spinal cord stimulator.  She has had a previous psychology evaluation, has had previous physical therapy, pain management, she has had a previous spinal stimulator trial.  MR THORACIC SPINE WO CONTRAST  Result Date: 09/04/2023 CLINICAL DATA:  Spinal fusion, thoracic, follow-up. Surgical planning for spinal stimulator. Ongoing pain with tingling and stabbing sensations in the lower body. EXAM: MRI THORACIC SPINE WITHOUT CONTRAST TECHNIQUE: Multiplanar, multisequence MR imaging of the thoracic spine was performed. No intravenous contrast was administered. COMPARISON:  Lumbar MRI 04/11/2023.  Cervical CT 06/18/2020. FINDINGS: Alignment:  Minimal scoliotic curvature.  No listhesis. Vertebrae: Old minimal superior endplate deformities at T1 and T2 without residual edema. No other thoracic region fracture. Cord:  No cord compression or focal cord lesion. Paraspinal and other soft tissues: Negative Disc levels: No significant thoracic disc pathology. Minimal non-compressive disc bulges at T2-3, T7-8, T10-11 and T12-L1. No large bulge or herniation. There is mild facet and ligamentous hypertrophy noted on the right at T5-6 and from T7-8 through T11 and at T12-L1. On the left, there is mild facet and ligamentous hypertrophy from T8-9 through T10-11. None of these appears result in pronounced encroachment upon the canal. IMPRESSION: 1. No acute or reversible finding. Old minimal superior  endplate deformities at T1 and T2. 2. No significant disc pathology. No compressive stenosis. 3. Mild facet and ligamentous hypertrophy on the right at T5-6 and from T7-8 through T11 and at T12-L1. On the left, there is mild facet and ligamentous hypertrophy from T8-9 through T10-11. None of these appears to result in pronounced encroachment upon the canal. Electronically Signed   By: Paulina Fusi M.D.   On: 09/04/2023 17:46   DG PAIN CLINIC C-ARM 1-60 MIN NO REPORT  Result Date: 07/23/2023 Fluoro was used, but no Radiologist interpretation will be provided. Please refer to "NOTES" tab for provider progress note.    We spent a total of 10 minutes discussing her case.

## 2023-09-06 ENCOUNTER — Telehealth: Payer: Self-pay

## 2023-09-06 ENCOUNTER — Other Ambulatory Visit: Payer: Self-pay

## 2023-09-06 DIAGNOSIS — G8929 Other chronic pain: Secondary | ICD-10-CM

## 2023-09-06 DIAGNOSIS — G894 Chronic pain syndrome: Secondary | ICD-10-CM

## 2023-09-06 DIAGNOSIS — Z01818 Encounter for other preprocedural examination: Secondary | ICD-10-CM

## 2023-09-06 MED ORDER — TIZANIDINE HCL 2 MG PO TABS
ORAL_TABLET | ORAL | 1 refills | Status: DC
Start: 2023-09-06 — End: 2023-10-03

## 2023-09-06 NOTE — Telephone Encounter (Signed)
Sent to [facility].  Initially sent to her mail order pharmacy.  Please call them and cancel that prescription.  Thanks.

## 2023-09-06 NOTE — Telephone Encounter (Signed)
Patty, I reset her mychart password, but please also mail her this information once we receive her pre-op appointment. I printed a copy and placed it on your desk.  Thanks!

## 2023-09-06 NOTE — Telephone Encounter (Addendum)
I spoke with Maria Hamilton. We discussed the following information:    Planned surgery: thoracic laminectomy for spinal cord stimulator ([employer])   Surgery date: 10/03/23 at [facility] (Medical Mall: [address] [place], [place] [address]) - you will find out your arrival time the business day before your surgery.    Pre-op appointment at [facility] Pre-admit Testing: we will call you with a date/time for this. If you are scheduled for an in person appointment, Pre-admit Testing is located on the first floor of the [facility] building, [address], [address]. Please bring all prescriptions in the original prescription bottles to your appointment. During this appointment, they will advise you which medications you can take the morning of surgery, and which medications you will need to hold for surgery. Labs (such as blood work, EKG) may be done at your pre-op appointment. You are not required to fast for these labs. Should you need to change your pre-op appointment, please call Pre-admit testing at [phone].      Common restrictions after surgery: No bending, lifting, or twisting ("BLT"). Avoid lifting objects heavier than 10 pounds for the first 6 weeks after surgery. Where possible, avoid household activities that involve lifting, bending, reaching, pushing, or pulling such as laundry, vacuuming, grocery shopping, and childcare. Try to arrange for help from friends and family for these activities while you heal. Do not drive while taking prescription pain medication. Weeks 6 through 12 after surgery: avoid lifting more than 25 pounds.      How to contact us:  If you have any questions/concerns before or after surgery, you can reach [place] at [phone], or you can send a mychart message. We can be reached by phone or mychart 8am-4pm, Monday-Friday.  *Please note: Calls after 4pm are forwarded to a third party answering service. Mychart  messages are not routinely monitored during evenings, weekends, and holidays. Please call our office to contact the answering service for urgent concerns during non-business hours.    Appointments/FMLA & disability paperwork: Joycelyn Rua, & Flonnie Hailstone Nurse: Royston Cowper  Medical assistants: Laurann Montana, & Lyla Son Physician Assistants: Manning Charity & Drake Leach Surgeons: Venetia Night, MD & Ernestine Mcmurray, MD

## 2023-09-09 NOTE — Telephone Encounter (Signed)
Called patient with preop appt and mailed out all the information.

## 2023-09-10 NOTE — Telephone Encounter (Signed)
Mail order was not cancelled in time. I called today & it was shipped out on yesterday. They did cancel the refill.

## 2023-09-20 ENCOUNTER — Encounter
Admission: RE | Admit: 2023-09-20 | Discharge: 2023-09-20 | Disposition: A | Discharge status: Home / Self Care | Payer: Medicare HMO | Source: Ambulatory Visit | Attending: Orthopedic Surgery | Admitting: Orthopedic Surgery

## 2023-09-20 ENCOUNTER — Other Ambulatory Visit: Payer: Self-pay

## 2023-09-20 DIAGNOSIS — Z01818 Encounter for other preprocedural examination: Secondary | ICD-10-CM | POA: Diagnosis not present

## 2023-09-20 DIAGNOSIS — Z01812 Encounter for preprocedural laboratory examination: Secondary | ICD-10-CM

## 2023-09-20 DIAGNOSIS — E876 Hypokalemia: Secondary | ICD-10-CM | POA: Diagnosis not present

## 2023-09-20 DIAGNOSIS — I10 Essential (primary) hypertension: Secondary | ICD-10-CM | POA: Insufficient documentation

## 2023-09-20 DIAGNOSIS — Z01810 Encounter for preprocedural cardiovascular examination: Secondary | ICD-10-CM | POA: Diagnosis not present

## 2023-09-20 DIAGNOSIS — I700 Atherosclerosis of aorta: Secondary | ICD-10-CM | POA: Insufficient documentation

## 2023-09-20 HISTORY — DX: Spondylosis without myelopathy or radiculopathy, lumbar region: M47.816

## 2023-09-20 HISTORY — DX: Polyneuropathy, unspecified: G62.9

## 2023-09-20 HISTORY — DX: Hypokalemia: E87.6

## 2023-09-20 HISTORY — DX: Atherosclerosis of aorta: I70.0

## 2023-09-20 LAB — TYPE AND SCREEN
ABO/RH(D): O POS
Antibody Screen: NEGATIVE

## 2023-09-20 LAB — CBC
HCT: 37.7 % (ref 36.0–46.0)
Hemoglobin: 12.5 g/dL (ref 12.0–15.0)
MCH: 28 pg (ref 26.0–34.0)
MCHC: 33.2 g/dL (ref 30.0–36.0)
MCV: 84.5 fL (ref 80.0–100.0)
Platelets: 275 10*3/uL (ref 150–400)
RBC: 4.46 MIL/uL (ref 3.87–5.11)
RDW: 13.7 % (ref 11.5–15.5)
WBC: 4.2 10*3/uL (ref 4.0–10.5)
nRBC: 0 % (ref 0.0–0.2)

## 2023-09-20 LAB — BASIC METABOLIC PANEL
Anion gap: 6 (ref 5–15)
BUN: 9 mg/dL (ref 8–23)
CO2: 27 mmol/L (ref 22–32)
Calcium: 9 mg/dL (ref 8.9–10.3)
Chloride: 104 mmol/L (ref 98–111)
Creatinine, Ser: 0.81 mg/dL (ref 0.44–1.00)
GFR, Estimated: >60 mL/min (ref >60)
Glucose, Bld: 100 mg/dL — ABNORMAL HIGH (ref 70–99)
Potassium: 3.6 mmol/L (ref 3.5–5.1)
Sodium: 137 mmol/L (ref 135–145)

## 2023-09-20 LAB — SURGICAL PCR SCREEN
MRSA, PCR: NEGATIVE
Staphylococcus aureus: NEGATIVE

## 2023-09-20 NOTE — Patient Instructions (Addendum)
Your procedure is scheduled on: Thursday, October 17 Report to the Registration Desk on the 1st floor of the [facility] To find out your arrival time, please call [phone] between 1PM - 3PM on: Wednesday, October 16 If your arrival time is 6:00 am, do not arrive before that time as the Medical Mall entrance doors do not open until 6:00 am.  REMEMBER: Instructions that are not followed completely may result in serious medical risk, up to and including death; or upon the discretion of your surgeon and anesthesiologist your surgery may need to be rescheduled.  Do not eat food after midnight the night before surgery.  No gum chewing or hard candies.  You may however, drink CLEAR liquids up to 2 hours before you are scheduled to arrive for your surgery. Do not drink anything within 2 hours of your scheduled arrival time.  Clear liquids include: - water  - apple juice without pulp - gatorade (not RED colors) - black coffee or tea (Do NOT add milk or creamers to the coffee or tea) Do NOT drink anything that is not on this list.  One week prior to surgery: starting October 10 Stop ANY OVER THE COUNTER supplements until after surgery. Stop calcium/vit.D You may however, continue to take Tylenol if needed for pain up until the day of surgery.  Continue taking all prescribed medications up until the day of surgery.  TAKE ONLY THESE MEDICATIONS THE MORNING OF SURGERY WITH A SIP OF WATER:  Amlodipine Carvedilol Duloxetine (Cymbalta) Potassium  No Alcohol for 24 hours before or after surgery.  No Smoking including e-cigarettes for 24 hours before surgery.  No chewable tobacco products for at least 6 hours before surgery.  No nicotine patches on the day of surgery.  Do not use any "recreational" drugs for at least a week (preferably 2 weeks) before your surgery.  Please be advised that the combination of cocaine and anesthesia may have negative outcomes, up to and including  death. If you test positive for cocaine, your surgery will be cancelled.  On the morning of surgery brush your teeth with toothpaste and water, you may rinse your mouth with mouthwash if you wish. Do not swallow any toothpaste or mouthwash.  Use CHG Soap as directed on instruction sheet.  Do not wear jewelry, make-up, hairpins, clips or nail polish.  For welded (permanent) jewelry: bracelets, anklets, waist bands, etc.  Please have this removed prior to surgery.  If it is not removed, there is a chance that hospital personnel will need to cut it off on the day of surgery.  Do not wear lotions, powders, or perfumes.   Do not shave body hair from the neck down 48 hours before surgery.  Contact lenses, hearing aids and dentures may not be worn into surgery.  Do not bring valuables to the hospital. [facility] is not responsible for any missing/lost belongings or valuables.   Notify your doctor if there is any change in your medical condition (cold, fever, infection).  Wear comfortable clothing (specific to your surgery type) to the hospital.  After surgery, you can help prevent lung complications by doing breathing exercises.  Take deep breaths and cough every 1-2 hours.   If you are being discharged the day of surgery, you will not be allowed to drive home. You will need a responsible individual to drive you home and stay with you for 24 hours after surgery.   If you are taking public transportation, you will need to  have a responsible individual with you.  Please call the Pre-admissions Testing Dept. at [phone] if you have any questions about these instructions.  Surgery Visitation Policy:  Patients having surgery or a procedure may have two visitors.  Children under the age of 66 must have an adult with them who is not the patient.       Pre-operative 5 CHG Bath Instructions   You can play a key role in reducing the risk of infection after surgery. Your skin needs  to be as free of germs as possible. You can reduce the number of germs on your skin by washing with CHG (chlorhexidine gluconate) soap before surgery. CHG is an antiseptic soap that kills germs and continues to kill germs even after washing.   DO NOT use if you have an allergy to chlorhexidine/CHG or antibacterial soaps. If your skin becomes reddened or irritated, stop using the CHG and notify one of our RNs at [phone].   Please shower with the CHG soap starting 4 days before surgery using the following schedule:     Please keep in mind the following:  DO NOT shave, including legs and underarms, starting the day of your first shower.   You may shave your face at any point before/day of surgery.  Place clean sheets on your bed the day you start using CHG soap. Use a clean washcloth (not used since being washed) for each shower. DO NOT sleep with pets once you start using the CHG.   CHG Shower Instructions:  If you choose to wash your hair and private area, wash first with your normal shampoo/soap.  After you use shampoo/soap, rinse your hair and body thoroughly to remove shampoo/soap residue.  Turn the water OFF and apply about 3 tablespoons (45 ml) of CHG soap to a CLEAN washcloth.  Apply CHG soap ONLY FROM YOUR NECK DOWN TO YOUR TOES (washing for 3-5 minutes)  DO NOT use CHG soap on face, private areas, open wounds, or sores.  Pay special attention to the area where your surgery is being performed.  If you are having back surgery, having someone wash your back for you may be helpful. Wait 2 minutes after CHG soap is applied, then you may rinse off the CHG soap.  Pat dry with a clean towel  Put on clean clothes/pajamas   If you choose to wear lotion, please use ONLY the CHG-compatible lotions on the back of this paper.     Additional instructions for the day of surgery: DO NOT APPLY any lotions, deodorants, cologne, or perfumes.   Put on clean/comfortable clothes.  Brush your  teeth.  Ask your nurse before applying any prescription medications to the skin.        CHG Compatible Lotions   Aveeno Moisturizing lotion  Cetaphil Moisturizing Cream  Cetaphil Moisturizing Lotion  Clairol Herbal Essence Moisturizing Lotion, Dry Skin  Clairol Herbal Essence Moisturizing Lotion, Extra Dry Skin  Clairol Herbal Essence Moisturizing Lotion, Normal Skin  Curel Age Defying Therapeutic Moisturizing Lotion with Alpha Hydroxy  Curel Extreme Care Body Lotion  Curel Soothing Hands Moisturizing Hand Lotion  Curel Therapeutic Moisturizing Cream, Fragrance-Free  Curel Therapeutic Moisturizing Lotion, Fragrance-Free  Curel Therapeutic Moisturizing Lotion, Original Formula  Eucerin Daily Replenishing Lotion  Eucerin Dry Skin Therapy Plus Alpha Hydroxy Crme  Eucerin Dry Skin Therapy Plus Alpha Hydroxy Lotion  Eucerin Original Crme  Eucerin Original Lotion  Eucerin Plus Crme Eucerin Plus Lotion  Eucerin TriLipid Replenishing Lotion  Keri Anti-Bacterial  Hand Lotion  Keri Deep Conditioning Original Lotion Dry Skin Formula Softly Scented  Keri Deep Conditioning Original Lotion, Fragrance Free Sensitive Skin Formula  Keri Lotion Fast Absorbing Fragrance Free Sensitive Skin Formula  Keri Lotion Fast Absorbing Softly Scented Dry Skin Formula  Keri Original Lotion  Keri Skin Renewal Lotion Keri Silky Smooth Lotion  Keri Silky Smooth Sensitive Skin Lotion  Nivea Body Creamy Conditioning Oil  Nivea Body Extra Enriched Teacher, adult education Moisturizing Lotion Nivea Crme  Nivea Skin Firming Lotion  NutraDerm 30 Skin Lotion  NutraDerm Skin Lotion  NutraDerm Therapeutic Skin Cream  NutraDerm Therapeutic Skin Lotion  ProShield Protective Hand Cream  Provon moisturizing lotion

## 2023-10-03 ENCOUNTER — Other Ambulatory Visit: Payer: Self-pay

## 2023-10-03 ENCOUNTER — Ambulatory Visit
Admission: RE | Admit: 2023-10-03 | Discharge: 2023-10-03 | Disposition: A | Discharge status: Home / Self Care | Payer: Medicare HMO | Attending: Neurosurgery | Admitting: Neurosurgery

## 2023-10-03 ENCOUNTER — Ambulatory Visit: Payer: Medicare HMO | Admitting: Urgent Care

## 2023-10-03 ENCOUNTER — Encounter
Admission: RE | Disposition: A | Discharge status: Home / Self Care | Payer: Self-pay | Source: Home / Self Care | Attending: Neurosurgery

## 2023-10-03 ENCOUNTER — Encounter: Payer: Self-pay | Admitting: Neurosurgery

## 2023-10-03 ENCOUNTER — Ambulatory Visit: Payer: Self-pay | Admitting: Certified Registered Nurse Anesthetist

## 2023-10-03 ENCOUNTER — Ambulatory Visit: Payer: Medicare HMO

## 2023-10-03 DIAGNOSIS — M545 Low back pain, unspecified: Secondary | ICD-10-CM

## 2023-10-03 DIAGNOSIS — E876 Hypokalemia: Secondary | ICD-10-CM

## 2023-10-03 DIAGNOSIS — I10 Essential (primary) hypertension: Secondary | ICD-10-CM | POA: Diagnosis not present

## 2023-10-03 DIAGNOSIS — M5442 Lumbago with sciatica, left side: Secondary | ICD-10-CM | POA: Diagnosis not present

## 2023-10-03 DIAGNOSIS — M79604 Pain in right leg: Secondary | ICD-10-CM | POA: Diagnosis not present

## 2023-10-03 DIAGNOSIS — Z01818 Encounter for other preprocedural examination: Secondary | ICD-10-CM

## 2023-10-03 DIAGNOSIS — M79605 Pain in left leg: Secondary | ICD-10-CM | POA: Diagnosis not present

## 2023-10-03 DIAGNOSIS — Z4542 Encounter for adjustment and management of neuropacemaker (brain) (peripheral nerve) (spinal cord): Secondary | ICD-10-CM | POA: Diagnosis not present

## 2023-10-03 DIAGNOSIS — G894 Chronic pain syndrome: Secondary | ICD-10-CM | POA: Diagnosis not present

## 2023-10-03 DIAGNOSIS — G8929 Other chronic pain: Secondary | ICD-10-CM | POA: Diagnosis not present

## 2023-10-03 DIAGNOSIS — F418 Other specified anxiety disorders: Secondary | ICD-10-CM | POA: Insufficient documentation

## 2023-10-03 DIAGNOSIS — I700 Atherosclerosis of aorta: Secondary | ICD-10-CM

## 2023-10-03 DIAGNOSIS — Z01812 Encounter for preprocedural laboratory examination: Secondary | ICD-10-CM

## 2023-10-03 DIAGNOSIS — M961 Postlaminectomy syndrome, not elsewhere classified: Secondary | ICD-10-CM | POA: Diagnosis not present

## 2023-10-03 DIAGNOSIS — Z981 Arthrodesis status: Secondary | ICD-10-CM | POA: Insufficient documentation

## 2023-10-03 HISTORY — PX: THORACIC LAMINECTOMY FOR SPINAL CORD STIMULATOR: SHX6887

## 2023-10-03 LAB — ABO/RH: ABO/RH(D): O POS

## 2023-10-03 SURGERY — THORACIC LAMINECTOMY FOR SPINAL CORD STIMULATOR
Anesthesia: General

## 2023-10-03 MED ORDER — FENTANYL CITRATE (PF) 100 MCG/2ML IJ SOLN
INTRAMUSCULAR | Status: DC | PRN
  Administered 2023-10-03 (×2): 25 ug via INTRAVENOUS
  Administered 2023-10-03: 50 ug via INTRAVENOUS

## 2023-10-03 MED ORDER — SUCCINYLCHOLINE CHLORIDE 200 MG/10ML IV SOSY
PREFILLED_SYRINGE | INTRAVENOUS | Status: DC | PRN
  Administered 2023-10-03: 100 mg via INTRAVENOUS

## 2023-10-03 MED ORDER — LACTATED RINGERS IV SOLN
10.0000 mL/h | INTRAVENOUS | Status: DC
  Administered 2023-10-03: 10 mL/h via INTRAVENOUS

## 2023-10-03 MED ORDER — REMIFENTANIL HCL 1 MG IV SOLR
INTRAVENOUS | Status: DC | PRN
  Administered 2023-10-03: .1 ug/kg/min via INTRAVENOUS

## 2023-10-03 MED ORDER — VANCOMYCIN HCL IN DEXTROSE 1-5 GM/200ML-% IV SOLN
1000.0000 mg | Freq: Once | INTRAVENOUS | Status: AC
  Administered 2023-10-03: 1000 mg via INTRAVENOUS

## 2023-10-03 MED ORDER — BUPIVACAINE-EPINEPHRINE (PF) 0.5% -1:200000 IJ SOLN
INTRAMUSCULAR | Status: AC
  Filled 2023-10-03: qty 20

## 2023-10-03 MED ORDER — DEXAMETHASONE SODIUM PHOSPHATE 10 MG/ML IJ SOLN
INTRAMUSCULAR | Status: DC | PRN
  Administered 2023-10-03: 10 mg via INTRAVENOUS

## 2023-10-03 MED ORDER — BUPIVACAINE LIPOSOME 1.3 % IJ SUSP
INTRAMUSCULAR | Status: AC
  Filled 2023-10-03: qty 20

## 2023-10-03 MED ORDER — EPHEDRINE SULFATE-NACL 50-0.9 MG/10ML-% IV SOSY
PREFILLED_SYRINGE | INTRAVENOUS | Status: DC | PRN
  Administered 2023-10-03 (×2): 5 mg via INTRAVENOUS

## 2023-10-03 MED ORDER — DIPHENHYDRAMINE HCL 50 MG/ML IJ SOLN
25.0000 mg | Freq: Once | INTRAMUSCULAR | Status: AC
  Administered 2023-10-03: 25 mg via INTRAVENOUS

## 2023-10-03 MED ORDER — SODIUM CHLORIDE (PF) 0.9 % IJ SOLN
INTRAMUSCULAR | Status: DC | PRN
  Administered 2023-10-03: 60 mL via INTRAMUSCULAR

## 2023-10-03 MED ORDER — OXYCODONE HCL 5 MG/5ML PO SOLN: 5.0000 mg | Freq: Once | ORAL | Status: DC | PRN

## 2023-10-03 MED ORDER — DIPHENHYDRAMINE HCL 50 MG/ML IJ SOLN
50.0000 mg | Freq: Once | INTRAMUSCULAR | Status: AC
  Administered 2023-10-03: 50 mg via INTRAVENOUS

## 2023-10-03 MED ORDER — MIDAZOLAM HCL 2 MG/2ML IJ SOLN
INTRAMUSCULAR | Status: AC
  Filled 2023-10-03: qty 2

## 2023-10-03 MED ORDER — FAMOTIDINE 20 MG PO TABS
ORAL_TABLET | ORAL | Status: AC
  Filled 2023-10-03: qty 1

## 2023-10-03 MED ORDER — OXYCODONE HCL 5 MG PO TABS: 5.0000 mg | ORAL_TABLET | Freq: Once | ORAL | Status: DC | PRN

## 2023-10-03 MED ORDER — ONDANSETRON HCL 4 MG/2ML IJ SOLN
INTRAMUSCULAR | Status: AC
  Filled 2023-10-03: qty 2

## 2023-10-03 MED ORDER — DEXAMETHASONE SODIUM PHOSPHATE 10 MG/ML IJ SOLN
INTRAMUSCULAR | Status: AC
  Filled 2023-10-03: qty 1

## 2023-10-03 MED ORDER — 0.9 % SODIUM CHLORIDE (POUR BTL) OPTIME
TOPICAL | Status: DC | PRN
  Administered 2023-10-03: 500 mL

## 2023-10-03 MED ORDER — MIDAZOLAM HCL 2 MG/2ML IJ SOLN
INTRAMUSCULAR | Status: DC | PRN
  Administered 2023-10-03: .5 mg via INTRAVENOUS

## 2023-10-03 MED ORDER — PROPOFOL 1000 MG/100ML IV EMUL
INTRAVENOUS | Status: AC
  Filled 2023-10-03: qty 100

## 2023-10-03 MED ORDER — ORAL CARE MOUTH RINSE: 15.0000 mL | Freq: Once | OROMUCOSAL | Status: AC

## 2023-10-03 MED ORDER — CEFAZOLIN SODIUM-DEXTROSE 2-4 GM/100ML-% IV SOLN
INTRAVENOUS | Status: AC
  Filled 2023-10-03: qty 100

## 2023-10-03 MED ORDER — LIDOCAINE HCL (CARDIAC) PF 100 MG/5ML IV SOSY
PREFILLED_SYRINGE | INTRAVENOUS | Status: DC | PRN
  Administered 2023-10-03: 80 mg via INTRAVENOUS

## 2023-10-03 MED ORDER — REMIFENTANIL HCL 1 MG IV SOLR
INTRAVENOUS | Status: AC
  Filled 2023-10-03: qty 1000

## 2023-10-03 MED ORDER — ONDANSETRON HCL 4 MG/2ML IJ SOLN
4.0000 mg | Freq: Once | INTRAMUSCULAR | Status: AC
  Administered 2023-10-03: 4 mg via INTRAVENOUS

## 2023-10-03 MED ORDER — BUPIVACAINE-EPINEPHRINE (PF) 0.5% -1:200000 IJ SOLN
INTRAMUSCULAR | Status: DC | PRN
  Administered 2023-10-03: 17 mL via PERINEURAL

## 2023-10-03 MED ORDER — FENTANYL CITRATE (PF) 100 MCG/2ML IJ SOLN
INTRAMUSCULAR | Status: AC
  Filled 2023-10-03: qty 2

## 2023-10-03 MED ORDER — PROPOFOL 10 MG/ML IV BOLUS
INTRAVENOUS | Status: AC
  Filled 2023-10-03: qty 20

## 2023-10-03 MED ORDER — CHLORHEXIDINE GLUCONATE 0.12 % MT SOLN
15.0000 mL | Freq: Once | OROMUCOSAL | Status: AC
  Administered 2023-10-03: 15 mL via OROMUCOSAL

## 2023-10-03 MED ORDER — SODIUM CHLORIDE FLUSH 0.9 % IV SOLN
INTRAVENOUS | Status: AC
  Filled 2023-10-03: qty 20

## 2023-10-03 MED ORDER — SUCCINYLCHOLINE CHLORIDE 200 MG/10ML IV SOSY
PREFILLED_SYRINGE | INTRAVENOUS | Status: AC
  Filled 2023-10-03: qty 10

## 2023-10-03 MED ORDER — VANCOMYCIN HCL IN DEXTROSE 1-5 GM/200ML-% IV SOLN
INTRAVENOUS | Status: AC
  Filled 2023-10-03: qty 200

## 2023-10-03 MED ORDER — CHLORHEXIDINE GLUCONATE 0.12 % MT SOLN
OROMUCOSAL | Status: AC
  Filled 2023-10-03: qty 15

## 2023-10-03 MED ORDER — PROPOFOL 500 MG/50ML IV EMUL
INTRAVENOUS | Status: DC | PRN
Start: 2023-10-03 — End: 2023-10-03
  Administered 2023-10-03: 125 ug/kg/min via INTRAVENOUS

## 2023-10-03 MED ORDER — PHENYLEPHRINE HCL-NACL 20-0.9 MG/250ML-% IV SOLN
INTRAVENOUS | Status: DC | PRN
Start: 2023-10-03 — End: 2023-10-03
  Administered 2023-10-03: 25 ug/min via INTRAVENOUS

## 2023-10-03 MED ORDER — METHOCARBAMOL 500 MG PO TABS: 500.0000 mg | ORAL_TABLET | Freq: Three times a day (TID) | ORAL | 0 refills | Status: DC | PRN

## 2023-10-03 MED ORDER — DIPHENHYDRAMINE HCL 50 MG/ML IJ SOLN
INTRAMUSCULAR | Status: AC
  Filled 2023-10-03: qty 1

## 2023-10-03 MED ORDER — PHENYLEPHRINE HCL-NACL 20-0.9 MG/250ML-% IV SOLN
INTRAVENOUS | Status: AC
  Filled 2023-10-03: qty 250

## 2023-10-03 MED ORDER — NALBUPHINE HCL 10 MG/ML IJ SOLN
5.0000 mg | Freq: Once | INTRAMUSCULAR | Status: AC
  Administered 2023-10-03: 5 mg via INTRAVENOUS
  Filled 2023-10-03: qty 0.5

## 2023-10-03 MED ORDER — LIDOCAINE HCL (PF) 2 % IJ SOLN
INTRAMUSCULAR | Status: AC
  Filled 2023-10-03: qty 5

## 2023-10-03 MED ORDER — PROPOFOL 10 MG/ML IV BOLUS
INTRAVENOUS | Status: DC | PRN
  Administered 2023-10-03: 150 mg via INTRAVENOUS

## 2023-10-03 MED ORDER — ONDANSETRON HCL 4 MG/2ML IJ SOLN
INTRAMUSCULAR | Status: DC | PRN
  Administered 2023-10-03: 4 mg via INTRAVENOUS

## 2023-10-03 MED ORDER — BUPIVACAINE HCL (PF) 0.5 % IJ SOLN
INTRAMUSCULAR | Status: AC
  Filled 2023-10-03: qty 30

## 2023-10-03 MED ORDER — CEFAZOLIN SODIUM-DEXTROSE 2-4 GM/100ML-% IV SOLN
2.0000 g | Freq: Once | INTRAVENOUS | Status: AC
  Administered 2023-10-03: 2 g via INTRAVENOUS

## 2023-10-03 MED ORDER — BUPIVACAINE-EPINEPHRINE (PF) 0.5% -1:200000 IJ SOLN
INTRAMUSCULAR | Status: AC
  Filled 2023-10-03: qty 10

## 2023-10-03 MED ORDER — OXYCODONE HCL 5 MG PO TABS
5.0000 mg | ORAL_TABLET | ORAL | 0 refills | Status: DC | PRN
Start: 2023-10-03 — End: 2023-10-04

## 2023-10-03 MED ORDER — FAMOTIDINE 20 MG PO TABS
20.0000 mg | ORAL_TABLET | Freq: Once | ORAL | Status: AC
  Administered 2023-10-03: 20 mg via ORAL

## 2023-10-03 SURGICAL SUPPLY — 53 items
ADH SKN CLS APL DERMABOND .7 (GAUZE/BANDAGES/DRESSINGS) ×2
AGENT HMST KT MTR STRL THRMB (HEMOSTASIS) ×1
BELT PT INTERSTIM MICRO SYSTEM (MISCELLANEOUS) IMPLANT
BELT SPRT LRG NS LF REUSE (MISCELLANEOUS) ×1
BUR NEURO DRILL SOFT 3.0X3.8M (BURR) ×1 IMPLANT
CONTROLLER HANDSET COMM KIT (NEUROSURGERY SUPPLIES) IMPLANT
DERMABOND ADVANCED .7 DNX12 (GAUZE/BANDAGES/DRESSINGS) ×2 IMPLANT
DRAPE C ARM PK CFD 31 SPINE (DRAPES) ×1 IMPLANT
DRAPE LAPAROTOMY 100X77 ABD (DRAPES) ×1 IMPLANT
DRSG OPSITE POSTOP 4X6 (GAUZE/BANDAGES/DRESSINGS) IMPLANT
DRSG OPSITE POSTOP 4X8 (GAUZE/BANDAGES/DRESSINGS) IMPLANT
ELECT REM PT RETURN 9FT ADLT (ELECTROSURGICAL) ×1
ELECTRODE REM PT RTRN 9FT ADLT (ELECTROSURGICAL) ×1 IMPLANT
ENVELOPE ABSORB ANTIBACTERIAL (Mesh General) ×1 IMPLANT
FEE INTRAOP CADWELL SUPPLY NCS (MISCELLANEOUS) IMPLANT
FEE INTRAOP MONITOR IMPULS NCS (MISCELLANEOUS) IMPLANT
GLOVE BIOGEL PI IND STRL 6.5 (GLOVE) ×1 IMPLANT
GLOVE SURG SYN 6.5 ES PF (GLOVE) ×1 IMPLANT
GLOVE SURG SYN 6.5 PF PI (GLOVE) ×1 IMPLANT
GLOVE SURG SYN 8.5 E (GLOVE) ×3 IMPLANT
GLOVE SURG SYN 8.5 PF PI (GLOVE) ×3 IMPLANT
GOWN SRG LRG LVL 4 IMPRV REINF (GOWNS) ×1 IMPLANT
GOWN SRG XL LVL 3 NONREINFORCE (GOWNS) ×1 IMPLANT
GOWN STRL NON-REIN TWL XL LVL3 (GOWNS) ×1
GOWN STRL REIN LRG LVL4 (GOWNS) ×1
INTRAOP CADWELL SUPPLY FEE NCS (MISCELLANEOUS) ×1
INTRAOP DISP SUPPLY FEE NCS (MISCELLANEOUS) ×1
INTRAOP MONITOR FEE IMPULS NCS (MISCELLANEOUS) ×1
JET LAVAGE IRRISEPT WOUND (IRRIGATION / IRRIGATOR) ×1
KIT SPINAL PRONEVIEW (KITS) ×1 IMPLANT
LAVAGE JET IRRISEPT WOUND (IRRIGATION / IRRIGATOR) ×1 IMPLANT
MANIFOLD NEPTUNE II (INSTRUMENTS) ×1 IMPLANT
MARKER SKIN DUAL TIP RULER LAB (MISCELLANEOUS) ×1 IMPLANT
NDL SAFETY ECLIPSE 18X1.5 (NEEDLE) ×1 IMPLANT
NEUROSTIM INCEPTIV (Neuro Prosthesis/Implant) IMPLANT
NEUROSTIMULATOR INCEPTIV (Neuro Prosthesis/Implant) ×1 IMPLANT
NS IRRIG 1000ML POUR BTL (IV SOLUTION) ×1 IMPLANT
NS IRRIG 500ML POUR BTL (IV SOLUTION) IMPLANT
PACK LAMINECTOMY ARMC (PACKS) ×1 IMPLANT
POUCH TYRX ANTIBAC NEURO MED (Mesh General) IMPLANT
PROGRAMMER AND COMM CASE (NEUROSURGERY SUPPLIES) IMPLANT
RECHARGER SYSTEM (NEUROSURGERY SUPPLIES) IMPLANT
STAPLER SKIN PROX 35W (STAPLE) ×1 IMPLANT
STIMULATOR CORD SURESCAN MRI (Stimulator) IMPLANT
SURGIFLO W/THROMBIN 8M KIT (HEMOSTASIS) ×1 IMPLANT
SUT DVC VLOC 3-0 CL 6 P-12 (SUTURE) ×1 IMPLANT
SUT SILK 2 0SH CR/8 30 (SUTURE) ×1 IMPLANT
SUT VIC AB 0 CT1 18XCR BRD 8 (SUTURE) ×1 IMPLANT
SUT VIC AB 0 CT1 8-18 (SUTURE) ×1
SUT VIC AB 2-0 CT1 18 (SUTURE) ×1 IMPLANT
SYR 10ML LL (SYRINGE) ×1 IMPLANT
SYR 30ML LL (SYRINGE) ×2 IMPLANT
TRAP FLUID SMOKE EVACUATOR (MISCELLANEOUS) ×1 IMPLANT

## 2023-10-03 NOTE — Anesthesia Procedure Notes (Signed)
Procedure Name: Intubation Date/Time: 10/03/2023 7:33 AM  Performed by: Lanell Matar, CRNAPre-anesthesia Checklist: Patient identified, Emergency Drugs available, Suction available and Patient being monitored Patient Re-evaluated:Patient Re-evaluated prior to induction Oxygen Delivery Method: Circle System Utilized Preoxygenation: Pre-oxygenation with 100% oxygen Induction Type: IV induction Ventilation: Mask ventilation without difficulty Laryngoscope Size: McGraph and 4 Grade View: Grade I Tube type: Oral Tube size: 7.0 mm Number of attempts: 1 Airway Equipment and Method: Stylet and Oral airway Placement Confirmation: ETT inserted through vocal cords under direct vision, positive ETCO2 and breath sounds checked- equal and bilateral Secured at: 21 cm Tube secured with: Tape Dental Injury: Teeth and Oropharynx as per pre-operative assessment

## 2023-10-03 NOTE — Transfer of Care (Signed)
Immediate Anesthesia Transfer of Care Note  [name]  Procedure(s) Performed: THORACIC LAMINECTOMY FOR SPINAL CORD STIMULATOR PLACEMENT (MEDTRONIC)  Patient Location: PACU  Anesthesia Type:General  Level of Consciousness: awake, drowsy, and patient cooperative  Airway & Oxygen Therapy: Patient Spontanous Breathing and Patient connected to face mask oxygen  Post-op Assessment: Report given to RN, Post -op Vital signs reviewed and stable, and Patient moving all extremities X 4  Post vital signs: Reviewed and stable  Last Vitals:  Vitals Value Taken Time  BP 126/96 10/03/23 1002  Temp 36 C 10/03/23 1002  Pulse 105 10/03/23 1005  Resp 11 10/03/23 1006  SpO2 100 % 10/03/23 1005  Vitals shown include unfiled device data.  Last Pain:  Vitals:   10/03/23 0617  PainSc: 0-No pain         Complications: No notable events documented.

## 2023-10-03 NOTE — Anesthesia Preprocedure Evaluation (Signed)
Anesthesia Evaluation  Patient identified by MRN, date of birth, ID band Patient awake    Reviewed: Allergy & Precautions, NPO status , Patient's Chart, lab work & pertinent test results  Airway Mallampati: II  TM Distance: >3 FB Neck ROM: Full    Dental  (+) Upper Dentures   Pulmonary neg pulmonary ROS   Pulmonary exam normal breath sounds clear to auscultation       Cardiovascular hypertension, On Medications negative cardio ROS Normal cardiovascular exam Rhythm:Regular Rate:Normal  HLD   Neuro/Psych  Headaches PSYCHIATRIC DISORDERS Anxiety Depression    negative neurological ROS  negative psych ROS   GI/Hepatic negative GI ROS, Neg liver ROS,GERD  ,,  Endo/Other  negative endocrine ROS    Renal/GU negative Renal ROS  negative genitourinary   Musculoskeletal   Abdominal   Peds  Hematology negative hematology ROS (+)   Anesthesia Other Findings Past Medical History: 08/03/2015: Acute anxiety     Comment:  Overview:  Last Assessment & Plan:  Klonopin most               helpful  Reports past taking it 1 tab am 1/2 tab midday               and 1 tab pm  30 day supply faxed to pharmacy 12/31/2016: Acute bronchitis 10/14/2016: Acute pancreatitis 10/12/2016: Altered mental status No date: Anxiety No date: Aortic atherosclerosis (HCC) No date: Bronchitis No date: Chronic hip pain No date: Chronic pain syndrome No date: Depression No date: Edema No date: Fatigue No date: Fibromyalgia No date: GERD (gastroesophageal reflux disease) No date: Headache No date: Hypertension No date: Hypokalemia No date: Insomnia No date: Low back pain No date: Lumbar facet arthropathy No date: Polyneuropathy No date: Right upper quadrant abdominal pain No date: Vitamin D deficiency  Past Surgical History: No date: BACK SURGERY     Comment:  x 5 (lumbar) 90s: BREAST BIOPSY; Left     Comment:  benign No date: CATARACT  EXTRACTION, BILATERAL 10/16/2016: CHOLECYSTECTOMY; N/A     Comment:  Procedure: LAPAROSCOPIC CHOLECYSTECTOMY WITH               INTRAOPERATIVE CHOLANGIOGRAM;  Surgeon: Tiney Rouge III,               MD;  Location: [facility] ORS;  Service: General;  Laterality:               N/A; 12/22/2021: COLONOSCOPY WITH PROPOFOL; N/A     Comment:  Procedure: COLONOSCOPY WITH PROPOFOL;  Surgeon: Wyline Mood, MD;  Location: [facility] ENDOSCOPY;  Service:               Gastroenterology;  Laterality: N/A; No date: cyst removal from wrist No date: ECTOPIC PREGNANCY SURGERY No date: LAPAROSCOPIC VAGINAL HYSTERECTOMY WITH SALPINGO OOPHORECTOMY No date: LUMBAR FUSION     Comment:  L3-4;  L4-5 No date: TOTAL HIP ARTHROPLASTY; Bilateral No date: TOTAL HIP REVISION; Left  BMI    Body Mass Index: 26.91 kg/m      Reproductive/Obstetrics negative OB ROS                             Anesthesia Physical Anesthesia Plan  ASA: 2  Anesthesia Plan: General ETT and General   Post-op Pain Management:    Induction: Intravenous  PONV Risk Score and Plan: 3 and  Ondansetron, Dexamethasone, Midazolam, TIVA and Propofol infusion  Airway Management Planned: Oral ETT  Additional Equipment:   Intra-op Plan:   Post-operative Plan: Extubation in OR  Informed Consent: I have reviewed the patients History and Physical, chart, labs and discussed the procedure including the risks, benefits and alternatives for the proposed anesthesia with the patient or authorized representative who has indicated his/her understanding and acceptance.     Dental Advisory Given  Plan Discussed with: Anesthesiologist, CRNA and Surgeon  Anesthesia Plan Comments: (Patient consented for risks of anesthesia including but not limited to:  - adverse reactions to medications - damage to eyes, teeth, lips or other oral mucosa - nerve damage due to positioning  - sore throat or hoarseness - Damage to heart,  brain, nerves, lungs, other parts of body or loss of life  Patient voiced understanding and assent.)       Anesthesia Quick Evaluation

## 2023-10-03 NOTE — Interval H&P Note (Signed)
History and Physical Interval Note:  10/03/2023 7:20 AM  Maria Hamilton  has presented today for surgery, with the diagnosis of M54.42, G89.29  Chronic left-sided low back pain with left-sided sciatica G89.4 Chronic pain syndrome.  The various methods of treatment have been discussed with the patient and family. After consideration of risks, benefits and other options for treatment, the patient has consented to  Procedure(s): THORACIC LAMINECTOMY FOR SPINAL CORD STIMULATOR PLACEMENT (MEDTRONIC) (N/A) as a surgical intervention.  The patient's history has been reviewed, patient examined, no change in status, stable for surgery.  I have reviewed the patient's chart and labs.  Questions were answered to the patient's satisfaction.    Heart and lungs were clear to auscultation bilaterally.  Lovenia Kim

## 2023-10-03 NOTE — Discharge Summary (Signed)
Discharge Summary  Patient ID: Maria Hamilton MRN: [id number] DOB/AGE: 1951/04/01 72 y.o.  Admit date: 10/03/2023 Discharge date: 10/03/2023  Admission Diagnoses:  M54.42, G89.29  Chronic left-sided low back pain with left-sided sciatica G89.4 Chronic pain syndrome. Discharge Diagnoses:  Active Problems:   Chronic low back pain (2ry area of Pain) (Left) w/ sciatica (Left)   Chronic pain syndrome   Chronic lower extremity pain (1ry area of Pain) (Bilateral) (L>R)   Failed back surgical syndrome (x 3)   Chronic lumbar radicular pain (L5/S1 dermatome) (Bilateral) (L>R)   History of lumbar spinal fusion   Discharged Condition: good  Hospital Course:  [name] is a 72 y.o presenting with chronic pain s/p SCS placement. Intraoperative course was uncomplicated. She was monitored in PACU and discharged home after ambulating, urinating, and tolerating PO intake.  Consults: None  Significant Diagnostic Studies: none  Treatments: surgery: as above. Please see separately dictated operative report for further details   Discharge Exam: Blood pressure (!) 172/91, pulse 73, temperature 97.7 F (36.5 C), resp. rate 16, height 6\' 1"  (1.854 m), weight 92.5 kg, SpO2 100%. A&O 5/5 throughout Incisions covered with clean post-op dressings   Disposition: Discharge disposition: 01-Home or Self Care        Allergies as of 10/03/2023       Reactions   Ace Inhibitors Swelling   Angioedema   Ciprofloxacin Itching, Rash   Fentanyl Itching   Hctz [hydrochlorothiazide]    hypoK    Latex Itching   Morphine And Codeine Itching   Isovue M [iopamidol] Itching   Pt was given of Isovue 370 for CT Scan today @ 11:10am. After injection she started itching all over. We gave her 50mG  of Benadryl IV.    Penicillin G Benzathine Rash   Tape Rash        Medication List     STOP taking these medications    tiZANidine 2 MG tablet Commonly known as: ZANAFLEX       TAKE  these medications    amLODipine 10 MG tablet Commonly known as: NORVASC TAKE 1 TABLET EVERY DAY   Calcium 600/Vitamin D3 600-20 MG-MCG Tabs Generic drug: Calcium Carb-Cholecalciferol Take 1 tablet by mouth daily.   carvedilol 12.5 MG tablet Commonly known as: COREG TAKE 1 TABLET TWICE DAILY WITH MEALS   DULoxetine 60 MG capsule Commonly known as: CYMBALTA Take 1 capsule (60 mg total) by mouth daily.   gabapentin 600 MG tablet Commonly known as: NEURONTIN TAKE 1 TABLET AT BEDTIME   methocarbamol 500 MG tablet Commonly known as: ROBAXIN Take 1 tablet (500 mg total) by mouth 3 (three) times daily as needed for muscle spasms.   oxyCODONE 5 MG immediate release tablet Commonly known as: Roxicodone Take 1 tablet (5 mg total) by mouth every 4 (four) hours as needed for up to 5 days for severe pain (pain score 7-10).   potassium chloride SA 20 MEQ tablet Commonly known as: KLOR-CON M TAKE 1 TABLET EVERY DAY   spironolactone 25 MG tablet Commonly known as: ALDACTONE TAKE 1/2 TABLET EVERY MORNING         Signed: Susanne Borders 10/03/2023, 9:51 AM

## 2023-10-03 NOTE — H&P (Signed)
Referring Physician:  Lovenia Kim, MD [address] [place],  [place] [address]  Primary Physician:  Glori Luis, MD  History of Present Illness: 10/03/2023 Maria Hamilton is here today with a chief complaint of back and bilateral lower extremity pain.  She has been followed closely by Dr. Laban Emperor.  She has had a recent spinal cord stimulator trial which gave her great relief.  She states that this was at least 80 to 90% successful.  She is very sad when she had to have it removed per protocol.  She has not noticed any new weakness numbness or tingling.  She states that her pain has been chronic.  She has a history of failed back syndrome and prior lumbar fusions.  She has had continued lower extremity pain and was being followed by Dr. Laban Emperor who has referred her for spinal cord stimulation.   The symptoms are causing a significant impact on the patient's life.   I have utilized the care everywhere function in epic to review the outside records available from external health systems.  Review of Systems:  Neurologic review of systems reviewed and otherwise negative unless stated above  Past Medical History: Past Medical History:  Diagnosis Date   Acute anxiety 08/03/2015   Overview:  Last Assessment & Plan:  Klonopin most helpful  Reports past taking it 1 tab am 1/2 tab midday and 1 tab pm  30 day supply faxed to pharmacy   Acute bronchitis 12/31/2016   Acute pancreatitis 10/14/2016   Altered mental status 10/12/2016   Anxiety    Aortic atherosclerosis (HCC)    Bronchitis    Chronic hip pain    Chronic pain syndrome    Depression    Edema    Fatigue    Fibromyalgia    GERD (gastroesophageal reflux disease)    Headache    Hypertension    Hypokalemia    Insomnia    Low back pain    Lumbar facet arthropathy    Polyneuropathy    Right upper quadrant abdominal pain    Vitamin D deficiency     Past Surgical History: Past Surgical History:   Procedure Laterality Date   BACK SURGERY     x 5 (lumbar)   BREAST BIOPSY Left 90s   benign   CATARACT EXTRACTION, BILATERAL     CHOLECYSTECTOMY N/A 10/16/2016   Procedure: LAPAROSCOPIC CHOLECYSTECTOMY WITH INTRAOPERATIVE CHOLANGIOGRAM;  Surgeon: Tiney Rouge III, MD;  Location: [facility] ORS;  Service: General;  Laterality: N/A;   COLONOSCOPY WITH PROPOFOL N/A 12/22/2021   Procedure: COLONOSCOPY WITH PROPOFOL;  Surgeon: Wyline Mood, MD;  Location: [facility] ENDOSCOPY;  Service: Gastroenterology;  Laterality: N/A;   cyst removal from wrist     ECTOPIC PREGNANCY SURGERY     LAPAROSCOPIC VAGINAL HYSTERECTOMY WITH SALPINGO OOPHORECTOMY     LUMBAR FUSION     L3-4;  L4-5   TOTAL HIP ARTHROPLASTY Bilateral    TOTAL HIP REVISION Left     Allergies: Allergies as of 09/06/2023 - Review Complete 08/13/2023  Allergen Reaction Noted   Ace inhibitors Swelling 04/15/2016   Ciprofloxacin Itching and Rash 10/15/2016   Fentanyl Itching 01/09/2017   Hctz [hydrochlorothiazide]  01/12/2020   Latex Itching 02/22/2016   Morphine and codeine Itching 10/23/2016   Isovue m [iopamidol] Itching 09/18/2017   Penicillin g benzathine Rash 08/03/2015   Tape Rash 09/27/2015    Medications:  Current Facility-Administered Medications:    ceFAZolin (ANCEF) IVPB 2g/100  mL premix, 2 g, Intravenous, Once, Lovenia Kim, MD   lactated ringers infusion, 10 mL/hr, Intravenous, Continuous, Foye Deer, MD, Last Rate: 10 mL/hr at 10/03/23 0711, 10 mL/hr at 10/03/23 0711   vancomycin (VANCOCIN) IVPB 1000 mg/200 mL premix, 1,000 mg, Intravenous, Once, Lovenia Kim, MD, Last Rate: 200 mL/hr at 10/03/23 6045, 1,000 mg at 10/03/23 0712  Social History: Social History   Tobacco Use   Smoking status: Never   Smokeless tobacco: Never  Vaping Use   Vaping status: Never Used  Substance Use Topics   Alcohol use: No    Alcohol/week: 0.0 standard drinks of alcohol   Drug use: No    Family Medical  History: Family History  Problem Relation Age of Onset   Heart disease Mother    Cancer Father    Alcohol abuse Father     Physical Examination: Vitals:   10/03/23 0617  BP: (!) 172/91  Pulse: 73  Resp: 16  Temp: 97.7 F (36.5 C)  SpO2: 100%    General: Patient is in apparent back and lower extremity pain. Attention to examination is appropriate.  Neck:   Supple.  Full range of motion.  Respiratory: Patient is breathing without any difficulty.  MSK: Evaluate the patient's back noted her previous surgical scar.  This was well-healed without evidence of dehiscence.  NEUROLOGICAL:     Awake, alert, oriented to person, place, and time.  Speech is clear and fluent.   Cranial Nerves: Pupils equal round and reactive to light.  Facial tone is symmetric.  Facial sensation is symmetric. Shoulder shrug is symmetric. Tongue protrusion is midline.    Strength:  Side Iliopsoas Quads Hamstring PF DF EHL  R 5 5 5 5 5 5   L 5 5 5 5 5 5    Reflexes are 1+ and symmetric at the biceps, triceps, brachioradialis, patella and achilles.   Hoffman's is absent. Clonus is absent  Decreased left lower extremity sensation noted.     No evidence of dysmetria noted.  Gait is antalgic  Imaging: Narrative & Impression  CLINICAL DATA:  Low back pain with left leg numbness, burning and tingling. Multiple previous back surgeries.   EXAM: MRI LUMBAR SPINE WITHOUT CONTRAST   TECHNIQUE: Multiplanar, multisequence MR imaging of the lumbar spine was performed. No intravenous contrast was administered.   COMPARISON:  Lumbar MRI 02/20/2020. Lumbar spine CT 08/03/2022 and radiographs 04/11/2023.   FINDINGS: Segmentation: Conventional anatomy assumed, with the last open disc space designated L5-S1.Concordant with the imaging.   Alignment:  Physiologic.   Vertebrae: No worrisome osseous lesion, acute fracture or pars defect. Solid interbody fusion post L3-5 posterior and interbody fusion. There  are bilateral pedicle screws and rods at L5-S1 with posterior fusion at that level on previous CT. There is no endplate edema at W0-J8 to suggest abnormal motion. Mild sacroiliac degenerative changes bilaterally.   Conus medullaris: Extends to the T12-L1 level and appears normal.   Paraspinal and other soft tissues: No significant paraspinal findings. Grossly stable right renal cyst for which no follow-up imaging is recommended.   Disc levels:   T12-L1: Mildly progressive loss of disc height with annular disc bulging. No significant spinal stenosis or nerve root encroachment.   L1-2: Mildly progressive disc bulging with mild facet and ligamentous hypertrophy. No significant spinal stenosis or nerve root encroachment.   L2-3: Stable disc bulging with moderate facet and ligamentous hypertrophy. Disc height is relatively preserved. There is stable mild spinal stenosis and mild lateral  recess narrowing bilaterally. The foramina remain sufficiently patent.   L3-4: The spinal canal and neural foramina remain patent post posterior and interbody fusion.   L4-5: The spinal canal and neural foramina remain patent post posterior and interbody fusion.   L5-S1: Stable postsurgical changes from previous PLIF. As before, definite interbody fusion not demonstrated. There is solid posterior fusion as correlated with previous CT. Stable left foraminal narrowing due to spur. The spinal canal and right foramen are patent.   IMPRESSION: 1. Compared with previous MRI from 02/20/2020, no acute findings or clear explanation for the patient's symptoms. 2. Stable postsurgical changes status post L3 through S1 fusion. Stable moderate chronic left foraminal narrowing at L5-S1 due to spur. 3. Stable mild multifactorial spinal stenosis and lateral recess narrowing bilaterally at L2-3. 4. Mildly progressive disc bulging at T12-L1 and L1-2 without resulting spinal stenosis or nerve root encroachment.      Electronically Signed   By: Carey Bullocks M.D.   On: 04/16/2023 13:05     Narrative & Impression  CLINICAL DATA:  Spinal fusion, thoracic, follow-up. Surgical planning for spinal stimulator. Ongoing pain with tingling and stabbing sensations in the lower body.   EXAM: MRI THORACIC SPINE WITHOUT CONTRAST   TECHNIQUE: Multiplanar, multisequence MR imaging of the thoracic spine was performed. No intravenous contrast was administered.   COMPARISON:  Lumbar MRI 04/11/2023.  Cervical CT 06/18/2020.   FINDINGS: Alignment:  Minimal scoliotic curvature.  No listhesis.   Vertebrae: Old minimal superior endplate deformities at T1 and T2 without residual edema. No other thoracic region fracture.   Cord:  No cord compression or focal cord lesion.   Paraspinal and other soft tissues: Negative   Disc levels:   No significant thoracic disc pathology. Minimal non-compressive disc bulges at T2-3, T7-8, T10-11 and T12-L1. No large bulge or herniation. There is mild facet and ligamentous hypertrophy noted on the right at T5-6 and from T7-8 through T11 and at T12-L1. On the left, there is mild facet and ligamentous hypertrophy from T8-9 through T10-11. None of these appears result in pronounced encroachment upon the canal.   IMPRESSION: 1. No acute or reversible finding. Old minimal superior endplate deformities at T1 and T2. 2. No significant disc pathology. No compressive stenosis. 3. Mild facet and ligamentous hypertrophy on the right at T5-6 and from T7-8 through T11 and at T12-L1. On the left, there is mild facet and ligamentous hypertrophy from T8-9 through T10-11. None of these appears to result in pronounced encroachment upon the canal.     Electronically Signed   By: Paulina Fusi M.D.   On: 09/04/2023 17:46    I have personally reviewed the images and agree with the above interpretation.  Medical Decision Making/Assessment and Plan: Ms. Dickens is a pleasant 72  y.o. female with chronic low back pain with sciatica, prior lumbar fusion lumbosacral radiculopathy, facet arthropathy and chronic pain.  She has had management of her chronic low back pain and radiating leg pain for multiple years.  She has been working with Dr. Laban Emperor.  She has had a previous psychology evaluation.  She has had prior physical therapy.  She recently underwent a spinal cord stimulator trial which she states gave her 80 to 90% improvement.  She is very sad to have it removed as she was feeling the best that she had in some time.  Given her improvement on the spinal cord stimulator trial we obtained an MRI of the thoracic spine which showed adequate space for  a paddle lead.  Will plan on a thoracic laminectomy for spinal cord stimulator placement with pulse generator placement.  Risk and benefits were discussed.  Patient elected to go forward with surgery.  Thank you for involving me in the care of this patient.    Lovenia Kim MD/MSCR Neurosurgery

## 2023-10-03 NOTE — Op Note (Signed)
Indications: the patient is a 72 yo female who was diagnosed with failed back syndrome, chronic low back pain and chronic leg pain. The patient had a successful trial for spinal cord stimulation, so was consented for placement of a permanent device   Findings: successful placement of a thoracic spinal cord stimulator.   Preoperative Diagnosis: Chronic low back pain, failed back surgery syndrome, history of lumbar fusion Postoperative Diagnosis: same     EBL: 50ml IVF: see anesthesia record Drains: none Disposition: Extubated and Stable to PACU Complications: none   No foley catheter was placed.     Preoperative Note: Risks of surgery discussed in clinic.  Verified that the patient did not have any spontaneous improvement in her pain without treatment.  Continues to have significant symptoms.  We discussed the risk and benefits.  She like to go ahead with the surgery.   Operative Note:      The patient was then brought from the preoperative center with intravenous access established.  The patient underwent general anesthesia and endotracheal tube intubation, then was rotated on the [facility] table where all pressure points were appropriately padded.  An incision was marked with flouroscopy at T9/10, and on the right flank. The skin was then thoroughly cleansed.  Perioperative antibiotic prophylaxis was administered.  Sterile prep and drapes were then applied and a timeout was then observed.     Once this was complete an incision was opened with the use of a #10 blade knife in the midline at the thoracic incision.  The paraspinus muscled were subperiosteally dissected until the laminae of T9 and T11 were visualized. Flouroscopy was used to confirm the level. A self-retaining retractor was placed.   The rongeur was used to remove the spinous process of T10.  The drill was used to thin the bone until the ligamentum flavum was visualized.  The ligamentum was then removed and the dura visualized.  This was widened until placement of the paddle lead was possible.  Notably she did have some significant calcifications of her ligamentum flavum as well as her medial facets.  This was asymmetric off to the right side.   The lead was then advanced to the T8 body at the top of the lead.  The lead was secured with a 2-0 silk suture and anchoring devices..     The incision on the flank was then opened and a pocket formed until it was large enough for the pulse generator.  The tunneler was used to connect between the pocket and the incision.  The lead was inserted into the tunneler and tunneled to the buttock.  The leads were attached to the IPG and impedances checked.  The leads were then tightened.  The IPG was then inserted into the pouch, with an antibiotic cover.   Both sites were irrigated.  The wounds were closed in layers with 0 and 2-0 vicryl.  The skin was approximated with a 4 oh STRATAFIX. A sterile dressing was applied.   Monitoring was stable throughout.   Patient was then rotated back to the preoperative bed awakened from anesthesia and taken to recovery. All counts are correct in this case.   I performed the entire procedure with the assistance of Manning Charity PA as an Designer, television/film set. An assistant was required for this procedure due to the complexity.  The assistant provided assistance in tissue manipulation and suction, and was required for the successful and safe performance of the procedure. I performed the critical portions of the  procedure.  Implant Name Type Inv. Item Serial No. Manufacturer Lot No. LRB No. Used Action  STIMULATOR CORD SURESCAN MRI - [id number] Stimulator STIMULATOR CORD SURESCAN MRI  [employer] NEUROMOD PAIN MGMT [id number] N/A 1 Implanted  NEUROSTIMULATOR INCEPTIV - [id number] H Neuro Prosthesis/Implant NEUROSTIMULATOR INCEPTIV [id number] H [employer] NEUROMOD PAIN MGMT  N/A 1 Implanted  ENVELOPE ABSORB ANTIBACTERIAL - [id number] Mesh General ENVELOPE ABSORB  ANTIBACTERIAL  [employer] NEUROMOD PAIN MGMT [id number] N/A 1 Implanted    Lovenia Kim, MD

## 2023-10-03 NOTE — Anesthesia Postprocedure Evaluation (Signed)
Anesthesia Post Note  [name]  Procedure(s) Performed: THORACIC LAMINECTOMY FOR SPINAL CORD STIMULATOR PLACEMENT ([employer])  Patient location during evaluation: PACU Anesthesia Type: General Level of consciousness: awake and alert Pain management: pain level controlled Vital Signs Assessment: post-procedure vital signs reviewed and stable Respiratory status: spontaneous breathing, nonlabored ventilation, respiratory function stable and patient connected to nasal cannula oxygen Cardiovascular status: blood pressure returned to baseline and stable Postop Assessment: no apparent nausea or vomiting Anesthetic complications: no  No notable events documented.   Last Vitals:  Vitals:   10/03/23 1215 10/03/23 1230  BP: 139/88 (!) 143/83  Pulse: 64 70  Resp:  18  Temp:  (!) 36.3 C  SpO2: 94% 100%    Last Pain:  Vitals:   10/03/23 1230  PainSc: 0-No pain                 Stephanie Coup

## 2023-10-03 NOTE — Discharge Instructions (Addendum)
NEUROSURGERY DISCHARGE INSTRUCTIONS  Admission diagnosis: M54.42, G89.29  Chronic left-sided low back pain with left-sided sciatica G89.4 Chronic pain syndrome  Operative procedure: Spinal cord stimulator placement  What to do after you leave the hospital:  Recommended diet: regular diet. Increase protein intake to promote wound healing.  Recommended activity: no lifting, driving, or strenuous exercise for 4 weeks . You should walk multiple times per day  Special Instructions  No straining, no heavy lifting > 10lbs x 4 weeks.  Keep incision area clean and dry. May shower in 2 days. No baths or pools for 6 weeks.  Please remove dressing tomorrow, no need to apply a bandage afterwards  You have no sutures to remove, the skin is closed with adhesive  Please take pain medications as directed. Take a stool softener if on pain medications   Please Report any of the following: Nausea or Vomiting, Temperature is greater than 101.29F (38.1C) degrees, Dizziness, Abdominal Pain, Difficulty Breathing or Shortness of Breath, Inability to Eat, drink Fluids, or Take medications, Bleeding, swelling, or drainage from surgical incision sites, New numbness or weakness, and Bowel or bladder dysfunction to the neurosurgeon on call. How to contact us:  If you have any questions/concerns before or after surgery, you can reach [place] at [phone], or you can send a mychart message. We can be reached by phone or mychart 8am-4pm, Monday-Friday.  *Please note: Calls after 4pm are forwarded to a third party answering service. Mychart messages are not routinely monitored during evenings, weekends, and holidays. Please call our office to contact the answering service for urgent concerns during non-business hours.   Additional Follow up appointments Please follow up with Manning Charity PA-C as scheduled in 2-3 weeks. Please bring all stimulator accessories to this visit   Please see below for scheduled  appointments:  Future Appointments  Date Time Provider Department Center  10/17/2023 10:00 AM Susanne Borders, PA [facility] None  11/13/2023 11:30 AM Lovenia Kim, MD [facility] None  02/25/2024  9:30 AM [facility] ANNUAL WELLNESS VISIT [facility]      AMBULATORY SURGERY  DISCHARGE INSTRUCTIONS   The drugs that you were given will stay in your system until tomorrow so for the next 24 hours you should not:  Drive an automobile Make any legal decisions Drink any alcoholic beverage   You may resume regular meals tomorrow.  Today it is better to start with liquids and gradually work up to solid foods.  You may eat anything you prefer, but it is better to start with liquids, then soup and crackers, and gradually work up to solid foods.   Please notify your doctor immediately if you have any unusual bleeding, trouble breathing, redness and pain at the surgery site, drainage, fever, or pain not relieved by medication.  Information for Discharge Teaching:  DO NOT REMOVE TEAL BRACELET FOR 4 Days (96 hours)  10/07/2023 EXPAREL (bupivacaine liposome injectable suspension)   Pain relief is important to your recovery. The goal is to control your pain so you can move easier and return to your normal activities as soon as possible after your procedure. Your physician may use several types of medicines to manage pain, swelling, and more.  Your surgeon or anesthesiologist gave you EXPAREL(bupivacaine) to help control your pain after surgery.  EXPAREL is a local anesthetic designed to release slowly over an extended period of time to provide pain relief by numbing the tissue around the surgical site. EXPAREL is designed to release pain medication over time  and can control pain for up to 72 hours. Depending on how you respond to EXPAREL, you may require less pain medication during your recovery. EXPAREL can help reduce or eliminate the need for opioids during the first few days after surgery  when pain relief is needed the most. EXPAREL is not an opioid and is not addictive. It does not cause sleepiness or sedation.   Important! A teal colored band has been placed on your arm with the date, time and amount of EXPAREL you have received. Please leave this armband in place for the full 96 hours following administration, and then you may remove the band. If you return to the hospital for any reason within 96 hours following the administration of EXPAREL, the armband provides important information that your health care providers to know, and alerts them that you have received this anesthetic.    Possible side effects of EXPAREL: Temporary loss of sensation or ability to move in the area where medication was injected. Nausea, vomiting, constipation Rarely, numbness and tingling in your mouth or lips, lightheadedness, or anxiety may occur. Call your doctor right away if you think you may be experiencing any of these sensations, or if you have other questions regarding possible side effects.  Follow all other discharge instructions given to you by your surgeon or nurse. Eat a healthy diet and drink plenty of water or other fluids.        Please contact your physician with any problems or Same Day Surgery at [phone], Monday through Friday 6 am to 4 pm, or [place] at [facility] number at [phone].

## 2023-10-04 ENCOUNTER — Other Ambulatory Visit: Payer: Self-pay | Admitting: Neurosurgery

## 2023-10-04 ENCOUNTER — Encounter: Payer: Self-pay | Admitting: Neurosurgery

## 2023-10-04 MED ORDER — OXYCODONE HCL 5 MG PO TABS
5.0000 mg | ORAL_TABLET | ORAL | 0 refills | Status: AC | PRN
Start: 2023-10-04 — End: 2023-10-09

## 2023-10-04 NOTE — Progress Notes (Signed)
Patient called in with severe pain not well-controlled on 5 mg oxycodone.  Given her chronic pain history we recommended that she increased to 5 to 10 mg.  We updated her order.  We also recommended that she take her muscle relaxant as well as her scheduled Tylenol to help with her pain medications.  She can also use ice on her incision to help with that.

## 2023-10-06 NOTE — Plan of Care (Signed)
[facility] Tonsillectomy/Adenoidectomy, Postoperative PEDS care plan entered in error.

## 2023-10-10 ENCOUNTER — Emergency Department
Admission: EM | Admit: 2023-10-10 | Discharge: 2023-10-11 | Disposition: A | Discharge status: Home / Self Care | Payer: Medicare HMO | Attending: Emergency Medicine | Admitting: Emergency Medicine

## 2023-10-10 ENCOUNTER — Other Ambulatory Visit: Payer: Self-pay

## 2023-10-10 ENCOUNTER — Emergency Department: Payer: Medicare HMO

## 2023-10-10 ENCOUNTER — Encounter: Payer: Self-pay | Admitting: Neurosurgery

## 2023-10-10 DIAGNOSIS — K5730 Diverticulosis of large intestine without perforation or abscess without bleeding: Secondary | ICD-10-CM | POA: Diagnosis not present

## 2023-10-10 DIAGNOSIS — K449 Diaphragmatic hernia without obstruction or gangrene: Secondary | ICD-10-CM | POA: Diagnosis not present

## 2023-10-10 DIAGNOSIS — G8918 Other acute postprocedural pain: Secondary | ICD-10-CM | POA: Insufficient documentation

## 2023-10-10 DIAGNOSIS — R109 Unspecified abdominal pain: Secondary | ICD-10-CM | POA: Insufficient documentation

## 2023-10-10 DIAGNOSIS — J9811 Atelectasis: Secondary | ICD-10-CM | POA: Diagnosis not present

## 2023-10-10 DIAGNOSIS — R112 Nausea with vomiting, unspecified: Secondary | ICD-10-CM | POA: Diagnosis not present

## 2023-10-10 DIAGNOSIS — R918 Other nonspecific abnormal finding of lung field: Secondary | ICD-10-CM | POA: Diagnosis not present

## 2023-10-10 DIAGNOSIS — M545 Low back pain, unspecified: Secondary | ICD-10-CM | POA: Diagnosis not present

## 2023-10-10 DIAGNOSIS — Z4789 Encounter for other orthopedic aftercare: Secondary | ICD-10-CM | POA: Diagnosis not present

## 2023-10-10 DIAGNOSIS — R509 Fever, unspecified: Secondary | ICD-10-CM | POA: Insufficient documentation

## 2023-10-10 DIAGNOSIS — M549 Dorsalgia, unspecified: Secondary | ICD-10-CM | POA: Diagnosis not present

## 2023-10-10 DIAGNOSIS — M51369 Other intervertebral disc degeneration, lumbar region without mention of lumbar back pain or lower extremity pain: Secondary | ICD-10-CM | POA: Diagnosis not present

## 2023-10-10 DIAGNOSIS — I10 Essential (primary) hypertension: Secondary | ICD-10-CM | POA: Diagnosis not present

## 2023-10-10 DIAGNOSIS — K8689 Other specified diseases of pancreas: Secondary | ICD-10-CM | POA: Diagnosis not present

## 2023-10-10 DIAGNOSIS — M48061 Spinal stenosis, lumbar region without neurogenic claudication: Secondary | ICD-10-CM | POA: Diagnosis not present

## 2023-10-10 LAB — CBC WITH DIFFERENTIAL/PLATELET
Abs Immature Granulocytes: 0.02 10*3/uL (ref 0.00–0.07)
Basophils Absolute: 0 10*3/uL (ref 0.0–0.1)
Basophils Relative: 1 %
Eosinophils Absolute: 0.2 10*3/uL (ref 0.0–0.5)
Eosinophils Relative: 3 %
HCT: 41.8 % (ref 36.0–46.0)
Hemoglobin: 13.5 g/dL (ref 12.0–15.0)
Immature Granulocytes: 1 %
Lymphocytes Relative: 25 %
Lymphs Abs: 1.1 10*3/uL (ref 0.7–4.0)
MCH: 27.8 pg (ref 26.0–34.0)
MCHC: 32.3 g/dL (ref 30.0–36.0)
MCV: 86 fL (ref 80.0–100.0)
Monocytes Absolute: 0.4 10*3/uL (ref 0.1–1.0)
Monocytes Relative: 10 %
Neutro Abs: 2.7 10*3/uL (ref 1.7–7.7)
Neutrophils Relative %: 60 %
Platelets: 343 10*3/uL (ref 150–400)
RBC: 4.86 MIL/uL (ref 3.87–5.11)
RDW: 13.2 % (ref 11.5–15.5)
WBC: 4.4 10*3/uL (ref 4.0–10.5)
nRBC: 0 % (ref 0.0–0.2)

## 2023-10-10 LAB — COMPREHENSIVE METABOLIC PANEL
ALT: 13 U/L (ref 0–44)
AST: 20 U/L (ref 15–41)
Albumin: 3.8 g/dL (ref 3.5–5.0)
Alkaline Phosphatase: 101 U/L (ref 38–126)
Anion gap: 11 (ref 5–15)
BUN: 11 mg/dL (ref 8–23)
CO2: 27 mmol/L (ref 22–32)
Calcium: 9.7 mg/dL (ref 8.9–10.3)
Chloride: 98 mmol/L (ref 98–111)
Creatinine, Ser: 0.8 mg/dL (ref 0.44–1.00)
GFR, Estimated: >60 mL/min (ref >60)
Glucose, Bld: 118 mg/dL — ABNORMAL HIGH (ref 70–99)
Potassium: 3.9 mmol/L (ref 3.5–5.1)
Sodium: 136 mmol/L (ref 135–145)
Total Bilirubin: 0.9 mg/dL (ref 0.3–1.2)
Total Protein: 8.6 g/dL — ABNORMAL HIGH (ref 6.5–8.1)

## 2023-10-10 LAB — PROTIME-INR
INR: 1.2 (ref 0.8–1.2)
Prothrombin Time: 15.2 s (ref 11.4–15.2)

## 2023-10-10 LAB — APTT: aPTT: 29 s (ref 24–36)

## 2023-10-10 LAB — LACTIC ACID, PLASMA: Lactic Acid, Venous: 1.3 mmol/L (ref 0.5–1.9)

## 2023-10-10 MED ORDER — ONDANSETRON HCL 4 MG/2ML IJ SOLN
4.0000 mg | Freq: Once | INTRAMUSCULAR | Status: AC
Start: 2023-10-10 — End: 2023-10-10
  Administered 2023-10-10: 4 mg via INTRAVENOUS
  Filled 2023-10-10: qty 2

## 2023-10-10 MED ORDER — KETOROLAC TROMETHAMINE 15 MG/ML IJ SOLN
15.0000 mg | Freq: Once | INTRAMUSCULAR | Status: AC
  Administered 2023-10-10: 15 mg via INTRAVENOUS
  Filled 2023-10-10: qty 1

## 2023-10-10 NOTE — ED Triage Notes (Signed)
BIB husband by POV from home for back pain. Back pain stimulator placed 1 week ago Thursday. Sx developed Tuesday, including: chills, fever, NV, back pain, numbness and tingling. Denies urinary sx, incontinence, fall, increased weakness. No antipyretics PTA. Alert, NAD, calm, interactive, resps e/u. Back incisions x2 appear well, no redness, drainage, dehisence. Minimal swelling and heat present. Pt shivering. EDPA present during triage.

## 2023-10-10 NOTE — ED Provider Triage Note (Signed)
Emergency Medicine Provider Triage Evaluation Note  Maria Hamilton , a 72 y.o. female  was evaluated in triage.  Pt complains of chronic back pain. Had a nerve stimulator placed last Thursday. Symptoms of fever and chills began on Tuesday.   Review of Systems  Positive: Back pain, fevers, chills, nausea Negative: Drainage from incision site  Physical Exam  There were no vitals taken for this visit. Gen:   Awake, moderate distress, Resp:  Normal effort  MSK:   Moves extremities without difficulty  Other:  No drainage from the incision sites, no tenderness to palpation, incision site does feel a bit warm when compared to other areas of patient's back.  Medical Decision Making  Medically screening exam initiated at 5:02 PM.  Appropriate orders placed.  ETSUKO HUMMEL was informed that the remainder of the evaluation will be completed by another provider, this initial triage assessment does not replace that evaluation, and the importance of remaining in the ED until their evaluation is complete.     Cameron Ali, PA-C 10/10/23 1721

## 2023-10-10 NOTE — ED Notes (Signed)
Back from xray, alert, NAD, calm, interactive, sitting in w/c, no changes. Labs sent. Meds given.

## 2023-10-10 NOTE — ED Notes (Signed)
Pt to ct scan.

## 2023-10-10 NOTE — ED Notes (Signed)
Pt reports back pain.  Pt states pain stimulator placed and pt continues to have back pain.  Pt denies urinary sx.  Pt alert   iv in place   family with pt.

## 2023-10-10 NOTE — ED Provider Notes (Signed)
[facility] Provider Note    Event Date/Time   First MD Initiated Contact with Patient 10/10/23 2050     (approximate)   History   Back Pain   HPI  Maria Hamilton is a 72 year old female with history of chronic pain presenting to the emergency department for evaluation of nausea and back pain. Patient had thoracic spinal cord stimulator placed on 10/03/2023 with Dr. Katrinka Blazing.  Reports that a few days ago she developed some subjective fevers, nausea with 1 episode of nonbloody vomiting and development of right sided back pain.  Triage note notes numbness and tingling, the patient denies any new numbness or tingling to me.  Reports that her chronic back pain is usually on her left side is new for her to have right lower back pain.  No bowel or bladder incontinence or difficulty emptying bladder.  Has not taken any antipyretics.  Has pain medication available at home that she has been using intermittently.    Physical Exam   Triage Vital Signs: ED Triage Vitals  Encounter Vitals Group     BP 10/10/23 1710 (!) 131/92     Systolic BP Percentile --      Diastolic BP Percentile --      Pulse Rate 10/10/23 1710 71     Resp 10/10/23 1710 20     Temp 10/10/23 1710 98.4 F (36.9 C)     Temp Source 10/10/23 1710 Oral     SpO2 10/10/23 1710 100 %     Weight 10/10/23 1711 203 lb 14.8 oz (92.5 kg)     Height --      Head Circumference --      Peak Flow --      Pain Score 10/10/23 1710 7     Pain Loc --      Pain Education --      Exclude from Growth Chart --     Most recent vital signs: Vitals:   10/10/23 2120 10/10/23 2340  BP:  (!) 160/97  Pulse:  68  Resp:  17  Temp: 98.4 F (36.9 C)   SpO2:  100%     General: Awake, interactive  CV:  Regular rate, good peripheral perfusion.  Resp:  Lungs clear to auscultation, respirations unlabored Abd:  Soft, nontender, nondistended Neuro:  Symmetric facial movement, fluid speech, 5-5 strength in the  bilateral upper and lower extremities with normal sensation Back:  No midline tenderness.  Midline incision along the thoracic region, closed, no surrounding erythema or drainage.  There is a palpable area over her right lower back consistent with a battery for her spinal cord stimulator.  There is some tenderness in this area without overlying erythema, warmth.   ED Results / Procedures / Treatments   Labs (all labs ordered are listed, but only abnormal results are displayed) Labs Reviewed  COMPREHENSIVE METABOLIC PANEL - Abnormal; Notable for the following components:      Result Value   Glucose, Bld 118 (*)    Total Protein 8.6 (*)    All other components within normal limits  CULTURE, BLOOD (ROUTINE X 2)  CULTURE, BLOOD (ROUTINE X 2)  LACTIC ACID, PLASMA  CBC WITH DIFFERENTIAL/PLATELET  PROTIME-INR  APTT  URINALYSIS, W/ REFLEX TO CULTURE (INFECTION SUSPECTED)     EKG EKG independently reviewed interpreted by myself (ER attending) demonstrates:  EKG demonstrates normal sinus rhythm at rate of 72, PR 142, QRS 78, QTc 470, no acute ST changes  RADIOLOGY Imaging  independently reviewed and interpreted by myself demonstrates:  Chest x-Venisa Frampton without focal consolidation CT abdomen pelvis without acute intra-abdominal finding, battery pack noted without adjacent fluid collection, no other findings concerning for postoperative complication noted by radiology  PROCEDURES:  Critical Care performed: No  Procedures   MEDICATIONS ORDERED IN ED: Medications  ketorolac (TORADOL) 15 MG/ML injection 15 mg (15 mg Intravenous Given 10/10/23 1729)  ondansetron (ZOFRAN) injection 4 mg (4 mg Intravenous Given 10/10/23 1729)     IMPRESSION / MDM / ASSESSMENT AND PLAN / ED COURSE  I reviewed the triage vital signs and the nursing notes.  Differential diagnosis includes, but is not limited to, postoperative pain, postsurgical complication including abscess, lead discontinuity, pneumonia,  UTI, other infection, anemia, electrolyte abnormality  Patient's presentation is most consistent with acute presentation with potential threat to life or bodily function.  72 year old female presenting with fatigue and 1 episode of vomiting as well as back pain at the site of recent surgery.  Subjective fevers at home, but afebrile here on multiple checks with stable vitals.  No evidence of sepsis.  Labs and cultures sent from triage with normal white blood cell count.  No significant derangements noted.  Normal lactate.  At the time of my initial evaluation several hours after patient's presentation, she reports she actually feels much improved.  Given recent surgery, CT was obtained of the abdomen pelvis and lumbar region which did not demonstrate any appreciable postoperative complication.  Patient eager to be discharged on reevaluation.  Her urinalysis was pending.  Provided a urine sample, but tells me she is not willing to stay any longer to wait for results or any further testing.  I have flagged her urine for follow-up.  I will contact her if this returns positive, preferred pharmacy [facility] on shadow break.  Strict return precautions provided.  Patient discharged in stable condition.      FINAL CLINICAL IMPRESSION(S) / ED DIAGNOSES   Final diagnoses:  Acute right-sided low back pain without sciatica  Nausea and vomiting, unspecified vomiting type  Post-operative pain     Rx / DC Orders   ED Discharge Orders     None        Note:  This document was prepared using Dragon voice recognition software and may include unintentional dictation errors.   Trinna Post, MD 10/10/23 [id number]

## 2023-10-10 NOTE — Discharge Instructions (Signed)
You were seen in the emergency department today for evaluation of your nausea and postoperative pain.  Your testing was overall reassuring.  Please contact your surgeon to arrange follow-up.  I sent a prescription for nausea medicine to your pharmacy. Return to the ER for new or worsening symptoms.

## 2023-10-10 NOTE — ED Notes (Signed)
Pt up to nurses station asking to leave and to have IV taken out. Ray MD informed

## 2023-10-11 LAB — URINALYSIS, W/ REFLEX TO CULTURE (INFECTION SUSPECTED)
Bacteria, UA: NONE SEEN
Bilirubin Urine: NEGATIVE
Glucose, UA: NEGATIVE mg/dL
Hgb urine dipstick: NEGATIVE
Ketones, ur: NEGATIVE mg/dL
Leukocytes,Ua: NEGATIVE
Nitrite: NEGATIVE
Protein, ur: NEGATIVE mg/dL
Specific Gravity, Urine: 1.018 (ref 1.005–1.030)
pH: 7 (ref 5.0–8.0)

## 2023-10-15 LAB — CULTURE, BLOOD (ROUTINE X 2): Culture: NO GROWTH

## 2023-10-17 ENCOUNTER — Encounter: Payer: Self-pay | Admitting: Neurosurgery

## 2023-10-17 ENCOUNTER — Ambulatory Visit: Payer: Medicare HMO | Admitting: Neurosurgery

## 2023-10-17 VITALS — BP 126/76 | Temp 97.9°F | Ht 73.0 in | Wt 203.0 lb

## 2023-10-17 DIAGNOSIS — M5442 Lumbago with sciatica, left side: Secondary | ICD-10-CM | POA: Diagnosis not present

## 2023-10-17 DIAGNOSIS — G8929 Other chronic pain: Secondary | ICD-10-CM | POA: Diagnosis not present

## 2023-10-17 DIAGNOSIS — Z09 Encounter for follow-up examination after completed treatment for conditions other than malignant neoplasm: Secondary | ICD-10-CM

## 2023-10-17 DIAGNOSIS — M961 Postlaminectomy syndrome, not elsewhere classified: Secondary | ICD-10-CM

## 2023-10-17 NOTE — Progress Notes (Signed)
   REFERRING PHYSICIAN:  Glori Luis, Md [address] [place],  [place] [address]  DOS: 10/03/23 thoracic SCS ([employer]) with Dr. Katrinka Blazing  HISTORY OF PRESENT ILLNESS: Maria Hamilton is approximately 2 weeks status post SCS placement. she is doing fair after surgery.  She denies any incisional complaints however she has had persistent back and leg pain to her feet which is unchanged from preop.  She is under the impression that her stimulator is not yet turned on. She went to the ER on 10/24 for nausea and increased back pain as well as subjective fevers.  Lab work was relatively normal and the patient was discharged home.  She states that since then she has been better without any persistent fevers despite continued back pain and incisional soreness.  She denies any drainage from her incisions.  PHYSICAL EXAMINATION:  General: Patient is well developed, well nourished, calm, collected, and in no apparent distress.   NEUROLOGICAL:  General: In no acute distress.   Awake, alert, oriented to person, place, and time.  Pupils equal round and reactive to light.  Facial tone is symmetric.  Tongue protrusion is midline.  There is no pronator drift.   Strength:            Side Iliopsoas Quads Hamstring PF DF EHL  R 5 5 5 5 5 5   L 5 5 5 5 5 5    Incision c/d/I.  There is a large nonboggy fluid collection about 4 to 5 cm in circumference at the right flank incision.  There is no evidence of dehiscence or drainage.      ROS (Neurologic):  Negative except as noted above  IMAGING: No interval imaging to review  ASSESSMENT/PLAN:  Maria Hamilton is doing fair approximately 2 weeks after SCS placement.  Given her overall clinical improvement and reassuring lab work on 10/24 in the ER, and the appearance of her incisions, I have a low suspicion for infection.  I suspect her battery site swelling is likely a postoperative hematoma.  I recommend that she continue to ice  multiple times a day and monitor.  Should she develop any fevers, chills, or drainage from her incision she is to call us immediately.  Should she have any clinical worsening we will reevaluate and likely obtain labs to evaluate for infection.  We discussed activity escalation and I have advised the patient to lift up to 10 pounds until 6 weeks after surgery, then increase up to 25 pounds until 12 weeks after surgery.  After 12 weeks post-op, the patient advised to increase activity as tolerated. she will follow up in 1 week for an incision check.  Advised to contact the office if any questions or concerns arise.  Manning Charity PA-C Department of neurosurgery

## 2023-10-19 ENCOUNTER — Other Ambulatory Visit: Payer: Self-pay | Admitting: Family Medicine

## 2023-10-19 DIAGNOSIS — M792 Neuralgia and neuritis, unspecified: Secondary | ICD-10-CM

## 2023-10-19 DIAGNOSIS — G629 Polyneuropathy, unspecified: Secondary | ICD-10-CM

## 2023-10-24 ENCOUNTER — Ambulatory Visit: Payer: Medicare HMO | Admitting: Orthopedic Surgery

## 2023-10-24 ENCOUNTER — Encounter: Payer: Self-pay | Admitting: Orthopedic Surgery

## 2023-10-24 VITALS — BP 118/76 | Temp 97.9°F | Ht 73.0 in | Wt 203.0 lb

## 2023-10-24 DIAGNOSIS — G894 Chronic pain syndrome: Secondary | ICD-10-CM | POA: Diagnosis not present

## 2023-10-24 DIAGNOSIS — Z9689 Presence of other specified functional implants: Secondary | ICD-10-CM

## 2023-10-24 NOTE — Progress Notes (Signed)
   REFERRING PHYSICIAN:  No referring provider defined for this encounter.  DOS: 10/03/23 thoracic SCS ([employer]) with Dr. Katrinka Blazing  HISTORY OF PRESENT ILLNESS:   Last seen by Duwayne Heck on 10/17/23. She was found to have a probable postop hematoma at her battery site. She was to continue to ice and is here for follow up.   She has not been able to get SCS to turn on. She still has her preop LBP and leg pain.   No fevers or chills. She thinks the swelling has improved.   PHYSICAL EXAMINATION:  General: Patient is well developed, well nourished, calm, collected, and in no apparent distress.   NEUROLOGICAL:  General: In no acute distress.   Awake, alert, oriented to person, place, and time.  Pupils equal round and reactive to light.  Facial tone is symmetric.    Strength:         Side Iliopsoas Quads Hamstring PF DF EHL  R 5 5 5 5 5 5   L 5 5 5 5 5 5    Incisions c/d/I and healing well. No signs of infection.   She has very minimal fluid collection at right flank incision. Appears to have decreased since last week. No drainage or fluctuance.       ROS (Neurologic):  Negative except as noted above  IMAGING: No interval imaging to review  ASSESSMENT/PLAN:  Maria Hamilton is doing okay s/p SCS placement. Treatment options reviewed with her and following plan made:   - Called [employer] rep (Adam) to let him know she is not able to turn SCS on. This may be from swelling at battery site, but he will call her and follow up.  - No lifting over 10 pounds prior to her follow up with Dr. Katrinka Blazing.  - No bending, twisting, or lifting.  - Reviewed wound care. Call if any concerns.  - Follow up as scheduled with Dr. Katrinka Blazing.   Advised to contact the office if any questions or concerns arise.  Drake Leach PA-C Department of neurosurgery

## 2023-10-31 NOTE — Telephone Encounter (Signed)
Error

## 2023-11-06 ENCOUNTER — Other Ambulatory Visit: Payer: Self-pay | Admitting: Family Medicine

## 2023-11-07 ENCOUNTER — Other Ambulatory Visit: Payer: Self-pay | Admitting: Family Medicine

## 2023-11-11 NOTE — Telephone Encounter (Signed)
Please call the patient.  It looks like somebody recently prescribed Robaxin for her.  This is also a muscle relaxer.  She should not be taking tizanidine and Robaxin.  Please see which she is taking.

## 2023-11-11 NOTE — Telephone Encounter (Signed)
Prescription Request  11/11/2023  LOV: 03/20/2023  What is the name of the medication or equipment? tiZANidine   Have you contacted your pharmacy to request a refill? No   Which pharmacy would you like this sent to? [facility]   Patient notified that their request is being sent to the clinical staff for review and that they should receive a response within 2 business days.   Please advise at Mobile [phone] (mobile)

## 2023-11-13 ENCOUNTER — Encounter: Payer: Self-pay | Admitting: Neurosurgery

## 2023-11-13 ENCOUNTER — Ambulatory Visit: Payer: Medicare HMO | Admitting: Neurosurgery

## 2023-11-13 VITALS — BP 130/78 | Ht 73.0 in | Wt 203.0 lb

## 2023-11-13 DIAGNOSIS — Z981 Arthrodesis status: Secondary | ICD-10-CM

## 2023-11-13 DIAGNOSIS — G894 Chronic pain syndrome: Secondary | ICD-10-CM

## 2023-11-13 DIAGNOSIS — Z9689 Presence of other specified functional implants: Secondary | ICD-10-CM

## 2023-11-13 DIAGNOSIS — G8929 Other chronic pain: Secondary | ICD-10-CM

## 2023-11-13 DIAGNOSIS — M5442 Lumbago with sciatica, left side: Secondary | ICD-10-CM

## 2023-11-13 NOTE — Progress Notes (Signed)
   REFERRING PHYSICIAN:  Nicholaus Corolla [address] [place],  [place] [address]  DOS: 10/03/23 thoracic SCS ([employer])   HISTORY OF PRESENT ILLNESS:  Her battery site swelling has improved significantly.  She has been continuing to have difficulty with her recharge and programming.  She has not yet reached out to her stimulator team.  She has not noticed a significant improvement in her issues.  PHYSICAL EXAMINATION:  General: Patient is well developed, well nourished, calm, collected, and in no apparent distress.  NEUROLOGICAL:  General: In no acute distress.   Awake, alert, oriented to person, place, and time.  Pupils equal round and reactive to light.  Facial tone is symmetric.    Strength:         Side Iliopsoas Quads Hamstring PF DF EHL  R 5 5 5 5 5 5   L 5 5 5 5 5 5    Incisions c/d/I and healing well. No signs of infection.   Significantly improved battery site swelling  ROS (Neurologic):  Negative except as noted above  IMAGING: No interval imaging to review  ASSESSMENT/PLAN:  Maria Hamilton is doing okay s/p SCS placement. Treatment options reviewed with her and following plan made:   -She met today with our [employer] team, it was found that she had not yet been able to turn on her stimulator.  They worked with her directly and helped to get her stimulator on and functioning..  -Can progress as tolerated.  - No bending, twisting, or lifting.  - Reviewed wound care. Call if any concerns.  -Follow-up as scheduled  Advised to contact the office if any questions or concerns arise.  Lovenia Kim, MD Department of neurosurgery

## 2023-11-14 ENCOUNTER — Other Ambulatory Visit: Payer: Self-pay | Admitting: Family Medicine

## 2023-11-14 DIAGNOSIS — I10 Essential (primary) hypertension: Secondary | ICD-10-CM

## 2023-11-28 ENCOUNTER — Telehealth: Payer: Self-pay | Admitting: Family Medicine

## 2023-11-28 NOTE — Telephone Encounter (Signed)
Tizanadine sent to pharmacy.

## 2023-12-09 ENCOUNTER — Other Ambulatory Visit: Payer: Self-pay

## 2023-12-25 ENCOUNTER — Other Ambulatory Visit: Payer: Self-pay | Admitting: Family Medicine

## 2023-12-28 ENCOUNTER — Other Ambulatory Visit: Payer: Self-pay | Admitting: Family Medicine

## 2024-01-01 ENCOUNTER — Other Ambulatory Visit: Payer: Self-pay | Admitting: Family Medicine

## 2024-01-01 DIAGNOSIS — M7918 Myalgia, other site: Secondary | ICD-10-CM

## 2024-01-19 ENCOUNTER — Other Ambulatory Visit: Payer: Self-pay | Admitting: Family Medicine

## 2024-01-27 ENCOUNTER — Other Ambulatory Visit: Payer: Self-pay | Admitting: Family Medicine

## 2024-01-31 ENCOUNTER — Other Ambulatory Visit: Payer: Self-pay | Admitting: Family Medicine

## 2024-01-31 DIAGNOSIS — I10 Essential (primary) hypertension: Secondary | ICD-10-CM

## 2024-02-06 ENCOUNTER — Other Ambulatory Visit: Payer: Self-pay | Admitting: Family Medicine

## 2024-02-11 ENCOUNTER — Ambulatory Visit: Payer: Medicare HMO | Admitting: Family Medicine

## 2024-02-12 ENCOUNTER — Encounter: Payer: Self-pay | Admitting: Family Medicine

## 2024-02-12 ENCOUNTER — Ambulatory Visit (INDEPENDENT_AMBULATORY_CARE_PROVIDER_SITE_OTHER): Payer: Medicare HMO | Admitting: Family Medicine

## 2024-02-12 VITALS — BP 124/76 | HR 63 | Temp 97.8°F | Ht 73.0 in | Wt 200.8 lb

## 2024-02-12 DIAGNOSIS — M5442 Lumbago with sciatica, left side: Secondary | ICD-10-CM | POA: Diagnosis not present

## 2024-02-12 DIAGNOSIS — E785 Hyperlipidemia, unspecified: Secondary | ICD-10-CM

## 2024-02-12 DIAGNOSIS — R7309 Other abnormal glucose: Secondary | ICD-10-CM | POA: Diagnosis not present

## 2024-02-12 DIAGNOSIS — I10 Essential (primary) hypertension: Secondary | ICD-10-CM

## 2024-02-12 DIAGNOSIS — G8929 Other chronic pain: Secondary | ICD-10-CM | POA: Diagnosis not present

## 2024-02-12 DIAGNOSIS — F32A Depression, unspecified: Secondary | ICD-10-CM

## 2024-02-12 DIAGNOSIS — F419 Anxiety disorder, unspecified: Secondary | ICD-10-CM

## 2024-02-12 DIAGNOSIS — M7918 Myalgia, other site: Secondary | ICD-10-CM | POA: Diagnosis not present

## 2024-02-12 MED ORDER — GABAPENTIN 600 MG PO TABS: 600.0000 mg | ORAL_TABLET | Freq: Every day | ORAL | 1 refills | Status: DC

## 2024-02-12 MED ORDER — TIZANIDINE HCL 2 MG PO TABS: ORAL_TABLET | ORAL | 0 refills | Status: DC

## 2024-02-12 NOTE — Assessment & Plan Note (Signed)
 Chronic issue.  Adequately controlled today.  Patient will continue amlodipine 10 mg daily, carvedilol 12.5 mg twice daily, and spironolactone 12.5 mg daily.  She will return for lab work.

## 2024-02-12 NOTE — Assessment & Plan Note (Signed)
 Chronic issue.  Adequately controlled.  Continue Cymbalta 60 mg daily.

## 2024-02-12 NOTE — Assessment & Plan Note (Signed)
 Chronic issue.  Pain has improved.  Still having left leg symptoms.  She will monitor.  Refill of tizanidine and gabapentin provided.

## 2024-02-12 NOTE — Progress Notes (Signed)
 Maria Alar, MD Phone: [phone]  Maria Hamilton is a 73 y.o. female who presents today for f/u.  HYPERTENSION Disease Monitoring Home BP Monitoring not checking Chest pain- no    Dyspnea- no Medications Compliance-  taking amlodipine, coreg, spironolactone. BMET    Component Value Date/Time   NA 136 10/10/2023 1732   NA 137 03/02/2014 1131   K 3.9 10/10/2023 1732   K 3.2 (L) 03/02/2014 1131   CL 98 10/10/2023 1732   CL 104 03/02/2014 1131   CO2 27 10/10/2023 1732   CO2 30 03/02/2014 1131   GLUCOSE 118 (H) 10/10/2023 1732   GLUCOSE 88 03/02/2014 1131   BUN 11 10/10/2023 1732   BUN 5 (L) 03/02/2014 1131   CREATININE 0.80 10/10/2023 1732   CREATININE 1.00 (H) 02/05/2020 1350   CALCIUM 9.7 10/10/2023 1732   CALCIUM 8.5 03/02/2014 1131   GFRNONAA >60 10/10/2023 1732   GFRNONAA >60 03/02/2014 1131   GFRAA >60 07/10/2018 1315   GFRAA >60 03/02/2014 1131   Chronic low back pain: Patient reports back pain is adequately controlled.  Does note some left leg symptoms.  She had to have spinal cord stimulator placed which has helped with her back though not her leg symptoms.  Does take tizanidine at night to help with the pain.  Also on gabapentin.  No she has been out of those for a little while.  Anxiety/depression: Patient notes this is adequately controlled.  She is on Cymbalta.  No SI.  Social History   Tobacco Use  Smoking Status Never  Smokeless Tobacco Never    Current Outpatient Medications on File Prior to Visit  Medication Sig Dispense Refill   amLODipine (NORVASC) 10 MG tablet TAKE 1 TABLET EVERY DAY 90 tablet 3   Calcium Carb-Cholecalciferol (CALCIUM 600/VITAMIN D3) 600-20 MG-MCG TABS Take 1 tablet by mouth daily.     carvedilol (COREG) 12.5 MG tablet TAKE 1 TABLET TWICE DAILY WITH MEALS 180 tablet 3   DULoxetine (CYMBALTA) 60 MG capsule TAKE 1 CAPSULE EVERY DAY 90 capsule 3   potassium chloride SA (KLOR-CON M) 20 MEQ tablet TAKE 1 TABLET EVERY DAY 90  tablet 3   spironolactone (ALDACTONE) 25 MG tablet TAKE 1/2 TABLET EVERY MORNING 45 tablet 0   No current facility-administered medications on file prior to visit.     ROS see history of present illness  Objective  Physical Exam Vitals:   02/12/24 0858  BP: 124/76  Pulse: 63  Temp: 97.8 F (36.6 C)  SpO2: 97%    BP Readings from Last 3 Encounters:  02/12/24 124/76  11/13/23 130/78  10/24/23 118/76   Wt Readings from Last 3 Encounters:  02/12/24 200 lb 12.8 oz (91.1 kg)  11/13/23 203 lb (92.1 kg)  10/24/23 203 lb (92.1 kg)    Physical Exam Constitutional:      General: She is not in acute distress.    Appearance: She is not diaphoretic.  Cardiovascular:     Rate and Rhythm: Normal rate and regular rhythm.     Heart sounds: Normal heart sounds.  Pulmonary:     Effort: Pulmonary effort is normal.     Breath sounds: Normal breath sounds.  Skin:    General: Skin is warm and dry.  Neurological:     Mental Status: She is alert.      Assessment/Plan: Please see individual problem list.  Primary hypertension Assessment & Plan: Chronic issue.  Adequately controlled today.  Patient will continue amlodipine 10 mg  daily, carvedilol 12.5 mg twice daily, and spironolactone 12.5 mg daily.  She will return for lab work.   Musculoskeletal pain -     Gabapentin; Take 1 tablet (600 mg total) by mouth at bedtime.  Dispense: 90 tablet; Refill: 1 -     tiZANidine HCl; TAKE 2 TABLETS BY MOUTH AT BEDTIME AS NEEDED FOR MUSCLE SPASMS  Dispense: 60 tablet; Refill: 0  Chronic low back pain (2ry area of Pain) (Left) w/ sciatica (Left) Assessment & Plan: Chronic issue.  Pain has improved.  Still having left leg symptoms.  She will monitor.  Refill of tizanidine and gabapentin provided.  Orders: -     tiZANidine HCl; TAKE 2 TABLETS BY MOUTH AT BEDTIME AS NEEDED FOR MUSCLE SPASMS  Dispense: 60 tablet; Refill: 0  Anxiety and depression Assessment & Plan: Chronic issue.  Adequately  controlled.  Continue Cymbalta 60 mg daily.   Hyperlipidemia, unspecified hyperlipidemia type -     Comprehensive metabolic panel; Future -     Lipid panel; Future  Elevated glucose -     Hemoglobin A1c; Future    Return in about 1 week (around 02/19/2024) for labs, 6 months transfer of care.   Maria Alar, MD [facility]

## 2024-02-19 ENCOUNTER — Other Ambulatory Visit: Payer: Medicare HMO

## 2024-02-19 ENCOUNTER — Other Ambulatory Visit: Payer: Self-pay

## 2024-02-19 ENCOUNTER — Other Ambulatory Visit

## 2024-02-19 DIAGNOSIS — E785 Hyperlipidemia, unspecified: Secondary | ICD-10-CM | POA: Diagnosis not present

## 2024-02-19 DIAGNOSIS — R7309 Other abnormal glucose: Secondary | ICD-10-CM | POA: Diagnosis not present

## 2024-02-19 DIAGNOSIS — I10 Essential (primary) hypertension: Secondary | ICD-10-CM

## 2024-02-19 LAB — LIPID PANEL
Cholesterol: 187 mg/dL (ref 0–200)
HDL: 53.8 mg/dL (ref >39.00)
LDL Cholesterol: 112 mg/dL — ABNORMAL HIGH (ref 0–99)
NonHDL: 132.91
Total CHOL/HDL Ratio: 3
Triglycerides: 107 mg/dL (ref 0.0–149.0)
VLDL: 21.4 mg/dL (ref 0.0–40.0)

## 2024-02-19 LAB — HEMOGLOBIN A1C: Hgb A1c MFr Bld: 5.9 % (ref 4.6–6.5)

## 2024-02-19 LAB — COMPREHENSIVE METABOLIC PANEL
ALT: 9 U/L (ref 0–35)
AST: 15 U/L (ref 0–37)
Albumin: 4 g/dL (ref 3.5–5.2)
Alkaline Phosphatase: 111 U/L (ref 39–117)
BUN: 6 mg/dL (ref 6–23)
CO2: 31 meq/L (ref 19–32)
Calcium: 9.3 mg/dL (ref 8.4–10.5)
Chloride: 101 meq/L (ref 96–112)
Creatinine, Ser: 0.85 mg/dL (ref 0.40–1.20)
GFR: 68.23 mL/min (ref >60.00)
Glucose, Bld: 102 mg/dL — ABNORMAL HIGH (ref 70–99)
Potassium: 3.4 meq/L — ABNORMAL LOW (ref 3.5–5.1)
Sodium: 137 meq/L (ref 135–145)
Total Bilirubin: 0.4 mg/dL (ref 0.2–1.2)
Total Protein: 7.5 g/dL (ref 6.0–8.3)

## 2024-02-19 MED ORDER — CARVEDILOL 12.5 MG PO TABS: 12.5000 mg | ORAL_TABLET | Freq: Two times a day (BID) | ORAL | 3 refills | Status: DC

## 2024-02-20 ENCOUNTER — Other Ambulatory Visit: Payer: Self-pay | Admitting: Nurse Practitioner

## 2024-02-20 DIAGNOSIS — E785 Hyperlipidemia, unspecified: Secondary | ICD-10-CM

## 2024-02-20 MED ORDER — ROSUVASTATIN CALCIUM 10 MG PO TABS: 10.0000 mg | ORAL_TABLET | Freq: Every day | ORAL | 3 refills | Status: AC

## 2024-03-03 ENCOUNTER — Ambulatory Visit (INDEPENDENT_AMBULATORY_CARE_PROVIDER_SITE_OTHER): Admitting: Family Medicine

## 2024-03-03 ENCOUNTER — Encounter: Payer: Self-pay | Admitting: Family Medicine

## 2024-03-03 VITALS — BP 113/72 | HR 70 | Temp 98.8°F | Resp 20 | Ht 73.0 in | Wt 201.5 lb

## 2024-03-03 DIAGNOSIS — J040 Acute laryngitis: Secondary | ICD-10-CM

## 2024-03-03 MED ORDER — PREDNISONE 20 MG PO TABS: 40.0000 mg | ORAL_TABLET | Freq: Every day | ORAL | 0 refills | Status: AC

## 2024-03-03 MED ORDER — HYDROCOD POLI-CHLORPHE POLI ER 10-8 MG/5ML PO SUER: 5.0000 mL | Freq: Two times a day (BID) | ORAL | 0 refills | Status: AC

## 2024-03-03 NOTE — Progress Notes (Unsigned)
 SUBJECTIVE:   Chief Complaint  Patient presents with   Cough   Laryngitis    Since last Tuesday   HPI Patient presents for acute visit  Discussed the use of AI scribe software for clinical note transcription with the patient, who gave verbal consent to proceed.  History of Present Illness Maria Hamilton is a 73 year old female who presents with cough and thick mucus production.  She has been experiencing a cough with thick mucus production for approximately one week. Over-the-counter mucus medicine has helped her expectorate mucus but has not resolved the issue. The cough is severe enough to cause hoarseness and a sore throat, which she attributes to frequent coughing. No fever, but she frequently feels cold. Her husband reassures her she does not have a fever. No chest pain or shortness of breath, but she notes chest soreness from coughing. She uses cough drops for her sore throat. Her appetite is decreased, but she maintains hydration with water and ginger ale. No muscle aches or exposure to illness. She experiences significant mucus accumulation in her throat, which she finds bothersome.   She has not received the flu or pneumonia vaccines due to past adverse reactions. She denies smoking and does not use nasal sprays. No known allergies and denies pain when swallowing.      PERTINENT PMH / PSH: As above  OBJECTIVE:  BP 113/72   Pulse 70   Temp 98.8 F (37.1 C)   Resp 20   Ht 6\' 1"  (1.854 m)   Wt 201 lb 8 oz (91.4 kg)   SpO2 100%   BMI 26.58 kg/m    Physical Exam Vitals reviewed.  Constitutional:      General: She is not in acute distress.    Appearance: Normal appearance. She is normal weight. She is not ill-appearing, toxic-appearing or diaphoretic.  HENT:     Right Ear: Tympanic membrane, ear canal and external ear normal.     Left Ear: Tympanic membrane, ear canal and external ear normal.     Mouth/Throat:     Mouth: Mucous membranes are moist.      Pharynx: Posterior oropharyngeal erythema present. No oropharyngeal exudate.  Eyes:     General:        Right eye: No discharge.        Left eye: No discharge.     Conjunctiva/sclera: Conjunctivae normal.  Neck:     Thyroid: No thyromegaly or thyroid tenderness.     Comments: Hoarseness of voice Cardiovascular:     Rate and Rhythm: Normal rate and regular rhythm.     Heart sounds: Normal heart sounds.  Pulmonary:     Effort: Pulmonary effort is normal.     Breath sounds: Normal breath sounds. No wheezing or rhonchi.  Abdominal:     General: Bowel sounds are normal.  Musculoskeletal:        General: Normal range of motion.  Lymphadenopathy:     Cervical: No cervical adenopathy.     Right cervical: No superficial, deep or posterior cervical adenopathy.    Left cervical: No superficial, deep or posterior cervical adenopathy.  Skin:    General: Skin is warm and dry.  Neurological:     General: No focal deficit present.     Mental Status: She is alert and oriented to person, place, and time. Mental status is at baseline.  Psychiatric:        Mood and Affect: Mood normal.  Behavior: Behavior normal.        Thought Content: Thought content normal.        Judgment: Judgment normal.           02/12/2024    9:13 AM 08/13/2023    1:14 PM 07/30/2023    8:02 AM 05/27/2023   12:31 PM 03/21/2023   12:54 PM  Depression screen PHQ 2/9  Decreased Interest 0 0 0 0 1  Down, Depressed, Hopeless 0 0 0 0 0  PHQ - 2 Score 0 0 0 0 1  Altered sleeping 0      Tired, decreased energy 1      Change in appetite 1      Feeling bad or failure about yourself  0      Trouble concentrating 0      Moving slowly or fidgety/restless 0      Suicidal thoughts 0      PHQ-9 Score 2      Difficult doing work/chores Not difficult at all          02/12/2024    9:14 AM 03/20/2023   10:23 AM 01/23/2023    8:57 AM 01/15/2023    2:02 PM  GAD 7 : Generalized Anxiety Score  Nervous, Anxious, on Edge 0 0 0 0   Control/stop worrying 0 0 0 0  Worry too much - different things 0 0 0 0  Trouble relaxing 0 0 0 1  Restless 0 0 0 0  Easily annoyed or irritable 0 0 0 0  Afraid - awful might happen 0 0 0 0  Total GAD 7 Score 0 0 0 1  Anxiety Difficulty Not difficult at all Not difficult at all Not difficult at all Not difficult at all    ASSESSMENT/PLAN:  Laryngitis, acute Assessment & Plan: Suspected viral laryngitis. Antibiotics not indicated. - Prescribed steroids for laryngitis. - Prescribed medication for cough. - Advised to maintain hydration. - Instructed to report worsening symptoms such as shortness of breath, fever, or chest pain.  Orders: -     predniSONE; Take 2 tablets (40 mg total) by mouth daily with breakfast for 5 days.  Dispense: 10 tablet; Refill: 0 -     Hydrocod Poli-Chlorphe Poli ER; Take 5 mLs by mouth 2 (two) times daily for 7 days.  Dispense: 70 mL; Refill: 0      General Health Maintenance Decision to decline influenza and pneumococcal vaccines acknowledged due to previous adverse reactions.    PDMP reviewed  Return if symptoms worsen or fail to improve, for PCP.  Dana Allan, MD

## 2024-03-03 NOTE — Patient Instructions (Signed)
 It was a pleasure meeting you today. Thank you for allowing me to take part in your health care.  Our goals for today as we discussed include:  Start Prednisone 40 mg daily for 5 days Tussinex 5 ml two times a day as needed for cough  Increase fluid intake, at least 32-64 oz daily  You can take Tylenol and/or Ibuprofen as needed for fever reduction and pain relief.   For cough: honey 1/2 to 1 teaspoon (you can dilute the honey in water or another fluid).  You can also use guaifenesin and dextromethorphan for cough. You can use a humidifier for chest congestion and cough.  If you don't have a humidifier, you can sit in the bathroom with the hot shower running.      For sore throat: try warm salt water gargles, cepacol lozenges, throat spray, warm tea or water with lemon/honey, popsicles or ice, or OTC cold relief medicine for throat discomfort.   For congestion: take a daily anti-histamine like Zyrtec, Claritin, and a oral decongestant, such as pseudoephedrine.  You can also use Flonase 1-2 sprays in each nostril daily.   It is important to stay hydrated: drink plenty of fluids (water, gatorade/powerade/pedialyte, juices, or teas) to keep your throat moisturized and help further relieve irritation/discomfort.    If symptoms worsen follow up with MD    This is a list of the screening recommended for you and due dates:  Health Maintenance  Topic Date Due   COVID-19 Vaccine (1) Never done   Zoster (Shingles) Vaccine (1 of 2) Never done   Pneumonia Vaccine (2 of 2 - PCV) 10/17/2017   DTaP/Tdap/Td vaccine (3 - Td or Tdap) 06/10/2022   Flu Shot  03/16/2024*   Medicare Annual Wellness Visit  08/06/2024   Mammogram  09/07/2024   Colon Cancer Screening  12/23/2031   DEXA scan (bone density measurement)  Completed   Hepatitis C Screening  Completed   HPV Vaccine  Aged Out  *Topic was postponed. The date shown is not the original due date.     If you have any questions or concerns,  please do not hesitate to call the office at [phone].  I look forward to our next visit and until then take care and stay safe.  Regards,   Dana Allan, MD   [facility]

## 2024-03-06 ENCOUNTER — Encounter: Payer: Self-pay | Admitting: Family Medicine

## 2024-03-06 DIAGNOSIS — J040 Acute laryngitis: Secondary | ICD-10-CM | POA: Insufficient documentation

## 2024-03-06 NOTE — Assessment & Plan Note (Signed)
 Suspected viral laryngitis. Antibiotics not indicated. - Prescribed steroids for laryngitis. - Prescribed medication for cough. - Advised to maintain hydration. - Instructed to report worsening symptoms such as shortness of breath, fever, or chest pain.

## 2024-03-09 ENCOUNTER — Ambulatory Visit: Payer: Self-pay

## 2024-03-09 NOTE — Telephone Encounter (Signed)
  Chief Complaint: hoarseness Symptoms: hoarseness, cough, green/yellow phlegm Frequency: since last week Pertinent Negatives: Patient denies sore throat, sob, fever Disposition: [] ED /[] Urgent Care (no appt availability in office) / [x] Appointment(In office/virtual)/ []  [facility]/ [] Home Care/ [x] Refused Recommended Disposition /[] [facility]/ []  Follow-up with PCP Additional Notes: Patient was seen in office last week 3/18 for laryngitis and was prescribed Tussionex. Patient reports she has completed this and is still experiencing hoarseness, cough, and green/yellow phlegm. Patient reports she was advised by PCP when seen to call back in for a different medication if no relief in symptoms. Patient requesting new medication at this time. Advised would forward request to appropriate person for follow-up and to call with worsening symptoms. Patient verbalized understanding.    Copied from CRM [id number]. Topic: Clinical - Red Word Triage >> Mar 09, 2024 10:57 AM Theodis Sato wrote: Red Word that prompted transfer to Nurse Triage: Green/yellow Phlegm - Hoarseness Reason for Disposition  Hoarseness lasts > 2 weeks  Answer Assessment - Initial Assessment Questions 1. DESCRIPTION: "Describe your voice." (e.g., coarse, raspy, weaker, airy, scratchy, deeper)     airy 2. SEVERITY: "How bad is it?"   - MILD: doesn't interfere with normal activities   - MODERATE: interferes with normal activities such as school or work   - SEVERE: only able to whisper.     severe 3. ONSET: "When did the hoarseness begin?"     Last week 4. COUGH: "Is there a cough?" If Yes, ask: "How bad is it?"     yes 5. FEVER: "Do you have a fever?" If Yes, ask: "What is your temperature, how was it measured, and when did it start?"     none 6. ALLERGIES: "Any allergy symptoms?" If Yes, ask: "What are they?"     none 7. IRRITANTS: "Do you smoke?" "Have you been exposed to any irritating fumes?" (e.g.,  smoke)     none 8. CAUSE: "What do you think is causing the hoarseness?"     Sick last week 9. OTHER SYMPTOMS: "Do you have any other symptoms?" (e.g., breathing difficulty, fever, foreign body, lymph node swelling in neck, rash, sore throat, weight loss)     Hoarseness, cough, green/yellow phlegm  Protocols used: Hoarseness-A-AH

## 2024-03-10 ENCOUNTER — Telehealth: Payer: Self-pay

## 2024-03-10 NOTE — Telephone Encounter (Signed)
 Copied from CRM [id number]. Topic: Clinical - Red Word Triage >> Mar 09, 2024 10:57 AM Theodis Sato wrote: Red Word that prompted transfer to Nurse Triage: Green/yellow Phlegm - Hoarseness >> Mar 10, 2024  8:07 AM Fonda Kinder J wrote: The pt states she spoke with someone yesterday and was told a new prescription would be written for this matter. I did not see a new prescription on her med list and the pt called in this morning to see if it had been completed, please advise

## 2024-03-12 NOTE — Telephone Encounter (Signed)
 Spoke to pt. Pt declined an appointment at this time. Pt stated she will not be returning to the clinic for re-eval and would find a new doctor. Asked pt 3 times to ensure she didn't want to be see for her symptoms. Pt verbalized she wasn't going to come

## 2024-03-12 NOTE — Telephone Encounter (Signed)
 Called pt and she stated that Dr. Clent Ridges would send in medication, and stated that she is not coming up. She stated that if she can not help her she will find another dr. She stated that in all her time with Dr. Birdie Sons she has never had this much trouble.

## 2024-04-01 ENCOUNTER — Encounter: Payer: Self-pay | Admitting: Nurse Practitioner

## 2024-04-01 ENCOUNTER — Other Ambulatory Visit (INDEPENDENT_AMBULATORY_CARE_PROVIDER_SITE_OTHER)

## 2024-04-01 DIAGNOSIS — E785 Hyperlipidemia, unspecified: Secondary | ICD-10-CM

## 2024-04-01 LAB — LIPID PANEL
Cholesterol: 142 mg/dL (ref 0–200)
HDL: 60.8 mg/dL (ref >39.00)
LDL Cholesterol: 65 mg/dL (ref 0–99)
NonHDL: 81.38
Total CHOL/HDL Ratio: 2
Triglycerides: 82 mg/dL (ref 0.0–149.0)
VLDL: 16.4 mg/dL (ref 0.0–40.0)

## 2024-04-01 LAB — HEPATIC FUNCTION PANEL
ALT: 8 U/L (ref 0–35)
AST: 15 U/L (ref 0–37)
Albumin: 4.2 g/dL (ref 3.5–5.2)
Alkaline Phosphatase: 108 U/L (ref 39–117)
Bilirubin, Direct: 0.1 mg/dL (ref 0.0–0.3)
Total Bilirubin: 0.4 mg/dL (ref 0.2–1.2)
Total Protein: 7.5 g/dL (ref 6.0–8.3)

## 2024-04-21 ENCOUNTER — Ambulatory Visit (INDEPENDENT_AMBULATORY_CARE_PROVIDER_SITE_OTHER): Admitting: *Deleted

## 2024-04-21 VITALS — Ht 73.0 in | Wt 201.0 lb

## 2024-04-21 DIAGNOSIS — Z0000 Encounter for general adult medical examination without abnormal findings: Secondary | ICD-10-CM

## 2024-04-21 NOTE — Progress Notes (Signed)
 Subjective:   Maria Hamilton is a 73 y.o. who presents for a Medicare Wellness preventive visit.  Visit Complete: Virtual I connected with  Danice Dural on 04/21/24 by a audio enabled telemedicine application and verified that I am speaking with the correct person using two identifiers.  Patient Location: Home  Provider Location: Office/Clinic  I discussed the limitations of evaluation and management by telemedicine. The patient expressed understanding and agreed to proceed.  Vital Signs: Because this visit was a virtual/telehealth visit, some criteria may be missing or patient reported. Any vitals not documented were not able to be obtained and vitals that have been documented are patient reported.  VideoDeclined- This patient declined Librarian, academic. Therefore the visit was completed with audio only.  Persons Participating in Visit: Patient.  AWV Questionnaire: No: Patient Medicare AWV questionnaire was not completed prior to this visit.  Cardiac Risk Factors include: advanced age (>68men, >55 women);dyslipidemia;hypertension     Objective:    Today's Vitals   04/21/24 0814  Weight: 201 lb (91.2 kg)  Height: 6\' 1"  (1.854 m)  PainSc: 7    Body mass index is 26.52 kg/m.     04/21/2024    8:34 AM 10/10/2023    5:11 PM 10/03/2023    6:30 AM 09/20/2023    8:50 AM 08/13/2023    1:14 PM 07/30/2023    8:02 AM 07/23/2023    7:53 AM  Advanced Directives  Does Patient Have a Medical Advance Directive? No No No No No No No  Would patient like information on creating a medical advance directive? No - Patient declined  No - Patient declined  No - Patient declined  No - Patient declined    Current Medications (verified) Outpatient Encounter Medications as of 04/21/2024  Medication Sig   amLODipine  (NORVASC ) 10 MG tablet TAKE 1 TABLET EVERY DAY   Calcium  Carb-Cholecalciferol  (CALCIUM  600/VITAMIN D3) 600-20 MG-MCG TABS Take 1 tablet by mouth  daily.   carvedilol  (COREG ) 12.5 MG tablet Take 1 tablet (12.5 mg total) by mouth 2 (two) times daily with a meal.   DULoxetine  (CYMBALTA ) 60 MG capsule TAKE 1 CAPSULE EVERY DAY   gabapentin  (NEURONTIN ) 600 MG tablet Take 1 tablet (600 mg total) by mouth at bedtime. (Patient not taking: Reported on 04/21/2024)   potassium chloride  SA (KLOR-CON  M) 20 MEQ tablet TAKE 1 TABLET EVERY DAY   rosuvastatin  (CRESTOR ) 10 MG tablet Take 1 tablet (10 mg total) by mouth daily.   spironolactone  (ALDACTONE ) 25 MG tablet TAKE 1/2 TABLET EVERY MORNING   tiZANidine  (ZANAFLEX ) 2 MG tablet TAKE 2 TABLETS BY MOUTH AT BEDTIME AS NEEDED FOR MUSCLE SPASMS   No facility-administered encounter medications on file as of 04/21/2024.    Allergies (verified) Ace inhibitors, Ciprofloxacin , Penicillins, Fentanyl , Hctz [hydrochlorothiazide ], Latex, Morphine  and codeine , Isovue  m [iopamidol ], Penicillin g benzathine, and Tape   History: Past Medical History:  Diagnosis Date   Acute anxiety 08/03/2015   Overview:  Last Assessment & Plan:  Klonopin  most helpful  Reports past taking it 1 tab am 1/2 tab midday and 1 tab pm  30 day supply faxed to pharmacy   Acute bronchitis 12/31/2016   Acute pancreatitis 10/14/2016   Altered mental status 10/12/2016   Anxiety    Aortic atherosclerosis (HCC)    Bronchitis    Chronic hip pain    Chronic pain syndrome    Depression    Edema    Fatigue    Fibromyalgia  GERD (gastroesophageal reflux disease)    Headache    Hypertension    Hypokalemia    Insomnia    Low back pain    Lumbar facet arthropathy    Polyneuropathy    Right upper quadrant abdominal pain    Vitamin D  deficiency    Past Surgical History:  Procedure Laterality Date   BACK SURGERY     x 5 (lumbar)   BREAST BIOPSY Left 90s   benign   CATARACT EXTRACTION, BILATERAL     CHOLECYSTECTOMY N/A 10/16/2016   Procedure: LAPAROSCOPIC CHOLECYSTECTOMY WITH INTRAOPERATIVE CHOLANGIOGRAM;  Surgeon: Rhina Center III, MD;   Location: [facility] ORS;  Service: General;  Laterality: N/A;   COLONOSCOPY WITH PROPOFOL  N/A 12/22/2021   Procedure: COLONOSCOPY WITH PROPOFOL ;  Surgeon: Luke Salaam, MD;  Location: [facility] ENDOSCOPY;  Service: Gastroenterology;  Laterality: N/A;   cyst removal from wrist     ECTOPIC PREGNANCY SURGERY     LAPAROSCOPIC VAGINAL HYSTERECTOMY WITH SALPINGO OOPHORECTOMY     LUMBAR FUSION     L3-4;  L4-5   THORACIC LAMINECTOMY FOR SPINAL CORD STIMULATOR N/A 10/03/2023   Procedure: THORACIC LAMINECTOMY FOR SPINAL CORD STIMULATOR PLACEMENT (MEDTRONIC);  Surgeon: Carroll Clamp, MD;  Location: [facility] ORS;  Service: Neurosurgery;  Laterality: N/A;   TOTAL HIP ARTHROPLASTY Bilateral    TOTAL HIP REVISION Left    Family History  Problem Relation Age of Onset   Heart disease Mother    Cancer Father    Alcohol abuse Father    Social History   Socioeconomic History   Marital status: Married    Spouse name: Lovette Rud   Number of children: 2   Years of education: Not on file   Highest education level: Not on file  Occupational History   Not on file  Tobacco Use   Smoking status: Never   Smokeless tobacco: Never  Vaping Use   Vaping status: Never Used  Substance and Sexual Activity   Alcohol use: No    Alcohol/week: 0.0 standard drinks of alcohol   Drug use: No   Sexual activity: Yes  Other Topics Concern   Not on file  Social History Narrative   Married   Social Drivers of Health   Financial Resource Strain: Low Risk  (04/21/2024)   Overall Financial Resource Strain (CARDIA)    Difficulty of Paying Living Expenses: Not hard at all  Food Insecurity: No Food Insecurity (04/21/2024)   Hunger Vital Sign    Worried About Running Out of Food in the Last Year: Never true    Ran Out of Food in the Last Year: Never true  Transportation Needs: No Transportation Needs (04/21/2024)   PRAPARE - Administrator, Civil Service (Medical): No    Lack of Transportation (Non-Medical): No  Physical  Activity: Sufficiently Active (04/21/2024)   Exercise Vital Sign    Days of Exercise per Week: 6 days    Minutes of Exercise per Session: 60 min  Stress: No Stress Concern Present (04/21/2024)   [employer] of Occupational Health - Occupational Stress Questionnaire    Feeling of Stress : Only a little  Social Connections: Moderately Integrated (04/21/2024)   Social Connection and Isolation Panel [NHANES]    Frequency of Communication with Friends and Family: More than three times a week    Frequency of Social Gatherings with Friends and Family: More than three times a week    Attends Religious Services: More than 4 times per year    Active  Member of Clubs or Organizations: No    Attends Engineer, structural: Never    Marital Status: Married    Tobacco Counseling Counseling given: Not Answered    Clinical Intake:  Pre-visit preparation completed: Yes  Pain : 0-10 Pain Score: 7  Pain Type: Chronic pain Pain Location: Back Pain Descriptors / Indicators: Throbbing Pain Onset: More than a month ago Pain Frequency: Constant     BMI - recorded: 26.52 Nutritional Status: BMI 25 -29 Overweight Nutritional Risks: None Diabetes: No  Lab Results  Component Value Date   HGBA1C 5.9 02/19/2024   HGBA1C 5.5 08/07/2019     How often do you need to have someone help you when you read instructions, pamphlets, or other written materials from your doctor or pharmacy?: 1 - Never  Interpreter Needed?: No  Information entered by :: R. Ainara Eldridge LPN   Activities of Daily Living     04/21/2024    8:16 AM 09/20/2023    8:37 AM  In your present state of health, do you have any difficulty performing the following activities:  Hearing? 0 0  Vision? 0 0  Comment readers   Difficulty concentrating or making decisions? 0 0  Walking or climbing stairs? 1   Dressing or bathing? 0   Doing errands, shopping? 1 0  Preparing Food and eating ? N   Using the Toilet? N   In the past six  months, have you accidently leaked urine? N   Do you have problems with loss of bowel control? N   Managing your Medications? N   Managing your Finances? N   Housekeeping or managing your Housekeeping? N     Patient Care Team: Renaldo Caroli, MD as Consulting Physician (Pain Medicine)  Indicate any recent Medical Services you may have received from other than [facility] providers in the past year (date may be approximate).     Assessment:   This is a routine wellness examination for Chacra.  Hearing/Vision screen Hearing Screening - Comments:: No issues Vision Screening - Comments:: readers   Goals Addressed             This Visit's Progress    Patient Stated       Wants to try to eat  better       Depression Screen     04/21/2024    8:29 AM 02/12/2024    9:13 AM 08/13/2023    1:14 PM 07/30/2023    8:02 AM 05/27/2023   12:31 PM 03/21/2023   12:54 PM 03/20/2023   10:20 AM  PHQ 2/9 Scores  PHQ - 2 Score 0 0 0 0 0 1 0  PHQ- 9 Score 2 2     0    Fall Risk     04/21/2024    8:20 AM 02/12/2024    9:13 AM 08/13/2023    1:14 PM 07/30/2023    8:02 AM 07/23/2023    7:53 AM  Fall Risk   Falls in the past year? 0 1 0 0 0  Number falls in past yr: 0 0 0    Injury with Fall? 0 0 0    Risk for fall due to : No Fall Risks History of fall(s)     Follow up Falls prevention discussed;Falls evaluation completed Falls evaluation completed       MEDICARE RISK AT HOME:  Medicare Risk at Home Any stairs in or around the home?: Yes If so, are there any without handrails?: No Home  free of loose throw rugs in walkways, pet beds, electrical cords, etc?: Yes Adequate lighting in your home to reduce risk of falls?: Yes Life alert?: No Use of a cane, walker or w/c?: No Grab bars in the bathroom?: Yes Shower chair or bench in shower?: Yes Elevated toilet seat or a handicapped toilet?: Yes  TIMED UP AND GO:  Was the test performed?  No  Cognitive Function: 6CIT completed         04/21/2024    8:34 AM 02/22/2023   11:25 AM 12/01/2021    2:34 PM 01/25/2021   12:50 PM 01/25/2020   12:43 PM  6CIT Screen  What Year? 0 points 0 points 0 points 0 points 0 points  What month? 0 points 0 points  0 points 0 points  What time? 0 points 0 points  0 points 0 points  Count back from 20 0 points 0 points  0 points 0 points  Months in reverse 0 points 0 points  0 points 2 points  Repeat phrase 2 points 0 points     Total Score 2 points 0 points       Immunizations Immunization History  Administered Date(s) Administered   Influenza, High Dose Seasonal PF 10/16/2017   Influenza,inj,Quad PF,6+ Mos 10/17/2016   Influenza-Unspecified 10/29/2012   Pneumococcal Polysaccharide-23 10/17/2016   Td 09/09/2001   Tdap 06/10/2012    Screening Tests Health Maintenance  Topic Date Due   COVID-19 Vaccine (1) Never done   Zoster Vaccines- Shingrix (1 of 2) Never done   Pneumonia Vaccine 59+ Years old (2 of 2 - PCV) 10/17/2017   DTaP/Tdap/Td (3 - Td or Tdap) 06/10/2022   INFLUENZA VACCINE  07/17/2024   MAMMOGRAM  09/07/2024   Medicare Annual Wellness (AWV)  04/21/2025   Colonoscopy  12/23/2031   DEXA SCAN  Completed   Hepatitis C Screening  Completed   HPV VACCINES  Aged Out   Meningococcal B Vaccine  Aged Out    Health Maintenance  Health Maintenance Due  Topic Date Due   COVID-19 Vaccine (1) Never done   Zoster Vaccines- Shingrix (1 of 2) Never done   Pneumonia Vaccine 30+ Years old (2 of 2 - PCV) 10/17/2017   DTaP/Tdap/Td (3 - Td or Tdap) 06/10/2022   Health Maintenance Items Addressed: Patient declines vaccines  Additional Screening:  Vision Screening: Recommended annual ophthalmology exams for early detection of glaucoma and other disorders of the eye. Up to date Dr. Rosan Comfort  Dental Screening: Recommended annual dental exams for proper oral hygiene  Community Resource Referral / Chronic Care Management: CRR required this visit?  No   CCM required this visit?   No     Plan:     I have personally reviewed and noted the following in the patient's chart:   Medical and social history Use of alcohol, tobacco or illicit drugs  Current medications and supplements including opioid prescriptions. Patient is not currently taking opioid prescriptions. Functional ability and status Nutritional status Physical activity Advanced directives List of other physicians Hospitalizations, surgeries, and ER visits in previous 12 months Vitals Screenings to include cognitive, depression, and falls Referrals and appointments  In addition, I have reviewed and discussed with patient certain preventive protocols, quality metrics, and best practice recommendations. A written personalized care plan for preventive services as well as general preventive health recommendations were provided to patient.     Felicitas Horse, LPN   0/08/8118   After Visit Summary: (MyChart) Due to this being  a telephonic visit, the after visit summary with patients personalized plan was offered to patient via MyChart   Notes: Nothing significant to report at this time.

## 2024-04-21 NOTE — Patient Instructions (Signed)
 Ms. Zee , Thank you for taking time to come for your Medicare Wellness Visit. I appreciate your ongoing commitment to your health goals. Please review the following plan we discussed and let me know if I can assist you in the future.   Referrals/Orders/Follow-Ups/Clinician Recommendations: Consider updating your vaccines.  This is a list of the screening recommended for you and due dates:  Health Maintenance  Topic Date Due   COVID-19 Vaccine (1) Never done   Zoster (Shingles) Vaccine (1 of 2) Never done   Pneumonia Vaccine (2 of 2 - PCV) 10/17/2017   DTaP/Tdap/Td vaccine (3 - Td or Tdap) 06/10/2022   Flu Shot  07/17/2024   Mammogram  09/07/2024   Medicare Annual Wellness Visit  04/21/2025   Colon Cancer Screening  12/23/2031   DEXA scan (bone density measurement)  Completed   Hepatitis C Screening  Completed   HPV Vaccine  Aged Out   Meningitis B Vaccine  Aged Out    Advanced directives: (Declined) Advance directive discussed with you today. Even though you declined this today, please call our office should you change your mind, and we can give you the proper paperwork for you to fill out.  Next Medicare Annual Wellness Visit scheduled for next year: Yes 04/26/25 @ 8:50  Have you seen your provider in the last 6 months (3 months if uncontrolled diabetes)? Yes 03/03/24

## 2024-04-24 ENCOUNTER — Encounter: Payer: Self-pay | Admitting: Family Medicine

## 2024-04-24 ENCOUNTER — Ambulatory Visit: Admitting: Family Medicine

## 2024-04-24 VITALS — BP 146/88 | HR 66 | Temp 97.6°F | Ht 73.0 in | Wt 205.8 lb

## 2024-04-24 DIAGNOSIS — M7918 Myalgia, other site: Secondary | ICD-10-CM | POA: Diagnosis not present

## 2024-04-24 DIAGNOSIS — G894 Chronic pain syndrome: Secondary | ICD-10-CM | POA: Diagnosis not present

## 2024-04-24 DIAGNOSIS — I10 Essential (primary) hypertension: Secondary | ICD-10-CM | POA: Diagnosis not present

## 2024-04-24 DIAGNOSIS — G629 Polyneuropathy, unspecified: Secondary | ICD-10-CM | POA: Diagnosis not present

## 2024-04-24 DIAGNOSIS — G8929 Other chronic pain: Secondary | ICD-10-CM

## 2024-04-24 DIAGNOSIS — M5442 Lumbago with sciatica, left side: Secondary | ICD-10-CM

## 2024-04-24 DIAGNOSIS — M792 Neuralgia and neuritis, unspecified: Secondary | ICD-10-CM

## 2024-04-24 MED ORDER — POTASSIUM CHLORIDE CRYS ER 20 MEQ PO TBCR: 20.0000 meq | EXTENDED_RELEASE_TABLET | Freq: Every day | ORAL | 0 refills | Status: DC

## 2024-04-24 MED ORDER — SPIRONOLACTONE 25 MG PO TABS: 12.5000 mg | ORAL_TABLET | Freq: Every day | ORAL | 3 refills | Status: AC

## 2024-04-24 MED ORDER — TIZANIDINE HCL 2 MG PO TABS: ORAL_TABLET | ORAL | 0 refills | Status: DC

## 2024-04-24 MED ORDER — TIZANIDINE HCL 2 MG PO TABS
2.0000 mg | ORAL_TABLET | Freq: Every evening | ORAL | 2 refills | Status: DC | PRN
Start: 2024-05-24 — End: 2024-06-18

## 2024-04-24 MED ORDER — DULOXETINE HCL 60 MG PO CPEP: 60.0000 mg | ORAL_CAPSULE | Freq: Every day | ORAL | 0 refills | Status: AC

## 2024-04-24 NOTE — Patient Instructions (Addendum)
 It was a pleasure meeting you today. Thank you for allowing me to take part in your health care.  Our goals for today as we discussed include:  Refills sent for requested medications  Ask [facility] about refill for Gabapentin .  Should have some on file.   This is a list of the screening recommended for you and due dates:  Health Maintenance  Topic Date Due   COVID-19 Vaccine (1) Never done   Zoster (Shingles) Vaccine (1 of 2) Never done   Pneumonia Vaccine (2 of 2 - PCV) 10/17/2017   DTaP/Tdap/Td vaccine (3 - Td or Tdap) 06/10/2022   Flu Shot  07/17/2024   Mammogram  09/07/2024   Medicare Annual Wellness Visit  04/21/2025   Colon Cancer Screening  12/23/2031   DEXA scan (bone density measurement)  Completed   Hepatitis C Screening  Completed   HPV Vaccine  Aged Out   Meningitis B Vaccine  Aged Out      If you have any questions or concerns, please do not hesitate to call the office at [phone].  I look forward to our next visit and until then take care and stay safe.  Regards,   Valli Gaw, MD   [facility]

## 2024-04-24 NOTE — Progress Notes (Unsigned)
 SUBJECTIVE:   Chief Complaint  Patient presents with   Medical Management of Chronic Issues   HPI Presents for follow up chronic disease management  Discussed the use of AI scribe software for clinical note transcription with the patient, who gave verbal consent to proceed.  History of Present Illness Maria Hamilton is a 73 year old female who presents for medication refills.  She has been out of her medications, Talzenna and gabapentin , for about a month. She usually receives them through mail order but requires a temporary supply from the pharmacy until the mail order arrives.  She takes gabapentin  600 mg every night for joint pain, which is constant and affects her left leg. She has a history of six back surgeries and finds that gabapentin  and Talzenna are the only medications that alleviate her pain. She also takes Cymbalta  in the morning, which she associates with blood pressure management, and recently received a refill through the mail.  Her blood pressure is usually low and normal when she takes her medications, but she did not take them this morning due to transportation issues. She feels agitated and frustrated, attributing these feelings to her current situation. No chest pain or shortness of breath. She experiences difficulty sleeping at night due to chronic pain.  She has previously consulted a pain management specialist for her chronic pain issues.     PERTINENT PMH / PSH: As above   OBJECTIVE:  BP (!) 146/88   Pulse 66   Temp 97.6 F (36.4 C) (Oral)   Ht 6\' 1"  (1.854 m)   Wt 205 lb 12.8 oz (93.4 kg)   SpO2 99%   BMI 27.15 kg/m    Physical Exam        04/21/2024    8:29 AM 02/12/2024    9:13 AM 08/13/2023    1:14 PM 07/30/2023    8:02 AM 05/27/2023   12:31 PM  Depression screen PHQ 2/9  Decreased Interest 0 0 0 0 0  Down, Depressed, Hopeless 0 0 0 0 0  PHQ - 2 Score 0 0 0 0 0  Altered sleeping 1 0     Tired, decreased energy 0 1     Change in  appetite 1 1     Feeling bad or failure about yourself  0 0     Trouble concentrating 0 0     Moving slowly or fidgety/restless  0     Suicidal thoughts 0 0     PHQ-9 Score 2 2     Difficult doing work/chores Not difficult at all Not difficult at all         02/12/2024    9:14 AM 03/20/2023   10:23 AM 01/23/2023    8:57 AM 01/15/2023    2:02 PM  GAD 7 : Generalized Anxiety Score  Nervous, Anxious, on Edge 0 0 0 0  Control/stop worrying 0 0 0 0  Worry too much - different things 0 0 0 0  Trouble relaxing 0 0 0 1  Restless 0 0 0 0  Easily annoyed or irritable 0 0 0 0  Afraid - awful might happen 0 0 0 0  Total GAD 7 Score 0 0 0 1  Anxiety Difficulty Not difficult at all Not difficult at all Not difficult at all Not difficult at all    ASSESSMENT/PLAN:  Chronic pain syndrome Assessment & Plan: Chronic pain persists, primarily affecting joints and left leg. Effective management with gabapentin  and Zanaflex  noted. Recent  discontinuation may contribute to increased pain. - Refill Zanaflex  for one month. - Gabapentin  refill too early    Polyneuropathy -     DULoxetine  HCl; Take 1 capsule (60 mg total) by mouth daily.  Dispense: 90 capsule; Refill: 0  Neurogenic pain -     DULoxetine  HCl; Take 1 capsule (60 mg total) by mouth daily.  Dispense: 90 capsule; Refill: 0  Primary hypertension Assessment & Plan: Elevated today.  Hypertension well-controlled at home. Missed medication dose today and agitation may affect current reading. -Continue Carvedilol , Spironolactone  and Amlodipine  as prescribed  Orders: -     Potassium Chloride  [facility]; Take 1 tablet (20 mEq total) by mouth daily.  Dispense: 90 tablet; Refill: 0 -     Spironolactone ; Take 0.5 tablets (12.5 mg total) by mouth daily.  Dispense: 45 tablet; Refill: 3  Musculoskeletal pain -     tiZANidine  HCl; TAKE 2 TABLETS BY MOUTH AT BEDTIME AS NEEDED FOR MUSCLE SPASMS  Dispense: 30 tablet; Refill: 0 -     tiZANidine  HCl; Take 1  tablet (2 mg total) by mouth at bedtime as needed for muscle spasms.  Dispense: 30 tablet; Refill: 2  Chronic low back pain (2ry area of Pain) (Left) w/ sciatica (Left) -     tiZANidine  HCl; TAKE 2 TABLETS BY MOUTH AT BEDTIME AS NEEDED FOR MUSCLE SPASMS  Dispense: 30 tablet; Refill: 0 -     tiZANidine  HCl; Take 1 tablet (2 mg total) by mouth at bedtime as needed for muscle spasms.  Dispense: 30 tablet; Refill: 2       PDMP reviewed  Return for PCP.  Valli Gaw, MD

## 2024-04-28 NOTE — Assessment & Plan Note (Signed)
 Elevated today.  Hypertension well-controlled at home. Missed medication dose today and agitation may affect current reading. -Continue Carvedilol , Spironolactone  and Amlodipine  as prescribed

## 2024-04-28 NOTE — Assessment & Plan Note (Signed)
 Chronic pain persists, primarily affecting joints and left leg. Effective management with gabapentin  and Zanaflex  noted. Recent discontinuation may contribute to increased pain. - Refill Zanaflex  for one month. - Gabapentin  refill too early

## 2024-05-04 ENCOUNTER — Encounter: Payer: Self-pay | Admitting: Family Medicine

## 2024-05-04 ENCOUNTER — Ambulatory Visit (INDEPENDENT_AMBULATORY_CARE_PROVIDER_SITE_OTHER): Admitting: Family Medicine

## 2024-05-04 VITALS — BP 124/60 | HR 58 | Temp 98.2°F | Resp 20 | Ht 73.0 in | Wt 205.0 lb

## 2024-05-04 DIAGNOSIS — L258 Unspecified contact dermatitis due to other agents: Secondary | ICD-10-CM | POA: Diagnosis not present

## 2024-05-04 MED ORDER — PREDNISONE 20 MG PO TABS
20.0000 mg | ORAL_TABLET | Freq: Every day | ORAL | 0 refills | Status: AC
Start: 2024-05-04 — End: 2024-05-09

## 2024-05-04 MED ORDER — HYDROXYZINE HCL 10 MG PO TABS
10.0000 mg | ORAL_TABLET | Freq: Every evening | ORAL | 0 refills | Status: AC | PRN
Start: 2024-05-04 — End: ?

## 2024-05-04 NOTE — Patient Instructions (Addendum)
 It was a pleasure meeting you today. Thank you for allowing me to take part in your health care.  Our goals for today as we discussed include:  Start Prednisone  20 mg daily for 5 days Start Atarax  10 mg at night.  Calamine lotion as needed  If no improvement follow up with PCP    This is a list of the screening recommended for you and due dates:  Health Maintenance  Topic Date Due   COVID-19 Vaccine (1) Never done   Zoster (Shingles) Vaccine (1 of 2) Never done   Pneumonia Vaccine (2 of 2 - PCV) 10/17/2017   DTaP/Tdap/Td vaccine (3 - Td or Tdap) 06/10/2022   Flu Shot  07/17/2024   Mammogram  09/07/2024   Medicare Annual Wellness Visit  04/21/2025   Colon Cancer Screening  12/23/2031   DEXA scan (bone density measurement)  Completed   Hepatitis C Screening  Completed   HPV Vaccine  Aged Out   Meningitis B Vaccine  Aged Out     If you have any questions or concerns, please do not hesitate to call the office at [phone].  I look forward to our next visit and until then take care and stay safe.  Regards,   Valli Gaw, MD   [facility]

## 2024-05-04 NOTE — Progress Notes (Unsigned)
 SUBJECTIVE:   Chief Complaint  Patient presents with   Rash    Itches and when she touch other part of body it spreads X 3 days after working in the yard   HPI Presents for acute visit  Discussed the use of AI scribe software for clinical note transcription with the patient, who gave verbal consent to proceed.  History of Present Illness Maria Hamilton is a 73 year old female who presents with a pruritic rash after suspected contact with poison ivy or poison oak.  She developed the rash after working in the yard on Thursday, Apr 30, 2024, suspecting contact with poison ivy or poison oak while pulling grass. The rash initially appeared on her arm and has since spread to her leg and chin. She describes the itching as severe, stating 'I can't stand it.'  She has attempted self-treatment with Benadryl , cream, and calamine lotion, but reports no improvement. She is concerned about the rash spreading further, particularly to her face, and notes that it is not blistering.  No fever or new medication use. She has no known allergies and reports that no one else in her household has developed similar symptoms. She confirms that the rash is not present on her back.     PERTINENT PMH / PSH: As above  OBJECTIVE:  BP 124/60   Pulse (!) 58   Temp 98.2 F (36.8 C)   Resp 20   Ht 6\' 1"  (1.854 m)   Wt 205 lb (93 kg)   SpO2 99%   BMI 27.05 kg/m    Physical Exam Vitals reviewed.  Constitutional:      General: She is not in acute distress.    Appearance: Normal appearance. She is normal weight. She is not ill-appearing, toxic-appearing or diaphoretic.  Eyes:     General:        Right eye: No discharge.        Left eye: No discharge.     Conjunctiva/sclera: Conjunctivae normal.  Cardiovascular:     Rate and Rhythm: Normal rate.  Pulmonary:     Effort: Pulmonary effort is normal.  Musculoskeletal:        General: Normal range of motion.  Skin:    General: Skin is warm and dry.      Findings: Rash present. No erythema. Rash is urticarial. Rash is not vesicular.  Neurological:     General: No focal deficit present.     Mental Status: She is alert and oriented to person, place, and time. Mental status is at baseline.  Psychiatric:        Mood and Affect: Mood normal.        Behavior: Behavior normal.        Thought Content: Thought content normal.        Judgment: Judgment normal.           05/04/2024   10:48 AM 04/21/2024    8:29 AM 02/12/2024    9:13 AM 08/13/2023    1:14 PM 07/30/2023    8:02 AM  Depression screen PHQ 2/9  Decreased Interest 0 0 0 0 0  Down, Depressed, Hopeless 0 0 0 0 0  PHQ - 2 Score 0 0 0 0 0  Altered sleeping 1 1 0    Tired, decreased energy 0 0 1    Change in appetite 0 1 1    Feeling bad or failure about yourself  0 0 0    Trouble concentrating 0 0  0    Moving slowly or fidgety/restless 0  0    Suicidal thoughts 0 0 0    PHQ-9 Score 1 2 2     Difficult doing work/chores Not difficult at all Not difficult at all Not difficult at all        05/04/2024   10:48 AM 02/12/2024    9:14 AM 03/20/2023   10:23 AM 01/23/2023    8:57 AM  GAD 7 : Generalized Anxiety Score  Nervous, Anxious, on Edge 0 0 0 0  Control/stop worrying 0 0 0 0  Worry too much - different things 0 0 0 0  Trouble relaxing 0 0 0 0  Restless 0 0 0 0  Easily annoyed or irritable 0 0 0 0  Afraid - awful might happen 0 0 0 0  Total GAD 7 Score 0 0 0 0  Anxiety Difficulty Not difficult at all Not difficult at all Not difficult at all Not difficult at all    ASSESSMENT/PLAN:  Contact dermatitis due to other agent, unspecified contact dermatitis type Assessment & Plan: Acute contact dermatitis likely from yard work exposure. Symptoms persistent despite OTC treatments. Prednisone  expected to reduce inflammation. Hydroxyzine  for sleep and itching. - Prescribe prednisone  for 5 days. - Prescribe hydroxyzine  10 mg at night. - Advise to avoid touching face. - Instruct to  return if no improvement after 5 days.  Orders: -     predniSONE ; Take 1 tablet (20 mg total) by mouth daily with breakfast for 5 days.  Dispense: 5 tablet; Refill: 0 -     hydrOXYzine  HCl; Take 1 tablet (10 mg total) by mouth at bedtime as needed.  Dispense: 15 tablet; Refill: 0    PDMP reviewed  Return if symptoms worsen or fail to improve, for PCP.  Valli Gaw, MD

## 2024-05-07 ENCOUNTER — Ambulatory Visit: Payer: Self-pay

## 2024-05-07 NOTE — Telephone Encounter (Signed)
 Noted

## 2024-05-07 NOTE — Telephone Encounter (Signed)
 This RN made the third attempt to contact the pt. Pt did not answer, RN LVM with a callback number. RN will route to the office for follow-up. Reason for Disposition . Third attempt to contact caller AND no contact made. Phone number verified.  Protocols used: No Contact or Duplicate Contact Call-A-AH

## 2024-05-07 NOTE — Telephone Encounter (Signed)
 This RN made the first attempt to contact the pt. Pt did not answer. RN LVM with a callback number.   Copied from CRM [id number]. Topic: Clinical - Prescription Issue >> May 07, 2024  8:25 AM Maria Hamilton wrote: Reason for CRM: Patient states she was reccommended by her provider to use Calamine lotion for a current rash. Patient states that the medication is not working and she is still itching. Patient is requesting a new medication be sent in to relieve her itchiness.

## 2024-05-07 NOTE — Telephone Encounter (Signed)
 This RN made a second attempt to contact the pt. Pt did not answer, RN LVM with a callback number.

## 2024-05-10 ENCOUNTER — Encounter: Payer: Self-pay | Admitting: Family Medicine

## 2024-05-10 DIAGNOSIS — L259 Unspecified contact dermatitis, unspecified cause: Secondary | ICD-10-CM | POA: Insufficient documentation

## 2024-05-10 NOTE — Assessment & Plan Note (Signed)
 Acute contact dermatitis likely from yard work exposure. Symptoms persistent despite OTC treatments. Prednisone  expected to reduce inflammation. Hydroxyzine  for sleep and itching. - Prescribe prednisone  for 5 days. - Prescribe hydroxyzine  10 mg at night. - Advise to avoid touching face. - Instruct to return if no improvement after 5 days.

## 2024-05-14 ENCOUNTER — Other Ambulatory Visit: Payer: Self-pay | Admitting: Family Medicine

## 2024-05-14 DIAGNOSIS — M7918 Myalgia, other site: Secondary | ICD-10-CM

## 2024-05-14 MED ORDER — GABAPENTIN 600 MG PO TABS: 600.0000 mg | ORAL_TABLET | Freq: Every day | ORAL | 0 refills | Status: DC

## 2024-05-14 NOTE — Telephone Encounter (Signed)
 Copied from CRM [id number]. Topic: Clinical - Medication Refill >> May 14, 2024 10:46 AM Dimple Francis wrote: Medication: gabapentin  (NEURONTIN ) 600 MG tablet  Has the patient contacted their pharmacy? Yes (Agent: If no, request that the patient contact the pharmacy for the refill. If patient does not wish to contact the pharmacy document the reason why and proceed with request.) (Agent: If yes, when and what did the pharmacy advise?)  This is the patient's preferred pharmacy:  [id number] [place], [place] - [address] AT [facility] OF [address] [place] [address] Phone: [phone] Fax: [phone]   Is this the correct pharmacy for this prescription? Yes If no, delete pharmacy and type the correct one.   Has the prescription been filled recently? Yes  Is the patient out of the medication? Yes  Has the patient been seen for an appointment in the last year OR does the patient have an upcoming appointment? Yes  Can we respond through MyChart? Yes  Agent: Please be advised that Rx refills may take up to 3 business days. We ask that you follow-up with your pharmacy.

## 2024-05-18 ENCOUNTER — Ambulatory Visit: Payer: Self-pay

## 2024-05-18 NOTE — Telephone Encounter (Signed)
 Copied from CRM [id number]. Topic: Clinical - Red Word Triage >> May 18, 2024 11:25 AM Jenice Mitts wrote: Red Word that prompted transfer to Nurse Triage: swelling/bleeding  Decision tree prompted NT patient  is calling because she had a rash and was prescribed prednisone . She doesn't feel like the rash is going away and sometimes it itches so bad she stratches too much it becomes inflammed    Chief Complaint: Rash Symptoms: itching Frequency: a few weeks now Pertinent Negatives: Patient denies chest pain, difficulty breathing, fever, nausea, vomiting, diarrhea, Disposition: [] ED /[] Urgent Care (no appt availability in office) / [] Appointment(In office/virtual)/ []  [facility]/ [] Home Care/ [x] Refused Recommended Disposition /[] [facility]/ []  Follow-up with PCP Additional Notes: Patient called and advised that she has a rash and had 5 days of Prednisone . Patient was seen on 05/04/2024 and thinks she got into something while working in the yard causing her to go in on 05/04/2024.  Patient states that her rash didn't go away completely. She states the five tablets of Prednisone  helped but they werent enough to get this to go completely away.  Started on ankle of left foot then right foot and then back of right arm and near elbow joint.   Patient states that she didn't want to come back in for another visit if she didn't have to. She states that she wanted to see if she could medication called in such as a longer taper of Prednisone . She denies anything being worse or any additional symptoms and she tried using over the counter creams and medications but they aren't helping get rid of the itching completely. Patient is advised that if anything gets worse to go to the Emergency Room.  Patient verbalized understanding.       Reason for Disposition  Localized rash present > 7 days  Protocols used: Rash or Redness - Localized-A-AH

## 2024-05-18 NOTE — Telephone Encounter (Signed)
 Called pt and she stated that it started to go away but when she was done it came right. She stated that she had this rash before and they had to give her a taper of prednisone  to get rid of it.  She stated that she will make an appointment if that does not work.

## 2024-05-20 NOTE — Telephone Encounter (Signed)
 PT SCHEDULED TO SEE Deborra Falter 05/22/24

## 2024-05-22 ENCOUNTER — Telehealth: Payer: Self-pay

## 2024-05-22 ENCOUNTER — Encounter: Payer: Self-pay | Admitting: Nurse Practitioner

## 2024-05-22 ENCOUNTER — Ambulatory Visit (INDEPENDENT_AMBULATORY_CARE_PROVIDER_SITE_OTHER): Admitting: Nurse Practitioner

## 2024-05-22 VITALS — BP 122/78 | HR 65 | Temp 97.5°F | Ht 73.0 in | Wt 202.6 lb

## 2024-05-22 DIAGNOSIS — L259 Unspecified contact dermatitis, unspecified cause: Secondary | ICD-10-CM

## 2024-05-22 MED ORDER — CLOTRIMAZOLE-BETAMETHASONE 1-0.05 % EX CREA: 1.0000 | TOPICAL_CREAM | Freq: Every day | CUTANEOUS | 0 refills | Status: AC

## 2024-05-22 NOTE — Assessment & Plan Note (Signed)
 Pt reports recurrent contact dermatitis likely from poison ivy or oak exposure. Pt reports rash on left arm and leg with nocturnal pruritus. No rash visible on the exam.  - Prescribed Lortisone cream for rash and pruritus. - Advise sending rash picture via MyChart if it recurs. - Recommend wearing long sleeves and gloves in yard. - Suggest ice packs for pruritus relief. - Advise washing bed covers to eliminate irritants.

## 2024-05-22 NOTE — Patient Instructions (Signed)
 Today, you were seen for recurrent rashes and itching due to poison ivy and oak exposure, chronic back pain, and insomnia.  YOUR PLAN:  CONTACT DERMATITIS: You have recurrent rashes and itching likely from poison ivy or oak exposure, especially after yard work. -Apply Lortisone cream to the rash and itchy areas. -If the rash comes back, send a photo via MyChart for a possible prednisone  prescription. -Wear long sleeves and gloves when working in the yard to reduce exposure. -Use ice packs to help relieve itching. -Wash your bed covers to remove any irritants.

## 2024-05-22 NOTE — Progress Notes (Signed)
 Established Patient Office Visit  Subjective:  Patient ID: Maria Hamilton, female    DOB: [birth year]  Age: 73 y.o. MRN: [id number]  CC:  Chief Complaint  Patient presents with   Acute Visit    Skin concerns- poison ivy took prednisone  still itching   Discussed the use of a AI scribe software for clinical note transcription with the patient, who gave verbal consent to proceed.   HPI  Maria Hamilton is a 73 year old female who presents with recurrent rashes and itching after exposure to poison ivy and oak.  She experiences rashes and itching on her left arm and leg after yard work involving poison ivy and oak . The rash begins on her arm, spreads, and resolves temporarily before reappearing about 3 weeks ago was treated with Prednisone  on 05/05/23. The symptoms initially resolved but the rash returned post-treatment.  It is itchy, with symptoms worsening at night. She often works outside without full sleeves or gloves, increasing exposure risk.  HPI   Past Medical History:  Diagnosis Date   Acute anxiety 08/03/2015   Overview:  Last Assessment & Plan:  Klonopin  most helpful  Reports past taking it 1 tab am 1/2 tab midday and 1 tab pm  30 day supply faxed to pharmacy   Acute bronchitis 12/31/2016   Acute pancreatitis 10/14/2016   Altered mental status 10/12/2016   Anxiety    Aortic atherosclerosis (HCC)    Bronchitis    Chronic hip pain    Chronic pain syndrome    Depression    Edema    Fatigue    Fibromyalgia    GERD (gastroesophageal reflux disease)    Headache    Hypertension    Hypokalemia    Insomnia    Low back pain    Lumbar facet arthropathy    Polyneuropathy    Right upper quadrant abdominal pain    Vitamin D  deficiency     Past Surgical History:  Procedure Laterality Date   BACK SURGERY     x 5 (lumbar)   BREAST BIOPSY Left 90s   benign   CATARACT EXTRACTION, BILATERAL     CHOLECYSTECTOMY N/A 10/16/2016   Procedure: LAPAROSCOPIC  CHOLECYSTECTOMY WITH INTRAOPERATIVE CHOLANGIOGRAM;  Surgeon: Rhina Center III, MD;  Location: [facility] ORS;  Service: General;  Laterality: N/A;   COLONOSCOPY WITH PROPOFOL  N/A 12/22/2021   Procedure: COLONOSCOPY WITH PROPOFOL ;  Surgeon: Luke Salaam, MD;  Location: [facility] ENDOSCOPY;  Service: Gastroenterology;  Laterality: N/A;   cyst removal from wrist     ECTOPIC PREGNANCY SURGERY     LAPAROSCOPIC VAGINAL HYSTERECTOMY WITH SALPINGO OOPHORECTOMY     LUMBAR FUSION     L3-4;  L4-5   THORACIC LAMINECTOMY FOR SPINAL CORD STIMULATOR N/A 10/03/2023   Procedure: THORACIC LAMINECTOMY FOR SPINAL CORD STIMULATOR PLACEMENT (MEDTRONIC);  Surgeon: Carroll Clamp, MD;  Location: [facility] ORS;  Service: Neurosurgery;  Laterality: N/A;   TOTAL HIP ARTHROPLASTY Bilateral    TOTAL HIP REVISION Left     Family History  Problem Relation Age of Onset   Heart disease Mother    Cancer Father    Alcohol abuse Father     Social History   Socioeconomic History   Marital status: Married    Spouse name: Lovette Rud   Number of children: 2   Years of education: Not on file   Highest education level: Not on file  Occupational History   Not on file  Tobacco Use   Smoking status: Never  Smokeless tobacco: Never  Vaping Use   Vaping status: Never Used  Substance and Sexual Activity   Alcohol use: No    Alcohol/week: 0.0 standard drinks of alcohol   Drug use: No   Sexual activity: Yes  Other Topics Concern   Not on file  Social History Narrative   Married   Social Drivers of Health   Financial Resource Strain: Low Risk  (04/21/2024)   Overall Financial Resource Strain (CARDIA)    Difficulty of Paying Living Expenses: Not hard at all  Food Insecurity: No Food Insecurity (04/21/2024)   Hunger Vital Sign    Worried About Running Out of Food in the Last Year: Never true    Ran Out of Food in the Last Year: Never true  Transportation Needs: No Transportation Needs (04/21/2024)   PRAPARE - Scientist, research (physical sciences) (Medical): No    Lack of Transportation (Non-Medical): No  Physical Activity: Sufficiently Active (04/21/2024)   Exercise Vital Sign    Days of Exercise per Week: 6 days    Minutes of Exercise per Session: 60 min  Stress: No Stress Concern Present (04/21/2024)   [employer] of Occupational Health - Occupational Stress Questionnaire    Feeling of Stress : Only a little  Social Connections: Moderately Integrated (04/21/2024)   Social Connection and Isolation Panel [NHANES]    Frequency of Communication with Friends and Family: More than three times a week    Frequency of Social Gatherings with Friends and Family: More than three times a week    Attends Religious Services: More than 4 times per year    Active Member of [employer] or Organizations: No    Attends Banker Meetings: Never    Marital Status: Married  Catering manager Violence: Not At Risk (04/21/2024)   Humiliation, Afraid, Rape, and Kick questionnaire    Fear of Current or Ex-Partner: No    Emotionally Abused: No    Physically Abused: No    Sexually Abused: No     Outpatient Medications Prior to Visit  Medication Sig Dispense Refill   amLODipine  (NORVASC ) 10 MG tablet TAKE 1 TABLET EVERY DAY 90 tablet 3   Calcium  Carb-Cholecalciferol  (CALCIUM  600/VITAMIN D3) 600-20 MG-MCG TABS Take 1 tablet by mouth daily.     carvedilol  (COREG ) 12.5 MG tablet Take 1 tablet (12.5 mg total) by mouth 2 (two) times daily with a meal. 180 tablet 3   DULoxetine  (CYMBALTA ) 60 MG capsule Take 1 capsule (60 mg total) by mouth daily. 90 capsule 0   gabapentin  (NEURONTIN ) 600 MG tablet Take 1 tablet (600 mg total) by mouth at bedtime. 30 tablet 0   hydrOXYzine  (ATARAX ) 10 MG tablet Take 1 tablet (10 mg total) by mouth at bedtime as needed. 15 tablet 0   potassium chloride  SA (KLOR-CON  M) 20 MEQ tablet Take 1 tablet (20 mEq total) by mouth daily. 90 tablet 0   rosuvastatin  (CRESTOR ) 10 MG tablet Take 1 tablet (10 mg total) by  mouth daily. 90 tablet 3   spironolactone  (ALDACTONE ) 25 MG tablet Take 0.5 tablets (12.5 mg total) by mouth daily. 45 tablet 3   tiZANidine  (ZANAFLEX ) 2 MG tablet TAKE 2 TABLETS BY MOUTH AT BEDTIME AS NEEDED FOR MUSCLE SPASMS 30 tablet 0   [START ON 05/24/2024] tiZANidine  (ZANAFLEX ) 2 MG tablet Take 1 tablet (2 mg total) by mouth at bedtime as needed for muscle spasms. 30 tablet 2   No facility-administered medications prior to visit.  Allergies  Allergen Reactions   Ace Inhibitors Swelling    Angioedema   Ciprofloxacin  Itching and Rash   Penicillins Rash   Fentanyl  Itching   Hctz [Hydrochlorothiazide ]     hypoK    Latex Itching   Morphine  And Codeine  Itching   Isovue  M [Iopamidol ] Itching    Pt was given of Isovue  370 for CT Scan today @ 11:10am. After injection she started itching all over. We gave her 50mG  of Benadryl  IV.    Penicillin G Benzathine Rash   Tape Rash    ROS Review of Systems Negative unless indicated in HPI.    Objective:     Physical Exam Constitutional:      Appearance: Normal appearance.  Cardiovascular:     Rate and Rhythm: Normal rate and regular rhythm.     Pulses: Normal pulses.     Heart sounds: Normal heart sounds.  Pulmonary:     Effort: Pulmonary effort is normal.     Breath sounds: Normal breath sounds.  Skin:    General: Skin is warm.     Comments: No visible rash at present.   Neurological:     Mental Status: She is alert.     BP 122/78   Pulse 65   Temp (!) 97.5 F (36.4 C)   Ht 6\' 1"  (1.854 m)   Wt 202 lb 9.6 oz (91.9 kg)   SpO2 99%   BMI 26.73 kg/m  Wt Readings from Last 3 Encounters:  05/22/24 202 lb 9.6 oz (91.9 kg)  05/04/24 205 lb (93 kg)  04/24/24 205 lb 12.8 oz (93.4 kg)     Health Maintenance  Topic Date Due   Zoster Vaccines- Shingrix (1 of 2) Never done   Pneumonia Vaccine 35+ Years old (2 of 2 - PCV) 10/17/2017   DTaP/Tdap/Td (3 - Td or Tdap) 06/10/2022   COVID-19 Vaccine (1) 06/06/2024  (Originally 04/07/1956)   INFLUENZA VACCINE  07/17/2024   MAMMOGRAM  09/07/2024   Medicare Annual Wellness (AWV)  04/21/2025   Colonoscopy  12/23/2031   DEXA SCAN  Completed   Hepatitis C Screening  Completed   HPV VACCINES  Aged Out   Meningococcal B Vaccine  Aged Out    There are no preventive care reminders to display for this patient.  Lab Results  Component Value Date   TSH 1.59 10/10/2022   Lab Results  Component Value Date   WBC 4.4 10/10/2023   HGB 13.5 10/10/2023   HCT 41.8 10/10/2023   MCV 86.0 10/10/2023   PLT 343 10/10/2023   Lab Results  Component Value Date   NA 137 02/19/2024   K 3.4 (L) 02/19/2024   CO2 31 02/19/2024   GLUCOSE 102 (H) 02/19/2024   BUN 6 02/19/2024   CREATININE 0.85 02/19/2024   BILITOT 0.4 04/01/2024   ALKPHOS 108 04/01/2024   AST 15 04/01/2024   ALT 8 04/01/2024   PROT 7.5 04/01/2024   ALBUMIN 4.2 04/01/2024   CALCIUM  9.3 02/19/2024   ANIONGAP 11 10/10/2023   GFR 68.23 02/19/2024   Lab Results  Component Value Date   CHOL 142 04/01/2024   Lab Results  Component Value Date   HDL 60.80 04/01/2024   Lab Results  Component Value Date   LDLCALC 65 04/01/2024   Lab Results  Component Value Date   TRIG 82.0 04/01/2024   Lab Results  Component Value Date   CHOLHDL 2 04/01/2024   Lab Results  Component Value Date  HGBA1C 5.9 02/19/2024      Assessment & Plan:  Contact dermatitis, unspecified contact dermatitis type, unspecified trigger Assessment & Plan: Pt reports recurrent contact dermatitis likely from poison ivy or oak exposure. Pt reports rash on left arm and leg with nocturnal pruritus. No rash visible on the exam.  - Prescribed Lortisone cream for rash and pruritus. - Advise sending rash picture via MyChart if it recurs. - Recommend wearing long sleeves and gloves in yard. - Suggest ice packs for pruritus relief. - Advise washing bed covers to eliminate irritants.   Other orders -      Clotrimazole-Betamethasone; Apply 1 Application topically daily.  Dispense: 45 each; Refill: 0    Follow-up: Return if symptoms worsen or fail to improve.   Charmin Aguiniga, NP

## 2024-05-22 NOTE — Telephone Encounter (Signed)
 Patient came in office and stated that she was told she needs a PA for her Gabapentin .

## 2024-05-25 ENCOUNTER — Telehealth: Payer: Self-pay

## 2024-05-25 ENCOUNTER — Other Ambulatory Visit (HOSPITAL_COMMUNITY): Payer: Self-pay

## 2024-05-25 NOTE — Telephone Encounter (Signed)
 Patient would like to know if we could send Gabapentin  to [facility] when needs a refill and for a 90 day supply. Patient wants you to know that she has night hot flashes sometimes that is so bad she has to change her clothes.

## 2024-05-25 NOTE — Telephone Encounter (Signed)
 Hey I responded in clinical pool as well. No pa needed. Patient's copay is $1.88 for 30 day supply.

## 2024-05-25 NOTE — Telephone Encounter (Signed)
 Pharmacy Patient Advocate Encounter   Received notification from Physician's Office that prior authorization for Gabapentin  600MG  tablets is required/requested.   Insurance verification completed.   The patient is insured through [employer] .   Per test claim: The current 30 day co-pay is, $1.88.  No PA needed at this time. This test claim was processed through [facility]- copay amounts may vary at other pharmacies due to pharmacy/plan contracts, or as the patient moves through the different stages of their insurance plan.

## 2024-05-27 ENCOUNTER — Other Ambulatory Visit: Payer: Self-pay

## 2024-05-27 ENCOUNTER — Telehealth: Payer: Self-pay

## 2024-05-27 DIAGNOSIS — M7918 Myalgia, other site: Secondary | ICD-10-CM

## 2024-05-27 MED ORDER — GABAPENTIN 600 MG PO TABS: 600.0000 mg | ORAL_TABLET | Freq: Every day | ORAL | 0 refills | Status: DC

## 2024-05-27 NOTE — Telephone Encounter (Signed)
 E fax received asking for a refill on Rosuvastatin 

## 2024-05-28 NOTE — Telephone Encounter (Signed)
 Called and spoke with pt and informed her that it was sent in in march and she stated she will call the pharmacy pt also mentioned she has hot flashes and wanted to know what she can take for that I informed her that I could get her scheduled if she wanted to speak with a provider in regards to this. She stated she would call back after looking at her schedule to see when she get scheduled.

## 2024-06-05 ENCOUNTER — Telehealth: Payer: Self-pay | Admitting: Neurosurgery

## 2024-06-05 NOTE — Telephone Encounter (Signed)
 [facility] called to follow up on a prescription refill request for the patient.  methocarbamol  (ROBAXIN ) ; Medication was last prescribed by Dr.Naveira. Please advise

## 2024-06-05 NOTE — Telephone Encounter (Signed)
 I spoke to [facility] and informed them that we have not seen this patient in a year. We did not prescribe the medication. Dr. Naveira prescribed it last on 02/21/2023. They will contact the patient to get the correct information to refill the medication.

## 2024-06-08 ENCOUNTER — Other Ambulatory Visit: Payer: Self-pay

## 2024-06-08 DIAGNOSIS — M7918 Myalgia, other site: Secondary | ICD-10-CM

## 2024-06-08 MED ORDER — GABAPENTIN 600 MG PO TABS: 600.0000 mg | ORAL_TABLET | Freq: Every day | ORAL | 0 refills | Status: DC

## 2024-06-11 ENCOUNTER — Ambulatory Visit: Payer: Self-pay

## 2024-06-11 ENCOUNTER — Other Ambulatory Visit: Payer: Self-pay

## 2024-06-11 ENCOUNTER — Encounter: Payer: Self-pay | Admitting: *Deleted

## 2024-06-11 ENCOUNTER — Emergency Department
Admission: EM | Admit: 2024-06-11 | Discharge: 2024-06-11 | Discharge status: Against Medical Advice | Attending: Emergency Medicine | Admitting: Emergency Medicine

## 2024-06-11 DIAGNOSIS — M79605 Pain in left leg: Secondary | ICD-10-CM | POA: Insufficient documentation

## 2024-06-11 DIAGNOSIS — Z5321 Procedure and treatment not carried out due to patient leaving prior to being seen by health care provider: Secondary | ICD-10-CM | POA: Insufficient documentation

## 2024-06-11 DIAGNOSIS — M7989 Other specified soft tissue disorders: Secondary | ICD-10-CM | POA: Insufficient documentation

## 2024-06-11 LAB — CBC
HCT: 40.6 % (ref 36.0–46.0)
Hemoglobin: 13.1 g/dL (ref 12.0–15.0)
MCH: 28.1 pg (ref 26.0–34.0)
MCHC: 32.3 g/dL (ref 30.0–36.0)
MCV: 86.9 fL (ref 80.0–100.0)
Platelets: 283 10*3/uL (ref 150–400)
RBC: 4.67 MIL/uL (ref 3.87–5.11)
RDW: 13.8 % (ref 11.5–15.5)
WBC: 3.6 10*3/uL — ABNORMAL LOW (ref 4.0–10.5)
nRBC: 0 % (ref 0.0–0.2)

## 2024-06-11 LAB — BASIC METABOLIC PANEL WITH GFR
Anion gap: 11 (ref 5–15)
BUN: 5 mg/dL — ABNORMAL LOW (ref 8–23)
CO2: 25 mmol/L (ref 22–32)
Calcium: 9.5 mg/dL (ref 8.9–10.3)
Chloride: 104 mmol/L (ref 98–111)
Creatinine, Ser: 0.64 mg/dL (ref 0.44–1.00)
GFR, Estimated: >60 mL/min (ref >60)
Glucose, Bld: 109 mg/dL — ABNORMAL HIGH (ref 70–99)
Potassium: 4.6 mmol/L (ref 3.5–5.1)
Sodium: 140 mmol/L (ref 135–145)

## 2024-06-11 NOTE — ED Triage Notes (Signed)
 Pt from [facility] with left leg swelling, redness and pain. Sent for DVT study.

## 2024-06-11 NOTE — ED Triage Notes (Signed)
 Pt ambulatory to triage.  Pt has left leg pain and swelling.  No known injury.  Recent poison oak rash on legs.  Pt alert  speech clear.  Pt seen at [facility]  pt sent for eval

## 2024-06-11 NOTE — Telephone Encounter (Signed)
 Spoke to pt  relayed message below per Rollene, she stated that she will go to [facility] [facility] she just has to find a ride

## 2024-06-11 NOTE — Telephone Encounter (Signed)
 Copied from CRM [id number]. Topic: Clinical - Red Word Triage >> Jun 11, 2024 10:56 AM Rea BROCKS wrote: Red Word that prompted transfer to Nurse Triage: Rash/Swollen foot/ankle. Rash is on left leg by the ankle. Left foot swells up-starts at ankle and goes to toes. Foot is so numb that she doesn't feel it anymore. Can't walk through the house. Any pressure on it makes it worse.

## 2024-06-11 NOTE — Telephone Encounter (Signed)
 FYI Only or Action Required?: tiZANidine  (ZANAFLEX ) 2 MG tablet- made as a 30 day tablet for once at night- Patient needs the prescription to be changed back to two tablets at night, instead of one tablet because she cannot endure the pain at night and would prefer to have the script back to 2 at night. Patient is needing to call back in to pharmacy for a refill  every 15 days because the presciption runs out. . Patient was last seen in primary care on 05/22/2024 by Vincente Saber, NP. Called Nurse Triage reporting Joint Swelling. Symptoms began several months ago. Interventions attempted: Nothing. Symptoms are: unchanged.  Triage Disposition: See PCP within 7 days  Patient/caregiver understands and will follow disposition?:      Copied from CRM [id number]. Topic: Clinical - Prescription Issue >> Jun 11, 2024 10:54 AM Rea BROCKS wrote: Reason for CRM: tiZANidine  (ZANAFLEX ) 2 MG tablet- made as a 30 day tablet for once at night- Patient needs the prescription to be changed back to two tablets at night, instead of one tablet because she cannot endure the pain at night and would prefer to have the script back to 2 at night. Patient is needing to call back in to pharmacy for a refill  every 15 days because the presciption runs out.   Clotrimazole  cream: Patient is requesting if something could be given by mouth instead of the cream for the rash because of how it is spreading.   Gabepentin- having to be charged for it now and never had to be paid for it. Reason for Disposition  SEVERE swelling (e.g., can't move swollen ankle at all)  Answer Assessment - Initial Assessment Questions 1. LOCATION: Which ankle is swollen? Where is the swelling?     Left ankle 2. ONSET: When did the swelling start?     It's been awhile 3. SWELLING: How bad is the swelling? Or, How large is it? (e.g., mild, moderate, severe; size of localized swelling)    - NONE: No joint swelling.   - LOCALIZED: Localized;  small area of puffy or swollen skin (e.g., insect bite, skin irritation).   - MILD: Joint looks or feels mildly swollen or puffy.   - MODERATE: Swollen; interferes with normal activities (e.g., work or school); decreased range of movement; may be limping.   - SEVERE: Very swollen; can't move swollen joint at all; limping a lot or unable to walk.    severe 4. PAIN: Is there any pain? If Yes, ask: How bad is it? (Scale 1-10; or mild, moderate, severe)   - NONE (0): no pain.   - MILD (1-3): doesn't interfere with normal activities.    - MODERATE (4-7): interferes with normal activities (e.g., work or school) or awakens from sleep, limping.    - SEVERE (8-10): excruciating pain, unable to do any normal activities, unable to walk.      mild 5. CAUSE: What do you think caused the ankle swelling?     Don't know 6. OTHER SYMPTOMS: Do you have any other symptoms? (e.g., fever, chest pain, difficulty breathing, calf pain)     no 7. PREGNANCY: Is there any chance you are pregnant? When was your last menstrual period?     na  Protocols used: Ankle Swelling-A-AH

## 2024-06-11 NOTE — Telephone Encounter (Signed)
 SENDING TO DOC OF DAY. PT HAS APPT SCHEDULED FOR THURSDAY

## 2024-06-15 NOTE — Telephone Encounter (Signed)
 FYI pt was seen in the ED on 06/11/2024

## 2024-06-15 NOTE — Telephone Encounter (Signed)
 Left message to return call to our office.

## 2024-06-15 NOTE — Telephone Encounter (Signed)
 Pt went to ed 06/11/24 and LWBS  Advise not to wait until 06/18/24 appt ; this is very unsafe  Advise [facility]

## 2024-06-16 ENCOUNTER — Other Ambulatory Visit: Payer: Self-pay | Admitting: Internal Medicine

## 2024-06-16 DIAGNOSIS — M7918 Myalgia, other site: Secondary | ICD-10-CM

## 2024-06-16 NOTE — Telephone Encounter (Signed)
 If still having pain, swelling, etc - still needs to be seen. Needs evaluation and then can determine what treatment is needed.

## 2024-06-16 NOTE — Telephone Encounter (Signed)
 Agree with evaluation - to be able to confirm best treatment needed.

## 2024-06-16 NOTE — Telephone Encounter (Signed)
 Pt stated that she is not having any swelling, or any pain. She stated that her ankle is back to normal size and it came from taking another medication ask what medication it was she was unable to tell me. She stated that she has been out of the Zanaflex  for about 2 weeks.

## 2024-06-16 NOTE — Telephone Encounter (Signed)
 Spoke to pt and she stated that she left the ER after being there 5 hours. She stated that she went home and doctored on herself. She stated that she need a refill of Zanaflex  the 2 tablets by mouth at bedtime. It looks like Dr. Hope sent in 2 different rx one that says 1 tablet and the other that said 2 tablets,and she is completely out.  Rx request needs to go to [facility].

## 2024-06-17 NOTE — Telephone Encounter (Signed)
 Refilled 1 week ago. Duplicate request. Please refuse this request.

## 2024-06-17 NOTE — Progress Notes (Signed)
 Established Patient Office Visit  Subjective:  Patient ID: Maria Hamilton, female    DOB: July 11, 1951  Age: 73 y.o. MRN: [id number]  CC:  Chief Complaint  Patient presents with   Acute Visit    Ankle swelling   Discussed the use of a AI scribe software for clinical note transcription with the patient, who gave verbal consent to proceed.  HPI Maria Hamilton is a 73 year old female who presents with medication management issues and swelling of ankle.  Patient was seen in the urgent care on 626 for leg pain and swelling.  She was referred to ED to rule out DVT but left the ED without being seen due to long wait time.  Patient states she believes her leg swelling was due to taking methocarbamol , which she used because she ran out of gabapentin  and tizanidine .  She reports that the swelling resolved after she stopped taking methocarbamol .  She faced insurance and prescription refill issues, causing her to run out of gabapentin  and tizanidine . A 90-day supply of gabapentin  has been received, and she is awaiting a refill of tizanidine . She is frustrated with the refill process, which exacerbates her pain and disrupts her sleep.  No swelling or pain at present.  HPI   Past Medical History:  Diagnosis Date   Acute anxiety 08/03/2015   Overview:  Last Assessment & Plan:  Klonopin  most helpful  Reports past taking it 1 tab am 1/2 tab midday and 1 tab pm  30 day supply faxed to pharmacy   Acute bronchitis 12/31/2016   Acute pancreatitis 10/14/2016   Altered mental status 10/12/2016   Anxiety    Aortic atherosclerosis (HCC)    Bronchitis    Chronic hip pain    Chronic pain syndrome    Depression    Edema    Fatigue    Fibromyalgia    GERD (gastroesophageal reflux disease)    Headache    Hypertension    Hypokalemia    Insomnia    Low back pain    Lumbar facet arthropathy    Polyneuropathy    Right upper quadrant abdominal pain    Vitamin D  deficiency     Past Surgical  History:  Procedure Laterality Date   BACK SURGERY     x 5 (lumbar)   BREAST BIOPSY Left 90s   benign   CATARACT EXTRACTION, BILATERAL     CHOLECYSTECTOMY N/A 10/16/2016   Procedure: LAPAROSCOPIC CHOLECYSTECTOMY WITH INTRAOPERATIVE CHOLANGIOGRAM;  Surgeon: Elgin Laurence III, MD;  Location: [facility] ORS;  Service: General;  Laterality: N/A;   COLONOSCOPY WITH PROPOFOL  N/A 12/22/2021   Procedure: COLONOSCOPY WITH PROPOFOL ;  Surgeon: Therisa Bi, MD;  Location: [facility] ENDOSCOPY;  Service: Gastroenterology;  Laterality: N/A;   cyst removal from wrist     ECTOPIC PREGNANCY SURGERY     LAPAROSCOPIC VAGINAL HYSTERECTOMY WITH SALPINGO OOPHORECTOMY     LUMBAR FUSION     L3-4;  L4-5   THORACIC LAMINECTOMY FOR SPINAL CORD STIMULATOR N/A 10/03/2023   Procedure: THORACIC LAMINECTOMY FOR SPINAL CORD STIMULATOR PLACEMENT (MEDTRONIC);  Surgeon: Claudene Penne ORN, MD;  Location: [facility] ORS;  Service: Neurosurgery;  Laterality: N/A;   TOTAL HIP ARTHROPLASTY Bilateral    TOTAL HIP REVISION Left     Family History  Problem Relation Age of Onset   Heart disease Mother    Cancer Father    Alcohol abuse Father     Social History   Socioeconomic History   Marital status: Married  Spouse name: Sandra   Number of children: 2   Years of education: Not on file   Highest education level: Not on file  Occupational History   Not on file  Tobacco Use   Smoking status: Never   Smokeless tobacco: Never  Vaping Use   Vaping status: Never Used  Substance and Sexual Activity   Alcohol use: No    Alcohol/week: 0.0 standard drinks of alcohol   Drug use: No   Sexual activity: Yes  Other Topics Concern   Not on file  Social History Narrative   Married   Social Drivers of Health   Financial Resource Strain: Low Risk  (06/11/2024)   Received from [facility]   Overall Financial Resource Strain (CARDIA)    Difficulty of Paying Living Expenses: Not very hard  Food Insecurity: No Food Insecurity  (06/11/2024)   Received from [facility]   Hunger Vital Sign    Within the past 12 months, you worried that your food would run out before you got the money to buy more.: Never true    Within the past 12 months, the food you bought just didn't last and you didn't have money to get more.: Never true  Transportation Needs: No Transportation Needs (06/11/2024)   Received from [facility] - Transportation    In the past 12 months, has lack of transportation kept you from medical appointments or from getting medications?: No    Lack of Transportation (Non-Medical): No  Physical Activity: Sufficiently Active (04/21/2024)   Exercise Vital Sign    Days of Exercise per Week: 6 days    Minutes of Exercise per Session: 60 min  Stress: No Stress Concern Present (04/21/2024)   [employer] of Occupational Health - Occupational Stress Questionnaire    Feeling of Stress : Only a little  Social Connections: Moderately Integrated (04/21/2024)   Social Connection and Isolation Panel    Frequency of Communication with Friends and Family: More than three times a week    Frequency of Social Gatherings with Friends and Family: More than three times a week    Attends Religious Services: More than 4 times per year    Active Member of [employer] or Organizations: No    Attends Banker Meetings: Never    Marital Status: Married  Catering manager Violence: Not At Risk (04/21/2024)   Humiliation, Afraid, Rape, and Kick questionnaire    Fear of Current or Ex-Partner: No    Emotionally Abused: No    Physically Abused: No    Sexually Abused: No     Outpatient Medications Prior to Visit  Medication Sig Dispense Refill   amLODipine  (NORVASC ) 10 MG tablet TAKE 1 TABLET EVERY DAY 90 tablet 3   Calcium  Carb-Cholecalciferol  (CALCIUM  600/VITAMIN D3) 600-20 MG-MCG TABS Take 1 tablet by mouth daily.     carvedilol  (COREG ) 12.5 MG tablet Take 1 tablet (12.5 mg total) by  mouth 2 (two) times daily with a meal. 180 tablet 3   clotrimazole -betamethasone  (LOTRISONE ) cream Apply 1 Application topically daily. 45 each 0   DULoxetine  (CYMBALTA ) 60 MG capsule Take 1 capsule (60 mg total) by mouth daily. 90 capsule 0   gabapentin  (NEURONTIN ) 600 MG tablet Take 1 tablet (600 mg total) by mouth at bedtime. 90 tablet 0   hydrOXYzine  (ATARAX ) 10 MG tablet Take 1 tablet (10 mg total) by mouth at bedtime as needed. 15 tablet 0   potassium chloride  SA (  KLOR-CON  M) 20 MEQ tablet Take 1 tablet (20 mEq total) by mouth daily. 90 tablet 0   rosuvastatin  (CRESTOR ) 10 MG tablet Take 1 tablet (10 mg total) by mouth daily. 90 tablet 3   spironolactone  (ALDACTONE ) 25 MG tablet Take 0.5 tablets (12.5 mg total) by mouth daily. 45 tablet 3   tiZANidine  (ZANAFLEX ) 2 MG tablet TAKE 2 TABLETS BY MOUTH AT BEDTIME AS NEEDED FOR MUSCLE SPASMS 30 tablet 0   tiZANidine  (ZANAFLEX ) 2 MG tablet Take 1 tablet (2 mg total) by mouth at bedtime as needed for muscle spasms. 30 tablet 2   No facility-administered medications prior to visit.    Allergies  Allergen Reactions   Ace Inhibitors Swelling    Angioedema   Ciprofloxacin  Itching and Rash   Penicillins Rash   Fentanyl  Itching   Hctz [Hydrochlorothiazide ]     hypoK    Latex Itching   Morphine  And Codeine  Itching   Isovue  M [Iopamidol ] Itching    Pt was given of Isovue  370 for CT Scan today @ 11:10am. After injection she started itching all over. We gave her 50mG  of Benadryl  IV.    Penicillin G Benzathine Rash   Tape Rash    ROS Review of Systems Negative unless indicated in HPI.    Objective:    Physical Exam Constitutional:      Appearance: Normal appearance.  Cardiovascular:     Rate and Rhythm: Normal rate and regular rhythm.     Pulses: Normal pulses.     Heart sounds: Normal heart sounds.  Pulmonary:     Effort: Pulmonary effort is normal.     Breath sounds: No stridor. No wheezing.  Abdominal:     Tenderness:  There is left CVA tenderness.  Musculoskeletal:     Cervical back: Normal range of motion.     Right lower leg: No edema.     Left lower leg: No edema.  Neurological:     General: No focal deficit present.     Mental Status: She is alert. Mental status is at baseline.  Psychiatric:        Mood and Affect: Mood normal.        Behavior: Behavior normal.        Thought Content: Thought content normal.        Judgment: Judgment normal.     BP 126/82   Pulse 64   Temp 97.6 F (36.4 C)   Ht 6' 1 (1.854 m)   Wt 207 lb 12.8 oz (94.3 kg)   SpO2 97%   BMI 27.42 kg/m  Wt Readings from Last 3 Encounters:  06/18/24 207 lb 12.8 oz (94.3 kg)  06/11/24 206 lb (93.4 kg)  05/22/24 202 lb 9.6 oz (91.9 kg)     Health Maintenance  Topic Date Due   COVID-19 Vaccine (1) Never done   Zoster Vaccines- Shingrix (1 of 2) Never done   Pneumococcal Vaccine: 50+ Years (2 of 2 - PCV) 10/17/2017   DTaP/Tdap/Td (3 - Td or Tdap) 06/10/2022   INFLUENZA VACCINE  07/17/2024   MAMMOGRAM  09/07/2024   Medicare Annual Wellness (AWV)  04/21/2025   Colonoscopy  12/23/2031   DEXA SCAN  Completed   Hepatitis C Screening  Completed   Hepatitis B Vaccines  Aged Out   HPV VACCINES  Aged Out   Meningococcal B Vaccine  Aged Out    There are no preventive care reminders to display for this patient.  Lab Results  Component  Value Date   TSH 1.59 10/10/2022   Lab Results  Component Value Date   WBC 3.6 (L) 06/11/2024   HGB 13.1 06/11/2024   HCT 40.6 06/11/2024   MCV 86.9 06/11/2024   PLT 283 06/11/2024   Lab Results  Component Value Date   NA 140 06/11/2024   K 4.6 06/11/2024   CO2 25 06/11/2024   GLUCOSE 109 (H) 06/11/2024   BUN 5 (L) 06/11/2024   CREATININE 0.64 06/11/2024   BILITOT 0.4 04/01/2024   ALKPHOS 108 04/01/2024   AST 15 04/01/2024   ALT 8 04/01/2024   PROT 7.5 04/01/2024   ALBUMIN 4.2 04/01/2024   CALCIUM  9.5 06/11/2024   ANIONGAP 11 06/11/2024   GFR 68.23 02/19/2024   Lab  Results  Component Value Date   CHOL 142 04/01/2024   Lab Results  Component Value Date   HDL 60.80 04/01/2024   Lab Results  Component Value Date   LDLCALC 65 04/01/2024   Lab Results  Component Value Date   TRIG 82.0 04/01/2024   Lab Results  Component Value Date   CHOLHDL 2 04/01/2024   Lab Results  Component Value Date   HGBA1C 5.9 02/19/2024      Assessment & Plan:  Left leg swelling Assessment & Plan: Left leg swelling resolved since discontinuing methocarbamol .  Denies any pain or swelling at present. -Continue gabapentin  and tizanidine  as needed. - Monitor for any recurrent swelling or pain. - Return to the clinic if symptoms recur.   Chronic low back pain (2ry area of Pain) (Left) w/ sciatica (Left) Assessment & Plan: Chronic pain managed with gabapentin  and tizanidine . Methocarbamol  caused swelling, now resolved. Ensured continuous medication supply to prevent future gaps. - Send a 30-day supply of tizanidine  to [facility]. - Send a 90-day supply of tizanidine  to the mail-order pharmacy. - Ensure a 90-day supply of gabapentin .  Orders: -     tiZANidine  HCl; Take 2 tablets (4 mg total) by mouth at bedtime as needed for muscle spasms.  Dispense: 30 tablet; Refill: 0 -     tiZANidine  HCl; TAKE 2 TABLETS BY MOUTH AT BEDTIME AS NEEDED FOR MUSCLE SPASMS  Dispense: 60 tablet; Refill: 1    Follow-up: No follow-ups on file.   Machi Whittaker, NP

## 2024-06-18 ENCOUNTER — Ambulatory Visit (INDEPENDENT_AMBULATORY_CARE_PROVIDER_SITE_OTHER): Admitting: Nurse Practitioner

## 2024-06-18 ENCOUNTER — Encounter: Payer: Self-pay | Admitting: Nurse Practitioner

## 2024-06-18 VITALS — BP 126/82 | HR 64 | Temp 97.6°F | Ht 73.0 in | Wt 207.8 lb

## 2024-06-18 DIAGNOSIS — M7989 Other specified soft tissue disorders: Secondary | ICD-10-CM

## 2024-06-18 DIAGNOSIS — M5442 Lumbago with sciatica, left side: Secondary | ICD-10-CM | POA: Diagnosis not present

## 2024-06-18 DIAGNOSIS — G8929 Other chronic pain: Secondary | ICD-10-CM

## 2024-06-18 DIAGNOSIS — M7918 Myalgia, other site: Secondary | ICD-10-CM

## 2024-06-18 MED ORDER — TIZANIDINE HCL 2 MG PO TABS: 4.0000 mg | ORAL_TABLET | Freq: Every evening | ORAL | 0 refills | Status: DC | PRN

## 2024-06-18 MED ORDER — TIZANIDINE HCL 2 MG PO TABS: ORAL_TABLET | ORAL | 1 refills | Status: DC

## 2024-06-18 NOTE — Patient Instructions (Signed)
 You visited us  today to address medication management issues and swelling. Your blood pressure is now well-controlled, and the swelling in your leg has resolved after stopping methocarbamol . We have ensured that you have a continuous supply of your medications to prevent future issues.  YOUR PLAN:  CHRONIC PAIN MANAGEMENT: Your chronic pain is managed with gabapentin  and tizanidine . The swelling you experienced was due to methocarbamol , which you have now stopped taking. -We have sent a 15 -day supply of tizanidine  to [facility]. -We have sent a 90-day supply of tizanidine  to the mail-order pharmacy. -We have ensured a 90-day supply of gabapentin .

## 2024-07-05 NOTE — Assessment & Plan Note (Signed)
 Chronic pain managed with gabapentin  and tizanidine . Methocarbamol  caused swelling, now resolved. Ensured continuous medication supply to prevent future gaps. - Send a 30-day supply of tizanidine  to [facility]. - Send a 90-day supply of tizanidine  to the mail-order pharmacy. - Ensure a 90-day supply of gabapentin .

## 2024-07-05 NOTE — Assessment & Plan Note (Signed)
 Left leg swelling resolved since discontinuing methocarbamol .  Denies any pain or swelling at present. -Continue gabapentin  and tizanidine  as needed. - Monitor for any recurrent swelling or pain. - Return to the clinic if symptoms recur.

## 2024-07-22 ENCOUNTER — Other Ambulatory Visit: Payer: Self-pay | Admitting: Internal Medicine

## 2024-07-22 DIAGNOSIS — M7918 Myalgia, other site: Secondary | ICD-10-CM

## 2024-08-10 ENCOUNTER — Other Ambulatory Visit: Payer: Self-pay | Admitting: Nurse Practitioner

## 2024-08-10 DIAGNOSIS — G8929 Other chronic pain: Secondary | ICD-10-CM

## 2024-08-11 ENCOUNTER — Encounter: Payer: Self-pay | Admitting: Nurse Practitioner

## 2024-08-11 ENCOUNTER — Ambulatory Visit: Payer: Medicare HMO | Admitting: Nurse Practitioner

## 2024-08-11 VITALS — BP 126/70 | HR 62 | Temp 98.0°F | Ht 73.0 in | Wt 210.4 lb

## 2024-08-11 DIAGNOSIS — E785 Hyperlipidemia, unspecified: Secondary | ICD-10-CM

## 2024-08-11 DIAGNOSIS — I10 Essential (primary) hypertension: Secondary | ICD-10-CM | POA: Diagnosis not present

## 2024-08-11 DIAGNOSIS — M7918 Myalgia, other site: Secondary | ICD-10-CM

## 2024-08-11 DIAGNOSIS — M25472 Effusion, left ankle: Secondary | ICD-10-CM | POA: Diagnosis not present

## 2024-08-11 DIAGNOSIS — G8929 Other chronic pain: Secondary | ICD-10-CM

## 2024-08-11 DIAGNOSIS — J309 Allergic rhinitis, unspecified: Secondary | ICD-10-CM | POA: Diagnosis not present

## 2024-08-11 DIAGNOSIS — M5442 Lumbago with sciatica, left side: Secondary | ICD-10-CM | POA: Diagnosis not present

## 2024-08-11 DIAGNOSIS — Z9889 Other specified postprocedural states: Secondary | ICD-10-CM | POA: Diagnosis not present

## 2024-08-11 DIAGNOSIS — M25572 Pain in left ankle and joints of left foot: Secondary | ICD-10-CM

## 2024-08-11 DIAGNOSIS — G894 Chronic pain syndrome: Secondary | ICD-10-CM

## 2024-08-11 MED ORDER — GABAPENTIN 600 MG PO TABS
600.0000 mg | ORAL_TABLET | Freq: Every day | ORAL | 1 refills | Status: AC
Start: 2024-08-11 — End: ?

## 2024-08-11 MED ORDER — TIZANIDINE HCL 2 MG PO TABS
ORAL_TABLET | ORAL | 1 refills | Status: AC
Start: 2024-08-11 — End: ?

## 2024-08-11 MED ORDER — METHYLPREDNISOLONE 4 MG PO TBPK: ORAL_TABLET | ORAL | 0 refills | Status: AC

## 2024-08-11 NOTE — Progress Notes (Signed)
 Leron Glance, NP-C Phone: [phone]  Maria Hamilton is a 73 y.o. female who presents today for transfer of care.   Discussed the use of AI scribe software for clinical note transcription with the patient, who gave verbal consent to proceed.  History of Present Illness   Maria Hamilton is a 73 year old female with chronic back pain and neuropathy who presents with leg pain and stiffness.  She experiences left ankle pain and stiffness, mostly along her Achilles, that results in a limp. The pain is described as throbbing, and she finds some relief with heat application, although Tylenol  and ibuprofen  are ineffective. Elevating her legs also helps alleviate symptoms. She has a history of cutting her left Achilles tendon in 2016, which required surgical repair and a week-long hospital stay. She remains active, engaging in activities such as repairing, mowing, and painting.  She has a spinal stimulator in place but has not been using it regularly due to bothersome beeping sounds. She wonders if her current symptoms might be related to the stimulator.  Her medication regimen includes gabapentin , Crestor , Norvasc , Coreg , and spironolactone . She also takes Xanax to aid sleep. She monitors her blood pressure at home, with recent readings around 126/70.  No chest pain, shortness of breath, or dizziness. She reports a scratchy throat on one side, possibly related to weather changes.      Social History   Tobacco Use  Smoking Status Never  Smokeless Tobacco Never    Current Outpatient Medications on File Prior to Visit  Medication Sig Dispense Refill   amLODipine  (NORVASC ) 10 MG tablet TAKE 1 TABLET EVERY DAY 90 tablet 3   Calcium  Carb-Cholecalciferol  (CALCIUM  600/VITAMIN D3) 600-20 MG-MCG TABS Take 1 tablet by mouth daily.     carvedilol  (COREG ) 12.5 MG tablet Take 1 tablet (12.5 mg total) by mouth 2 (two) times daily with a meal. 180 tablet 3   clotrimazole -betamethasone  (LOTRISONE )  cream Apply 1 Application topically daily. 45 each 0   DULoxetine  (CYMBALTA ) 60 MG capsule Take 1 capsule (60 mg total) by mouth daily. 90 capsule 0   hydrOXYzine  (ATARAX ) 10 MG tablet Take 1 tablet (10 mg total) by mouth at bedtime as needed. 15 tablet 0   rosuvastatin  (CRESTOR ) 10 MG tablet Take 1 tablet (10 mg total) by mouth daily. 90 tablet 3   spironolactone  (ALDACTONE ) 25 MG tablet Take 0.5 tablets (12.5 mg total) by mouth daily. 45 tablet 3   No current facility-administered medications on file prior to visit.     ROS see history of present illness  Objective  Physical Exam Vitals:   08/11/24 1101  BP: 126/70  Pulse: 62  Temp: 98 F (36.7 C)  SpO2: 99%    BP Readings from Last 3 Encounters:  08/11/24 126/70  06/18/24 126/82  06/11/24 (!) 187/101   Wt Readings from Last 3 Encounters:  08/11/24 210 lb 6.4 oz (95.4 kg)  06/18/24 207 lb 12.8 oz (94.3 kg)  06/11/24 206 lb (93.4 kg)    Physical Exam Constitutional:      General: She is not in acute distress.    Appearance: Normal appearance.  HENT:     Head: Normocephalic.     Right Ear: Tympanic membrane normal.     Left Ear: Tympanic membrane normal.     Nose: Nose normal.     Mouth/Throat:     Mouth: Mucous membranes are moist.     Pharynx: Oropharynx is clear. No posterior oropharyngeal erythema.  Cardiovascular:  Rate and Rhythm: Normal rate and regular rhythm.     Heart sounds: Normal heart sounds.  Pulmonary:     Effort: Pulmonary effort is normal.     Breath sounds: Normal breath sounds.  Musculoskeletal:     Left ankle: Swelling present. Decreased range of motion.     Left Achilles Tendon: Tenderness present.  Skin:    General: Skin is warm and dry.  Neurological:     General: No focal deficit present.     Mental Status: She is alert.  Psychiatric:        Mood and Affect: Mood normal.        Behavior: Behavior normal.      Assessment/Plan: Please see individual problem list.  Primary  hypertension Assessment & Plan: Hypertension is well-controlled with current medications. Home and office blood pressure readings average 120/70 mmHg. Continue Coreg , Spironolactone  and Norvasc  as prescribed.    Hx of Achilles tendon repair Assessment & Plan: Left Achilles pain and swelling may be related to a past Achilles tendon injury, with tendinitis or other inflammatory processes considered. Trial MDP to aid in pain relief and refer to an orthopedic specialist for further evaluation. Encourage alternating ice and heat as needed.   Orders: -     Ambulatory referral to Orthopedics  Pain and swelling of left ankle Assessment & Plan: Hx of left achilles tendon repair. Limping. Pain and swelling noted along achilles. Prescribe MDP and refer to ortho for further evaluation.   Orders: -     methylPREDNISolone ; Take as directed.  Dispense: 21 each; Refill: 0 -     Ambulatory referral to Orthopedics  Allergic rhinitis, unspecified seasonality, unspecified trigger Assessment & Plan: Suspected seasonal allergic rhinitis presents with a scratchy throat, likely due to weather changes. Recommend antihistamines such as Zyrtec or Claritin and suggest nasal spray like Flonase for symptom relief. Return precautions given to patient.    Hyperlipidemia, unspecified hyperlipidemia type Assessment & Plan: Managed with Crestor  20 mg daily. Continue.    Chronic pain syndrome Assessment & Plan: Pain is adequately controlled with Gabapentin  and Tizanidine . Continue current management. Refills provided.   Orders: -     Gabapentin ; Take 1 tablet (600 mg total) by mouth at bedtime.  Dispense: 90 tablet; Refill: 1 -     tiZANidine  HCl; TAKE 2 TABLETS BY MOUTH AT BEDTIME AS NEEDED FOR MUSCLE SPASMS  Dispense: 180 tablet; Refill: 1  Chronic low back pain (2ry area of Pain) (Left) w/ sciatica (Left) -     tiZANidine  HCl; TAKE 2 TABLETS BY MOUTH AT BEDTIME AS NEEDED FOR MUSCLE SPASMS  Dispense: 180  tablet; Refill: 1     Return in about 6 months (around 02/11/2025) for Follow up.   Leron Glance, NP-C [facility]

## 2024-08-14 ENCOUNTER — Ambulatory Visit: Payer: Self-pay

## 2024-08-14 ENCOUNTER — Other Ambulatory Visit: Payer: Self-pay | Admitting: Nurse Practitioner

## 2024-08-14 DIAGNOSIS — R051 Acute cough: Secondary | ICD-10-CM

## 2024-08-14 MED ORDER — PROMETHAZINE-DM 6.25-15 MG/5ML PO SYRP: 5.0000 mL | ORAL_SOLUTION | Freq: Four times a day (QID) | ORAL | 0 refills | Status: AC | PRN

## 2024-08-14 NOTE — Telephone Encounter (Signed)
 This RN attempted to contact patient. 1st attempt. LVM.

## 2024-08-14 NOTE — Telephone Encounter (Signed)
 This RN made second attempt to contact pt with no answer. A vm was left with call back number provided.

## 2024-08-14 NOTE — Telephone Encounter (Signed)
 Copied from CRM [id number]. Topic: Clinical - Prescription Issue >> Aug 14, 2024  8:03 AM Macario HERO wrote: Reason for CRM: Patient said that she's been experiencing  a cough and is unable to come into the office because her husband is going to the hospital. She is requesting a prescription sent to her pharmacy.

## 2024-08-14 NOTE — Telephone Encounter (Signed)
 Pt has been informed.

## 2024-08-14 NOTE — Telephone Encounter (Signed)
 FYI Only or Action Required?: Action required by provider: update on patient condition.  Patient was last seen in primary care on 08/11/2024 by Gretel App, NP.  Called Nurse Triage reporting Cough.  Symptoms began several days ago.   Triage Disposition: See Physician Within 24 Hours  Patient/caregiver understands and will follow disposition?: No, wishes to speak with PCP           Reason for Disposition  SEVERE coughing spells (e.g., whooping sound after coughing, vomiting after coughing)  Answer Assessment - Initial Assessment Questions Pt states she was seen in office on Mon for a scratchy throat. Pt is unable to come into the office for an appt and refuses a virtual appt. Pt is requesting robitussin for her cough sent to the [facility].  ONSET: When did the cough begin?      Tuesday  SEVERITY: How bad is the cough today?      A hacking cough  HEMOPTYSIS: Are you coughing up any blood? If Yes, ask: How much? (e.g., flecks, streaks, tablespoons, etc.)     No  DIFFICULTY BREATHING: Are you having difficulty breathing? If Yes, ask: How bad is it? (e.g., mild, moderate, severe)      No  FEVER: Do you have a fever? If Yes, ask: What is your temperature, how was it measured, and when did it start?     No  OTHER SYMPTOMS: Do you have any other symptoms? (e.g., runny nose, wheezing, chest pain)       No  Protocols used: Cough - Acute Non-Productive-A-AH

## 2024-08-21 ENCOUNTER — Other Ambulatory Visit: Payer: Self-pay

## 2024-08-21 DIAGNOSIS — I10 Essential (primary) hypertension: Secondary | ICD-10-CM

## 2024-08-21 MED ORDER — POTASSIUM CHLORIDE CRYS ER 20 MEQ PO TBCR
20.0000 meq | EXTENDED_RELEASE_TABLET | Freq: Every day | ORAL | 0 refills | Status: AC
Start: 2024-08-21 — End: ?

## 2024-08-27 ENCOUNTER — Encounter: Payer: Self-pay | Admitting: Nurse Practitioner

## 2024-08-27 DIAGNOSIS — M25472 Effusion, left ankle: Secondary | ICD-10-CM | POA: Insufficient documentation

## 2024-08-27 DIAGNOSIS — J309 Allergic rhinitis, unspecified: Secondary | ICD-10-CM | POA: Insufficient documentation

## 2024-08-27 NOTE — Assessment & Plan Note (Signed)
 Managed with Crestor  20 mg daily. Continue.

## 2024-08-27 NOTE — Assessment & Plan Note (Signed)
 Hypertension is well-controlled with current medications. Home and office blood pressure readings average 120/70 mmHg. Continue Coreg , Spironolactone  and Norvasc  as prescribed.

## 2024-08-27 NOTE — Assessment & Plan Note (Signed)
 Left Achilles pain and swelling may be related to a past Achilles tendon injury, with tendinitis or other inflammatory processes considered. Trial MDP to aid in pain relief and refer to an orthopedic specialist for further evaluation. Encourage alternating ice and heat as needed.

## 2024-08-27 NOTE — Assessment & Plan Note (Signed)
 Hx of left achilles tendon repair. Limping. Pain and swelling noted along achilles. Prescribe MDP and refer to ortho for further evaluation.

## 2024-08-27 NOTE — Assessment & Plan Note (Signed)
 Suspected seasonal allergic rhinitis presents with a scratchy throat, likely due to weather changes. Recommend antihistamines such as Zyrtec or Claritin and suggest nasal spray like Flonase for symptom relief. Return precautions given to patient.

## 2024-08-27 NOTE — Assessment & Plan Note (Signed)
 Pain is adequately controlled with Gabapentin  and Tizanidine . Continue current management. Refills provided.

## 2024-09-16 ENCOUNTER — Other Ambulatory Visit: Payer: Self-pay

## 2024-09-16 DIAGNOSIS — Z9889 Other specified postprocedural states: Secondary | ICD-10-CM | POA: Diagnosis not present

## 2024-09-16 DIAGNOSIS — M25572 Pain in left ankle and joints of left foot: Secondary | ICD-10-CM | POA: Diagnosis not present

## 2024-09-16 DIAGNOSIS — M79672 Pain in left foot: Secondary | ICD-10-CM | POA: Diagnosis not present

## 2024-09-19 ENCOUNTER — Ambulatory Visit: Admission: RE | Admit: 2024-09-19 | Discharge: 2024-09-19 | Disposition: A | Source: Ambulatory Visit

## 2024-09-19 DIAGNOSIS — S93402A Sprain of unspecified ligament of left ankle, initial encounter: Secondary | ICD-10-CM | POA: Diagnosis not present

## 2024-09-19 DIAGNOSIS — M25572 Pain in left ankle and joints of left foot: Secondary | ICD-10-CM

## 2024-09-19 DIAGNOSIS — Z9889 Other specified postprocedural states: Secondary | ICD-10-CM

## 2024-10-12 DIAGNOSIS — S86012D Strain of left Achilles tendon, subsequent encounter: Secondary | ICD-10-CM | POA: Diagnosis not present

## 2024-10-12 DIAGNOSIS — M25572 Pain in left ankle and joints of left foot: Secondary | ICD-10-CM | POA: Diagnosis not present

## 2024-10-13 ENCOUNTER — Telehealth: Payer: Self-pay

## 2024-10-13 DIAGNOSIS — Z1231 Encounter for screening mammogram for malignant neoplasm of breast: Secondary | ICD-10-CM

## 2024-10-13 NOTE — Addendum Note (Signed)
 Addended by: ORLANDO KINGDOM on: 10/13/2024 01:32 PM   Modules accepted: Orders

## 2024-10-13 NOTE — Telephone Encounter (Signed)
 Order placed and pt informed.

## 2024-10-13 NOTE — Telephone Encounter (Signed)
 Copied from CRM [id number]. Topic: Appointments - Scheduling Inquiry for Clinic >> Oct 13, 2024 11:10 AM Maria Hamilton wrote: Reason for CRM: Patient called in regarding needing a referral for mammogram at  [facility] [phone]

## 2024-10-23 ENCOUNTER — Telehealth: Payer: Self-pay

## 2024-10-23 NOTE — Telephone Encounter (Signed)
 E faxed received for a refill request on pts potassium okay to fill or would you like a new lab done prior?    Last normal lab 06-11-24 LOV: 08-11-24  Only 90 day supply sent with 0 refills last fill

## 2024-10-26 NOTE — Telephone Encounter (Signed)
 Called and someone answered stating pt is outside asked to have pt cb

## 2024-10-28 ENCOUNTER — Other Ambulatory Visit: Payer: Self-pay

## 2024-10-28 MED ORDER — AMLODIPINE BESYLATE 10 MG PO TABS: 10.0000 mg | ORAL_TABLET | Freq: Every day | ORAL | 3 refills | Status: DC

## 2024-10-30 ENCOUNTER — Other Ambulatory Visit: Payer: Self-pay

## 2024-10-30 DIAGNOSIS — I10 Essential (primary) hypertension: Secondary | ICD-10-CM

## 2024-10-30 MED ORDER — CARVEDILOL 12.5 MG PO TABS: 12.5000 mg | ORAL_TABLET | Freq: Two times a day (BID) | ORAL | 3 refills | Status: AC

## 2024-10-30 MED ORDER — AMLODIPINE BESYLATE 10 MG PO TABS
10.0000 mg | ORAL_TABLET | Freq: Every day | ORAL | 3 refills | Status: AC
Start: 2024-10-30 — End: ?

## 2024-11-20 ENCOUNTER — Ambulatory Visit
Admission: RE | Admit: 2024-11-20 | Discharge: 2024-11-20 | Disposition: A | Source: Ambulatory Visit | Attending: Nurse Practitioner

## 2024-11-20 DIAGNOSIS — Z1231 Encounter for screening mammogram for malignant neoplasm of breast: Secondary | ICD-10-CM | POA: Diagnosis not present

## 2024-11-30 ENCOUNTER — Ambulatory Visit: Payer: Self-pay | Admitting: Nurse Practitioner

## 2025-01-04 ENCOUNTER — Other Ambulatory Visit: Payer: Self-pay | Admitting: Nurse Practitioner

## 2025-01-04 DIAGNOSIS — G8929 Other chronic pain: Secondary | ICD-10-CM

## 2025-01-04 DIAGNOSIS — I10 Essential (primary) hypertension: Secondary | ICD-10-CM

## 2025-01-04 DIAGNOSIS — G894 Chronic pain syndrome: Secondary | ICD-10-CM

## 2025-01-05 ENCOUNTER — Telehealth: Payer: Self-pay

## 2025-01-05 NOTE — Telephone Encounter (Signed)
 Detailed vm left informing pt to CB to schedule lab and ov with pcp

## 2025-01-05 NOTE — Telephone Encounter (Signed)
 Called and left a detailed vm asking pt to CB to schedule a lab appt and an OV with PCP per PCP's request :  Gretel App, NP to Me (Selected Message)     01/04/25  3:02 PM Yes, she needs an appointment with me also.  Me to Gretel App, NP     01/04/25 11:48 AM Would you like pt to have a potassium lab drawn prior to refill?     E2C2 PLEASE HAVE PT SCHEDULED A LAB APPT AS WELL AS AN OV WITH PCP FOR FOLLOW UP

## 2025-04-26 ENCOUNTER — Ambulatory Visit
